# Patient Record
Sex: Female | Born: 1935
Health system: Southern US, Community
[De-identification: ages and names within clinical notes are randomized; demographics above are authoritative.]

## PROBLEM LIST (undated history)

## (undated) DIAGNOSIS — R4182 Altered mental status, unspecified: Principal | ICD-10-CM

## (undated) DIAGNOSIS — E119 Type 2 diabetes mellitus without complications: Secondary | ICD-10-CM

## (undated) DIAGNOSIS — R609 Edema, unspecified: Secondary | ICD-10-CM

## (undated) DIAGNOSIS — I442 Atrioventricular block, complete: Secondary | ICD-10-CM

## (undated) DIAGNOSIS — I639 Cerebral infarction, unspecified: Secondary | ICD-10-CM

## (undated) DIAGNOSIS — M1610 Unilateral primary osteoarthritis, unspecified hip: Secondary | ICD-10-CM

## (undated) DIAGNOSIS — F411 Generalized anxiety disorder: Secondary | ICD-10-CM

## (undated) DIAGNOSIS — R269 Unspecified abnormalities of gait and mobility: Secondary | ICD-10-CM

## (undated) DIAGNOSIS — Z950 Presence of cardiac pacemaker: Secondary | ICD-10-CM

## (undated) DIAGNOSIS — H9319 Tinnitus, unspecified ear: Secondary | ICD-10-CM

## (undated) DIAGNOSIS — N281 Cyst of kidney, acquired: Secondary | ICD-10-CM

## (undated) DIAGNOSIS — T18128A Food in esophagus causing other injury, initial encounter: Secondary | ICD-10-CM

## (undated) DIAGNOSIS — J301 Allergic rhinitis due to pollen: Secondary | ICD-10-CM

## (undated) DIAGNOSIS — E559 Vitamin D deficiency, unspecified: Secondary | ICD-10-CM

## (undated) DIAGNOSIS — M810 Age-related osteoporosis without current pathological fracture: Secondary | ICD-10-CM

## (undated) DIAGNOSIS — I10 Essential (primary) hypertension: Secondary | ICD-10-CM

## (undated) DIAGNOSIS — R300 Dysuria: Secondary | ICD-10-CM

## (undated) DIAGNOSIS — H3530 Unspecified macular degeneration: Secondary | ICD-10-CM

## (undated) DIAGNOSIS — E039 Hypothyroidism, unspecified: Secondary | ICD-10-CM

## (undated) DIAGNOSIS — M1990 Unspecified osteoarthritis, unspecified site: Secondary | ICD-10-CM

## (undated) DIAGNOSIS — E781 Pure hyperglyceridemia: Secondary | ICD-10-CM

## (undated) DIAGNOSIS — C50919 Malignant neoplasm of unspecified site of unspecified female breast: Secondary | ICD-10-CM

## (undated) DIAGNOSIS — Z860100 Personal history of colon polyps, unspecified: Secondary | ICD-10-CM

## (undated) DIAGNOSIS — I214 Non-ST elevation (NSTEMI) myocardial infarction: Secondary | ICD-10-CM

## (undated) DIAGNOSIS — B0052 Herpesviral keratitis: Secondary | ICD-10-CM

## (undated) DIAGNOSIS — H409 Unspecified glaucoma: Secondary | ICD-10-CM

## (undated) DIAGNOSIS — I2510 Atherosclerotic heart disease of native coronary artery without angina pectoris: Secondary | ICD-10-CM

## (undated) DIAGNOSIS — K224 Dyskinesia of esophagus: Secondary | ICD-10-CM

## (undated) DIAGNOSIS — R339 Retention of urine, unspecified: Secondary | ICD-10-CM

## (undated) DIAGNOSIS — J019 Acute sinusitis, unspecified: Secondary | ICD-10-CM

## (undated) HISTORY — DX: Acute sinusitis, unspecified: J01.90

## (undated) HISTORY — DX: Food in esophagus causing other injury, initial encounter: T18.128A

## (undated) HISTORY — DX: Unspecified abnormalities of gait and mobility: R26.9

## (undated) HISTORY — PX: INSERT / REPLACE / REMOVE PACEMAKER: SUR710

## (undated) HISTORY — DX: Personal history of colonic polyps: Z86.010

## (undated) HISTORY — DX: Unspecified macular degeneration: H35.30

## (undated) HISTORY — DX: Edema, unspecified: R60.9

## (undated) HISTORY — DX: Personal history of colon polyps, unspecified: Z86.0100

## (undated) HISTORY — DX: Type 2 diabetes mellitus without complications: E11.9

## (undated) HISTORY — DX: Cerebral infarction, unspecified: I63.9

## (undated) HISTORY — DX: Cyst of kidney, acquired: N28.1

## (undated) HISTORY — DX: Atherosclerotic heart disease of native coronary artery without angina pectoris: I25.10

## (undated) HISTORY — DX: Allergic rhinitis due to pollen: J30.1

## (undated) HISTORY — DX: Unspecified glaucoma: H40.9

## (undated) HISTORY — DX: Non-ST elevation (NSTEMI) myocardial infarction: I21.4

## (undated) HISTORY — DX: Age-related osteoporosis without current pathological fracture: M81.0

## (undated) HISTORY — DX: Presence of cardiac pacemaker: Z95.0

## (undated) HISTORY — DX: Hypothyroidism, unspecified: E03.9

## (undated) HISTORY — DX: Retention of urine, unspecified: R33.9

## (undated) HISTORY — DX: Dysuria: R30.0

## (undated) HISTORY — DX: Herpesviral keratitis: B00.52

## (undated) HISTORY — DX: Unspecified osteoarthritis, unspecified site: M19.90

## (undated) HISTORY — DX: Malignant neoplasm of unspecified site of unspecified female breast: C50.919

## (undated) HISTORY — DX: Dyskinesia of esophagus: K22.4

## (undated) HISTORY — DX: Pure hyperglyceridemia: E78.1

## (undated) HISTORY — DX: Essential (primary) hypertension: I10

## (undated) HISTORY — DX: Atrioventricular block, complete: I44.2

## (undated) HISTORY — DX: Unilateral primary osteoarthritis, unspecified hip: M16.10

## (undated) HISTORY — DX: Tinnitus, unspecified ear: H93.19

## (undated) HISTORY — DX: Generalized anxiety disorder: F41.1

## (undated) HISTORY — DX: Altered mental status, unspecified: R41.82

## (undated) HISTORY — DX: Vitamin D deficiency, unspecified: E55.9

---

## 1968-03-05 DIAGNOSIS — C50919 Malignant neoplasm of unspecified site of unspecified female breast: Secondary | ICD-10-CM

## 1968-03-05 HISTORY — PX: MASTECTOMY: SHX3

## 1968-03-05 HISTORY — DX: Malignant neoplasm of unspecified site of unspecified female breast: C50.919

## 2000-03-05 DIAGNOSIS — N281 Cyst of kidney, acquired: Secondary | ICD-10-CM

## 2000-03-05 HISTORY — DX: Cyst of kidney, acquired: N28.1

## 2001-03-05 DIAGNOSIS — I2510 Atherosclerotic heart disease of native coronary artery without angina pectoris: Secondary | ICD-10-CM

## 2001-03-05 DIAGNOSIS — R609 Edema, unspecified: Secondary | ICD-10-CM

## 2001-03-05 DIAGNOSIS — I442 Atrioventricular block, complete: Secondary | ICD-10-CM

## 2001-03-05 DIAGNOSIS — Z950 Presence of cardiac pacemaker: Secondary | ICD-10-CM

## 2001-03-05 DIAGNOSIS — M810 Age-related osteoporosis without current pathological fracture: Secondary | ICD-10-CM

## 2001-03-05 DIAGNOSIS — E119 Type 2 diabetes mellitus without complications: Secondary | ICD-10-CM

## 2001-03-05 HISTORY — DX: Presence of cardiac pacemaker: Z95.0

## 2001-03-05 HISTORY — DX: Type 2 diabetes mellitus without complications: E11.9

## 2001-03-05 HISTORY — DX: Age-related osteoporosis without current pathological fracture: M81.0

## 2001-03-05 HISTORY — DX: Edema, unspecified: R60.9

## 2001-03-05 HISTORY — DX: Atherosclerotic heart disease of native coronary artery without angina pectoris: I25.10

## 2001-03-05 HISTORY — DX: Atrioventricular block, complete: I44.2

## 2001-09-02 ENCOUNTER — Encounter: Payer: Self-pay | Admitting: *Deleted

## 2001-09-02 ENCOUNTER — Emergency Department (HOSPITAL_COMMUNITY): Admission: EM | Admit: 2001-09-02 | Discharge: 2001-09-02 | Payer: Self-pay | Admitting: *Deleted

## 2002-03-05 DIAGNOSIS — E781 Pure hyperglyceridemia: Secondary | ICD-10-CM

## 2002-03-05 HISTORY — DX: Pure hyperglyceridemia: E78.1

## 2005-03-05 DIAGNOSIS — H9319 Tinnitus, unspecified ear: Secondary | ICD-10-CM

## 2005-03-05 DIAGNOSIS — R269 Unspecified abnormalities of gait and mobility: Secondary | ICD-10-CM

## 2005-03-05 HISTORY — DX: Tinnitus, unspecified ear: H93.19

## 2005-03-05 HISTORY — DX: Unspecified abnormalities of gait and mobility: R26.9

## 2007-03-06 DIAGNOSIS — E559 Vitamin D deficiency, unspecified: Secondary | ICD-10-CM

## 2007-03-06 DIAGNOSIS — E039 Hypothyroidism, unspecified: Secondary | ICD-10-CM

## 2007-03-06 HISTORY — DX: Vitamin D deficiency, unspecified: E55.9

## 2007-03-06 HISTORY — DX: Hypothyroidism, unspecified: E03.9

## 2008-03-05 DIAGNOSIS — M1610 Unilateral primary osteoarthritis, unspecified hip: Secondary | ICD-10-CM

## 2008-03-05 HISTORY — DX: Unilateral primary osteoarthritis, unspecified hip: M16.10

## 2008-04-01 ENCOUNTER — Encounter: Admission: RE | Admit: 2008-04-01 | Discharge: 2008-04-01 | Payer: Self-pay | Admitting: Internal Medicine

## 2009-07-08 ENCOUNTER — Ambulatory Visit (HOSPITAL_COMMUNITY): Admission: RE | Admit: 2009-07-08 | Discharge: 2009-07-08 | Payer: Self-pay | Admitting: Cardiology

## 2010-03-05 DIAGNOSIS — H3530 Unspecified macular degeneration: Secondary | ICD-10-CM

## 2010-03-05 DIAGNOSIS — B0052 Herpesviral keratitis: Secondary | ICD-10-CM

## 2010-03-05 HISTORY — DX: Unspecified macular degeneration: H35.30

## 2010-03-05 HISTORY — DX: Herpesviral keratitis: B00.52

## 2010-05-23 LAB — SURGICAL PCR SCREEN
MRSA, PCR: NEGATIVE
Staphylococcus aureus: POSITIVE — AB

## 2010-05-23 LAB — GLUCOSE, CAPILLARY
Glucose-Capillary: 123 mg/dL — ABNORMAL HIGH (ref 70–99)
Glucose-Capillary: 128 mg/dL — ABNORMAL HIGH (ref 70–99)

## 2011-03-06 DIAGNOSIS — M1990 Unspecified osteoarthritis, unspecified site: Secondary | ICD-10-CM

## 2011-03-06 DIAGNOSIS — H409 Unspecified glaucoma: Secondary | ICD-10-CM

## 2011-03-06 HISTORY — DX: Unspecified glaucoma: H40.9

## 2011-03-06 HISTORY — DX: Unspecified osteoarthritis, unspecified site: M19.90

## 2011-03-21 DIAGNOSIS — B09 Unspecified viral infection characterized by skin and mucous membrane lesions: Secondary | ICD-10-CM | POA: Diagnosis not present

## 2011-03-21 DIAGNOSIS — H3530 Unspecified macular degeneration: Secondary | ICD-10-CM | POA: Diagnosis not present

## 2011-03-21 DIAGNOSIS — H04209 Unspecified epiphora, unspecified lacrimal gland: Secondary | ICD-10-CM | POA: Diagnosis not present

## 2011-04-26 DIAGNOSIS — I10 Essential (primary) hypertension: Secondary | ICD-10-CM | POA: Diagnosis not present

## 2011-04-26 DIAGNOSIS — I2510 Atherosclerotic heart disease of native coronary artery without angina pectoris: Secondary | ICD-10-CM | POA: Diagnosis not present

## 2011-04-26 DIAGNOSIS — Z79899 Other long term (current) drug therapy: Secondary | ICD-10-CM | POA: Diagnosis not present

## 2011-04-26 DIAGNOSIS — R609 Edema, unspecified: Secondary | ICD-10-CM | POA: Diagnosis not present

## 2011-04-26 DIAGNOSIS — E119 Type 2 diabetes mellitus without complications: Secondary | ICD-10-CM | POA: Diagnosis not present

## 2011-05-02 DIAGNOSIS — I10 Essential (primary) hypertension: Secondary | ICD-10-CM | POA: Diagnosis not present

## 2011-05-02 DIAGNOSIS — R5381 Other malaise: Secondary | ICD-10-CM | POA: Diagnosis not present

## 2011-05-02 DIAGNOSIS — E119 Type 2 diabetes mellitus without complications: Secondary | ICD-10-CM | POA: Diagnosis not present

## 2011-05-02 DIAGNOSIS — F411 Generalized anxiety disorder: Secondary | ICD-10-CM | POA: Diagnosis not present

## 2011-05-14 DIAGNOSIS — Z950 Presence of cardiac pacemaker: Secondary | ICD-10-CM | POA: Diagnosis not present

## 2011-05-14 DIAGNOSIS — I495 Sick sinus syndrome: Secondary | ICD-10-CM | POA: Diagnosis not present

## 2011-05-29 DIAGNOSIS — M2010 Hallux valgus (acquired), unspecified foot: Secondary | ICD-10-CM | POA: Diagnosis not present

## 2011-05-29 DIAGNOSIS — M2040 Other hammer toe(s) (acquired), unspecified foot: Secondary | ICD-10-CM | POA: Diagnosis not present

## 2011-06-05 DIAGNOSIS — Z1231 Encounter for screening mammogram for malignant neoplasm of breast: Secondary | ICD-10-CM | POA: Diagnosis not present

## 2011-06-19 DIAGNOSIS — H04569 Stenosis of unspecified lacrimal punctum: Secondary | ICD-10-CM | POA: Diagnosis not present

## 2011-06-19 DIAGNOSIS — H04562 Stenosis of left lacrimal punctum: Secondary | ICD-10-CM | POA: Insufficient documentation

## 2011-06-19 DIAGNOSIS — Z7982 Long term (current) use of aspirin: Secondary | ICD-10-CM | POA: Diagnosis not present

## 2011-06-19 DIAGNOSIS — H3530 Unspecified macular degeneration: Secondary | ICD-10-CM | POA: Diagnosis not present

## 2011-06-19 DIAGNOSIS — Z79899 Other long term (current) drug therapy: Secondary | ICD-10-CM | POA: Diagnosis not present

## 2011-06-19 DIAGNOSIS — H409 Unspecified glaucoma: Secondary | ICD-10-CM | POA: Diagnosis not present

## 2011-06-20 DIAGNOSIS — H353190 Nonexudative age-related macular degeneration, unspecified eye, stage unspecified: Secondary | ICD-10-CM | POA: Diagnosis not present

## 2011-06-20 DIAGNOSIS — H401490 Capsular glaucoma with pseudoexfoliation of lens, unspecified eye, stage unspecified: Secondary | ICD-10-CM | POA: Diagnosis not present

## 2011-06-20 DIAGNOSIS — Z961 Presence of intraocular lens: Secondary | ICD-10-CM | POA: Diagnosis not present

## 2011-07-26 DIAGNOSIS — E119 Type 2 diabetes mellitus without complications: Secondary | ICD-10-CM | POA: Diagnosis not present

## 2011-07-26 DIAGNOSIS — E785 Hyperlipidemia, unspecified: Secondary | ICD-10-CM | POA: Diagnosis not present

## 2011-08-01 DIAGNOSIS — R5381 Other malaise: Secondary | ICD-10-CM | POA: Diagnosis not present

## 2011-08-01 DIAGNOSIS — Z853 Personal history of malignant neoplasm of breast: Secondary | ICD-10-CM | POA: Diagnosis not present

## 2011-08-01 DIAGNOSIS — E119 Type 2 diabetes mellitus without complications: Secondary | ICD-10-CM | POA: Diagnosis not present

## 2011-08-01 DIAGNOSIS — F411 Generalized anxiety disorder: Secondary | ICD-10-CM | POA: Diagnosis not present

## 2011-08-01 DIAGNOSIS — E781 Pure hyperglyceridemia: Secondary | ICD-10-CM | POA: Diagnosis not present

## 2011-08-01 DIAGNOSIS — Z86010 Personal history of colonic polyps: Secondary | ICD-10-CM | POA: Diagnosis not present

## 2011-09-10 DIAGNOSIS — Z950 Presence of cardiac pacemaker: Secondary | ICD-10-CM | POA: Diagnosis not present

## 2011-09-11 DIAGNOSIS — B351 Tinea unguium: Secondary | ICD-10-CM | POA: Diagnosis not present

## 2011-09-11 DIAGNOSIS — E1159 Type 2 diabetes mellitus with other circulatory complications: Secondary | ICD-10-CM | POA: Diagnosis not present

## 2011-09-11 DIAGNOSIS — L851 Acquired keratosis [keratoderma] palmaris et plantaris: Secondary | ICD-10-CM | POA: Diagnosis not present

## 2011-10-23 DIAGNOSIS — E039 Hypothyroidism, unspecified: Secondary | ICD-10-CM | POA: Diagnosis not present

## 2011-10-23 DIAGNOSIS — E119 Type 2 diabetes mellitus without complications: Secondary | ICD-10-CM | POA: Diagnosis not present

## 2011-10-23 DIAGNOSIS — I10 Essential (primary) hypertension: Secondary | ICD-10-CM | POA: Diagnosis not present

## 2011-10-31 DIAGNOSIS — E119 Type 2 diabetes mellitus without complications: Secondary | ICD-10-CM | POA: Diagnosis not present

## 2011-10-31 DIAGNOSIS — M2550 Pain in unspecified joint: Secondary | ICD-10-CM | POA: Diagnosis not present

## 2011-10-31 DIAGNOSIS — F411 Generalized anxiety disorder: Secondary | ICD-10-CM | POA: Diagnosis not present

## 2011-10-31 DIAGNOSIS — I10 Essential (primary) hypertension: Secondary | ICD-10-CM | POA: Diagnosis not present

## 2011-11-02 ENCOUNTER — Ambulatory Visit
Admission: RE | Admit: 2011-11-02 | Discharge: 2011-11-02 | Disposition: A | Discharge status: Home / Self Care | Payer: Medicare Other | Source: Ambulatory Visit | Attending: Geriatric Medicine | Admitting: Geriatric Medicine

## 2011-11-02 ENCOUNTER — Other Ambulatory Visit: Payer: Self-pay | Admitting: Geriatric Medicine

## 2011-11-02 DIAGNOSIS — M1710 Unilateral primary osteoarthritis, unspecified knee: Secondary | ICD-10-CM | POA: Diagnosis not present

## 2011-11-02 DIAGNOSIS — M25562 Pain in left knee: Secondary | ICD-10-CM

## 2011-11-13 DIAGNOSIS — L608 Other nail disorders: Secondary | ICD-10-CM | POA: Diagnosis not present

## 2011-11-13 DIAGNOSIS — L851 Acquired keratosis [keratoderma] palmaris et plantaris: Secondary | ICD-10-CM | POA: Diagnosis not present

## 2011-11-13 DIAGNOSIS — E1159 Type 2 diabetes mellitus with other circulatory complications: Secondary | ICD-10-CM | POA: Diagnosis not present

## 2011-11-20 DIAGNOSIS — H401490 Capsular glaucoma with pseudoexfoliation of lens, unspecified eye, stage unspecified: Secondary | ICD-10-CM | POA: Diagnosis not present

## 2011-11-20 DIAGNOSIS — Z961 Presence of intraocular lens: Secondary | ICD-10-CM | POA: Diagnosis not present

## 2011-11-20 DIAGNOSIS — H353190 Nonexudative age-related macular degeneration, unspecified eye, stage unspecified: Secondary | ICD-10-CM | POA: Diagnosis not present

## 2011-11-27 DIAGNOSIS — R279 Unspecified lack of coordination: Secondary | ICD-10-CM | POA: Diagnosis not present

## 2011-11-27 DIAGNOSIS — M6281 Muscle weakness (generalized): Secondary | ICD-10-CM | POA: Diagnosis not present

## 2011-11-27 DIAGNOSIS — M159 Polyosteoarthritis, unspecified: Secondary | ICD-10-CM | POA: Diagnosis not present

## 2011-11-27 DIAGNOSIS — R269 Unspecified abnormalities of gait and mobility: Secondary | ICD-10-CM | POA: Diagnosis not present

## 2011-11-30 DIAGNOSIS — R279 Unspecified lack of coordination: Secondary | ICD-10-CM | POA: Diagnosis not present

## 2011-11-30 DIAGNOSIS — M159 Polyosteoarthritis, unspecified: Secondary | ICD-10-CM | POA: Diagnosis not present

## 2011-11-30 DIAGNOSIS — R269 Unspecified abnormalities of gait and mobility: Secondary | ICD-10-CM | POA: Diagnosis not present

## 2011-11-30 DIAGNOSIS — M6281 Muscle weakness (generalized): Secondary | ICD-10-CM | POA: Diagnosis not present

## 2011-12-03 DIAGNOSIS — R279 Unspecified lack of coordination: Secondary | ICD-10-CM | POA: Diagnosis not present

## 2011-12-03 DIAGNOSIS — M6281 Muscle weakness (generalized): Secondary | ICD-10-CM | POA: Diagnosis not present

## 2011-12-03 DIAGNOSIS — M159 Polyosteoarthritis, unspecified: Secondary | ICD-10-CM | POA: Diagnosis not present

## 2011-12-03 DIAGNOSIS — R269 Unspecified abnormalities of gait and mobility: Secondary | ICD-10-CM | POA: Diagnosis not present

## 2011-12-04 DIAGNOSIS — R279 Unspecified lack of coordination: Secondary | ICD-10-CM | POA: Diagnosis not present

## 2011-12-04 DIAGNOSIS — M159 Polyosteoarthritis, unspecified: Secondary | ICD-10-CM | POA: Diagnosis not present

## 2011-12-04 DIAGNOSIS — R269 Unspecified abnormalities of gait and mobility: Secondary | ICD-10-CM | POA: Diagnosis not present

## 2011-12-04 DIAGNOSIS — M6281 Muscle weakness (generalized): Secondary | ICD-10-CM | POA: Diagnosis not present

## 2011-12-06 DIAGNOSIS — R269 Unspecified abnormalities of gait and mobility: Secondary | ICD-10-CM | POA: Diagnosis not present

## 2011-12-06 DIAGNOSIS — M6281 Muscle weakness (generalized): Secondary | ICD-10-CM | POA: Diagnosis not present

## 2011-12-06 DIAGNOSIS — R279 Unspecified lack of coordination: Secondary | ICD-10-CM | POA: Diagnosis not present

## 2011-12-06 DIAGNOSIS — M159 Polyosteoarthritis, unspecified: Secondary | ICD-10-CM | POA: Diagnosis not present

## 2011-12-10 DIAGNOSIS — M6281 Muscle weakness (generalized): Secondary | ICD-10-CM | POA: Diagnosis not present

## 2011-12-10 DIAGNOSIS — R279 Unspecified lack of coordination: Secondary | ICD-10-CM | POA: Diagnosis not present

## 2011-12-10 DIAGNOSIS — M159 Polyosteoarthritis, unspecified: Secondary | ICD-10-CM | POA: Diagnosis not present

## 2011-12-10 DIAGNOSIS — R269 Unspecified abnormalities of gait and mobility: Secondary | ICD-10-CM | POA: Diagnosis not present

## 2011-12-11 DIAGNOSIS — I442 Atrioventricular block, complete: Secondary | ICD-10-CM | POA: Diagnosis not present

## 2011-12-11 DIAGNOSIS — I10 Essential (primary) hypertension: Secondary | ICD-10-CM | POA: Diagnosis not present

## 2011-12-11 DIAGNOSIS — R5381 Other malaise: Secondary | ICD-10-CM | POA: Diagnosis not present

## 2011-12-11 DIAGNOSIS — Z950 Presence of cardiac pacemaker: Secondary | ICD-10-CM | POA: Diagnosis not present

## 2011-12-11 DIAGNOSIS — R5383 Other fatigue: Secondary | ICD-10-CM | POA: Diagnosis not present

## 2011-12-11 DIAGNOSIS — E7800 Pure hypercholesterolemia, unspecified: Secondary | ICD-10-CM | POA: Diagnosis not present

## 2011-12-12 DIAGNOSIS — M6281 Muscle weakness (generalized): Secondary | ICD-10-CM | POA: Diagnosis not present

## 2011-12-12 DIAGNOSIS — M159 Polyosteoarthritis, unspecified: Secondary | ICD-10-CM | POA: Diagnosis not present

## 2011-12-12 DIAGNOSIS — R279 Unspecified lack of coordination: Secondary | ICD-10-CM | POA: Diagnosis not present

## 2011-12-12 DIAGNOSIS — R269 Unspecified abnormalities of gait and mobility: Secondary | ICD-10-CM | POA: Diagnosis not present

## 2011-12-14 DIAGNOSIS — M159 Polyosteoarthritis, unspecified: Secondary | ICD-10-CM | POA: Diagnosis not present

## 2011-12-14 DIAGNOSIS — R279 Unspecified lack of coordination: Secondary | ICD-10-CM | POA: Diagnosis not present

## 2011-12-14 DIAGNOSIS — M6281 Muscle weakness (generalized): Secondary | ICD-10-CM | POA: Diagnosis not present

## 2011-12-14 DIAGNOSIS — R269 Unspecified abnormalities of gait and mobility: Secondary | ICD-10-CM | POA: Diagnosis not present

## 2011-12-17 DIAGNOSIS — Z950 Presence of cardiac pacemaker: Secondary | ICD-10-CM | POA: Diagnosis not present

## 2011-12-17 DIAGNOSIS — I495 Sick sinus syndrome: Secondary | ICD-10-CM | POA: Diagnosis not present

## 2011-12-18 DIAGNOSIS — Z23 Encounter for immunization: Secondary | ICD-10-CM | POA: Diagnosis not present

## 2011-12-19 DIAGNOSIS — M159 Polyosteoarthritis, unspecified: Secondary | ICD-10-CM | POA: Diagnosis not present

## 2011-12-19 DIAGNOSIS — M6281 Muscle weakness (generalized): Secondary | ICD-10-CM | POA: Diagnosis not present

## 2011-12-19 DIAGNOSIS — R269 Unspecified abnormalities of gait and mobility: Secondary | ICD-10-CM | POA: Diagnosis not present

## 2011-12-19 DIAGNOSIS — R279 Unspecified lack of coordination: Secondary | ICD-10-CM | POA: Diagnosis not present

## 2011-12-21 DIAGNOSIS — H903 Sensorineural hearing loss, bilateral: Secondary | ICD-10-CM | POA: Diagnosis not present

## 2012-01-15 DIAGNOSIS — L608 Other nail disorders: Secondary | ICD-10-CM | POA: Diagnosis not present

## 2012-01-15 DIAGNOSIS — E1159 Type 2 diabetes mellitus with other circulatory complications: Secondary | ICD-10-CM | POA: Diagnosis not present

## 2012-01-15 DIAGNOSIS — L851 Acquired keratosis [keratoderma] palmaris et plantaris: Secondary | ICD-10-CM | POA: Diagnosis not present

## 2012-01-22 DIAGNOSIS — E039 Hypothyroidism, unspecified: Secondary | ICD-10-CM | POA: Diagnosis not present

## 2012-01-22 DIAGNOSIS — E119 Type 2 diabetes mellitus without complications: Secondary | ICD-10-CM | POA: Diagnosis not present

## 2012-01-30 DIAGNOSIS — E039 Hypothyroidism, unspecified: Secondary | ICD-10-CM | POA: Diagnosis not present

## 2012-01-30 DIAGNOSIS — E785 Hyperlipidemia, unspecified: Secondary | ICD-10-CM | POA: Diagnosis not present

## 2012-01-30 DIAGNOSIS — M1990 Unspecified osteoarthritis, unspecified site: Secondary | ICD-10-CM | POA: Diagnosis not present

## 2012-01-30 DIAGNOSIS — E119 Type 2 diabetes mellitus without complications: Secondary | ICD-10-CM | POA: Diagnosis not present

## 2012-03-18 DIAGNOSIS — E1159 Type 2 diabetes mellitus with other circulatory complications: Secondary | ICD-10-CM | POA: Diagnosis not present

## 2012-03-18 DIAGNOSIS — L608 Other nail disorders: Secondary | ICD-10-CM | POA: Diagnosis not present

## 2012-03-18 DIAGNOSIS — L851 Acquired keratosis [keratoderma] palmaris et plantaris: Secondary | ICD-10-CM | POA: Diagnosis not present

## 2012-04-08 DIAGNOSIS — Z950 Presence of cardiac pacemaker: Secondary | ICD-10-CM | POA: Diagnosis not present

## 2012-04-15 DIAGNOSIS — F411 Generalized anxiety disorder: Secondary | ICD-10-CM | POA: Diagnosis not present

## 2012-04-15 DIAGNOSIS — E119 Type 2 diabetes mellitus without complications: Secondary | ICD-10-CM | POA: Diagnosis not present

## 2012-04-15 DIAGNOSIS — E039 Hypothyroidism, unspecified: Secondary | ICD-10-CM | POA: Diagnosis not present

## 2012-04-15 DIAGNOSIS — Z79899 Other long term (current) drug therapy: Secondary | ICD-10-CM | POA: Diagnosis not present

## 2012-04-15 DIAGNOSIS — I10 Essential (primary) hypertension: Secondary | ICD-10-CM | POA: Diagnosis not present

## 2012-04-30 DIAGNOSIS — E119 Type 2 diabetes mellitus without complications: Secondary | ICD-10-CM | POA: Diagnosis not present

## 2012-04-30 DIAGNOSIS — E781 Pure hyperglyceridemia: Secondary | ICD-10-CM | POA: Diagnosis not present

## 2012-04-30 DIAGNOSIS — I10 Essential (primary) hypertension: Secondary | ICD-10-CM | POA: Diagnosis not present

## 2012-04-30 DIAGNOSIS — M1990 Unspecified osteoarthritis, unspecified site: Secondary | ICD-10-CM | POA: Diagnosis not present

## 2012-04-30 DIAGNOSIS — E039 Hypothyroidism, unspecified: Secondary | ICD-10-CM | POA: Diagnosis not present

## 2012-05-20 DIAGNOSIS — L608 Other nail disorders: Secondary | ICD-10-CM | POA: Diagnosis not present

## 2012-05-20 DIAGNOSIS — E1159 Type 2 diabetes mellitus with other circulatory complications: Secondary | ICD-10-CM | POA: Diagnosis not present

## 2012-05-20 DIAGNOSIS — L851 Acquired keratosis [keratoderma] palmaris et plantaris: Secondary | ICD-10-CM | POA: Diagnosis not present

## 2012-05-28 DIAGNOSIS — H353190 Nonexudative age-related macular degeneration, unspecified eye, stage unspecified: Secondary | ICD-10-CM | POA: Diagnosis not present

## 2012-05-28 DIAGNOSIS — Z961 Presence of intraocular lens: Secondary | ICD-10-CM | POA: Diagnosis not present

## 2012-06-26 DIAGNOSIS — H353190 Nonexudative age-related macular degeneration, unspecified eye, stage unspecified: Secondary | ICD-10-CM | POA: Diagnosis not present

## 2012-06-26 DIAGNOSIS — H33009 Unspecified retinal detachment with retinal break, unspecified eye: Secondary | ICD-10-CM | POA: Diagnosis not present

## 2012-06-26 DIAGNOSIS — H43819 Vitreous degeneration, unspecified eye: Secondary | ICD-10-CM | POA: Diagnosis not present

## 2012-06-28 ENCOUNTER — Encounter (HOSPITAL_COMMUNITY): Payer: Self-pay | Admitting: Family Medicine

## 2012-06-28 ENCOUNTER — Inpatient Hospital Stay (HOSPITAL_COMMUNITY)
Admission: EM | Admit: 2012-06-28 | Discharge: 2012-07-03 | DRG: 064 | Disposition: A | Discharge status: Skilled Nursing Facility | Payer: Medicare Other | Attending: Family Medicine | Admitting: Family Medicine

## 2012-06-28 ENCOUNTER — Emergency Department (HOSPITAL_COMMUNITY): Payer: Medicare Other

## 2012-06-28 DIAGNOSIS — G9341 Metabolic encephalopathy: Secondary | ICD-10-CM

## 2012-06-28 DIAGNOSIS — Z79899 Other long term (current) drug therapy: Secondary | ICD-10-CM | POA: Diagnosis not present

## 2012-06-28 DIAGNOSIS — G459 Transient cerebral ischemic attack, unspecified: Secondary | ICD-10-CM | POA: Diagnosis not present

## 2012-06-28 DIAGNOSIS — I214 Non-ST elevation (NSTEMI) myocardial infarction: Secondary | ICD-10-CM | POA: Diagnosis not present

## 2012-06-28 DIAGNOSIS — Z9010 Acquired absence of unspecified breast and nipple: Secondary | ICD-10-CM

## 2012-06-28 DIAGNOSIS — E039 Hypothyroidism, unspecified: Secondary | ICD-10-CM | POA: Diagnosis not present

## 2012-06-28 DIAGNOSIS — S0993XA Unspecified injury of face, initial encounter: Secondary | ICD-10-CM | POA: Diagnosis not present

## 2012-06-28 DIAGNOSIS — N390 Urinary tract infection, site not specified: Secondary | ICD-10-CM

## 2012-06-28 DIAGNOSIS — R1310 Dysphagia, unspecified: Secondary | ICD-10-CM | POA: Diagnosis present

## 2012-06-28 DIAGNOSIS — Z950 Presence of cardiac pacemaker: Secondary | ICD-10-CM

## 2012-06-28 DIAGNOSIS — I248 Other forms of acute ischemic heart disease: Secondary | ICD-10-CM | POA: Diagnosis not present

## 2012-06-28 DIAGNOSIS — R7989 Other specified abnormal findings of blood chemistry: Secondary | ICD-10-CM

## 2012-06-28 DIAGNOSIS — A498 Other bacterial infections of unspecified site: Secondary | ICD-10-CM | POA: Diagnosis present

## 2012-06-28 DIAGNOSIS — R4701 Aphasia: Secondary | ICD-10-CM | POA: Diagnosis not present

## 2012-06-28 DIAGNOSIS — Z66 Do not resuscitate: Secondary | ICD-10-CM | POA: Diagnosis present

## 2012-06-28 DIAGNOSIS — I10 Essential (primary) hypertension: Secondary | ICD-10-CM

## 2012-06-28 DIAGNOSIS — G929 Unspecified toxic encephalopathy: Secondary | ICD-10-CM | POA: Diagnosis present

## 2012-06-28 DIAGNOSIS — E785 Hyperlipidemia, unspecified: Secondary | ICD-10-CM

## 2012-06-28 DIAGNOSIS — F3289 Other specified depressive episodes: Secondary | ICD-10-CM | POA: Diagnosis present

## 2012-06-28 DIAGNOSIS — G9340 Encephalopathy, unspecified: Secondary | ICD-10-CM | POA: Diagnosis not present

## 2012-06-28 DIAGNOSIS — E119 Type 2 diabetes mellitus without complications: Secondary | ICD-10-CM | POA: Diagnosis present

## 2012-06-28 DIAGNOSIS — E7800 Pure hypercholesterolemia, unspecified: Secondary | ICD-10-CM | POA: Diagnosis present

## 2012-06-28 DIAGNOSIS — I639 Cerebral infarction, unspecified: Secondary | ICD-10-CM

## 2012-06-28 DIAGNOSIS — G92 Toxic encephalopathy: Secondary | ICD-10-CM | POA: Diagnosis not present

## 2012-06-28 DIAGNOSIS — I2489 Other forms of acute ischemic heart disease: Secondary | ICD-10-CM | POA: Diagnosis present

## 2012-06-28 DIAGNOSIS — I6350 Cerebral infarction due to unspecified occlusion or stenosis of unspecified cerebral artery: Secondary | ICD-10-CM | POA: Diagnosis not present

## 2012-06-28 DIAGNOSIS — R4182 Altered mental status, unspecified: Secondary | ICD-10-CM | POA: Diagnosis not present

## 2012-06-28 DIAGNOSIS — C50919 Malignant neoplasm of unspecified site of unspecified female breast: Secondary | ICD-10-CM | POA: Diagnosis present

## 2012-06-28 DIAGNOSIS — K5900 Constipation, unspecified: Secondary | ICD-10-CM | POA: Diagnosis present

## 2012-06-28 DIAGNOSIS — R5381 Other malaise: Secondary | ICD-10-CM | POA: Diagnosis not present

## 2012-06-28 DIAGNOSIS — W19XXXA Unspecified fall, initial encounter: Secondary | ICD-10-CM

## 2012-06-28 DIAGNOSIS — Z7902 Long term (current) use of antithrombotics/antiplatelets: Secondary | ICD-10-CM | POA: Diagnosis not present

## 2012-06-28 DIAGNOSIS — G8190 Hemiplegia, unspecified affecting unspecified side: Secondary | ICD-10-CM | POA: Diagnosis not present

## 2012-06-28 DIAGNOSIS — F329 Major depressive disorder, single episode, unspecified: Secondary | ICD-10-CM | POA: Diagnosis present

## 2012-06-28 DIAGNOSIS — I6789 Other cerebrovascular disease: Secondary | ICD-10-CM | POA: Diagnosis not present

## 2012-06-28 DIAGNOSIS — Z853 Personal history of malignant neoplasm of breast: Secondary | ICD-10-CM | POA: Diagnosis not present

## 2012-06-28 DIAGNOSIS — S0990XA Unspecified injury of head, initial encounter: Secondary | ICD-10-CM | POA: Diagnosis not present

## 2012-06-28 DIAGNOSIS — R404 Transient alteration of awareness: Secondary | ICD-10-CM | POA: Diagnosis not present

## 2012-06-28 DIAGNOSIS — S298XXA Other specified injuries of thorax, initial encounter: Secondary | ICD-10-CM | POA: Diagnosis not present

## 2012-06-28 DIAGNOSIS — R51 Headache: Secondary | ICD-10-CM | POA: Diagnosis not present

## 2012-06-28 DIAGNOSIS — R05 Cough: Secondary | ICD-10-CM | POA: Diagnosis not present

## 2012-06-28 DIAGNOSIS — R748 Abnormal levels of other serum enzymes: Secondary | ICD-10-CM | POA: Diagnosis not present

## 2012-06-28 HISTORY — DX: Cerebral infarction, unspecified: I63.9

## 2012-06-28 HISTORY — DX: Non-ST elevation (NSTEMI) myocardial infarction: I21.4

## 2012-06-28 HISTORY — DX: Essential (primary) hypertension: I10

## 2012-06-28 LAB — CBC WITH DIFFERENTIAL/PLATELET
Basophils Absolute: 0 10*3/uL (ref 0.0–0.1)
Basophils Relative: 0 % (ref 0–1)
Eosinophils Relative: 0 % (ref 0–5)
HCT: 39.4 % (ref 36.0–46.0)
MCH: 31.1 pg (ref 26.0–34.0)
MCHC: 34.8 g/dL (ref 30.0–36.0)
MCV: 89.5 fL (ref 78.0–100.0)
Monocytes Absolute: 0.4 10*3/uL (ref 0.1–1.0)
RDW: 12.8 % (ref 11.5–15.5)

## 2012-06-28 LAB — URINE MICROSCOPIC-ADD ON

## 2012-06-28 LAB — URINALYSIS, ROUTINE W REFLEX MICROSCOPIC
Bilirubin Urine: NEGATIVE
Glucose, UA: NEGATIVE mg/dL
Ketones, ur: NEGATIVE mg/dL
Protein, ur: NEGATIVE mg/dL

## 2012-06-28 LAB — BASIC METABOLIC PANEL
Calcium: 9.3 mg/dL (ref 8.4–10.5)
Creatinine, Ser: 0.59 mg/dL (ref 0.50–1.10)
GFR calc Af Amer: >90 mL/min (ref >90)

## 2012-06-28 LAB — TROPONIN I: Troponin I: 0.75 ng/mL (ref <0.30)

## 2012-06-28 MED ORDER — NIACIN ER 500 MG PO CPCR
1000.0000 mg | ORAL_CAPSULE | Freq: Every day | ORAL | Status: DC
  Administered 2012-06-28: 1000 mg via ORAL
  Filled 2012-06-28 (×2): qty 2

## 2012-06-28 MED ORDER — LEVOTHYROXINE SODIUM 75 MCG PO TABS
75.0000 ug | ORAL_TABLET | Freq: Every day | ORAL | Status: DC
  Administered 2012-06-29: 75 ug via ORAL
  Filled 2012-06-28 (×2): qty 1

## 2012-06-28 MED ORDER — ALBUTEROL SULFATE (5 MG/ML) 0.5% IN NEBU: 2.5000 mg | INHALATION_SOLUTION | RESPIRATORY_TRACT | Status: DC | PRN

## 2012-06-28 MED ORDER — LORATADINE 10 MG PO TABS
10.0000 mg | ORAL_TABLET | Freq: Every day | ORAL | Status: DC | PRN
  Filled 2012-06-28: qty 1

## 2012-06-28 MED ORDER — ENOXAPARIN SODIUM 40 MG/0.4ML ~~LOC~~ SOLN
40.0000 mg | SUBCUTANEOUS | Status: DC
  Administered 2012-06-28 – 2012-07-02 (×5): 40 mg via SUBCUTANEOUS
  Filled 2012-06-28 (×6): qty 0.4

## 2012-06-28 MED ORDER — SODIUM CHLORIDE 0.9 % IV SOLN
INTRAVENOUS | Status: DC
Start: 2012-06-28 — End: 2012-06-29
  Administered 2012-06-28: 19:00:00 via INTRAVENOUS

## 2012-06-28 MED ORDER — POTASSIUM CHLORIDE ER 10 MEQ PO TBCR
10.0000 meq | EXTENDED_RELEASE_TABLET | Freq: Every day | ORAL | Status: DC
  Administered 2012-06-28: 10 meq via ORAL
  Filled 2012-06-28 (×2): qty 1

## 2012-06-28 MED ORDER — ADULT MULTIVITAMIN W/MINERALS CH
1.0000 | ORAL_TABLET | Freq: Every day | ORAL | Status: DC
  Administered 2012-06-28: 1 via ORAL
  Filled 2012-06-28 (×2): qty 1

## 2012-06-28 MED ORDER — SODIUM CHLORIDE 0.9 % IJ SOLN: 3.0000 mL | Freq: Two times a day (BID) | INTRAMUSCULAR | Status: DC

## 2012-06-28 MED ORDER — ONDANSETRON HCL 4 MG/2ML IJ SOLN: 4.0000 mg | Freq: Four times a day (QID) | INTRAMUSCULAR | Status: DC | PRN

## 2012-06-28 MED ORDER — ACETAMINOPHEN 325 MG PO TABS: 650.0000 mg | ORAL_TABLET | Freq: Four times a day (QID) | ORAL | Status: DC | PRN

## 2012-06-28 MED ORDER — METOPROLOL SUCCINATE ER 100 MG PO TB24
100.0000 mg | ORAL_TABLET | Freq: Every day | ORAL | Status: DC
  Administered 2012-06-28: 100 mg via ORAL
  Filled 2012-06-28 (×2): qty 1

## 2012-06-28 MED ORDER — ESCITALOPRAM OXALATE 20 MG PO TABS
20.0000 mg | ORAL_TABLET | Freq: Every day | ORAL | Status: DC
  Administered 2012-06-28: 20 mg via ORAL
  Filled 2012-06-28 (×2): qty 1

## 2012-06-28 MED ORDER — ONDANSETRON HCL 4 MG PO TABS: 4.0000 mg | ORAL_TABLET | Freq: Four times a day (QID) | ORAL | Status: DC | PRN

## 2012-06-28 MED ORDER — FENOFIBRATE 160 MG PO TABS
160.0000 mg | ORAL_TABLET | Freq: Every day | ORAL | Status: DC
  Administered 2012-06-28: 160 mg via ORAL
  Filled 2012-06-28 (×2): qty 1

## 2012-06-28 MED ORDER — OCUVITE PO TABS
2.0000 | ORAL_TABLET | Freq: Every day | ORAL | Status: DC
  Administered 2012-06-28: 2 via ORAL
  Filled 2012-06-28 (×2): qty 2

## 2012-06-28 MED ORDER — LOSARTAN POTASSIUM 50 MG PO TABS
50.0000 mg | ORAL_TABLET | Freq: Every day | ORAL | Status: DC
  Administered 2012-06-28: 50 mg via ORAL
  Filled 2012-06-28 (×2): qty 1

## 2012-06-28 MED ORDER — CEFTRIAXONE SODIUM 1 G IJ SOLR
1.0000 g | INTRAMUSCULAR | Status: DC
  Filled 2012-06-28: qty 10

## 2012-06-28 MED ORDER — DEXTROSE 5 % IV SOLN
1.0000 g | Freq: Once | INTRAVENOUS | Status: AC
  Administered 2012-06-28: 1 g via INTRAVENOUS
  Filled 2012-06-28: qty 10

## 2012-06-28 MED ORDER — ACETAMINOPHEN 650 MG RE SUPP: 650.0000 mg | Freq: Four times a day (QID) | RECTAL | Status: DC | PRN

## 2012-06-28 MED ORDER — FUROSEMIDE 20 MG PO TABS
20.0000 mg | ORAL_TABLET | ORAL | Status: DC
  Filled 2012-06-28: qty 1

## 2012-06-28 MED ORDER — VITAMIN D (ERGOCALCIFEROL) 1.25 MG (50000 UNIT) PO CAPS: 50000.0000 [IU] | ORAL_CAPSULE | ORAL | Status: DC

## 2012-06-28 MED ORDER — ALUM & MAG HYDROXIDE-SIMETH 200-200-20 MG/5ML PO SUSP: 30.0000 mL | Freq: Four times a day (QID) | ORAL | Status: DC | PRN

## 2012-06-28 MED ORDER — ASPIRIN EC 325 MG PO TBEC
325.0000 mg | DELAYED_RELEASE_TABLET | Freq: Every day | ORAL | Status: DC
  Administered 2012-06-28: 325 mg via ORAL
  Filled 2012-06-28 (×2): qty 1

## 2012-06-28 NOTE — Progress Notes (Signed)
CRITICAL VALUE ALERT  Critical value received:  Troponin 1.91  Date of notification:  06/28/12  Time of notification:  1820  Critical value read back:yes  Nurse who received alert:  Dorann Lodge  MD notified (1st page): [place]  Time of first page:  1900  MD notified (2nd page):  Time of second page:  Responding MD:  Bretta Bang  Time MD responded:  1900

## 2012-06-28 NOTE — ED Provider Notes (Signed)
History     CSN: [id number]  Arrival date & time 06/28/12  1159   First MD Initiated Contact with Patient 06/28/12 1233      Chief Complaint  Patient presents with  . Fall  level 5 caveat due to altered mental status.   (Consider location/radiation/quality/duration/timing/severity/associated sxs/prior treatment) Patient is a 79 y.o. female presenting with fall. The history is provided by the patient and a relative.  Fall Associated symptoms include headaches.   patient presents to the ER after being found naked on the floor by her bed.  Son-in-law is here and states that she is not at her baseline. she is only complaining of a headache but is confused she is normally relatively high functioning..    Past Medical History  Diagnosis Date  . Cancer   . Thyroid disease   . Hypertension   . High cholesterol     Past Surgical History  Procedure Laterality Date  . Mastectomy      History reviewed. No pertinent family history.  History  Substance Use Topics  . Smoking status: Never Smoker   . Smokeless tobacco: Not on file  . Alcohol Use: No    OB History   Grav Para Term Preterm Abortions TAB SAB Ect Mult Living                  Review of Systems  Unable to perform ROS Neurological: Positive for headaches.    Allergies  Review of patient's allergies indicates no known allergies.  Home Medications   Current Outpatient Rx  Name  Route  Sig  Dispense  Refill  . acetaminophen (TYLENOL) 650 MG CR tablet   Oral   Take 650 mg by mouth 2 (two) times daily.         Marland Kitchen aspirin EC 81 MG tablet   Oral   Take 81 mg by mouth daily.         . beta carotene w/minerals (OCUVITE) tablet   Oral   Take 2 tablets by mouth daily.         . Calcium Carbonate-Vitamin D (CALCIUM 600 + D PO)   Oral   Take 1 tablet by mouth 2 (two) times daily.         . cetirizine (ZYRTEC) 10 MG tablet   Oral   Take 10 mg by mouth daily as needed for allergies.         Marland Kitchen  escitalopram (LEXAPRO) 20 MG tablet   Oral   Take 20 mg by mouth daily.         . fenofibrate (TRICOR) 145 MG tablet   Oral   Take 145 mg by mouth daily.         . furosemide (LASIX) 40 MG tablet   Oral   Take 20 mg by mouth every other day.         . levothyroxine (SYNTHROID, LEVOTHROID) 75 MCG tablet   Oral   Take 75 mcg by mouth daily before breakfast.         . losartan (COZAAR) 50 MG tablet   Oral   Take 50 mg by mouth daily.         . metoprolol succinate (TOPROL-XL) 100 MG 24 hr tablet   Oral   Take 100 mg by mouth daily. Take with or immediately following a meal.         . Multiple Vitamin (MULTIVITAMIN WITH MINERALS) TABS   Oral   Take 1 tablet by  mouth daily.         . niacin 500 MG CR capsule   Oral   Take 1,000 mg by mouth at bedtime.         . Omega-3 Fatty Acids (FISH OIL PO)   Oral   Take 1 capsule by mouth daily.         . potassium chloride (K-DUR) 10 MEQ tablet   Oral   Take 10 mEq by mouth daily.         . Vitamin D, Ergocalciferol, (DRISDOL) 50000 UNITS CAPS   Oral   Take 50,000 Units by mouth every 7 (seven) days.           BP 127/86  Pulse 97  Temp(Src) 98.3 F (36.8 C)  Resp 24  SpO2 94%  Physical Exam  Constitutional: She appears well-developed and well-nourished.  HENT:  Head: Normocephalic.  Eyes: EOM are normal. Pupils are equal, round, and reactive to light.  Neck: Normal range of motion. Neck supple.  Cardiovascular: Normal rate and regular rhythm.   Pulmonary/Chest: Effort normal and breath sounds normal.  Abdominal: Soft. Bowel sounds are normal.  Musculoskeletal: Normal range of motion. She exhibits edema.  Mild edema.  Neurological: She is alert.  Mild confusion. Has difficulty answering questions, but is able to identify her son in law.   Skin: Skin is warm.    ED Course  Procedures (including critical care time)  Labs Reviewed  CBC WITH DIFFERENTIAL - Abnormal; Notable for the following:     Neutrophils Relative 80 (*)    Neutro Abs 8.0 (*)    All other components within normal limits  BASIC METABOLIC PANEL - Abnormal; Notable for the following:    Glucose, Bld 149 (*)    GFR calc non Af Amer 78 (*)    All other components within normal limits  URINALYSIS, ROUTINE W REFLEX MICROSCOPIC - Abnormal; Notable for the following:    Hgb urine dipstick SMALL (*)    Nitrite POSITIVE (*)    Leukocytes, UA SMALL (*)    All other components within normal limits  URINE MICROSCOPIC-ADD ON - Abnormal; Notable for the following:    Bacteria, UA FEW (*)    All other components within normal limits  POCT I-STAT TROPONIN I - Abnormal; Notable for the following:    Troponin i, poc 0.68 (*)    All other components within normal limits  URINE CULTURE  TROPONIN I   Dg Chest 2 View  06/28/2012  *RADIOLOGY REPORT*  Clinical Data: Fall with altered mental status.  CHEST - 2 VIEW  Comparison: 07/05/2009 chest radiograph  Findings: Cardiomegaly and left-sided pacemaker again noted. Left basilar scarring is again identified. There is no evidence of focal airspace disease, pulmonary edema, suspicious pulmonary nodule/mass, pleural effusion, or pneumothorax. No acute bony abnormalities are identified. A thoracic kyphosis is again visualized.  IMPRESSION: Cardiomegaly without evidence of acute cardiopulmonary disease.   Original Report Authenticated By: Harmon Pier, M.D.    Ct Head Wo Contrast  06/28/2012  *RADIOLOGY REPORT*  Clinical Data:  Fall.  Altered mental status  CT HEAD WITHOUT CONTRAST CT CERVICAL SPINE WITHOUT CONTRAST  Technique:  Multidetector CT imaging of the head and cervical spine was performed following the standard protocol without intravenous contrast.  Multiplanar CT image reconstructions of the cervical spine were also generated.  Comparison:  CT 03/21/2005  CT HEAD  Findings:  Age appropriate mild atrophy.  Normal brain volume for age.  Minimal chronic microvascular  ischemia in the  white matter. No acute infarct.  Negative for hemorrhage mass or skull fracture.  IMPRESSION: No acute abnormality.  CT CERVICAL SPINE  Findings: Thoracic scoliosis.  Normal sagittal alignment.  Mild disc degeneration.  There is facet degeneration, left greater than right at C4-5 and C5-6.  Negative for fracture.  IMPRESSION: Mild degenerative change.  Negative for fracture.   Original Report Authenticated By: Janeece Riggers, M.D.    Ct Cervical Spine Wo Contrast  06/28/2012  *RADIOLOGY REPORT*  Clinical Data:  Fall.  Altered mental status  CT HEAD WITHOUT CONTRAST CT CERVICAL SPINE WITHOUT CONTRAST  Technique:  Multidetector CT imaging of the head and cervical spine was performed following the standard protocol without intravenous contrast.  Multiplanar CT image reconstructions of the cervical spine were also generated.  Comparison:  CT 03/21/2005  CT HEAD  Findings:  Age appropriate mild atrophy.  Normal brain volume for age.  Minimal chronic microvascular ischemia in the white matter. No acute infarct.  Negative for hemorrhage mass or skull fracture.  IMPRESSION: No acute abnormality.  CT CERVICAL SPINE  Findings: Thoracic scoliosis.  Normal sagittal alignment.  Mild disc degeneration.  There is facet degeneration, left greater than right at C4-5 and C5-6.  Negative for fracture.  IMPRESSION: Mild degenerative change.  Negative for fracture.   Original Report Authenticated By: Janeece Riggers, M.D.      1. UTI (lower urinary tract infection)   2. Fall, initial encounter   3. Elevated troponin   4. Altered mental status      Date: 06/28/2012  Rate: 95  Rhythm: paced  QRS Axis: normal  Intervals: normal  ST/T Wave abnormalities: paced  Conduction Disutrbances:paced  Narrative Interpretation:   Old EKG Reviewed: none available    MDM  Patient presents with possible fall and altered mental status. She was found on the floor naked. EKG appears to be paced, and she has a mildly elevated troponin. She  denies chest pain but is confused. She has a urinary tract infection. Imaging is reassuring. Will be admitted to internal medicine       Juliet Rude. Rubin Payor, MD 06/28/12 1544

## 2012-06-28 NOTE — Consult Note (Addendum)
Cardiology Consult Note   Patient ID: Rachel Cooke MRN: [id number], DOB/AGE: 77-Sep-1921   Admit date: 06/28/2012 Date of Consult: 06/28/2012  Primary Physician: No primary provider on file. Primary Cardiologist: Historical (2011)-Dr. [facility]  Reason for consult:  Positive Cardiac Biomarker (Troponin I rising to 1.91)  HPI: KHRISTIE Cooke is a 79 y.o. female with PMHx of complete AVHB s/p dual chamber PPM (s/p generator change out by Dr. Amil Amen in 2011), HTN, HLD, hypothyroidism, h/o breast CA s/p mastectomy and resides in an independent living facility who presents to [facility] ED this afternoon due to acute change in mentation; patient was in her baseline mental status of full orientation as of yesterday.  She was last seen/heard from the night before (4/25) on a >2 hr phone conversation with her daughter in [place] and was at her baseline fully oriented state at that time.  The clinical history was gathered from the patient's daughter and son-in-law (Al) who was present at the time of admission as patient was unable to provide reliable history at that time.  Per report, this AM [name] was found on the floor and noted to be confused at the living facility.  Patient was unable to provide answers to basic questions as she was quite somnolent on my interview; she reportedly denied CP/shortness of breath on admission to the primary intake physician.  She is however overall confused as to time and place although in no distress.  On arrival to the [facility] ED she underwent heat CT WO contrast which demonstrated no acute intracranial abnormality.  Initial labwork showed WBC=10,000 with a slight left shift (neutrophils 80% on differential), stable H/H, PLT wnl and GFR>60.  Initial ECG showed a paced-rhythm (LBBB pattern) and troponin I POC was 0.68 with repeat serum Tn I of 1.91 at 15:32 followed by a repeat serum Tn I of 0.75 at 16:40.  UA is suggestive of possible acute cystitis/UTI but  cultures are pending.  [facility] is consulted for inpatient assessment given the positive cardiac biomarkers.  The patient's daughter was clear to indicate that the family does not desire any invasive interventions but consent to full course of medical treatment otherwise; patient is DNR.    Problem List: Past Medical History  Diagnosis Date  . Cancer   . Thyroid disease   . Hypertension   . High cholesterol     Past Surgical History  Procedure Laterality Date  . Mastectomy       Allergies: No Known Allergies  Home Medications: Prior to Admission medications   Medication Sig Start Date End Date Taking? Authorizing Provider  acetaminophen (TYLENOL) 650 MG CR tablet Take 650 mg by mouth 2 (two) times daily.   Yes Historical Provider, MD  aspirin EC 81 MG tablet Take 81 mg by mouth daily.   Yes Historical Provider, MD  beta carotene w/minerals (OCUVITE) tablet Take 2 tablets by mouth daily.   Yes Historical Provider, MD  Calcium Carbonate-Vitamin D (CALCIUM 600 + D PO) Take 1 tablet by mouth 2 (two) times daily.   Yes Historical Provider, MD  cetirizine (ZYRTEC) 10 MG tablet Take 10 mg by mouth daily as needed for allergies.   Yes Historical Provider, MD  escitalopram (LEXAPRO) 20 MG tablet Take 20 mg by mouth daily.   Yes Historical Provider, MD  fenofibrate (TRICOR) 145 MG tablet Take 145 mg by mouth daily.   Yes Historical Provider, MD  furosemide (LASIX) 40 MG tablet Take 20 mg  by mouth every other day.   Yes Historical Provider, MD  levothyroxine (SYNTHROID, LEVOTHROID) 75 MCG tablet Take 75 mcg by mouth daily before breakfast.   Yes Historical Provider, MD  losartan (COZAAR) 50 MG tablet Take 50 mg by mouth daily.   Yes Historical Provider, MD  metoprolol succinate (TOPROL-XL) 100 MG 24 hr tablet Take 100 mg by mouth daily. Take with or immediately following a meal.   Yes Historical Provider, MD  Multiple Vitamin (MULTIVITAMIN WITH MINERALS) TABS Take 1 tablet by mouth  daily.   Yes Historical Provider, MD  niacin 500 MG CR capsule Take 1,000 mg by mouth at bedtime.   Yes Historical Provider, MD  Omega-3 Fatty Acids (FISH OIL PO) Take 1 capsule by mouth daily.   Yes Historical Provider, MD  potassium chloride (K-DUR) 10 MEQ tablet Take 10 mEq by mouth daily.   Yes Historical Provider, MD  Vitamin D, Ergocalciferol, (DRISDOL) 50000 UNITS CAPS Take 50,000 Units by mouth every 7 (seven) days.   Yes Historical Provider, MD    Inpatient Medications:  . aspirin EC  325 mg Oral Daily  . beta carotene w/minerals  2 tablet Oral Daily  . [START ON 06/29/2012] cefTRIAXone (ROCEPHIN)  IV  1 g Intravenous Q24H  . enoxaparin (LOVENOX) injection  40 mg Subcutaneous Q24H  . escitalopram  20 mg Oral Daily  . fenofibrate  160 mg Oral Daily  . [START ON 06/29/2012] furosemide  20 mg Oral QODAY  . [START ON 06/29/2012] levothyroxine  75 mcg Oral QAC breakfast  . losartan  50 mg Oral Daily  . metoprolol succinate  100 mg Oral Daily  . multivitamin with minerals  1 tablet Oral Daily  . niacin  1,000 mg Oral QHS  . potassium chloride  10 mEq Oral Daily  . sodium chloride  3 mL Intravenous Q12H  . [START ON 06/30/2012] Vitamin D (Ergocalciferol)  50,000 Units Oral Q7 days   Prescriptions prior to admission  Medication Sig Dispense Refill  . acetaminophen (TYLENOL) 650 MG CR tablet Take 650 mg by mouth 2 (two) times daily.      Marland Kitchen aspirin EC 81 MG tablet Take 81 mg by mouth daily.      . beta carotene w/minerals (OCUVITE) tablet Take 2 tablets by mouth daily.      . Calcium Carbonate-Vitamin D (CALCIUM 600 + D PO) Take 1 tablet by mouth 2 (two) times daily.      . cetirizine (ZYRTEC) 10 MG tablet Take 10 mg by mouth daily as needed for allergies.      Marland Kitchen escitalopram (LEXAPRO) 20 MG tablet Take 20 mg by mouth daily.      . fenofibrate (TRICOR) 145 MG tablet Take 145 mg by mouth daily.      . furosemide (LASIX) 40 MG tablet Take 20 mg by mouth every other day.      .  levothyroxine (SYNTHROID, LEVOTHROID) 75 MCG tablet Take 75 mcg by mouth daily before breakfast.      . losartan (COZAAR) 50 MG tablet Take 50 mg by mouth daily.      . metoprolol succinate (TOPROL-XL) 100 MG 24 hr tablet Take 100 mg by mouth daily. Take with or immediately following a meal.      . Multiple Vitamin (MULTIVITAMIN WITH MINERALS) TABS Take 1 tablet by mouth daily.      . niacin 500 MG CR capsule Take 1,000 mg by mouth at bedtime.      . Omega-3 Fatty  Acids (FISH OIL PO) Take 1 capsule by mouth daily.      . potassium chloride (K-DUR) 10 MEQ tablet Take 10 mEq by mouth daily.      . Vitamin D, Ergocalciferol, (DRISDOL) 50000 UNITS CAPS Take 50,000 Units by mouth every 7 (seven) days.        History reviewed. No pertinent family history.   History   Social History  . Marital Status: Widowed    Spouse Name: N/A    Number of Children: N/A  . Years of Education: N/A   Occupational History  . Not on file.   Social History Main Topics  . Smoking status: Never Smoker   . Smokeless tobacco: Not on file  . Alcohol Use: No  . Drug Use: Not on file  . Sexually Active: No   Other Topics Concern  . Not on file   Social History Narrative  . No narrative on file     Review of Systems: All other systems reviewed and are otherwise negative except as noted above.  Physical Exam: Blood pressure 154/84, pulse 98, temperature 98.2 F (36.8 C), temperature source Oral, resp. rate 22, height 5\' 6"  (1.676 m), weight 82.4 kg (181 lb 10.5 oz), SpO2 95.00%.   GEN:  Somnolent, disoriented to person, place and time.  In NAD. HEENT:  [place]/AT/PERRLA/EOMI, sclera anicteric, no conjunctival pallor/injection NECK:  Supple, no JVD LUNGS:  CTA B/L no w/r/r, pacemaker noted in subcutaneous pocket on left upper chest wall. COR:  +S1/+S2, II/VI crescendo-decrescendo murmur in systole radiating to bilateral carotids, otherwise no R/G ABD: Bowel sounds present, Non tender and not distended with no  gaurding, rigidity or rebound.  EXT/SKIN: No c/c/e, extremities are warm and well perfused, pulses 2+/2+ bilaterally.  No overt rashes.  No decubitus ulcers. NEURO:  Somnolent, disoriented to person, place and time, CN 2-12 grossly intact, strength 4/5 B/L (globally reduced), no focal neurologic deficits, gait could not be evaluated.  Labs: Recent Labs     06/28/12  1252  WBC  10.0  HGB  13.7  HCT  39.4  MCV  89.5  PLT  150   No results found for this basename: VITAMINB12, FOLATE, FERRITIN, TIBC, IRON, RETICCTPCT,  in the last 72 hours No results found for this basename: DDIMER,  in the last 72 hours  Recent Labs Lab 06/28/12 1252  NA 140  K 3.8  CL 103  CO2 26  BUN 11  CREATININE 0.59  CALCIUM 9.3  GLUCOSE 149*   No results found for this basename: HGBA1C,  in the last 72 hours Recent Labs     06/28/12  1532  06/28/12  1640  TROPONINI  1.91*  0.75*   No components found with this basename: POCBNP,  No results found for this basename: CHOL, HDL, LDLCALC, TRIG, CHOLHDL, LDLDIRECT,  in the last 72 hours No results found for this basename: TSH, T4TOTAL, FREET3, T3FREE, THYROIDAB,  in the last 72 hours  Radiology/Studies: Dg Chest 2 View  06/28/2012  RADIOLOGY REPORT  Clinical Data: Fall with altered mental status.  CHEST - 2 VIEW  Comparison: 07/05/2009 chest radiograph  Findings: Cardiomegaly and left-sided pacemaker again noted. Left basilar scarring is again identified. There is no evidence of focal airspace disease, pulmonary edema, suspicious pulmonary nodule/mass, pleural effusion, or pneumothorax. No acute bony abnormalities are identified. A thoracic kyphosis is again visualized.  IMPRESSION: Cardiomegaly without evidence of acute cardiopulmonary disease.   Original Report Authenticated By: Harmon Pier, M.D.  Ct Head Wo Contrast  06/28/2012  RADIOLOGY REPORT  Clinical Data:  Fall.  Altered mental status  CT HEAD WITHOUT CONTRAST CT CERVICAL SPINE WITHOUT CONTRAST   Technique:  Multidetector CT imaging of the head and cervical spine was performed following the standard protocol without intravenous contrast.  Multiplanar CT image reconstructions of the cervical spine were also generated.  Comparison:  CT 03/21/2005  CT HEAD  Findings:  Age appropriate mild atrophy.  Normal brain volume for age.  Minimal chronic microvascular ischemia in the white matter. No acute infarct.  Negative for hemorrhage mass or skull fracture.  IMPRESSION: No acute abnormality.  CT CERVICAL SPINE  Findings: Thoracic scoliosis.  Normal sagittal alignment.  Mild disc degeneration.  There is facet degeneration, left greater than right at C4-5 and C5-6.  Negative for fracture.  IMPRESSION: Mild degenerative change.  Negative for fracture.   Original Report Authenticated By: Janeece Riggers, M.D.    Ct Cervical Spine Wo Contrast  06/28/2012  RADIOLOGY REPORT  Clinical Data:  Fall.  Altered mental status  CT HEAD WITHOUT CONTRAST CT CERVICAL SPINE WITHOUT CONTRAST  Technique:  Multidetector CT imaging of the head and cervical spine was performed following the standard protocol without intravenous contrast.  Multiplanar CT image reconstructions of the cervical spine were also generated.  Comparison:  CT 03/21/2005  CT HEAD  Findings:  Age appropriate mild atrophy.  Normal brain volume for age.  Minimal chronic microvascular ischemia in the white matter. No acute infarct.  Negative for hemorrhage mass or skull fracture.  IMPRESSION: No acute abnormality.  CT CERVICAL SPINE  Findings: Thoracic scoliosis.  Normal sagittal alignment.  Mild disc degeneration.  There is facet degeneration, left greater than right at C4-5 and C5-6.  Negative for fracture.  IMPRESSION: Mild degenerative change.  Negative for fracture.   Original Report Authenticated By: Janeece Riggers, M.D.     06/28/12 12-lead ECG:  Paced rhythm at 95 bpm with dual chamber pacemaker controlling the rhythm hence LBBB-type pattern to QRS.  Cannot  interpret ECG for Q-waves or ST-T changes.  ASSESSMENT:  79 yo WF with PMHx of complete AVHB s/p dual chamber PPM (s/p generator change out by Dr. Amil Amen in 2011), HTN, HLD, hypothyroidism, h/o breast CA s/p mastectomy who presents with acute change in mental status after being found on the floor at her living facility and subsequently found to have possible UTI/acute cystitis and cardiac biomarker (Troponin I) elevation in the absence of angina/anginal equivalence.  ECG is not interpretable for ST-T changes given paced rhythm.  She is quite somnolent but as best as can be ascertained she is asymptomatic from the cardiac standpoint and hemodynamically stable.  Serum troponin I peaked at 1.91 and has since trended down to 0.75.   PLAN:  1-Troponin Elevation in the setting of questionable syncope/fall, possible UTI and altered mental status with no anginal symptoms:  Likely to be a secondary troponin (i.e, demand ischemia) elevation rather than true ACS (patient does not meet 'fourth universal definition for MI' and hence true ACS is unlikely).  Suggest continuing home regimen of Toprol-XL, cozaar and daily aspirin 325mg  po.  As cardiac enzymes have already trended down, there is no clear need to check further troponin's unless there is a change in patient's symptoms or clinical condition.  Agree with obtaining a 2D surface Echo in AM to assess LV function, wall motion and valves (rule-out AS) as the finding of a new wall motion abnormality may indicate ACS/NSTEMI.  If  there are no wall motion abnormalities and normal LV function then it increases the likelihood that this troponin elevation is secondary rather than true ACS.  2-HTN:  Continue home regimen, goal MAP's 65-80 mmHg.  3-HLD:  Patient is on niacin+fenofibrate at home.  Unclear as to whether there would be benefit of statins in this age population given that she is advanced in age and adding a statin in a non-ACS setting may lead to side  effects/tolerance issues (particularly with her being on chronic fibrate therapy at home).  Continue with home niacin for now given that no chest pain, low likelihood of ACS and enzymes have already trended down.  4-Patient is on QOD lasix 20mg  at home for unclear reasons.  Unknown if she has diastolic dysfunction/HFpEF.  May continue home regimen of lasix as there is no evidence of decompensated diastolic HF at the present time.  The pending surface Echo may help to clarify the possibility of HFpEF which is common in advanced age female patients.  We greatly appreciate the management of the patient's medical issues by Dr. Jerral Ralph and the Hospitalist service.  We defer to the hospitalist service if further testing is needed to work-up the ongoing mental status changes.  Thank you for allowing [place] to participate in the care of this pleasant patient.  Please do not hesitate to call with questions/concerns.  [facility] team will follow-up on AM rounds or sooner if indicated.  Christie Nottingham, M.D. Cardiology Fellow-[facility]  06/28/2012, 7:51 PM

## 2012-06-28 NOTE — ED Notes (Signed)
Per EMS, pt from [facility] and when they checked on her this am she was in the floor without clothes beside her bed. Pt doesn't remember how she got there. sts A&O upon their arrival. Pt complaining of HA and light sensitivity. Paced rhythm  On the monitor. IV left FA. Denies any neck pain but slight back pain with movement.

## 2012-06-28 NOTE — ED Notes (Signed)
Patient transported to CT 

## 2012-06-28 NOTE — ED Notes (Signed)
Patient transported to X-ray 

## 2012-06-28 NOTE — H&P (Signed)
PATIENT DETAILS Name: Rachel Cooke Age: 79 y.o. Sex: female Date of Birth: [birth year] Admit Date: 06/28/2012 PCP:No primary provider on file.   CHIEF COMPLAINT:  Altered mental status  HPI: Rachel Cooke is a 79 y.o. female with a Past Medical History of hypertension, dyslipidemia, status post permanent pacemaker placementwho presents today with the above noted complaint.Please note that most of this history is obtained from the patient's daughter who was at bedside, patient currently although much better than on presentation is still unable to provide a reliable history. Per the history obtained, this morning patient was found on the floor and confused at her independent living facility. She was then brought here to the emergency room, where a CT of the head and neck were done which were negative for any significant abnormalities. A urinalysis was suggestive of a UTI. She was given Rocephin, and family has noted some improvement in her mentation-while she has been here. She also had a elevation in a point-of-care troponin levels, however upon repeatedly asking patient denies any significant chest pain or shortness of breath. She is pleasantly confused and does not seem to be in any distress. Daughter at bedside does indicate that the family does not desire any heroic measures, they do however desire IV antibiotics and treatment of potentially reversible conditions.At this point, the family clearly states that did not wish to pursue any aggressive intervention like cardiac cath or stress testing. Per family, patient was in her usual state of health until yesterday, this change in her mental status is acute.There is no documentation of whether she had a fever prior to her ED visit.   ALLERGIES:  No Known Allergies  PAST MEDICAL HISTORY: Past Medical History  Diagnosis Date  . Cancer   . Thyroid disease   . Hypertension   . High cholesterol     PAST SURGICAL HISTORY: Past Surgical  History  Procedure Laterality Date  . Mastectomy      MEDICATIONS AT HOME: Prior to Admission medications   Medication Sig Start Date End Date Taking? Authorizing Provider  acetaminophen (TYLENOL) 650 MG CR tablet Take 650 mg by mouth 2 (two) times daily.   Yes Historical Provider, MD  aspirin EC 81 MG tablet Take 81 mg by mouth daily.   Yes Historical Provider, MD  beta carotene w/minerals (OCUVITE) tablet Take 2 tablets by mouth daily.   Yes Historical Provider, MD  Calcium Carbonate-Vitamin D (CALCIUM 600 + D PO) Take 1 tablet by mouth 2 (two) times daily.   Yes Historical Provider, MD  cetirizine (ZYRTEC) 10 MG tablet Take 10 mg by mouth daily as needed for allergies.   Yes Historical Provider, MD  escitalopram (LEXAPRO) 20 MG tablet Take 20 mg by mouth daily.   Yes Historical Provider, MD  fenofibrate (TRICOR) 145 MG tablet Take 145 mg by mouth daily.   Yes Historical Provider, MD  furosemide (LASIX) 40 MG tablet Take 20 mg by mouth every other day.   Yes Historical Provider, MD  levothyroxine (SYNTHROID, LEVOTHROID) 75 MCG tablet Take 75 mcg by mouth daily before breakfast.   Yes Historical Provider, MD  losartan (COZAAR) 50 MG tablet Take 50 mg by mouth daily.   Yes Historical Provider, MD  metoprolol succinate (TOPROL-XL) 100 MG 24 hr tablet Take 100 mg by mouth daily. Take with or immediately following a meal.   Yes Historical Provider, MD  Multiple Vitamin (MULTIVITAMIN WITH MINERALS) TABS Take 1 tablet by mouth daily.   Yes Historical Provider,  MD  niacin 500 MG CR capsule Take 1,000 mg by mouth at bedtime.   Yes Historical Provider, MD  Omega-3 Fatty Acids (FISH OIL PO) Take 1 capsule by mouth daily.   Yes Historical Provider, MD  potassium chloride (K-DUR) 10 MEQ tablet Take 10 mEq by mouth daily.   Yes Historical Provider, MD  Vitamin D, Ergocalciferol, (DRISDOL) 50000 UNITS CAPS Take 50,000 Units by mouth every 7 (seven) days.   Yes Historical Provider, MD    FAMILY  HISTORY: Father-colon cancer  SOCIAL HISTORY:  reports that she has never smoked. She does not have any smokeless tobacco history on file. She reports that she does not drink alcohol. Her drug history is not on file.  REVIEW OF SYSTEMS:  Constitutional:   No  weight loss, night sweats,  Fevers, chills, fatigue.  HEENT:    No headaches, Difficulty swallowing,Tooth/dental problems,Sore throat,  No sneezing, itching, ear ache, nasal congestion, post nasal drip,   Cardio-vascular: No chest pain,  Orthopnea, PND, swelling in lower extremities, anasarca,dizziness, palpitations  GI:  No heartburn, indigestion, abdominal pain, nausea, vomiting, diarrhea, change in       bowel habits, loss of appetite  Resp: No shortness of breath with exertion or at rest.  No excess mucus, no productive cough, No non-productive cough,  No coughing up of blood.No change in color of mucus.No wheezing.No chest wall deformity  Skin:  no rash or lesions.  GU:  no dysuria, change in color of urine, no urgency or frequency.  No flank pain.  Musculoskeletal: No joint pain or swelling.  No decreased range of motion.  No back pain.  Psych: No change in mood or affect. No depression or anxiety.  No memory loss.   PHYSICAL EXAM: Blood pressure 127/86, pulse 97, temperature 98.3 F (36.8 C), resp. rate 24, SpO2 94.00%.  General appearance :Awake, somewhat confused but not in any distress. Speech slow but Clear. Not toxic Looking HEENT: Atraumatic and Normocephalic, pupils equally reactive to light and accomodation Neck: supple, no JVD. No cervical lymphadenopathy.  Chest:Good air entry bilaterally, no added sounds  CVS: S1 S2 regular, no murmurs.  Abdomen: Bowel sounds present, Non tender and not distended with no gaurding, rigidity or rebound. Extremities: B/L Lower Ext shows no edema, both legs are warm to touch Neurology: Awake alert, and oriented X 3, CN II-XII intact, Non focal Skin:No  Rash Wounds:N/A  LABS ON ADMISSION:   Recent Labs  06/28/12 1252  NA 140  K 3.8  CL 103  CO2 26  GLUCOSE 149*  BUN 11  CREATININE 0.59  CALCIUM 9.3   No results found for this basename: AST, ALT, ALKPHOS, BILITOT, PROT, ALBUMIN,  in the last 72 hours No results found for this basename: LIPASE, AMYLASE,  in the last 72 hours  Recent Labs  06/28/12 1252  WBC 10.0  NEUTROABS 8.0*  HGB 13.7  HCT 39.4  MCV 89.5  PLT 150   No results found for this basename: CKTOTAL, CKMB, CKMBINDEX, TROPONINI,  in the last 72 hours No results found for this basename: DDIMER,  in the last 72 hours No components found with this basename: POCBNP,    RADIOLOGIC STUDIES ON ADMISSION: Dg Chest 2 View  06/28/2012  *RADIOLOGY REPORT*  Clinical Data: Fall with altered mental status.  CHEST - 2 VIEW  Comparison: 07/05/2009 chest radiograph  Findings: Cardiomegaly and left-sided pacemaker again noted. Left basilar scarring is again identified. There is no evidence of focal airspace disease, pulmonary  edema, suspicious pulmonary nodule/mass, pleural effusion, or pneumothorax. No acute bony abnormalities are identified. A thoracic kyphosis is again visualized.  IMPRESSION: Cardiomegaly without evidence of acute cardiopulmonary disease.   Original Report Authenticated By: Harmon Pier, M.D.    Ct Head Wo Contrast  06/28/2012  *RADIOLOGY REPORT*  Clinical Data:  Fall.  Altered mental status  CT HEAD WITHOUT CONTRAST CT CERVICAL SPINE WITHOUT CONTRAST  Technique:  Multidetector CT imaging of the head and cervical spine was performed following the standard protocol without intravenous contrast.  Multiplanar CT image reconstructions of the cervical spine were also generated.  Comparison:  CT 03/21/2005  CT HEAD  Findings:  Age appropriate mild atrophy.  Normal brain volume for age.  Minimal chronic microvascular ischemia in the white matter. No acute infarct.  Negative for hemorrhage mass or skull fracture.   IMPRESSION: No acute abnormality.  CT CERVICAL SPINE  Findings: Thoracic scoliosis.  Normal sagittal alignment.  Mild disc degeneration.  There is facet degeneration, left greater than right at C4-5 and C5-6.  Negative for fracture.  IMPRESSION: Mild degenerative change.  Negative for fracture.   Original Report Authenticated By: Janeece Riggers, M.D.    Ct Cervical Spine Wo Contrast  06/28/2012  *RADIOLOGY REPORT*  Clinical Data:  Fall.  Altered mental status  CT HEAD WITHOUT CONTRAST CT CERVICAL SPINE WITHOUT CONTRAST  Technique:  Multidetector CT imaging of the head and cervical spine was performed following the standard protocol without intravenous contrast.  Multiplanar CT image reconstructions of the cervical spine were also generated.  Comparison:  CT 03/21/2005  CT HEAD  Findings:  Age appropriate mild atrophy.  Normal brain volume for age.  Minimal chronic microvascular ischemia in the white matter. No acute infarct.  Negative for hemorrhage mass or skull fracture.  IMPRESSION: No acute abnormality.  CT CERVICAL SPINE  Findings: Thoracic scoliosis.  Normal sagittal alignment.  Mild disc degeneration.  There is facet degeneration, left greater than right at C4-5 and C5-6.  Negative for fracture.  IMPRESSION: Mild degenerative change.  Negative for fracture.   Original Report Authenticated By: Janeece Riggers, M.D.     ASSESSMENT AND PLAN: Present on Admission:  . Altered mental status -likely secondary to toxic tablet encephalopathy from underlying UTI - CT head negative for significant intracranial abnormalities - Will admit to telemetry, placed on IV Rocephin and obtain urine cultures. - Hopefully in the next day or so, she will be back to her usual self, if not resume then pursue further investigations in consultation with her family.  Marland Kitchen UTI (lower urinary tract infection) -Start IV Rocephin, await urine culture sensitivity. Has been afebrile and does not have any leukocytosis care in the emergency  room.  . NSTEMI (non-ST elevated myocardial infarction) -No history of chest pain or shortness of breath-however patient at this time on reliable historian. - EKG shows left bundle/paced rhythm -Family does not desire any aggressive treatment like cardiac catheterization, hence would treat medically. Continue with aspirin but change to 325 mg, continue with feeding of fibroids and beta blockers. - Will monitor in telemetry. - Follow cardiac enzymes, if significant jump in her cardiac enzymes or if she develops symptoms then we'll touch base with her primary cardiology group.  Marland Kitchen HTN (hypertension) -continue with her prior outpatient medication-losartan and metoprolol  . Dyslipidemia -Continue with Fenofibrate and niacin.  Marland Kitchen Hypothyroidism -Continue with levothyroxine  . remote history of left Breast cancer status post radical mastectomy -no workup required at present  Further plan will  depend as patient's clinical course evolves and further radiologic and laboratory data become available. Patient will be monitored closely.  DVT Prophylaxis: Prophylactic Lovenox   Code Status: DNR-please note-golden yellow universal DO NOT RESUSCITATE present with the patient's daughter at bedside.  Total time spent for admission equals 45 minutes.  [facility] Pager [phone]  If 7PM-7AM, please contact night-coverage [email or web address] Password [facility] 06/28/2012, 3:33 PM

## 2012-06-28 NOTE — Progress Notes (Signed)
Reviewed EKG with Dr Beola Cord complex rhythm with pacer spikes.

## 2012-06-28 NOTE — ED Notes (Signed)
Hospitalist at bedside 

## 2012-06-29 ENCOUNTER — Inpatient Hospital Stay (HOSPITAL_COMMUNITY): Payer: Medicare Other

## 2012-06-29 DIAGNOSIS — I214 Non-ST elevation (NSTEMI) myocardial infarction: Secondary | ICD-10-CM

## 2012-06-29 DIAGNOSIS — E039 Hypothyroidism, unspecified: Secondary | ICD-10-CM

## 2012-06-29 LAB — BASIC METABOLIC PANEL
CO2: 26 mEq/L (ref 19–32)
Calcium: 9 mg/dL (ref 8.4–10.5)
Creatinine, Ser: 0.55 mg/dL (ref 0.50–1.10)

## 2012-06-29 LAB — CBC
MCH: 30.9 pg (ref 26.0–34.0)
MCV: 90.1 fL (ref 78.0–100.0)
Platelets: 153 10*3/uL (ref 150–400)
RBC: 4.04 MIL/uL (ref 3.87–5.11)
RDW: 13.1 % (ref 11.5–15.5)

## 2012-06-29 LAB — PROTIME-INR: Prothrombin Time: 14.3 seconds (ref 11.6–15.2)

## 2012-06-29 MED ORDER — SODIUM CHLORIDE 0.9 % IV SOLN
INTRAVENOUS | Status: AC
  Administered 2012-06-29: 12:00:00 via INTRAVENOUS

## 2012-06-29 MED ORDER — LEVOTHYROXINE SODIUM 100 MCG IV SOLR
25.0000 ug | Freq: Every day | INTRAVENOUS | Status: DC
  Administered 2012-06-29 – 2012-06-30 (×2): 25 ug via INTRAVENOUS
  Filled 2012-06-29 (×3): qty 5

## 2012-06-29 MED ORDER — METOPROLOL TARTRATE 1 MG/ML IV SOLN
5.0000 mg | Freq: Four times a day (QID) | INTRAVENOUS | Status: DC
  Administered 2012-06-29 – 2012-06-30 (×5): 5 mg via INTRAVENOUS
  Filled 2012-06-29 (×8): qty 5

## 2012-06-29 MED ORDER — MIRTAZAPINE 15 MG PO TABS
15.0000 mg | ORAL_TABLET | Freq: Every day | ORAL | Status: DC
  Administered 2012-06-29: 15 mg via ORAL
  Filled 2012-06-29 (×2): qty 1

## 2012-06-29 MED ORDER — PIPERACILLIN-TAZOBACTAM 3.375 G IVPB
3.3750 g | Freq: Three times a day (TID) | INTRAVENOUS | Status: DC
  Administered 2012-06-29 – 2012-06-30 (×4): 3.375 g via INTRAVENOUS
  Filled 2012-06-29 (×6): qty 50

## 2012-06-29 MED ORDER — ASPIRIN 300 MG RE SUPP
300.0000 mg | Freq: Every day | RECTAL | Status: DC
  Administered 2012-06-29 – 2012-06-30 (×2): 300 mg via RECTAL
  Filled 2012-06-29 (×3): qty 1

## 2012-06-29 MED ORDER — BISACODYL 10 MG RE SUPP
10.0000 mg | Freq: Every day | RECTAL | Status: DC | PRN
  Administered 2012-07-01: 10 mg via RECTAL
  Filled 2012-06-29 (×2): qty 1

## 2012-06-29 NOTE — Progress Notes (Signed)
ANTIBIOTIC CONSULT NOTE - INITIAL  Pharmacy Consult for Zosyn Indication: UTI  No Known Allergies  Patient Measurements: Height: 5\' 6"  (167.6 cm) Weight: 183 lb 13.8 oz (83.4 kg) (bed scale) IBW/kg (Calculated) : 59.3   Vital Signs: Temp: 98.1 F (36.7 C) (04/27 0558) Temp src: Oral (04/27 0558) BP: 140/66 mmHg (04/27 0558) Pulse Rate: 84 (04/27 0558) Intake/Output from previous day: 04/26 0701 - 04/27 0700 In: 650 [P.O.:600; IV Piggyback:50] Out: -   Labs:  Recent Labs  06/28/12 1252 06/29/12 0624  WBC 10.0 7.9  HGB 13.7 12.5  PLT 150 153  CREATININE 0.59 0.55   Estimated Creatinine Clearance: 50.8 ml/min (by C-G formula based on Cr of 0.55).  Microbiology: Recent Results (from the past 720 hour(s))  URINE CULTURE     Status: None   Collection Time    06/28/12  1:20 PM      Result Value Range Status   Specimen Description URINE, CATHETERIZED   Final   Special Requests NONE   Final   Culture  Setup Time 06/28/2012 18:16   Final   Colony Count >=100,000 COLONIES/ML   Final   Culture GRAM NEGATIVE RODS   Final   Report Status PENDING   Incomplete    Medical History: Past Medical History  Diagnosis Date  . Cancer   . Thyroid disease   . Hypertension   . High cholesterol    Assessment: 89 YOF admitted with after being found confused on the floor at her independent living facility. Was started on Rocephin for UTI after UA was suggestive of infection. She is not responding to therapy and is to switch to Zosyn. Her CrCl ~89mL/min.   Goal of Therapy:  Eradication of infection  Plan:  1. Start Zosyn 3.375gm IV q8hr extended interval dosing 2. Follow up sensitivities, clinical progression and LOT  Karan Inclan D. Manuella Blackson, PharmD Clinical Pharmacist Pager: [phone] 06/29/2012 10:53 AM

## 2012-06-29 NOTE — Significant Event (Signed)
Rapid Response Event Note  Overview: called for patient not using right side Time Called: 1407 Arrival Time: 1411    Initial Focused Assessment:  Upon arrival to patients room RN and family at bedside.  As per RN, patient has been lethargic all day, however right now is the most alert she has been.   VSS however they got her up to bedside commode and noticed she was flaccid on the right side.  RN states she was moving both sides yesterday.  LSN unknown.  Dr. Lavera Guise notified orders for CTscan.  RN to call if assistance needed   Interventions:   Event Summary:   at      at          [facility], Maryagnes Amos

## 2012-06-29 NOTE — Progress Notes (Signed)
Pt. transferred back to bed with assist from Tai, pt. continues to have right sided weakness, but was able to assist staff with transfer back to bed by using her left side, pt continues to respond appropriately with spontaneous eye opening, repositioned on right side, will continue to monitor.

## 2012-06-29 NOTE — Progress Notes (Signed)
Pt had Trop 1.22, no S/S, 1st was 1.91, 2nd 0.75, then 1.22, notified MD, stated will check 2Decho in AM; will continue to monitor, Thanks, Lavonda Jumbo RN

## 2012-06-29 NOTE — Progress Notes (Signed)
[facility] PROGRESS NOTE  Rachel Cooke ZOX:[id number] DOB: April 16, 1919 DOA: 06/28/2012 PCP: Kimber Relic, MD  Assessment/Plan: 1. Toxic Metabolic Encephalopathy - plan to continue close monitoring for resolution of symptoms . Doubt stroke doubt seizures.  2. GNR UTI - does not seem to respond to rocephin - changed to zosyn  3. NSTEMI - secondary to 1+2 - BB, aspirin and statin . 4. S/p pacer  5. Depression - severe - started remeron at bedtime 6. Hypothyroidism - plan for Synthroid iv unitl taking po reliably   Code Status: full Family Communication: daughter  Disposition Plan: snf   Consultants:  [facility]   Procedures:    Antibiotics: Rocephin 4/26-4/27  HPI/Subjective: Unresponsive   Objective: Filed Vitals:   06/28/12 1609 06/28/12 2030 06/29/12 0059 06/29/12 0558  BP: 154/84 138/67 125/56 140/66  Pulse: 98 86 82 84  Temp: 98.2 F (36.8 C) 97.5 F (36.4 C) 98 F (36.7 C) 98.1 F (36.7 C)  TempSrc: Oral Oral Oral Oral  Resp: 22 18 20 20   Height: 5\' 6"  (1.676 m)     Weight: 82.4 kg (181 lb 10.5 oz)   83.4 kg (183 lb 13.8 oz)  SpO2: 95% 90% 92% 92%   Patient Vitals for the past 24 hrs:  BP Temp Temp src Pulse Resp SpO2 Height Weight  06/29/12 0558 140/66 mmHg 98.1 F (36.7 C) Oral 84 20 92 % - 83.4 kg (183 lb 13.8 oz)  06/29/12 0059 125/56 mmHg 98 F (36.7 C) Oral 82 20 92 % - -  06/28/12 2030 138/67 mmHg 97.5 F (36.4 C) Oral 86 18 90 % - -  06/28/12 1609 154/84 mmHg 98.2 F (36.8 C) Oral 98 22 95 % 5\' 6"  (1.676 m) 82.4 kg (181 lb 10.5 oz)  06/28/12 1445 127/86 mmHg - - 97 24 94 % - -  06/28/12 1315 153/67 mmHg - - 98 25 94 % - -  06/28/12 1300 150/67 mmHg - - 95 23 94 % - -  06/28/12 1215 126/96 mmHg - - 95 22 92 % - -  06/28/12 1207 150/73 mmHg 98.3 F (36.8 C) - 96 18 93 % - -     Intake/Output Summary (Last 24 hours) at 06/29/12 1004 Last data filed at 06/28/12 2030  Gross per 24 hour  Intake    650 ml  Output      0  ml  Net    650 ml   Filed Weights   06/28/12 1609 06/29/12 0558  Weight: 82.4 kg (181 lb 10.5 oz) 83.4 kg (183 lb 13.8 oz)    Exam:   General:  Asleep, eyes in the midline - pupils at 3 mm reactive   Cardiovascular: RRR, no M  Respiratory: clear ant   Abdomen: soft, nt  Musculoskeletal: intact    Data Reviewed: Basic Metabolic Panel:  Recent Labs Lab 06/28/12 1252 06/29/12 0624  NA 140 141  K 3.8 3.9  CL 103 105  CO2 26 26  GLUCOSE 149* 134*  BUN 11 11  CREATININE 0.59 0.55  CALCIUM 9.3 9.0   Liver Function Tests: No results found for this basename: AST, ALT, ALKPHOS, BILITOT, PROT, ALBUMIN,  in the last 168 hours No results found for this basename: LIPASE, AMYLASE,  in the last 168 hours No results found for this basename: AMMONIA,  in the last 168 hours CBC:  Recent Labs Lab 06/28/12 1252 06/29/12 0624  WBC 10.0 7.9  NEUTROABS 8.0*  --  HGB 13.7 12.5  HCT 39.4 36.4  MCV 89.5 90.1  PLT 150 153   Cardiac Enzymes:  Recent Labs Lab 06/28/12 1532 06/28/12 1640 06/28/12 2200 06/29/12 0624  TROPONINI 1.91* 0.75* 1.22* 0.77*   BNP (last 3 results) No results found for this basename: PROBNP,  in the last 8760 hours CBG:  Recent Labs Lab 06/29/12 0947  GLUCAP 125*    Recent Results (from the past 240 hour(s))  URINE CULTURE     Status: None   Collection Time    06/28/12  1:20 PM      Result Value Range Status   Specimen Description URINE, CATHETERIZED   Final   Special Requests NONE   Final   Culture  Setup Time 06/28/2012 18:16   Final   Colony Count >=100,000 COLONIES/ML   Final   Culture GRAM NEGATIVE RODS   Final   Report Status PENDING   Incomplete     Studies: Dg Chest 2 View  06/28/2012  *RADIOLOGY REPORT*  Clinical Data: Fall with altered mental status.  CHEST - 2 VIEW  Comparison: 07/05/2009 chest radiograph  Findings: Cardiomegaly and left-sided pacemaker again noted. Left basilar scarring is again identified. There is no  evidence of focal airspace disease, pulmonary edema, suspicious pulmonary nodule/mass, pleural effusion, or pneumothorax. No acute bony abnormalities are identified. A thoracic kyphosis is again visualized.  IMPRESSION: Cardiomegaly without evidence of acute cardiopulmonary disease.   Original Report Authenticated By: Harmon Pier, M.D.    Ct Head Wo Contrast  06/28/2012  *RADIOLOGY REPORT*  Clinical Data:  Fall.  Altered mental status  CT HEAD WITHOUT CONTRAST CT CERVICAL SPINE WITHOUT CONTRAST  Technique:  Multidetector CT imaging of the head and cervical spine was performed following the standard protocol without intravenous contrast.  Multiplanar CT image reconstructions of the cervical spine were also generated.  Comparison:  CT 03/21/2005  CT HEAD  Findings:  Age appropriate mild atrophy.  Normal brain volume for age.  Minimal chronic microvascular ischemia in the white matter. No acute infarct.  Negative for hemorrhage mass or skull fracture.  IMPRESSION: No acute abnormality.  CT CERVICAL SPINE  Findings: Thoracic scoliosis.  Normal sagittal alignment.  Mild disc degeneration.  There is facet degeneration, left greater than right at C4-5 and C5-6.  Negative for fracture.  IMPRESSION: Mild degenerative change.  Negative for fracture.   Original Report Authenticated By: Janeece Riggers, M.D.    Ct Cervical Spine Wo Contrast  06/28/2012  *RADIOLOGY REPORT*  Clinical Data:  Fall.  Altered mental status  CT HEAD WITHOUT CONTRAST CT CERVICAL SPINE WITHOUT CONTRAST  Technique:  Multidetector CT imaging of the head and cervical spine was performed following the standard protocol without intravenous contrast.  Multiplanar CT image reconstructions of the cervical spine were also generated.  Comparison:  CT 03/21/2005  CT HEAD  Findings:  Age appropriate mild atrophy.  Normal brain volume for age.  Minimal chronic microvascular ischemia in the white matter. No acute infarct.  Negative for hemorrhage mass or skull  fracture.  IMPRESSION: No acute abnormality.  CT CERVICAL SPINE  Findings: Thoracic scoliosis.  Normal sagittal alignment.  Mild disc degeneration.  There is facet degeneration, left greater than right at C4-5 and C5-6.  Negative for fracture.  IMPRESSION: Mild degenerative change.  Negative for fracture.   Original Report Authenticated By: Janeece Riggers, M.D.     Scheduled Meds: . aspirin  300 mg Rectal Daily  . enoxaparin (LOVENOX) injection  40 mg Subcutaneous Q24H  .  levothyroxine  25 mcg Intravenous Daily  . metoprolol  5 mg Intravenous Q6H  . mirtazapine  15 mg Oral QHS   Continuous Infusions: . sodium chloride      Principal Problem:   Altered mental status Active Problems:   UTI (lower urinary tract infection)   NSTEMI (non-ST elevated myocardial infarction)   HTN (hypertension)   Dyslipidemia   Hypothyroidism   Breast cancer     Samie Reasons  [facility] Pager [phone]. If 7PM-7AM, please contact night-coverage at [email or web address], password [facility] 06/29/2012, 10:04 AM  LOS: 1 day

## 2012-06-29 NOTE — Progress Notes (Signed)
Patient ID: Rachel Cooke, female   DOB: [birth year], 79 y.o.   MRN: [id number] Subjective:  Minimal speech. Opens eyes to commands  Objective:  Vital Signs in the last 24 hours: Temp:  [97.5 F (36.4 C)-98.3 F (36.8 C)] 98.1 F (36.7 C) (04/27 0558) Pulse Rate:  [82-98] 84 (04/27 0558) Resp:  [18-25] 20 (04/27 0558) BP: (125-154)/(56-96) 140/66 mmHg (04/27 0558) SpO2:  [90 %-95 %] 92 % (04/27 0558) Weight:  [181 lb 10.5 oz (82.4 kg)-183 lb 13.8 oz (83.4 kg)] 183 lb 13.8 oz (83.4 kg) (04/27 0558)  Intake/Output from previous day: 04/26 0701 - 04/27 0700 In: 650 [P.O.:600; IV Piggyback:50] Out: -  Intake/Output from this shift:    Physical Exam: Elderly, somnolent appearing NAD HEENT: Unremarkable Neck:  7 cm JVD, no thyromegally Lungs:  Clear with no wheezes, rales, or rhonchi HEART:  Regular rate rhythm, no murmurs, no rubs, no clicks Abd:  obese, positive bowel sounds, no organomegally, no rebound, no guarding Ext:  2 plus pulses, no edema, no cyanosis, no clubbing Skin:  No rashes no nodules Neuro:  CN II through XII intact, motor grossly intact  Lab Results:  Recent Labs  06/28/12 1252 06/29/12 0624  WBC 10.0 7.9  HGB 13.7 12.5  PLT 150 153    Recent Labs  06/28/12 1252 06/29/12 0624  NA 140 141  K 3.8 3.9  CL 103 105  CO2 26 26  GLUCOSE 149* 134*  BUN 11 11  CREATININE 0.59 0.55    Recent Labs  06/28/12 2200 06/29/12 0624  TROPONINI 1.22* 0.77*   Hepatic Function Panel No results found for this basename: PROT, ALBUMIN, AST, ALT, ALKPHOS, BILITOT, BILIDIR, IBILI,  in the last 72 hours No results found for this basename: CHOL,  in the last 72 hours No results found for this basename: PROTIME,  in the last 72 hours  Imaging: Dg Chest 2 View  06/28/2012  *RADIOLOGY REPORT*  Clinical Data: Fall with altered mental status.  CHEST - 2 VIEW  Comparison: 07/05/2009 chest radiograph  Findings: Cardiomegaly and left-sided pacemaker again noted. Left  basilar scarring is again identified. There is no evidence of focal airspace disease, pulmonary edema, suspicious pulmonary nodule/mass, pleural effusion, or pneumothorax. No acute bony abnormalities are identified. A thoracic kyphosis is again visualized.  IMPRESSION: Cardiomegaly without evidence of acute cardiopulmonary disease.   Original Report Authenticated By: Harmon Pier, M.D.    Ct Head Wo Contrast  06/28/2012  *RADIOLOGY REPORT*  Clinical Data:  Fall.  Altered mental status  CT HEAD WITHOUT CONTRAST CT CERVICAL SPINE WITHOUT CONTRAST  Technique:  Multidetector CT imaging of the head and cervical spine was performed following the standard protocol without intravenous contrast.  Multiplanar CT image reconstructions of the cervical spine were also generated.  Comparison:  CT 03/21/2005  CT HEAD  Findings:  Age appropriate mild atrophy.  Normal brain volume for age.  Minimal chronic microvascular ischemia in the white matter. No acute infarct.  Negative for hemorrhage mass or skull fracture.  IMPRESSION: No acute abnormality.  CT CERVICAL SPINE  Findings: Thoracic scoliosis.  Normal sagittal alignment.  Mild disc degeneration.  There is facet degeneration, left greater than right at C4-5 and C5-6.  Negative for fracture.  IMPRESSION: Mild degenerative change.  Negative for fracture.   Original Report Authenticated By: Janeece Riggers, M.D.    Ct Cervical Spine Wo Contrast  06/28/2012  *RADIOLOGY REPORT*  Clinical Data:  Fall.  Altered mental status  CT HEAD  WITHOUT CONTRAST CT CERVICAL SPINE WITHOUT CONTRAST  Technique:  Multidetector CT imaging of the head and cervical spine was performed following the standard protocol without intravenous contrast.  Multiplanar CT image reconstructions of the cervical spine were also generated.  Comparison:  CT 03/21/2005  CT HEAD  Findings:  Age appropriate mild atrophy.  Normal brain volume for age.  Minimal chronic microvascular ischemia in the white matter. No acute  infarct.  Negative for hemorrhage mass or skull fracture.  IMPRESSION: No acute abnormality.  CT CERVICAL SPINE  Findings: Thoracic scoliosis.  Normal sagittal alignment.  Mild disc degeneration.  There is facet degeneration, left greater than right at C4-5 and C5-6.  Negative for fracture.  IMPRESSION: Mild degenerative change.  Negative for fracture.   Original Report Authenticated By: Janeece Riggers, M.D.     Cardiac Studies: Tele - NSR with ventricular pacing Assessment/Plan:  1. Elevated troponin 2. Metabolic encephalopathy 3. UTI 4. CHB, s/p PPM Rec: Agree with family. This appears to be a Type 2 MI pattern. No evidence that elevated troponin represents anything more than response to metabolic stress. Would treat as you are doing. No additional cardiac workup warranted provided the clinical course remains stable. Will sign off. She will need followup with Dr. Eldridge Dace.  LOS: 1 day    Gregg Taylor,M.D. 06/29/2012, 10:06 AM

## 2012-06-29 NOTE — Progress Notes (Signed)
  Echocardiogram 2D Echocardiogram has been performed.  Rachel Cooke A 06/29/2012, 1:17 PM

## 2012-06-29 NOTE — Progress Notes (Signed)
PT Cancellation Note  Patient Details Name: Rachel Cooke MRN: [id number] DOB: [birth year]   Cancelled Treatment:       Orders received, chart reviewed  Noted Rapid Response called for R sided weakness, and pt is currently at [facility]  Will follow up for PT eval tomorrow  Thanks,  Van Clines, [place] [phone]    Van Clines [facility] 06/29/2012, 3:54 PM

## 2012-06-29 NOTE — Progress Notes (Signed)
Utilization Review Completed.   Kaidyn Hernandes, RN, BSN Nurse Case Manager  [phone]  

## 2012-06-29 NOTE — Progress Notes (Signed)
Pt unable to void on bedpan, with the assist of charge nurse, Tai and I Yosiel Thieme RN, assisted patient to [facility], noted right sided weakness of arm and leg, Dr. Lavera Guise called, neuro check performed, pt. able to move left side, right side continues to be flaccid, Dr. Lavera Guise ordered a CT scan of Head and Rapid Response called, patient transported to radiology by Tai and Peggy.

## 2012-06-29 NOTE — Progress Notes (Signed)
Pt. has not voided today, bladder scan performed times 2, and 173 mls noted during scans, Dr. Lavera Guise aware, no new orders at this time, will continue to monitor. Pt. transferred to recliner with assist of [facility], continues to have right sided weakness, pt appears to be responding more appropriately, talking with son-n-law and eyes open spontaneously. Will continue to monitor.

## 2012-06-29 NOTE — Progress Notes (Signed)
Pt. voided of yellow urine while using the bedpan, no odor present. Will continue to monitor.

## 2012-06-30 ENCOUNTER — Inpatient Hospital Stay (HOSPITAL_COMMUNITY): Payer: Medicare Other

## 2012-06-30 LAB — CBC
MCH: 31.8 pg (ref 26.0–34.0)
MCV: 91.5 fL (ref 78.0–100.0)
Platelets: 140 10*3/uL — ABNORMAL LOW (ref 150–400)
RBC: 4.24 MIL/uL (ref 3.87–5.11)

## 2012-06-30 LAB — BASIC METABOLIC PANEL
BUN: 10 mg/dL (ref 6–23)
CO2: 28 mEq/L (ref 19–32)
Calcium: 9.1 mg/dL (ref 8.4–10.5)
Creatinine, Ser: 0.6 mg/dL (ref 0.50–1.10)
Glucose, Bld: 148 mg/dL — ABNORMAL HIGH (ref 70–99)

## 2012-06-30 LAB — URINE CULTURE

## 2012-06-30 LAB — GLUCOSE, CAPILLARY: Glucose-Capillary: 94 mg/dL (ref 70–99)

## 2012-06-30 MED ORDER — CEFTRIAXONE SODIUM 1 G IJ SOLR
1.0000 g | INTRAMUSCULAR | Status: DC
  Administered 2012-06-30 – 2012-07-02 (×3): 1 g via INTRAVENOUS
  Filled 2012-06-30 (×4): qty 10

## 2012-06-30 MED ORDER — METOPROLOL TARTRATE 1 MG/ML IV SOLN
5.0000 mg | Freq: Three times a day (TID) | INTRAVENOUS | Status: DC
  Administered 2012-06-30 – 2012-07-01 (×2): 5 mg via INTRAVENOUS
  Filled 2012-06-30 (×3): qty 5

## 2012-06-30 MED ORDER — SODIUM CHLORIDE 0.9 % IV SOLN
INTRAVENOUS | Status: DC
  Administered 2012-06-30 – 2012-07-01 (×2): via INTRAVENOUS

## 2012-06-30 NOTE — Progress Notes (Signed)
SUBJECTIVE:  Not complaining of pain.  OBJECTIVE:   Vitals:   Filed Vitals:   06/30/12 0626 06/30/12 1152 06/30/12 1300 06/30/12 1412  BP: 151/77 146/73 138/68 123/73  Pulse: 77 67 66 62  Temp: 98.1 F (36.7 C) 98.2 F (36.8 C)  97.5 F (36.4 C)  TempSrc: Axillary Oral  Oral  Resp: 22 20  20   Height:      Weight: 84.9 kg (187 lb 2.7 oz)     SpO2: 98% 96%  96%   I&O's:   Intake/Output Summary (Last 24 hours) at 06/30/12 1646 Last data filed at 06/30/12 1209  Gross per 24 hour  Intake  332.5 ml  Output    950 ml  Net -617.5 ml   TELEMETRY: Reviewed telemetry pt in paced rhythm:     PHYSICAL EXAM General: Lethargic Head:   Normal cephalic and atramatic  Lungs:  No wheezing Heart:   HRRR S1 S2  Abdomen: abdomen soft and non-tender  Msk:  Back normal, normal gait. Normal strength and tone for age. Extremities:  Trace edema.  DP +1 Neuro: Disoriented Psych:  Disoriented   LABS: Basic Metabolic Panel:  Recent Labs  03/06/70 0624 06/30/12 0920  NA 141 141  K 3.9 3.9  CL 105 104  CO2 26 28  GLUCOSE 134* 148*  BUN 11 10  CREATININE 0.55 0.60  CALCIUM 9.0 9.1   Liver Function Tests: No results found for this basename: AST, ALT, ALKPHOS, BILITOT, PROT, ALBUMIN,  in the last 72 hours No results found for this basename: LIPASE, AMYLASE,  in the last 72 hours CBC:  Recent Labs  06/28/12 1252 06/29/12 0624 06/30/12 0920  WBC 10.0 7.9 7.6  NEUTROABS 8.0*  --   --   HGB 13.7 12.5 13.5  HCT 39.4 36.4 38.8  MCV 89.5 90.1 91.5  PLT 150 153 140*   Cardiac Enzymes:  Recent Labs  06/28/12 1640 06/28/12 2200 06/29/12 0624  TROPONINI 0.75* 1.22* 0.77*   BNP: No components found with this basename: POCBNP,  D-Dimer: No results found for this basename: DDIMER,  in the last 72 hours Hemoglobin A1C: No results found for this basename: HGBA1C,  in the last 72 hours Fasting Lipid Panel: No results found for this basename: CHOL, HDL, LDLCALC, TRIG, CHOLHDL,  LDLDIRECT,  in the last 72 hours Thyroid Function Tests: No results found for this basename: TSH, T4TOTAL, FREET3, T3FREE, THYROIDAB,  in the last 72 hours Anemia Panel: No results found for this basename: VITAMINB12, FOLATE, FERRITIN, TIBC, IRON, RETICCTPCT,  in the last 72 hours Coag Panel:   Lab Results  Component Value Date   INR 1.13 06/29/2012    RADIOLOGY: Dg Chest 2 View  06/28/2012  *RADIOLOGY REPORT*  Clinical Data: Fall with altered mental status.  CHEST - 2 VIEW  Comparison: 07/05/2009 chest radiograph  Findings: Cardiomegaly and left-sided pacemaker again noted. Left basilar scarring is again identified. There is no evidence of focal airspace disease, pulmonary edema, suspicious pulmonary nodule/mass, pleural effusion, or pneumothorax. No acute bony abnormalities are identified. A thoracic kyphosis is again visualized.  IMPRESSION: Cardiomegaly without evidence of acute cardiopulmonary disease.   Original Report Authenticated By: Harmon Pier, M.D.    Ct Head Wo Contrast  06/29/2012  *RADIOLOGY REPORT*  Clinical Data: Fall.  Right-sided weakness.  Unresponsive patient.  CT HEAD WITHOUT CONTRAST  Technique:  Contiguous axial images were obtained from the base of the skull through the vertex without contrast.  Comparison: 06/28/2012  Findings:  The brain stem, cerebellum, cerebral peduncles, thalami, basal ganglia, basilar cisterns, and ventricular system appear unremarkable.  Subtly increased hypodensity in the left parietal corona radiata noted.  No significant loss of gray-white differentiation or other discrete indicators of stroke.  No intracranial hemorrhage or obvious mass lesion.  No midline shift.  Atherosclerotic calcification of the carotid siphons noted.  IMPRESSION:  1.  Subtle hypodensity in the left parietal corona radiata/centrum semiovale appears more conspicuous than on yesterday's exam.  This could be an indicator of a small white matter infarct, inflammatory focus, or  injury.  No hemorrhage identified.  MRI may help for further characterization if clinically warranted.   Original Report Authenticated By: Gaylyn Rong, M.D.    Ct Head Wo Contrast  06/28/2012  *RADIOLOGY REPORT*  Clinical Data:  Fall.  Altered mental status  CT HEAD WITHOUT CONTRAST CT CERVICAL SPINE WITHOUT CONTRAST  Technique:  Multidetector CT imaging of the head and cervical spine was performed following the standard protocol without intravenous contrast.  Multiplanar CT image reconstructions of the cervical spine were also generated.  Comparison:  CT 03/21/2005  CT HEAD  Findings:  Age appropriate mild atrophy.  Normal brain volume for age.  Minimal chronic microvascular ischemia in the white matter. No acute infarct.  Negative for hemorrhage mass or skull fracture.  IMPRESSION: No acute abnormality.  CT CERVICAL SPINE  Findings: Thoracic scoliosis.  Normal sagittal alignment.  Mild disc degeneration.  There is facet degeneration, left greater than right at C4-5 and C5-6.  Negative for fracture.  IMPRESSION: Mild degenerative change.  Negative for fracture.   Original Report Authenticated By: Janeece Riggers, M.D.    Ct Cervical Spine Wo Contrast  06/28/2012  *RADIOLOGY REPORT*  Clinical Data:  Fall.  Altered mental status  CT HEAD WITHOUT CONTRAST CT CERVICAL SPINE WITHOUT CONTRAST  Technique:  Multidetector CT imaging of the head and cervical spine was performed following the standard protocol without intravenous contrast.  Multiplanar CT image reconstructions of the cervical spine were also generated.  Comparison:  CT 03/21/2005  CT HEAD  Findings:  Age appropriate mild atrophy.  Normal brain volume for age.  Minimal chronic microvascular ischemia in the white matter. No acute infarct.  Negative for hemorrhage mass or skull fracture.  IMPRESSION: No acute abnormality.  CT CERVICAL SPINE  Findings: Thoracic scoliosis.  Normal sagittal alignment.  Mild disc degeneration.  There is facet degeneration,  left greater than right at C4-5 and C5-6.  Negative for fracture.  IMPRESSION: Mild degenerative change.  Negative for fracture.   Original Report Authenticated By: Janeece Riggers, M.D.    Dg Chest Port 1 View  06/30/2012  *RADIOLOGY REPORT*  Clinical Data: 79 year old female right side weakness and cough. Possible aspiration.  PORTABLE CHEST - 1 VIEW  Comparison: 06/28/2012 and earlier.  Findings: Portable upright AP view at 1218 hours.  The patient is slightly more rotated to the left.  Stable lung volumes.  Stable cardiac size and mediastinal contours.  Left chest cardiac AICD again noted.  Stable scarring or atelectasis at the left lung base with no acute or confluent pulmonary opacity to suggest aspiration. No pneumothorax, edema, or definite effusion.  IMPRESSION: Stable. No acute cardiopulmonary abnormality.   Original Report Authenticated By: Erskine Speed, M.D.       ASSESSMENT: Abnormal troponin  PLAN:  Normal LV function by echo.  I do not think this was a true plaque rupture MI.  No need for heparin.  Treat UTI.  Hopefully,  mental status will improve as UTI gets better.     Corky Crafts., MD  06/30/2012  4:46 PM

## 2012-06-30 NOTE — Progress Notes (Signed)
[facility] PROGRESS NOTE  Rachel Cooke OZD:[id number] DOB: 1919/10/05 DOA: 06/28/2012 PCP: Kimber Relic, MD  Assessment/Plan: 1. Toxic Metabolic Encephalopathy Persistent also we have noticed that the patient may have right hemiparesis. Most likely the patient has also suffered a cerebrovascular accident prior to admission. She does have a pacemaker so we will not be able to perform an MRI. We'll continue to monitor her clinically on aspirin per rectum. There is no reported seizure activity 2. Ecoli UTI - initially we assumed that she was not responding to rocephin. She was changed to zosyn on 4/27, but The final urine culture was positive for Escherichia coli which is essentially pan sensitive. Patient to resume Rocephin on April 28.  3. NSTEMI - secondary to 1+2 - BB iv aspirin PR and  We will resume statin When patient is able to take oral medications. Transthoracic echocardiogram with preserved ejection fraction and no wall motion abnormalities.  4. S/p pacer  5. Hypothyroidism - plan for Synthroid iv unitl taking po reliably 6. Dysphagia-due to altered mental status and probably stroke  Family will pursue comfort care if patient does not improve, Based on patient's prior expressed wishes to do them   Code Status: DNR Family Communication: daughter  Disposition Plan: snf   Consultants:  [facility]   Procedures:  CT head  Echo  Antibiotics: Rocephin 4/26-  HPI/Subjective: Unresponsive   Objective: Filed Vitals:   06/30/12 0626 06/30/12 1152 06/30/12 1300 06/30/12 1412  BP: 151/77 146/73 138/68 123/73  Pulse: 77 67 66 62  Temp: 98.1 F (36.7 C) 98.2 F (36.8 C)  97.5 F (36.4 C)  TempSrc: Axillary Oral  Oral  Resp: 22 20  20   Height:      Weight: 84.9 kg (187 lb 2.7 oz)     SpO2: 98% 96%  96%   Patient Vitals for the past 24 hrs:  BP Temp Temp src Pulse Resp SpO2 Weight  06/30/12 1412 123/73 mmHg 97.5 F (36.4 C) Oral 62 20 96 % -   06/30/12 1300 138/68 mmHg - - 66 - - -  06/30/12 1152 146/73 mmHg 98.2 F (36.8 C) Oral 67 20 96 % -  06/30/12 0626 151/77 mmHg 98.1 F (36.7 C) Axillary 77 22 98 % 84.9 kg (187 lb 2.7 oz)  06/30/12 0120 132/61 mmHg - - 68 - - -  06/29/12 2056 153/76 mmHg 98.2 F (36.8 C) Oral 74 18 93 % -  06/29/12 1836 131/55 mmHg - - 75 - - -  06/29/12 1815 172/87 mmHg 98.4 F (36.9 C) Oral 83 20 93 % -     Intake/Output Summary (Last 24 hours) at 06/30/12 1724 Last data filed at 06/30/12 1209  Gross per 24 hour  Intake  332.5 ml  Output    950 ml  Net -617.5 ml   Filed Weights   06/28/12 1609 06/29/12 0558 06/30/12 0626  Weight: 82.4 kg (181 lb 10.5 oz) 83.4 kg (183 lb 13.8 oz) 84.9 kg (187 lb 2.7 oz)    Exam:   General:  Asleep, eyes in the midline - pupils at 3 mm reactive   Cardiovascular: RRR, no M  Respiratory: clear ant   Abdomen: soft, nt  Musculoskeletal: intact    Data Reviewed: Basic Metabolic Panel:  Recent Labs Lab 06/28/12 1252 06/29/12 0624 06/30/12 0920  NA 140 141 141  K 3.8 3.9 3.9  CL 103 105 104  CO2 26 26 28   GLUCOSE 149* 134* 148*  BUN  11 11 10   CREATININE 0.59 0.55 0.60  CALCIUM 9.3 9.0 9.1   Liver Function Tests: No results found for this basename: AST, ALT, ALKPHOS, BILITOT, PROT, ALBUMIN,  in the last 168 hours No results found for this basename: LIPASE, AMYLASE,  in the last 168 hours No results found for this basename: AMMONIA,  in the last 168 hours CBC:  Recent Labs Lab 06/28/12 1252 06/29/12 0624 06/30/12 0920  WBC 10.0 7.9 7.6  NEUTROABS 8.0*  --   --   HGB 13.7 12.5 13.5  HCT 39.4 36.4 38.8  MCV 89.5 90.1 91.5  PLT 150 153 140*   Cardiac Enzymes:  Recent Labs Lab 06/28/12 1532 06/28/12 1640 06/28/12 2200 06/29/12 0624  TROPONINI 1.91* 0.75* 1.22* 0.77*   BNP (last 3 results) No results found for this basename: PROBNP,  in the last 8760 hours CBG:  Recent Labs Lab 06/29/12 0947 06/30/12 1357  GLUCAP 125*  94    Recent Results (from the past 240 hour(s))  URINE CULTURE     Status: None   Collection Time    06/28/12  1:20 PM      Result Value Range Status   Specimen Description URINE, CATHETERIZED   Final   Special Requests NONE   Final   Culture  Setup Time 06/28/2012 18:16   Final   Colony Count >=100,000 COLONIES/ML   Final   Culture ESCHERICHIA COLI   Final   Report Status 06/30/2012 FINAL   Final   Organism ID, Bacteria ESCHERICHIA COLI   Final     Studies: Ct Head Wo Contrast  06/29/2012  *RADIOLOGY REPORT*  Clinical Data: Fall.  Right-sided weakness.  Unresponsive patient.  CT HEAD WITHOUT CONTRAST  Technique:  Contiguous axial images were obtained from the base of the skull through the vertex without contrast.  Comparison: 06/28/2012  Findings: The brain stem, cerebellum, cerebral peduncles, thalami, basal ganglia, basilar cisterns, and ventricular system appear unremarkable.  Subtly increased hypodensity in the left parietal corona radiata noted.  No significant loss of gray-white differentiation or other discrete indicators of stroke.  No intracranial hemorrhage or obvious mass lesion.  No midline shift.  Atherosclerotic calcification of the carotid siphons noted.  IMPRESSION:  1.  Subtle hypodensity in the left parietal corona radiata/centrum semiovale appears more conspicuous than on yesterday's exam.  This could be an indicator of a small white matter infarct, inflammatory focus, or injury.  No hemorrhage identified.  MRI may help for further characterization if clinically warranted.   Original Report Authenticated By: Gaylyn Rong, M.D.    Dg Chest Port 1 View  06/30/2012  *RADIOLOGY REPORT*  Clinical Data: 79 year old female right side weakness and cough. Possible aspiration.  PORTABLE CHEST - 1 VIEW  Comparison: 06/28/2012 and earlier.  Findings: Portable upright AP view at 1218 hours.  The patient is slightly more rotated to the left.  Stable lung volumes.  Stable cardiac  size and mediastinal contours.  Left chest cardiac AICD again noted.  Stable scarring or atelectasis at the left lung base with no acute or confluent pulmonary opacity to suggest aspiration. No pneumothorax, edema, or definite effusion.  IMPRESSION: Stable. No acute cardiopulmonary abnormality.   Original Report Authenticated By: Erskine Speed, M.D.     Scheduled Meds: . aspirin  300 mg Rectal Daily  . cefTRIAXone (ROCEPHIN)  IV  1 g Intravenous Q24H  . enoxaparin (LOVENOX) injection  40 mg Subcutaneous Q24H  . levothyroxine  25 mcg Intravenous Daily  .  metoprolol  5 mg Intravenous Q8H   Continuous Infusions:    Principal Problem:   Altered mental status Active Problems:   UTI (lower urinary tract infection)   NSTEMI (non-ST elevated myocardial infarction)   HTN (hypertension)   Dyslipidemia   Hypothyroidism   Breast cancer     Mariadejesus Cade  [facility] Pager [phone]. If 7PM-7AM, please contact night-coverage at [email or web address], password [facility] 06/30/2012, 5:24 PM  LOS: 2 days

## 2012-06-30 NOTE — Progress Notes (Signed)
PT Cancellation Note  Patient Details Name: Rachel Cooke MRN: [id number] DOB: November 02, 1919   Cancelled Treatment:    Reason Eval/Treat Not Completed: Patient not medically ready.  Will follow up tomorrow.   Yenni Carra 06/30/2012, 2:39 PM

## 2012-06-30 NOTE — Evaluation (Addendum)
Clinical/Bedside Swallow Evaluation Patient Details  Name: PHYLISHA DIX MRN: [id number] Date of Birth: 1919/05/09  Today's Date: 06/30/2012 Time: 4098-1191 SLP Time Calculation (min): 24 min  Past Medical History:  Past Medical History  Diagnosis Date  . Cancer   . Thyroid disease   . Hypertension   . High cholesterol    Past Surgical History:  Past Surgical History  Procedure Laterality Date  . Mastectomy     HPI:  79 y.o. female with a PMH of hypertension, dyslipidemia, status post permanent pacemaker placement who presents today with AMS.  Patient was found on the floor and confused at her independent living facility. Initial head CT was negative.  Yesterday pt. experienced right sided weakness and second CT revealed subtle hypodensity in the left parietal corona radiata/centrumad could be an indicator of a small white matter infarct, inflammatory focus or injury.  Found to have UTI, MI.  Daughter denies any prior oropharyngeal dysphagia.     Assessment / Plan / Recommendation Clinical Impression  Pt. lethargic and responded to questions most of the time, kept eyes closed and frequently feel asleep.  Oral care performed prior to giving small bite applesauce.  No attempts made to manipulate bolus with therapeutic intervention utilizing max verbal and tactile cues.  Bolus suctioned from oral cavity after 60 seconds.  Educated family to keep NPO and use of toothette to moisten oral cavity if needed.  SLP will return in the morning.    Aspiration Risk  Severe    Diet Recommendation NPO        Other  Recommendations Oral Care Recommendations: Oral care BID   Follow Up Recommendations   (to be determined)    Frequency and Duration min 2x/week  2 weeks   Pertinent Vitals/Pain none    SLP Swallow Goals Goal #3: Pt. will increase arousal and orally manipulate bolus to initiate pharyngeal swallow to determine safe po intake with mod verbal cues.   Swallow Study          Oral/Motor/Sensory Function Overall Oral Motor/Sensory Function:  (too lethargic)   Ice Chips Ice chips: Not tested   Thin Liquid Thin Liquid: Not tested    Nectar Thick Nectar Thick Liquid: Not tested   Honey Thick Honey Thick Liquid: Not tested   Puree Puree: Impaired Presentation: Spoon Oral Phase Impairments: Reduced lingual movement/coordination;Impaired anterior to posterior transit;Poor awareness of bolus Oral Phase Functional Implications: Oral holding;Oral residue   Solid   GO    Solid: Not tested       Royce Macadamia M.Ed [employer] [phone]  06/30/2012

## 2012-06-30 NOTE — Progress Notes (Signed)
INITIAL NUTRITION ASSESSMENT  DOCUMENTATION CODES Per approved criteria  -Obesity Unspecified   INTERVENTION: 1. If pt remains unable to advance diet or safely meet nutrition needs with oral intake, recommend initiation of enteral nutrition  NUTRITION DIAGNOSIS: Inadequate oral intake related to inability to eat as evidenced by NPO diet status.   Goal: Diet advance vs enteral nutrition to meet >/=90% estimated nutrition needs.   Monitor:  Diet advance, weight trends, labs  Reason for Assessment: Low Braden Score   79 y.o. female  Admitting Dx: Altered mental status  ASSESSMENT: Pt admitted with decreased mental status, UTI, and elevated troponin. Pt noted to have decreased right sided movement on 4/27.  Pt noted to choke with oral intake today, SLP consulted who recommends NPO diet at this time. RD pulled to pt for low Braden score, indicating pt is at increased risk for skin breakdown. No breakdown noted at this time.   Height: Ht Readings from Last 1 Encounters:  06/28/12 5\' 6"  (1.676 m)    Weight: Wt Readings from Last 1 Encounters:  06/30/12 187 lb 2.7 oz (84.9 kg)    Ideal Body Weight: 130 lbs   % Ideal Body Weight: 143%  Wt Readings from Last 10 Encounters:  06/30/12 187 lb 2.7 oz (84.9 kg)  Weight trending up at this admission.   Usual Body Weight: no weight hx on file, weight loss denied on malnutrition screening tool   % Usual Body Weight: --   BMI:  Body mass index is 30.22 kg/(m^2). Obesity class 1   Estimated Nutritional Needs: Kcal: 1600-1800 Protein: 70-80 gm  Fluid: 1.6-1.8 L   Skin: intact   Diet Order: NPO  EDUCATION NEEDS: -No education needs identified at this time   Intake/Output Summary (Last 24 hours) at 06/30/12 1356 Last data filed at 06/30/12 1209  Gross per 24 hour  Intake  537.5 ml  Output    950 ml  Net -412.5 ml    Last BM: intact    Labs:   Recent Labs Lab 06/28/12 1252 06/29/12 0624 06/30/12 0920  NA 140  141 141  K 3.8 3.9 3.9  CL 103 105 104  CO2 26 26 28   BUN 11 11 10   CREATININE 0.59 0.55 0.60  CALCIUM 9.3 9.0 9.1  GLUCOSE 149* 134* 148*    CBG (last 3)   Recent Labs  06/29/12 0947  GLUCAP 125*    Scheduled Meds: . aspirin  300 mg Rectal Daily  . enoxaparin (LOVENOX) injection  40 mg Subcutaneous Q24H  . levothyroxine  25 mcg Intravenous Daily  . metoprolol  5 mg Intravenous Q6H  . piperacillin-tazobactam (ZOSYN)  IV  3.375 g Intravenous Q8H    Continuous Infusions:   Past Medical History  Diagnosis Date  . Cancer   . Thyroid disease   . Hypertension   . High cholesterol     Past Surgical History  Procedure Laterality Date  . Mastectomy      Clarene Duke RD, LDN Pager [phone] After Hours pager [phone]

## 2012-06-30 NOTE — Progress Notes (Signed)
Pt not voided since I & 0 cath this am and obtain 700+ as per report from off going nurse.  Dr Lavera Guise made aware and instructed it was ok and ordered NS at 54ml/hr.  Will continue to monitor.  Amanda Pea, Charity fundraiser.

## 2012-06-30 NOTE — Progress Notes (Signed)
Patient choking when drinking.  Dr. Lavera Guise notified.  Orders made for Speech therapy eval.

## 2012-07-01 DIAGNOSIS — I6350 Cerebral infarction due to unspecified occlusion or stenosis of unspecified cerebral artery: Principal | ICD-10-CM

## 2012-07-01 DIAGNOSIS — I6789 Other cerebrovascular disease: Secondary | ICD-10-CM

## 2012-07-01 DIAGNOSIS — I639 Cerebral infarction, unspecified: Secondary | ICD-10-CM | POA: Diagnosis present

## 2012-07-01 LAB — LIPID PANEL
Cholesterol: 174 mg/dL (ref 0–200)
HDL: 42 mg/dL (ref >39)
Total CHOL/HDL Ratio: 4.1 RATIO
Triglycerides: 167 mg/dL — ABNORMAL HIGH (ref <150)
VLDL: 33 mg/dL (ref 0–40)

## 2012-07-01 LAB — BASIC METABOLIC PANEL
CO2: 26 mEq/L (ref 19–32)
Chloride: 105 mEq/L (ref 96–112)
Glucose, Bld: 94 mg/dL (ref 70–99)
Sodium: 143 mEq/L (ref 135–145)

## 2012-07-01 LAB — CBC
HCT: 39.3 % (ref 36.0–46.0)
Hemoglobin: 13.3 g/dL (ref 12.0–15.0)
MCH: 31.3 pg (ref 26.0–34.0)
MCV: 92.5 fL (ref 78.0–100.0)
RBC: 4.25 MIL/uL (ref 3.87–5.11)
WBC: 7 10*3/uL (ref 4.0–10.5)

## 2012-07-01 MED ORDER — ESCITALOPRAM OXALATE 20 MG PO TABS
20.0000 mg | ORAL_TABLET | Freq: Every day | ORAL | Status: DC
  Administered 2012-07-01 – 2012-07-03 (×3): 20 mg via ORAL
  Filled 2012-07-01 (×3): qty 1

## 2012-07-01 MED ORDER — ASPIRIN EC 81 MG PO TBEC
81.0000 mg | DELAYED_RELEASE_TABLET | Freq: Every day | ORAL | Status: DC
  Administered 2012-07-01: 81 mg via ORAL
  Filled 2012-07-01 (×3): qty 1

## 2012-07-01 MED ORDER — CLOPIDOGREL BISULFATE 75 MG PO TABS
75.0000 mg | ORAL_TABLET | Freq: Every day | ORAL | Status: DC
  Administered 2012-07-02 – 2012-07-03 (×2): 75 mg via ORAL
  Filled 2012-07-01 (×3): qty 1

## 2012-07-01 MED ORDER — SIMVASTATIN 5 MG PO TABS
5.0000 mg | ORAL_TABLET | Freq: Every day | ORAL | Status: DC
  Administered 2012-07-01 – 2012-07-02 (×2): 5 mg via ORAL
  Filled 2012-07-01 (×3): qty 1

## 2012-07-01 MED ORDER — LEVOTHYROXINE SODIUM 75 MCG PO TABS
75.0000 ug | ORAL_TABLET | Freq: Every day | ORAL | Status: DC
  Administered 2012-07-01 – 2012-07-03 (×3): 75 ug via ORAL
  Filled 2012-07-01 (×4): qty 1

## 2012-07-01 MED ORDER — STARCH (THICKENING) PO POWD
ORAL | Status: DC | PRN
  Filled 2012-07-01: qty 227

## 2012-07-01 NOTE — Progress Notes (Signed)
[facility] PROGRESS NOTE  PIPER HASSEBROCK UJW:[id number] DOB: 06-23-1919 DOA: 06/28/2012 PCP: Kimber Relic, MD  Assessment/Plan:   Acute deep brain Cerebrovascular Accident - present on admission - with right hemiparesis, no aphasia, moderate dysphagia. She does have a pacemaker so we will not be able to perform an MRI. Placed on aspirin PR - able to take pos on 4/29 so we did change her to aspirin 81 mg daily and Plavix 75 mg daily. Zocor 5 mg QHS started 4/29 Neurology consultation obtained 4/29 for prognosis.  Toxic Metabolic Encephalopathy persistent after admission resolved by 4/29. There is no reported seizure activity.  Ecoli UTI - initially we assumed that she was not responding to rocephin. She was changed to zosyn on 4/27, but The final urine culture was positive for Escherichia coli which is essentially pan sensitive. Patient resumed Rocephin on April 28.   NSTEMI -type 2 MI   - patient was placed initially on BB iv aspirin PR because she was unable to swallow..  Transthoracic echocardiogram with preserved ejection fraction and no wall motion abnormalities. Once a diagnosis of stroke was confirmed we did stop BB as to allow for some permissive hypertension.   S/p pacer   Hypothyroidism - patient received Synthroid iv unitl she was able to take po   Dysphagia-due to altered mental status and probably stroke. D1 diet for now    Urinary retention probably from UTi and constipation - foley placed 4/29.  Depression - resumed Lexapro   Family will pursue comfort care if patient does not improve, Based on patient's prior expressed wishes to do them   Code Status: DNR Family Communication: daughter  Disposition Plan: snf   Consultants:  [facility]   Neurology  Procedures:  CT head  Echo  Antibiotics: Rocephin 4/26-  HPI/Subjective: Awake able to talk , sitting up in chair   Objective: Filed Vitals:   06/30/12 1300 06/30/12 1412 06/30/12 2054  07/01/12 0532  BP: 138/68 123/73 154/71 140/60  Pulse: 66 62 78 74  Temp:  97.5 F (36.4 C) 98.6 F (37 C) 97.6 F (36.4 C)  TempSrc:  Oral Oral Oral  Resp:  20 20 20   Height:      Weight:    84.5 kg (186 lb 4.6 oz)  SpO2:  96% 96% 96%   Patient Vitals for the past 24 hrs:  BP Temp Temp src Pulse Resp SpO2 Weight  07/01/12 0532 140/60 mmHg 97.6 F (36.4 C) Oral 74 20 96 % 84.5 kg (186 lb 4.6 oz)  06/30/12 2054 154/71 mmHg 98.6 F (37 C) Oral 78 20 96 % -  06/30/12 1412 123/73 mmHg 97.5 F (36.4 C) Oral 62 20 96 % -  06/30/12 1300 138/68 mmHg - - 66 - - -  06/30/12 1152 146/73 mmHg 98.2 F (36.8 C) Oral 67 20 96 % -     Intake/Output Summary (Last 24 hours) at 07/01/12 0930 Last data filed at 07/01/12 0806  Gross per 24 hour  Intake    825 ml  Output    650 ml  Net    175 ml   Filed Weights   06/29/12 0558 06/30/12 0626 07/01/12 0532  Weight: 83.4 kg (183 lb 13.8 oz) 84.9 kg (187 lb 2.7 oz) 84.5 kg (186 lb 4.6 oz)    Exam:   General:  Awake with eyes open,  Cardiovascular: RRR, no M  Respiratory: clear ant   Abdomen: soft, nt  Musculoskeletal: intact  Neuro right hemiparesis, no aphasia   Data Reviewed: Basic Metabolic Panel:  Recent Labs Lab 06/28/12 1252 06/29/12 0624 06/30/12 0920 07/01/12 0725  NA 140 141 141 143  K 3.8 3.9 3.9 3.8  CL 103 105 104 105  CO2 26 26 28 26   GLUCOSE 149* 134* 148* 94  BUN 11 11 10 9   CREATININE 0.59 0.55 0.60 0.55  CALCIUM 9.3 9.0 9.1 9.2   Liver Function Tests: No results found for this basename: AST, ALT, ALKPHOS, BILITOT, PROT, ALBUMIN,  in the last 168 hours No results found for this basename: LIPASE, AMYLASE,  in the last 168 hours No results found for this basename: AMMONIA,  in the last 168 hours CBC:  Recent Labs Lab 06/28/12 1252 06/29/12 0624 06/30/12 0920 07/01/12 0725  WBC 10.0 7.9 7.6 7.0  NEUTROABS 8.0*  --   --   --   HGB 13.7 12.5 13.5 13.3  HCT 39.4 36.4 38.8 39.3  MCV 89.5 90.1  91.5 92.5  PLT 150 153 140* 144*   Cardiac Enzymes:  Recent Labs Lab 06/28/12 1532 06/28/12 1640 06/28/12 2200 06/29/12 0624  TROPONINI 1.91* 0.75* 1.22* 0.77*   BNP (last 3 results) No results found for this basename: PROBNP,  in the last 8760 hours CBG:  Recent Labs Lab 06/29/12 0947 06/30/12 1357  GLUCAP 125* 94    Recent Results (from the past 240 hour(s))  URINE CULTURE     Status: None   Collection Time    06/28/12  1:20 PM      Result Value Range Status   Specimen Description URINE, CATHETERIZED   Final   Special Requests NONE   Final   Culture  Setup Time 06/28/2012 18:16   Final   Colony Count >=100,000 COLONIES/ML   Final   Culture ESCHERICHIA COLI   Final   Report Status 06/30/2012 FINAL   Final   Organism ID, Bacteria ESCHERICHIA COLI   Final     Studies: Ct Head Wo Contrast  06/29/2012  *RADIOLOGY REPORT*  Clinical Data: Fall.  Right-sided weakness.  Unresponsive patient.  CT HEAD WITHOUT CONTRAST  Technique:  Contiguous axial images were obtained from the base of the skull through the vertex without contrast.  Comparison: 06/28/2012  Findings: The brain stem, cerebellum, cerebral peduncles, thalami, basal ganglia, basilar cisterns, and ventricular system appear unremarkable.  Subtly increased hypodensity in the left parietal corona radiata noted.  No significant loss of gray-white differentiation or other discrete indicators of stroke.  No intracranial hemorrhage or obvious mass lesion.  No midline shift.  Atherosclerotic calcification of the carotid siphons noted.  IMPRESSION:  1.  Subtle hypodensity in the left parietal corona radiata/centrum semiovale appears more conspicuous than on yesterday's exam.  This could be an indicator of a small white matter infarct, inflammatory focus, or injury.  No hemorrhage identified.  MRI may help for further characterization if clinically warranted.   Original Report Authenticated By: Gaylyn Rong, M.D.    Dg Chest  Port 1 View  06/30/2012  *RADIOLOGY REPORT*  Clinical Data: 79 year old female right side weakness and cough. Possible aspiration.  PORTABLE CHEST - 1 VIEW  Comparison: 06/28/2012 and earlier.  Findings: Portable upright AP view at 1218 hours.  The patient is slightly more rotated to the left.  Stable lung volumes.  Stable cardiac size and mediastinal contours.  Left chest cardiac AICD again noted.  Stable scarring or atelectasis at the left lung base with no acute or confluent pulmonary opacity to  suggest aspiration. No pneumothorax, edema, or definite effusion.  IMPRESSION: Stable. No acute cardiopulmonary abnormality.   Original Report Authenticated By: Erskine Speed, M.D.     Scheduled Meds: . aspirin EC  81 mg Oral Daily  . cefTRIAXone (ROCEPHIN)  IV  1 g Intravenous Q24H  . [START ON 07/02/2012] clopidogrel  75 mg Oral Q breakfast  . enoxaparin (LOVENOX) injection  40 mg Subcutaneous Q24H  . escitalopram  20 mg Oral Daily  . levothyroxine  75 mcg Oral QAC breakfast  . simvastatin  5 mg Oral QHS   Continuous Infusions: . sodium chloride 50 mL/hr at 06/30/12 1830    Principal Problem:   Altered mental status Active Problems:   UTI (lower urinary tract infection)   NSTEMI (non-ST elevated myocardial infarction)   HTN (hypertension)   Dyslipidemia   Hypothyroidism   Breast cancer     Rachel Cooke  [facility] Pager [phone]. If 7PM-7AM, please contact night-coverage at [email or web address], password [facility] 07/01/2012, 9:30 AM  LOS: 3 days

## 2012-07-01 NOTE — Progress Notes (Signed)
Speech Language Pathology Dysphagia Treatment Patient Details Name: Rachel Cooke MRN: [id number] DOB: 1919-05-05 Today's Date: 07/01/2012 Time: 0827-0900 SLP Time Calculation (min): 33 min  Assessment / Plan / Recommendation Clinical Impression  Pt. much more alert this am, sitting in recliner with son-in-law.  Delayed swallow suspected, multiple swallows and audible swallow x 2. S/S aspiration with thin include immediate and delayed throat clearing.  Throat clearing was decreased with nectar consistency.  She became sleepy towards end of session.  SLP recommends Dys 1 diet texture and nectar thick liquids only when alert, crush meds, no straws.  ST will continue to follow for tolerance of diet and need for MBS.  She would also benefit from a speech-language-cognitive assessment (will need order).    Diet Recommendation  Initiate / Change Diet: Dysphagia 1 (puree);Nectar-thick liquid    SLP Plan Continue with current plan of care   Pertinent Vitals/Pain none   Swallowing Goals  SLP Swallowing Goals Patient will utilize recommended strategies during swallow to increase swallowing safety with: Moderate cueing Goal #3: Pt. will increase arousal and orally manipulate bolus to initiate pharyngeal swallow to determine safe po intake with mod verbal cues. Swallow Study Goal #3 - Progress: Met  General Temperature Spikes Noted: No Respiratory Status: Supplemental O2 delivered via (comment) Behavior/Cognition: Alert;Cooperative;Requires cueing Oral Cavity - Dentition: Dentures, top;Dentures, bottom Patient Positioning: Upright in chair  Oral Cavity - Oral Hygiene Does patient have any of the following "at risk" factors?: Oxygen therapy - cannula, mask, simple oxygen devices Brush patient's teeth BID with toothbrush (using toothpaste with fluoride): Yes Patient is HIGH RISK - Oral Care Protocol followed (see row info): Yes Patient is AT RISK - Oral Care Protocol followed (see row info):  Yes   Dysphagia Treatment Treatment focused on: Skilled observation of diet tolerance;Patient/family/caregiver education Family/Caregiver Educated: son-in-law Treatment Methods/Modalities: Skilled observation Patient observed directly with PO's: Yes Type of PO's observed: Dysphagia 1 (puree);Thin liquids;Nectar-thick liquids Feeding: Able to feed self;Needs assist;Needs set up Liquids provided via: Cup;No straw Oral Phase Signs & Symptoms: Prolonged oral phase Pharyngeal Phase Signs & Symptoms: Suspected delayed swallow initiation;Multiple swallows;Audible swallow;Delayed throat clear;Immediate throat clear Type of cueing: Verbal;Tactile;Visual Amount of cueing: Moderate   GO     Breck Coons [place].Ed [employer] [phone]  07/01/2012

## 2012-07-01 NOTE — Progress Notes (Signed)
Referring Physician: laza    Chief Complaint: stroke  HPI:                                                                                                                                         Rachel Cooke is an 79 y.o. female with known  hypertension, dyslipidemia, status post permanent pacemaker placement who was brought to ED on 06-28-12 due to AMS. In the chart primary team ahd spoken with family and most of this history is obtained from the patient's daughter who was at bedside. "Per the history obtained, this morning patient was found on the floor and confused at her independent living facility. She was then brought here to the emergency room, where a CT of the head and neck were done which were negative for any significant abnormalities. A urinalysis was suggestive of a UTI. She was given Rocephin, and family has noted some improvement in her mentation-while she has been here.  Daughter at bedside does indicate that the family does not desire any heroic measures, they do however desire IV antibiotics and treatment of potentially reversible conditions. At this point, the family clearly states that did not wish to pursue any aggressive intervention like cardiac cath or stress testing."  While in the hospital it was noted she was not moving her right arm as well as usual but unclear if it was her AMS or possible CVA.  After her mentation started to clear it became obvious she was not moving her right side as well. repeat CT head showed a subtle hypodensity in the left parietal corona radiata/centrum semiovale appears more conspicuous than previous scan. Neurology was asked to see patient.     Date last known well: Unable to determine Time last known well: Unable to determine tPA Given: No: unable to determine time onset  Past Medical History  Diagnosis Date  . Cancer   . Thyroid disease   . Hypertension   . High cholesterol     Past Surgical History  Procedure Laterality Date  .  Mastectomy      History reviewed. No pertinent family history. Social History:  reports that she has never smoked. She does not have any smokeless tobacco history on file. She reports that she does not drink alcohol. Her drug history is not on file.  Allergies: No Known Allergies  Medications:  Prior to Admission:  Prescriptions prior to admission  Medication Sig Dispense Refill  . acetaminophen (TYLENOL) 650 MG CR tablet Take 650 mg by mouth 2 (two) times daily.      Marland Kitchen aspirin EC 81 MG tablet Take 81 mg by mouth daily.      . beta carotene w/minerals (OCUVITE) tablet Take 2 tablets by mouth daily.      . Calcium Carbonate-Vitamin D (CALCIUM 600 + D PO) Take 1 tablet by mouth 2 (two) times daily.      . cetirizine (ZYRTEC) 10 MG tablet Take 10 mg by mouth daily as needed for allergies.      Marland Kitchen escitalopram (LEXAPRO) 20 MG tablet Take 20 mg by mouth daily.      . fenofibrate (TRICOR) 145 MG tablet Take 145 mg by mouth daily.      . furosemide (LASIX) 40 MG tablet Take 20 mg by mouth every other day.      . levothyroxine (SYNTHROID, LEVOTHROID) 75 MCG tablet Take 75 mcg by mouth daily before breakfast.      . losartan (COZAAR) 50 MG tablet Take 50 mg by mouth daily.      . metoprolol succinate (TOPROL-XL) 100 MG 24 hr tablet Take 100 mg by mouth daily. Take with or immediately following a meal.      . Multiple Vitamin (MULTIVITAMIN WITH MINERALS) TABS Take 1 tablet by mouth daily.      . niacin 500 MG CR capsule Take 1,000 mg by mouth at bedtime.      . Omega-3 Fatty Acids (FISH OIL PO) Take 1 capsule by mouth daily.      . potassium chloride (K-DUR) 10 MEQ tablet Take 10 mEq by mouth daily.      . Vitamin D, Ergocalciferol, (DRISDOL) 50000 UNITS CAPS Take 50,000 Units by mouth every 7 (seven) days.       Scheduled: . aspirin EC  81 mg Oral Daily  . cefTRIAXone  (ROCEPHIN)  IV  1 g Intravenous Q24H  . [START ON 07/02/2012] clopidogrel  75 mg Oral Q breakfast  . enoxaparin (LOVENOX) injection  40 mg Subcutaneous Q24H  . escitalopram  20 mg Oral Daily  . levothyroxine  75 mcg Oral QAC breakfast  . simvastatin  5 mg Oral q1800    ROS:                                                                                                                                       Unable to obtain due to patient being severely drowsy  Neurologic Examination:  Blood pressure 140/60, pulse 74, temperature 97.6 F (36.4 C), temperature source Oral, resp. rate 20, height 5\' 6"  (1.676 m), weight 84.5 kg (186 lb 4.6 oz), SpO2 96.00%.  Mental Status: Drowsy, opens eyes to commands and will follow only one step commands then falls asleep.  Speech only one word sentences.  Cranial Nerves: II: Discs flat bilaterally; Visual fields grossly normal, pupils equal, round, reactive to light and accommodation III,IV, VI: ptosis  Present right eye, extra-ocular motions intact bilaterally V,VII: smile symmetric, facial light touch sensation normal bilaterally VIII: hearing normal bilaterally IX,X: gag reflex present XI: bilateral shoulder shrug XII: midline tongue extension Motor: Right : Upper extremity   note    Left:     Upper extremity   5/5  Lower extremity   note    Lower extremity    --right UE is unable to lift off bed, 3/5 bicep/ 0/5 tricep/ weak grip, right leg shows 3/5 strength with dorsiflexion to pain but no other movement. Patient was very drowsy during exam and muscle strength was limited.  Tone and bulk:normal tone throughout; no atrophy noted Sensory: Pinprick and light touch intact throughout, bilaterally Deep Tendon Reflexes: 1+ and symmetric throughout Plantars: Right: mute   Left: up Cerebellar: Normal F-N-F on left and normal H-S on left.  CV: pulses  palpable throughout    Results for orders placed during the hospital encounter of 06/28/12 (from the past 48 hour(s))  CBC     Status: Abnormal   Collection Time    06/30/12  9:20 AM      Result Value Range   WBC 7.6  4.0 - 10.5 K/uL   RBC 4.24  3.87 - 5.11 MIL/uL   Hemoglobin 13.5  12.0 - 15.0 g/dL   HCT 16.1  09.6 - 04.5 %   MCV 91.5  78.0 - 100.0 fL   MCH 31.8  26.0 - 34.0 pg   MCHC 34.8  30.0 - 36.0 g/dL   RDW 40.9  81.1 - 91.4 %   Platelets 140 (*) 150 - 400 K/uL  BASIC METABOLIC PANEL     Status: Abnormal   Collection Time    06/30/12  9:20 AM      Result Value Range   Sodium 141  135 - 145 mEq/L   Potassium 3.9  3.5 - 5.1 mEq/L   Chloride 104  96 - 112 mEq/L   CO2 28  19 - 32 mEq/L   Glucose, Bld 148 (*) 70 - 99 mg/dL   BUN 10  6 - 23 mg/dL   Creatinine, Ser 7.82  0.50 - 1.10 mg/dL   Calcium 9.1  8.4 - 95.6 mg/dL   GFR calc non Af Amer 78 (*) >90 mL/min   GFR calc Af Amer >90  >90 mL/min   Comment:            The eGFR has been calculated     using the CKD EPI equation.     This calculation has not been     validated in all clinical     situations.     eGFR's persistently     <90 mL/min signify     possible Chronic Kidney Disease.  GLUCOSE, CAPILLARY     Status: None   Collection Time    06/30/12  1:57 PM      Result Value Range   Glucose-Capillary 94  70 - 99 mg/dL  BASIC METABOLIC PANEL     Status: Abnormal   Collection  Time    07/01/12  7:25 AM      Result Value Range   Sodium 143  135 - 145 mEq/L   Potassium 3.8  3.5 - 5.1 mEq/L   Chloride 105  96 - 112 mEq/L   CO2 26  19 - 32 mEq/L   Glucose, Bld 94  70 - 99 mg/dL   BUN 9  6 - 23 mg/dL   Creatinine, Ser 2.44  0.50 - 1.10 mg/dL   Calcium 9.2  8.4 - 01.0 mg/dL   GFR calc non Af Amer 80 (*) >90 mL/min   GFR calc Af Amer >90  >90 mL/min   Comment:            The eGFR has been calculated     using the CKD EPI equation.     This calculation has not been     validated in all clinical     situations.      eGFR's persistently     <90 mL/min signify     possible Chronic Kidney Disease.  CBC     Status: Abnormal   Collection Time    07/01/12  7:25 AM      Result Value Range   WBC 7.0  4.0 - 10.5 K/uL   RBC 4.25  3.87 - 5.11 MIL/uL   Hemoglobin 13.3  12.0 - 15.0 g/dL   HCT 27.2  53.6 - 64.4 %   MCV 92.5  78.0 - 100.0 fL   MCH 31.3  26.0 - 34.0 pg   MCHC 33.8  30.0 - 36.0 g/dL   RDW 03.4  74.2 - 59.5 %   Platelets 144 (*) 150 - 400 K/uL  LIPID PANEL     Status: Abnormal   Collection Time    07/01/12  7:25 AM      Result Value Range   Cholesterol 174  0 - 200 mg/dL   Triglycerides 638 (*) <150 mg/dL   HDL 42  >75 mg/dL   Total CHOL/HDL Ratio 4.1     VLDL 33  0 - 40 mg/dL   LDL Cholesterol 99  0 - 99 mg/dL   Comment:            Total Cholesterol/HDL:CHD Risk     Coronary Heart Disease Risk Table                         Men   Women      1/2 Average Risk   3.4   3.3      Average Risk       5.0   4.4      2 X Average Risk   9.6   7.1      3 X Average Risk  23.4   11.0                Use the calculated Patient Ratio     above and the CHD Risk Table     to determine the patient's CHD Risk.                ATP III CLASSIFICATION (LDL):      <100     mg/dL   Optimal      643-329  mg/dL   Near or Above                        Optimal      130-159  mg/dL   Borderline      213-086  mg/dL   High      >578     mg/dL   Very High   Ct Head Wo Contrast  06/29/2012  *RADIOLOGY REPORT*  Clinical Data: Fall.  Right-sided weakness.  Unresponsive patient.  CT HEAD WITHOUT CONTRAST  Technique:  Contiguous axial images were obtained from the base of the skull through the vertex without contrast.  Comparison: 06/28/2012  Findings: The brain stem, cerebellum, cerebral peduncles, thalami, basal ganglia, basilar cisterns, and ventricular system appear unremarkable.  Subtly increased hypodensity in the left parietal corona radiata noted.  No significant loss of gray-white differentiation or other  discrete indicators of stroke.  No intracranial hemorrhage or obvious mass lesion.  No midline shift.  Atherosclerotic calcification of the carotid siphons noted.  IMPRESSION:  1.  Subtle hypodensity in the left parietal corona radiata/centrum semiovale appears more conspicuous than on yesterday's exam.  This could be an indicator of a small white matter infarct, inflammatory focus, or injury.  No hemorrhage identified.  MRI may help for further characterization if clinically warranted.   Original Report Authenticated By: Gaylyn Rong, M.D.    Dg Chest Port 1 View  06/30/2012  *RADIOLOGY REPORT*  Clinical Data: 79 year old female right side weakness and cough. Possible aspiration.  PORTABLE CHEST - 1 VIEW  Comparison: 06/28/2012 and earlier.  Findings: Portable upright AP view at 1218 hours.  The patient is slightly more rotated to the left.  Stable lung volumes.  Stable cardiac size and mediastinal contours.  Left chest cardiac AICD again noted.  Stable scarring or atelectasis at the left lung base with no acute or confluent pulmonary opacity to suggest aspiration. No pneumothorax, edema, or definite effusion.  IMPRESSION: Stable. No acute cardiopulmonary abnormality.   Original Report Authenticated By: Erskine Speed, M.D.    2 D echo Study Conclusions  - Left ventricle: The cavity size was normal. Systolic function was normal. The estimated ejection fraction was in the range of 60% to 65%. Wall motion was normal; there were no regional wall motion abnormalities. - Aortic valve: There was mild stenosis. Trivial regurgitation. - Mitral valve: Calcified annulus. - Pulmonary arteries: Systolic pressure was mildly increased.   Assessment and plan discussed with with attending physician and they are in agreement.    Felicie Morn PA-C [facility] [phone]  07/01/2012, 9:56 AM   Assessment: 79 y.o. female with UTI associated with AMS.  Presented to ED 06-28-12 due to AMS but also  with notable right arm and leg weakness. Initial CT head negative for acute CVA but follow up CT shows hypodensity in the left parietal corona radiata/centrum semiovale in small vessel distribution. Exam shows significant weakness on right arm and leg but patient is drowsy and does not give full effort. Currently being treated for her UTI with IV ABX. Patient was on ASA at home. LDL 99, A1c pending.   Stroke Risk Factors - hyperlipidemia and hypertension  Plan:  1. PT consult, OT consult, Speech consult 2. Prophylactic therapy-Antiplatelet med: Plavix - dose 75 mg daily 3. Risk factor modification  4. Telemetry monitoring 5. Frequent neuro checks 6. Was in independent living prior--if wakes up and is able to perform tasks may be inpatient rehab candidate.

## 2012-07-01 NOTE — Progress Notes (Signed)
Physical Therapy Treatment Patient Details Name: Rachel Cooke MRN: [id number] DOB: 1919-07-17 Today's Date: 07/01/2012 Time: 1478-2956 PT Time Calculation (min): 8 min  PT Assessment / Plan / Recommendation Comments on Treatment Session  RN tech with good understanding of transfer technique toward pt's strong side with two person assist using a pad and gait belt.                         Precautions / Restrictions Precautions Precautions: Fall Precaution Comments: R sided weakness and delayed processing Restrictions Weight Bearing Restrictions: No       Mobility  Transfers Stand Pivot Transfers: 1: +2 Total assist Stand Pivot Transfers: Patient Percentage: 10% Details for Transfer Assistance: RN tech assisted PT with transfer and demo'ed good understanding of two-person transfer with pad and gait belt.    Exercises        Visit Information  Last PT Received On: 07/01/12 Assistance Needed: +2    Subjective Data  Subjective: RN tech requested PT assistance to get pt back to bed. RN tech recieved education and demo'ed understanding by assisting PT with transfer. RN also present to observe.    Cognition       Balance     End of Session PT - End of Session Equipment Utilized During Treatment: Gait belt Patient left: in bed;Other (comment);with nursing in room;with family/visitor present (EOB) Nurse Communication: Other (comment) (Transfer technique)   GP     Marvis Moeller 07/01/2012, 1:53 PM  Marvis Moeller, Student Physical Therapist Office #: [phone]  Lewis Shock, PT, DPT Pager #: [phone] Office #: [phone]

## 2012-07-01 NOTE — Evaluation (Signed)
Physical Therapy Evaluation Patient Details Name: Rachel Cooke MRN: [id number] DOB: 05-28-1919 Today's Date: 07/01/2012 Time: 7846-9629 PT Time Calculation (min): 28 min  PT Assessment / Plan / Recommendation Clinical Impression  Pt is a 79 yo female found to have a L parietal corona infarct presenting with R sided weakness, cognitive deficits, and altered mental status. Pt was modified independent at indep. living facility PTA but now requires maxA for all mobility and ADLs. Spoke with family and family aware she will need SNF upon d/c to achieve maximal functional recovery prior to transition back to ALF or ILF.    PT Assessment  Patient needs continued PT services    Follow Up Recommendations  SNF;Supervision/Assistance - 24 hour    Does the patient have the potential to tolerate intense rehabilitation      Barriers to Discharge Decreased caregiver support      Equipment Recommendations  None recommended by PT    Recommendations for Other Services     Frequency Min 4X/week    Precautions / Restrictions Precautions Precautions: Fall Precaution Comments: R sided weakness and delayed processing Restrictions Weight Bearing Restrictions: No   Pertinent Vitals/Pain Pt did not communicate pain      Mobility  Bed Mobility Bed Mobility: Right Sidelying to Sit;Sitting - Scoot to Edge of Bed Right Sidelying to Sit: 1: +2 Total assist;With rails;HOB elevated Right Sidelying to Sit: Patient Percentage: 10% Sitting - Scoot to Edge of Bed: 1: +1 Total assist Details for Bed Mobility Assistance: max directional v/c's for sequence, pt initiated use of L hand on rail with v/c's but did not use for transfer. maxA for trunk elevation and LE management of edge of bed Transfers Transfers: Sit to Stand;Stand to Sit;Stand Pivot Transfers Sit to Stand: 1: +2 Total assist;From bed Sit to Stand: Patient Percentage: 10% Stand to Sit: 1: +2 Total assist;To chair/3-in-1 Stand to Sit:  Patient Percentage: 10% Stand Pivot Transfers: 1: +2 Total assist Stand Pivot Transfers: Patient Percentage: 10% Details for Transfer Assistance: pt unable to take steps during std pvt transfer requiring total assist for std pvt. 2 person assist using gait belt and bed pad Ambulation/Gait Ambulation/Gait Assistance: Not tested (comment) Modified Rankin (Stroke Patients Only) Pre-Morbid Rankin Score: Moderate disability Modified Rankin: Severe disability    Exercises     PT Diagnosis: Abnormality of gait (R sided weakness)  PT Problem List: Decreased strength;Decreased activity tolerance;Decreased balance;Decreased mobility;Decreased coordination;Decreased cognition;Decreased knowledge of use of DME PT Treatment Interventions: DME instruction;Gait training;Stair training;Functional mobility training;Therapeutic activities;Therapeutic exercise;Balance training;Neuromuscular re-education   PT Goals Acute Rehab PT Goals PT Goal Formulation: With patient/family Time For Goal Achievement: 07/15/12 Potential to Achieve Goals: Fair Pt will go Supine/Side to Sit: with mod assist;with rail PT Goal: Supine/Side to Sit - Progress: Goal set today Pt will Sit at Edge of Bed: with modified independence;3-5 min;with no upper extremity support PT Goal: Sit at Edge Of Bed - Progress: Goal set today Pt will go Sit to Supine/Side: with mod assist PT Goal: Sit to Supine/Side - Progress: Goal set today Pt will go Sit to Stand: with mod assist;with upper extremity assist (up to RW) PT Goal: Sit to Stand - Progress: Goal set today Pt will Transfer Bed to Chair/Chair to Bed: with mod assist (with or without RW) PT Transfer Goal: Bed to Chair/Chair to Bed - Progress: Goal set today Pt will Stand: with mod assist;1 - 2 min;with unilateral upper extremity support PT Goal: Stand - Progress: Goal set today Pt  will Ambulate: 1 - 15 feet;with mod assist;with rolling walker PT Goal: Ambulate - Progress: Goal set  today  Visit Information  Last PT Received On: 07/01/12 Assistance Needed: +2    Subjective Data  Subjective: Pt received supine in bed with lethargy. Easily aroused. Son-in-law, Al, present to provide PTA mobility.   Prior Functioning  Home Living Lives With: Other (Comment) (independent living) Available Help at Discharge:  (plans to go to SNF upon d/c) Type of Home: Independent living facility Home Access: Level entry Home Layout: One level Prior Function Level of Independence: Independent with assistive device(s) Driving: No Vocation: Retired Comments: pt used RW for ambulation, completed sponge bathing and dressing independently but took a long time Musician: Expressive difficulties;Other (comment) (delayed proccessing/response time with perseveration)    Cognition  Cognition Arousal/Alertness: Lethargic (increased alertness once up in chair) Behavior During Therapy: Flat affect Overall Cognitive Status: Impaired/Different from baseline Area of Impairment: Orientation;Attention;Following commands;Memory;Awareness Orientation Level: Disoriented to;Place;Time;Situation Current Attention Level: Focused Memory: Decreased short-term memory Following Commands: Follows one step commands inconsistently Awareness:  (R sided neglect, decreased insight to deficits)    Extremity/Trunk Assessment Right Upper Extremity Assessment RUE ROM/Strength/Tone: Deficits;Unable to fully assess;Due to impaired cognition RUE ROM/Strength/Tone Deficits: pt with 1/5 grasp, initiated elbow flex/ext, no active Shoulder mvmt RUE Sensation: Deficits RUE Sensation Deficits: unable to test due to cognitive impairments RUE Coordination: Deficits RUE Coordination Deficits: unable to assess due to dec command follow and dec R UE strength Left Upper Extremity Assessment LUE ROM/Strength/Tone: Within functional levels LUE Sensation: WFL - Light Touch LUE Coordination: WFL - gross/fine  motor Right Lower Extremity Assessment RLE ROM/Strength/Tone: Deficits;Unable to fully assess;Due to impaired cognition RLE ROM/Strength/Tone Deficits: pt began to initiate ankle mvmt and R knee ext but not consistently to command RLE Sensation: Deficits RLE Coordination: Deficits Left Lower Extremity Assessment LLE ROM/Strength/Tone: Within functional levels LLE Sensation: WFL - Light Touch LLE Coordination: WFL - gross/fine motor Trunk Assessment Trunk Assessment: Normal   Balance Balance Balance Assessed: Yes Static Sitting Balance Static Sitting - Balance Support: Left upper extremity supported;Feet supported Static Sitting - Level of Assistance: 5: Stand by assistance Static Sitting - Comment/# of Minutes: 5, close supervision but no apparent leaning, able to maintain midline  End of Session PT - End of Session Equipment Utilized During Treatment: Gait belt Activity Tolerance: Patient tolerated treatment well Patient left: in chair;with call bell/phone within reach;with family/visitor present (speech in room) Nurse Communication: Mobility status (educated Hydrologist on proper transfer tech using gait belt and bed pad, RN tech returned demo'd good technique)  GP     Marcene Brawn 07/01/2012, 9:24 AM  Lewis Shock, PT, DPT Pager #: [phone] Office #: [phone]

## 2012-07-01 NOTE — Progress Notes (Signed)
Physical Therapy Treatment Patient Details Name: Rachel Cooke [facility] MRN: [id number] DOB: 05-Mar-1920 Today's Date: 07/01/2012 Time: 9528-4132 PT Time Calculation (min): 8 min  PT Assessment / Plan / Recommendation Comments on Treatment Session  RN tech with good understanding of transfer technique toward pt's strong side with two person assist using a pad and gait belt.    Follow Up Recommendations        Does the patient have the potential to tolerate intense rehabilitation     Barriers to Discharge        Equipment Recommendations       Recommendations for Other Services    Frequency     Plan      Precautions / Restrictions Precautions Precautions: Fall Precaution Comments: R sided weakness and delayed processing Restrictions Weight Bearing Restrictions: No   Pertinent Vitals/Pain 0/10    Mobility  Transfers Stand Pivot Transfers: 1: +2 Total assist Stand Pivot Transfers: Patient Percentage: 10% Details for Transfer Assistance: RN tech assisted PT with transfer and demo'ed good understanding of two-person transfer with pad and gait belt.    Exercises     PT Diagnosis:    PT Problem List:   PT Treatment Interventions:     PT Goals    Visit Information  Last PT Received On: 07/01/12 Assistance Needed: +2    Subjective Data  Subjective: RN tech requested PT assistance to get pt back to bed. RN tech recieved education and demo'ed understanding by assisting PT with transfer. RN also present to observe.    Cognition       Balance     End of Session PT - End of Session Equipment Utilized During Treatment: Gait belt Patient left: in bed;Other (comment);with nursing in room;with family/visitor present (EOB) Nurse Communication: Other (comment) (Transfer technique)   GP     Marvis Moeller 07/01/2012, 1:53 PM  Marvis Moeller, Student Physical Therapist Office #: [phone]

## 2012-07-01 NOTE — Progress Notes (Signed)
Pt not voiding since this AM. Bladder scanned with 642 ml, K. Schorr was notified and ordered to insert foley catheter. Inserted FC Fr 16 aseptically and was able to get 650 ml urine output. Will continue to monitor pt.

## 2012-07-02 DIAGNOSIS — I10 Essential (primary) hypertension: Secondary | ICD-10-CM

## 2012-07-02 MED ORDER — ENSURE PUDDING PO PUDG
1.0000 | Freq: Three times a day (TID) | ORAL | Status: DC
  Administered 2012-07-02 – 2012-07-03 (×4): 1 via ORAL

## 2012-07-02 NOTE — Progress Notes (Signed)
NUTRITION FOLLOW UP  Intervention:   1. Ensure Pudding po TID, each supplement provides 170 kcal and 4 grams of protein. RN to give meds in pudding.    Nutrition Dx:   Inadequate oral intake now related recent CVA as evidenced by 25% meal completion.   Goal:   Diet advance vs EN nutrition. Met. Diet has been advanced.  New Goal: Pt to meet >/=90% estimated nutrition needs.   Monitor:   Diet tolerance, weight trends, labs   Assessment:   Acute deep brain Cerebrovascular Accident present on admission. Continues to be treated for UTI. Pt seen by SLP, diet was advanced to D1-nectar. Likely will need an MBS. Being evaluated for possible transfer to CIR.   Spoke with pt and daughter, pt is eating ok at this time. Ate well for breakfast, was only able to get in a few bites of lunch before going for procedure. RN reports giving meds in applesauce. Agreeable to meds in Ensure Pudding to increase kcal/protein intake.   Height: Ht Readings from Last 1 Encounters:  06/28/12 5\' 6"  (1.676 m)    Weight Status:   Wt Readings from Last 1 Encounters:  07/02/12 185 lb 13.6 oz (84.3 kg)    Re-estimated needs:  Kcal: 1600-1800  Protein: 70-80 gm  Fluid: 1.6-1.8 L   Skin: intact   Diet Order: Dysphagia 1, nectar thick liquids   Intake/Output Summary (Last 24 hours) at 07/02/12 1510 Last data filed at 07/02/12 1504  Gross per 24 hour  Intake   1230 ml  Output    825 ml  Net    405 ml    Last BM: 4/30   Labs:   Recent Labs Lab 06/29/12 0624 06/30/12 0920 07/01/12 0725  NA 141 141 143  K 3.9 3.9 3.8  CL 105 104 105  CO2 26 28 26   BUN 11 10 9   CREATININE 0.55 0.60 0.55  CALCIUM 9.0 9.1 9.2  GLUCOSE 134* 148* 94    CBG (last 3)   Recent Labs  06/30/12 1357  GLUCAP 94    Scheduled Meds: . cefTRIAXone (ROCEPHIN)  IV  1 g Intravenous Q24H  . clopidogrel  75 mg Oral Q breakfast  . enoxaparin (LOVENOX) injection  40 mg Subcutaneous Q24H  . escitalopram  20 mg Oral  Daily  . levothyroxine  75 mcg Oral QAC breakfast  . simvastatin  5 mg Oral q1800    Continuous Infusions: . sodium chloride 50 mL/hr at 07/01/12 1900    Clarene Duke RD, LDN Pager [phone] After Hours pager [phone]

## 2012-07-02 NOTE — Clinical Social Work Note (Signed)
CSW reviewed chart and staffed with RNCM.  Pt has CIR consult ordered and SNF will be b/u.  CSW will follow.  Vickii Penna, LCSWA [phone]  Clinical Social Work

## 2012-07-02 NOTE — Consult Note (Signed)
Physical Medicine and Rehabilitation Consult  Reason for Consult: Right sided weakness, cognitive deficits Referring Physician:  Dr. Pearlean Brownie.   HPI: Rachel Cooke is a 80 y.o. female with history of HTN, PPM, who was admitted on 06/28/12 past being  found a fall, somnolence and confusion at her ALF. Head CT negative. Patient  Started on antibiotic for ecoli UTI and cardiology consulted for input due to elevated cardiac markers and question of syncope. Cardiac echo with EF 60-65% and elevated troponin likely due to metabolic stress. As mentation improve she was noted to have right hemiparesis on 04/27. Repeat CCT revealed subtle hypodensity in left parietal corona radiata and neurology recommended changing patient to plavix for secondary stroke prevention. Therapies initiated and patient with limitations due to right sided weakness, cognitive deficits with delayed processing. On D1, nectar liquids due to s/s of aspiration.  MD recommending CIR.   Family was planning on moving patient to ALF prior to admission and plans on SNF past discharge.   Review of Systems  Unable to perform ROS: mental acuity   Past Medical History  Diagnosis Date  . Cancer   . Thyroid disease   . Hypertension   . High cholesterol    Past Surgical History  Procedure Laterality Date  . Mastectomy     History reviewed. No pertinent family history.  Social History:  Lives at [facility]. Sedentary but independent with walker PTA. Per reports that she has never smoked. She does not have any smokeless tobacco history on file. Per reports that she does not drink alcohol. Her drug history is not on file.  Allergies: No Known Allergies  Medications Prior to Admission  Medication Sig Dispense Refill  . acetaminophen (TYLENOL) 650 MG CR tablet Take 650 mg by mouth 2 (two) times daily.      Marland Kitchen aspirin EC 81 MG tablet Take 81 mg by mouth daily.      . beta carotene w/minerals (OCUVITE) tablet Take 2 tablets by mouth  daily.      . Calcium Carbonate-Vitamin D (CALCIUM 600 + D PO) Take 1 tablet by mouth 2 (two) times daily.      . cetirizine (ZYRTEC) 10 MG tablet Take 10 mg by mouth daily as needed for allergies.      Marland Kitchen escitalopram (LEXAPRO) 20 MG tablet Take 20 mg by mouth daily.      . fenofibrate (TRICOR) 145 MG tablet Take 145 mg by mouth daily.      . furosemide (LASIX) 40 MG tablet Take 20 mg by mouth every other day.      . levothyroxine (SYNTHROID, LEVOTHROID) 75 MCG tablet Take 75 mcg by mouth daily before breakfast.      . losartan (COZAAR) 50 MG tablet Take 50 mg by mouth daily.      . metoprolol succinate (TOPROL-XL) 100 MG 24 hr tablet Take 100 mg by mouth daily. Take with or immediately following a meal.      . Multiple Vitamin (MULTIVITAMIN WITH MINERALS) TABS Take 1 tablet by mouth daily.      . niacin 500 MG CR capsule Take 1,000 mg by mouth at bedtime.      . Omega-3 Fatty Acids (FISH OIL PO) Take 1 capsule by mouth daily.      . potassium chloride (K-DUR) 10 MEQ tablet Take 10 mEq by mouth daily.      . Vitamin D, Ergocalciferol, (DRISDOL) 50000 UNITS CAPS Take 50,000 Units by mouth every 7 (seven) days.  Home: Home Living Lives With: Other (Comment) (independent living) Available Help at Discharge:  (plans to go to SNF upon d/c) Type of Home: Independent living facility Home Access: Level entry Home Layout: One level  Functional History: Prior Function Driving: No Vocation: Retired Comments: pt used RW for ambulation, completed sponge bathing and dressing independently but took a long time Functional Status:  Mobility: Bed Mobility Bed Mobility: Right Sidelying to Sit;Sitting - Scoot to Edge of Bed Right Sidelying to Sit: 1: +2 Total assist;With rails;HOB elevated Right Sidelying to Sit: Patient Percentage: 10% Sitting - Scoot to Edge of Bed: 1: +1 Total assist Transfers Transfers: Sit to Stand;Stand to Sit;Stand Pivot Transfers Sit to Stand: 1: +2 Total assist;From  bed Sit to Stand: Patient Percentage: 10% Stand to Sit: 1: +2 Total assist;To chair/3-in-1 Stand to Sit: Patient Percentage: 10% Stand Pivot Transfers: 1: +2 Total assist Stand Pivot Transfers: Patient Percentage: 10% Ambulation/Gait Ambulation/Gait Assistance: Not tested (comment)    ADL:    Cognition: Cognition Overall Cognitive Status: Impaired/Different from baseline Arousal/Alertness: Lethargic (increased alertness once up in chair) Orientation Level: Oriented to person;Oriented to place Cognition Arousal/Alertness: Lethargic (increased alertness once up in chair) Behavior During Therapy: Flat affect Overall Cognitive Status: Impaired/Different from baseline Area of Impairment: Orientation;Attention;Following commands;Memory;Awareness Orientation Level: Disoriented to;Place;Time;Situation Current Attention Level: Focused Memory: Decreased short-term memory Following Commands: Follows one step commands inconsistently Awareness:  (R sided neglect, decreased insight to deficits)  Blood pressure 164/66, pulse 84, temperature 98.2 F (36.8 C), temperature source Oral, resp. rate 20, height 5\' 6"  (1.676 m), weight 84.3 kg (185 lb 13.6 oz), SpO2 96.00%. Physical Exam  Nursing note and vitals reviewed. Constitutional: She appears well-developed and well-nourished. She appears lethargic. She is easily aroused.  HENT:  Head: Normocephalic and atraumatic.  Eyes: Pupils are equal, round, and reactive to light.  Neck: Normal range of motion.  Cardiovascular: Normal rate and regular rhythm.   Pulmonary/Chest: Effort normal and breath sounds normal.  Abdominal: Soft. Bowel sounds are normal. She exhibits no distension. There is no tenderness.  Neurological: She is easily aroused. She appears lethargic.  Unable to stay awake without cues. Speech without dysarthria.  Oriented to self only. Able to follow simple one step commands. Right hemiparesis.    No results found for this or any  previous visit (from the past 24 hour(s)). Dg Chest Port 1 View  06/30/2012  *RADIOLOGY REPORT*  Clinical Data: 79 year old female right side weakness and cough. Possible aspiration.  PORTABLE CHEST - 1 VIEW  Comparison: 06/28/2012 and earlier.  Findings: Portable upright AP view at 1218 hours.  The patient is slightly more rotated to the left.  Stable lung volumes.  Stable cardiac size and mediastinal contours.  Left chest cardiac AICD again noted.  Stable scarring or atelectasis at the left lung base with no acute or confluent pulmonary opacity to suggest aspiration. No pneumothorax, edema, or definite effusion.  IMPRESSION: Stable. No acute cardiopulmonary abnormality.   Original Report Authenticated By: Erskine Speed, M.D.     Assessment/Plan: Diagnosis: left parietal/corona radiata infarct  79YO at ALF requiring max to total assist currently. Pt and family already looking at SNF, and this venue appears most appropriate to me for ongoing therapies.   Ranelle Oyster, MD, [place] [facility]    07/02/2012

## 2012-07-02 NOTE — Progress Notes (Signed)
Stroke Team Progress Note  HISTORY Rachel Cooke is an 79 y.o. female with known hypertension, dyslipidemia, status post permanent pacemaker placement who was brought to ED on 06-28-12 due to AMS. In the chart primary team ahd spoken with family and most of this history is obtained from the patient's daughter who was at bedside. "Per the history obtained, this morning patient was found on the floor and confused at her independent living facility. She was then brought here to the emergency room, where a CT of the head and neck were done which were negative for any significant abnormalities. A urinalysis was suggestive of a UTI. She was given Rocephin, and family has noted some improvement in her mentation-while she has been here. Daughter at bedside does indicate that the family does not desire any heroic measures, they do however desire IV antibiotics and treatment of potentially reversible conditions. At this point, the family clearly states that did not wish to pursue any aggressive intervention like cardiac cath or stress testing."   While in the hospital it was noted she was not moving her right arm as well as usual but unclear if it was her AMS or possible CVA. After her mentation started to clear it became obvious she was not moving her right side as well. repeat CT head showed a subtle hypodensity in the left parietal corona radiata/centrum semiovale appears more conspicuous than previous scan. Neurology was asked to see patient.   Patient was not a TPA candidate secondary to stroke not considered/recognized on admission. She was admitted for further evaluation and treatment.  SUBJECTIVE Her son and son-in-law are at the bedside.  Overall her son-in-law feels her condition is gradually improving. Lives in independent living, [place]. Walks with a walker. Does on ADLs. Walks to dining room.   OBJECTIVE Most recent Vital Signs: Filed Vitals:   07/01/12 1500 07/01/12 2047 07/02/12 0534  07/02/12 0800  BP: 155/58 141/68 164/66   Pulse: 80 86 84   Temp: 97.8 F (36.6 C) 98.7 F (37.1 C) 98.2 F (36.8 C)   TempSrc: Oral Oral Oral   Resp: 20 20 20    Height:      Weight:   84.3 kg (185 lb 13.6 oz)   SpO2: 97% 95% 94% 96%   CBG (last 3)   Recent Labs  06/30/12 1357  GLUCAP 94   IV Fluid Intake:   . sodium chloride 50 mL/hr at 07/01/12 1900   MEDICATIONS  . aspirin EC  81 mg Oral Daily  . cefTRIAXone (ROCEPHIN)  IV  1 g Intravenous Q24H  . clopidogrel  75 mg Oral Q breakfast  . enoxaparin (LOVENOX) injection  40 mg Subcutaneous Q24H  . escitalopram  20 mg Oral Daily  . levothyroxine  75 mcg Oral QAC breakfast  . simvastatin  5 mg Oral q1800   PRN:  acetaminophen, acetaminophen, albuterol, alum & mag hydroxide-simeth, bisacodyl, food thickener  Diet:  Dysphagia 1 nectar thick liquids Activity:   Up with assistance DVT Prophylaxis:  Lovenox 40 mg sq daily   CLINICALLY SIGNIFICANT STUDIES Basic Metabolic Panel:  Recent Labs Lab 06/30/12 0920 07/01/12 0725  NA 141 143  K 3.9 3.8  CL 104 105  CO2 28 26  GLUCOSE 148* 94  BUN 10 9  CREATININE 0.60 0.55  CALCIUM 9.1 9.2   Liver Function Tests: No results found for this basename: AST, ALT, ALKPHOS, BILITOT, PROT, ALBUMIN,  in the last 168 hours CBC:  Recent Labs Lab 06/28/12 1252  06/30/12 0920 07/01/12 0725  WBC 10.0  < > 7.6 7.0  NEUTROABS 8.0*  --   --   --   HGB 13.7  < > 13.5 13.3  HCT 39.4  < > 38.8 39.3  MCV 89.5  < > 91.5 92.5  PLT 150  < > 140* 144*  < > = values in this interval not displayed.  Coagulation:  Recent Labs Lab 06/29/12 0624  LABPROT 14.3  INR 1.13   Cardiac Enzymes:  Recent Labs Lab 06/28/12 1640 06/28/12 2200 06/29/12 0624  TROPONINI 0.75* 1.22* 0.77*   Urinalysis:  Recent Labs Lab 06/28/12 1320  COLORURINE YELLOW  LABSPEC 1.020  PHURINE 5.0  GLUCOSEU NEGATIVE  HGBUR SMALL*  BILIRUBINUR NEGATIVE  KETONESUR NEGATIVE  PROTEINUR NEGATIVE   UROBILINOGEN 0.2  NITRITE POSITIVE*  LEUKOCYTESUR SMALL*   Lipid Panel    Component Value Date/Time   CHOL 174 07/01/2012 0725   TRIG 167* 07/01/2012 0725   HDL 42 07/01/2012 0725   CHOLHDL 4.1 07/01/2012 0725   VLDL 33 07/01/2012 0725   LDLCALC 99 07/01/2012 0725   HgbA1C  Lab Results  Component Value Date   HGBA1C 6.2* 07/01/2012    Urine Drug Screen:   No results found for this basename: labopia, cocainscrnur, labbenz, amphetmu, thcu, labbarb    Alcohol Level: No results found for this basename: ETH,  in the last 168 hours  CT of the brain   06/29/2012   1.  Subtle hypodensity in the left parietal corona radiata/centrum semiovale appears more conspicuous than on yesterday's exam.  This could be an indicator of a small white matter infarct, inflammatory focus, or injury.  No hemorrhage identified.   06/28/2012  No acute abnormality.    CT Cervical Spine 06/28/2012  Mild degenerative change.  Negative for fracture.     MRI/A of the brain  pacer  2D Echocardiogram  EF 60-65% with no source of embolus.   Carotid Doppler    CXR   06/30/2012   Stable. No acute cardiopulmonary abnormality. 06/28/2012 Cardiomegaly without evidence of acute cardiopulmonary disease.    EKG  normal sinus rhythm.   Therapy Recommendations CIR  Physical Exam   Frail elderly [pleasant Caucasian lady not in distress.Awake alert. Afebrile. Head is nontraumatic. Neck is supple without bruit. Hearing is normal. Cardiac exam no murmur or gallop. Lungs are clear to auscultation. Distal pulses are well felt. Neurological Exam: Awake alert oriented x3. Diminished attention, registration and recall. Speech is slightly nonfluent but no aphasia, dysarthria or apraxia. Follows two-step commands well. Extraocular movements are full range without nystagmus. Fundi were not visualized. Vision acuity seems adequate. Visual fields appear full. Face is symmetric. Tongue is midline. Palatal movements are normal. Motor system  exam reveals dense right hemiplegia with 0/5 strength on the right with diminished tone. Normal antigravity strength on the left side. Right plantar is upgoing. Left is downgoing. Gait was not tested. Diminished sensation on the right. ASSESSMENT Ms. Rachel Cooke is a 79 y.o. female presenting with acute fall, confusion in a lady who was previously mentally intact and independent with ADLs.  Initial imaging unable to confirm an acute stroke. Unable to have MRI due to pacemaker. Repeat CT demonstrates a left parietal corona radiata/centrum semiovale infarct. Infarct etiology likley small vessel disease. On aspirin 81 mg orally every day prior to admission. Now on aspirin 81 mg orally every day and clopidogrel 75 mg orally every day for secondary stroke prevention. Patient with resultant lethargy, right  hemiparesis, expressive aphasia, dysphagia. Work up underway.   Hypertension  UTI, ecoli, on abx  Hyperlipidemia, LDL 99, not on statin PTA, at goal LDL < 100  LDL 99  Diabetes, HgbA1c 6.2  Hospital day # 4  TREATMENT/PLAN  Stop aspirin. Continue clopidogrel 75 mg orally every day for secondary stroke prevention.  Carotid ultrasound  Rehab consult. Can return to [facility] when needed.  Long discussion with son and son in law  [place], MSN, RN, ANVP-BC, ANP-BC, [facility] Pager: [phone] 07/02/2012 10:08 AM  I have personally obtained a history, examined the patient, evaluated imaging results, and formulated the assessment and plan of care. I agree with the above. Delia Heady, MD

## 2012-07-02 NOTE — Progress Notes (Signed)
Physical Therapy Treatment Patient Details Name: Rachel Cooke MRN: [id number] DOB: 10-18-1919 Today's Date: 07/02/2012 Time: 0920-1000 PT Time Calculation (min): 40 min  PT Assessment / Plan / Recommendation Comments on Treatment Session       Follow Up Recommendations  SNF;Supervision/Assistance - 24 hour     Does the patient have the potential to tolerate intense rehabilitation     Barriers to Discharge        Equipment Recommendations  None recommended by PT    Recommendations for Other Services    Frequency Min 4X/week   Plan      Precautions / Restrictions Precautions Precautions: Fall Precaution Comments: R sided weakness and delayed processing Restrictions Weight Bearing Restrictions: No   Pertinent Vitals/Pain No pain reported    Mobility  Bed Mobility Bed Mobility: Supine to Sit;Sitting - Scoot to Edge of Bed Right Sidelying to Sit: 1: +2 Total assist;With rails;HOB elevated Right Sidelying to Sit: Patient Percentage: 10% Supine to Sit: HOB elevated;1: +2 Total assist Supine to Sit: Patient Percentage: 10% Sitting - Scoot to Edge of Bed: 1: +1 Total assist Details for Bed Mobility Assistance: max directional v/c's for sequence, pt initiated use of L hand on rail with v/c's but did not use for transfer. maxA for trunk elevation and LE management of edge of bed Transfers Transfers: Sit to Stand;Stand to Sit;Stand Pivot Transfers Sit to Stand: 1: +2 Total assist;Without upper extremity assist;From bed Sit to Stand: Patient Percentage: 50%, once in standing pt required tactile cues to achieve hip extension, achieve equal weight-bearing due to + R lateral lean Stand to Sit: 1: +2 Total assist;Without upper extremity assist;To chair/3-in-1 Stand to Sit: Patient Percentage: 30% Stand Pivot Transfers: 1: +2 Total assist Stand Pivot Transfers: Patient Percentage: 10% Details for Transfer Assistance: pt unable to take steps during std pvt transfer requiring total  assist for std pvt. 2 person assist using gait belt and bed pad Ambulation/Gait Ambulation/Gait Assistance: Not tested (comment)    Exercises  R UE/LE WBing exercises: worked in partial standing with OT supporting R UE in optimal weight-bearing position, PT supporting R LE in optimal WBing position and 3 rd person on L side to assist with weight-shifting and maintaining pt in partial standing position   PT Diagnosis:    PT Problem List:   PT Treatment Interventions:     PT Goals Acute Rehab PT Goals PT Goal Formulation: With patient/family Time For Goal Achievement: 07/15/12 Potential to Achieve Goals: Fair PT Goal: Supine/Side to Sit - Progress: Progressing toward goal PT Goal: Sit at Edge Of Bed - Progress: Progressing toward goal PT Goal: Sit to Supine/Side - Progress: Progressing toward goal PT Goal: Sit to Stand - Progress: Progressing toward goal PT Transfer Goal: Bed to Chair/Chair to Bed - Progress: Progressing toward goal PT Goal: Stand - Progress: Progressing toward goal  Visit Information  Last PT Received On: 07/02/12 Assistance Needed: +2 PT/OT Co-Evaluation/Treatment: Yes    Subjective Data  Subjective: Pt recieved supine in bed with lethargy. Easily aroused. Son-in-law, Al and son, Lorin Picket both present.     Cognition  Cognition Arousal/Alertness: Awake/alert Behavior During Therapy: Flat affect Overall Cognitive Status: Impaired/Different from baseline Area of Impairment: Orientation;Attention;Following commands Orientation Level: Time (kept saying her birth year not year as of today) Current Attention Level: Sustained Memory: Decreased short-term memory Following Commands: Follows one step commands inconsistently;Follows one step commands with increased time Awareness:  (right sided neglect, decreased insight to deficits)    Balance  Balance Balance Assessed: Yes Static Sitting Balance Static Sitting - Balance Support: Left upper extremity supported;Feet  supported Static Sitting - Level of Assistance:  (min guard A) Static Sitting - Comment/# of Minutes: 15 minutes; head foreward and flexed, rounded trunk--worked on facilitating upright trunk and head/neck (needed max tactile and verbal cues) Static Standing Balance Static Standing - Balance Support: Bilateral upper extremity supported Static Standing - Level of Assistance: 1: +2 Total assist (40%; ) Static Standing - Comment/# of Minutes: 5 minutes (between 2 attempts); pt stood for dep hygiene post BM. Pt required tactile cues to bilat buttocks for hip extension and use of gait belt to maintain standing  End of Session PT - End of Session Equipment Utilized During Treatment: Gait belt Activity Tolerance: Patient tolerated treatment well Patient left: in chair;with call bell/phone within reach;with family/visitor present   GP     Marvis Moeller 07/02/2012, 11:56 AM  Marvis Moeller, Student Physical Therapist Office #: [phone]  Agree with above assessment.  Lewis Shock, PT, DPT Pager #: [phone] Office #: [phone]

## 2012-07-02 NOTE — Progress Notes (Signed)
[facility] PROGRESS NOTE  Rachel Cooke:[id number] DOB: [birth year] DOA: 06/28/2012 PCP: Kimber Relic, MD  Assessment/Plan:  Acute deep brain Cerebrovascular Accident - present on admission - with right hemiparesis, no aphasia, moderate dysphagia. She does have a pacemaker so we will not be able to perform an MRI. Placed on aspirin PR initially Neurology recommends Plavix 75 mg daily. Zocor 5 mg QHS started 4/29  Neurology consultation obtained 4/29 for prognosis.   Toxic Metabolic Encephalopathy persistent after admission resolved by 4/29. There is no reported seizure activity.   Ecoli UTI - initially we assumed that she was not responding to rocephin. She was changed to zosyn on 4/27, but The final urine culture was positive for Escherichia coli which is essentially pan sensitive. Patient resumed Rocephin on April 28.   NSTEMI -type 2 MI - patient was placed initially on BB iv aspirin PR because she was unable to swallow.. Transthoracic echocardiogram with preserved ejection fraction and no wall motion abnormalities. Once a diagnosis of stroke was confirmed we did stop BB as to allow for some permissive hypertension.   S/p pacer   Hypothyroidism - On synthroid.  Dysphagia-due to altered mental status and probably stroke. D1 diet for now   Urinary retention probably from UTI and constipation - foley placed 4/29.   Depression - resumed Lexapro    Code Status: DNR Family Communication: Spoke to patient and Acupuncturist  Disposition Plan: SNF vs inpatient rehab.   Consultants:  Neurology  Procedures:  Doppler carotid  Transthoracic echocardiogram  Antibiotics:  rocephin  HPI/Subjective: No acute issues reported overnight. No new complaints. Weakness persists  Objective: Filed Vitals:   07/02/12 0534 07/02/12 0800 07/02/12 1045 07/02/12 1409  BP: 164/66  152/68 140/63  Pulse: 84  80 87  Temp: 98.2 F (36.8 C)  98.4 F (36.9 C) 98 F (36.7 C)  TempSrc:  Oral  Oral Oral  Resp: 20  20 20   Height:      Weight: 84.3 kg (185 lb 13.6 oz)     SpO2: 94% 96% 96% 98%    Intake/Output Summary (Last 24 hours) at 07/02/12 1640 Last data filed at 07/02/12 1504  Gross per 24 hour  Intake   1230 ml  Output    825 ml  Net    405 ml   Filed Weights   06/30/12 0626 07/01/12 0532 07/02/12 0534  Weight: 84.9 kg (187 lb 2.7 oz) 84.5 kg (186 lb 4.6 oz) 84.3 kg (185 lb 13.6 oz)    Exam:   General:  Pt in NAD, Alert and Awake  Cardiovascular: no mrg  Respiratory: CTA BL, no wheezes, no increased work of breathing  Abdomen: soft, NT, ND  Musculoskeletal: no cyanosis or clubbing   Data Reviewed: Basic Metabolic Panel:  Recent Labs Lab 06/28/12 1252 06/29/12 0624 06/30/12 0920 07/01/12 0725  NA 140 141 141 143  K 3.8 3.9 3.9 3.8  CL 103 105 104 105  CO2 26 26 28 26   GLUCOSE 149* 134* 148* 94  BUN 11 11 10 9   CREATININE 0.59 0.55 0.60 0.55  CALCIUM 9.3 9.0 9.1 9.2   Liver Function Tests: No results found for this basename: AST, ALT, ALKPHOS, BILITOT, PROT, ALBUMIN,  in the last 168 hours No results found for this basename: LIPASE, AMYLASE,  in the last 168 hours No results found for this basename: AMMONIA,  in the last 168 hours CBC:  Recent Labs Lab 06/28/12 1252 06/29/12 8119 06/30/12 0920 07/01/12 0725  WBC 10.0 7.9 7.6 7.0  NEUTROABS 8.0*  --   --   --   HGB 13.7 12.5 13.5 13.3  HCT 39.4 36.4 38.8 39.3  MCV 89.5 90.1 91.5 92.5  PLT 150 153 140* 144*   Cardiac Enzymes:  Recent Labs Lab 06/28/12 1532 06/28/12 1640 06/28/12 2200 06/29/12 0624  TROPONINI 1.91* 0.75* 1.22* 0.77*   BNP (last 3 results) No results found for this basename: PROBNP,  in the last 8760 hours CBG:  Recent Labs Lab 06/29/12 0947 06/30/12 1357  GLUCAP 125* 94    Recent Results (from the past 240 hour(s))  URINE CULTURE     Status: None   Collection Time    06/28/12  1:20 PM      Result Value Range Status   Specimen Description  URINE, CATHETERIZED   Final   Special Requests NONE   Final   Culture  Setup Time 06/28/2012 18:16   Final   Colony Count >=100,000 COLONIES/ML   Final   Culture ESCHERICHIA COLI   Final   Report Status 06/30/2012 FINAL   Final   Organism ID, Bacteria ESCHERICHIA COLI   Final     Studies: No results found.  Scheduled Meds: . cefTRIAXone (ROCEPHIN)  IV  1 g Intravenous Q24H  . clopidogrel  75 mg Oral Q breakfast  . enoxaparin (LOVENOX) injection  40 mg Subcutaneous Q24H  . escitalopram  20 mg Oral Daily  . feeding supplement  1 Container Oral TID BM  . levothyroxine  75 mcg Oral QAC breakfast  . simvastatin  5 mg Oral q1800   Continuous Infusions: . sodium chloride 50 mL/hr at 07/01/12 1900    Principal Problem:   CVA (cerebral infarction) Active Problems:   Altered mental status   UTI (lower urinary tract infection)   NSTEMI (non-ST elevated myocardial infarction)   HTN (hypertension)   Dyslipidemia   Hypothyroidism   Breast cancer    Time spent: > 35 minutes    Rachel Cooke  [facility] Pager [phone]. If 7PM-7AM, please contact night-coverage at [email or web address], password [facility] 07/02/2012, 4:40 PM  LOS: 4 days

## 2012-07-02 NOTE — Progress Notes (Signed)
Physical Therapy Treatment Patient Details Name: Rachel Cooke MRN: [id number] DOB: 1919-07-30 Today's Date: 07/02/2012 Time: 0920-1000 PT Time Calculation (min): 40 min  PT Assessment / Plan / Recommendation Comments on Treatment Session       Follow Up Recommendations  SNF;Supervision/Assistance - 24 hour     Does the patient have the potential to tolerate intense rehabilitation     Barriers to Discharge        Equipment Recommendations  None recommended by PT    Recommendations for Other Services    Frequency Min 4X/week   Plan      Precautions / Restrictions Precautions Precautions: Fall Precaution Comments: R sided weakness and delayed processing Restrictions Weight Bearing Restrictions: No   Pertinent Vitals/Pain No pain reported    Mobility  Bed Mobility Bed Mobility: Supine to Sit;Sitting - Scoot to Edge of Bed Right Sidelying to Sit: 1: +2 Total assist;With rails;HOB elevated Right Sidelying to Sit: Patient Percentage: 10% Supine to Sit: HOB elevated;1: +2 Total assist Supine to Sit: Patient Percentage: 10% Sitting - Scoot to Edge of Bed: 1: +1 Total assist Details for Bed Mobility Assistance: max directional v/c's for sequence, pt initiated use of L hand on rail with v/c's but did not use for transfer. maxA for trunk elevation and LE management of edge of bed Transfers Transfers: Sit to Stand;Stand to Sit;Stand Pivot Transfers Sit to Stand: 1: +2 Total assist;Without upper extremity assist;From bed Sit to Stand: Patient Percentage: 50% Stand to Sit: 1: +2 Total assist;Without upper extremity assist;To chair/3-in-1 Stand to Sit: Patient Percentage: 30% Stand Pivot Transfers: 1: +2 Total assist Stand Pivot Transfers: Patient Percentage: 10% Details for Transfer Assistance: pt unable to take steps during std pvt transfer requiring total assist for std pvt. 2 person assist using gait belt and bed pad Ambulation/Gait Ambulation/Gait Assistance: Not tested  (comment)    Exercises     PT Diagnosis:    PT Problem List:   PT Treatment Interventions:     PT Goals Acute Rehab PT Goals PT Goal Formulation: With patient/family Time For Goal Achievement: 07/15/12 Potential to Achieve Goals: Fair PT Goal: Supine/Side to Sit - Progress: Progressing toward goal PT Goal: Sit at Edge Of Bed - Progress: Progressing toward goal PT Goal: Sit to Supine/Side - Progress: Progressing toward goal PT Goal: Sit to Stand - Progress: Progressing toward goal PT Transfer Goal: Bed to Chair/Chair to Bed - Progress: Progressing toward goal PT Goal: Stand - Progress: Progressing toward goal  Visit Information  Last PT Received On: 07/02/12 Assistance Needed: +2 PT/OT Co-Evaluation/Treatment: Yes    Subjective Data  Subjective: Pt recieved supine in bed with lethargy. Easily aroused. Son-in-law, Rachel Cooke and son, Rachel Cooke both present.     Cognition  Cognition Arousal/Alertness: Awake/alert Behavior During Therapy: Flat affect Overall Cognitive Status: Impaired/Different from baseline Area of Impairment: Orientation;Attention;Following commands Orientation Level: Time (kept saying her birth year not year as of today) Current Attention Level: Sustained Memory: Decreased short-term memory Following Commands: Follows one step commands inconsistently;Follows one step commands with increased time Awareness:  (right sided neglect, decreased insight to deficits)    Balance  Balance Balance Assessed: Yes Static Sitting Balance Static Sitting - Balance Support: Left upper extremity supported;Feet supported Static Sitting - Level of Assistance:  (min guard A) Static Sitting - Comment/# of Minutes: 15 minutes; head foreward and flexed, rounded trunk--worked on facilitating upright trunk and head/neck (needed max tactile and verbal cues) Static Standing Balance Static Standing - Balance Support:  Bilateral upper extremity supported Static Standing - Level of Assistance: 1:  +2 Total assist (40%; ) Static Standing - Comment/# of Minutes: 5 minutes (between 2 attempts); with arms propped on arm rests of low chair in front of her-RUE fully supported  End of Session PT - End of Session Equipment Utilized During Treatment: Gait belt Activity Tolerance: Patient tolerated treatment well Patient left: in chair;with call bell/phone within reach;with family/visitor present   GP     Rachel Cooke 07/02/2012, 11:56 AM  Rachel Cooke, Student Physical Therapist Office #: [phone]

## 2012-07-02 NOTE — Evaluation (Signed)
Occupational Therapy Evaluation Patient Details Name: Rachel Cooke MRN: [id number] DOB: April 22, 1919 Today's Date: 07/02/2012 Time: 0920-1000 OT Time Calculation (min): 40 min  OT Assessment / Plan / Recommendation Clinical Impression  This 79 yo female admitted with right sided weakness, AMS, cognitive deficits and found to have a L parietal corona infarct presents to acute OT with problems below. Will benefit from acute OT with follow up on CIR.    OT Assessment  Patient needs continued OT Services    Follow Up Recommendations  CIR    Barriers to Discharge Decreased caregiver support    Equipment Recommendations   (deferred to next venue)       Frequency  Min 2X/week    Precautions / Restrictions Precautions Precautions: Fall Precaution Comments: R sided weakness and delayed processing Restrictions Weight Bearing Restrictions: No       ADL  Eating/Feeding: Simulated;Maximal assistance Where Assessed - Eating/Feeding: Bed level Grooming: Simulated;Maximal assistance Where Assessed - Grooming: Supported sitting Upper Body Bathing: Simulated;Maximal assistance Where Assessed - Upper Body Bathing: Supported sitting Lower Body Bathing: Simulated;+1 Total assistance (with +2 partial sit to stand) Where Assessed - Lower Body Bathing: Supported sit to stand Upper Body Dressing: Simulated;+1 Total assistance Where Assessed - Upper Body Dressing: Supported sitting Lower Body Dressing: Simulated;+1 Total assistance (with +2 partial sit to stand) Where Assessed - Lower Body Dressing: Supported sit to Pharmacist, hospital: Chief of Staff: Patient Percentage: 30% (going to pt's left) Toilet Transfer Method: Ambulance person:  Hydrologist) Toileting - Architect and Hygiene: Performed;+1 Total assistance (with +2 partial sit to stand  (so really +3)) Transfers/Ambulation Related to ADLs: total A +2 (pt=50%) sit to  stand; (30%) stand to sit--decrease control for descent; not ambulatory at present    OT Diagnosis: Generalized weakness;Cognitive deficits;Disturbance of vision;Hemiplegia dominant side  OT Problem List: Decreased strength;Impaired balance (sitting and/or standing);Decreased cognition;Decreased safety awareness;Impaired vision/perception;Impaired tone;Impaired UE functional use OT Treatment Interventions: Self-care/ADL training;Neuromuscular education;Balance training;Therapeutic activities;Patient/family education;DME and/or AE instruction;Cognitive remediation/compensation;Visual/perceptual remediation/compensation   OT Goals Acute Rehab OT Goals OT Goal Formulation: With patient Time For Goal Achievement: 07/16/12 Potential to Achieve Goals: Good ADL Goals Pt Will Perform Eating: with min assist;Supported (10 bites) ADL Goal: Eating - Progress: Goal set today Pt Will Perform Grooming: with mod assist;Supported;Sitting, chair ADL Goal: Grooming - Progress: Goal set today Miscellaneous OT Goals Miscellaneous OT Goal #1: Pt will roll left with Mod A to A with BADLs OT Goal: Miscellaneous Goal #1 - Progress: Goal set today Miscellaneous OT Goal #2: Pt will initiate rolling to the right when asked. OT Goal: Miscellaneous Goal #2 - Progress: Goal set today Miscellaneous OT Goal #3: Pt will be Mod A to come up to sit from right side lying in prep for transfers out of bed. Miscellaneous OT Goal #4: Pt will be able to locate items asked of her with min VCs. OT Goal: Miscellaneous Goal #4 - Progress: Goal set today  Visit Information  Last OT Received On: 07/02/12 Assistance Needed: +2 PT/OT Co-Evaluation/Treatment: Yes       Prior Functioning     Dominant Hand: Right         Vision/Perception Vision - Assessment Eye Alignment: Within Functional Limits Vision Assessment: Vision tested Ocular Range of Motion: Within Functional Limits Additional Comments: Able to track in all  quads moving head and eyes at the same time; tendency to keep head and eyes turned to the left   Cognition  Cognition Arousal/Alertness: Awake/alert Behavior During Therapy: Flat affect Overall Cognitive Status: Impaired/Different from baseline Area of Impairment: Orientation;Attention;Following commands Orientation Level: Time (kept saying her birth year not year as of today) Current Attention Level: Sustained Following Commands: Follows one step commands inconsistently;Follows one step commands with increased time    Extremity/Trunk Assessment Right Upper Extremity Assessment RUE ROM/Strength/Tone: Deficits RUE ROM/Strength/Tone Deficits: brunstrom I; PROM WNL; decreased musculature on right shoulder area due to old masectomy gives the appearance of a sublux, however none felt RUE Sensation Deficits: unable to test due to cognitive impairments RUE Coordination: Deficits RUE Coordination Deficits: Brunstromm I Left Upper Extremity Assessment LUE ROM/Strength/Tone: Within functional levels LUE Sensation: WFL - Light Touch LUE Coordination: WFL - gross/fine motor     Mobility Bed Mobility Bed Mobility: Supine to Sit;Sitting - Scoot to Edge of Bed Supine to Sit: HOB elevated;1: +2 Total assist Supine to Sit: Patient Percentage: 10% Sitting - Scoot to Edge of Bed: 1: +1 Total assist Transfers Transfers: Sit to Stand;Stand to Sit Sit to Stand: 1: +2 Total assist;Without upper extremity assist;From bed Sit to Stand: Patient Percentage: 50% Stand to Sit: 1: +2 Total assist;Without upper extremity assist;To chair/3-in-1 Stand to Sit: Patient Percentage: 30%        Balance Balance Balance Assessed: Yes Static Sitting Balance Static Sitting - Balance Support: Left upper extremity supported;Feet supported Static Sitting - Level of Assistance:  (min guard A) Static Sitting - Comment/# of Minutes: 15 minutes; head foreward and flexed, rounded trunk--worked on facilitating upright trunk  and head/neck (needed max tactile and verbal cues) Static Standing Balance Static Standing - Balance Support: Bilateral upper extremity supported Static Standing - Level of Assistance: 1: +2 Total assist (40%; ) Static Standing - Comment/# of Minutes: 5 minutes (between 2 attempts); with arms propped on arm rests of low chair in front of her-RUE fully supported   End of Session OT - End of Session Equipment Utilized During Treatment: Gait belt Activity Tolerance: Patient tolerated treatment well Patient left: in chair;with call bell/phone within reach;with family/visitor present Nurse Communication: Mobility status;Need for lift equipment       Evette Georges [phone] 07/02/2012, 11:13 AM

## 2012-07-02 NOTE — Progress Notes (Signed)
VASCULAR LAB PRELIMINARY  PRELIMINARY  PRELIMINARY  PRELIMINARY  Carotid duplex completed.    Preliminary report:  Bilateral:  No evidence of hemodynamically significant internal carotid artery stenosis.   Vertebral artery flow is antegrade.     Parrie Rasco, RVS 07/02/2012, 1:08 PM

## 2012-07-03 DIAGNOSIS — G9341 Metabolic encephalopathy: Secondary | ICD-10-CM

## 2012-07-03 MED ORDER — CLOPIDOGREL BISULFATE 75 MG PO TABS: 75.0000 mg | ORAL_TABLET | Freq: Every day | ORAL | Status: DC

## 2012-07-03 MED ORDER — SIMVASTATIN 5 MG PO TABS: 5.0000 mg | ORAL_TABLET | Freq: Every day | ORAL | Status: DC

## 2012-07-03 MED ORDER — ENSURE PUDDING PO PUDG: 1.0000 | Freq: Three times a day (TID) | ORAL | Status: DC

## 2012-07-03 NOTE — Evaluation (Signed)
Speech Language Pathology Evaluation Patient Details Name: Rachel Cooke MRN: [id number] DOB: February 26, 1920 Today's Date: 07/03/2012 Time: 7829-5621 SLP Time Calculation (min): 21 min  Problem List:  Patient Active Problem List   Diagnosis Date Noted  . CVA (cerebral infarction) 07/01/2012  . Altered mental status 06/28/2012  . UTI (lower urinary tract infection) 06/28/2012  . NSTEMI (non-ST elevated myocardial infarction) 06/28/2012  . HTN (hypertension) 06/28/2012  . Dyslipidemia 06/28/2012  . Hypothyroidism 06/28/2012  . Breast cancer 06/28/2012   Past Medical History:  Past Medical History  Diagnosis Date  . Cancer   . Thyroid disease   . Hypertension   . High cholesterol    Past Surgical History:  Past Surgical History  Procedure Laterality Date  . Mastectomy     HPI:  79 y.o. female with a PMH of hypertension, dyslipidemia, status post permanent pacemaker placement who presents today with AMS.  Patient was found on the floor and confused at her independent living facility. Initial head CT was negative.  Yesterday pt. experienced right sided weakness and second CT revealed subtle hypodensity in the left parietal corona radiata/centrumad could be an indicator of a small white matter infarct, inflammatory focus or injury.  Found to have UTI, MI.  Daughter denies any prior oropharyngeal dysphagia.     Assessment / Plan / Recommendation Clinical Impression  Speech-language-cognititve assessment completed.  Pt. living in independent living prior to admission and daughter reports her memory was starting to decline (thinks she may have not been taking her cholesterol meds).  Pt. imroving in alertness and cognitive ability since initial swallow assessment 4/28.  She demonstrates cognitive impairments in the areas of problem solving, awareness, memory and exhibits right inattention.  In general conversation she does not exhibit overt imapirments with comrehension or expression.  ST  will defer therapy to the skilled unit at [facility] for cognitive deficits (and dysphagia) as she may be leaving today.Marland Kitchen    SLP Assessment  All further Speech Lanaguage Pathology  needs can be addressed in the next venue of care    Follow Up Recommendations  Skilled Nursing facility    Frequency and Duration        Pertinent Vitals/Pain none       SLP Evaluation Prior Functioning  Cognitive/Linguistic Baseline: Baseline deficits Baseline deficit details:  (memory starting to decline per daughter) Type of Home: Independent living facility Vocation: Retired   IT consultant  Overall Cognitive Status: Impaired/Different from baseline Arousal/Alertness: Awake/alert Orientation Level: Oriented to person;Oriented to place Attention: Sustained Sustained Attention: Impaired Memory: Impaired Memory Impairment: Storage deficit;Decreased short term memory;Prospective memory;Decreased recall of new information;Retrieval deficit Decreased Short Term Memory: Verbal basic;Functional basic Awareness: Impaired Awareness Impairment: Intellectual impairment;Emergent impairment;Anticipatory impairment Problem Solving: Impaired Problem Solving Impairment: Verbal basic Safety/Judgment: Impaired    Comprehension  Auditory Comprehension Overall Auditory Comprehension: Appears within functional limits for tasks assessed Yes/No Questions: Within Functional Limits Conversation: Simple Interfering Components: Attention;Processing speed EffectiveTechniques: Extra processing time Visual Recognition/Discrimination Discrimination: Not tested Reading Comprehension Reading Status: Not tested    Expression Expression Primary Mode of Expression: Verbal Verbal Expression Overall Verbal Expression: Appears within functional limits for tasks assessed Initiation: No impairment Level of Generative/Spontaneous Verbalization: Sentence Naming:  (no impairment in general conversation) Pragmatics:  Impairment Impairments: Abnormal affect;Eye contact Written Expression Dominant Hand: Right Written Expression: Not tested   Oral / Motor Oral Motor/Sensory Function Overall Oral Motor/Sensory Function: Appears within functional limits for tasks assessed Motor Speech Overall Motor Speech: Appears within functional limits for  tasks assessed Respiration: Impaired Level of Impairment: Conversation Phonation: Normal Resonance: Within functional limits Articulation: Within functional limitis Intelligibility: Intelligibility reduced Word: 75-100% accurate Phrase: 75-100% accurate Sentence: 75-100% accurate Conversation: 50-74% accurate Motor Planning: Witnin functional limits   GO     Breck Coons [employer] [phone]  07/03/2012

## 2012-07-03 NOTE — Progress Notes (Signed)
Pt foley d/c'd, and tolerated well, per order of Dr. Cena Benton.  Amanda Pea, Charity fundraiser.

## 2012-07-03 NOTE — Progress Notes (Signed)
I met with patient and her two daughters at bedside. Patient not at a level to be able to tolerate intense inpt rehab and family wish to pursue SNF at [facility], her community. [phone]

## 2012-07-03 NOTE — Progress Notes (Signed)
Report called to [place], nurse at [facility].  Amanda Pea, Charity fundraiser.

## 2012-07-03 NOTE — Plan of Care (Signed)
Problem: Phase II Progression Outcomes Goal: Progress activity as tolerated unless otherwise ordered Outcome: Progressing Pt energetic about getting out of bed

## 2012-07-03 NOTE — Progress Notes (Signed)
Physical Therapy Treatment Patient Details Name: Rachel Cooke MRN: [id number] DOB: 1920-02-16 Today's Date: 07/03/2012 Time: 0726-0750 PT Time Calculation (min): 24 min  PT Assessment / Plan / Recommendation Comments on Treatment Session       Follow Up Recommendations  SNF;Supervision/Assistance - 24 hour (following IR consult, SNF remains appropriate)     Does the patient have the potential to tolerate intense rehabilitation     Barriers to Discharge        Equipment Recommendations  None recommended by PT    Recommendations for Other Services    Frequency Min 4X/week   Plan      Precautions / Restrictions Precautions Precautions: Fall Precaution Comments: R sided weakness and delayed processing Restrictions Weight Bearing Restrictions: No   Pertinent Vitals/Pain Pt denies pain    Mobility  Bed Mobility Bed Mobility: Supine to Sit;Sitting - Scoot to Edge of Bed Supine to Sit: HOB elevated;With rails;1: +2 Total assist Supine to Sit: Patient Percentage: 30% (pt reached L UE for rail and initiated movement of L UE/LE) Sitting - Scoot to Edge of Bed: 1: +1 Total assist Details for Bed Mobility Assistance: max directional v/c's for sequence, pt initiated use of L hand on rail with v/c's and used for transfer. maxA for trunk elevation and LE management of edge of bed Transfers Transfers: Sit to Stand;Stand Pivot Transfers Sit to Stand: 1: +2 Total assist;Without upper extremity assist;From bed Sit to Stand: Patient Percentage: 20% (pt was able to support 20% BW when transfering) Stand to Sit: 1: +2 Total assist;Without upper extremity assist;To chair/3-in-1 Stand to Sit: Patient Percentage: 30% Stand Pivot Transfers: 1: +2 Total assist Stand Pivot Transfers: Patient Percentage: 20% Details for Transfer Assistance: pt unable to take steps during stand pivot transfer but was able to support 20% of BW without knee buckling on either side; requires two person assist with  gait belt.     Exercises     PT Diagnosis:    PT Problem List:   PT Treatment Interventions:     PT Goals Acute Rehab PT Goals PT Goal Formulation: With patient/family Time For Goal Achievement: 07/15/12 PT Goal: Supine/Side to Sit - Progress: Progressing toward goal PT Goal: Sit at Edge Of Bed - Progress: Progressing toward goal PT Goal: Sit to Stand - Progress: Progressing toward goal PT Transfer Goal: Bed to Chair/Chair to Bed - Progress: Progressing toward goal PT Goal: Stand - Progress: Progressing toward goal  Visit Information  Last PT Received On: 07/03/12 Assistance Needed: +2    Subjective Data  Subjective: Pt recieved supine in bed. Pt more alert today and agreeable to PT   Cognition  Cognition Arousal/Alertness: Awake/alert Behavior During Therapy: Flat affect Overall Cognitive Status: Impaired/Different from baseline Area of Impairment: Orientation;Attention;Following commands Orientation Level: Disoriented to;Time Current Attention Level: Focused Memory: Decreased short-term memory Following Commands: Follows one step commands with increased time Awareness:  (R sided neglect, decreased insight to deficits) General Comments: Pt improved today; recalls todays month and day with cues at the end of session after being told the date at the beginning. Pt also participating in therapy well by counting with therapists during transfers.     Balance  Balance Balance Assessed: Yes Static Sitting Balance Static Sitting - Balance Support: Left upper extremity supported;Feet supported Static Sitting - Level of Assistance: 5: Stand by assistance Static Sitting - Comment/# of Minutes: 5 minutes head foreward and flexed, rounded trunk--worked on facilitating upright trunk and head/neck (needed max tactile and  verbal cues to achieve) Static Standing Balance Static Standing - Balance Support: Bilateral upper extremity supported Static Standing - Level of Assistance: 1: +2  Total assist (50%) Static Standing - Comment/# of Minutes: 5 minutes between 2 attempts  End of Session PT - End of Session Equipment Utilized During Treatment: Gait belt Activity Tolerance: Patient tolerated treatment well Patient left: in chair;with call bell/phone within reach;with family/visitor present Nurse Communication: Mobility status (discussed back to bed transfer with RN)   GP     Valerie Salts, Kyriakos Babler 07/03/2012, 8:25 AM Marvis Moeller, Student Physical Therapist Office #: [phone]

## 2012-07-03 NOTE — Clinical Social Work Psychosocial (Signed)
     Clinical Social Work Department BRIEF PSYCHOSOCIAL ASSESSMENT 07/03/2012  Patient:  Rachel Cooke, Rachel Cooke     Account Number:  [id number]     Admit date:  06/28/2012  Clinical Social Worker:  Tiburcio Pea  Date/Time:  07/03/2012 01:11 PM  Referred by:  Physician  Date Referred:  07/02/2012 Referred for  SNF Placement   Other Referral:   Resident of [facility]   Interview type:  Family Other interview type:   daughter    PSYCHOSOCIAL DATA Living Status:  FACILITY Admitted from facility:  [facility] Level of care:  Independent Living Primary support name:  Darlina Guys  ([phone] Primary support relationship to patient:  CHILD, ADULT Degree of support available:   Strong support    CURRENT CONCERNS Current Concerns  Post-Acute Placement   Other Concerns:   Increased level of care to SNF    SOCIAL WORK ASSESSMENT / PLAN 79 year old female- resident of [facility]- Indpendent living. She has had a CVA and now requires and increase in level of care to SNF.  Patient's family wished to pursue possibility of CIR placement but patient did not meet criteria. Ok per MD for d/c today to SNF level of care. Spoke with [employer] at [facility]. They have a SNF bed available for patient.  Fl2 completed and signed by MD.  EMS arranged.  Ok per patient and daughter for d/c back to [facility] today. Nursing notified.   Assessment/plan status:  No Further Intervention Required Other assessment/ plan:   Information/referral to community resources:   None at this time    PATIENTS/FAMILYS RESPONSE TO PLAN OF CARE: Patient has had a CVA. Patient and daughter want return to [facility] if CIR cannot be arranged.  They are pleased with d/c plan back to [facility].  Nursing is aware of d/c plan. EMS arranged.  Per Durward Mallard at [facility]- a [facility] is NOT required.  DC plan in place. CSW signing off. Lorri Frederick. Brad Lieurance, LCSWA [phone]

## 2012-07-03 NOTE — Progress Notes (Signed)
Speech Language Pathology Dysphagia Treatment Patient Details Name: Rachel Cooke MRN: [id number] DOB: [birth year] Today's Date: 07/03/2012 Time: 1005-1020 SLP Time Calculation (min): 15 min  Assessment / Plan / Recommendation Clinical Impression  Pt. seen for skilled dysphagia treatment for possible texture and liquid upgrade.  She demonstrated consistent s/s penetration/aspiration such as delayed throat clearing and delayed coughing indicating she is not safe for thin liquids.  Observed with cracker with delayed bolus formation and mastication, however feel she could tolerate a Dys 2 (minced/ground) diet and continue nectar.  Minimal verbal cues needed for small sips and check oral cavity for pocketed food.     Diet Recommendation  Initiate / Change Diet: Dysphagia 2 (fine chop);Nectar-thick liquid    SLP Plan Continue with current plan of care   Pertinent Vitals/Pain none   Swallowing Goals  SLP Swallowing Goals Patient will utilize recommended strategies during swallow to increase swallowing safety with: Moderate cueing Swallow Study Goal #2 - Progress: Progressing toward goal Goal #3: Pt. will increase arousal and orally manipulate bolus to initiate pharyngeal swallow to determine safe po intake with mod verbal cues. Swallow Study Goal #3 - Progress: Met  General Temperature Spikes Noted: No Respiratory Status: Supplemental O2 delivered via (comment) Behavior/Cognition: Alert;Cooperative;Requires cueing Oral Cavity - Dentition: Dentures, top;Dentures, bottom Patient Positioning: Upright in bed  Oral Cavity - Oral Hygiene Does patient have any of the following "at risk" factors?: Oxygen therapy - cannula, mask, simple oxygen devices Brush patient's teeth BID with toothbrush (using toothpaste with fluoride): Yes Patient is AT RISK - Oral Care Protocol followed (see row info): Yes   Dysphagia Treatment Treatment focused on: Skilled observation of diet  tolerance;Patient/family/caregiver Dealer Educated: daughters Treatment Methods/Modalities: Skilled observation Patient observed directly with PO's: Yes Type of PO's observed: Thin liquids;Dysphagia 3 (soft) Feeding: Able to feed self;Needs assist;Needs set up Liquids provided via: Cup;No straw Oral Phase Signs & Symptoms: Prolonged bolus formation;Prolonged mastication Pharyngeal Phase Signs & Symptoms: Immediate throat clear;Delayed throat clear Type of cueing: Verbal;Visual Amount of cueing: Minimal   GO     Royce Macadamia M.Ed [employer] [phone]  07/03/2012

## 2012-07-03 NOTE — Progress Notes (Signed)
Stroke Team Progress Note  HISTORY Rachel Cooke is an 79 y.o. female with known hypertension, dyslipidemia, status post permanent pacemaker placement who was brought to ED on 06-28-12 due to AMS. In the chart primary team ahd spoken with family and most of this history is obtained from the patient's daughter who was at bedside. "Per the history obtained, this morning patient was found on the floor and confused at her independent living facility. She was then brought here to the emergency room, where a CT of the head and neck were done which were negative for any significant abnormalities. A urinalysis was suggestive of a UTI. She was given Rocephin, and family has noted some improvement in her mentation-while she has been here. Daughter at bedside does indicate that the family does not desire any heroic measures, they do however desire IV antibiotics and treatment of potentially reversible conditions. At this point, the family clearly states that did not wish to pursue any aggressive intervention like cardiac cath or stress testing."   While in the hospital it was noted she was not moving her right arm as well as usual but unclear if it was her AMS or possible CVA. After her mentation started to clear it became obvious she was not moving her right side as well. repeat CT head showed a subtle hypodensity in the left parietal corona radiata/centrum semiovale appears more conspicuous than previous scan. Neurology was asked to see patient.   Patient was not a TPA candidate secondary to stroke not considered/recognized on admission. She was admitted for further evaluation and treatment.  SUBJECTIVE Her son and son-in-law are at the bedside.  Overall her son-in-law feels her condition is gradually improving. Lives in independent living, [facility]. Walks with a walker. Does on ADLs. Walks to dining room.   OBJECTIVE Most recent Vital Signs: Filed Vitals:   07/02/12 1409 07/02/12 2037 07/03/12 0500  07/03/12 1424  BP: 140/63 153/68 148/62 160/65  Pulse: 87 95 94 87  Temp: 98 F (36.7 C) 98.5 F (36.9 C) 98.2 F (36.8 C) 98.3 F (36.8 C)  TempSrc: Oral Oral Oral Oral  Resp: 20 20 20 20   Height:      Weight:   84.5 kg (186 lb 4.6 oz)   SpO2: 98% 95% 95% 94%   CBG (last 3)  No results found for this basename: GLUCAP,  in the last 72 hours IV Fluid Intake:     MEDICATIONS  . cefTRIAXone (ROCEPHIN)  IV  1 g Intravenous Q24H  . clopidogrel  75 mg Oral Q breakfast  . enoxaparin (LOVENOX) injection  40 mg Subcutaneous Q24H  . escitalopram  20 mg Oral Daily  . feeding supplement  1 Container Oral TID BM  . levothyroxine  75 mcg Oral QAC breakfast  . simvastatin  5 mg Oral q1800   PRN:  acetaminophen, acetaminophen, albuterol, alum & mag hydroxide-simeth, bisacodyl, food thickener  Diet:  Dysphagia 1 nectar thick liquids Activity:   Up with assistance DVT Prophylaxis:  Lovenox 40 mg sq daily   CLINICALLY SIGNIFICANT STUDIES Basic Metabolic Panel:   Recent Labs Lab 06/30/12 0920 07/01/12 0725  NA 141 143  K 3.9 3.8  CL 104 105  CO2 28 26  GLUCOSE 148* 94  BUN 10 9  CREATININE 0.60 0.55  CALCIUM 9.1 9.2   Liver Function Tests: No results found for this basename: AST, ALT, ALKPHOS, BILITOT, PROT, ALBUMIN,  in the last 168 hours CBC:  Recent Labs Lab 06/28/12 1252  06/30/12 0920 07/01/12 0725  WBC 10.0  < > 7.6 7.0  NEUTROABS 8.0*  --   --   --   HGB 13.7  < > 13.5 13.3  HCT 39.4  < > 38.8 39.3  MCV 89.5  < > 91.5 92.5  PLT 150  < > 140* 144*  < > = values in this interval not displayed.  Coagulation:   Recent Labs Lab 06/29/12 0624  LABPROT 14.3  INR 1.13   Cardiac Enzymes:   Recent Labs Lab 06/28/12 1640 06/28/12 2200 06/29/12 0624  TROPONINI 0.75* 1.22* 0.77*   Urinalysis:   Recent Labs Lab 06/28/12 1320  COLORURINE YELLOW  LABSPEC 1.020  PHURINE 5.0  GLUCOSEU NEGATIVE  HGBUR SMALL*  BILIRUBINUR NEGATIVE  KETONESUR NEGATIVE   PROTEINUR NEGATIVE  UROBILINOGEN 0.2  NITRITE POSITIVE*  LEUKOCYTESUR SMALL*   Lipid Panel    Component Value Date/Time   CHOL 174 07/01/2012 0725   TRIG 167* 07/01/2012 0725   HDL 42 07/01/2012 0725   CHOLHDL 4.1 07/01/2012 0725   VLDL 33 07/01/2012 0725   LDLCALC 99 07/01/2012 0725   HgbA1C  Lab Results  Component Value Date   HGBA1C 6.2* 07/01/2012    Urine Drug Screen:   No results found for this basename: labopia,  cocainscrnur,  labbenz,  amphetmu,  thcu,  labbarb    Alcohol Level: No results found for this basename: ETH,  in the last 168 hours  CT of the brain   06/29/2012   1.  Subtle hypodensity in the left parietal corona radiata/centrum semiovale appears more conspicuous than on yesterday's exam.  This could be an indicator of a small white matter infarct, inflammatory focus, or injury.  No hemorrhage identified.   06/28/2012  No acute abnormality.    CT Cervical Spine 06/28/2012  Mild degenerative change.  Negative for fracture.     MRI/A of the brain  pacer  2D Echocardiogram  EF 60-65% with no source of embolus.   Carotid Doppler  No evidence of ICA stenosis bilaterally  CXR   06/30/2012   Stable. No acute cardiopulmonary abnormality. 06/28/2012 Cardiomegaly without evidence of acute cardiopulmonary disease.    EKG  normal sinus rhythm.   Therapy Recommendations CIR  Physical Exam   Frail elderly [pleasant Caucasian lady not in distress.Awake alert. Afebrile. Head is nontraumatic. Neck is supple without bruit. Hearing is normal. Cardiac exam no murmur or gallop. Lungs are clear to auscultation. Distal pulses are well felt. Neurological Exam: Awake alert oriented x3. Diminished attention, registration and recall. Speech is slightly nonfluent but no aphasia, dysarthria or apraxia. Follows two-step commands well. Extraocular movements are full range without nystagmus. Fundi were not visualized. Vision acuity seems adequate. Visual fields appear full. Face is  symmetric. Tongue is midline. Palatal movements are normal. Motor system exam reveals dense right hemiplegia with 0/5 strength on the right with diminished tone. Normal antigravity strength on the left side. Right plantar is upgoing. Left is downgoing. Gait was not tested. Diminished sensation on the right.   ASSESSMENT Ms. Rachel Cooke is a 79 y.o. female presenting with acute fall, confusion in a lady who was previously mentally intact and independent with ADLs.  Initial imaging unable to confirm an acute stroke. Unable to have MRI due to pacemaker. Repeat CT demonstrates a left parietal corona radiata/centrum semiovale infarct. Infarct etiology likley small vessel disease. On aspirin 81 mg orally every day prior to admission. Now on aspirin 81 mg orally every day and  clopidogrel 75 mg orally every day for secondary stroke prevention. Patient with resultant lethargy, right hemiparesis, expressive aphasia, dysphagia. Work up underway.   Hypertension  UTI, ecoli, on abx  Hyperlipidemia, LDL 99, not on statin PTA, at goal LDL < 100  LDL 99  Diabetes, HgbA1c 6.2  Hospital day # 5  TREATMENT/PLAN  Continue with aspirin orally every day for secondary stroke prevention  Returning to [facility].  Risk factor modification  Follow-up with Dr. Pearlean Brownie in 2 months.  Gwendolyn Lima. Manson Passey, [facility], MBA, MHA [facility] Pager: [phone] 07/03/2012 2:44 PM  I have personally obtained a history, examined the patient, evaluated imaging results, and formulated the assessment and plan of care. I agree with the above.  Delia Heady, MD

## 2012-07-03 NOTE — Discharge Summary (Signed)
Physician Discharge Summary  Rachel Cooke ZOX:[id number] DOB: 1919-08-08 DOA: 06/28/2012  PCP: Kimber Relic, MD  Admit date: 06/28/2012 Discharge date: 07/03/2012  Time spent: > 35 minutes  Recommendations for Outpatient Follow-up:  1. Patient will need physical therapy and occupational therapy while at SNF. 2. Also will need routine follow up with Dr. Pearlean Brownie with neurology. Patient and or patient representative to call his office for follow up. 3. Blood pressure medication held due to permissive htn given history of recent stroke. Will need to have these medications resumed as outpatient.  Discharge Diagnoses:  Principal Problem:   CVA (cerebral infarction) Active Problems:   Metabolic encephalopathy   UTI (lower urinary tract infection)   NSTEMI (non-ST elevated myocardial infarction)   HTN (hypertension)   Dyslipidemia   Hypothyroidism   Breast cancer   Discharge Condition: Stable   Diet recommendation: Dysphagia 2 with low sodium heart healthy  Filed Weights   07/01/12 0532 07/02/12 0534 07/03/12 0500  Weight: 84.5 kg (186 lb 4.6 oz) 84.3 kg (185 lb 13.6 oz) 84.5 kg (186 lb 4.6 oz)    History of present illness:  79 y/o with PMH of HTN, HPL, s/p permanent pacemaker who presented initially to hospital with metabolic encephalopathy.  Hospital Course:  Acute deep brain Cerebrovascular Accident - present on admission - with right hemiparesis, no aphasia, moderate dysphagia. She does have a pacemaker so we were not be able to perform an MRI. Placed on aspirin PR initially  Neurology recommends Plavix 75 mg daily. Zocor 5 mg QHS started 4/29  Neurology consultation obtained 4/29 patient to follow up with neurologist  Toxic Metabolic Encephalopathy persistent after admission resolved by 4/29. There is no reported seizure activity. Most likely related to UTI of which patient received appropriate course of antibiotic treatment with rocephin.  Ecoli UTI - initially we assumed  that she was not responding to rocephin. She was changed to zosyn on 4/27, but The final urine culture was positive for Escherichia coli which is essentially pan sensitive. Patient resumed Rocephin on April 28.  Patient had 5 days total treatment with antibiotics. Afebrile and WBC within normal limits on day of discharge.  NSTEMI -type 2 MI - patient was placed initially on BB iv aspirin PR because she was unable to swallow.. Transthoracic echocardiogram with preserved ejection fraction and no wall motion abnormalities. Once a diagnosis of stroke was confirmed we did stop BB as to allow for some permissive hypertension.  Will hold antihypertensive medications until pcp follow up.  S/p pacer  Hypothyroidism - On synthroid.   Dysphagia-Due to stroke most likely. D/c on dysphagia 2 diet.  Urinary retention probably from UTI and constipation - foley placed 4/29.   Depression - resumed Lexapro    Procedures:  dopper  2 D echocardiogram  Consultations:  Neurology: Dr. Pearlean Brownie  Discharge Exam: Filed Vitals:   07/02/12 1045 07/02/12 1409 07/02/12 2037 07/03/12 0500  BP: 152/68 140/63 153/68 148/62  Pulse: 80 87 95 94  Temp: 98.4 F (36.9 C) 98 F (36.7 C) 98.5 F (36.9 C) 98.2 F (36.8 C)  TempSrc: Oral Oral Oral Oral  Resp: 20 20 20 20   Height:      Weight:    84.5 kg (186 lb 4.6 oz)  SpO2: 96% 98% 95% 95%    General: Pt in NAD, Alert and Awake Cardiovascular: S1 and S2 WNL, no murmurs Respiratory: CTA BL, no wheezes Neuro: Right hemiplegia  Discharge Instructions  Discharge Orders   Future  Appointments Provider Department Dept Phone   07/30/2012 11:30 AM Claudette Royston Sinner, NP [facility] [phone]   Future Orders Complete By Expires     Call MD for:  extreme fatigue  As directed     Call MD for:  temperature >100.4  As directed     Diet - low sodium heart healthy  As directed     Increase activity slowly  As directed         Medication List    STOP  taking these medications       aspirin EC 81 MG tablet     fenofibrate 145 MG tablet  Commonly known as:  TRICOR     furosemide 40 MG tablet  Commonly known as:  LASIX     losartan 50 MG tablet  Commonly known as:  COZAAR     metoprolol succinate 100 MG 24 hr tablet  Commonly known as:  TOPROL-XL     potassium chloride 10 MEQ tablet  Commonly known as:  K-DUR      TAKE these medications       acetaminophen 650 MG CR tablet  Commonly known as:  TYLENOL  Take 650 mg by mouth 2 (two) times daily.     beta carotene w/minerals tablet  Take 2 tablets by mouth daily.     CALCIUM 600 + D PO  Take 1 tablet by mouth 2 (two) times daily.     cetirizine 10 MG tablet  Commonly known as:  ZYRTEC  Take 10 mg by mouth daily as needed for allergies.     clopidogrel 75 MG tablet  Commonly known as:  PLAVIX  Take 1 tablet (75 mg total) by mouth daily with breakfast.     escitalopram 20 MG tablet  Commonly known as:  LEXAPRO  Take 20 mg by mouth daily.     feeding supplement Pudg  Take 1 Container by mouth 3 (three) times daily between meals.     FISH OIL PO  Take 1 capsule by mouth daily.     levothyroxine 75 MCG tablet  Commonly known as:  SYNTHROID, LEVOTHROID  Take 75 mcg by mouth daily before breakfast.     multivitamin with minerals Tabs  Take 1 tablet by mouth daily.     niacin 500 MG CR capsule  Take 1,000 mg by mouth at bedtime.     simvastatin 5 MG tablet  Commonly known as:  ZOCOR  Take 1 tablet (5 mg total) by mouth daily at 6 PM.     Vitamin D (Ergocalciferol) 50000 UNITS Caps  Commonly known as:  DRISDOL  Take 50,000 Units by mouth every 7 (seven) days.          The results of significant diagnostics from this hospitalization (including imaging, microbiology, ancillary and laboratory) are listed below for reference.    Significant Diagnostic Studies: Dg Chest 2 View  06/28/2012  *RADIOLOGY REPORT*  Clinical Data: Fall with altered mental status.   CHEST - 2 VIEW  Comparison: 07/05/2009 chest radiograph  Findings: Cardiomegaly and left-sided pacemaker again noted. Left basilar scarring is again identified. There is no evidence of focal airspace disease, pulmonary edema, suspicious pulmonary nodule/mass, pleural effusion, or pneumothorax. No acute bony abnormalities are identified. A thoracic kyphosis is again visualized.  IMPRESSION: Cardiomegaly without evidence of acute cardiopulmonary disease.   Original Report Authenticated By: Harmon Pier, M.D.    Ct Head Wo Contrast  06/29/2012  *RADIOLOGY REPORT*  Clinical Data: Fall.  Right-sided weakness.  Unresponsive patient.  CT HEAD WITHOUT CONTRAST  Technique:  Contiguous axial images were obtained from the base of the skull through the vertex without contrast.  Comparison: 06/28/2012  Findings: The brain stem, cerebellum, cerebral peduncles, thalami, basal ganglia, basilar cisterns, and ventricular system appear unremarkable.  Subtly increased hypodensity in the left parietal corona radiata noted.  No significant loss of gray-white differentiation or other discrete indicators of stroke.  No intracranial hemorrhage or obvious mass lesion.  No midline shift.  Atherosclerotic calcification of the carotid siphons noted.  IMPRESSION:  1.  Subtle hypodensity in the left parietal corona radiata/centrum semiovale appears more conspicuous than on yesterday's exam.  This could be an indicator of a small white matter infarct, inflammatory focus, or injury.  No hemorrhage identified.  MRI may help for further characterization if clinically warranted.   Original Report Authenticated By: Gaylyn Rong, M.D.    Ct Head Wo Contrast  06/28/2012  *RADIOLOGY REPORT*  Clinical Data:  Fall.  Altered mental status  CT HEAD WITHOUT CONTRAST CT CERVICAL SPINE WITHOUT CONTRAST  Technique:  Multidetector CT imaging of the head and cervical spine was performed following the standard protocol without intravenous contrast.   Multiplanar CT image reconstructions of the cervical spine were also generated.  Comparison:  CT 03/21/2005  CT HEAD  Findings:  Age appropriate mild atrophy.  Normal brain volume for age.  Minimal chronic microvascular ischemia in the white matter. No acute infarct.  Negative for hemorrhage mass or skull fracture.  IMPRESSION: No acute abnormality.  CT CERVICAL SPINE  Findings: Thoracic scoliosis.  Normal sagittal alignment.  Mild disc degeneration.  There is facet degeneration, left greater than right at C4-5 and C5-6.  Negative for fracture.  IMPRESSION: Mild degenerative change.  Negative for fracture.   Original Report Authenticated By: Janeece Riggers, M.D.    Ct Cervical Spine Wo Contrast  06/28/2012  *RADIOLOGY REPORT*  Clinical Data:  Fall.  Altered mental status  CT HEAD WITHOUT CONTRAST CT CERVICAL SPINE WITHOUT CONTRAST  Technique:  Multidetector CT imaging of the head and cervical spine was performed following the standard protocol without intravenous contrast.  Multiplanar CT image reconstructions of the cervical spine were also generated.  Comparison:  CT 03/21/2005  CT HEAD  Findings:  Age appropriate mild atrophy.  Normal brain volume for age.  Minimal chronic microvascular ischemia in the white matter. No acute infarct.  Negative for hemorrhage mass or skull fracture.  IMPRESSION: No acute abnormality.  CT CERVICAL SPINE  Findings: Thoracic scoliosis.  Normal sagittal alignment.  Mild disc degeneration.  There is facet degeneration, left greater than right at C4-5 and C5-6.  Negative for fracture.  IMPRESSION: Mild degenerative change.  Negative for fracture.   Original Report Authenticated By: Janeece Riggers, M.D.    Dg Chest Port 1 View  06/30/2012  *RADIOLOGY REPORT*  Clinical Data: 79 year old female right side weakness and cough. Possible aspiration.  PORTABLE CHEST - 1 VIEW  Comparison: 06/28/2012 and earlier.  Findings: Portable upright AP view at 1218 hours.  The patient is slightly more  rotated to the left.  Stable lung volumes.  Stable cardiac size and mediastinal contours.  Left chest cardiac AICD again noted.  Stable scarring or atelectasis at the left lung base with no acute or confluent pulmonary opacity to suggest aspiration. No pneumothorax, edema, or definite effusion.  IMPRESSION: Stable. No acute cardiopulmonary abnormality.   Original Report Authenticated By: Erskine Speed, M.D.     Microbiology:  Recent Results (from the past 240 hour(s))  URINE CULTURE     Status: None   Collection Time    06/28/12  1:20 PM      Result Value Range Status   Specimen Description URINE, CATHETERIZED   Final   Special Requests NONE   Final   Culture  Setup Time 06/28/2012 18:16   Final   Colony Count >=100,000 COLONIES/ML   Final   Culture ESCHERICHIA COLI   Final   Report Status 06/30/2012 FINAL   Final   Organism ID, Bacteria ESCHERICHIA COLI   Final     Labs: Basic Metabolic Panel:  Recent Labs Lab 06/28/12 1252 06/29/12 0624 06/30/12 0920 07/01/12 0725  NA 140 141 141 143  K 3.8 3.9 3.9 3.8  CL 103 105 104 105  CO2 26 26 28 26   GLUCOSE 149* 134* 148* 94  BUN 11 11 10 9   CREATININE 0.59 0.55 0.60 0.55  CALCIUM 9.3 9.0 9.1 9.2   Liver Function Tests: No results found for this basename: AST, ALT, ALKPHOS, BILITOT, PROT, ALBUMIN,  in the last 168 hours No results found for this basename: LIPASE, AMYLASE,  in the last 168 hours No results found for this basename: AMMONIA,  in the last 168 hours CBC:  Recent Labs Lab 06/28/12 1252 06/29/12 0624 06/30/12 0920 07/01/12 0725  WBC 10.0 7.9 7.6 7.0  NEUTROABS 8.0*  --   --   --   HGB 13.7 12.5 13.5 13.3  HCT 39.4 36.4 38.8 39.3  MCV 89.5 90.1 91.5 92.5  PLT 150 153 140* 144*   Cardiac Enzymes:  Recent Labs Lab 06/28/12 1532 06/28/12 1640 06/28/12 2200 06/29/12 0624  TROPONINI 1.91* 0.75* 1.22* 0.77*   BNP: BNP (last 3 results) No results found for this basename: PROBNP,  in the last 8760  hours CBG:  Recent Labs Lab 06/29/12 0947 06/30/12 1357  GLUCAP 125* 94       Signed:  Penny Pia  [facility] 07/03/2012, 10:40 AM

## 2012-07-03 NOTE — Progress Notes (Signed)
Pt d/c' to [facility]  Via ambulance.  Bettie Capistran,RN.

## 2012-07-03 NOTE — Progress Notes (Signed)
Attempted to call report to Alden, nurse at [facility].  Instructed to call her back in 10 minutes.  Amanda Pea, Charity fundraiser.

## 2012-07-03 NOTE — Progress Notes (Signed)
Brief Nutrition Note:  RD notified by RN that pt having questions about lactose free diet. Ensure Complete, Boost, Boost Breeze, and Ensure pudding are lactose free supplements.  Magic Cup is not a lactose free product. RD will make sure this item is removed from pt meal orders.   Clarene Duke RD, LDN Pager [phone] After Hours pager [phone]

## 2012-07-04 ENCOUNTER — Non-Acute Institutional Stay (SKILLED_NURSING_FACILITY): Payer: Medicare Other | Admitting: Geriatric Medicine

## 2012-07-04 ENCOUNTER — Encounter: Payer: Self-pay | Admitting: Geriatric Medicine

## 2012-07-04 DIAGNOSIS — M1990 Unspecified osteoarthritis, unspecified site: Secondary | ICD-10-CM

## 2012-07-04 DIAGNOSIS — I10 Essential (primary) hypertension: Secondary | ICD-10-CM | POA: Diagnosis not present

## 2012-07-04 DIAGNOSIS — H547 Unspecified visual loss: Secondary | ICD-10-CM | POA: Diagnosis not present

## 2012-07-04 DIAGNOSIS — E119 Type 2 diabetes mellitus without complications: Secondary | ICD-10-CM

## 2012-07-04 DIAGNOSIS — E039 Hypothyroidism, unspecified: Secondary | ICD-10-CM | POA: Diagnosis not present

## 2012-07-04 DIAGNOSIS — I442 Atrioventricular block, complete: Secondary | ICD-10-CM

## 2012-07-04 DIAGNOSIS — I69991 Dysphagia following unspecified cerebrovascular disease: Secondary | ICD-10-CM

## 2012-07-04 DIAGNOSIS — I214 Non-ST elevation (NSTEMI) myocardial infarction: Secondary | ICD-10-CM | POA: Diagnosis not present

## 2012-07-04 DIAGNOSIS — I6350 Cerebral infarction due to unspecified occlusion or stenosis of unspecified cerebral artery: Secondary | ICD-10-CM

## 2012-07-04 DIAGNOSIS — R1312 Dysphagia, oropharyngeal phase: Secondary | ICD-10-CM | POA: Diagnosis not present

## 2012-07-04 DIAGNOSIS — I639 Cerebral infarction, unspecified: Secondary | ICD-10-CM

## 2012-07-04 DIAGNOSIS — G8100 Flaccid hemiplegia affecting unspecified side: Secondary | ICD-10-CM | POA: Diagnosis not present

## 2012-07-04 DIAGNOSIS — R197 Diarrhea, unspecified: Secondary | ICD-10-CM

## 2012-07-04 DIAGNOSIS — E781 Pure hyperglyceridemia: Secondary | ICD-10-CM | POA: Diagnosis not present

## 2012-07-04 DIAGNOSIS — I69959 Hemiplegia and hemiparesis following unspecified cerebrovascular disease affecting unspecified side: Secondary | ICD-10-CM | POA: Diagnosis not present

## 2012-07-04 DIAGNOSIS — I69919 Unspecified symptoms and signs involving cognitive functions following unspecified cerebrovascular disease: Secondary | ICD-10-CM | POA: Diagnosis not present

## 2012-07-04 DIAGNOSIS — D649 Anemia, unspecified: Secondary | ICD-10-CM | POA: Diagnosis not present

## 2012-07-04 DIAGNOSIS — N390 Urinary tract infection, site not specified: Secondary | ICD-10-CM

## 2012-07-04 DIAGNOSIS — F411 Generalized anxiety disorder: Secondary | ICD-10-CM

## 2012-07-04 DIAGNOSIS — I69391 Dysphagia following cerebral infarction: Secondary | ICD-10-CM

## 2012-07-04 DIAGNOSIS — R269 Unspecified abnormalities of gait and mobility: Secondary | ICD-10-CM | POA: Diagnosis not present

## 2012-07-04 DIAGNOSIS — M6281 Muscle weakness (generalized): Secondary | ICD-10-CM | POA: Diagnosis not present

## 2012-07-04 DIAGNOSIS — R279 Unspecified lack of coordination: Secondary | ICD-10-CM | POA: Diagnosis not present

## 2012-07-04 DIAGNOSIS — R339 Retention of urine, unspecified: Secondary | ICD-10-CM

## 2012-07-04 NOTE — Progress Notes (Signed)
Patient ID: Rachel Cooke, female   DOB: Jul 26, 1919, 79 y.o.   MRN: [id number] Chief Complaint  Patient presents with  . Hospitalization Follow-up    CVA, MI, UTI    HPI: This 79 year old female resident of [facility] section was sent to hospital on 06/28/2012 due to altered mental status. Evaluation revealed deep brain CVA;  patient has resultant right hemiparesis, no aphasia but does have moderate dysphagia. This episode is further complicated by myocardial infarction, UTI. Patient was evaluated by Dr. Pearlean Brownie from neurology, he recommends Plavix daily for further stroke risk reduction. Cardiology was also consulted due to the patient's MI she was initially started on beta blocker however this was stopped light of her stroke to allow for soome permissive hypertension.  Patient's UTI was treated with Rocephin, total of 5 days. Patient did experience urinary retention during hospitalization, Foley catheter was placed April 29, this was discontinued on hospital discharge. Patient was medically ready for discharge to skilled nursing rehabilitation at [facility] on 07/03/2012. Required bladder catheterization last night yeilded 250 cc urine.  Allergies No Known Allergies   Past Medical History  Diagnosis Date  . Cancer   . Thyroid disease   . Hypertension   . High cholesterol   . Dendritic keratitis 2012  . Malignant neoplasm of breast (female), unspecified site 1970    S/P Rt radical mastectomy  . Unspecified hypothyroidism 2009  . Type II or unspecified type diabetes mellitus without mention of complication, not stated as uncontrolled 2003  . Unspecified vitamin D deficiency 2009  . Pure hyperglyceridemia 2004  . Anxiety state, unspecified   . Macular degeneration (senile) of retina, unspecified 2012  . Unspecified glaucoma(365.9) 2013  . Tinnitus 2007  . Unspecified essential hypertension 2000  . Coronary atherosclerosis of native coronary artery  2003    non obstructive by Cath 2003  . Atrioventricular block, complete 2003    s/p PPM 2003, generator chnage 2011  . Allergic rhinitis due to pollen   . Acquired cyst of kidney 2002  . Osteoarthrosis, unspecified whether generalized or localized, unspecified site 2013  . Unspecified arthropathy, pelvic region and thigh 2010  . Senile osteoporosis 2003  . Abnormality of gait 2007  . Edema 2003  . Personal history of colonic polyps      s/p colonoscopy/polypectomy 2003  . Cardiac pacemaker in situ 2003  . CVA (cerebral infarction) 06/28/2012    rt hemiparesis, dysphagia  . NSTEMI (non-ST elevated myocardial infarction) 06/28/2012   Past Surgical History  Procedure Laterality Date  . Mastectomy Right 1970    Cancer   Social History:   reports that she has never smoked. She does not have any smokeless tobacco history on file. She reports that she does not drink alcohol. Her drug history is not on file.  No family history on file.  Medications: Reviewed   Physical Exam:  Filed Vitals:   07/04/12 1655  BP: 145/80  Pulse: 81  Temp: 96.5 F (35.8 C)  TempSrc: Oral  Resp: 15  Weight: 183 lb 6.4 oz (83.19 kg)  SpO2: 94%  Body mass index is 29.62 kg/(m^2).  PHYSICAL EXAM:   GENERAL APPEARANCE: No acute distress, appropriately groomed, obese body habitus. Awake, responsive, minimal conversation HEAD: Normocephalic, atraumatic mild right facial droop EYES: Conjunctiva/lids clear. Pupils round, reactive.   EARS: External exam WNL, . Hearing grossly normal. NOSE: No deformity or discharge. MOUTH/THROAT: Lips w/o lesions. Oral mucosa, tongue moist, w/o lesion. Oropharynx w/o redness or  lesions.  NECK: Supple, full ROM. No thyroid tenderness, enlargement or nodule LYMPHATICS: No head, neck or supraclavicular adenopathy RESPIRATORY: Breathing is even, unlabored. Lungs a few crackles at bases CARDIOVASCULAR: Heart RRR. No murmur or extra heart sounds  ARTERIAL: No carotid bruit.  2+ pulse  DP   EDEMA: No peripheral or periorbital edema.  GASTROINTESTINAL: Abdomen is soft, non-tender, Obese, increased BS, incontinent loose stool, incontinent rash present on buttocks and upper inner thighs GENITOURINARY: Bladder non tender, not distended. No U/O today  NEUROLOGIC: Not oriented to time, place, recognizes familiar people, called me by name. Flaccid right arm and hand, with 1+ edema. Moves toes otherwise flaccid right leg. Speech clear, very soft no tremor.  PSYCHIATRIC: Mood and affect appropriate to situation   Recent Labs  06/29/12 0624 06/30/12 0920 07/01/12 0725  NA 141 141 143  K 3.9 3.9 3.8  CL 105 104 105  CO2 26 28 26   GLUCOSE 134* 148* 94  BUN 11 10 9   CREATININE 0.55 0.60 0.55  CALCIUM 9.0 9.1 9.2   CBC:  Recent Labs  06/28/12 1252 06/29/12 0624 06/30/12 0920 07/01/12 0725  WBC 10.0 7.9 7.6 7.0  NEUTROABS 8.0*  --   --   --   HGB 13.7 12.5 13.5 13.3  HCT 39.4 36.4 38.8 39.3  MCV 89.5 90.1 91.5 92.5  PLT 150 153 140* 144*   Cardiac Enzymes:  Recent Labs  06/28/12 1640 06/28/12 2200 06/29/12 0624  TROPONINI 0.75* 1.22* 0.77*   CBG:  Recent Labs  06/29/12 0947 06/30/12 1357  GLUCAP 125* 94    Radiological Exams: 06/28/12: CT cervical spine, CT head, chest x-ray 06/29/12: CT head without contrast   ASSESSMENT/PLAN  NSTEMI (non-ST elevated myocardial infarction) Elevated troponin values on hospital admission. Transthoracic transthoracic echocardiogram showed preserved ejection fraction no wall motion abnormalities. Antihypertensive medicines are on hold to allow for some permissive hypertension related to recent CVA.  Type II or unspecified type diabetes mellitus without mention of complication, not stated as uncontrolled No diabetes medicines during hospitalization, patient's CBGs were controlled. Will check fasting CBG each a.m., resume medication if required.  CVA (cerebral infarction) 3 CVA 06/28/2012 with resultant right  patient will need physical, occupational and speech therapy to regain maximal functional status. Patient is currently very debilitated and does require skilled nursing care to his discharge summary indicated outpatient follow up with Dr. Pearlean Brownie in the future.  Urinary retention Urinary retention requiring catheterization during hospitalization. Patient has been treated for UTI. Very little urine output since admission and rehabilitation section, we'll replace Foley catheter for better duration of urinary output and management.  UTI (lower urinary tract infection) UTI with Escherichia coli was noted on hospital admission. Patient was treated with 5 days of Rocephin. Continue to monitor for any signs of recurrent infection.  Diarrhea Multiple incontinent loose bowel movements since admission to rehabilitation. No abdominal pain, fever recent WBC within normal limits. This is possibly food or medication related. We'll stop Ensure for now, simplify medication list  Dysphagia due to recent stroke Poor swallowing ability since the stroke paternal ST evaluation recommends dysphagic to diet. Patient will be evaluated by facility speech therapist for appropriate diet consistency as well as repeat it improves at all. We'll simplify medication list until her swallowing improves.  LAB: 07/10/12 CBC, CMP. TSH  Follow up: As needed  Cecil Vandyke T.Kline Bulthuis, NP-C 07/04/2012

## 2012-07-04 NOTE — Progress Notes (Signed)
Rachel Cooke, PTA [phone] 07/04/2012  

## 2012-07-07 ENCOUNTER — Encounter: Payer: Self-pay | Admitting: Geriatric Medicine

## 2012-07-07 ENCOUNTER — Non-Acute Institutional Stay (SKILLED_NURSING_FACILITY): Payer: Medicare Other | Admitting: Internal Medicine

## 2012-07-07 DIAGNOSIS — E039 Hypothyroidism, unspecified: Secondary | ICD-10-CM | POA: Diagnosis not present

## 2012-07-07 DIAGNOSIS — E119 Type 2 diabetes mellitus without complications: Secondary | ICD-10-CM

## 2012-07-07 DIAGNOSIS — R34 Anuria and oliguria: Secondary | ICD-10-CM | POA: Diagnosis not present

## 2012-07-07 DIAGNOSIS — I10 Essential (primary) hypertension: Secondary | ICD-10-CM

## 2012-07-07 DIAGNOSIS — R339 Retention of urine, unspecified: Secondary | ICD-10-CM

## 2012-07-07 DIAGNOSIS — I69991 Dysphagia following unspecified cerebrovascular disease: Secondary | ICD-10-CM | POA: Diagnosis not present

## 2012-07-07 DIAGNOSIS — I69919 Unspecified symptoms and signs involving cognitive functions following unspecified cerebrovascular disease: Secondary | ICD-10-CM | POA: Diagnosis not present

## 2012-07-07 DIAGNOSIS — F411 Generalized anxiety disorder: Secondary | ICD-10-CM

## 2012-07-07 DIAGNOSIS — H547 Unspecified visual loss: Secondary | ICD-10-CM | POA: Diagnosis not present

## 2012-07-07 DIAGNOSIS — N390 Urinary tract infection, site not specified: Secondary | ICD-10-CM

## 2012-07-07 DIAGNOSIS — I214 Non-ST elevation (NSTEMI) myocardial infarction: Secondary | ICD-10-CM | POA: Diagnosis not present

## 2012-07-07 DIAGNOSIS — R197 Diarrhea, unspecified: Secondary | ICD-10-CM | POA: Insufficient documentation

## 2012-07-07 DIAGNOSIS — I69391 Dysphagia following cerebral infarction: Secondary | ICD-10-CM

## 2012-07-07 DIAGNOSIS — G8100 Flaccid hemiplegia affecting unspecified side: Secondary | ICD-10-CM | POA: Diagnosis not present

## 2012-07-07 DIAGNOSIS — M6281 Muscle weakness (generalized): Secondary | ICD-10-CM | POA: Diagnosis not present

## 2012-07-07 DIAGNOSIS — E876 Hypokalemia: Secondary | ICD-10-CM | POA: Insufficient documentation

## 2012-07-07 DIAGNOSIS — C50919 Malignant neoplasm of unspecified site of unspecified female breast: Secondary | ICD-10-CM

## 2012-07-07 DIAGNOSIS — E781 Pure hyperglyceridemia: Secondary | ICD-10-CM | POA: Insufficient documentation

## 2012-07-07 DIAGNOSIS — I6350 Cerebral infarction due to unspecified occlusion or stenosis of unspecified cerebral artery: Secondary | ICD-10-CM

## 2012-07-07 DIAGNOSIS — R609 Edema, unspecified: Secondary | ICD-10-CM | POA: Diagnosis not present

## 2012-07-07 DIAGNOSIS — I442 Atrioventricular block, complete: Secondary | ICD-10-CM | POA: Insufficient documentation

## 2012-07-07 DIAGNOSIS — E118 Type 2 diabetes mellitus with unspecified complications: Secondary | ICD-10-CM | POA: Insufficient documentation

## 2012-07-07 DIAGNOSIS — M1990 Unspecified osteoarthritis, unspecified site: Secondary | ICD-10-CM | POA: Insufficient documentation

## 2012-07-07 DIAGNOSIS — I639 Cerebral infarction, unspecified: Secondary | ICD-10-CM

## 2012-07-07 DIAGNOSIS — I69959 Hemiplegia and hemiparesis following unspecified cerebrovascular disease affecting unspecified side: Secondary | ICD-10-CM | POA: Diagnosis not present

## 2012-07-07 DIAGNOSIS — R269 Unspecified abnormalities of gait and mobility: Secondary | ICD-10-CM | POA: Diagnosis not present

## 2012-07-07 HISTORY — DX: Retention of urine, unspecified: R33.9

## 2012-07-07 MED ORDER — POTASSIUM CHLORIDE CRYS ER 20 MEQ PO TBCR: EXTENDED_RELEASE_TABLET | ORAL | Status: DC

## 2012-07-07 NOTE — Assessment & Plan Note (Signed)
No diabetes medicines during hospitalization, patient's CBGs were controlled. Will check fasting CBG each a.m., resume medication if required.

## 2012-07-07 NOTE — Assessment & Plan Note (Signed)
Poor swallowing ability since the stroke paternal ST evaluation recommends dysphagic to diet. Patient will be evaluated by facility speech therapist for appropriate diet consistency as well as repeat it improves at all. We'll simplify medication list until her swallowing improves.

## 2012-07-07 NOTE — Assessment & Plan Note (Signed)
Urinary retention requiring catheterization during hospitalization. Patient has been treated for UTI. Very little urine output since admission and rehabilitation section, we'll replace Foley catheter for better duration of urinary output and management.

## 2012-07-07 NOTE — Progress Notes (Signed)
Patient ID: Rachel Cooke, female   DOB: [birth year], 79 y.o.   MRN: [id number] Chief complaint: New admission to skilled nursing facility following hospitalization between 06/28/12 through 07/03/12 for a new CVA   HPI:   This 79 year old female resident of [facility], Independent Living section was sent to hospital on 06/28/2012 due to altered mental status. Evaluation revealed subtle hypodensity in the left parietal corona radiata/centrum semiovale.   Patient has resultant right hemiparesis. No aphasia but does have moderate dysphagia.   This episode was further complicated by myocardial infarction and UTI with E. Coli ( treated with Rocephin, total of 5 days).   Patient was evaluated by Dr. Pearlean Brownie from neurology, who recommends Plavix daily for further stroke risk reduction.   Cardiology was also consulted due to the patient's MI she was initially started on beta blocker however this was stopped light of her stroke to allow for soome permissive hypertension.   Patient did experience urinary retention during hospitalization, Foley catheter was placed April 29, this was discontinued on hospital discharge.   Patient was medically ready for discharge to skilled nursing rehabilitation at [facility] on 07/03/2012. Required bladder catheterization following admission to SNF, which yielded 250 cc urine. Catheter remains in place at the time of this exam.  Allergies No Known Allergies   Past Medical History  Diagnosis Date  . Cancer   . Thyroid disease   . Hypertension   . High cholesterol   . Dendritic keratitis 2012  . Malignant neoplasm of breast (female), unspecified site 1970    S/P Rt radical mastectomy  . Unspecified hypothyroidism 2009  . Type II or unspecified type diabetes mellitus without mention of complication, not stated as uncontrolled 2003  . Unspecified vitamin D deficiency 2009  . Pure hyperglyceridemia 2004  . Anxiety state, unspecified   . Macular  degeneration (senile) of retina, unspecified 2012  . Unspecified glaucoma(365.9) 2013  . Tinnitus 2007  . Unspecified essential hypertension 2000  . Coronary atherosclerosis of native coronary artery 2003    non obstructive by Cath 2003  . Atrioventricular block, complete 2003    s/p PPM 2003, generator chnage 2011  . Allergic rhinitis due to pollen   . Acquired cyst of kidney 2002  . Osteoarthrosis, unspecified whether generalized or localized, unspecified site 2013  . Unspecified arthropathy, pelvic region and thigh 2010  . Senile osteoporosis 2003  . Abnormality of gait 2007  . Edema 2003  . Personal history of colonic polyps      s/p colonoscopy/polypectomy 2003  . Cardiac pacemaker in situ 2003  . CVA (cerebral infarction) 06/28/2012    rt hemiparesis, dysphagia  . NSTEMI (non-ST elevated myocardial infarction) 06/28/2012  . Urinary retention 07/07/2012   Past Surgical History  Procedure Laterality Date  . Mastectomy Right 1970    Cancer   Social History:   reports that she has never smoked. She does not have any smokeless tobacco history on file. She reports that she does not drink alcohol. Her drug history is not on file.  No family history on file.     Medication List       These changes are accurate as of: 07/07/2012  9:24 PM. If you have any questions, ask your nurse or doctor.          TAKE these medications       acetaminophen 650 MG CR tablet  Commonly known as:  TYLENOL  Take 650 mg by mouth 2 (two) times daily.  beta carotene w/minerals tablet  Take 2 tablets by mouth daily. Take 2 tablets daily for beta-carotene with minerals     cetirizine 10 MG tablet  Commonly known as:  ZYRTEC  Take 10 mg by mouth daily as needed for allergies.     clopidogrel 75 MG tablet  Commonly known as:  PLAVIX  Take 1 tablet (75 mg total) by mouth daily with breakfast.     escitalopram 20 MG tablet  Commonly known as:  LEXAPRO  Take 20 mg by mouth daily.      levothyroxine 75 MCG tablet  Commonly known as:  SYNTHROID, LEVOTHROID  Take 75 mcg by mouth daily before breakfast.     simvastatin 5 MG tablet  Commonly known as:  ZOCOR  Take 5 mg by mouth at bedtime. 1 daily to lower cholesterol     Vitamin D (Ergocalciferol) 50000 UNITS Caps  Commonly known as:  DRISDOL  Take 50,000 Units by mouth. 1 daily for vitamin D deficiency.       Review of Systems - History obtained from chart review, the patient and Family members at bedside. General ROS: positive for  - fatigue negative for - fever, hot flashes or night sweats Psychological ROS: positive for - anxiety and concentration difficulties negative for - behavioral disorder, depression, hallucinations or obsessive thoughts Ophthalmic ROS: negative ENT ROS: negative Allergy and Immunology ROS: negative Hematological and Lymphatic ROS: negative Endocrine ROS: positive for - diabetes Breast ROS: History breast cancer Respiratory ROS: no cough, shortness of breath, or wheezing Cardiovascular ROS: Recent NSTEMI. No chest pain or shortness of breath. Denies palpitations. Gastrointestinal ROS: positive for - change in bowel habits, diarrhea and stool incontinence negative for - blood in stools, melena or nausea/vomiting Genito-Urinary ROS: Recent urinary tract infection, fully treated with Rocephin. Patient denies any problems of dysuria or bladder pain. She continues to have the Foley catheter in place it was put there about 3 days ago. Musculoskeletal ROS: negative negative for - Patient denies any specific joint pains, swelling, or inflammation. Neurological ROS: positive for - bowel and bladder control changes, gait disturbance, impaired coordination/balance and memory loss negative for - dizziness, headaches or seizures Dermatological ROS: negative   Physical Exam:  Filed Vitals:   07/07/12 2105  BP: 140/70  Pulse: 71  Height: 5\' 6"  (1.676 m)  Weight: 186 lb (84.369 kg)  Body mass  index is 30.04 kg/(m^2).    GENERAL APPEARANCE: No acute distress, appropriately groomed, obese body habitus. Awake, responsive, minimal conversation HEAD: Normocephalic, atraumatic mild right facial droop EYES: Conjunctiva/lids clear. Pupils round, reactive.   EARS: External exam WNL, . Hearing grossly normal. NOSE: No deformity or discharge. MOUTH/THROAT: Lips w/o lesions. Oral mucosa, tongue moist, w/o lesion. Oropharynx w/o redness or lesions.  NECK: Supple, full ROM. No thyroid tenderness, enlargement or nodule LYMPHATICS: No head, neck or supraclavicular adenopathy RESPIRATORY: Breathing is even, unlabored. Lungs a few crackles at bases CARDIOVASCULAR: Heart RRR. No murmur or extra heart sounds  ARTERIAL: No carotid bruit. 2+ pulse  DP   EDEMA: No peripheral or periorbital edema.  GASTROINTESTINAL: Abdomen is soft, non-tender, Obese, increased BS, incontinent loose stool, incontinent rash present on buttocks and upper inner thighs. She is watched while eating her dinner. She is on small bites and chopped foods. She is swallowing without coughing. GENITOURINARY: Bladder non tender, not distended. Foley catheter in place. NEUROLOGIC: Not oriented to time, place, recognizes familiar people, called me by name. Flaccid right arm and hand, with 1+  edema. Moves toes otherwise flaccid right leg. Speech clear, very soft no tremor. Patient has a hyperactive grasp on the right side. She can make weak pinching movements with the right hand. PSYCHIATRIC: Mood and affect appropriate to situation   Recent Labs  06/29/12 0624 06/30/12 0920 07/01/12 0725  NA 141 141 143  K 3.9 3.9 3.8  CL 105 104 105  CO2 26 28 26   GLUCOSE 134* 148* 94  BUN 11 10 9   CREATININE 0.55 0.60 0.55  CALCIUM 9.0 9.1 9.2   CBC:  Recent Labs  06/28/12 1252 06/29/12 0624 06/30/12 0920 07/01/12 0725  WBC 10.0 7.9 7.6 7.0  NEUTROABS 8.0*  --   --   --   HGB 13.7 12.5 13.5 13.3  HCT 39.4 36.4 38.8 39.3  MCV  89.5 90.1 91.5 92.5  PLT 150 153 140* 144*   Cardiac Enzymes:  Recent Labs  06/28/12 1640 06/28/12 2200 06/29/12 0624  TROPONINI 0.75* 1.22* 0.77*   CBG:  Recent Labs  06/29/12 0947 06/30/12 1357  GLUCAP 125* 94   07/07/12 BMP: Potassium 3.1  Radiological Exams: 06/28/12: CT cervical spine, CT head, chest x-ray: Significant degenerative cervical spine disease. 06/29/12: CT head without contrast: Cerebral atrophy. Left parietal CVA of the corona radiata.   ASSESSMENT/PLAN    ICD-9-CM  1. HTN (hypertension) Under control and current medication  401.9  2. Hypokalemia Added KCl 20 mEq daily    3. NSTEMI (non-ST elevated myocardial infarction) Patient appears to be recovering uneventfully from this. No signs of congestive heart failure. No angina.  410.70  4. Dysphagia due to recent stroke Patient tolerating current diet of small bites . She finds this an improvement over the pured diet she previously was offered. 438.82  5. Hypothyroidism Stable on current medication  244.9  7. Type II or unspecified type diabetes mellitus without mention of complication, not stated as uncontrolled Currently under good control  250.00  8. CVA (cerebral infarction) Slowly recovering. Right hemiparesis will be addressed with strengthening and returning in physical therapy and occupational therapy. She remains on Plavix.  434.91  9. UTI (lower urinary tract infection) Fully treated.  599.0  10. Urinary retention Currently with Foley catheter in place. Urine culture pending.  788.20  11. Breast cancer, unspecified laterality No evidence of relapse  174.9  12. Anxiety state, unspecified Coping with current situation  300.00  13. Diarrhea Etiology has not been determined. She was treated with antibiotics in the hospital for her urinary tract infection. There has been no blood in the stool. Possibility of C. difficile overgrowth is present. Stool is to be tested for C. difficile toxin  assay.Family says that she has a history of lactose intolerance. Lactose-free diet will be ordered.  787.91     LAB: 07/10/12 CBC, CMP. TSH  07/08/12 c. difficile toxin assay Follow up: As needed

## 2012-07-07 NOTE — Assessment & Plan Note (Signed)
3 CVA 06/28/2012 with resultant right patient will need physical, occupational and speech therapy to regain maximal functional status. Patient is currently very debilitated and does require skilled nursing care to his discharge summary indicated outpatient follow up with Dr. Pearlean Brownie in the future.

## 2012-07-07 NOTE — Assessment & Plan Note (Signed)
Elevated troponin values on hospital admission. Transthoracic transthoracic echocardiogram showed preserved ejection fraction no wall motion abnormalities. Antihypertensive medicines are on hold to allow for some permissive hypertension related to recent CVA.

## 2012-07-07 NOTE — Assessment & Plan Note (Signed)
Multiple incontinent loose bowel movements since admission to rehabilitation. No abdominal pain, fever recent WBC within normal limits. This is possibly food or medication related. We'll stop Ensure for now, simplify medication list

## 2012-07-07 NOTE — Assessment & Plan Note (Signed)
UTI with Escherichia coli was noted on hospital admission. Patient was treated with 5 days of Rocephin. Continue to monitor for any signs of recurrent infection.

## 2012-07-08 DIAGNOSIS — I69959 Hemiplegia and hemiparesis following unspecified cerebrovascular disease affecting unspecified side: Secondary | ICD-10-CM | POA: Diagnosis not present

## 2012-07-08 DIAGNOSIS — M6281 Muscle weakness (generalized): Secondary | ICD-10-CM | POA: Diagnosis not present

## 2012-07-08 DIAGNOSIS — R269 Unspecified abnormalities of gait and mobility: Secondary | ICD-10-CM | POA: Diagnosis not present

## 2012-07-08 DIAGNOSIS — H547 Unspecified visual loss: Secondary | ICD-10-CM | POA: Diagnosis not present

## 2012-07-08 DIAGNOSIS — G8100 Flaccid hemiplegia affecting unspecified side: Secondary | ICD-10-CM | POA: Diagnosis not present

## 2012-07-08 DIAGNOSIS — I69919 Unspecified symptoms and signs involving cognitive functions following unspecified cerebrovascular disease: Secondary | ICD-10-CM | POA: Diagnosis not present

## 2012-07-08 DIAGNOSIS — R197 Diarrhea, unspecified: Secondary | ICD-10-CM | POA: Diagnosis not present

## 2012-07-09 DIAGNOSIS — G8100 Flaccid hemiplegia affecting unspecified side: Secondary | ICD-10-CM | POA: Diagnosis not present

## 2012-07-09 DIAGNOSIS — R269 Unspecified abnormalities of gait and mobility: Secondary | ICD-10-CM | POA: Diagnosis not present

## 2012-07-09 DIAGNOSIS — I69919 Unspecified symptoms and signs involving cognitive functions following unspecified cerebrovascular disease: Secondary | ICD-10-CM | POA: Diagnosis not present

## 2012-07-09 DIAGNOSIS — M6281 Muscle weakness (generalized): Secondary | ICD-10-CM | POA: Diagnosis not present

## 2012-07-09 DIAGNOSIS — H547 Unspecified visual loss: Secondary | ICD-10-CM | POA: Diagnosis not present

## 2012-07-09 DIAGNOSIS — I69959 Hemiplegia and hemiparesis following unspecified cerebrovascular disease affecting unspecified side: Secondary | ICD-10-CM | POA: Diagnosis not present

## 2012-07-10 DIAGNOSIS — G8100 Flaccid hemiplegia affecting unspecified side: Secondary | ICD-10-CM | POA: Diagnosis not present

## 2012-07-10 DIAGNOSIS — N390 Urinary tract infection, site not specified: Secondary | ICD-10-CM | POA: Diagnosis not present

## 2012-07-10 DIAGNOSIS — M6281 Muscle weakness (generalized): Secondary | ICD-10-CM | POA: Diagnosis not present

## 2012-07-10 DIAGNOSIS — R269 Unspecified abnormalities of gait and mobility: Secondary | ICD-10-CM | POA: Diagnosis not present

## 2012-07-10 DIAGNOSIS — H547 Unspecified visual loss: Secondary | ICD-10-CM | POA: Diagnosis not present

## 2012-07-10 DIAGNOSIS — I69959 Hemiplegia and hemiparesis following unspecified cerebrovascular disease affecting unspecified side: Secondary | ICD-10-CM | POA: Diagnosis not present

## 2012-07-10 DIAGNOSIS — I10 Essential (primary) hypertension: Secondary | ICD-10-CM | POA: Diagnosis not present

## 2012-07-10 DIAGNOSIS — I69919 Unspecified symptoms and signs involving cognitive functions following unspecified cerebrovascular disease: Secondary | ICD-10-CM | POA: Diagnosis not present

## 2012-07-10 DIAGNOSIS — E039 Hypothyroidism, unspecified: Secondary | ICD-10-CM | POA: Diagnosis not present

## 2012-07-11 DIAGNOSIS — I69919 Unspecified symptoms and signs involving cognitive functions following unspecified cerebrovascular disease: Secondary | ICD-10-CM | POA: Diagnosis not present

## 2012-07-11 DIAGNOSIS — H547 Unspecified visual loss: Secondary | ICD-10-CM | POA: Diagnosis not present

## 2012-07-11 DIAGNOSIS — R269 Unspecified abnormalities of gait and mobility: Secondary | ICD-10-CM | POA: Diagnosis not present

## 2012-07-11 DIAGNOSIS — M6281 Muscle weakness (generalized): Secondary | ICD-10-CM | POA: Diagnosis not present

## 2012-07-11 DIAGNOSIS — I69959 Hemiplegia and hemiparesis following unspecified cerebrovascular disease affecting unspecified side: Secondary | ICD-10-CM | POA: Diagnosis not present

## 2012-07-11 DIAGNOSIS — G8100 Flaccid hemiplegia affecting unspecified side: Secondary | ICD-10-CM | POA: Diagnosis not present

## 2012-07-14 DIAGNOSIS — I69919 Unspecified symptoms and signs involving cognitive functions following unspecified cerebrovascular disease: Secondary | ICD-10-CM | POA: Diagnosis not present

## 2012-07-14 DIAGNOSIS — H547 Unspecified visual loss: Secondary | ICD-10-CM | POA: Diagnosis not present

## 2012-07-14 DIAGNOSIS — G8100 Flaccid hemiplegia affecting unspecified side: Secondary | ICD-10-CM | POA: Diagnosis not present

## 2012-07-14 DIAGNOSIS — I69959 Hemiplegia and hemiparesis following unspecified cerebrovascular disease affecting unspecified side: Secondary | ICD-10-CM | POA: Diagnosis not present

## 2012-07-14 DIAGNOSIS — M6281 Muscle weakness (generalized): Secondary | ICD-10-CM | POA: Diagnosis not present

## 2012-07-14 DIAGNOSIS — R269 Unspecified abnormalities of gait and mobility: Secondary | ICD-10-CM | POA: Diagnosis not present

## 2012-07-15 ENCOUNTER — Non-Acute Institutional Stay (SKILLED_NURSING_FACILITY): Payer: Medicare Other | Admitting: Geriatric Medicine

## 2012-07-15 DIAGNOSIS — E876 Hypokalemia: Secondary | ICD-10-CM | POA: Diagnosis not present

## 2012-07-15 DIAGNOSIS — I6350 Cerebral infarction due to unspecified occlusion or stenosis of unspecified cerebral artery: Secondary | ICD-10-CM

## 2012-07-15 DIAGNOSIS — I10 Essential (primary) hypertension: Secondary | ICD-10-CM

## 2012-07-15 DIAGNOSIS — H547 Unspecified visual loss: Secondary | ICD-10-CM | POA: Diagnosis not present

## 2012-07-15 DIAGNOSIS — I639 Cerebral infarction, unspecified: Secondary | ICD-10-CM

## 2012-07-15 DIAGNOSIS — R269 Unspecified abnormalities of gait and mobility: Secondary | ICD-10-CM | POA: Diagnosis not present

## 2012-07-15 DIAGNOSIS — E119 Type 2 diabetes mellitus without complications: Secondary | ICD-10-CM | POA: Diagnosis not present

## 2012-07-15 DIAGNOSIS — I69959 Hemiplegia and hemiparesis following unspecified cerebrovascular disease affecting unspecified side: Secondary | ICD-10-CM | POA: Diagnosis not present

## 2012-07-15 DIAGNOSIS — G8100 Flaccid hemiplegia affecting unspecified side: Secondary | ICD-10-CM | POA: Diagnosis not present

## 2012-07-15 DIAGNOSIS — I69919 Unspecified symptoms and signs involving cognitive functions following unspecified cerebrovascular disease: Secondary | ICD-10-CM | POA: Diagnosis not present

## 2012-07-15 DIAGNOSIS — M6281 Muscle weakness (generalized): Secondary | ICD-10-CM | POA: Diagnosis not present

## 2012-07-15 MED ORDER — LOSARTAN POTASSIUM 50 MG PO TABS: 50.0000 mg | ORAL_TABLET | Freq: Every day | ORAL | Status: DC

## 2012-07-15 NOTE — Progress Notes (Signed)
Patient ID: Rachel Cooke, female   DOB: 1919/11/27, 79 y.o.   MRN: [id number] Chief Complaint  Patient presents with  . F/U CVA, HTN, DM, hypokalemia, Diarrhea    HPI: This 79 year old female resident of [facility], Independent Living section was sent to hospital on 06/28/2012 due to altered mental status. Evaluation revealed deep brain CVA; patient has resultant right hemiparesis, no aphasia but does have moderate dysphagia. This episode was further complicated by myocardial infarction, UTI. Patient was medically ready for discharge to skilled nursing rehabilitation at [facility] on 07/03/2012. Since arrival at [facility], patient has remained hemodynamically stable. She did require reinsertion of Foley catheter due to urinary retention. She is working with PT/OT/ST. Making some gains with right hand and right leg movement. Speech therapy is attempting to advance her diet consistency. The diarrhea has resolved though she does remain incontinent. Diabetes remains well controlled. Blood pressure is mildly elevated. Antihypertensives were put on hold during hospitalization. Lab today satisfactory except for hypokalemia.  Bathing: Dependent,  Bed Mobility: Dependent,  Bladder Management: Foley, Bowel Management: Incontinent , Diet / Swallowing: Dysphagia mechanical soft, Feeding: Maximal Assist,  Toileting / Clothing: Dependent,  Transfers: Dependent, Hoyer lift  Allergies No Known Allergies Medications reviewed   Data Reviewed    Radiology:   [id number], external 07/10/2012: WBC 7.0, hemoglobin 11.7, hematocrit 35.1, platelet 179.    Glucose 108, BUN 5, creatinine 0.57, sodium 145, potassium 3.3. Albumin 3.2   TSH 1.95    Review of Systems  DATA OBTAINED: from patient, nurse, medical record, GENERAL:  No fevers. Fatigues easily, decreased appetite  SKIN: Skin breakdown upper inner thighs and perineal area EYES: No eye pain, dryness or itching  No change in  vision EARS: No earache,  change in hearing NOSE: No congestion, drainage or bleeding MOUTH/THROAT: No mouth or tooth pain  tolerating her diet RESPIRATORY: No cough, wheezing, SOB CARDIAC: No chest pain,  No edema. GI: No abdominal pain  No N/V/D or constipation    GU: Foley MUSCULOSKELETAL:   No back pain  No muscle ache, pain,   NEUROLOGIC: No dizziness, fainting, headache  No change in mental status.  Rt. Sided hemiplegia PSYCHIATRIC: No feelings of anxiety, depression Sleeps well.  No behavior issue.   Physical Exam Filed Vitals:   07/15/12 1727  BP: 165/88  Pulse: 82  Temp: 97.6 F (36.4 C)  Resp: 16  Weight: 187 lb 12.8 oz (85.186 kg)  SpO2: 93%   Body mass index is 30.33 kg/(m^2).  GENERAL APPEARANCE: No acute distress, appropriately groomed, obese body habitus. Awake, responsive, minimal conversation  HEAD: Normocephalic, atraumatic mild right facial droop  EYES: Conjunctiva/lids clear. Pupils round, reactive.  EARS: External exam WNL, . Hearing grossly normal.  NOSE: No deformity or discharge.  MOUTH/THROAT: Lips w/o lesions. Oral mucosa, tongue moist, w/o lesion. Oropharynx w/o redness or lesions.  NECK: Supple, full ROM. No thyroid tenderness, enlargement or nodule  LYMPHATICS: No head, neck or supraclavicular adenopathy  RESPIRATORY: Breathing is even, unlabored. Lungs a few crackles at bases  CARDIOVASCULAR: Heart RRR. No murmur or extra heart sounds  ARTERIAL: No carotid bruit. 2+ pulse DP  EDEMA: No peripheral or periorbital edema.  GASTROINTESTINAL: Abdomen is soft, non-tender, Obese, increased BS, incontinent rash present on buttocks and upper inner thighs  GENITOURINARY: Bladder non tender, not distended. Foley draining clear urine  NEUROLOGIC: Not oriented to time, place, recognizes familiar people, called me by name. Flaccid right arm and hand, with 1+ edema. Moves toes  otherwise flaccid right leg. Speech clear, very soft no tremor.  PSYCHIATRIC: Mood and  affect appropriate to situation  ASSESSMENT/PLAN  Type II or unspecified type diabetes mellitus without mention of complication, not stated as uncontrolled CBG stable 106-127. No medication required at this time.  Hypokalemia K+ still a bit low, continue supplement, recheck 5/15.  HTN (hypertension) BP readings elevated 151-185/81-93. Restart losartan  CVA (cerebral infarction) Slow progress, continue PT/OT/ST. Anticipate Skilled level of care for extended period.    Follow up: As needed  Ledarius Leeson T.Viviane Semidey, NP-C 07/15/2012

## 2012-07-16 ENCOUNTER — Telehealth: Payer: Self-pay | Admitting: Neurology

## 2012-07-16 DIAGNOSIS — G8100 Flaccid hemiplegia affecting unspecified side: Secondary | ICD-10-CM | POA: Diagnosis not present

## 2012-07-16 DIAGNOSIS — R269 Unspecified abnormalities of gait and mobility: Secondary | ICD-10-CM | POA: Diagnosis not present

## 2012-07-16 DIAGNOSIS — I69959 Hemiplegia and hemiparesis following unspecified cerebrovascular disease affecting unspecified side: Secondary | ICD-10-CM | POA: Diagnosis not present

## 2012-07-16 DIAGNOSIS — H547 Unspecified visual loss: Secondary | ICD-10-CM | POA: Diagnosis not present

## 2012-07-16 DIAGNOSIS — M6281 Muscle weakness (generalized): Secondary | ICD-10-CM | POA: Diagnosis not present

## 2012-07-16 DIAGNOSIS — I69919 Unspecified symptoms and signs involving cognitive functions following unspecified cerebrovascular disease: Secondary | ICD-10-CM | POA: Diagnosis not present

## 2012-07-17 DIAGNOSIS — M6281 Muscle weakness (generalized): Secondary | ICD-10-CM | POA: Diagnosis not present

## 2012-07-17 DIAGNOSIS — E876 Hypokalemia: Secondary | ICD-10-CM | POA: Diagnosis not present

## 2012-07-17 DIAGNOSIS — G8100 Flaccid hemiplegia affecting unspecified side: Secondary | ICD-10-CM | POA: Diagnosis not present

## 2012-07-17 DIAGNOSIS — H547 Unspecified visual loss: Secondary | ICD-10-CM | POA: Diagnosis not present

## 2012-07-17 DIAGNOSIS — I69959 Hemiplegia and hemiparesis following unspecified cerebrovascular disease affecting unspecified side: Secondary | ICD-10-CM | POA: Diagnosis not present

## 2012-07-17 DIAGNOSIS — I69919 Unspecified symptoms and signs involving cognitive functions following unspecified cerebrovascular disease: Secondary | ICD-10-CM | POA: Diagnosis not present

## 2012-07-17 DIAGNOSIS — R269 Unspecified abnormalities of gait and mobility: Secondary | ICD-10-CM | POA: Diagnosis not present

## 2012-07-18 DIAGNOSIS — M6281 Muscle weakness (generalized): Secondary | ICD-10-CM | POA: Diagnosis not present

## 2012-07-18 DIAGNOSIS — R269 Unspecified abnormalities of gait and mobility: Secondary | ICD-10-CM | POA: Diagnosis not present

## 2012-07-18 DIAGNOSIS — G8100 Flaccid hemiplegia affecting unspecified side: Secondary | ICD-10-CM | POA: Diagnosis not present

## 2012-07-18 DIAGNOSIS — H547 Unspecified visual loss: Secondary | ICD-10-CM | POA: Diagnosis not present

## 2012-07-18 DIAGNOSIS — I69959 Hemiplegia and hemiparesis following unspecified cerebrovascular disease affecting unspecified side: Secondary | ICD-10-CM | POA: Diagnosis not present

## 2012-07-18 DIAGNOSIS — I69919 Unspecified symptoms and signs involving cognitive functions following unspecified cerebrovascular disease: Secondary | ICD-10-CM | POA: Diagnosis not present

## 2012-07-21 DIAGNOSIS — G8100 Flaccid hemiplegia affecting unspecified side: Secondary | ICD-10-CM | POA: Diagnosis not present

## 2012-07-21 DIAGNOSIS — M6281 Muscle weakness (generalized): Secondary | ICD-10-CM | POA: Diagnosis not present

## 2012-07-21 DIAGNOSIS — R269 Unspecified abnormalities of gait and mobility: Secondary | ICD-10-CM | POA: Diagnosis not present

## 2012-07-21 DIAGNOSIS — I69959 Hemiplegia and hemiparesis following unspecified cerebrovascular disease affecting unspecified side: Secondary | ICD-10-CM | POA: Diagnosis not present

## 2012-07-21 DIAGNOSIS — H547 Unspecified visual loss: Secondary | ICD-10-CM | POA: Diagnosis not present

## 2012-07-21 DIAGNOSIS — I69919 Unspecified symptoms and signs involving cognitive functions following unspecified cerebrovascular disease: Secondary | ICD-10-CM | POA: Diagnosis not present

## 2012-07-22 ENCOUNTER — Encounter: Payer: Self-pay | Admitting: Geriatric Medicine

## 2012-07-22 ENCOUNTER — Non-Acute Institutional Stay (SKILLED_NURSING_FACILITY): Payer: Medicare Other | Admitting: Geriatric Medicine

## 2012-07-22 DIAGNOSIS — I69919 Unspecified symptoms and signs involving cognitive functions following unspecified cerebrovascular disease: Secondary | ICD-10-CM | POA: Diagnosis not present

## 2012-07-22 DIAGNOSIS — I69391 Dysphagia following cerebral infarction: Secondary | ICD-10-CM

## 2012-07-22 DIAGNOSIS — L608 Other nail disorders: Secondary | ICD-10-CM | POA: Diagnosis not present

## 2012-07-22 DIAGNOSIS — E119 Type 2 diabetes mellitus without complications: Secondary | ICD-10-CM | POA: Diagnosis not present

## 2012-07-22 DIAGNOSIS — I6350 Cerebral infarction due to unspecified occlusion or stenosis of unspecified cerebral artery: Secondary | ICD-10-CM

## 2012-07-22 DIAGNOSIS — I69991 Dysphagia following unspecified cerebrovascular disease: Secondary | ICD-10-CM

## 2012-07-22 DIAGNOSIS — E1159 Type 2 diabetes mellitus with other circulatory complications: Secondary | ICD-10-CM | POA: Diagnosis not present

## 2012-07-22 DIAGNOSIS — G8100 Flaccid hemiplegia affecting unspecified side: Secondary | ICD-10-CM | POA: Diagnosis not present

## 2012-07-22 DIAGNOSIS — R269 Unspecified abnormalities of gait and mobility: Secondary | ICD-10-CM | POA: Diagnosis not present

## 2012-07-22 DIAGNOSIS — I69959 Hemiplegia and hemiparesis following unspecified cerebrovascular disease affecting unspecified side: Secondary | ICD-10-CM | POA: Diagnosis not present

## 2012-07-22 DIAGNOSIS — I639 Cerebral infarction, unspecified: Secondary | ICD-10-CM

## 2012-07-22 DIAGNOSIS — H547 Unspecified visual loss: Secondary | ICD-10-CM | POA: Diagnosis not present

## 2012-07-22 DIAGNOSIS — R339 Retention of urine, unspecified: Secondary | ICD-10-CM | POA: Diagnosis not present

## 2012-07-22 DIAGNOSIS — I10 Essential (primary) hypertension: Secondary | ICD-10-CM | POA: Diagnosis not present

## 2012-07-22 DIAGNOSIS — M6281 Muscle weakness (generalized): Secondary | ICD-10-CM | POA: Diagnosis not present

## 2012-07-22 DIAGNOSIS — L851 Acquired keratosis [keratoderma] palmaris et plantaris: Secondary | ICD-10-CM | POA: Diagnosis not present

## 2012-07-22 NOTE — Assessment & Plan Note (Signed)
CBG stable 106-127. No medication required at this time.

## 2012-07-22 NOTE — Assessment & Plan Note (Signed)
Slow progress, continue PT/OT/ST. Anticipate Skilled level of care for extended period.

## 2012-07-22 NOTE — Assessment & Plan Note (Signed)
K+ still a bit low, continue supplement, recheck 5/15.

## 2012-07-22 NOTE — Assessment & Plan Note (Signed)
BP readings elevated 151-185/81-93. Restart losartan

## 2012-07-22 NOTE — Progress Notes (Signed)
Patient ID: Rachel Cooke, female   DOB: 1919-09-27, 79 y.o.   MRN: [id number] [id number] [phone])  Chief Complaint  Patient presents with  . F/U CVA  . Dysphagia    [id number] 79 year old female resident of [facility], Independent Living section was sent to hospital on 06/28/2012 due to altered mental status. Evaluation revealed deep brain CVA; patient has resultant right hemiparesis, no aphasia but does have moderate dysphagia. This episode was further complicated by myocardial infarction, UTI. Patient has been residing in  Skilled nursing rehabilitation section at [facility] since 07/03/2012. Patient is working diligently with both PT/ OT and making small gains. She does remain at skilled level of care. Swallowing issue was noted over the weekend, patient reported food "feels cought". Diet was downgraded to pured, speech therapy is continuing to work with patient to improve swallowing.  Bathing: Dependent,  Bed Mobility: Dependent,  Bladder Management: Foley, Bowel Management: Incontinent  Diet / Swallowing: Pureed, Feeding: Maximal Assist,  Toileting / Clothing: Dependent,  Transfers: Dependent, Hoyer lift   Allergies No Known Allergies  Medications reviewed  Data Reviewed    Radiology:   [id number], external  07/10/2012: WBC 7.0, hemoglobin 11.7, hematocrit 35.1, platelet 179.  Glucose 108, BUN 5, creatinine 0.57, sodium 145, potassium 3.3. Albumin 3.2  TSH 1.95 07/17/2012: Glucose 11, BUN 7, creatinine 0.52, sodium 142, potassium 4.2    Review of Systems  DATA OBTAINED: from patient, nurse, medical record,  GENERAL: No fevers. Fatigues easily, decreased appetite  SKIN: Skin breakdown upper inner thighs and perineal area healing EYES: No eye pain, dryness or itching No change in vision  EARS: No earache, change in hearing  NOSE: No congestion, drainage or bleeding  MOUTH/THROAT: No mouth or tooth pain No sore throat See HPI   RESPIRATORY: No cough, wheezing, SOB  CARDIAC: No chest pain, No edema.  GI: No abdominal pain No N/V/D or constipation  GU: Foley  MUSCULOSKELETAL: No back pain No muscle ache, pain,  NEUROLOGIC: No dizziness, fainting, headache No change in mental status. Rt. Sided hemiplegia  PSYCHIATRIC: No feelings of anxiety, depression Sleeps well. No behavior issue  Physical Exam Filed Vitals:   07/22/12 1026  BP: 135/72  Pulse: 86  Temp: 97.6 F (36.4 C)  Resp: 16  SpO2: 95%    GENERAL APPEARANCE: No acute distress, appropriately groomed, obese body habitus. Awake, responsive, minimal conversation  HEAD: Normocephalic, atraumatic mild right facial droop  EYES: Conjunctiva/lids clear. Pupils round, reactive.  EARS: External exam WNL, Hearing grossly normal.  NOSE: No deformity or discharge.  MOUTH/THROAT: Lips w/o lesions. Oral mucosa, tongue moist, w/o lesion. Oropharynx w/o redness or lesions.  NECK: Supple, full ROM. No thyroid tenderness, enlargement or nodule  LYMPHATICS: No head, neck or supraclavicular adenopathy   RESPIRATORY: Breathing is even, unlabored. Weak expiration, soft voice CARDIOVASCULAR: Heart RRR. No murmur or extra heart sounds  ARTERIAL: No carotid bruit. 2+ pulse DP   EDEMA: No peripheral or periorbital edema.  GASTROINTESTINAL: Abdomen is soft, non-tender, Obese, Normal BS,  GENITOURINARY: Bladder non tender, not distended. Foley draining clear urine  NEUROLOGIC: Not oriented to time, place, recognizes familiar people, called me by name. Minimal movement rt hand, less edema of hand/ arm Moves toes otherwise flaccid right leg. Speech clear, very soft no tremor.  PSYCHIATRIC: Mood and affect appropriate to situation  ASSESSMENT/PLAN  No problem-specific assessment & plan notes found for this encounter.    Follow up:  Fain Francis T.Carinne Brandenburger, NP-C 07/22/2012

## 2012-07-23 DIAGNOSIS — G8100 Flaccid hemiplegia affecting unspecified side: Secondary | ICD-10-CM | POA: Diagnosis not present

## 2012-07-23 DIAGNOSIS — H547 Unspecified visual loss: Secondary | ICD-10-CM | POA: Diagnosis not present

## 2012-07-23 DIAGNOSIS — M6281 Muscle weakness (generalized): Secondary | ICD-10-CM | POA: Diagnosis not present

## 2012-07-23 DIAGNOSIS — I69959 Hemiplegia and hemiparesis following unspecified cerebrovascular disease affecting unspecified side: Secondary | ICD-10-CM | POA: Diagnosis not present

## 2012-07-23 DIAGNOSIS — I69919 Unspecified symptoms and signs involving cognitive functions following unspecified cerebrovascular disease: Secondary | ICD-10-CM | POA: Diagnosis not present

## 2012-07-23 DIAGNOSIS — R269 Unspecified abnormalities of gait and mobility: Secondary | ICD-10-CM | POA: Diagnosis not present

## 2012-07-24 DIAGNOSIS — G8100 Flaccid hemiplegia affecting unspecified side: Secondary | ICD-10-CM | POA: Diagnosis not present

## 2012-07-24 DIAGNOSIS — I69919 Unspecified symptoms and signs involving cognitive functions following unspecified cerebrovascular disease: Secondary | ICD-10-CM | POA: Diagnosis not present

## 2012-07-24 DIAGNOSIS — M6281 Muscle weakness (generalized): Secondary | ICD-10-CM | POA: Diagnosis not present

## 2012-07-24 DIAGNOSIS — H547 Unspecified visual loss: Secondary | ICD-10-CM | POA: Diagnosis not present

## 2012-07-24 DIAGNOSIS — I69959 Hemiplegia and hemiparesis following unspecified cerebrovascular disease affecting unspecified side: Secondary | ICD-10-CM | POA: Diagnosis not present

## 2012-07-24 DIAGNOSIS — R269 Unspecified abnormalities of gait and mobility: Secondary | ICD-10-CM | POA: Diagnosis not present

## 2012-07-25 ENCOUNTER — Telehealth: Payer: Self-pay | Admitting: Neurology

## 2012-07-25 DIAGNOSIS — I69919 Unspecified symptoms and signs involving cognitive functions following unspecified cerebrovascular disease: Secondary | ICD-10-CM | POA: Diagnosis not present

## 2012-07-25 DIAGNOSIS — M6281 Muscle weakness (generalized): Secondary | ICD-10-CM | POA: Diagnosis not present

## 2012-07-25 DIAGNOSIS — R269 Unspecified abnormalities of gait and mobility: Secondary | ICD-10-CM | POA: Diagnosis not present

## 2012-07-25 DIAGNOSIS — G8100 Flaccid hemiplegia affecting unspecified side: Secondary | ICD-10-CM | POA: Diagnosis not present

## 2012-07-25 DIAGNOSIS — I69959 Hemiplegia and hemiparesis following unspecified cerebrovascular disease affecting unspecified side: Secondary | ICD-10-CM | POA: Diagnosis not present

## 2012-07-25 DIAGNOSIS — H547 Unspecified visual loss: Secondary | ICD-10-CM | POA: Diagnosis not present

## 2012-07-26 DIAGNOSIS — R1310 Dysphagia, unspecified: Secondary | ICD-10-CM | POA: Diagnosis not present

## 2012-07-26 DIAGNOSIS — D649 Anemia, unspecified: Secondary | ICD-10-CM | POA: Diagnosis not present

## 2012-07-28 DIAGNOSIS — I69919 Unspecified symptoms and signs involving cognitive functions following unspecified cerebrovascular disease: Secondary | ICD-10-CM | POA: Diagnosis not present

## 2012-07-28 DIAGNOSIS — G8100 Flaccid hemiplegia affecting unspecified side: Secondary | ICD-10-CM | POA: Diagnosis not present

## 2012-07-28 DIAGNOSIS — I69959 Hemiplegia and hemiparesis following unspecified cerebrovascular disease affecting unspecified side: Secondary | ICD-10-CM | POA: Diagnosis not present

## 2012-07-28 DIAGNOSIS — H547 Unspecified visual loss: Secondary | ICD-10-CM | POA: Diagnosis not present

## 2012-07-28 DIAGNOSIS — M6281 Muscle weakness (generalized): Secondary | ICD-10-CM | POA: Diagnosis not present

## 2012-07-28 DIAGNOSIS — R269 Unspecified abnormalities of gait and mobility: Secondary | ICD-10-CM | POA: Diagnosis not present

## 2012-07-29 DIAGNOSIS — I69919 Unspecified symptoms and signs involving cognitive functions following unspecified cerebrovascular disease: Secondary | ICD-10-CM | POA: Diagnosis not present

## 2012-07-29 DIAGNOSIS — H547 Unspecified visual loss: Secondary | ICD-10-CM | POA: Diagnosis not present

## 2012-07-29 DIAGNOSIS — M6281 Muscle weakness (generalized): Secondary | ICD-10-CM | POA: Diagnosis not present

## 2012-07-29 DIAGNOSIS — G8100 Flaccid hemiplegia affecting unspecified side: Secondary | ICD-10-CM | POA: Diagnosis not present

## 2012-07-29 DIAGNOSIS — I69959 Hemiplegia and hemiparesis following unspecified cerebrovascular disease affecting unspecified side: Secondary | ICD-10-CM | POA: Diagnosis not present

## 2012-07-29 DIAGNOSIS — R269 Unspecified abnormalities of gait and mobility: Secondary | ICD-10-CM | POA: Diagnosis not present

## 2012-07-30 ENCOUNTER — Encounter: Payer: Self-pay | Admitting: Geriatric Medicine

## 2012-07-30 DIAGNOSIS — M6281 Muscle weakness (generalized): Secondary | ICD-10-CM | POA: Diagnosis not present

## 2012-07-30 DIAGNOSIS — G8100 Flaccid hemiplegia affecting unspecified side: Secondary | ICD-10-CM | POA: Diagnosis not present

## 2012-07-30 DIAGNOSIS — R269 Unspecified abnormalities of gait and mobility: Secondary | ICD-10-CM | POA: Diagnosis not present

## 2012-07-30 DIAGNOSIS — H547 Unspecified visual loss: Secondary | ICD-10-CM | POA: Diagnosis not present

## 2012-07-30 DIAGNOSIS — I69919 Unspecified symptoms and signs involving cognitive functions following unspecified cerebrovascular disease: Secondary | ICD-10-CM | POA: Diagnosis not present

## 2012-07-30 DIAGNOSIS — I69959 Hemiplegia and hemiparesis following unspecified cerebrovascular disease affecting unspecified side: Secondary | ICD-10-CM | POA: Diagnosis not present

## 2012-07-31 DIAGNOSIS — I69959 Hemiplegia and hemiparesis following unspecified cerebrovascular disease affecting unspecified side: Secondary | ICD-10-CM | POA: Diagnosis not present

## 2012-07-31 DIAGNOSIS — H547 Unspecified visual loss: Secondary | ICD-10-CM | POA: Diagnosis not present

## 2012-07-31 DIAGNOSIS — M6281 Muscle weakness (generalized): Secondary | ICD-10-CM | POA: Diagnosis not present

## 2012-07-31 DIAGNOSIS — I69919 Unspecified symptoms and signs involving cognitive functions following unspecified cerebrovascular disease: Secondary | ICD-10-CM | POA: Diagnosis not present

## 2012-07-31 DIAGNOSIS — I495 Sick sinus syndrome: Secondary | ICD-10-CM | POA: Diagnosis not present

## 2012-07-31 DIAGNOSIS — R269 Unspecified abnormalities of gait and mobility: Secondary | ICD-10-CM | POA: Diagnosis not present

## 2012-07-31 DIAGNOSIS — G8100 Flaccid hemiplegia affecting unspecified side: Secondary | ICD-10-CM | POA: Diagnosis not present

## 2012-08-01 DIAGNOSIS — G8100 Flaccid hemiplegia affecting unspecified side: Secondary | ICD-10-CM | POA: Diagnosis not present

## 2012-08-01 DIAGNOSIS — M6281 Muscle weakness (generalized): Secondary | ICD-10-CM | POA: Diagnosis not present

## 2012-08-01 DIAGNOSIS — H547 Unspecified visual loss: Secondary | ICD-10-CM | POA: Diagnosis not present

## 2012-08-01 DIAGNOSIS — I69919 Unspecified symptoms and signs involving cognitive functions following unspecified cerebrovascular disease: Secondary | ICD-10-CM | POA: Diagnosis not present

## 2012-08-01 DIAGNOSIS — I69959 Hemiplegia and hemiparesis following unspecified cerebrovascular disease affecting unspecified side: Secondary | ICD-10-CM | POA: Diagnosis not present

## 2012-08-01 DIAGNOSIS — R269 Unspecified abnormalities of gait and mobility: Secondary | ICD-10-CM | POA: Diagnosis not present

## 2012-08-04 DIAGNOSIS — R269 Unspecified abnormalities of gait and mobility: Secondary | ICD-10-CM | POA: Diagnosis not present

## 2012-08-04 DIAGNOSIS — G8100 Flaccid hemiplegia affecting unspecified side: Secondary | ICD-10-CM | POA: Diagnosis not present

## 2012-08-04 DIAGNOSIS — I69959 Hemiplegia and hemiparesis following unspecified cerebrovascular disease affecting unspecified side: Secondary | ICD-10-CM | POA: Diagnosis not present

## 2012-08-04 DIAGNOSIS — M6281 Muscle weakness (generalized): Secondary | ICD-10-CM | POA: Diagnosis not present

## 2012-08-04 DIAGNOSIS — H547 Unspecified visual loss: Secondary | ICD-10-CM | POA: Diagnosis not present

## 2012-08-04 DIAGNOSIS — R1312 Dysphagia, oropharyngeal phase: Secondary | ICD-10-CM | POA: Diagnosis not present

## 2012-08-04 DIAGNOSIS — R279 Unspecified lack of coordination: Secondary | ICD-10-CM | POA: Diagnosis not present

## 2012-08-04 DIAGNOSIS — I69919 Unspecified symptoms and signs involving cognitive functions following unspecified cerebrovascular disease: Secondary | ICD-10-CM | POA: Diagnosis not present

## 2012-08-05 DIAGNOSIS — I69919 Unspecified symptoms and signs involving cognitive functions following unspecified cerebrovascular disease: Secondary | ICD-10-CM | POA: Diagnosis not present

## 2012-08-05 DIAGNOSIS — M6281 Muscle weakness (generalized): Secondary | ICD-10-CM | POA: Diagnosis not present

## 2012-08-05 DIAGNOSIS — H547 Unspecified visual loss: Secondary | ICD-10-CM | POA: Diagnosis not present

## 2012-08-05 DIAGNOSIS — R269 Unspecified abnormalities of gait and mobility: Secondary | ICD-10-CM | POA: Diagnosis not present

## 2012-08-05 DIAGNOSIS — G8100 Flaccid hemiplegia affecting unspecified side: Secondary | ICD-10-CM | POA: Diagnosis not present

## 2012-08-05 DIAGNOSIS — I69959 Hemiplegia and hemiparesis following unspecified cerebrovascular disease affecting unspecified side: Secondary | ICD-10-CM | POA: Diagnosis not present

## 2012-08-06 DIAGNOSIS — I69919 Unspecified symptoms and signs involving cognitive functions following unspecified cerebrovascular disease: Secondary | ICD-10-CM | POA: Diagnosis not present

## 2012-08-06 DIAGNOSIS — R269 Unspecified abnormalities of gait and mobility: Secondary | ICD-10-CM | POA: Diagnosis not present

## 2012-08-06 DIAGNOSIS — M6281 Muscle weakness (generalized): Secondary | ICD-10-CM | POA: Diagnosis not present

## 2012-08-06 DIAGNOSIS — H547 Unspecified visual loss: Secondary | ICD-10-CM | POA: Diagnosis not present

## 2012-08-06 DIAGNOSIS — I69959 Hemiplegia and hemiparesis following unspecified cerebrovascular disease affecting unspecified side: Secondary | ICD-10-CM | POA: Diagnosis not present

## 2012-08-06 DIAGNOSIS — G8100 Flaccid hemiplegia affecting unspecified side: Secondary | ICD-10-CM | POA: Diagnosis not present

## 2012-08-07 DIAGNOSIS — H547 Unspecified visual loss: Secondary | ICD-10-CM | POA: Diagnosis not present

## 2012-08-07 DIAGNOSIS — I69959 Hemiplegia and hemiparesis following unspecified cerebrovascular disease affecting unspecified side: Secondary | ICD-10-CM | POA: Diagnosis not present

## 2012-08-07 DIAGNOSIS — G8100 Flaccid hemiplegia affecting unspecified side: Secondary | ICD-10-CM | POA: Diagnosis not present

## 2012-08-07 DIAGNOSIS — R269 Unspecified abnormalities of gait and mobility: Secondary | ICD-10-CM | POA: Diagnosis not present

## 2012-08-07 DIAGNOSIS — I69919 Unspecified symptoms and signs involving cognitive functions following unspecified cerebrovascular disease: Secondary | ICD-10-CM | POA: Diagnosis not present

## 2012-08-07 DIAGNOSIS — M6281 Muscle weakness (generalized): Secondary | ICD-10-CM | POA: Diagnosis not present

## 2012-08-08 DIAGNOSIS — G8100 Flaccid hemiplegia affecting unspecified side: Secondary | ICD-10-CM | POA: Diagnosis not present

## 2012-08-08 DIAGNOSIS — I69959 Hemiplegia and hemiparesis following unspecified cerebrovascular disease affecting unspecified side: Secondary | ICD-10-CM | POA: Diagnosis not present

## 2012-08-08 DIAGNOSIS — H547 Unspecified visual loss: Secondary | ICD-10-CM | POA: Diagnosis not present

## 2012-08-08 DIAGNOSIS — R269 Unspecified abnormalities of gait and mobility: Secondary | ICD-10-CM | POA: Diagnosis not present

## 2012-08-08 DIAGNOSIS — I69919 Unspecified symptoms and signs involving cognitive functions following unspecified cerebrovascular disease: Secondary | ICD-10-CM | POA: Diagnosis not present

## 2012-08-08 DIAGNOSIS — M6281 Muscle weakness (generalized): Secondary | ICD-10-CM | POA: Diagnosis not present

## 2012-08-08 NOTE — Assessment & Plan Note (Signed)
Continues his slow progress, small gains. Remains at skilled level of care.

## 2012-08-08 NOTE — Assessment & Plan Note (Signed)
Dysphagia a little bit worse this week; pt. ith with feeling that food is stuck after swalowong. Speech therapy is working closely with this patient to improve her swallowing abilities. Will add incentive spirometer to help increase chest muscle strength.

## 2012-08-09 DIAGNOSIS — I69959 Hemiplegia and hemiparesis following unspecified cerebrovascular disease affecting unspecified side: Secondary | ICD-10-CM | POA: Diagnosis not present

## 2012-08-09 DIAGNOSIS — I69919 Unspecified symptoms and signs involving cognitive functions following unspecified cerebrovascular disease: Secondary | ICD-10-CM | POA: Diagnosis not present

## 2012-08-09 DIAGNOSIS — M6281 Muscle weakness (generalized): Secondary | ICD-10-CM | POA: Diagnosis not present

## 2012-08-09 DIAGNOSIS — G8100 Flaccid hemiplegia affecting unspecified side: Secondary | ICD-10-CM | POA: Diagnosis not present

## 2012-08-09 DIAGNOSIS — R269 Unspecified abnormalities of gait and mobility: Secondary | ICD-10-CM | POA: Diagnosis not present

## 2012-08-09 DIAGNOSIS — H547 Unspecified visual loss: Secondary | ICD-10-CM | POA: Diagnosis not present

## 2012-08-11 ENCOUNTER — Encounter: Payer: Self-pay | Admitting: Geriatric Medicine

## 2012-08-11 ENCOUNTER — Non-Acute Institutional Stay (SKILLED_NURSING_FACILITY): Payer: Medicare Other | Admitting: Geriatric Medicine

## 2012-08-11 DIAGNOSIS — I69991 Dysphagia following unspecified cerebrovascular disease: Secondary | ICD-10-CM

## 2012-08-11 DIAGNOSIS — R269 Unspecified abnormalities of gait and mobility: Secondary | ICD-10-CM | POA: Diagnosis not present

## 2012-08-11 DIAGNOSIS — R339 Retention of urine, unspecified: Secondary | ICD-10-CM

## 2012-08-11 DIAGNOSIS — I69959 Hemiplegia and hemiparesis following unspecified cerebrovascular disease affecting unspecified side: Secondary | ICD-10-CM | POA: Diagnosis not present

## 2012-08-11 DIAGNOSIS — I639 Cerebral infarction, unspecified: Secondary | ICD-10-CM

## 2012-08-11 DIAGNOSIS — I10 Essential (primary) hypertension: Secondary | ICD-10-CM

## 2012-08-11 DIAGNOSIS — I6350 Cerebral infarction due to unspecified occlusion or stenosis of unspecified cerebral artery: Secondary | ICD-10-CM | POA: Diagnosis not present

## 2012-08-11 DIAGNOSIS — Z66 Do not resuscitate: Secondary | ICD-10-CM

## 2012-08-11 DIAGNOSIS — E119 Type 2 diabetes mellitus without complications: Secondary | ICD-10-CM | POA: Diagnosis not present

## 2012-08-11 DIAGNOSIS — M6281 Muscle weakness (generalized): Secondary | ICD-10-CM | POA: Diagnosis not present

## 2012-08-11 DIAGNOSIS — I69919 Unspecified symptoms and signs involving cognitive functions following unspecified cerebrovascular disease: Secondary | ICD-10-CM | POA: Diagnosis not present

## 2012-08-11 DIAGNOSIS — I69391 Dysphagia following cerebral infarction: Secondary | ICD-10-CM

## 2012-08-11 DIAGNOSIS — G8100 Flaccid hemiplegia affecting unspecified side: Secondary | ICD-10-CM | POA: Diagnosis not present

## 2012-08-11 DIAGNOSIS — H547 Unspecified visual loss: Secondary | ICD-10-CM | POA: Diagnosis not present

## 2012-08-11 NOTE — Progress Notes (Signed)
Patient ID: Rachel Cooke, female   DOB: [birth year], 79 y.o.   MRN: [id number] [facility] [phone])  Chief Complaint  Patient presents with  . Medical Managment of Chronic Issues    F/U move to Skilled unit  . Cerebrovascular Accident    Dysphagia, Rt. hemiparesis  . Diabetes    [id number] 79 year old female resident of [facility], Independent Living section was sent to hospital on 06/28/2012 due to altered mental status. Evaluation revealed deep brain CVA; patient has resultant right hemiparesis, no aphasia but does have moderate dysphagia. This episode was further complicated by myocardial infarction, UTI. Patient has been residing in  Skilled nursing rehabilitation section at [facility] since 07/03/2012, working working diligently with both PT/ OT and making progress with functional status. She does remain at skilled level of care. Swallowing issue has improved, diet upgraded to regular recently.   Bathing: Max Assist  Bed Mobility: Max Assist,  Bladder Management: Intermittent incontinence, Bowel Management: Intermittent Incontinence  Diet / Swallowing: Regular, Feeding: Mod Assist,  Toileting / Clothing: Max assist,  Transfers: Dependent, Stand up lift   Allergies No Known Allergies  Medications reviewed  Data Reviewed    Radiology:   [id number], external  07/10/2012: WBC 7.0, hemoglobin 11.7, hematocrit 35.1, platelet 179.   Glucose 108, BUN 5, creatinine 0.57, sodium 145, potassium 3.3. Albumin 3.2   TSH 1.95 07/17/2012: Glucose 11, BUN 7, creatinine 0.52, sodium 142, potassium 4.2    Review of Systems  DATA OBTAINED: from patient, nurse, medical record,  GENERAL: "I feel good" No fevers. Appetite and activity tolerance have improved,  SKIN: Skin breakdown upper inner thighs and perineal area healed EYES: No eye pain, dryness or itching No change in vision  EARS: No earache, change in hearing  NOSE: No congestion, drainage or  bleeding  MOUTH/THROAT: No mouth or tooth pain No sore throat  No dysphagia RESPIRATORY: No cough, wheezing, SOB  CARDIAC: No chest pain, No edema.  GI: No abdominal pain No N/V/D or constipation   No heartburn or reflux ntermittent incontinence GU: No dysuria, frequency or urgency  No change in urine volume or character   Intermittent incontinence MUSCULOSKELETAL: No back pain No muscle ache, pain,  NEUROLOGIC: No dizziness, fainting, headache No change in mental status. Rt. Sided hemiparesis PSYCHIATRIC: No feelings of anxiety, depression Sleeps well. No behavior issue  Physical Exam Filed Vitals:   08/11/12 1624  BP: 142/89  Pulse: 92  Temp: 98.3 F (36.8 C)  Resp: 14  Weight: 178 lb (80.74 kg)  Body mass index is 28.74 kg/(m^2).  GENERAL APPEARANCE: No acute distress, appropriately groomed, obese body habitus. Alert, pleasant, conversant  HEAD: Normocephalic, atraumatic  EYES: Conjunctiva/lids clear. Pupils round, reactive.  EARS: External exam WNL, Hearing grossly normal.  NOSE: No deformity or discharge.  MOUTH/THROAT: Lips w/o lesions. Oral mucosa, tongue moist, w/o lesion. Oropharynx w/o redness or lesions.  NECK: Supple, full ROM. No thyroid tenderness, enlargement or nodule  LYMPHATICS: No head, neck or supraclavicular adenopathy   RESPIRATORY: Breathing is even, unlabored. Strong expirations voice is strong. Lung sounds clear and full all fields CARDIOVASCULAR: Heart RRR. No murmur or extra heart sounds  ARTERIAL: No carotid bruit. 2+ pulse DP   EDEMA: No peripheral or periorbital edema.  GASTROINTESTINAL: Abdomen is soft, non-tender, not distended w/ normal bowel sounds. NEUROLOGIC: Oriented to time, place, person. Speech clear, no tremor.  PSYCHIATRIC: Mood and affect appropriate to situation   ASSESSMENT/PLAN  Urinary retention Foley catheter removed  about 10 days ago. Patient has intermittent incontinence, mostly related to the time it takes to get to the  toilet. No issues with urinary retention.  Type II or unspecified type diabetes mellitus without mention of complication, not stated as uncontrolled Blood sugars well-controlled off medication, fasting CBGs 109-124 last 10 days. Continue to monitor blood sugar q AM, may require medication as she continues to recover and return to her usual eating pattern  HTN (hypertension) Blood pressure readings improved since losartan restarted, 124-158/71 and 91. Continue weekly monitoring. Most recent laboratory studies satisfactory  CVA (cerebral infarction) Patient continues to make progress in regaining functional status. Right arm has increased strength and coordination, right leg remains pretty weak however she is beginning to take a few steps. Continues to be skilled level of care; remains with a very positive attitude, is adjusting to new permanent skilled environment.  Dysphagia due to recent stroke Patient has been working diligently with Speech Therapy, has made very good progress. Is now eating a regular consistency diet. No recent cough or choking with meals.    Lab: 1 month CBC, CMP, TSH, A1C Follow up: Routine or as needed  . spent with patient, chart  Dreamer Carillo T.Vi Biddinger, NP-C 08/11/2012

## 2012-08-11 NOTE — Assessment & Plan Note (Signed)
Patient has been working diligently with Speech Therapy, has made very good progress. Is now eating a regular consistency diet. No recent cough or choking with meals.

## 2012-08-11 NOTE — Assessment & Plan Note (Signed)
Patient continues to make progress in regaining functional status. Right arm has increased strength and coordination, right leg remains pretty weak however she is beginning to take a few steps. Continues to be skilled level of care; remains with a very positive attitude, is adjusting to new permanent skilled environment.

## 2012-08-11 NOTE — Assessment & Plan Note (Signed)
Blood pressure readings improved since losartan restarted, 124-158/71 and 91. Continue weekly monitoring. Most recent laboratory studies satisfactory

## 2012-08-11 NOTE — Assessment & Plan Note (Signed)
Foley catheter removed about 10 days ago. Patient has intermittent incontinence, mostly related to the time it takes to get to the toilet. No issues with urinary retention.

## 2012-08-11 NOTE — Assessment & Plan Note (Signed)
Blood sugars well-controlled off medication, fasting CBGs 109-124 last 10 days. Continue to monitor blood sugar q AM, may require medication as she continues to recover and return to her usual eating pattern

## 2012-08-12 DIAGNOSIS — I69959 Hemiplegia and hemiparesis following unspecified cerebrovascular disease affecting unspecified side: Secondary | ICD-10-CM | POA: Diagnosis not present

## 2012-08-12 DIAGNOSIS — R269 Unspecified abnormalities of gait and mobility: Secondary | ICD-10-CM | POA: Diagnosis not present

## 2012-08-12 DIAGNOSIS — G8100 Flaccid hemiplegia affecting unspecified side: Secondary | ICD-10-CM | POA: Diagnosis not present

## 2012-08-12 DIAGNOSIS — H547 Unspecified visual loss: Secondary | ICD-10-CM | POA: Diagnosis not present

## 2012-08-12 DIAGNOSIS — M6281 Muscle weakness (generalized): Secondary | ICD-10-CM | POA: Diagnosis not present

## 2012-08-12 DIAGNOSIS — I69919 Unspecified symptoms and signs involving cognitive functions following unspecified cerebrovascular disease: Secondary | ICD-10-CM | POA: Diagnosis not present

## 2012-08-13 DIAGNOSIS — M6281 Muscle weakness (generalized): Secondary | ICD-10-CM | POA: Diagnosis not present

## 2012-08-13 DIAGNOSIS — I69959 Hemiplegia and hemiparesis following unspecified cerebrovascular disease affecting unspecified side: Secondary | ICD-10-CM | POA: Diagnosis not present

## 2012-08-13 DIAGNOSIS — G8100 Flaccid hemiplegia affecting unspecified side: Secondary | ICD-10-CM | POA: Diagnosis not present

## 2012-08-13 DIAGNOSIS — R269 Unspecified abnormalities of gait and mobility: Secondary | ICD-10-CM | POA: Diagnosis not present

## 2012-08-13 DIAGNOSIS — I69919 Unspecified symptoms and signs involving cognitive functions following unspecified cerebrovascular disease: Secondary | ICD-10-CM | POA: Diagnosis not present

## 2012-08-13 DIAGNOSIS — H547 Unspecified visual loss: Secondary | ICD-10-CM | POA: Diagnosis not present

## 2012-08-14 ENCOUNTER — Non-Acute Institutional Stay (SKILLED_NURSING_FACILITY): Payer: Medicare Other | Admitting: Geriatric Medicine

## 2012-08-14 ENCOUNTER — Other Ambulatory Visit (HOSPITAL_COMMUNITY): Payer: Self-pay | Admitting: Internal Medicine

## 2012-08-14 DIAGNOSIS — R269 Unspecified abnormalities of gait and mobility: Secondary | ICD-10-CM | POA: Diagnosis not present

## 2012-08-14 DIAGNOSIS — R1310 Dysphagia, unspecified: Secondary | ICD-10-CM

## 2012-08-14 DIAGNOSIS — I69959 Hemiplegia and hemiparesis following unspecified cerebrovascular disease affecting unspecified side: Secondary | ICD-10-CM | POA: Diagnosis not present

## 2012-08-14 DIAGNOSIS — H547 Unspecified visual loss: Secondary | ICD-10-CM | POA: Diagnosis not present

## 2012-08-14 DIAGNOSIS — I69919 Unspecified symptoms and signs involving cognitive functions following unspecified cerebrovascular disease: Secondary | ICD-10-CM | POA: Diagnosis not present

## 2012-08-14 DIAGNOSIS — M6281 Muscle weakness (generalized): Secondary | ICD-10-CM | POA: Diagnosis not present

## 2012-08-14 DIAGNOSIS — G8100 Flaccid hemiplegia affecting unspecified side: Secondary | ICD-10-CM | POA: Diagnosis not present

## 2012-08-14 NOTE — Progress Notes (Signed)
Patient ID: Rachel Cooke, female   DOB: 05/12/1919, 79 y.o.   MRN: [id number] [facility] [phone])  Chief Complaint  Patient presents with  . Dysphagia    HPI: This is a 79 y.o. female resident of [facility],  Skilled Nursing  section.  Evaluation is requested today due to sudden onset inabilty to swallow food/ fluid day.  Patient is status post CVA May 2014 with resultant right hemiparesis and dysphagia. The swallowing issue has improved significantly over the last few weeks;  diet was advanced from pured to mechanical soft and recently to regular consistency without difficulty until yesterday. I was told she had a choking episode. Has been NPO for the last 24 hours.Today, when swallowing small sip of water, she feels the need to belch, is not able to swallow the water.   Allergies No Known Allergies  Medications Reviewed  Data Reviewed       WUX:[id number], external  07/10/2012: WBC 7.0, hemoglobin 11.7, hematocrit 35.1, platelet 179.  Glucose 108, BUN 5, creatinine 0.57, sodium 145, potassium 3.3. Albumin 3.2  TSH 1.95  07/17/2012: Glucose 11, BUN 7, creatinine 0.52, sodium 142, potassium 4.2    Review of Systems  DATA OBTAINED: from patient, nurse, medical record,  GENERAL: Feels well, Hungry NOSE: No congestion, drainage or bleeding MOUTH/THROAT: No mouth or tooth pain  No sore throat See HPI RESPIRATORY: No cough, wheezing, SOB CARDIAC: No chest pain, palpitations  No edema. GI: No abdominal pain  No N/V/D or constipation  No heartburn or reflux  NEUROLOGIC: No dizziness, fainting, headache,  No change in mental status.   PSYCHIATRIC: No feelings of anxiety, depression Sleeps well.  No behavior issue.    Physical Exam Filed Vitals:   08/14/12 1120  BP: 137/73  Pulse: 98  Temp: 97.4 F (36.3 C)  Resp: 19  SpO2: 94%   GENERAL APPEARANCE: No acute distress, appropriately groomed, obese body habitus. Alert, pleasant, conversant   HEAD: Normocephalic, atraumatic  EYES: Conjunctiva/lids clear. Pupils round, reactive.  EARS: External exam WNL, Hearing grossly normal.  NOSE: No deformity or discharge.  MOUTH/THROAT: Lips w/o lesions. Oral mucosa, tongue moist, w/o lesion. Oropharynx w/o redness or lesions. Swallow is impaired, sounds as if partial obstruction, with air entrapment NECK: Supple, full ROM. No thyroid tenderness, enlargement or nodule  LYMPHATICS: No head, neck or supraclavicular adenopathy  RESPIRATORY: Breathing is even, unlabored. Strong expirations voice is strong. Lung sounds clear and full all fields  CARDIOVASCULAR: Heart RRR. No murmur or extra heart sounds  GASTROINTESTINAL: Abdomen is soft, non-tender, not distended w/ normal bowel sounds.  NEUROLOGIC: Oriented to time, place, person. Speech clear, no tremor. Left arm/leg weakness- requires mechanical lift for transers PSYCHIATRIC: Mood and affect appropriate to situation  ASSESSMENT/PLAN  Swallowing dysfunction New onset swallowing difficulty - different from dysphagia re: recent CVA.  Impairment appears to be just past oropharynx. ST is here- recommends MBSS for further evaluation. Scheduled 08/15/12. Keep pt. NPO, including meds,  start IVF to maintain hydration.   Follow up: AS needed  Dinna Severs T.Jemari Hallum, NP-C 08/14/2012

## 2012-08-14 NOTE — Assessment & Plan Note (Addendum)
New onset swallowing difficulty - different from dysphagia re: recent CVA.  Impairment appears to be just past oropharynx. ST is here- recommends MBSS for further evaluation. Scheduled 08/15/12. Keep pt. NPO, including meds,  start IVF to maintain hydration.

## 2012-08-15 ENCOUNTER — Encounter (HOSPITAL_COMMUNITY)
Admission: AD | Disposition: A | Discharge status: Skilled Nursing Facility | Payer: Self-pay | Source: Ambulatory Visit | Attending: Internal Medicine

## 2012-08-15 ENCOUNTER — Ambulatory Visit (HOSPITAL_COMMUNITY)
Admission: RE | Admit: 2012-08-15 | Discharge: 2012-08-15 | Disposition: A | Discharge status: Home / Self Care | Payer: Medicare Other | Source: Ambulatory Visit | Attending: Internal Medicine | Admitting: Internal Medicine

## 2012-08-15 ENCOUNTER — Other Ambulatory Visit: Payer: Self-pay | Admitting: Gastroenterology

## 2012-08-15 ENCOUNTER — Encounter (HOSPITAL_COMMUNITY): Payer: Self-pay | Admitting: Certified Registered"

## 2012-08-15 ENCOUNTER — Inpatient Hospital Stay (HOSPITAL_COMMUNITY)
Admission: AD | Admit: 2012-08-15 | Discharge: 2012-08-19 | DRG: 394 | Disposition: A | Discharge status: Skilled Nursing Facility | Payer: Medicare Other | Source: Ambulatory Visit | Attending: Internal Medicine | Admitting: Internal Medicine

## 2012-08-15 ENCOUNTER — Ambulatory Visit (HOSPITAL_COMMUNITY): Payer: Medicare Other | Admitting: Certified Registered"

## 2012-08-15 ENCOUNTER — Encounter (HOSPITAL_COMMUNITY): Payer: Self-pay | Admitting: Gastroenterology

## 2012-08-15 DIAGNOSIS — I69959 Hemiplegia and hemiparesis following unspecified cerebrovascular disease affecting unspecified side: Secondary | ICD-10-CM

## 2012-08-15 DIAGNOSIS — E039 Hypothyroidism, unspecified: Secondary | ICD-10-CM | POA: Diagnosis present

## 2012-08-15 DIAGNOSIS — I69391 Dysphagia following cerebral infarction: Secondary | ICD-10-CM

## 2012-08-15 DIAGNOSIS — M810 Age-related osteoporosis without current pathological fracture: Secondary | ICD-10-CM | POA: Diagnosis present

## 2012-08-15 DIAGNOSIS — N281 Cyst of kidney, acquired: Secondary | ICD-10-CM | POA: Diagnosis present

## 2012-08-15 DIAGNOSIS — E876 Hypokalemia: Secondary | ICD-10-CM

## 2012-08-15 DIAGNOSIS — E781 Pure hyperglyceridemia: Secondary | ICD-10-CM | POA: Diagnosis present

## 2012-08-15 DIAGNOSIS — E7800 Pure hypercholesterolemia, unspecified: Secondary | ICD-10-CM | POA: Diagnosis present

## 2012-08-15 DIAGNOSIS — R1310 Dysphagia, unspecified: Secondary | ICD-10-CM

## 2012-08-15 DIAGNOSIS — I69991 Dysphagia following unspecified cerebrovascular disease: Secondary | ICD-10-CM | POA: Diagnosis not present

## 2012-08-15 DIAGNOSIS — F411 Generalized anxiety disorder: Secondary | ICD-10-CM | POA: Diagnosis present

## 2012-08-15 DIAGNOSIS — H409 Unspecified glaucoma: Secondary | ICD-10-CM | POA: Diagnosis present

## 2012-08-15 DIAGNOSIS — Z79899 Other long term (current) drug therapy: Secondary | ICD-10-CM

## 2012-08-15 DIAGNOSIS — IMO0001 Reserved for inherently not codable concepts without codable children: Secondary | ICD-10-CM | POA: Diagnosis not present

## 2012-08-15 DIAGNOSIS — Z1839 Other retained organic fragments: Secondary | ICD-10-CM

## 2012-08-15 DIAGNOSIS — Z860100 Personal history of colon polyps, unspecified: Secondary | ICD-10-CM

## 2012-08-15 DIAGNOSIS — Z853 Personal history of malignant neoplasm of breast: Secondary | ICD-10-CM | POA: Diagnosis not present

## 2012-08-15 DIAGNOSIS — T18108A Unspecified foreign body in esophagus causing other injury, initial encounter: Principal | ICD-10-CM | POA: Diagnosis present

## 2012-08-15 DIAGNOSIS — I2510 Atherosclerotic heart disease of native coronary artery without angina pectoris: Secondary | ICD-10-CM | POA: Diagnosis present

## 2012-08-15 DIAGNOSIS — R933 Abnormal findings on diagnostic imaging of other parts of digestive tract: Secondary | ICD-10-CM | POA: Diagnosis not present

## 2012-08-15 DIAGNOSIS — G9341 Metabolic encephalopathy: Secondary | ICD-10-CM

## 2012-08-15 DIAGNOSIS — Z950 Presence of cardiac pacemaker: Secondary | ICD-10-CM | POA: Diagnosis not present

## 2012-08-15 DIAGNOSIS — IMO0002 Reserved for concepts with insufficient information to code with codable children: Secondary | ICD-10-CM | POA: Diagnosis present

## 2012-08-15 DIAGNOSIS — N390 Urinary tract infection, site not specified: Secondary | ICD-10-CM

## 2012-08-15 DIAGNOSIS — I639 Cerebral infarction, unspecified: Secondary | ICD-10-CM | POA: Diagnosis present

## 2012-08-15 DIAGNOSIS — R197 Diarrhea, unspecified: Secondary | ICD-10-CM

## 2012-08-15 DIAGNOSIS — I10 Essential (primary) hypertension: Secondary | ICD-10-CM | POA: Diagnosis present

## 2012-08-15 DIAGNOSIS — J301 Allergic rhinitis due to pollen: Secondary | ICD-10-CM | POA: Diagnosis present

## 2012-08-15 DIAGNOSIS — I442 Atrioventricular block, complete: Secondary | ICD-10-CM

## 2012-08-15 DIAGNOSIS — I252 Old myocardial infarction: Secondary | ICD-10-CM | POA: Diagnosis not present

## 2012-08-15 DIAGNOSIS — K209 Esophagitis, unspecified without bleeding: Secondary | ICD-10-CM | POA: Diagnosis not present

## 2012-08-15 DIAGNOSIS — I69919 Unspecified symptoms and signs involving cognitive functions following unspecified cerebrovascular disease: Secondary | ICD-10-CM | POA: Diagnosis not present

## 2012-08-15 DIAGNOSIS — I6350 Cerebral infarction due to unspecified occlusion or stenosis of unspecified cerebral artery: Secondary | ICD-10-CM | POA: Diagnosis not present

## 2012-08-15 DIAGNOSIS — I214 Non-ST elevation (NSTEMI) myocardial infarction: Secondary | ICD-10-CM

## 2012-08-15 DIAGNOSIS — W44F3XA Food entering into or through a natural orifice, initial encounter: Secondary | ICD-10-CM

## 2012-08-15 DIAGNOSIS — R633 Feeding difficulties, unspecified: Secondary | ICD-10-CM | POA: Diagnosis not present

## 2012-08-15 DIAGNOSIS — E119 Type 2 diabetes mellitus without complications: Secondary | ICD-10-CM | POA: Diagnosis not present

## 2012-08-15 DIAGNOSIS — T18128A Food in esophagus causing other injury, initial encounter: Secondary | ICD-10-CM

## 2012-08-15 DIAGNOSIS — Z66 Do not resuscitate: Secondary | ICD-10-CM | POA: Diagnosis present

## 2012-08-15 DIAGNOSIS — M1990 Unspecified osteoarthritis, unspecified site: Secondary | ICD-10-CM | POA: Diagnosis present

## 2012-08-15 DIAGNOSIS — H547 Unspecified visual loss: Secondary | ICD-10-CM | POA: Diagnosis not present

## 2012-08-15 DIAGNOSIS — R269 Unspecified abnormalities of gait and mobility: Secondary | ICD-10-CM | POA: Diagnosis not present

## 2012-08-15 DIAGNOSIS — H3530 Unspecified macular degeneration: Secondary | ICD-10-CM | POA: Diagnosis present

## 2012-08-15 DIAGNOSIS — K224 Dyskinesia of esophagus: Secondary | ICD-10-CM | POA: Diagnosis present

## 2012-08-15 DIAGNOSIS — T17408A Unspecified foreign body in trachea causing other injury, initial encounter: Secondary | ICD-10-CM | POA: Diagnosis not present

## 2012-08-15 DIAGNOSIS — G8100 Flaccid hemiplegia affecting unspecified side: Secondary | ICD-10-CM | POA: Diagnosis not present

## 2012-08-15 DIAGNOSIS — R339 Retention of urine, unspecified: Secondary | ICD-10-CM

## 2012-08-15 DIAGNOSIS — S27819A Unspecified injury of esophagus (thoracic part), initial encounter: Secondary | ICD-10-CM | POA: Diagnosis not present

## 2012-08-15 DIAGNOSIS — E785 Hyperlipidemia, unspecified: Secondary | ICD-10-CM

## 2012-08-15 DIAGNOSIS — E118 Type 2 diabetes mellitus with unspecified complications: Secondary | ICD-10-CM | POA: Diagnosis present

## 2012-08-15 DIAGNOSIS — M6281 Muscle weakness (generalized): Secondary | ICD-10-CM | POA: Diagnosis not present

## 2012-08-15 HISTORY — PX: FOREIGN BODY REMOVAL ESOPHAGEAL: SHX5322

## 2012-08-15 HISTORY — DX: Food in esophagus causing other injury, initial encounter: T18.128A

## 2012-08-15 HISTORY — PX: ESOPHAGOSCOPY: SHX5534

## 2012-08-15 HISTORY — DX: Presence of cardiac pacemaker: Z95.0

## 2012-08-15 HISTORY — PX: ESOPHAGOGASTRODUODENOSCOPY: SHX5428

## 2012-08-15 HISTORY — DX: Cerebral infarction, unspecified: I63.9

## 2012-08-15 LAB — GLUCOSE, CAPILLARY
Glucose-Capillary: 124 mg/dL — ABNORMAL HIGH (ref 70–99)
Glucose-Capillary: 132 mg/dL — ABNORMAL HIGH (ref 70–99)

## 2012-08-15 LAB — COMPREHENSIVE METABOLIC PANEL
AST: 60 U/L — ABNORMAL HIGH (ref 0–37)
BUN: 6 mg/dL (ref 6–23)
CO2: 21 mEq/L (ref 19–32)
Calcium: 9.5 mg/dL (ref 8.4–10.5)
Creatinine, Ser: 0.4 mg/dL — ABNORMAL LOW (ref 0.50–1.10)
GFR calc non Af Amer: 89 mL/min — ABNORMAL LOW (ref >90)
Total Bilirubin: 0.5 mg/dL (ref 0.3–1.2)

## 2012-08-15 SURGERY — EGD (ESOPHAGOGASTRODUODENOSCOPY)
Anesthesia: Moderate Sedation

## 2012-08-15 SURGERY — ESOPHAGOSCOPY
Anesthesia: General | Site: Throat | Wound class: Clean Contaminated

## 2012-08-15 MED ORDER — OXYMETAZOLINE HCL 0.05 % NA SOLN
NASAL | Status: AC
  Filled 2012-08-15: qty 15

## 2012-08-15 MED ORDER — SUCCINYLCHOLINE CHLORIDE 20 MG/ML IJ SOLN
INTRAMUSCULAR | Status: DC | PRN
  Administered 2012-08-15: 100 mg via INTRAVENOUS

## 2012-08-15 MED ORDER — SODIUM CHLORIDE 0.9 % IJ SOLN
3.0000 mL | Freq: Two times a day (BID) | INTRAMUSCULAR | Status: DC
  Administered 2012-08-15 – 2012-08-19 (×5): 3 mL via INTRAVENOUS

## 2012-08-15 MED ORDER — ONDANSETRON HCL 4 MG PO TABS: 4.0000 mg | ORAL_TABLET | Freq: Four times a day (QID) | ORAL | Status: DC | PRN

## 2012-08-15 MED ORDER — PROPOFOL 10 MG/ML IV BOLUS
INTRAVENOUS | Status: DC | PRN
  Administered 2012-08-15: 100 mg via INTRAVENOUS

## 2012-08-15 MED ORDER — DEXTROSE 5 % IV SOLN
750.0000 mg | Freq: Three times a day (TID) | INTRAVENOUS | Status: DC
  Administered 2012-08-15 – 2012-08-19 (×11): 750 mg via INTRAVENOUS
  Filled 2012-08-15 (×13): qty 750

## 2012-08-15 MED ORDER — FENTANYL CITRATE 0.05 MG/ML IJ SOLN
INTRAMUSCULAR | Status: DC | PRN
  Administered 2012-08-15: 25 ug via INTRAVENOUS
  Administered 2012-08-15: 12.5 ug via INTRAVENOUS

## 2012-08-15 MED ORDER — INSULIN ASPART 100 UNIT/ML ~~LOC~~ SOLN
0.0000 [IU] | SUBCUTANEOUS | Status: DC
  Administered 2012-08-16 – 2012-08-18 (×5): 1 [IU] via SUBCUTANEOUS

## 2012-08-15 MED ORDER — SODIUM CHLORIDE 0.9 % IV SOLN: INTRAVENOUS | Status: DC

## 2012-08-15 MED ORDER — ONDANSETRON HCL 4 MG/2ML IJ SOLN: 4.0000 mg | Freq: Four times a day (QID) | INTRAMUSCULAR | Status: DC | PRN

## 2012-08-15 MED ORDER — FENTANYL CITRATE 0.05 MG/ML IJ SOLN
INTRAMUSCULAR | Status: DC | PRN
  Administered 2012-08-15: 25 ug via INTRAVENOUS

## 2012-08-15 MED ORDER — HEPARIN SODIUM (PORCINE) 5000 UNIT/ML IJ SOLN
5000.0000 [IU] | Freq: Three times a day (TID) | INTRAMUSCULAR | Status: DC
  Administered 2012-08-15 – 2012-08-16 (×3): 5000 [IU] via SUBCUTANEOUS
  Filled 2012-08-15 (×5): qty 1

## 2012-08-15 MED ORDER — CEFUROXIME SODIUM 750 MG IJ SOLR
750.0000 mg | Freq: Three times a day (TID) | INTRAMUSCULAR | Status: DC
  Filled 2012-08-15 (×2): qty 750

## 2012-08-15 MED ORDER — PANTOPRAZOLE SODIUM 40 MG IV SOLR
40.0000 mg | Freq: Two times a day (BID) | INTRAVENOUS | Status: DC
  Administered 2012-08-15 – 2012-08-19 (×8): 40 mg via INTRAVENOUS
  Filled 2012-08-15 (×9): qty 40

## 2012-08-15 MED ORDER — PHENYLEPHRINE HCL 10 MG/ML IJ SOLN
INTRAMUSCULAR | Status: DC | PRN
  Administered 2012-08-15 (×2): 80 ug via INTRAVENOUS

## 2012-08-15 MED ORDER — LACTATED RINGERS IV SOLN
INTRAVENOUS | Status: DC | PRN
  Administered 2012-08-15: 16:00:00 via INTRAVENOUS

## 2012-08-15 MED ORDER — SODIUM CHLORIDE 0.9 % IJ SOLN
INTRAMUSCULAR | Status: AC
  Administered 2012-08-15: 3 mL
  Filled 2012-08-15: qty 10

## 2012-08-15 MED ORDER — ACETAMINOPHEN 10 MG/ML IV SOLN: 1000.0000 mg | Freq: Once | INTRAVENOUS | Status: DC | PRN

## 2012-08-15 MED ORDER — DROPERIDOL 2.5 MG/ML IJ SOLN: 0.6250 mg | INTRAMUSCULAR | Status: DC | PRN

## 2012-08-15 MED ORDER — DIPHENHYDRAMINE HCL 50 MG/ML IJ SOLN
INTRAMUSCULAR | Status: AC
  Filled 2012-08-15: qty 1

## 2012-08-15 MED ORDER — FENTANYL CITRATE 0.05 MG/ML IJ SOLN
INTRAMUSCULAR | Status: AC
  Filled 2012-08-15: qty 2

## 2012-08-15 MED ORDER — MIDAZOLAM HCL 5 MG/ML IJ SOLN
INTRAMUSCULAR | Status: AC
  Filled 2012-08-15: qty 2

## 2012-08-15 MED ORDER — ONDANSETRON HCL 4 MG/2ML IJ SOLN
INTRAMUSCULAR | Status: DC | PRN
  Administered 2012-08-15 (×2): 4 mg via INTRAVENOUS

## 2012-08-15 MED ORDER — MORPHINE SULFATE 2 MG/ML IJ SOLN
1.0000 mg | INTRAMUSCULAR | Status: DC | PRN
  Administered 2012-08-15 – 2012-08-16 (×2): 1 mg via INTRAVENOUS
  Filled 2012-08-15 (×2): qty 1

## 2012-08-15 MED ORDER — BUTAMBEN-TETRACAINE-BENZOCAINE 2-2-14 % EX AERO
INHALATION_SPRAY | CUTANEOUS | Status: DC | PRN
  Administered 2012-08-15: 1 via TOPICAL

## 2012-08-15 MED ORDER — 0.9 % SODIUM CHLORIDE (POUR BTL) OPTIME
TOPICAL | Status: DC | PRN
  Administered 2012-08-15: 1000 mL

## 2012-08-15 MED ORDER — DEXTROSE-NACL 5-0.9 % IV SOLN
INTRAVENOUS | Status: DC
  Administered 2012-08-15 – 2012-08-16 (×2): 1000 mL via INTRAVENOUS
  Administered 2012-08-17 – 2012-08-18 (×3): via INTRAVENOUS

## 2012-08-15 MED ORDER — MIDAZOLAM HCL 10 MG/2ML IJ SOLN
INTRAMUSCULAR | Status: DC | PRN
  Administered 2012-08-15: 1 mg via INTRAVENOUS
  Administered 2012-08-15 (×2): 2 mg via INTRAVENOUS

## 2012-08-15 MED ORDER — FENTANYL CITRATE 0.05 MG/ML IJ SOLN: 25.0000 ug | INTRAMUSCULAR | Status: DC | PRN

## 2012-08-15 SURGICAL SUPPLY — 23 items
CANISTER SUCTION 2500CC (MISCELLANEOUS) ×3 IMPLANT
CLOTH BEACON ORANGE TIMEOUT ST (SAFETY) ×3 IMPLANT
CONT SPEC 4OZ CLIKSEAL STRL BL (MISCELLANEOUS) IMPLANT
COVER TABLE BACK 60X90 (DRAPES) ×3 IMPLANT
DRAPE PROXIMA HALF (DRAPES) ×3 IMPLANT
GLOVE BIO SURGEON STRL SZ7.5 (GLOVE) ×3 IMPLANT
GLOVE BIOGEL PI IND STRL 7.0 (GLOVE) IMPLANT
GLOVE BIOGEL PI INDICATOR 7.0 (GLOVE) ×1
GLOVE SKINSENSE NS SZ7.0 (GLOVE) ×1
GLOVE SKINSENSE STRL SZ7.0 (GLOVE) IMPLANT
GOWN STRL NON-REIN LRG LVL3 (GOWN DISPOSABLE) ×6 IMPLANT
KIT ROOM TURNOVER OR (KITS) ×3 IMPLANT
MARKER SKIN DUAL TIP RULER LAB (MISCELLANEOUS) IMPLANT
NS IRRIG 1000ML POUR BTL (IV SOLUTION) ×3 IMPLANT
PAD ARMBOARD 7.5X6 YLW CONV (MISCELLANEOUS) ×6 IMPLANT
PATTIES SURGICAL .5 X1 (DISPOSABLE) IMPLANT
SOLUTION ANTI FOG 6CC (MISCELLANEOUS) ×1 IMPLANT
SPONGE GAUZE 4X4 12PLY (GAUZE/BANDAGES/DRESSINGS) ×3 IMPLANT
SURGILUBE 2OZ TUBE FLIPTOP (MISCELLANEOUS) ×3 IMPLANT
SYR BULB IRRIGATION 50ML (SYRINGE) ×1 IMPLANT
TOWEL OR 17X24 6PK STRL BLUE (TOWEL DISPOSABLE) ×6 IMPLANT
TUBE CONNECTING 12X1/4 (SUCTIONS) ×4 IMPLANT
WATER STERILE IRR 1000ML POUR (IV SOLUTION) ×3 IMPLANT

## 2012-08-15 NOTE — OR Nursing (Signed)
Called into OR during Dr. Avel Sensor case to assist Dr. Ewing Schlein with food impaction.  When completed, OR RNs and CRNA are taking pt to PACU for recovery.  Endo team left bedside while in room. Ennis Forts, RN

## 2012-08-15 NOTE — Anesthesia Postprocedure Evaluation (Signed)
  Anesthesia Post-op Note  Patient: Rachel Cooke  Procedure(s) Performed: Procedure(s): ESOPHAGOSCOPY (N/A) REMOVAL FOREIGN BODY ESOPHAGEAL (N/A) ESOPHAGOGASTRODUODENOSCOPY (EGD) (N/A)  Patient Location: PACU  Anesthesia Type:General  Level of Consciousness: awake  Airway and Oxygen Therapy: Patient Spontanous Breathing and Patient connected to nasal cannula oxygen  Post-op Pain: none  Post-op Assessment: Post-op Vital signs reviewed, Patient's Cardiovascular Status Stable, Respiratory Function Stable, Patent Airway and No signs of Nausea or vomiting  Post-op Vital Signs: Reviewed and stable  Complications: No apparent anesthesia complications

## 2012-08-15 NOTE — Consult Note (Signed)
Reason for Consult: Probable food impact for 2 days Referring Physician: Hospital team and Dr. Frederik Pear  Rachel Cooke is an 79 y.o. female.  HPI: Patient seen at the request of both the hospital team and her [facility] doctors for probable food impaction that happened 2 days ago. She had a stroke 2 months ago and has been improving nicely but while eating a meal 2 days ago had acute onset of inability to swallow and yesterday she was getting IVs and today she presented to radiology and was found to have a impaction on barium swallow and she's had colonoscopies before by my partner and no previous endoscopy or swallowing problems prior to this or the stroke and she has no other complaint Past Medical History  Diagnosis Date  . Cancer   . Thyroid disease   . Hypertension   . High cholesterol   . Dendritic keratitis 2012  . Malignant neoplasm of breast (female), unspecified site 1970    S/P Rt radical mastectomy  . Unspecified hypothyroidism 2009  . Type II or unspecified type diabetes mellitus without mention of complication, not stated as uncontrolled 2003  . Unspecified vitamin D deficiency 2009  . Pure hyperglyceridemia 2004  . Anxiety state, unspecified   . Macular degeneration (senile) of retina, unspecified 2012  . Unspecified glaucoma(365.9) 2013  . Tinnitus 2007  . Unspecified essential hypertension 2000  . Coronary atherosclerosis of native coronary artery 2003    non obstructive by Cath 2003  . Atrioventricular block, complete 2003    s/p PPM 2003, generator chnage 2011  . Allergic rhinitis due to pollen   . Acquired cyst of kidney 2002  . Osteoarthrosis, unspecified whether generalized or localized, unspecified site 2013  . Unspecified arthropathy, pelvic region and thigh 2010  . Senile osteoporosis 2003  . Abnormality of gait 2007  . Edema 2003  . Personal history of colonic polyps      s/p colonoscopy/polypectomy 2003  . Cardiac pacemaker in situ 2003  . CVA  (cerebral infarction) 06/28/2012    rt hemiparesis, dysphagia  . NSTEMI (non-ST elevated myocardial infarction) 06/28/2012  . Urinary retention 07/07/2012    Past Surgical History  Procedure Laterality Date  . Mastectomy Right 1970    Cancer    No family history on file.  Social History:  reports that she has never smoked. She does not have any smokeless tobacco history on file. She reports that she does not drink alcohol. Her drug history is not on file.  Allergies: No Known Allergies  Medications: I have reviewed the patient's current medications.  No results found for this or any previous visit (from the past 48 hour(s)).  Dg Swallowing Func-speech Pathology  08/15/2012   Breck Coons Sel[place], CCC-SLP     08/15/2012 12:52 PM Objective Swallowing Evaluation: Modified Barium Swallowing Study   Patient Details  Name: Rachel Cooke MRN: [id number] Date of Birth: [birth year]  Today's Date: 08/15/2012 Time: 1100-     Past Medical History:  Past Medical History  Diagnosis Date  . Cancer   . Thyroid disease   . Hypertension   . High cholesterol   . Dendritic keratitis 2012  . Malignant neoplasm of breast (female), unspecified site 1970    S/P Rt radical mastectomy  . Unspecified hypothyroidism 2009  . Type II or unspecified type diabetes mellitus without mention  of complication, not stated as uncontrolled 2003  . Unspecified vitamin D deficiency 2009  . Pure hyperglyceridemia 2004  . Anxiety  state, unspecified   . Macular degeneration (senile) of retina, unspecified 2012  . Unspecified glaucoma(365.9) 2013  . Tinnitus 2007  . Unspecified essential hypertension 2000  . Coronary atherosclerosis of native coronary artery 2003    non obstructive by Cath 2003  . Atrioventricular block, complete 2003    s/p PPM 2003, generator chnage 2011  . Allergic rhinitis due to pollen   . Acquired cyst of kidney 2002  . Osteoarthrosis, unspecified whether generalized or localized,  unspecified site 2013  . Unspecified  arthropathy, pelvic region and thigh 2010  . Senile osteoporosis 2003  . Abnormality of gait 2007  . Edema 2003  . Personal history of colonic polyps      s/p colonoscopy/polypectomy 2003  . Cardiac pacemaker in situ 2003  . CVA (cerebral infarction) 06/28/2012    rt hemiparesis, dysphagia  . NSTEMI (non-ST elevated myocardial infarction) 06/28/2012  . Urinary retention 07/07/2012   Past Surgical History:  Past Surgical History  Procedure Laterality Date  . Mastectomy Right 1970    Cancer   HPI:  79 yr old from [facility] accompanied by son, caregiver and SLP.   PMH of CVA 06/2012, hypertension, dyslipidemia, status post  permanent pacemaker placement.  In 06/2012 pt. was discharged from  hospital on modified diet texture and nectar thick liquids.  Pt.  received ST and was able to upgrade to regular texture diet and  thin liquids.  Two days ago pt. began to have acute swallowing  difficulties with regurgition and significant pharyngeal globus  sensation and has been unable to eat/ddrink x 2 days.         Assessment / Plan / Recommendation Clinical Impression  Dysphagia Diagnosis: Suspected primary esophageal  dysphagia;Severe cervical esophageal phase dysphagia Clinical impression: MBS completed with pt. initially consuming  sip of thin barium resulting in severe stasis in  cervical/proximal esophagus with significant and forceful  backflow into pharynx , laryngeal vestibule and belching.  Pt.  performed reflexive multiple swallows in attempts to clear  residual that was ineffective.  Radiologist was called into xray  suite, viewed esophagus during swallows of thin barium and   suspects pt. has an acutely impacted esophagus and recommended  either CT or EGD.  This SLP spoke with Dr. Chilton Si who wants pt.  admitted to hospital today.   SLP also spoke with Jani Gravel, NP  who stated she would call hospital and have pt. directly  admitted.  This SLP recommends, GI consult to intervene with this  acute severe cervical and  suspected esophageal dysphagia.        Treatment Recommendation   (Pt. directly admitted from hospital, will need new order)    Diet Recommendation NPO        Other  Recommendations Recommended Consults: Consider GI  evaluation   Follow Up Recommendations       Frequency and Duration        Pertinent Vitals/Pain none    SLP Swallow Goals     General HPI: 79 yr old from [facility] accompanied by son,  caregiver and SLP.  PMH of CVA 06/2012, hypertension,  dyslipidemia, status post permanent pacemaker placement.  In  06/2012 pt. was discharged from hospital on modified diet texture  and nectar thick liquids.  Pt. received ST and was able to  upgrade to regular texture diet and thin liquids.  Two days ago  pt. began to have acute swallowing difficulties with regurgition  and significant pharyngeal globus sensation and has been unable  to eat/ddrink x 2 days.     Type of Study: Modified Barium Swallowing Study Reason for Referral: Objectively evaluate swallowing function Previous Swallow Assessment:  (no MBS) Diet Prior to this Study: Regular;Thin liquids Temperature Spikes Noted: No Respiratory Status: Room air History of Recent Intubation: No Behavior/Cognition: Alert;Cooperative;Pleasant mood Oral Cavity - Dentition: Dentures, top;Dentures, bottom Self-Feeding Abilities: Able to feed self Patient Positioning: Upright in chair Baseline Vocal Quality: Clear Volitional Cough: Strong Volitional Swallow: Able to elicit Anatomy: Within functional limits Pharyngeal Secretions: Not observed secondary MBS    Reason for Referral Objectively evaluate swallowing function   Oral Phase Oral Preparation/Oral Phase Oral Phase: WFL   Pharyngeal Phase Pharyngeal Phase Pharyngeal Phase: Within functional limits  Cervical Esophageal Phase    GO    Cervical Esophageal Phase Cervical Esophageal Phase: Impaired Cervical Esophageal Phase - Comment Cervical Esophageal Comment:  (significant stasis in proximal  esophagus)         Royce Macadamia 08/15/2012, 12:52 PM     ROS negative except above Blood pressure 160/99, temperature 97.8 F (36.6 C), temperature source Oral, resp. rate 16, SpO2 94.00%. Physical Exam vital signs stable afebrile no acute distress exam please see pre-assessment evaluation barium swallow reviewed  Assessment/Plan: Food impaction Plan: The risks benefits methods of endoscopy and removing food was discussed with both the patient and her family and will proceed ASAP with further workup and plans pending those findings  Joely Losier E 08/15/2012, 1:31 PM

## 2012-08-15 NOTE — Transfer of Care (Signed)
Immediate Anesthesia Transfer of Care Note  Patient: Rachel Cooke  Procedure(s) Performed: Procedure(s): ESOPHAGOSCOPY (N/A) REMOVAL FOREIGN BODY ESOPHAGEAL (N/A) ESOPHAGOGASTRODUODENOSCOPY (EGD) (N/A)  Patient Location: PACU  Anesthesia Type:General  Level of Consciousness: responds to stimulation  Airway & Oxygen Therapy: Patient Spontanous Breathing and Patient connected to face mask oxygen  Post-op Assessment: Report given to PACU RN, Post -op Vital signs reviewed and stable and Patient moving all extremities  Post vital signs: Reviewed and stable  Complications: No apparent anesthesia complications

## 2012-08-15 NOTE — Addendum Note (Signed)
Addended byVida Rigger on: 08/15/2012 01:30 PM   Modules accepted: Orders

## 2012-08-15 NOTE — Preoperative (Signed)
Beta Blockers   Reason not to administer Beta Blockers:Not Applicable 

## 2012-08-15 NOTE — Procedures (Signed)
Objective Swallowing Evaluation: Modified Barium Swallowing Study  Patient Details  Name: ADDELYNN BATTE MRN: [id number] Date of Birth: 12/21/1919  Today's Date: 08/15/2012 Time: 1100-     Past Medical History:  Past Medical History  Diagnosis Date  . Cancer   . Thyroid disease   . Hypertension   . High cholesterol   . Dendritic keratitis 2012  . Malignant neoplasm of breast (female), unspecified site 1970    S/P Rt radical mastectomy  . Unspecified hypothyroidism 2009  . Type II or unspecified type diabetes mellitus without mention of complication, not stated as uncontrolled 2003  . Unspecified vitamin D deficiency 2009  . Pure hyperglyceridemia 2004  . Anxiety state, unspecified   . Macular degeneration (senile) of retina, unspecified 2012  . Unspecified glaucoma(365.9) 2013  . Tinnitus 2007  . Unspecified essential hypertension 2000  . Coronary atherosclerosis of native coronary artery 2003    non obstructive by Cath 2003  . Atrioventricular block, complete 2003    s/p PPM 2003, generator chnage 2011  . Allergic rhinitis due to pollen   . Acquired cyst of kidney 2002  . Osteoarthrosis, unspecified whether generalized or localized, unspecified site 2013  . Unspecified arthropathy, pelvic region and thigh 2010  . Senile osteoporosis 2003  . Abnormality of gait 2007  . Edema 2003  . Personal history of colonic polyps      s/p colonoscopy/polypectomy 2003  . Cardiac pacemaker in situ 2003  . CVA (cerebral infarction) 06/28/2012    rt hemiparesis, dysphagia  . NSTEMI (non-ST elevated myocardial infarction) 06/28/2012  . Urinary retention 07/07/2012   Past Surgical History:  Past Surgical History  Procedure Laterality Date  . Mastectomy Right 1970    Cancer   HPI:  79 yr old from [facility] accompanied by son, caregiver and SLP.  PMH of CVA 06/2012, hypertension, dyslipidemia, status post permanent pacemaker placement.  In 06/2012 pt. was discharged from hospital on  modified diet texture and nectar thick liquids.  Pt. received ST and was able to upgrade to regular texture diet and thin liquids.  Two days ago pt. began to have acute swallowing difficulties with regurgition and significant pharyngeal globus sensation and has been unable to eat/ddrink x 2 days.         Assessment / Plan / Recommendation Clinical Impression  Dysphagia Diagnosis: Suspected primary esophageal dysphagia;Severe cervical esophageal phase dysphagia Clinical impression: MBS completed with pt. initially consuming sip of thin barium resulting in severe stasis in cervical/proximal esophagus with significant and forceful backflow into pharynx , laryngeal vestibule and belching.  Pt. performed reflexive multiple swallows in attempts to clear residual that was ineffective.  Radiologist was called into xray suite, viewed esophagus during swallows of thin barium and  suspects pt. has an acutely impacted esophagus and recommended either CT or EGD.  This SLP spoke with Dr. Chilton Si who wants pt. admitted to hospital today.   SLP also spoke with Jani Gravel, NP who stated she would call hospital and have pt. directly admitted.  This SLP recommends, GI consult to intervene with this acute severe cervical and suspected esophageal dysphagia.        Treatment Recommendation   (Pt. directly admitted from hospital, will need new order)    Diet Recommendation NPO        Other  Recommendations Recommended Consults: Consider GI evaluation   Follow Up Recommendations       Frequency and Duration        Pertinent Vitals/Pain none  SLP Swallow Goals     General HPI: 79 yr old from [facility] accompanied by son, caregiver and SLP.  PMH of CVA 06/2012, hypertension, dyslipidemia, status post permanent pacemaker placement.  In 06/2012 pt. was discharged from hospital on modified diet texture and nectar thick liquids.  Pt. received ST and was able to upgrade to regular texture diet and thin liquids.  Two  days ago pt. began to have acute swallowing difficulties with regurgition and significant pharyngeal globus sensation and has been unable to eat/ddrink x 2 days.     Type of Study: Modified Barium Swallowing Study Reason for Referral: Objectively evaluate swallowing function Previous Swallow Assessment:  (no MBS) Diet Prior to this Study: Regular;Thin liquids Temperature Spikes Noted: No Respiratory Status: Room air History of Recent Intubation: No Behavior/Cognition: Alert;Cooperative;Pleasant mood Oral Cavity - Dentition: Dentures, top;Dentures, bottom Self-Feeding Abilities: Able to feed self Patient Positioning: Upright in chair Baseline Vocal Quality: Clear Volitional Cough: Strong Volitional Swallow: Able to elicit Anatomy: Within functional limits Pharyngeal Secretions: Not observed secondary MBS    Reason for Referral Objectively evaluate swallowing function   Oral Phase Oral Preparation/Oral Phase Oral Phase: WFL   Pharyngeal Phase Pharyngeal Phase Pharyngeal Phase: Within functional limits  Cervical Esophageal Phase    GO    Cervical Esophageal Phase Cervical Esophageal Phase: Impaired Cervical Esophageal Phase - Comment Cervical Esophageal Comment:  (significant stasis in proximal esophagus)         Royce Macadamia 08/15/2012, 12:52 PM

## 2012-08-15 NOTE — H&P (Signed)
Reason for Consult: Esophageal foreign body/ food impaction Referring Physician: Vida Rigger, MD  HPI:  Rachel Cooke is an 79 y.o. female with 2 day history of dysphagia and inability to swallow.  She had a stroke 2 months ago and has been improving nicely but while eating a meal 2 days ago had acute onset of inability to swallow. Today she presented to radiology and was found to have a impaction on barium swallow.  On her upper GI exam, Dr. Ewing Cooke noted severe Barium and food impaction of her esophagus.  Attempts to remove the impaction was unsuccesful.  ENT is therefore consulted for rigid esophagoscopy and disimpaction.  Past Medical History  Diagnosis Date  . Cancer   . Thyroid disease   . Hypertension   . High cholesterol   . Dendritic keratitis 2012  . Malignant neoplasm of breast (female), unspecified site 1970    S/P Rt radical mastectomy  . Unspecified hypothyroidism 2009  . Type II or unspecified type diabetes mellitus without mention of complication, not stated as uncontrolled 2003  . Unspecified vitamin D deficiency 2009  . Pure hyperglyceridemia 2004  . Anxiety state, unspecified   . Macular degeneration (senile) of retina, unspecified 2012  . Unspecified glaucoma(365.9) 2013  . Tinnitus 2007  . Unspecified essential hypertension 2000  . Coronary atherosclerosis of native coronary artery 2003    non obstructive by Cath 2003  . Atrioventricular block, complete 2003    s/p PPM 2003, generator chnage 2011  . Allergic rhinitis due to pollen   . Acquired cyst of kidney 2002  . Osteoarthrosis, unspecified whether generalized or localized, unspecified site 2013  . Unspecified arthropathy, pelvic region and thigh 2010  . Senile osteoporosis 2003  . Abnormality of gait 2007  . Edema 2003  . Personal history of colonic polyps      s/p colonoscopy/polypectomy 2003  . Cardiac pacemaker in situ 2003  . CVA (cerebral infarction) 06/28/2012    rt hemiparesis, dysphagia  .  NSTEMI (non-ST elevated myocardial infarction) 06/28/2012  . Urinary retention 07/07/2012  . Pacemaker   . Stroke 06/28/2012    Past Surgical History  Procedure Laterality Date  . Mastectomy Right 1970    Cancer  . Insert / replace / remove pacemaker      History reviewed. No pertinent family history.  Social History:  reports that she has never smoked. She does not have any smokeless tobacco history on file. She reports that she does not drink alcohol. Her drug history is not on file.  Allergies: No Known Allergies  Medications:  I have reviewed the patient's current medications. Prior to Admission:  Prescriptions prior to admission  Medication Sig Dispense Refill  . acetaminophen (TYLENOL) 650 MG CR tablet Take 650 mg by mouth 2 (two) times daily.      Marland Kitchen antiseptic oral rinse (BIOTENE) LIQD 15 mLs by Mouth Rinse route as directed. Before meals and at bedtime      . beta carotene w/minerals (OCUVITE) tablet Take 2 tablets by mouth daily. Take 2 tablets daily for beta-carotene with minerals      . cetirizine (ZYRTEC) 10 MG tablet Take 10 mg by mouth daily as needed for allergies.      Marland Kitchen clopidogrel (PLAVIX) 75 MG tablet Take 1 tablet (75 mg total) by mouth daily with breakfast.  30 tablet  0  . escitalopram (LEXAPRO) 20 MG tablet Take 20 mg by mouth daily.      Marland Kitchen levothyroxine (SYNTHROID, LEVOTHROID) 75  MCG tablet Take 75 mcg by mouth daily before breakfast.      . losartan (COZAAR) 50 MG tablet Take 1 tablet (50 mg total) by mouth daily.      . potassium chloride SA (K-DUR,KLOR-CON) 20 MEQ tablet 1 daily for potassium supplement. May crush and sprinkle on food.  30 tablet  3    No results found for this or any previous visit (from the past 48 hour(s)).  Dg Swallowing Func-speech Pathology  08/15/2012   Rachel Cooke Rachel Cooke, CCC-SLP     08/15/2012 12:52 PM Objective Swallowing Evaluation: Modified Barium Swallowing Study   Patient Details  Name: Rachel Cooke MRN: [id number] Date of  Birth: 1919/03/27  Today's Date: 08/15/2012 Time: 1100-     Past Medical History:  Past Medical History  Diagnosis Date  . Cancer   . Thyroid disease   . Hypertension   . High cholesterol   . Dendritic keratitis 2012  . Malignant neoplasm of breast (female), unspecified site 1970    S/P Rt radical mastectomy  . Unspecified hypothyroidism 2009  . Type II or unspecified type diabetes mellitus without mention  of complication, not stated as uncontrolled 2003  . Unspecified vitamin D deficiency 2009  . Pure hyperglyceridemia 2004  . Anxiety state, unspecified   . Macular degeneration (senile) of retina, unspecified 2012  . Unspecified glaucoma(365.9) 2013  . Tinnitus 2007  . Unspecified essential hypertension 2000  . Coronary atherosclerosis of native coronary artery 2003    non obstructive by Cath 2003  . Atrioventricular block, complete 2003    s/p PPM 2003, generator chnage 2011  . Allergic rhinitis due to pollen   . Acquired cyst of kidney 2002  . Osteoarthrosis, unspecified whether generalized or localized,  unspecified site 2013  . Unspecified arthropathy, pelvic region and thigh 2010  . Senile osteoporosis 2003  . Abnormality of gait 2007  . Edema 2003  . Personal history of colonic polyps      s/p colonoscopy/polypectomy 2003  . Cardiac pacemaker in situ 2003  . CVA (cerebral infarction) 06/28/2012    rt hemiparesis, dysphagia  . NSTEMI (non-ST elevated myocardial infarction) 06/28/2012  . Urinary retention 07/07/2012   Past Surgical History:  Past Surgical History  Procedure Laterality Date  . Mastectomy Right 1970    Cancer   HPI:  79 yr old from [facility] accompanied by son, caregiver and SLP.   PMH of CVA 06/2012, hypertension, dyslipidemia, status post  permanent pacemaker placement.  In 06/2012 pt. was discharged from  hospital on modified diet texture and nectar thick liquids.  Pt.  received ST and was able to upgrade to regular texture diet and  thin liquids.  Two days ago pt. began to have acute swallowing   difficulties with regurgition and significant pharyngeal globus  sensation and has been unable to eat/ddrink x 2 days.         Assessment / Plan / Recommendation Clinical Impression  Dysphagia Diagnosis: Suspected primary esophageal  dysphagia;Severe cervical esophageal phase dysphagia Clinical impression: MBS completed with pt. initially consuming  sip of thin barium resulting in severe stasis in  cervical/proximal esophagus with significant and forceful  backflow into pharynx , laryngeal vestibule and belching.  Pt.  performed reflexive multiple swallows in attempts to clear  residual that was ineffective.  Radiologist was called into xray  suite, viewed esophagus during swallows of thin barium and   suspects pt. has an acutely impacted esophagus and recommended  either CT or EGD.  This SLP spoke with Dr. Chilton Si who wants pt.  admitted to hospital today.   SLP also spoke with Jani Gravel, NP  who stated she would call hospital and have pt. directly  admitted.  This SLP recommends, GI consult to intervene with this  acute severe cervical and suspected esophageal dysphagia.        Treatment Recommendation   (Pt. directly admitted from hospital, will need new order)    Diet Recommendation NPO        Other  Recommendations Recommended Consults: Consider GI  evaluation   Follow Up Recommendations       Frequency and Duration        Pertinent Vitals/Pain none    SLP Swallow Goals     General HPI: 79 yr old from [facility] accompanied by son,  caregiver and SLP.  PMH of CVA 06/2012, hypertension,  dyslipidemia, status post permanent pacemaker placement.  In  06/2012 pt. was discharged from hospital on modified diet texture  and nectar thick liquids.  Pt. received ST and was able to  upgrade to regular texture diet and thin liquids.  Two days ago  pt. began to have acute swallowing difficulties with regurgition  and significant pharyngeal globus sensation and has been unable  to eat/ddrink x 2 days.     Type of Study: Modified  Barium Swallowing Study Reason for Referral: Objectively evaluate swallowing function Previous Swallow Assessment:  (no MBS) Diet Prior to this Study: Regular;Thin liquids Temperature Spikes Noted: No Respiratory Status: Room air History of Recent Intubation: No Behavior/Cognition: Alert;Cooperative;Pleasant mood Oral Cavity - Dentition: Dentures, top;Dentures, bottom Self-Feeding Abilities: Able to feed self Patient Positioning: Upright in chair Baseline Vocal Quality: Clear Volitional Cough: Strong Volitional Swallow: Able to elicit Anatomy: Within functional limits Pharyngeal Secretions: Not observed secondary MBS    Reason for Referral Objectively evaluate swallowing function   Oral Phase Oral Preparation/Oral Phase Oral Phase: WFL   Pharyngeal Phase Pharyngeal Phase Pharyngeal Phase: Within functional limits  Cervical Esophageal Phase    GO    Cervical Esophageal Phase Cervical Esophageal Phase: Impaired Cervical Esophageal Phase - Comment Cervical Esophageal Comment:  (significant stasis in proximal  esophagus)         Royce Macadamia 08/15/2012, 12:52 PM    ROS negative except above   Blood pressure 164/93, temperature 98.1 F (36.7 C), temperature source Oral, resp. rate 19, SpO2 92.00%. General appearance: alert, cooperative and no distress ENT: Her pupils are equal, round, reactive to light. Extraocular motion is intact. Examination of the ears shows normal auricles and external auditory canals bilaterally. Both tympanic membranes are intact.  Nasal examination shows normal mucosa, septum, turbinates. Facial examination shows no asymmetry. Palpation of the face elicit no significant tenderness. Oral cavity examination shows no mucosal lacerations. No significant trismus is noted. Palpation of the neck reveals no lymphadenopathy or mass. The trachea is midline. The thyroid is not significantly enlarged. Cranial nerves 2-12 are all grossly in tact.  Assessment/Plan: Esophageal food and foreign  body impaction.  Plan rigid esophagoscopy with foreign body removal in the OR under general anesthesia.  R/B/A discussed with patient and daughter. Informed consent obtained.   Aloha Bartok,SUI W 08/15/2012, 3:33 PM

## 2012-08-15 NOTE — Brief Op Note (Signed)
08/15/2012  7:14 PM  PATIENT:  Rachel Cooke  79 y.o. female  PRE-OPERATIVE DIAGNOSIS:  Esophageal food impaction  POST-OPERATIVE DIAGNOSIS:  Esophageal food impaction  PROCEDURE:  Procedure(s): Rigid esophagoscopy with removal of impacted food (Dr. Janeece Riggers Darrold Span Analeise Mccleery) ESOPHAGOGASTRODUODENOSCOPY (EGD) (Dr. Vida Rigger)  SURGEON:  Surgeon(s) and Role: Panel 1:    * Darletta Moll, MD - Primary  Panel 2:    * Petra Kuba, MD - Primary  PHYSICIAN ASSISTANT:   ASSISTANTS: none   ANESTHESIA:   general  EBL:     BLOOD ADMINISTERED:none  DRAINS: none   LOCAL MEDICATIONS USED:  NONE  SPECIMEN:  No Specimen  DISPOSITION OF SPECIMEN:  N/A  COUNTS:  YES  TOURNIQUET:  * No tourniquets in log *  DICTATION: .Other Dictation: Dictation Number [id number]  PLAN OF CARE: Admit for overnight observation  PATIENT DISPOSITION:  PACU - hemodynamically stable.   Delay start of Pharmacological VTE agent (>24hrs) due to surgical blood loss or risk of bleeding: not applicable

## 2012-08-15 NOTE — Anesthesia Preprocedure Evaluation (Addendum)
Anesthesia Evaluation  Patient identified by MRN, date of birth, ID band Patient awake    Reviewed: Allergy & Precautions, H&P , NPO status , Patient's Chart, lab work & pertinent test results  History of Anesthesia Complications Negative for: history of anesthetic complications  Airway Mallampati: II TM Distance: >3 FB Neck ROM: Full    Dental  (+) Edentulous Upper, Partial Lower and Dental Advisory Given   Pulmonary former smoker,  breath sounds clear to auscultation  Pulmonary exam normal       Cardiovascular hypertension, Pt. on medications + CAD (non-obstructive by cath '03) and + Past MI (? non-STEMI ) + pacemaker (third degree AVB ) Rhythm:Regular Rate:Normal  4/14 ECHO: normal LVF, EF 60-65%, valves OK   Neuro/Psych CVA (R hemiparesis, swallowing dysfunction), Residual Symptoms negative psych ROS   GI/Hepatic Neg liver ROS, Esophageal impaction   Endo/Other  diabetes (glu 124), Well Controlled, Type 2, Oral Hypoglycemic AgentsHypothyroidism   Renal/GU negative Renal ROS     Musculoskeletal   Abdominal (+) + obese,   Peds  Hematology   Anesthesia Other Findings   Reproductive/Obstetrics                          Anesthesia Physical Anesthesia Plan  ASA: III  Anesthesia Plan: General   Post-op Pain Management:    Induction: Intravenous  Airway Management Planned: Oral ETT  Additional Equipment:   Intra-op Plan:   Post-operative Plan: Extubation in OR  Informed Consent: I have reviewed the patients History and Physical, chart, labs and discussed the procedure including the risks, benefits and alternatives for the proposed anesthesia with the patient or authorized representative who has indicated his/her understanding and acceptance.   Dental advisory given  Plan Discussed with: Surgeon and CRNA  Anesthesia Plan Comments: (Plan routine monitors, GETA )         Anesthesia Quick Evaluation

## 2012-08-15 NOTE — Op Note (Addendum)
[facility] [address] [place], [address]   ENDOSCOPY PROCEDURE REPORT  PATIENT: Rachel Cooke, Rachel Cooke  MR#: [id number] BIRTHDATE: 1919/05/15 , 89  yrs. old GENDER: Female  ENDOSCOPIST: Vida Rigger, MD REFERRED XB:JYNWGN Chilton Si, M.D.  PROCEDURE DATE:  08/15/2012 PROCEDURE:   Esophagoscopy and EGD w/ fb removal ASA CLASS:   Class III INDICATIONS:Foreign body removal.  MEDICATIONS: Fentanyl 37.5 mcg IV and Versed 5 mg IV  TOPICAL ANESTHETIC:  DESCRIPTION OF PROCEDURE:   After the risks benefits and alternatives of the procedure were thoroughly explained, informed consent was obtained.  The Pentax Gastroscope [id number]  endoscope was introduced through the mouth and advanced to the esophagus upper , limited by Without limitations.food and barium was seenthroughout the esophagus and we were unable to advance the scope past the food and barium and we advanced the Lucina Mellow net twice and removed 2 small pieces but it was clear that we would not be successful removing all the food in this manner based on the esophagus seemingly being full  The instrument was slowly withdrawn as the mucosa was fully examined.the patient tolerated the procedure well there was no obvious immediate complication         FINDINGS:esophagus almost completely full with debris food and barium status post Lucina Mellow net removal of small bits x2  COMPLICATIONS:none  ENDOSCOPIC IMPRESSION:above   RECOMMENDATIONS:I believe the patient will need intubation and rigid esophagoscopy to clear the esophagus and I discussed the case with Dr. Cornelius Moras of [facility] who will be available to help but has recommended [id number] ENT and I discussed the case with Dr. Azucena Freed who is willing to proceed and will let me know if I can be of any further assistance  REPEAT EXAM: as needed   _______________________________ Vida Rigger, MD eSigned:  Vida Rigger, MD 08/15/2012 3:03 PM    HQ:IONGEX Chilton Si,  MD

## 2012-08-15 NOTE — Op Note (Signed)
[facility] [address] [place], [address]   ENDOSCOPY PROCEDURE REPORT  PATIENT: Rachel, Cooke  MR#: [id number] BIRTHDATE: February 12, 1920 , 89  yrs. old GENDER: Female  ENDOSCOPIST: Vida Rigger, MD REFERRED BY:  PROCEDURE DATE:  08/15/2012 PROCEDURE:   EGD w/ fb removal ASA CLASS:   Class III INDICATIONS:Foreign body removal.  called by ENT to clear distal esophagus after he had cleared the proximal and mid esophagus  MEDICATIONS: General endotracheal anesthesia (GETA)  TOPICAL ANESTHETIC:no  DESCRIPTION OF PROCEDURE:   After the risks benefits and alternatives of the procedure were thoroughly explained, informed consent was obtained.  The Pentax Gastroscope [id number]  endoscope was introduced through the mouth and advanced to the stomach body , limited by Without limitations. this was accomplishedafter multiple insertions and removal of small pieces of food in the distal esophagus after  multiple Roth nets and quadpod graspersThe instrument was slowly withdrawn as the mucosa was fully examined.once we got to the stomach we were having trouble grabbing or netting pieces of food we inserted the 18 mm dilation balloon into the stomach and gently and slowly withdrew it through the esophagus most of the time at a lower pressure to pull some of the food back into the upper esophagus and mid esophagus so that the ENT physician could retrieve it more readily and he went ahead and proceeded next and we reinserted our endoscope when he was through into more poor small pieces were removed and all the rest of the esophagus was washed into the stomach unfortunately a small piece of the balloon had fragmented in the scope and with advancing the 4 prong grabber it fell out in the stomach which we snared later and it was retrieved.throughout the entire procedure there was lots of esophageal spasm and At the end of the procedurethere seemed to be a small tear  in the distal esophagus with some fresh heme but there was no other complication or problem and the scope was removed and the procedure was terminated          FINDINGS:severe food impaction status post 4-5 hoursof ENT and endoscopic removal with increased esophageal spasm and questionable significant distal tear  COMPLICATIONS:tear distally as above questionable significance  ENDOSCOPIC IMPRESSION: above   RECOMMENDATIONS:admit to hospital team observe for delayed complications consider empiric antibiotics and in a.m. if doing well sips of clear liquids onlyand I would not advance her diet for at least a few days and would cover with pump inhibitors   REPEAT EXAM: as needed   _______________________________ Vida Rigger, MD eSigned:  Vida Rigger, MD 08/15/2012 7:37 PM    CC:  PATIENT NAME:  Rachel Cooke, Rachel Cooke MR#: [id number]

## 2012-08-15 NOTE — Anesthesia Procedure Notes (Signed)
Procedure Name: Intubation Date/Time: 08/15/2012 3:49 PM Performed by: Jerilee Hoh Pre-anesthesia Checklist: Patient identified, Emergency Drugs available, Suction available and Patient being monitored Patient Re-evaluated:Patient Re-evaluated prior to inductionOxygen Delivery Method: Circle system utilized Preoxygenation: Pre-oxygenation with 100% oxygen Intubation Type: IV induction, Rapid sequence and Cricoid Pressure applied Laryngoscope Size: Mac and 3 Grade View: Grade I Tube type: Oral Tube size: 7.0 mm Number of attempts: 1 Airway Equipment and Method: Stylet Placement Confirmation: ETT inserted through vocal cords under direct vision,  positive ETCO2 and breath sounds checked- equal and bilateral Secured at: 21 cm Tube secured with: Tape Dental Injury: Teeth and Oropharynx as per pre-operative assessment

## 2012-08-16 ENCOUNTER — Encounter (HOSPITAL_COMMUNITY): Payer: Self-pay | Admitting: *Deleted

## 2012-08-16 DIAGNOSIS — R1310 Dysphagia, unspecified: Secondary | ICD-10-CM | POA: Diagnosis not present

## 2012-08-16 DIAGNOSIS — T18108A Unspecified foreign body in esophagus causing other injury, initial encounter: Secondary | ICD-10-CM | POA: Diagnosis not present

## 2012-08-16 DIAGNOSIS — R633 Feeding difficulties: Secondary | ICD-10-CM | POA: Diagnosis not present

## 2012-08-16 DIAGNOSIS — R933 Abnormal findings on diagnostic imaging of other parts of digestive tract: Secondary | ICD-10-CM | POA: Diagnosis not present

## 2012-08-16 LAB — CBC WITH DIFFERENTIAL/PLATELET
Basophils Absolute: 0.1 10*3/uL (ref 0.0–0.1)
Basophils Relative: 1 % (ref 0–1)
Eosinophils Absolute: 0 10*3/uL (ref 0.0–0.7)
Hemoglobin: 13.3 g/dL (ref 12.0–15.0)
MCH: 31.2 pg (ref 26.0–34.0)
MCHC: 34.1 g/dL (ref 30.0–36.0)
Monocytes Relative: 8 % (ref 3–12)
Neutro Abs: 11.5 10*3/uL — ABNORMAL HIGH (ref 1.7–7.7)
Neutrophils Relative %: 78 % — ABNORMAL HIGH (ref 43–77)
Platelets: 184 10*3/uL (ref 150–400)
RDW: 13.6 % (ref 11.5–15.5)

## 2012-08-16 LAB — GLUCOSE, CAPILLARY
Glucose-Capillary: 125 mg/dL — ABNORMAL HIGH (ref 70–99)
Glucose-Capillary: 128 mg/dL — ABNORMAL HIGH (ref 70–99)

## 2012-08-16 LAB — TSH: TSH: 0.706 u[IU]/mL (ref 0.350–4.500)

## 2012-08-16 MED ORDER — MORPHINE SULFATE 2 MG/ML IJ SOLN
2.0000 mg | INTRAMUSCULAR | Status: DC | PRN
  Administered 2012-08-16 – 2012-08-17 (×6): 2 mg via INTRAVENOUS
  Filled 2012-08-16: qty 1
  Filled 2012-08-16: qty 2
  Filled 2012-08-16 (×4): qty 1

## 2012-08-16 MED ORDER — PHENOL 1.4 % MT LIQD
1.0000 | OROMUCOSAL | Status: DC | PRN
  Filled 2012-08-16: qty 177

## 2012-08-16 NOTE — Progress Notes (Signed)
[facility] Progress Note [place] TEAM 1 - Stepdown/ICU TEAM   Rachel Cooke WUJ:[id number] DOB: [birth year] DOA: 08/15/2012 PCP: Kimber Relic, MD  Brief narrative: 79 y.o. female with 2 day history of dysphagia and inability to swallow. She had a stroke 2 months previously and had been improving nicely but while eating a meal 2 days prior to admission had acute onset of inability to swallow. The day of admission she presented to radiology and was found to have an impaction on barium swallow. On her upper GI exam, Dr. Ewing Schlein noted severe barium and food impaction of her esophagus. Attempts to remove the impaction were unsuccesful. ENT was therefore consulted for rigid esophagoscopy and disimpaction.  This was carried out in the OR under general anesthesia, with concern for possible esophageal tear during the procedure.  As a result, the pt was admitted by [facility] post-op for close monitoring.  Assessment/Plan:  Food impaction of esophagus As per GI  Hypothyroidism TSH at goal   Left parietal corona radiata/centrum semiovale infarct 06/28/2012 R hemiparesis + dysphagia - etiology likley small vessel disease - plavix daily prescribed (had been on ASA 81mg  at time of event)  Type II or unspecified type diabetes mellitus  CBG well controlled at present   Unspecified essential hypertension  Atrioventricular block, complete - s/p PPM 2003 (changed 2011)  Macular degeneration   Code Status: NO CODE Family Communication: spoke w/ pt and daughter at bedside  Disposition Plan: SDU  Consultants: ENT GI  Procedures: 6/13 - EGD - esophagus almost completely full with debris food and barium status post Lucina Mellow net removal of small bits x2 6/13 - rigid esophagoscopy with foreign body removal in the OR under general anesthesia x4-5 hrs w/ questionable significant distal tear  Antibiotics: Cefuroxime 6/13 >>  DVT prophylaxis: SCDs  HPI/Subjective: Pt seen in  f/u.  Objective: Blood pressure 110/57, pulse 94, temperature 98.6 F (37 C), temperature source Oral, resp. rate 16, height 5\' 6"  (1.676 m), weight 81.1 kg (178 lb 12.7 oz), SpO2 96.00%.  Intake/Output Summary (Last 24 hours) at 08/16/12 1527 Last data filed at 08/16/12 1400  Gross per 24 hour  Intake   1020 ml  Output    350 ml  Net    670 ml   Exam: F/U exam completed  Data Reviewed: Basic Metabolic Panel:  Recent Labs Lab 08/15/12 2054  NA 137  K 4.7  CL 102  CO2 21  GLUCOSE 124*  BUN 6  CREATININE 0.40*  CALCIUM 9.5   Liver Function Tests:  Recent Labs Lab 08/15/12 2054  AST 60*  ALT 28  ALKPHOS 49  BILITOT 0.5  PROT 7.2  ALBUMIN 3.6   CBC:  Recent Labs Lab 08/16/12 0050  WBC 14.7*  NEUTROABS 11.5*  HGB 13.3  HCT 39.0  MCV 91.5  PLT 184   CBG:  Recent Labs Lab 08/15/12 1539 08/15/12 1922  GLUCAP 124* 132*    Recent Results (from the past 240 hour(s))  MRSA PCR SCREENING     Status: None   Collection Time    08/16/12  3:14 AM      Result Value Range Status   MRSA by PCR NEGATIVE  NEGATIVE Final   Comment:            The GeneXpert MRSA Assay (FDA     approved for NASAL specimens     only), is one component of a     comprehensive MRSA colonization  surveillance program. It is not     intended to diagnose MRSA     infection nor to guide or     monitor treatment for     MRSA infections.     Studies:  Recent x-ray studies have been reviewed in detail by the Attending Physician  Scheduled Meds:  Scheduled Meds: . cefUROXime (ZINACEF)  IV  750 mg Intravenous Q8H  . heparin  5,000 Units Subcutaneous Q8H  . insulin aspart  0-9 Units Subcutaneous Q4H  . pantoprazole (PROTONIX) IV  40 mg Intravenous Q12H  . sodium chloride  3 mL Intravenous Q12H    Time spent on care of this patient: 25+mins   [facility] T  [facility] Office  [phone] Pager - Text Page per Loretha Stapler as per below:  On-Call/Text Page:       [email or web address]      password [email or web address]  If 7PM-7AM, please contact night-coverage [email or web address] Password [email or web address] 08/16/2012, 3:27 PM   LOS: 1 day

## 2012-08-16 NOTE — Progress Notes (Signed)
Ivar Drape 10:19 AM  Subjective: Other than the sore throat patient is doing well without chest pain and without abdominal complaints and is thirsty and would like something to drink  Objective: Vital signs stable afebrile no acute distress lungs are clear decreased heart sounds no chest wall tenderness or crepitus abdomen is soft good bowel sounds nontender slight increased white count otherwise labs are okay  Assessment: Status post extensive food impaction complicated with distal esophageal tear questionable significance at this time Plan: Will allow ice chips and sips of water and rediscussed her case with the son and will check back later today to see how she is doing and consider Gastrografin swallow although we discussed the risks of aspiration with that and that's why we are holding off currently  [facility] E

## 2012-08-16 NOTE — H&P (Signed)
[facility] History and Physical  Rachel Cooke ZOX:[id number] DOB: Apr 04, 1919    PCP:   Kimber Relic, MD   Chief Complaint: food impaction.  HPI: Rachel Cooke is an 79 y.o. female nursing home resident, with DNR code status, hx of CVA with known dysphagia, HTN, hyperlipidemia, DM2, found to have esophageal food impaction on Barium study.  Apparently her diet has been advancing and she was given uncrushed pills as well.  She was endoscope by Dr Cyndia Diver, but her impacted food wasn't able to be extracted.  ENT consulted with Dr Greer Pickerel, and rigid endoscopy was placed with eventual extraction of her impacted food.  It should be noted that there was a small bleeding at the distal esosphagus, querry small perforation.  Hospitalist was asked to admit her for further treatment.  Rewiew of Systems:   I am unable to obtain a meaningful ROS at this time.   Past Medical History  Diagnosis Date  . Cancer   . Thyroid disease   . Hypertension   . High cholesterol   . Dendritic keratitis 2012  . Malignant neoplasm of breast (female), unspecified site 1970    S/P Rt radical mastectomy  . Unspecified hypothyroidism 2009  . Type II or unspecified type diabetes mellitus without mention of complication, not stated as uncontrolled 2003  . Unspecified vitamin D deficiency 2009  . Pure hyperglyceridemia 2004  . Anxiety state, unspecified   . Macular degeneration (senile) of retina, unspecified 2012  . Unspecified glaucoma(365.9) 2013  . Tinnitus 2007  . Unspecified essential hypertension 2000  . Coronary atherosclerosis of native coronary artery 2003    non obstructive by Cath 2003  . Atrioventricular block, complete 2003    s/p PPM 2003, generator chnage 2011  . Allergic rhinitis due to pollen   . Acquired cyst of kidney 2002  . Osteoarthrosis, unspecified whether generalized or localized, unspecified site 2013  . Unspecified arthropathy, pelvic region and thigh 2010  . Senile  osteoporosis 2003  . Abnormality of gait 2007  . Edema 2003  . Personal history of colonic polyps      s/p colonoscopy/polypectomy 2003  . Cardiac pacemaker in situ 2003  . CVA (cerebral infarction) 06/28/2012    rt hemiparesis, dysphagia  . NSTEMI (non-ST elevated myocardial infarction) 06/28/2012  . Urinary retention 07/07/2012  . Pacemaker   . Stroke 06/28/2012    Past Surgical History  Procedure Laterality Date  . Mastectomy Right 1970    Cancer  . Insert / replace / remove pacemaker    . Esophagogastroduodenoscopy N/A 08/15/2012    Procedure: ESOPHAGOGASTRODUODENOSCOPY (EGD);  Surgeon: Petra Kuba, MD;  Location: [facility] ENDOSCOPY;  Service: Endoscopy;  Laterality: N/A;    Medications:  HOME MEDS: Prior to Admission medications   Medication Sig Start Date End Date Taking? Authorizing Provider  acetaminophen (TYLENOL) 650 MG CR tablet Take 650 mg by mouth 2 (two) times daily.   Yes Historical Provider, MD  antiseptic oral rinse (BIOTENE) LIQD 15 mLs by Mouth Rinse route as directed. Before meals and at bedtime   Yes Historical Provider, MD  beta carotene w/minerals (OCUVITE) tablet Take 2 tablets by mouth daily. Take 2 tablets daily for beta-carotene with minerals   Yes Historical Provider, MD  cetirizine (ZYRTEC) 10 MG tablet Take 10 mg by mouth daily as needed for allergies.   Yes Historical Provider, MD  clopidogrel (PLAVIX) 75 MG tablet Take 1 tablet (75 mg total) by mouth daily with breakfast. 07/03/12  Yes Penny Pia, MD  escitalopram (LEXAPRO) 20 MG tablet Take 20 mg by mouth daily.   Yes Historical Provider, MD  levothyroxine (SYNTHROID, LEVOTHROID) 75 MCG tablet Take 75 mcg by mouth daily before breakfast.   Yes Historical Provider, MD  losartan (COZAAR) 50 MG tablet Take 1 tablet (50 mg total) by mouth daily. 07/15/12  Yes Claudette Royston Sinner, NP  potassium chloride SA (K-DUR,KLOR-CON) 20 MEQ tablet 1 daily for potassium supplement. May crush and sprinkle on food. 07/07/12  Yes  Kimber Relic, MD     Allergies:  No Known Allergies  Social History:   reports that she has never smoked. She does not have any smokeless tobacco history on file. She reports that she does not drink alcohol. Her drug history is not on file.  Family History: History reviewed. No pertinent family history.   Physical Exam: Filed Vitals:   08/15/12 2105 08/15/12 2120 08/15/12 2130 08/16/12 0027  BP: 130/66  122/75 124/68  Pulse: 110  110 111  Temp:  98.1 F (36.7 C) 98 F (36.7 C) 97.8 F (36.6 C)  TempSrc:   Oral Oral  Resp: 16  20 16   Height:   5\' 6"  (1.676 m)   Weight:   81.1 kg (178 lb 12.7 oz)   SpO2: 94%  95% 97%   Blood pressure 124/68, pulse 111, temperature 97.8 F (36.6 C), temperature source Oral, resp. rate 16, height 5\' 6"  (1.676 m), weight 81.1 kg (178 lb 12.7 oz), SpO2 97.00%.  GEN:  Pleasant patient lying in the stretcher in no acute distress;  PSYCH:  does not appear anxious or depressed; affect is appropriate. HEENT: Mucous membranes pink and anicteric; PERRLA; EOM intact; no cervical lymphadenopathy nor thyromegaly or carotid bruit; no JVD; There were no stridor. Neck is very supple. Breasts:: Not examined CHEST WALL: No tenderness CHEST: Normal respiration, clear to auscultation bilaterally.  HEART: Regular rate and rhythm.  There are no murmur, rub, or gallops.   BACK: No kyphosis or scoliosis; no CVA tenderness ABDOMEN: soft and non-tender; no masses, no organomegaly, normal abdominal bowel sounds; no pannus; no intertriginous candida. There is no rebound and no distention. Rectal Exam: Not done EXTREMITIES: No bone or joint deformity; age-appropriate arthropathy of the hands and knees; no edema; no ulcerations.  There is no calf tenderness. Genitalia: not examined PULSES: 2+ and symmetric SKIN: Normal hydration no rash or ulceration CNS: Hemiparesis.  Alert.  Follow simple verbal command.   Labs on Admission:  Basic Metabolic Panel:  Recent  Labs Lab 08/15/12 2054  NA 137  K 4.7  CL 102  CO2 21  GLUCOSE 124*  BUN 6  CREATININE 0.40*  CALCIUM 9.5   Liver Function Tests:  Recent Labs Lab 08/15/12 2054  AST 60*  ALT 28  ALKPHOS 49  BILITOT 0.5  PROT 7.2  ALBUMIN 3.6   No results found for this basename: LIPASE, AMYLASE,  in the last 168 hours No results found for this basename: AMMONIA,  in the last 168 hours CBC:  Recent Labs Lab 08/16/12 0050  WBC 14.7*  NEUTROABS 11.5*  HGB 13.3  HCT 39.0  MCV 91.5  PLT 184   Cardiac Enzymes: No results found for this basename: CKTOTAL, CKMB, CKMBINDEX, TROPONINI,  in the last 168 hours  CBG:  Recent Labs Lab 08/15/12 1539 08/15/12 1922  GLUCAP 124* 132*     Radiological Exams on Admission: Dg Swallowing Func-speech Pathology  08/15/2012   Breck Coons Litaker, CCC-SLP  08/15/2012 12:52 PM Objective Swallowing Evaluation: Modified Barium Swallowing Study   Patient Details  Name: TRANNIE BARDALES MRN: [id number] Date of Birth: [birth year]  Today's Date: 08/15/2012 Time: 1100-     Past Medical History:  Past Medical History  Diagnosis Date  . Cancer   . Thyroid disease   . Hypertension   . High cholesterol   . Dendritic keratitis 2012  . Malignant neoplasm of breast (female), unspecified site 1970    S/P Rt radical mastectomy  . Unspecified hypothyroidism 2009  . Type II or unspecified type diabetes mellitus without mention  of complication, not stated as uncontrolled 2003  . Unspecified vitamin D deficiency 2009  . Pure hyperglyceridemia 2004  . Anxiety state, unspecified   . Macular degeneration (senile) of retina, unspecified 2012  . Unspecified glaucoma(365.9) 2013  . Tinnitus 2007  . Unspecified essential hypertension 2000  . Coronary atherosclerosis of native coronary artery 2003    non obstructive by Cath 2003  . Atrioventricular block, complete 2003    s/p PPM 2003, generator chnage 2011  . Allergic rhinitis due to pollen   . Acquired cyst of kidney 2002  .  Osteoarthrosis, unspecified whether generalized or localized,  unspecified site 2013  . Unspecified arthropathy, pelvic region and thigh 2010  . Senile osteoporosis 2003  . Abnormality of gait 2007  . Edema 2003  . Personal history of colonic polyps      s/p colonoscopy/polypectomy 2003  . Cardiac pacemaker in situ 2003  . CVA (cerebral infarction) 06/28/2012    rt hemiparesis, dysphagia  . NSTEMI (non-ST elevated myocardial infarction) 06/28/2012  . Urinary retention 07/07/2012   Past Surgical History:  Past Surgical History  Procedure Laterality Date  . Mastectomy Right 1970    Cancer   HPI:  79 yr old from [facility] accompanied by son, caregiver and SLP.   PMH of CVA 06/2012, hypertension, dyslipidemia, status post  permanent pacemaker placement.  In 06/2012 pt. was discharged from  hospital on modified diet texture and nectar thick liquids.  Pt.  received ST and was able to upgrade to regular texture diet and  thin liquids.  Two days ago pt. began to have acute swallowing  difficulties with regurgition and significant pharyngeal globus  sensation and has been unable to eat/ddrink x 2 days.         Assessment / Plan / Recommendation Clinical Impression  Dysphagia Diagnosis: Suspected primary esophageal  dysphagia;Severe cervical esophageal phase dysphagia Clinical impression: MBS completed with pt. initially consuming  sip of thin barium resulting in severe stasis in  cervical/proximal esophagus with significant and forceful  backflow into pharynx , laryngeal vestibule and belching.  Pt.  performed reflexive multiple swallows in attempts to clear  residual that was ineffective.  Radiologist was called into xray  suite, viewed esophagus during swallows of thin barium and   suspects pt. has an acutely impacted esophagus and recommended  either CT or EGD.  This SLP spoke with Dr. Chilton Si who wants pt.  admitted to hospital today.   SLP also spoke with Jani Gravel, NP  who stated she would call hospital and have pt. directly   admitted.  This SLP recommends, GI consult to intervene with this  acute severe cervical and suspected esophageal dysphagia.        Treatment Recommendation   (Pt. directly admitted from hospital, will need new order)    Diet Recommendation NPO        Other  Recommendations Recommended Consults: Consider  GI  evaluation   Follow Up Recommendations       Frequency and Duration        Pertinent Vitals/Pain none    SLP Swallow Goals     General HPI: 79 yr old from [facility] accompanied by son,  caregiver and SLP.  PMH of CVA 06/2012, hypertension,  dyslipidemia, status post permanent pacemaker placement.  In  06/2012 pt. was discharged from hospital on modified diet texture  and nectar thick liquids.  Pt. received ST and was able to  upgrade to regular texture diet and thin liquids.  Two days ago  pt. began to have acute swallowing difficulties with regurgition  and significant pharyngeal globus sensation and has been unable  to eat/ddrink x 2 days.     Type of Study: Modified Barium Swallowing Study Reason for Referral: Objectively evaluate swallowing function Previous Swallow Assessment:  (no MBS) Diet Prior to this Study: Regular;Thin liquids Temperature Spikes Noted: No Respiratory Status: Room air History of Recent Intubation: No Behavior/Cognition: Alert;Cooperative;Pleasant mood Oral Cavity - Dentition: Dentures, top;Dentures, bottom Self-Feeding Abilities: Able to feed self Patient Positioning: Upright in chair Baseline Vocal Quality: Clear Volitional Cough: Strong Volitional Swallow: Able to elicit Anatomy: Within functional limits Pharyngeal Secretions: Not observed secondary MBS    Reason for Referral Objectively evaluate swallowing function   Oral Phase Oral Preparation/Oral Phase Oral Phase: WFL   Pharyngeal Phase Pharyngeal Phase Pharyngeal Phase: Within functional limits  Cervical Esophageal Phase    GO    Cervical Esophageal Phase Cervical Esophageal Phase: Impaired Cervical Esophageal Phase - Comment  Cervical Esophageal Comment:  (significant stasis in proximal  esophagus)         Roque Cash Breck Coons 08/15/2012, 12:52 PM    Assessment/Plan Present on Admission:  . Swallowing dysfunction . Unspecified essential hypertension . Type II or unspecified type diabetes mellitus without mention of complication, not stated as uncontrolled . Unspecified hypothyroidism . CVA (cerebral infarction) . Hypothyroidism . Atrioventricular block, complete . DNR (do not resuscitate)  PLAN:  Will admit her to the SDU, s/p rigid EGD with food extraction, possible small perforation with procedure distal esophagus.  The cause is likely from advancing her diet with dysphagia from recent CVA.  Will give absolute NPO (not even pills), IVF, and Cefuroxime.  She is at risk for aspiration PNA.  For her DM, will continue D5 IVF.  GI and ENT will follow.  I have held ALL her medication at this time.  She is a DNR.  Thank you for allowing [place] to partake in the care of your  patient.  Other plans as per orders.  Code Status: DNR.   Houston Siren, MD. [facility] Pager [phone] 7pm to 7am.  08/16/2012, 3:17 AM

## 2012-08-16 NOTE — Op Note (Signed)
[name], Rachel Cooke              ACCOUNT NO.:  [id number]  MEDICAL RECORD NO.:  [id number]  LOCATION:  3310                         FACILITY:  [facility]  PHYSICIAN:  Newman Pies, MD            DATE OF BIRTH:  [birth year]  DATE OF PROCEDURE:  08/15/2012 DATE OF DISCHARGE:                              OPERATIVE REPORT   PREOPERATIVE DIAGNOSIS:  Esophageal food impaction.  POSTOPERATIVE DIAGNOSIS:  Esophageal food impaction.  PROCEDURE PERFORMED:  Rigid esophagoscopy with removal of impacted food.  ANESTHESIA:  General endotracheal tube anesthesia.  COMPLICATIONS:  None.  ESTIMATED BLOOD LOSS:  Minimal.  INDICATION FOR PROCEDURE:  The patient is a 79 year old female with a 2- day history of dysphagia and inability to swallow.  She had a stroke 2 months ago and has been improving nicely, but while eating a meal 2 days ago, she had an episode of dysphagia and inability to swallow.  It progressively worsened.  The patient presented to the [facility] today for barium swallow study.  She was noted to have complete impaction and obstruction of the esophagus.  The patient was seen by Dr. Ewing Schlein, who attempted an EGD.  However, the impaction was noted to be severe and ENT was consulted for rigid esophagoscopy and disimpaction.  The risks, benefits, and details of the procedure were discussed with the patient and her daughter.  Questions were invited and answered.  Informed consent was obtained.  DESCRIPTION:  The patient was taken to the operating room and placed supine on the operating table.  General endotracheal tube anesthesia was administered by the anesthesiologist.  The patient was positioned and prepped and draped in a standard fashion for esophagoscopy.  The rigid esophagoscope was inserted via the oral cavity into the esophageal inlet.  A large amount of impacted food particles and barium material were noted.  The food particle was mostly pieces of chicken.  The impacted  food and barium material were carefully suctioned and removed using a combination of cup forceps and alligator forceps.  The esophagoscope was slowly advanced through the esophagus.  It was noted that the entire esophagus was completely impacted.  The food particles were carefully disimpacted slowly.  The esophagoscope was carefully advanced until the distal portion of the esophagus.  At this time, the rigid esophagoscope could no longer be advanced secondary to the curvature of the distal esophagus.  Dr. Ewing Schlein was therefore reconsulted for flexible EGD and removal of the remaining impacted food.  Please see Dr. Marlane Hatcher operative note for details.  Upon completion of the disimpaction procedure, the care of the patient was turned over to the anesthesiologist.  The patient was awakened from anesthesia without difficulty.  She was extubated and transferred to the recovery room in good condition.  OPERATIVE FINDINGS:  Severe impaction of the entire esophagus with food particles.  SPECIMEN:  None.  FOLLOWUP CARE:  The patient will be admitted to the hospitalist service for observation.     Newman Pies, MD     ST/MEDQ  D:  08/15/2012  T:  08/16/2012  Job:  [id number]

## 2012-08-17 ENCOUNTER — Inpatient Hospital Stay (HOSPITAL_COMMUNITY): Payer: Medicare Other

## 2012-08-17 DIAGNOSIS — T18108A Unspecified foreign body in esophagus causing other injury, initial encounter: Secondary | ICD-10-CM | POA: Diagnosis not present

## 2012-08-17 DIAGNOSIS — R1310 Dysphagia, unspecified: Secondary | ICD-10-CM | POA: Diagnosis not present

## 2012-08-17 DIAGNOSIS — R933 Abnormal findings on diagnostic imaging of other parts of digestive tract: Secondary | ICD-10-CM | POA: Diagnosis not present

## 2012-08-17 DIAGNOSIS — R633 Feeding difficulties: Secondary | ICD-10-CM | POA: Diagnosis not present

## 2012-08-17 LAB — GLUCOSE, CAPILLARY
Glucose-Capillary: 135 mg/dL — ABNORMAL HIGH (ref 70–99)
Glucose-Capillary: 116 mg/dL — ABNORMAL HIGH (ref 70–99)
Glucose-Capillary: 125 mg/dL — ABNORMAL HIGH (ref 70–99)

## 2012-08-17 LAB — CBC WITH DIFFERENTIAL/PLATELET
Basophils Absolute: 0 10*3/uL (ref 0.0–0.1)
Eosinophils Absolute: 0.2 10*3/uL (ref 0.0–0.7)
Eosinophils Relative: 2 % (ref 0–5)
Lymphocytes Relative: 25 % (ref 12–46)
MCH: 31.2 pg (ref 26.0–34.0)
MCV: 93.9 fL (ref 78.0–100.0)
Neutrophils Relative %: 62 % (ref 43–77)
Platelets: 172 10*3/uL (ref 150–400)
RBC: 3.78 MIL/uL — ABNORMAL LOW (ref 3.87–5.11)
RDW: 14.1 % (ref 11.5–15.5)
WBC: 8.5 10*3/uL (ref 4.0–10.5)

## 2012-08-17 LAB — BASIC METABOLIC PANEL
Calcium: 8.9 mg/dL (ref 8.4–10.5)
GFR calc non Af Amer: 81 mL/min — ABNORMAL LOW (ref >90)
Potassium: 3.5 mEq/L (ref 3.5–5.1)
Sodium: 141 mEq/L (ref 135–145)

## 2012-08-17 MED ORDER — IOHEXOL 300 MG/ML  SOLN: 100.0000 mL | Freq: Once | INTRAMUSCULAR | Status: AC | PRN

## 2012-08-17 NOTE — Progress Notes (Signed)
Rachel Cooke 9:39 AM  Subjective: Patient's throat still hurts and she can swallow but doesn't want to and her abdomen is okay and she breathes better with the facemask  Objective: Vital signs stable afebrile neck a little tender to the touch lungs are clear heart regular rate and rhythm abdomen is soft nontender white count decreased other labs stable  Assessment: Status post significant food impaction still with sore throat  Plan: I had a long talk with the daughter and radiology and we will proceed with a Gastrografin swallow to rule out a proximal or distal problem and based on those findings consider recalling  Dr Rachel Cooke or CVTS or possibly even TPN for a while and continuing antibiotics  Rachel Cooke E

## 2012-08-17 NOTE — Progress Notes (Signed)
PT Cancellation Note  Patient Details Name: Rachel Cooke MRN: [id number] DOB: 12/10/1919   Cancelled Treatment:    Reason Eval/Treat Not Completed: Patient at procedure or test/unavailable   INGOLD,Shaquille Janes 08/17/2012, 10:13 AM  [facility] [phone] (pager)

## 2012-08-17 NOTE — Progress Notes (Signed)
[facility] Progress Note [place] TEAM 1 - Stepdown/ICU TEAM   Rachel Cooke WUJ:[id number] DOB: 1919/04/26 DOA: 08/15/2012 PCP: Kimber Relic, MD  Brief narrative: 79 y.o. female with 2 day history of dysphagia and inability to swallow. She had a stroke 2 months previously and had been improving nicely but while eating a meal 2 days prior to admission had acute onset of inability to swallow. The day of admission she presented to radiology and was found to have an impaction on barium swallow. On her upper GI exam, Dr. Ewing Schlein noted severe barium and food impaction of her esophagus. Attempts to remove the impaction were unsuccesful. ENT was therefore consulted for rigid esophagoscopy and disimpaction.  This was carried out in the OR under general anesthesia, with concern for possible esophageal tear during the procedure.  As a result, the pt was admitted by [facility] post-op for close monitoring.  Assessment/Plan:  Food impaction of esophagus As per GI - favorable Gastrografin esophagram today - still experiencing significant pain  Concern for esophageal perforation during disimpaction The patient had a favorable Gastrografin esophagram today - ongoing care as per GI  Hypothyroidism TSH at goal   Left parietal corona radiata/centrum semiovale infarct 06/28/2012 R hemiparesis + dysphagia - etiology felt to be small vessel disease - plavix daily will need to be resumed as soon as patient can tolerate oral medications (had been on ASA 81mg  at time of event) - resume PT/OT  Type II or unspecified type diabetes mellitus  CBG well controlled at present   Unspecified essential hypertension Blood pressure is well-controlled at this time  Atrioventricular block, complete - s/p PPM 2003 (changed 2011)  Macular degeneration   Code Status: NO CODE Family Communication: spoke w/ pt and son at bedside  Disposition Plan: SDU - probable transfer to medical bed in  a.m.  Consultants: ENT GI  Procedures: 6/13 - EGD - esophagus almost completely full with debris food and barium status post Lucina Mellow net removal of small bits x2 6/13 - rigid esophagoscopy with foreign body removal in the OR under general anesthesia x4-5 hrs w/ questionable significant distal tear 6/15 - esophagram - no evidence of esophageal perforation or obstruction - esophageal dysmotility with tertiary contractions in the distal esophagus  Antibiotics: Cefuroxime 6/13 >>  DVT prophylaxis: SCDs  HPI/Subjective: The patient is alert and conversant.  She is quite anxious to begin therapy and to be discharged from the hospital.  She continues to complain of severe pain in her throat.  She denies shortness of breath fevers chills nausea or vomiting.  Objective: Blood pressure 108/54, pulse 89, temperature 98.9 F (37.2 C), temperature source Oral, resp. rate 14, height 5\' 6"  (1.676 m), weight 81.1 kg (178 lb 12.7 oz), SpO2 95.00%.  Intake/Output Summary (Last 24 hours) at 08/17/12 1551 Last data filed at 08/17/12 0800  Gross per 24 hour  Intake    900 ml  Output    250 ml  Net    650 ml   Exam: General: No acute respiratory distress Lungs: Clear to auscultation bilaterally without wheezes or crackles Cardiovascular: Regular rate and rhythm without murmur gallop or rub normal S1 and S2 Abdomen: Nontender, nondistended, soft, bowel sounds positive, no rebound, no ascites, no appreciable mass Extremities: No significant cyanosis, clubbing, or edema bilateral lower extremities  Data Reviewed: Basic Metabolic Panel:  Recent Labs Lab 08/15/12 2054 08/17/12 0416  NA 137 141  K 4.7 3.5  CL 102 104  CO2 21 30  GLUCOSE 124* 131*  BUN 6 6  CREATININE 0.40* 0.54  CALCIUM 9.5 8.9   Liver Function Tests:  Recent Labs Lab 08/15/12 2054  AST 60*  ALT 28  ALKPHOS 49  BILITOT 0.5  PROT 7.2  ALBUMIN 3.6   CBC:  Recent Labs Lab 08/16/12 0050 08/17/12 0416  WBC 14.7*  8.5  NEUTROABS 11.5* 5.3  HGB 13.3 11.8*  HCT 39.0 35.5*  MCV 91.5 93.9  PLT 184 172   CBG:  Recent Labs Lab 08/16/12 1946 08/17/12 0025 08/17/12 0325 08/17/12 0749 08/17/12 1119  GLUCAP 125* 125* 135* 119* 106*    Recent Results (from the past 240 hour(s))  MRSA PCR SCREENING     Status: None   Collection Time    08/16/12  3:14 AM      Result Value Range Status   MRSA by PCR NEGATIVE  NEGATIVE Final   Comment:            The GeneXpert MRSA Assay (FDA     approved for NASAL specimens     only), is one component of a     comprehensive MRSA colonization     surveillance program. It is not     intended to diagnose MRSA     infection nor to guide or     monitor treatment for     MRSA infections.     Studies:  Recent x-ray studies have been reviewed in detail by the Attending Physician  Scheduled Meds:  Scheduled Meds: . cefUROXime (ZINACEF)  IV  750 mg Intravenous Q8H  . insulin aspart  0-9 Units Subcutaneous Q4H  . pantoprazole (PROTONIX) IV  40 mg Intravenous Q12H  . sodium chloride  3 mL Intravenous Q12H    Time spent on care of this patient: 35 minutes   [facility] T  [facility] Office  [phone] Pager - Text Page per Loretha Stapler as per below:  On-Call/Text Page:      [email or web address]      password [email or web address]  If 7PM-7AM, please contact night-coverage [email or web address] Password [email or web address] 08/17/2012, 3:51 PM   LOS: 2 days

## 2012-08-18 DIAGNOSIS — R1310 Dysphagia, unspecified: Secondary | ICD-10-CM | POA: Diagnosis not present

## 2012-08-18 DIAGNOSIS — R933 Abnormal findings on diagnostic imaging of other parts of digestive tract: Secondary | ICD-10-CM | POA: Diagnosis not present

## 2012-08-18 DIAGNOSIS — R633 Feeding difficulties: Secondary | ICD-10-CM | POA: Diagnosis not present

## 2012-08-18 DIAGNOSIS — T18108A Unspecified foreign body in esophagus causing other injury, initial encounter: Secondary | ICD-10-CM | POA: Diagnosis not present

## 2012-08-18 LAB — COMPREHENSIVE METABOLIC PANEL
Albumin: 2.7 g/dL — ABNORMAL LOW (ref 3.5–5.2)
Alkaline Phosphatase: 43 U/L (ref 39–117)
BUN: 5 mg/dL — ABNORMAL LOW (ref 6–23)
Calcium: 8.8 mg/dL (ref 8.4–10.5)
GFR calc Af Amer: >90 mL/min (ref >90)
Glucose, Bld: 124 mg/dL — ABNORMAL HIGH (ref 70–99)
Potassium: 3.5 mEq/L (ref 3.5–5.1)
Total Protein: 6.2 g/dL (ref 6.0–8.3)

## 2012-08-18 LAB — CBC WITH DIFFERENTIAL/PLATELET
Basophils Relative: 0 % (ref 0–1)
Eosinophils Absolute: 0.2 10*3/uL (ref 0.0–0.7)
Eosinophils Relative: 3 % (ref 0–5)
Hemoglobin: 12 g/dL (ref 12.0–15.0)
MCH: 31.2 pg (ref 26.0–34.0)
MCHC: 32.8 g/dL (ref 30.0–36.0)
MCV: 95.1 fL (ref 78.0–100.0)
Monocytes Relative: 10 % (ref 3–12)
Neutrophils Relative %: 58 % (ref 43–77)
Platelets: 165 10*3/uL (ref 150–400)

## 2012-08-18 LAB — GLUCOSE, CAPILLARY
Glucose-Capillary: 130 mg/dL — ABNORMAL HIGH (ref 70–99)
Glucose-Capillary: 114 mg/dL — ABNORMAL HIGH (ref 70–99)
Glucose-Capillary: 110 mg/dL — ABNORMAL HIGH (ref 70–99)
Glucose-Capillary: 116 mg/dL — ABNORMAL HIGH (ref 70–99)

## 2012-08-18 MED ORDER — SODIUM CHLORIDE 0.9 % IJ SOLN
INTRAMUSCULAR | Status: AC
  Administered 2012-08-18: 10 mL
  Filled 2012-08-18: qty 20

## 2012-08-18 MED ORDER — MORPHINE SULFATE 2 MG/ML IJ SOLN: 1.0000 mg | INTRAMUSCULAR | Status: DC | PRN

## 2012-08-18 MED ORDER — WHITE PETROLATUM GEL
Status: AC
  Administered 2012-08-19: 01:00:00
  Filled 2012-08-18: qty 5

## 2012-08-18 MED ORDER — LEVOTHYROXINE SODIUM 100 MCG IV SOLR
37.5000 ug | Freq: Every day | INTRAVENOUS | Status: DC
  Administered 2012-08-19: 37.5 ug via INTRAVENOUS
  Filled 2012-08-18 (×2): qty 5

## 2012-08-18 MED ORDER — INSULIN ASPART 100 UNIT/ML ~~LOC~~ SOLN
0.0000 [IU] | Freq: Three times a day (TID) | SUBCUTANEOUS | Status: DC
  Administered 2012-08-19 (×2): 1 [IU] via SUBCUTANEOUS

## 2012-08-18 NOTE — Evaluation (Signed)
Physical Therapy Evaluation Patient Details Name: Rachel Cooke MRN: [id number] DOB: [birth year] Today's Date: 08/18/2012 Time: 7829-5621 PT Time Calculation (min): 32 min  PT Assessment / Plan / Recommendation Clinical Impression  79 y.o. female admitted to [facility] with esophageal obstruction that required surgical extraction under anesthesia.  The pt presents today with continued deficits from recent stroke affecting her right side strength.  We were able to get her into her WC with two person assist.  She would benefit from continued rehab at [facility] when she leaves the hospital.      PT Assessment  Patient needs continued PT services    Follow Up Recommendations  SNF    Does the patient have the potential to tolerate intense rehabilitation     NA  Barriers to Discharge None None    Equipment Recommendations  None recommended by PT    Recommendations for Other Services   none  Frequency Min 2X/week    Precautions / Restrictions Precautions Precautions: Fall Precaution Comments: right hemiperesis.    Pertinent Vitals/Pain See vitals flow sheet.      Mobility  Bed Mobility Bed Mobility: Supine to Sit;Sitting - Scoot to Edge of Bed Supine to Sit: 2: Max assist Sitting - Scoot to [place] of Bed: 2: Max assist Details for Bed Mobility Assistance: max assist to support trunk and progress right leg over EOB.  Max asssit to help weight shift hips and trunk to scoot.  Verbal cues for hand placement (manual assist with this as well) and verbal and manual cues for weight shift while scooting.   Transfers Transfers: Systems analyst;Lateral/Scoot Transfers Lateral/Scoot Transfers: 1: +2 Total assist;With armrests removed Lateral Transfers: Patient Percentage: 20% Details for Transfer Assistance: scoot pivot transfer to her right from level surface bed to [facility] with right armrest removed.  Two person assist to boost trunk up over weak legs verbal and manual cues for hand placement  and weight shifting of body to get to chair.  Due to pt's body shape and weaknees on her right side she had difficutly getting leverage with her arms for the tranasfer.   Corporate treasurer: Yes Wheelchair Assistance: 2: Max Chiropodist Details (indicate cue type and reason): max assist for hand over hand propulsion to demonstrate correct technique and max assist to help steer due to chair is not hemi height and she had difficulty steering with her left foot.   Wheelchair Propulsion: Left upper extremity;Left lower extremity Wheelchair Parts Management: Needs assistance Distance: 35        PT Diagnosis: Difficulty walking;Hemiplegia dominant side;Abnormality of gait;Generalized weakness;Acute pain  PT Problem List: Decreased strength;Decreased activity tolerance;Decreased balance;Decreased mobility;Decreased knowledge of use of DME;Obesity;Pain PT Treatment Interventions: DME instruction;Functional mobility training;Therapeutic activities;Therapeutic exercise;Balance training;Neuromuscular re-education;Patient/family education;Wheelchair mobility training   PT Goals Acute Rehab PT Goals PT Goal Formulation: With patient/family Time For Goal Achievement: 09/01/12 Potential to Achieve Goals: Good Pt will go Supine/Side to Sit: with mod assist PT Goal: Supine/Side to Sit - Progress: Goal set today Pt will go Sit to Supine/Side: with mod assist PT Goal: Sit to Supine/Side - Progress: Goal set today Pt will Transfer Bed to Chair/Chair to Bed: with mod assist PT Transfer Goal: Bed to Chair/Chair to Bed - Progress: Goal set today Pt will Propel Wheelchair: 10 - 50 feet;with mod assist PT Goal: Propel Wheelchair - Progress: Goal set today  Visit Information  Last PT Received On: 08/18/12 Assistance Needed: +2    Subjective Data  Subjective:  Pt reports that she had not gotten too far with rehab at [facility] before this happened.   Patient Stated Goal:  to continue rehab for her stroke   Prior Functioning  Home Living Available Help at Discharge: Skilled Nursing Facility ([facility]) Home Adaptive Equipment: Wheelchair - manual Communication Communication: HOH    Cognition  Cognition Arousal/Alertness: Awake/alert    Extremity/Trunk Assessment Right Lower Extremity Assessment RLE ROM/Strength/Tone: Deficits RLE ROM/Strength/Tone Deficits: ankle 2/5, knee 2+/5, hip 2-/5.   Left Lower Extremity Assessment LLE ROM/Strength/Tone: Deficits LLE ROM/Strength/Tone Deficits: grossly 4/5   Balance Balance Balance Assessed: Yes Static Sitting Balance Static Sitting - Balance Support: Bilateral upper extremity supported;Feet supported Static Sitting - Level of Assistance: 5: Stand by assistance  End of Session PT - End of Session Equipment Utilized During Treatment: Gait belt Activity Tolerance: Patient limited by fatigue Patient left: in chair;with family/visitor present (in WC at window in hallway with daughter.  OT to return )    Lurena Joiner B. Jamilett Ferrante, PT, DPT [phone]   08/18/2012, 12:12 PM

## 2012-08-18 NOTE — Progress Notes (Signed)
Rachel Cooke 1:14 PM  Subjective: Patient is doing much better and swallowing better and having less neck and throat discomfort  Objective: Vital signs stable afebrile no acute distress abdomen is soft nontender  Assessment: Improved  Plan: Discuss case with primary team and daughter will advance diet slowly with a clear liquid tray today and possibly full liquids tomorrow but would not advance past the pured stage for a long time  [facility] E

## 2012-08-18 NOTE — Progress Notes (Signed)
Occupational Therapy Evaluation Patient Details Name: Rachel Cooke MRN: [id number] DOB: 1919-04-07 Today's Date: 08/18/2012 Time: 4098-1191 OT Time Calculation (min): 33 min  OT Assessment / Plan / Recommendation Clinical Impression  79 y.o. female admitted to [facility] with esophageal obstruction that required surgical extraction under anesthesia. PTA, pt lived at [facility]  and was going through rehab due to CVA she suffered @ 2 months ago. Pt currently max A with most ADL amd total A +2 for mobility.  Prior to the CVA, pt was independent with taking care of herself in independent living.. Pt will benefit from skilled OT services to facilitate D/C to next venue due to below deficits.    OT Assessment  Patient needs continued OT Services    Follow Up Recommendations  SNF    Barriers to Discharge None    Equipment Recommendations  None recommended by OT    Recommendations for Other Services    Frequency  Min 2X/week    Precautions / Restrictions Precautions Precautions: Fall Precaution Comments: right hemiperesis.    Pertinent Vitals/Pain desat to 85 on RA    ADL  Eating/Feeding: Other (comment) (beginning ice chips.) Grooming: Moderate assistance Where Assessed - Grooming: Unsupported sitting Upper Body Bathing: Maximal assistance Where Assessed - Upper Body Bathing: Unsupported sitting Lower Body Bathing: +1 Total assistance Where Assessed - Lower Body Bathing: Lean right and/or left Upper Body Dressing: +1 Total assistance Where Assessed - Upper Body Dressing: Unsupported sitting Lower Body Dressing: +1 Total assistance Where Assessed - Lower Body Dressing: Supine, head of bed up;Rolling right and/or left Toilet Transfer: +2 Total assistance;Simulated Toilet Transfer: Patient Percentage: 20% Equipment Used: Wheelchair;Gait belt Transfers/Ambulation Related to ADLs: +2 total assist using over the back technique and use of chuck ADL Comments: Family states pt has had  a functional decline    OT Diagnosis: Generalized weakness;Cognitive deficits;Disturbance of vision;Hemiplegia dominant side  OT Problem List: Decreased strength;Decreased range of motion;Decreased activity tolerance;Impaired balance (sitting and/or standing);Impaired vision/perception;Decreased coordination;Decreased cognition;Decreased safety awareness;Decreased knowledge of use of DME or AE;Cardiopulmonary status limiting activity;Impaired sensation;Obesity;Impaired UE functional use;Increased edema OT Treatment Interventions: Self-care/ADL training;Therapeutic exercise;Neuromuscular education;Energy conservation;DME and/or AE instruction;Therapeutic activities;Cognitive remediation/compensation;Visual/perceptual remediation/compensation;Patient/family education;Balance training   OT Goals Acute Rehab OT Goals OT Goal Formulation: With patient Time For Goal Achievement: 09/01/12 Potential to Achieve Goals: Good ADL Goals Pt Will Perform Grooming: with min assist;Sitting, chair;Supported;with cueing (comment type and amount) (min vc) ADL Goal: Grooming - Progress: Goal set today Pt Will Perform Upper Body Bathing: with min assist;Sitting at sink;Sitting, chair ADL Goal: Upper Body Bathing - Progress: Goal set today Pt Will Perform Upper Body Dressing: with min assist;Sitting, chair;Unsupported ADL Goal: Upper Body Dressing - Progress: Goal set today Arm Goals Pt Will Complete Theraputty Exer: with supervision, verbal cues required/provided;to increase strength;Bilateral upper extremities Miscellaneous OT Goals Miscellaneous OT Goal #1: Complete sliding board transfer to chair with max A OT Goal: Miscellaneous Goal #1 - Progress: Goal set today  Visit Information  Last OT Received On: 08/18/12 Assistance Needed: +2 PT/OT Co-Evaluation/Treatment: Yes    Subjective Data      Prior Functioning     Home Living Available Help at Discharge: Skilled Nursing Facility ([facility]) Home  Adaptive Equipment: Wheelchair - manual Prior Function Level of Independence: Needs assistance Needs Assistance: Bathing;Dressing;Grooming;Toileting;Meal Prep;Light Housekeeping;Gait;Transfers Bath: Maximal Dressing: Maximal Grooming: Moderate Toileting: Total Meal Prep: Total Light Housekeeping: Total Gait Assistance: nonambulatory.pregait Transfer Assistance: using sliding board at SNF. lift by nursing Comments: Prior to CVA, lived  in independent living apt Communication Communication: HOH         Vision/Perception Vision - History Baseline Vision: Wears glasses all the time Patient Visual Report: No change from baseline (Pt appears to demonstrate R field cut) Vision - Assessment Eye Alignment: Within Functional Limits Additional Comments: not formally tested but appers to have R field cut Praxis Praxis: Intact   Cognition  Cognition Arousal/Alertness: Awake/alert Behavior During Therapy: WFL for tasks assessed/performed Overall Cognitive Status: Impaired/Different from baseline Area of Impairment: Awareness;Attention;Problem solving Current Attention Level: Sustained Awareness: Emergent Problem Solving: Slow processing;Difficulty sequencing;Requires tactile cues;Requires verbal cues General Comments: decreased mental flexibility    Extremity/Trunk Assessment Right Upper Extremity Assessment RUE ROM/Strength/Tone: Deficits RUE ROM/Strength/Tone Deficits: s/p CVA. Brunstrom stage IV hand and arm RUE Sensation: Deficits (decrased light touch) RUE Coordination: Deficits RUE Coordination Deficits: cues to use R hand as functional assist. (poor hand strength. poor fine/gross motor coordination) Left Upper Extremity Assessment LUE ROM/Strength/Tone: WFL for tasks assessed LUE Sensation: WFL - Light Touch;WFL - Proprioception LUE Coordination: WFL - gross/fine motor Right Lower Extremity Assessment RLE ROM/Strength/Tone: Deficits RLE ROM/Strength/Tone Deficits: ankle  2/5, knee 2+/5, hip 2-/5.   Left Lower Extremity Assessment LLE ROM/Strength/Tone: Deficits LLE ROM/Strength/Tone Deficits: grossly 4/5     Mobility Bed Mobility Bed Mobility: Supine to Sit;Sitting - Scoot to Edge of Bed Supine to Sit: 2: Max assist Sitting - Scoot to [place] of Bed: 2: Max assist Details for Bed Mobility Assistance: max assist to support trunk and progress right leg over EOB.  Max asssit to help weight shift hips and trunk to scoot.  Verbal cues for hand placement (manual assist with this as well) and verbal and manual cues for weight shift while scooting.   Transfers Details for Transfer Assistance: scoot pivot transfer to her right from level surface bed to [facility] with right armrest removed.  Two person assist to boost trunk up over weak legs verbal and manual cues for hand placement and weight shifting of body to get to chair.  Due to pt's body shape and weaknees on her right side she had difficutly getting leverage with her arms for the tranasfer.       Exercise Other Exercises Other Exercises: encouraged R UE A/AAROM with daughter   Balance Balance Balance Assessed: Yes Static Sitting Balance Static Sitting - Balance Support: Bilateral upper extremity supported;Feet supported Static Sitting - Level of Assistance: 5: Stand by assistance   End of Session OT - End of Session Equipment Utilized During Treatment: Gait belt Activity Tolerance: Patient tolerated treatment well Patient left: in chair;with call bell/phone within reach;with family/visitor present Nurse Communication: Mobility status  GO     Undrea Shipes,HILLARY 08/18/2012, 12:28 PM [facility], OTR/L  [phone] 08/18/2012

## 2012-08-18 NOTE — Progress Notes (Signed)
[facility] Progress Note [place] TEAM 1 - Stepdown/ICU TEAM   Rachel Cooke MVH:[id number] DOB: 09-18-1919 DOA: 08/15/2012 PCP: Kimber Relic, MD  Brief narrative: 79 y.o. female with 2 day history of dysphagia and inability to swallow. She had a stroke 2 months previously and had been improving nicely but while eating a meal 2 days prior to admission had acute onset of inability to swallow. The day of admission she presented to radiology for a barium swallow study and was found to have an esophageal impaction. GI was consulted through the ER and on EGD Dr. Ewing Schlein noted severe barium and food impaction of her esophagus. Attempts to remove the impaction were unsuccessful via EGD. ENT was therefore consulted for rigid esophagoscopy and disimpaction.  This was carried out in the OR under general anesthesia, with concern for possible esophageal tear during the procedure.  As a result, the pt was admitted by [facility] post-op for close monitoring.  Since admission, the pt has been kept npo.  A gastrograffin esophagram has been completed and revealed no evidence of an esophageal tear.  As a result, the pt has been advance to clear liquids.  She will very slowly advance her diet toward a pureed diet over the next 49-72hrs.  The ultimate goal is for d/c back to [facility] on a pureed only diet with outpt f/u with Dr. Ewing Schlein, who will determine when diet can be advanced beyond puree as an outpt.  Assessment/Plan:  Food impaction of esophagus As per GI - favorable Gastrografin esophagram 6/15 - pain is improving - clear liquids per GI, with plan to change to full liquid tomorrow as able - see discussion above concerning ultimate tx plan   Concern for esophageal perforation during disimpaction The patient had a favorable Gastrografin esophagram today - ongoing care as per GI  Hypothyroidism TSH at goal   Left parietal corona radiata/centrum semiovale infarct 06/28/2012 R hemiparesis + dysphagia  - etiology felt to be small vessel disease - plavix daily will need to be resumed as soon as patient can tolerate oral medications/when tolerating at least a full liquid diet (had been on ASA 81mg  at time of event) - resume PT/OT/SLP  Type II or unspecified type diabetes mellitus  CBG well controlled at present - appears to be diet controlled at home   Unspecified essential hypertension Blood pressure is well-controlled at this time  Atrioventricular block, complete - s/p PPM 2003 (changed 2011)  Macular degeneration   Code Status: NO CODE Family Communication: spoke w/ pt and daughter at bedside Disposition Plan: stable for transfer to med bed  Consultants: ENT GI  Procedures: 6/13 - EGD - esophagus almost completely full with debris food and barium status post Lucina Mellow net removal of small bits x2 6/13 - rigid esophagoscopy with foreign body removal in the OR under general anesthesia x4-5 hrs w/ questionable significant distal tear 6/15 - esophagram - no evidence of esophageal perforation or obstruction - esophageal dysmotility with tertiary contractions in the distal esophagus  Antibiotics: Cefuroxime 6/13 >>  DVT prophylaxis: SCDs  HPI/Subjective: The patient reports decreased pain in the throat and mouth.  She denies cp, sob, f/c, or n/v.  Objective: Blood pressure 137/66, pulse 92, temperature 97.9 F (36.6 C), temperature source Oral, resp. rate 10, height 5\' 6"  (1.676 m), weight 81.1 kg (178 lb 12.7 oz), SpO2 88.00%.  Intake/Output Summary (Last 24 hours) at 08/18/12 1447 Last data filed at 08/18/12 0933  Gross per 24 hour  Intake  1210 ml  Output      0 ml  Net   1210 ml   Exam: General: No acute respiratory distress Lungs: Clear to auscultation bilaterally without wheezes or crackles Cardiovascular: Regular rate and rhythm without murmur gallop or rub normal S1 and S2 Abdomen: Nontender, nondistended, soft, bowel sounds positive, no rebound, no ascites, no  appreciable mass Extremities: No significant cyanosis, clubbing, or edema bilateral lower extremities  Data Reviewed: Basic Metabolic Panel:  Recent Labs Lab 08/15/12 2054 08/17/12 0416 08/18/12 0505  NA 137 141 142  K 4.7 3.5 3.5  CL 102 104 105  CO2 21 30 31   GLUCOSE 124* 131* 124*  BUN 6 6 5*  CREATININE 0.40* 0.54 0.50  CALCIUM 9.5 8.9 8.8   Liver Function Tests:  Recent Labs Lab 08/15/12 2054 08/18/12 0505  AST 60* 25  ALT 28 15  ALKPHOS 49 43  BILITOT 0.5 0.5  PROT 7.2 6.2  ALBUMIN 3.6 2.7*   CBC:  Recent Labs Lab 08/16/12 0050 08/17/12 0416 08/18/12 0505  WBC 14.7* 8.5 7.6  NEUTROABS 11.5* 5.3 4.4  HGB 13.3 11.8* 12.0  HCT 39.0 35.5* 36.6  MCV 91.5 93.9 95.1  PLT 184 172 165   CBG:  Recent Labs Lab 08/17/12 2026 08/17/12 2313 08/18/12 0451 08/18/12 0733 08/18/12 1332  GLUCAP 125* 130* 121* 131* 114*    Recent Results (from the past 240 hour(s))  MRSA PCR SCREENING     Status: None   Collection Time    08/16/12  3:14 AM      Result Value Range Status   MRSA by PCR NEGATIVE  NEGATIVE Final   Comment:            The GeneXpert MRSA Assay (FDA     approved for NASAL specimens     only), is one component of a     comprehensive MRSA colonization     surveillance program. It is not     intended to diagnose MRSA     infection nor to guide or     monitor treatment for     MRSA infections.     Studies:  Recent x-ray studies have been reviewed in detail by the Attending Physician  Scheduled Meds:  Scheduled Meds: . cefUROXime (ZINACEF)  IV  750 mg Intravenous Q8H  . insulin aspart  0-9 Units Subcutaneous TID WC  . pantoprazole (PROTONIX) IV  40 mg Intravenous Q12H  . sodium chloride  3 mL Intravenous Q12H    Time spent on care of this patient: 35 minutes   [facility]  [facility] Office  [phone] Pager - Text Page per Loretha Stapler as per below:  On-Call/Text Page:      [email or web address]      password [email or web address]  If  7PM-7AM, please contact night-coverage [email or web address] Password [email or web address] 08/18/2012, 2:47 PM   LOS: 3 days

## 2012-08-18 NOTE — Progress Notes (Signed)
Utilization review completed.  

## 2012-08-19 ENCOUNTER — Non-Acute Institutional Stay (SKILLED_NURSING_FACILITY): Payer: Medicare Other | Admitting: Geriatric Medicine

## 2012-08-19 ENCOUNTER — Encounter (HOSPITAL_COMMUNITY): Payer: Self-pay | Admitting: Otolaryngology

## 2012-08-19 DIAGNOSIS — Z66 Do not resuscitate: Secondary | ICD-10-CM

## 2012-08-19 DIAGNOSIS — I6350 Cerebral infarction due to unspecified occlusion or stenosis of unspecified cerebral artery: Secondary | ICD-10-CM

## 2012-08-19 DIAGNOSIS — T17408A Unspecified foreign body in trachea causing other injury, initial encounter: Secondary | ICD-10-CM | POA: Diagnosis not present

## 2012-08-19 DIAGNOSIS — I69991 Dysphagia following unspecified cerebrovascular disease: Secondary | ICD-10-CM | POA: Diagnosis not present

## 2012-08-19 DIAGNOSIS — R1310 Dysphagia, unspecified: Secondary | ICD-10-CM | POA: Diagnosis not present

## 2012-08-19 DIAGNOSIS — E119 Type 2 diabetes mellitus without complications: Secondary | ICD-10-CM | POA: Diagnosis not present

## 2012-08-19 DIAGNOSIS — T18128A Food in esophagus causing other injury, initial encounter: Secondary | ICD-10-CM

## 2012-08-19 DIAGNOSIS — R633 Feeding difficulties: Secondary | ICD-10-CM | POA: Diagnosis not present

## 2012-08-19 DIAGNOSIS — T18108A Unspecified foreign body in esophagus causing other injury, initial encounter: Secondary | ICD-10-CM | POA: Diagnosis not present

## 2012-08-19 DIAGNOSIS — R933 Abnormal findings on diagnostic imaging of other parts of digestive tract: Secondary | ICD-10-CM | POA: Diagnosis not present

## 2012-08-19 DIAGNOSIS — K224 Dyskinesia of esophagus: Secondary | ICD-10-CM

## 2012-08-19 DIAGNOSIS — I639 Cerebral infarction, unspecified: Secondary | ICD-10-CM

## 2012-08-19 HISTORY — DX: Dyskinesia of esophagus: K22.4

## 2012-08-19 LAB — GLUCOSE, CAPILLARY: Glucose-Capillary: 137 mg/dL — ABNORMAL HIGH (ref 70–99)

## 2012-08-19 MED ORDER — ESCITALOPRAM OXALATE 20 MG PO TABS
20.0000 mg | ORAL_TABLET | Freq: Every day | ORAL | Status: DC
  Filled 2012-08-19: qty 1

## 2012-08-19 MED ORDER — SODIUM CHLORIDE 0.9 % IJ SOLN
INTRAMUSCULAR | Status: AC
  Administered 2012-08-19: 10 mL
  Filled 2012-08-19: qty 10

## 2012-08-19 MED ORDER — PANTOPRAZOLE SODIUM 40 MG PO TBEC: 40.0000 mg | DELAYED_RELEASE_TABLET | Freq: Two times a day (BID) | ORAL | Status: DC

## 2012-08-19 MED ORDER — LEVOTHYROXINE SODIUM 75 MCG PO TABS: 75.0000 ug | ORAL_TABLET | Freq: Every day | ORAL | Status: DC

## 2012-08-19 NOTE — Discharge Summary (Signed)
Physician Discharge Summary  WHITLEY PATCHEN WUJ:[id number] DOB: 1920-02-27 DOA: 08/15/2012  PCP: Kimber Relic, MD  Admit date: 08/15/2012 Discharge date: 08/19/2012  Time spent: 45 minutes  Recommendations for Outpatient Follow-up:  Resume Plavix on 6/19  Discharge Diagnoses:  Principal Problem:   Food impaction of esophagus Active Problems:   Hypothyroidism   CVA (cerebral infarction)   Unspecified hypothyroidism   Type II or unspecified type diabetes mellitus without mention of complication, not stated as uncontrolled   Unspecified essential hypertension   Atrioventricular block, complete   Swallowing dysfunction   DNR (do not resuscitate)   Discharge Condition: stable  Diet recommendation: full liquids x 48 hr follow by pureed, diabetic, heart healthy diet.   Filed Weights   08/15/12 2130  Weight: 81.1 kg (178 lb 12.7 oz)    History of present illness:  79 y.o. female with 2 day history of dysphagia and inability to swallow. She had a stroke 2 months previously and had been improving nicely but while eating a meal 2 days prior to admission had acute onset of inability to swallow. The day of admission she presented to radiology for a barium swallow study and was found to have an esophageal impaction. GI was consulted through the ER and on EGD Dr. Ewing Schlein noted severe barium and food impaction of her esophagus. Attempts to remove the impaction were unsuccessful via EGD. ENT was therefore consulted for rigid esophagoscopy and disimpaction. This was carried out in the OR under general anesthesia, with concern for possible esophageal tear during the procedure. As a result, the pt was admitted by [facility] post-op for close monitoring.   A gastrograffin esophagram has been completed and revealed no evidence of an esophageal tear. As a result, the pt has been advance to clear liquids. She will very slowly advance her diet toward a pureed diet over the next 49-72hrs.   Hospital Course:   Food impaction of esophagus  As per GI - favorable Gastrografin esophagram 6/15 - pain is improving - clear liquids per GI for another 48 hrs followed by pureed food.    Concern for esophageal perforation during disimpaction  The patient had a favorable Gastrografin esophagram - she was receiving Cefuroxime for a possible perforation which has been discontinued today   Hypothyroidism  TSH at goal   Left parietal corona radiata/centrum semiovale infarct 06/28/2012  R hemiparesis + dysphagia - etiology felt to be small vessel disease - plavix daily will need to be resumed as soon as patient can tolerate oral medications/when tolerating at least a full liquid diet (had been on ASA 81mg  at time of event) - resume PT/OT/SLP   Type II or unspecified type diabetes mellitus  CBG well controlled at present - appears to be diet controlled at home   Unspecified essential hypertension  Blood pressure is well-controlled at this time   Atrioventricular block, complete - s/p PPM 2003 (changed 2011)   Macular degeneration    Procedures: 6/14 Rigid esophagoscopy with removal of impacted food- ENT   Consultations:  GI  ENT  Discharge Exam: Filed Vitals:   08/18/12 2356 08/19/12 0345 08/19/12 0403 08/19/12 0934  BP:  153/74  146/68  Pulse:  92  89  Temp: 97.6 F (36.4 C)  98.8 F (37.1 C) 98.1 F (36.7 C)  TempSrc: Oral  Oral Oral  Resp:  17  20  Height:      Weight:      SpO2:  96%  98%    General:  AAO x 3, no distress Cardiovascular: RRR, no murmurs Respiratory: CTA b/l   Discharge Instructions      Discharge Orders   Future Appointments Provider Department Dept Phone   09/16/2012 2:00 PM Ronal Fear, NP [facility] [phone]   Future Orders Complete By Expires     Increase activity slowly  As directed         Medication List    STOP taking these medications       clopidogrel 75 MG tablet  Commonly known as:  PLAVIX      TAKE these  medications       acetaminophen 650 MG CR tablet  Commonly known as:  TYLENOL  Take 650 mg by mouth 2 (two) times daily.     antiseptic oral rinse Liqd  15 mLs by Mouth Rinse route as directed. Before meals and at bedtime     CALMOSEPTINE 0.44-20.625 % Oint  Generic drug:  Menthol-Zinc Oxide  Apply 1 application topically as needed. Apply to peri-anal skin and inner buttox after each incontinent episode and PRN     cetirizine 10 MG tablet  Commonly known as:  ZYRTEC  Take 10 mg by mouth daily as needed for allergies.     escitalopram 20 MG tablet  Commonly known as:  LEXAPRO  Take 20 mg by mouth daily.     levothyroxine 75 MCG tablet  Commonly known as:  SYNTHROID, LEVOTHROID  Take 75 mcg by mouth daily before breakfast.     liver oil-zinc oxide 40 % ointment  Commonly known as:  DESITIN  Apply 1 application topically as needed. Apply to rash on buttox/inner thighs after each BM     losartan 50 MG tablet  Commonly known as:  COZAAR  Take 1 tablet (50 mg total) by mouth daily.     multivitamin with minerals tablet  Take 1 tablet by mouth daily.     potassium chloride SA 20 MEQ tablet  Commonly known as:  K-DUR,KLOR-CON  1 daily for potassium supplement. May crush and sprinkle on food.       No Known Allergies    The results of significant diagnostics from this hospitalization (including imaging, microbiology, ancillary and laboratory) are listed below for reference.    Significant Diagnostic Studies: Dg Swallowing Func-speech Pathology  08/15/2012   Breck Coons Dunn, CCC-SLP     08/15/2012 12:52 PM Objective Swallowing Evaluation: Modified Barium Swallowing Study   Patient Details  Name: NYOMIE EHRLICH MRN: [id number] Date of Birth: April 25, 1919  Today's Date: 08/15/2012 Time: 1100-     Past Medical History:  Past Medical History  Diagnosis Date  . Cancer   . Thyroid disease   . Hypertension   . High cholesterol   . Dendritic keratitis 2012  . Malignant neoplasm of  breast (female), unspecified site 1970    S/P Rt radical mastectomy  . Unspecified hypothyroidism 2009  . Type II or unspecified type diabetes mellitus without mention  of complication, not stated as uncontrolled 2003  . Unspecified vitamin D deficiency 2009  . Pure hyperglyceridemia 2004  . Anxiety state, unspecified   . Macular degeneration (senile) of retina, unspecified 2012  . Unspecified glaucoma(365.9) 2013  . Tinnitus 2007  . Unspecified essential hypertension 2000  . Coronary atherosclerosis of native coronary artery 2003    non obstructive by Cath 2003  . Atrioventricular block, complete 2003    s/p PPM 2003, generator chnage 2011  . Allergic rhinitis due to pollen   .  Acquired cyst of kidney 2002  . Osteoarthrosis, unspecified whether generalized or localized,  unspecified site 2013  . Unspecified arthropathy, pelvic region and thigh 2010  . Senile osteoporosis 2003  . Abnormality of gait 2007  . Edema 2003  . Personal history of colonic polyps      s/p colonoscopy/polypectomy 2003  . Cardiac pacemaker in situ 2003  . CVA (cerebral infarction) 06/28/2012    rt hemiparesis, dysphagia  . NSTEMI (non-ST elevated myocardial infarction) 06/28/2012  . Urinary retention 07/07/2012   Past Surgical History:  Past Surgical History  Procedure Laterality Date  . Mastectomy Right 1970    Cancer   HPI:  79 yr old from [facility] accompanied by son, caregiver and SLP.   PMH of CVA 06/2012, hypertension, dyslipidemia, status post  permanent pacemaker placement.  In 06/2012 pt. was discharged from  hospital on modified diet texture and nectar thick liquids.  Pt.  received ST and was able to upgrade to regular texture diet and  thin liquids.  Two days ago pt. began to have acute swallowing  difficulties with regurgition and significant pharyngeal globus  sensation and has been unable to eat/ddrink x 2 days.         Assessment / Plan / Recommendation Clinical Impression  Dysphagia Diagnosis: Suspected primary esophageal   dysphagia;Severe cervical esophageal phase dysphagia Clinical impression: MBS completed with pt. initially consuming  sip of thin barium resulting in severe stasis in  cervical/proximal esophagus with significant and forceful  backflow into pharynx , laryngeal vestibule and belching.  Pt.  performed reflexive multiple swallows in attempts to clear  residual that was ineffective.  Radiologist was called into xray  suite, viewed esophagus during swallows of thin barium and   suspects pt. has an acutely impacted esophagus and recommended  either CT or EGD.  This SLP spoke with Dr. Chilton Si who wants pt.  admitted to hospital today.   SLP also spoke with Jani Gravel, NP  who stated she would call hospital and have pt. directly  admitted.  This SLP recommends, GI consult to intervene with this  acute severe cervical and suspected esophageal dysphagia.        Treatment Recommendation   (Pt. directly admitted from hospital, will need new order)    Diet Recommendation NPO        Other  Recommendations Recommended Consults: Consider GI  evaluation   Follow Up Recommendations       Frequency and Duration        Pertinent Vitals/Pain none    SLP Swallow Goals     General HPI: 79 yr old from [facility] accompanied by son,  caregiver and SLP.  PMH of CVA 06/2012, hypertension,  dyslipidemia, status post permanent pacemaker placement.  In  06/2012 pt. was discharged from hospital on modified diet texture  and nectar thick liquids.  Pt. received ST and was able to  upgrade to regular texture diet and thin liquids.  Two days ago  pt. began to have acute swallowing difficulties with regurgition  and significant pharyngeal globus sensation and has been unable  to eat/ddrink x 2 days.     Type of Study: Modified Barium Swallowing Study Reason for Referral: Objectively evaluate swallowing function Previous Swallow Assessment:  (no MBS) Diet Prior to this Study: Regular;Thin liquids Temperature Spikes Noted: No Respiratory Status: Room air  History of Recent Intubation: No Behavior/Cognition: Alert;Cooperative;Pleasant mood Oral Cavity - Dentition: Dentures, top;Dentures, bottom Self-Feeding Abilities: Able to feed self Patient Positioning: Upright in  chair Baseline Vocal Quality: Clear Volitional Cough: Strong Volitional Swallow: Able to elicit Anatomy: Within functional limits Pharyngeal Secretions: Not observed secondary MBS    Reason for Referral Objectively evaluate swallowing function   Oral Phase Oral Preparation/Oral Phase Oral Phase: WFL   Pharyngeal Phase Pharyngeal Phase Pharyngeal Phase: Within functional limits  Cervical Esophageal Phase    GO    Cervical Esophageal Phase Cervical Esophageal Phase: Impaired Cervical Esophageal Phase - Comment Cervical Esophageal Comment:  (significant stasis in proximal  esophagus)         Royce Macadamia 08/15/2012, 12:52 PM    Dg Esophagus W/water Marchelle Folks Cm  08/17/2012   *RADIOLOGY REPORT*  Clinical Data: Recent region esophagoscopy for impaction. Esophageal tear.  Evaluate for esophageal perforation.  ESOPHOGRAM/BARIUM SWALLOW  Technique:  Single contrast examination was performed using water- soluble Omnipaque-300.  Fluoroscopy time:  2 minutes 28 seconds  Comparison:  08/15/2012  Findings:  The patient was able to sip small swallows of Omnipaque. Slow but adequate filling of the esophagus is seen, and there is no evidence of esophageal leak or perforation.  No residual filling defects seen within the esophageal lumen.  Esophageal dysmotility is demonstrated, with tertiary contractions seen in the distal esophagus, however there is no evidence of esophageal stricture, mass, or dilatation.  No evidence of hiatal hernia.  IMPRESSION:  1.  No evidence of esophageal perforation or obstruction. 2.  Esophageal dysmotility with tertiary contractions in the distal esophagus.   Original Report Authenticated By: Myles Rosenthal, M.D.    Microbiology: Recent Results (from the past 240 hour(s))  MRSA PCR  SCREENING     Status: None   Collection Time    08/16/12  3:14 AM      Result Value Range Status   MRSA by PCR NEGATIVE  NEGATIVE Final   Comment:            The GeneXpert MRSA Assay (FDA     approved for NASAL specimens     only), is one component of a     comprehensive MRSA colonization     surveillance program. It is not     intended to diagnose MRSA     infection nor to guide or     monitor treatment for     MRSA infections.     Labs: Basic Metabolic Panel:  Recent Labs Lab 08/15/12 2054 08/17/12 0416 08/18/12 0505  NA 137 141 142  K 4.7 3.5 3.5  CL 102 104 105  CO2 21 30 31   GLUCOSE 124* 131* 124*  BUN 6 6 5*  CREATININE 0.40* 0.54 0.50  CALCIUM 9.5 8.9 8.8   Liver Function Tests:  Recent Labs Lab 08/15/12 2054 08/18/12 0505  AST 60* 25  ALT 28 15  ALKPHOS 49 43  BILITOT 0.5 0.5  PROT 7.2 6.2  ALBUMIN 3.6 2.7*   No results found for this basename: LIPASE, AMYLASE,  in the last 168 hours No results found for this basename: AMMONIA,  in the last 168 hours CBC:  Recent Labs Lab 08/16/12 0050 08/17/12 0416 08/18/12 0505  WBC 14.7* 8.5 7.6  NEUTROABS 11.5* 5.3 4.4  HGB 13.3 11.8* 12.0  HCT 39.0 35.5* 36.6  MCV 91.5 93.9 95.1  PLT 184 172 165   Cardiac Enzymes: No results found for this basename: CKTOTAL, CKMB, CKMBINDEX, TROPONINI,  in the last 168 hours BNP: BNP (last 3 results) No results found for this basename: PROBNP,  in the last 8760 hours CBG:  Recent Labs Lab 08/18/12 1948 08/18/12 2346 08/19/12 0344 08/19/12 0732 08/19/12 1216  GLUCAP 116* 124* 137* 126* 131*       Signed:  Sankalp Ferrell  [facility] 08/19/2012, 2:07 PM

## 2012-08-19 NOTE — Progress Notes (Signed)
Transferred patient to 6 N 22 per hospital bed.  She is alert and oriented to self.  Son-in-law at bedside during this transfer.

## 2012-08-19 NOTE — Clinical Social Work Psychosocial (Addendum)
    Clinical Social Work Department BRIEF PSYCHOSOCIAL ASSESSMENT 08/19/2012  Patient:  Rachel Cooke, Rachel Cooke     Account Number:  [id number]     Admit date:  08/15/2012  Clinical Social Worker:  Hulan Fray  Date/Time:  08/19/2012 10:22 AM  Referred by:  CSW  Date Referred:  08/19/2012 Referred for  SNF Placement   Other Referral:   Interview type:  Family Other interview type:   Carlene Coria- son in law    PSYCHOSOCIAL DATA Living Status:  FACILITY Admitted from facility:  [facility] Level of care:  Skilled Nursing Facility Primary support name:  Dois Davenport and Carlene Coria Primary support relationship to patient:  CHILD, ADULT Degree of support available:   Daughter and son-in-law are very supportive    CURRENT CONCERNS Current Concerns  None Noted   Other Concerns:    SOCIAL WORK ASSESSMENT / PLAN Clinical Social Worker received referral for patient being from [facility]. CSW introduced self and explained reason for visit. Patient's son-in-law was at bedside. Per son, patient was progressing well at [facility] receiving rehab after suffering a stroke. Son reported that plan will be for patient to return to [facility] when medically stable.    CSW spoke with Durward Mallard, admissions coordinator at [facility] [phone]) and she reported patient can return when medically stable. Durward Mallard reported that no pasarr number was needed. CSW will complete FL2 for MD's signature and facilitate discharge back to facility when medically stable.   Assessment/plan status:  Psychosocial Support/Ongoing Assessment of Needs Other assessment/ plan:   14:36pm CSW facilitated discharge by contacting facility and daughter, was at bedside. Daughter had concerns about patient's wheelchair. CSW called [facility] and they are not able to transport patient with wheelchair. CSW called facility and they will arrange for their security to pick up wheelchair at front of unit. CSW will sign off, as social work  intervention is no longer needed.  Information/referral to community resources:   Patient is from [facility]    PATIENT'S/FAMILY'S RESPONSE TO PLAN OF CARE: Son-in-law was appreciative of CSW's visit and assistance. Son-in-law reported that plan is for patient to return back to [facility] at discharge.

## 2012-08-19 NOTE — Progress Notes (Signed)
Patient ID: Rachel Cooke, female   DOB: [birth year], 79 y.o.   MRN: [id number] [facility] (31) Code Status: DNR  Allergies  Allergen Reactions  . Lactose Intolerance (Gi)     Chief Complaint:  Chief Complaint  Patient presents with  . Hospitalization Follow-up    Esophageal impaction, dysmotility     HPI: This 79 year old female resident of [facility], Skilled Nursing section was hospitalized 08/15/2012 due to esophageal impaction. 2 days prior to admission patient had sudden inability to swallow her food. A barium swallow study June 13 revealed an esophageal impaction. Dr. Ewing Schlein was consulted, he attempted to remove the impaction via EGD, this was unsuccessful. He consulted Dr.Teoh of ENT who agreed rigid esophagoscopy under GETA was indicated. Pt. Was brought to the OR, Dr.Teoh proceeded with rigid esophagoscopy and removal of extensive food impaction of upper and  mid esophagus, Dr. Ewing Schlein completed the procedure with EGD of distal esophagus. There was some concern of an mucosal tear. Pt. Was kept NPO except ice chips, on 08/17/12 esophagram was negative for mucosal tear, noted esophageal dysmotility with tertiary contractions throughout. Diet was advanced to clear liquids, then to full liquids today. Pt. tolerated small amounts per family report. Pt. Did not receive any PO meds during hospitalization; DM was controlled with SSI coverage, she was given IV Protonix, no antihypertensive medication was required. Family notes pt had episodes of confusion and agitation re: morphine. Patient was d/c back to Skilled nursing section at [facility] this afternoon.  Functional status Bathing: Dependent Diet / Swallowing: PO Diet: Full Liquid, Feeding: Supervision Hygiene and Grooming: Maximal Assist, Sit to Stand: Dependent Toileting / Clothing: Maximal Assist Transfers: Dependent   Past Medical History  Diagnosis Date  . Cancer   . Thyroid  disease   . Hypertension   . High cholesterol   . Dendritic keratitis 2012  . Malignant neoplasm of breast (female), unspecified site 1970    S/P Rt radical mastectomy  . Unspecified hypothyroidism 2009  . Type II or unspecified type diabetes mellitus without mention of complication, not stated as uncontrolled 2003  . Unspecified vitamin D deficiency 2009  . Pure hyperglyceridemia 2004  . Anxiety state, unspecified   . Macular degeneration (senile) of retina, unspecified 2012  . Unspecified glaucoma(365.9) 2013  . Tinnitus 2007  . Unspecified essential hypertension 2000  . Coronary atherosclerosis of native coronary artery 2003    non obstructive by Cath 2003  . Atrioventricular block, complete 2003    s/p PPM 2003, generator chnage 2011  . Allergic rhinitis due to pollen   . Acquired cyst of kidney 2002  . Osteoarthrosis, unspecified whether generalized or localized, unspecified site 2013  . Unspecified arthropathy, pelvic region and thigh 2010  . Senile osteoporosis 2003  . Abnormality of gait 2007  . Edema 2003  . Personal history of colonic polyps      s/p colonoscopy/polypectomy 2003  . Cardiac pacemaker in situ 2003  . CVA (cerebral infarction) 06/28/2012    rt hemiparesis, dysphagia  . NSTEMI (non-ST elevated myocardial infarction) 06/28/2012  . Urinary retention 07/07/2012  . Pacemaker   . Stroke 06/28/2012  . Food impaction of esophagus 08/15/2012  . Esophageal dysmotility 08/19/2012   Past Surgical History  Procedure Laterality Date  . Mastectomy Right 1970    Cancer  . Insert / replace / remove pacemaker    . Esophagogastroduodenoscopy N/A 08/15/2012    Procedure: ESOPHAGOGASTRODUODENOSCOPY (EGD);  Surgeon: Petra Kuba, MD;  Location:  [facility];  Service: Endoscopy;  Laterality: N/A;  . Esophagoscopy N/A 08/15/2012    Procedure: ESOPHAGOSCOPY;  Surgeon: Darletta Moll, MD;  Location: [facility] OR;  Service: ENT;  Laterality: N/A;  . Foreign body removal esophageal N/A  08/15/2012    Procedure: REMOVAL FOREIGN BODY ESOPHAGEAL;  Surgeon: Darletta Moll, MD;  Location: [facility] OR;  Service: ENT;  Laterality: N/A;  . Esophagogastroduodenoscopy N/A 08/15/2012    Procedure: ESOPHAGOGASTRODUODENOSCOPY (EGD);  Surgeon: Petra Kuba, MD;  Location: [facility] OR;  Service: Endoscopy;  Laterality: N/A;   Social History:    reports that she has never smoked. She does not have any smokeless tobacco history on file. She reports that she does not drink alcohol. Her drug history is not on file.  No family history on file.  Medications: Review of record shows patient did not receive any oral medications during hospitalization   ACETAMINOPHEN (TYLENOL) 650 MG CR TABLET    Take 650 mg by mouth 2 (two) times daily.   ANTISEPTIC ORAL RINSE (BIOTENE) LIQD    15 mLs by Mouth Rinse route as directed. Before meals and at bedtime   CETIRIZINE (ZYRTEC) 10 MG TABLET    Take 10 mg by mouth daily as needed for allergies.   ESCITALOPRAM (LEXAPRO) 20 MG TABLET    Take 20 mg by mouth daily.   LEVOTHYROXINE (SYNTHROID, LEVOTHROID) 75 MCG TABLET    Take 75 mcg by mouth daily before breakfast.   LIVER OIL-ZINC OXIDE (DESITIN) 40 % OINTMENT    Apply 1 application topically as needed. Apply to rash on buttox/inner thighs after each BM   LOSARTAN (COZAAR) 50 MG TABLET    Take 1 tablet (50 mg total) by mouth daily.   MENTHOL-ZINC OXIDE (CALMOSEPTINE) 0.44-20.625 % OINT    Apply 1 application topically as needed. Apply to peri-anal skin and inner buttox after each incontinent episode and PRN   MULTIPLE VITAMINS-MINERALS (MULTIVITAMIN WITH MINERALS) TABLET    Take 1 tablet by mouth daily.   POTASSIUM CHLORIDE SA (K-DUR,KLOR-CON) 20 MEQ TABLET    1 daily for potassium supplement. May crush and sprinkle on food.    Review of Systems:  DATA OBTAINED: from patient, medical record,  family member (son in law, [place]) GENERAL: Feels tired, worn out, glad to be back home    SKIN: No itch, rash or open wounds EYES: No eye  pain, dryness or itching  No change in vision EARS: No earache, change in hearing NOSE: No congestion, drainage or bleeding MOUTH/THROAT: Sore upper lip No mouth or tooth pain  Mild sore throat   RESPIRATORY: No cough, wheezing, SOB CARDIAC: No chest pain, palpitations  No edema. GI: No abdominal pain  No N/V/D or constipation   GU: No dysuria, frequency or urgency  No change in urine volume or character  MUSCULOSKELETAL: No joint pain, swelling or stiffness  No back pain  No muscle ache, pain   NEUROLOGIC: No dizziness, fainting, headache, No change in mental status.  Rt hand, arm and leg are weaker PSYCHIATRIC: No feelings of anxiety, depression Sleeps well.    Physical Exam: Filed Vitals:   08/19/12 2126  BP: 179/88  SpO2: 88%    GENERAL APPEARANCE: No acute distress, appropriately groomed, Overweight body habitus. Alert, pleasant, less conversant than usual. HEAD: Normocephalic, atraumatic EYES: Conjunctiva/lids clear. Pupils round, reactive.  EARS: Decreased Hearing  NOSE: No deformity or discharge. MOUTH/THROAT: Upper lip swollen, cracked Oral mucosa, tongue moist, w/o lesion.   LYMPHATICS: No  head, neck or supraclavicular adenopathy RESPIRATORY: Breathing is even, unlabored. Lung sounds are clear and full.  CARDIOVASCULAR: Heart RRR. No murmur or extra heart sounds  EDEMA: No peripheral  edema.  GASTROINTESTINAL: Abdomen is soft, non-tender, not distended w/ normal bowel sounds.  MUSCULOSKELETAL: Moves Left extremities with full ROM, strength and tone. Right arm weakness, left leg weakness NEUROLOGIC: Oriented to place, person. Speech clear, no tremor.  PSYCHIATRIC: Mood and affect appropriate to situation  Labs reviewed:   08/15/12 2054 08/17/12 0416 08/18/12 0505  NA 137 141 142  K 4.7 3.5 3.5  CL 102 104 105  CO2 21 30 31   GLUCOSE 124* 131* 124*  BUN 6 6 5*  CREATININE 0.40* 0.54 0.50  CALCIUM 9.5 8.9 8.8     08/15/12 2054 08/18/12 0505  AST 60* 25  ALT  28 15  ALKPHOS 49 43  BILITOT 0.5 0.5  PROT 7.2 6.2  ALBUMIN 3.6 2.7*     08/16/12 0050 08/17/12 0416 08/18/12 0505  WBC 14.7* 8.5 7.6  NEUTROABS 11.5* 5.3 4.4  HGB 13.3 11.8* 12.0  HCT 39.0 35.5* 36.6  MCV 91.5 93.9 95.1  PLT 184 172 165     06/28/12 1640 06/28/12 2200 06/29/12 0624  TROPONINI 0.75* 1.22* 0.77*   CBG:   08/19/12 0344 08/19/12 0732 08/19/12 1216  GLUCAP 137* 126* 131*      Assessment/Plan   Type II or unspecified type diabetes mellitus without mention of complication, not stated as uncontrolled Review of hospital records shows blood sugar was mildly elevated during hospitalization. Patient was treated with sliding scale insulin. Will monitor daily fasting blood sugars, treat as needed.  Food impaction of esophagus Extensive esophageal food impaction, status post a soft rigid esophagoscopy and EGD. Patient's current diet is full liquid, speech therapy will evaluate and coordinate any diet advancement. Dr. Ewing Schlein expects this patient will only advance only to pured consistency. Will not resume PO medication yet.   CVA (cerebral infarction) Acute episode/ hospitalization has resulted in loss of some progress made since CVA. Resume PT/OT to regain function Rt. Arm / leg   Bathing: Dependent Diet / Swallowing: PO Diet: Full Liquid, Feeding: Supervision Hygiene and Grooming: Maximal Assist, Sit to Stand: Dependent Toileting / Clothing: Maximal Assist Transfers: Dependent  Family/ staff Communication: Reviewed hospital events with pt's daughter/ son in law. They understand PO intake will be slow, limited, monitored by ST. May need to resume IVF if pt. is not able to consume enough fluids. They agree with quick resumption of PT/OT.  Goals of care: To progress to highest functional status capable. Anticipate permanent Skilled level of  care   >45 minutes spent with pt, family and nursing staff  Clarabel Marion T.Taft Worthing, NP-C 08/19/2012

## 2012-08-19 NOTE — Progress Notes (Signed)
Rachel Cooke 9:19 AM  Subjective: Patient's throat is still sore but not as bad as it was and she is eating okay with assistance and no new complaint  Objective: Vital signs stable afebrile no acute distress  Assessment: Feeding problem  Plan: A day or 2 of full liquids and then purreed food and rediscussed case with son-in-law [facility]

## 2012-08-19 NOTE — Progress Notes (Signed)
Pt was d/c'd via ambulance transport to [facility]. Pt d/c'd with belongings, daughter present at bedside. Report called to staff nurse, Onalee Hua at [facility]. Packet of information sent with transporters.

## 2012-08-20 DIAGNOSIS — R1314 Dysphagia, pharyngoesophageal phase: Secondary | ICD-10-CM | POA: Diagnosis not present

## 2012-08-20 DIAGNOSIS — I69991 Dysphagia following unspecified cerebrovascular disease: Secondary | ICD-10-CM | POA: Diagnosis not present

## 2012-08-20 DIAGNOSIS — I69919 Unspecified symptoms and signs involving cognitive functions following unspecified cerebrovascular disease: Secondary | ICD-10-CM | POA: Diagnosis not present

## 2012-08-21 ENCOUNTER — Encounter: Payer: Self-pay | Admitting: Geriatric Medicine

## 2012-08-21 DIAGNOSIS — M6281 Muscle weakness (generalized): Secondary | ICD-10-CM | POA: Diagnosis not present

## 2012-08-21 DIAGNOSIS — R279 Unspecified lack of coordination: Secondary | ICD-10-CM | POA: Diagnosis not present

## 2012-08-21 DIAGNOSIS — I69959 Hemiplegia and hemiparesis following unspecified cerebrovascular disease affecting unspecified side: Secondary | ICD-10-CM | POA: Diagnosis not present

## 2012-08-21 DIAGNOSIS — G8190 Hemiplegia, unspecified affecting unspecified side: Secondary | ICD-10-CM | POA: Diagnosis not present

## 2012-08-21 DIAGNOSIS — I69919 Unspecified symptoms and signs involving cognitive functions following unspecified cerebrovascular disease: Secondary | ICD-10-CM | POA: Diagnosis not present

## 2012-08-21 DIAGNOSIS — R269 Unspecified abnormalities of gait and mobility: Secondary | ICD-10-CM | POA: Diagnosis not present

## 2012-08-21 DIAGNOSIS — R1314 Dysphagia, pharyngoesophageal phase: Secondary | ICD-10-CM | POA: Diagnosis not present

## 2012-08-21 DIAGNOSIS — I69991 Dysphagia following unspecified cerebrovascular disease: Secondary | ICD-10-CM | POA: Diagnosis not present

## 2012-08-21 DIAGNOSIS — E018 Other iodine-deficiency related thyroid disorders and allied conditions: Secondary | ICD-10-CM | POA: Diagnosis not present

## 2012-08-21 DIAGNOSIS — I69993 Ataxia following unspecified cerebrovascular disease: Secondary | ICD-10-CM | POA: Diagnosis not present

## 2012-08-21 DIAGNOSIS — R609 Edema, unspecified: Secondary | ICD-10-CM | POA: Diagnosis not present

## 2012-08-21 DIAGNOSIS — I10 Essential (primary) hypertension: Secondary | ICD-10-CM | POA: Diagnosis not present

## 2012-08-21 NOTE — Assessment & Plan Note (Signed)
Acute episode/ hospitalization has resulted in loss of some progress made since CVA. Resume PT/OT to regain function Rt. Arm / leg

## 2012-08-21 NOTE — Assessment & Plan Note (Addendum)
Extensive esophageal food impaction, status post a soft rigid esophagoscopy and EGD. Patient's current diet is full liquid, speech therapy will evaluate and coordinate any diet advancement. Dr. Ewing Schlein expects this patient will only advance only to pured consistency. Will not resume PO medication yet.

## 2012-08-21 NOTE — Assessment & Plan Note (Addendum)
Review of hospital records shows blood sugar was mildly elevated during hospitalization. Patient was treated with sliding scale insulin. Will monitor daily fasting blood sugars, treat as needed.

## 2012-08-22 DIAGNOSIS — I69919 Unspecified symptoms and signs involving cognitive functions following unspecified cerebrovascular disease: Secondary | ICD-10-CM | POA: Diagnosis not present

## 2012-08-22 DIAGNOSIS — M6281 Muscle weakness (generalized): Secondary | ICD-10-CM | POA: Diagnosis not present

## 2012-08-22 DIAGNOSIS — G8190 Hemiplegia, unspecified affecting unspecified side: Secondary | ICD-10-CM | POA: Diagnosis not present

## 2012-08-22 DIAGNOSIS — I69991 Dysphagia following unspecified cerebrovascular disease: Secondary | ICD-10-CM | POA: Diagnosis not present

## 2012-08-22 DIAGNOSIS — I69993 Ataxia following unspecified cerebrovascular disease: Secondary | ICD-10-CM | POA: Diagnosis not present

## 2012-08-22 DIAGNOSIS — I69959 Hemiplegia and hemiparesis following unspecified cerebrovascular disease affecting unspecified side: Secondary | ICD-10-CM | POA: Diagnosis not present

## 2012-08-23 DIAGNOSIS — I69991 Dysphagia following unspecified cerebrovascular disease: Secondary | ICD-10-CM | POA: Diagnosis not present

## 2012-08-23 DIAGNOSIS — I69993 Ataxia following unspecified cerebrovascular disease: Secondary | ICD-10-CM | POA: Diagnosis not present

## 2012-08-23 DIAGNOSIS — M6281 Muscle weakness (generalized): Secondary | ICD-10-CM | POA: Diagnosis not present

## 2012-08-23 DIAGNOSIS — G8190 Hemiplegia, unspecified affecting unspecified side: Secondary | ICD-10-CM | POA: Diagnosis not present

## 2012-08-23 DIAGNOSIS — I69919 Unspecified symptoms and signs involving cognitive functions following unspecified cerebrovascular disease: Secondary | ICD-10-CM | POA: Diagnosis not present

## 2012-08-23 DIAGNOSIS — I69959 Hemiplegia and hemiparesis following unspecified cerebrovascular disease affecting unspecified side: Secondary | ICD-10-CM | POA: Diagnosis not present

## 2012-08-25 DIAGNOSIS — M6281 Muscle weakness (generalized): Secondary | ICD-10-CM | POA: Diagnosis not present

## 2012-08-25 DIAGNOSIS — I69919 Unspecified symptoms and signs involving cognitive functions following unspecified cerebrovascular disease: Secondary | ICD-10-CM | POA: Diagnosis not present

## 2012-08-25 DIAGNOSIS — G8190 Hemiplegia, unspecified affecting unspecified side: Secondary | ICD-10-CM | POA: Diagnosis not present

## 2012-08-25 DIAGNOSIS — I69959 Hemiplegia and hemiparesis following unspecified cerebrovascular disease affecting unspecified side: Secondary | ICD-10-CM | POA: Diagnosis not present

## 2012-08-25 DIAGNOSIS — I69991 Dysphagia following unspecified cerebrovascular disease: Secondary | ICD-10-CM | POA: Diagnosis not present

## 2012-08-25 DIAGNOSIS — I69993 Ataxia following unspecified cerebrovascular disease: Secondary | ICD-10-CM | POA: Diagnosis not present

## 2012-08-26 DIAGNOSIS — I69959 Hemiplegia and hemiparesis following unspecified cerebrovascular disease affecting unspecified side: Secondary | ICD-10-CM | POA: Diagnosis not present

## 2012-08-26 DIAGNOSIS — I69919 Unspecified symptoms and signs involving cognitive functions following unspecified cerebrovascular disease: Secondary | ICD-10-CM | POA: Diagnosis not present

## 2012-08-26 DIAGNOSIS — G8190 Hemiplegia, unspecified affecting unspecified side: Secondary | ICD-10-CM | POA: Diagnosis not present

## 2012-08-26 DIAGNOSIS — I69993 Ataxia following unspecified cerebrovascular disease: Secondary | ICD-10-CM | POA: Diagnosis not present

## 2012-08-26 DIAGNOSIS — I69991 Dysphagia following unspecified cerebrovascular disease: Secondary | ICD-10-CM | POA: Diagnosis not present

## 2012-08-26 DIAGNOSIS — M6281 Muscle weakness (generalized): Secondary | ICD-10-CM | POA: Diagnosis not present

## 2012-08-27 DIAGNOSIS — M6281 Muscle weakness (generalized): Secondary | ICD-10-CM | POA: Diagnosis not present

## 2012-08-27 DIAGNOSIS — G8190 Hemiplegia, unspecified affecting unspecified side: Secondary | ICD-10-CM | POA: Diagnosis not present

## 2012-08-27 DIAGNOSIS — I69991 Dysphagia following unspecified cerebrovascular disease: Secondary | ICD-10-CM | POA: Diagnosis not present

## 2012-08-27 DIAGNOSIS — I69959 Hemiplegia and hemiparesis following unspecified cerebrovascular disease affecting unspecified side: Secondary | ICD-10-CM | POA: Diagnosis not present

## 2012-08-27 DIAGNOSIS — I69993 Ataxia following unspecified cerebrovascular disease: Secondary | ICD-10-CM | POA: Diagnosis not present

## 2012-08-27 DIAGNOSIS — I69919 Unspecified symptoms and signs involving cognitive functions following unspecified cerebrovascular disease: Secondary | ICD-10-CM | POA: Diagnosis not present

## 2012-08-28 DIAGNOSIS — I69959 Hemiplegia and hemiparesis following unspecified cerebrovascular disease affecting unspecified side: Secondary | ICD-10-CM | POA: Diagnosis not present

## 2012-08-28 DIAGNOSIS — M6281 Muscle weakness (generalized): Secondary | ICD-10-CM | POA: Diagnosis not present

## 2012-08-28 DIAGNOSIS — I69919 Unspecified symptoms and signs involving cognitive functions following unspecified cerebrovascular disease: Secondary | ICD-10-CM | POA: Diagnosis not present

## 2012-08-28 DIAGNOSIS — I69991 Dysphagia following unspecified cerebrovascular disease: Secondary | ICD-10-CM | POA: Diagnosis not present

## 2012-08-28 DIAGNOSIS — I69993 Ataxia following unspecified cerebrovascular disease: Secondary | ICD-10-CM | POA: Diagnosis not present

## 2012-08-28 DIAGNOSIS — G8190 Hemiplegia, unspecified affecting unspecified side: Secondary | ICD-10-CM | POA: Diagnosis not present

## 2012-08-29 DIAGNOSIS — I69993 Ataxia following unspecified cerebrovascular disease: Secondary | ICD-10-CM | POA: Diagnosis not present

## 2012-08-29 DIAGNOSIS — I69959 Hemiplegia and hemiparesis following unspecified cerebrovascular disease affecting unspecified side: Secondary | ICD-10-CM | POA: Diagnosis not present

## 2012-08-29 DIAGNOSIS — G8190 Hemiplegia, unspecified affecting unspecified side: Secondary | ICD-10-CM | POA: Diagnosis not present

## 2012-08-29 DIAGNOSIS — M6281 Muscle weakness (generalized): Secondary | ICD-10-CM | POA: Diagnosis not present

## 2012-08-29 DIAGNOSIS — I69991 Dysphagia following unspecified cerebrovascular disease: Secondary | ICD-10-CM | POA: Diagnosis not present

## 2012-08-29 DIAGNOSIS — I69919 Unspecified symptoms and signs involving cognitive functions following unspecified cerebrovascular disease: Secondary | ICD-10-CM | POA: Diagnosis not present

## 2012-09-01 DIAGNOSIS — G8190 Hemiplegia, unspecified affecting unspecified side: Secondary | ICD-10-CM | POA: Diagnosis not present

## 2012-09-01 DIAGNOSIS — I69991 Dysphagia following unspecified cerebrovascular disease: Secondary | ICD-10-CM | POA: Diagnosis not present

## 2012-09-01 DIAGNOSIS — M6281 Muscle weakness (generalized): Secondary | ICD-10-CM | POA: Diagnosis not present

## 2012-09-01 DIAGNOSIS — I69959 Hemiplegia and hemiparesis following unspecified cerebrovascular disease affecting unspecified side: Secondary | ICD-10-CM | POA: Diagnosis not present

## 2012-09-01 DIAGNOSIS — I69919 Unspecified symptoms and signs involving cognitive functions following unspecified cerebrovascular disease: Secondary | ICD-10-CM | POA: Diagnosis not present

## 2012-09-01 DIAGNOSIS — I69993 Ataxia following unspecified cerebrovascular disease: Secondary | ICD-10-CM | POA: Diagnosis not present

## 2012-09-02 DIAGNOSIS — I69991 Dysphagia following unspecified cerebrovascular disease: Secondary | ICD-10-CM | POA: Diagnosis not present

## 2012-09-02 DIAGNOSIS — G8190 Hemiplegia, unspecified affecting unspecified side: Secondary | ICD-10-CM | POA: Diagnosis not present

## 2012-09-02 DIAGNOSIS — R279 Unspecified lack of coordination: Secondary | ICD-10-CM | POA: Diagnosis not present

## 2012-09-02 DIAGNOSIS — R609 Edema, unspecified: Secondary | ICD-10-CM | POA: Diagnosis not present

## 2012-09-02 DIAGNOSIS — R1314 Dysphagia, pharyngoesophageal phase: Secondary | ICD-10-CM | POA: Diagnosis not present

## 2012-09-02 DIAGNOSIS — R269 Unspecified abnormalities of gait and mobility: Secondary | ICD-10-CM | POA: Diagnosis not present

## 2012-09-02 DIAGNOSIS — I69959 Hemiplegia and hemiparesis following unspecified cerebrovascular disease affecting unspecified side: Secondary | ICD-10-CM | POA: Diagnosis not present

## 2012-09-02 DIAGNOSIS — I69993 Ataxia following unspecified cerebrovascular disease: Secondary | ICD-10-CM | POA: Diagnosis not present

## 2012-09-02 DIAGNOSIS — M6281 Muscle weakness (generalized): Secondary | ICD-10-CM | POA: Diagnosis not present

## 2012-09-02 DIAGNOSIS — I69919 Unspecified symptoms and signs involving cognitive functions following unspecified cerebrovascular disease: Secondary | ICD-10-CM | POA: Diagnosis not present

## 2012-09-03 DIAGNOSIS — I69993 Ataxia following unspecified cerebrovascular disease: Secondary | ICD-10-CM | POA: Diagnosis not present

## 2012-09-03 DIAGNOSIS — I69959 Hemiplegia and hemiparesis following unspecified cerebrovascular disease affecting unspecified side: Secondary | ICD-10-CM | POA: Diagnosis not present

## 2012-09-03 DIAGNOSIS — G8190 Hemiplegia, unspecified affecting unspecified side: Secondary | ICD-10-CM | POA: Diagnosis not present

## 2012-09-03 DIAGNOSIS — I69991 Dysphagia following unspecified cerebrovascular disease: Secondary | ICD-10-CM | POA: Diagnosis not present

## 2012-09-03 DIAGNOSIS — M6281 Muscle weakness (generalized): Secondary | ICD-10-CM | POA: Diagnosis not present

## 2012-09-03 DIAGNOSIS — I69919 Unspecified symptoms and signs involving cognitive functions following unspecified cerebrovascular disease: Secondary | ICD-10-CM | POA: Diagnosis not present

## 2012-09-04 DIAGNOSIS — R1310 Dysphagia, unspecified: Secondary | ICD-10-CM | POA: Diagnosis not present

## 2012-09-04 DIAGNOSIS — G8190 Hemiplegia, unspecified affecting unspecified side: Secondary | ICD-10-CM | POA: Diagnosis not present

## 2012-09-04 DIAGNOSIS — I69919 Unspecified symptoms and signs involving cognitive functions following unspecified cerebrovascular disease: Secondary | ICD-10-CM | POA: Diagnosis not present

## 2012-09-04 DIAGNOSIS — R633 Feeding difficulties: Secondary | ICD-10-CM | POA: Diagnosis not present

## 2012-09-04 DIAGNOSIS — I69959 Hemiplegia and hemiparesis following unspecified cerebrovascular disease affecting unspecified side: Secondary | ICD-10-CM | POA: Diagnosis not present

## 2012-09-04 DIAGNOSIS — M6281 Muscle weakness (generalized): Secondary | ICD-10-CM | POA: Diagnosis not present

## 2012-09-04 DIAGNOSIS — I69991 Dysphagia following unspecified cerebrovascular disease: Secondary | ICD-10-CM | POA: Diagnosis not present

## 2012-09-04 DIAGNOSIS — R634 Abnormal weight loss: Secondary | ICD-10-CM | POA: Diagnosis not present

## 2012-09-04 DIAGNOSIS — I69993 Ataxia following unspecified cerebrovascular disease: Secondary | ICD-10-CM | POA: Diagnosis not present

## 2012-09-05 DIAGNOSIS — I69919 Unspecified symptoms and signs involving cognitive functions following unspecified cerebrovascular disease: Secondary | ICD-10-CM | POA: Diagnosis not present

## 2012-09-05 DIAGNOSIS — I69959 Hemiplegia and hemiparesis following unspecified cerebrovascular disease affecting unspecified side: Secondary | ICD-10-CM | POA: Diagnosis not present

## 2012-09-05 DIAGNOSIS — G8190 Hemiplegia, unspecified affecting unspecified side: Secondary | ICD-10-CM | POA: Diagnosis not present

## 2012-09-05 DIAGNOSIS — N390 Urinary tract infection, site not specified: Secondary | ICD-10-CM | POA: Diagnosis not present

## 2012-09-05 DIAGNOSIS — I69993 Ataxia following unspecified cerebrovascular disease: Secondary | ICD-10-CM | POA: Diagnosis not present

## 2012-09-05 DIAGNOSIS — R1310 Dysphagia, unspecified: Secondary | ICD-10-CM | POA: Diagnosis not present

## 2012-09-05 DIAGNOSIS — M6281 Muscle weakness (generalized): Secondary | ICD-10-CM | POA: Diagnosis not present

## 2012-09-05 DIAGNOSIS — I69991 Dysphagia following unspecified cerebrovascular disease: Secondary | ICD-10-CM | POA: Diagnosis not present

## 2012-09-05 DIAGNOSIS — R339 Retention of urine, unspecified: Secondary | ICD-10-CM | POA: Diagnosis not present

## 2012-09-08 ENCOUNTER — Non-Acute Institutional Stay (SKILLED_NURSING_FACILITY): Payer: Medicare Other | Admitting: Geriatric Medicine

## 2012-09-08 ENCOUNTER — Encounter: Payer: Self-pay | Admitting: Geriatric Medicine

## 2012-09-08 DIAGNOSIS — M6281 Muscle weakness (generalized): Secondary | ICD-10-CM | POA: Diagnosis not present

## 2012-09-08 DIAGNOSIS — Z5189 Encounter for other specified aftercare: Secondary | ICD-10-CM

## 2012-09-08 DIAGNOSIS — I69919 Unspecified symptoms and signs involving cognitive functions following unspecified cerebrovascular disease: Secondary | ICD-10-CM | POA: Diagnosis not present

## 2012-09-08 DIAGNOSIS — G8190 Hemiplegia, unspecified affecting unspecified side: Secondary | ICD-10-CM | POA: Diagnosis not present

## 2012-09-08 DIAGNOSIS — E119 Type 2 diabetes mellitus without complications: Secondary | ICD-10-CM

## 2012-09-08 DIAGNOSIS — N390 Urinary tract infection, site not specified: Secondary | ICD-10-CM | POA: Insufficient documentation

## 2012-09-08 DIAGNOSIS — I10 Essential (primary) hypertension: Secondary | ICD-10-CM | POA: Diagnosis not present

## 2012-09-08 DIAGNOSIS — T18128D Food in esophagus causing other injury, subsequent encounter: Secondary | ICD-10-CM

## 2012-09-08 DIAGNOSIS — I69959 Hemiplegia and hemiparesis following unspecified cerebrovascular disease affecting unspecified side: Secondary | ICD-10-CM | POA: Diagnosis not present

## 2012-09-08 DIAGNOSIS — I69991 Dysphagia following unspecified cerebrovascular disease: Secondary | ICD-10-CM | POA: Diagnosis not present

## 2012-09-08 DIAGNOSIS — I69993 Ataxia following unspecified cerebrovascular disease: Secondary | ICD-10-CM | POA: Diagnosis not present

## 2012-09-08 NOTE — Assessment & Plan Note (Signed)
Treatment started with p.o. Cipro on 09/06/2012 for urinary tract infection. Cultures pending. No adverse effects from antibiotic.

## 2012-09-08 NOTE — Assessment & Plan Note (Signed)
CBGs continue to be well-controlled off medication. Continue to monitor daily.

## 2012-09-08 NOTE — Assessment & Plan Note (Signed)
Tolerating liquid and pured diet without signs of esophageal obstruction. Patient understands importance of drinking plenty of liquids with in between meals. Scheduled for EGD and Botox treatment to esophagus 10/03/2012 w/ Dr.Magod

## 2012-09-08 NOTE — Progress Notes (Signed)
Patient ID: Rachel Cooke, female   DOB: 1919-04-19, 79 y.o.   MRN: [id number] [facility] [phone])  Chief Complaint  Patient presents with  . Urinary Tract Infection  . Esophageal dysmotility    HPI: This  79 y.o. female resident of [facility],Skilled Nursing section is evaluated today due to new diagnosis UTI 09/06/2012.  On 09/05/12 Nursing assessment revealed new mental confusion (emphatic that she was never put to bed last night and "I have the same clothes on from yesterday"... She also stated it was "1945" however, is aware of her location and the current time of day). Family member was concerned for development of UTI, urine was sent for analysis and culture. UA results were consistent with UTI, PO Cipro was started 09/06/2012. Culture results pending. No further confusion exhibited. Patient is tolerating antibiotics without adverse effects, p.o. intake is adequate. Review of record notes that she's not had a BM since 09/06/12.   Patient returned to Dr. Ewing Schlein on July 4 July 3 in followup of her esophageal dysmotility and impaction. He made it clear to the patient and her family this impaction was very serious, and possibly life threatening. She is very fortunate she did not aspirate food or barium. He made it clear that [facility] is to take appropriate steps to ensure impaction does not occur again. Diet consistency will be monitored closely by speech therapy, she must be drinking plenty of liquids with each meal. Also must remain sitting up for one hour after eating.   diet has been advanced from liquids to periods. P.o. medications have been restarted, they're crushed. Patient scheduled for repeat EGD with consideration of Botox injections 10/03/2012.  Diabetes has remained well-controlled off medication. Blood pressure noted with some mild elevation last week better last few days.   Patient continues enthusiastic participation with both PT and OT  following her CVA. She is making some progress with strength and coordination of her right arm though it does remain with significant edema. Right leg continues to be very weak, PT reports she is able to stand with Max. Assistance.    Allergies  Allergen Reactions  . Lactose Intolerance (Gi)    Medications Reviewed  DATA REVIEWED:  Laboratory Studies: [facility], external  08/21/2012: Glucose 101, BUN 5, creatinine 0.46, sodium 139, potassium 3.6. LFTs WNL. Albumin 3.0 GFR 88  TSH 2.48  09/06/2012 urinalysis. Large blood, 30 protein, positive nitrate, large leukocyte esterase, many bacteria      Review of Systems  DATA OBTAINED: from patient, nurse, medical record,  GENERAL: Feels well   No fevers, fatigue, change in appetite or weight SKIN: No itch, rash or open wounds EYES: No eye pain, dryness or itching  No change in vision EARS: No earache, change in hearing NOSE: No congestion, drainage or bleeding MOUTH/THROAT: No mouth or tooth pain  No sore throat No difficulty chewing or swallowing RESPIRATORY: No cough, wheezing, SOB CARDIAC: No chest pain, palpitations  No edema. GI: No abdominal pain  No N/V/D or constipation  No heartburn or reflux  GU: No dysuria, frequency or urgency  No change in urine volume or character  MUSCULOSKELETAL: No joint pain, swelling or stiffness  No back pain  No muscle ache, pain, weakness  Weakness rt arm/ leg NEUROLOGIC: No dizziness, fainting, headache,  No change in mental status.  Rt. hemiparesis PSYCHIATRIC: No feelings of anxiety, depression Sleeps well.  No behavior issue.   Physical Exam Filed Vitals:   09/08/12 1054  BP:  126/69  Pulse: 88  Temp: 97.3 F (36.3 C)  Resp: 16  SpO2: 96%   Body mass index is 27.45 kg/(m^2).  GENERAL APPEARANCE: No acute distress, appropriately groomed, Overweight body habitus. Alert, pleasant, conversant. HEAD: Normocephalic, atraumatic EYES: Conjunctiva/lids clear. Pupils round, reactive. EARS:  Decreased Hearing   NOSE: No deformity or discharge. MOUTH/THROAT: Lips w/o lesions. Oral mucosa, tongue moist, w/o lesion. Oropharynx w/o redness or lesions.  NECK: Supple, full ROM. No thyroid tenderness, enlargement or nodule LYMPHATICS: No head, neck or supraclavicular adenopathy RESPIRATORY: Breathing is even, unlabored. Lung sounds are clear and full.  CARDIOVASCULAR: Heart RRR. No murmur or extra heart sounds GASTROINTESTINAL: Abdomen is soft, non-tender, not distended w/ normal bowel sounds.  GENITOURINARY: Bladder non tender, not distended. MUSCULOSKELETAL:  Back with moderate kyphosis, No scoliosis or spinal process tenderness.   NEUROLOGIC: Oriented to time, place, person. Cranial nerves 2-12 grossly intact, speech clear, no tremor.  Rt. Arm with edema, weak hand grip, not flaccid. Rt. Leg weakness evident PSYCHIATRIC: Mood and affect appropriate to situation  ASSESSMENT/PLAN  UTI (urinary tract infection) Treatment started with p.o. Cipro on 09/06/2012 for urinary tract infection. Cultures pending. No adverse effects from antibiotic.  Food impaction of esophagus Tolerating liquid and pured diet without signs of esophageal obstruction. Patient understands importance of drinking plenty of liquids with in between meals. Scheduled for EGD and Botox treatment to esophagus 10/03/2012 w/ Dr.Magod  HTN (hypertension) Mild hypertension noted at times last week, better readings last few days. May be related to UTI. Continue to monitor closely may need to adjust medications further. Want to avoid hypotension in light of recent CVA  Type II or unspecified type diabetes mellitus without mention of complication, not stated as uncontrolled CBGs continue to be well-controlled off medication. Continue to monitor daily.    Follow up: Routine or as needed  Draya Felker T.Fernado Brigante, NP-C 09/08/2012

## 2012-09-08 NOTE — Assessment & Plan Note (Signed)
Mild hypertension noted at times last week, better readings last few days. May be related to UTI. Continue to monitor closely may need to adjust medications further. Want to avoid hypotension in light of recent CVA

## 2012-09-09 DIAGNOSIS — G8190 Hemiplegia, unspecified affecting unspecified side: Secondary | ICD-10-CM | POA: Diagnosis not present

## 2012-09-09 DIAGNOSIS — I6350 Cerebral infarction due to unspecified occlusion or stenosis of unspecified cerebral artery: Secondary | ICD-10-CM | POA: Diagnosis not present

## 2012-09-09 DIAGNOSIS — I69993 Ataxia following unspecified cerebrovascular disease: Secondary | ICD-10-CM | POA: Diagnosis not present

## 2012-09-09 DIAGNOSIS — I69919 Unspecified symptoms and signs involving cognitive functions following unspecified cerebrovascular disease: Secondary | ICD-10-CM | POA: Diagnosis not present

## 2012-09-09 DIAGNOSIS — E785 Hyperlipidemia, unspecified: Secondary | ICD-10-CM | POA: Diagnosis not present

## 2012-09-09 DIAGNOSIS — M6281 Muscle weakness (generalized): Secondary | ICD-10-CM | POA: Diagnosis not present

## 2012-09-09 DIAGNOSIS — I69991 Dysphagia following unspecified cerebrovascular disease: Secondary | ICD-10-CM | POA: Diagnosis not present

## 2012-09-09 DIAGNOSIS — I10 Essential (primary) hypertension: Secondary | ICD-10-CM | POA: Diagnosis not present

## 2012-09-09 DIAGNOSIS — I69959 Hemiplegia and hemiparesis following unspecified cerebrovascular disease affecting unspecified side: Secondary | ICD-10-CM | POA: Diagnosis not present

## 2012-09-09 DIAGNOSIS — E039 Hypothyroidism, unspecified: Secondary | ICD-10-CM | POA: Diagnosis not present

## 2012-09-09 DIAGNOSIS — Z79899 Other long term (current) drug therapy: Secondary | ICD-10-CM | POA: Diagnosis not present

## 2012-09-10 DIAGNOSIS — I69919 Unspecified symptoms and signs involving cognitive functions following unspecified cerebrovascular disease: Secondary | ICD-10-CM | POA: Diagnosis not present

## 2012-09-10 DIAGNOSIS — I69959 Hemiplegia and hemiparesis following unspecified cerebrovascular disease affecting unspecified side: Secondary | ICD-10-CM | POA: Diagnosis not present

## 2012-09-10 DIAGNOSIS — M6281 Muscle weakness (generalized): Secondary | ICD-10-CM | POA: Diagnosis not present

## 2012-09-10 DIAGNOSIS — G8190 Hemiplegia, unspecified affecting unspecified side: Secondary | ICD-10-CM | POA: Diagnosis not present

## 2012-09-10 DIAGNOSIS — I69993 Ataxia following unspecified cerebrovascular disease: Secondary | ICD-10-CM | POA: Diagnosis not present

## 2012-09-10 DIAGNOSIS — I69991 Dysphagia following unspecified cerebrovascular disease: Secondary | ICD-10-CM | POA: Diagnosis not present

## 2012-09-11 DIAGNOSIS — G8190 Hemiplegia, unspecified affecting unspecified side: Secondary | ICD-10-CM | POA: Diagnosis not present

## 2012-09-11 DIAGNOSIS — I69993 Ataxia following unspecified cerebrovascular disease: Secondary | ICD-10-CM | POA: Diagnosis not present

## 2012-09-11 DIAGNOSIS — M6281 Muscle weakness (generalized): Secondary | ICD-10-CM | POA: Diagnosis not present

## 2012-09-11 DIAGNOSIS — I69959 Hemiplegia and hemiparesis following unspecified cerebrovascular disease affecting unspecified side: Secondary | ICD-10-CM | POA: Diagnosis not present

## 2012-09-11 DIAGNOSIS — I69991 Dysphagia following unspecified cerebrovascular disease: Secondary | ICD-10-CM | POA: Diagnosis not present

## 2012-09-11 DIAGNOSIS — I69919 Unspecified symptoms and signs involving cognitive functions following unspecified cerebrovascular disease: Secondary | ICD-10-CM | POA: Diagnosis not present

## 2012-09-12 DIAGNOSIS — I69991 Dysphagia following unspecified cerebrovascular disease: Secondary | ICD-10-CM | POA: Diagnosis not present

## 2012-09-12 DIAGNOSIS — I69919 Unspecified symptoms and signs involving cognitive functions following unspecified cerebrovascular disease: Secondary | ICD-10-CM | POA: Diagnosis not present

## 2012-09-12 DIAGNOSIS — G8190 Hemiplegia, unspecified affecting unspecified side: Secondary | ICD-10-CM | POA: Diagnosis not present

## 2012-09-12 DIAGNOSIS — M6281 Muscle weakness (generalized): Secondary | ICD-10-CM | POA: Diagnosis not present

## 2012-09-12 DIAGNOSIS — I69959 Hemiplegia and hemiparesis following unspecified cerebrovascular disease affecting unspecified side: Secondary | ICD-10-CM | POA: Diagnosis not present

## 2012-09-12 DIAGNOSIS — I69993 Ataxia following unspecified cerebrovascular disease: Secondary | ICD-10-CM | POA: Diagnosis not present

## 2012-09-13 DIAGNOSIS — I69993 Ataxia following unspecified cerebrovascular disease: Secondary | ICD-10-CM | POA: Diagnosis not present

## 2012-09-13 DIAGNOSIS — G8190 Hemiplegia, unspecified affecting unspecified side: Secondary | ICD-10-CM | POA: Diagnosis not present

## 2012-09-13 DIAGNOSIS — I69959 Hemiplegia and hemiparesis following unspecified cerebrovascular disease affecting unspecified side: Secondary | ICD-10-CM | POA: Diagnosis not present

## 2012-09-13 DIAGNOSIS — I69919 Unspecified symptoms and signs involving cognitive functions following unspecified cerebrovascular disease: Secondary | ICD-10-CM | POA: Diagnosis not present

## 2012-09-13 DIAGNOSIS — I69991 Dysphagia following unspecified cerebrovascular disease: Secondary | ICD-10-CM | POA: Diagnosis not present

## 2012-09-13 DIAGNOSIS — M6281 Muscle weakness (generalized): Secondary | ICD-10-CM | POA: Diagnosis not present

## 2012-09-15 DIAGNOSIS — G8190 Hemiplegia, unspecified affecting unspecified side: Secondary | ICD-10-CM | POA: Diagnosis not present

## 2012-09-15 DIAGNOSIS — I69959 Hemiplegia and hemiparesis following unspecified cerebrovascular disease affecting unspecified side: Secondary | ICD-10-CM | POA: Diagnosis not present

## 2012-09-15 DIAGNOSIS — I69991 Dysphagia following unspecified cerebrovascular disease: Secondary | ICD-10-CM | POA: Diagnosis not present

## 2012-09-15 DIAGNOSIS — M6281 Muscle weakness (generalized): Secondary | ICD-10-CM | POA: Diagnosis not present

## 2012-09-15 DIAGNOSIS — I69993 Ataxia following unspecified cerebrovascular disease: Secondary | ICD-10-CM | POA: Diagnosis not present

## 2012-09-15 DIAGNOSIS — I69919 Unspecified symptoms and signs involving cognitive functions following unspecified cerebrovascular disease: Secondary | ICD-10-CM | POA: Diagnosis not present

## 2012-09-16 ENCOUNTER — Ambulatory Visit: Payer: Medicare Other | Admitting: Nurse Practitioner

## 2012-09-16 DIAGNOSIS — M6281 Muscle weakness (generalized): Secondary | ICD-10-CM | POA: Diagnosis not present

## 2012-09-16 DIAGNOSIS — I69959 Hemiplegia and hemiparesis following unspecified cerebrovascular disease affecting unspecified side: Secondary | ICD-10-CM | POA: Diagnosis not present

## 2012-09-16 DIAGNOSIS — I69919 Unspecified symptoms and signs involving cognitive functions following unspecified cerebrovascular disease: Secondary | ICD-10-CM | POA: Diagnosis not present

## 2012-09-16 DIAGNOSIS — G8190 Hemiplegia, unspecified affecting unspecified side: Secondary | ICD-10-CM | POA: Diagnosis not present

## 2012-09-16 DIAGNOSIS — I69993 Ataxia following unspecified cerebrovascular disease: Secondary | ICD-10-CM | POA: Diagnosis not present

## 2012-09-16 DIAGNOSIS — I69991 Dysphagia following unspecified cerebrovascular disease: Secondary | ICD-10-CM | POA: Diagnosis not present

## 2012-09-17 DIAGNOSIS — M6281 Muscle weakness (generalized): Secondary | ICD-10-CM | POA: Diagnosis not present

## 2012-09-17 DIAGNOSIS — I69919 Unspecified symptoms and signs involving cognitive functions following unspecified cerebrovascular disease: Secondary | ICD-10-CM | POA: Diagnosis not present

## 2012-09-17 DIAGNOSIS — G8190 Hemiplegia, unspecified affecting unspecified side: Secondary | ICD-10-CM | POA: Diagnosis not present

## 2012-09-17 DIAGNOSIS — I69991 Dysphagia following unspecified cerebrovascular disease: Secondary | ICD-10-CM | POA: Diagnosis not present

## 2012-09-17 DIAGNOSIS — I69993 Ataxia following unspecified cerebrovascular disease: Secondary | ICD-10-CM | POA: Diagnosis not present

## 2012-09-17 DIAGNOSIS — I69959 Hemiplegia and hemiparesis following unspecified cerebrovascular disease affecting unspecified side: Secondary | ICD-10-CM | POA: Diagnosis not present

## 2012-09-18 ENCOUNTER — Encounter: Payer: Self-pay | Admitting: Geriatric Medicine

## 2012-09-18 ENCOUNTER — Non-Acute Institutional Stay (SKILLED_NURSING_FACILITY): Payer: Medicare Other | Admitting: Geriatric Medicine

## 2012-09-18 DIAGNOSIS — G8190 Hemiplegia, unspecified affecting unspecified side: Secondary | ICD-10-CM | POA: Diagnosis not present

## 2012-09-18 DIAGNOSIS — I69919 Unspecified symptoms and signs involving cognitive functions following unspecified cerebrovascular disease: Secondary | ICD-10-CM | POA: Diagnosis not present

## 2012-09-18 DIAGNOSIS — I69959 Hemiplegia and hemiparesis following unspecified cerebrovascular disease affecting unspecified side: Secondary | ICD-10-CM | POA: Diagnosis not present

## 2012-09-18 DIAGNOSIS — E119 Type 2 diabetes mellitus without complications: Secondary | ICD-10-CM | POA: Diagnosis not present

## 2012-09-18 DIAGNOSIS — T18128D Food in esophagus causing other injury, subsequent encounter: Secondary | ICD-10-CM

## 2012-09-18 DIAGNOSIS — N390 Urinary tract infection, site not specified: Secondary | ICD-10-CM | POA: Diagnosis not present

## 2012-09-18 DIAGNOSIS — Z5189 Encounter for other specified aftercare: Secondary | ICD-10-CM | POA: Diagnosis not present

## 2012-09-18 DIAGNOSIS — I10 Essential (primary) hypertension: Secondary | ICD-10-CM

## 2012-09-18 DIAGNOSIS — I69991 Dysphagia following unspecified cerebrovascular disease: Secondary | ICD-10-CM | POA: Diagnosis not present

## 2012-09-18 DIAGNOSIS — M6281 Muscle weakness (generalized): Secondary | ICD-10-CM | POA: Diagnosis not present

## 2012-09-18 DIAGNOSIS — I69993 Ataxia following unspecified cerebrovascular disease: Secondary | ICD-10-CM | POA: Diagnosis not present

## 2012-09-19 DIAGNOSIS — G8190 Hemiplegia, unspecified affecting unspecified side: Secondary | ICD-10-CM | POA: Diagnosis not present

## 2012-09-19 DIAGNOSIS — I69991 Dysphagia following unspecified cerebrovascular disease: Secondary | ICD-10-CM | POA: Diagnosis not present

## 2012-09-19 DIAGNOSIS — I69919 Unspecified symptoms and signs involving cognitive functions following unspecified cerebrovascular disease: Secondary | ICD-10-CM | POA: Diagnosis not present

## 2012-09-19 DIAGNOSIS — I69959 Hemiplegia and hemiparesis following unspecified cerebrovascular disease affecting unspecified side: Secondary | ICD-10-CM | POA: Diagnosis not present

## 2012-09-19 DIAGNOSIS — I69993 Ataxia following unspecified cerebrovascular disease: Secondary | ICD-10-CM | POA: Diagnosis not present

## 2012-09-19 DIAGNOSIS — M6281 Muscle weakness (generalized): Secondary | ICD-10-CM | POA: Diagnosis not present

## 2012-09-20 DIAGNOSIS — I69919 Unspecified symptoms and signs involving cognitive functions following unspecified cerebrovascular disease: Secondary | ICD-10-CM | POA: Diagnosis not present

## 2012-09-20 DIAGNOSIS — I69959 Hemiplegia and hemiparesis following unspecified cerebrovascular disease affecting unspecified side: Secondary | ICD-10-CM | POA: Diagnosis not present

## 2012-09-20 DIAGNOSIS — I69993 Ataxia following unspecified cerebrovascular disease: Secondary | ICD-10-CM | POA: Diagnosis not present

## 2012-09-20 DIAGNOSIS — M6281 Muscle weakness (generalized): Secondary | ICD-10-CM | POA: Diagnosis not present

## 2012-09-20 DIAGNOSIS — G8190 Hemiplegia, unspecified affecting unspecified side: Secondary | ICD-10-CM | POA: Diagnosis not present

## 2012-09-20 DIAGNOSIS — I69991 Dysphagia following unspecified cerebrovascular disease: Secondary | ICD-10-CM | POA: Diagnosis not present

## 2012-09-22 DIAGNOSIS — M6281 Muscle weakness (generalized): Secondary | ICD-10-CM | POA: Diagnosis not present

## 2012-09-22 DIAGNOSIS — G8190 Hemiplegia, unspecified affecting unspecified side: Secondary | ICD-10-CM | POA: Diagnosis not present

## 2012-09-22 DIAGNOSIS — I69991 Dysphagia following unspecified cerebrovascular disease: Secondary | ICD-10-CM | POA: Diagnosis not present

## 2012-09-22 DIAGNOSIS — I69993 Ataxia following unspecified cerebrovascular disease: Secondary | ICD-10-CM | POA: Diagnosis not present

## 2012-09-22 DIAGNOSIS — I69919 Unspecified symptoms and signs involving cognitive functions following unspecified cerebrovascular disease: Secondary | ICD-10-CM | POA: Diagnosis not present

## 2012-09-22 DIAGNOSIS — I69959 Hemiplegia and hemiparesis following unspecified cerebrovascular disease affecting unspecified side: Secondary | ICD-10-CM | POA: Diagnosis not present

## 2012-09-23 DIAGNOSIS — I69993 Ataxia following unspecified cerebrovascular disease: Secondary | ICD-10-CM | POA: Diagnosis not present

## 2012-09-23 DIAGNOSIS — I69919 Unspecified symptoms and signs involving cognitive functions following unspecified cerebrovascular disease: Secondary | ICD-10-CM | POA: Diagnosis not present

## 2012-09-23 DIAGNOSIS — M6281 Muscle weakness (generalized): Secondary | ICD-10-CM | POA: Diagnosis not present

## 2012-09-23 DIAGNOSIS — E1159 Type 2 diabetes mellitus with other circulatory complications: Secondary | ICD-10-CM | POA: Diagnosis not present

## 2012-09-23 DIAGNOSIS — L851 Acquired keratosis [keratoderma] palmaris et plantaris: Secondary | ICD-10-CM | POA: Diagnosis not present

## 2012-09-23 DIAGNOSIS — I69959 Hemiplegia and hemiparesis following unspecified cerebrovascular disease affecting unspecified side: Secondary | ICD-10-CM | POA: Diagnosis not present

## 2012-09-23 DIAGNOSIS — G8190 Hemiplegia, unspecified affecting unspecified side: Secondary | ICD-10-CM | POA: Diagnosis not present

## 2012-09-23 DIAGNOSIS — I69991 Dysphagia following unspecified cerebrovascular disease: Secondary | ICD-10-CM | POA: Diagnosis not present

## 2012-09-23 DIAGNOSIS — L608 Other nail disorders: Secondary | ICD-10-CM | POA: Diagnosis not present

## 2012-09-24 DIAGNOSIS — I69919 Unspecified symptoms and signs involving cognitive functions following unspecified cerebrovascular disease: Secondary | ICD-10-CM | POA: Diagnosis not present

## 2012-09-24 DIAGNOSIS — I69991 Dysphagia following unspecified cerebrovascular disease: Secondary | ICD-10-CM | POA: Diagnosis not present

## 2012-09-24 DIAGNOSIS — I69993 Ataxia following unspecified cerebrovascular disease: Secondary | ICD-10-CM | POA: Diagnosis not present

## 2012-09-24 DIAGNOSIS — G8190 Hemiplegia, unspecified affecting unspecified side: Secondary | ICD-10-CM | POA: Diagnosis not present

## 2012-09-24 DIAGNOSIS — M6281 Muscle weakness (generalized): Secondary | ICD-10-CM | POA: Diagnosis not present

## 2012-09-24 DIAGNOSIS — I69959 Hemiplegia and hemiparesis following unspecified cerebrovascular disease affecting unspecified side: Secondary | ICD-10-CM | POA: Diagnosis not present

## 2012-09-25 DIAGNOSIS — I69993 Ataxia following unspecified cerebrovascular disease: Secondary | ICD-10-CM | POA: Diagnosis not present

## 2012-09-25 DIAGNOSIS — M6281 Muscle weakness (generalized): Secondary | ICD-10-CM | POA: Diagnosis not present

## 2012-09-25 DIAGNOSIS — G8190 Hemiplegia, unspecified affecting unspecified side: Secondary | ICD-10-CM | POA: Diagnosis not present

## 2012-09-25 DIAGNOSIS — I69919 Unspecified symptoms and signs involving cognitive functions following unspecified cerebrovascular disease: Secondary | ICD-10-CM | POA: Diagnosis not present

## 2012-09-25 DIAGNOSIS — I69991 Dysphagia following unspecified cerebrovascular disease: Secondary | ICD-10-CM | POA: Diagnosis not present

## 2012-09-25 DIAGNOSIS — I69959 Hemiplegia and hemiparesis following unspecified cerebrovascular disease affecting unspecified side: Secondary | ICD-10-CM | POA: Diagnosis not present

## 2012-09-26 DIAGNOSIS — I69959 Hemiplegia and hemiparesis following unspecified cerebrovascular disease affecting unspecified side: Secondary | ICD-10-CM | POA: Diagnosis not present

## 2012-09-26 DIAGNOSIS — G8190 Hemiplegia, unspecified affecting unspecified side: Secondary | ICD-10-CM | POA: Diagnosis not present

## 2012-09-26 DIAGNOSIS — I69991 Dysphagia following unspecified cerebrovascular disease: Secondary | ICD-10-CM | POA: Diagnosis not present

## 2012-09-26 DIAGNOSIS — I69919 Unspecified symptoms and signs involving cognitive functions following unspecified cerebrovascular disease: Secondary | ICD-10-CM | POA: Diagnosis not present

## 2012-09-26 DIAGNOSIS — I69993 Ataxia following unspecified cerebrovascular disease: Secondary | ICD-10-CM | POA: Diagnosis not present

## 2012-09-26 DIAGNOSIS — M6281 Muscle weakness (generalized): Secondary | ICD-10-CM | POA: Diagnosis not present

## 2012-09-27 ENCOUNTER — Encounter: Payer: Self-pay | Admitting: Geriatric Medicine

## 2012-09-27 DIAGNOSIS — I69991 Dysphagia following unspecified cerebrovascular disease: Secondary | ICD-10-CM | POA: Diagnosis not present

## 2012-09-27 DIAGNOSIS — M6281 Muscle weakness (generalized): Secondary | ICD-10-CM | POA: Diagnosis not present

## 2012-09-27 DIAGNOSIS — I69993 Ataxia following unspecified cerebrovascular disease: Secondary | ICD-10-CM | POA: Diagnosis not present

## 2012-09-27 DIAGNOSIS — I69919 Unspecified symptoms and signs involving cognitive functions following unspecified cerebrovascular disease: Secondary | ICD-10-CM | POA: Diagnosis not present

## 2012-09-27 DIAGNOSIS — I69959 Hemiplegia and hemiparesis following unspecified cerebrovascular disease affecting unspecified side: Secondary | ICD-10-CM | POA: Diagnosis not present

## 2012-09-27 DIAGNOSIS — G8190 Hemiplegia, unspecified affecting unspecified side: Secondary | ICD-10-CM | POA: Diagnosis not present

## 2012-09-27 NOTE — Assessment & Plan Note (Signed)
CBGs well controlled off medication, fasting 120-130. A1C satisfactory (6.0). Diet is limited, continue to monitor.

## 2012-09-27 NOTE — Assessment & Plan Note (Signed)
Resolved

## 2012-09-27 NOTE — Progress Notes (Signed)
Patient ID: Rachel Cooke, female   DOB: 02-02-1920, 79 y.o.   MRN: [id number] [facility] [phone])  Code Status: Living Will, DNR  Contact Information   Name Relation Home Work [place] Daughter [phone]  [phone]      Chief Complaint  Patient presents with  . Medical Managment of Chronic Issues    DM, HTN, Weight,     HPI: This is a 79 y.o. female resident of [facility], Skilled Nursing section evaluated today for management of ongoing medical issues.  Review of record shows pt. Has completed antibiotic course for UTI; no recurrent urinary tract symptoms. Patient's BP/P measures remain stable, CBGs satisfactory. Patient is tolerating restricted diet, meal intake fluctuates, though mostly >75%. Weight is stable last 2 weeks. No sign of esophageal impaction. Patient continues to enthusiastically participate with post CVA  PT/OT/ST and is making progress in all areas.    Allergies  Allergen Reactions  . Lactose Intolerance (Gi)    Medications Reviewed  DATA REVIEWED:  Laboratory Studies: [facility], external  08/21/2012: Glucose 101, BUN 5, creatinine 0.46, sodium 139, potassium 3.6. LFTs WNL. Albumin 3.0 GFR 88  TSH 2.48  09/06/2012 urinalysis. Large blood, 30 protein, positive nitrate, large leukocyte esterase, many bacteria  09/10/2102: WBC 5.2, Hgb 11.9, Hct 36.3, plt 199   Glu 121, BUN 9, Cr. .46, Na 141, K+ 3.7 Albumin 3.4  TC 162, TG 242, HDL 31, LDL 94  A1C 6.0  Review of Systems  DATA OBTAINED: from patient, nurse, medical record,  GENERAL: Feels well   No fevers, fatigue, change in appetite or weight SKIN: No itch, rash or open wounds EYES: No eye pain, dryness or itching  No change in vision EARS: No earache, change in hearing NOSE: No congestion, drainage or bleeding MOUTH/THROAT: No mouth or tooth pain  No sore throat No difficulty chewing or swallowing RESPIRATORY: No cough, wheezing,  SOB CARDIAC: No chest pain, palpitations  No edema. GI: No abdominal pain  No N/V/D or constipation  No heartburn or reflux  GU: No dysuria, frequency or urgency  No change in urine volume or character  MUSCULOSKELETAL: No joint pain, swelling or stiffness  No back pain  No muscle ache, pain, weakness  Weakness rt arm/ leg NEUROLOGIC: No dizziness, fainting, headache,  No change in mental status.  Rt. hemiparesis PSYCHIATRIC: No feelings of anxiety, depression Sleeps well.  No behavior issue.   Physical Exam Filed Vitals:   09/18/12 1346  BP: 135/74  Pulse: 95  Temp: 97.6 F (36.4 C)  Resp: 18  Weight: 171 lb 9.6 oz (77.837 kg)  SpO2: 95%   Body mass index is 27.71 kg/(m^2).  GENERAL APPEARANCE: No acute distress, appropriately groomed, Overweight body habitus. Alert, pleasant, conversant. HEAD: Normocephalic, atraumatic EYES: Conjunctiva/lids clear. Pupils round, reactive. EARS: Decreased Hearing   NOSE: No deformity or discharge. MOUTH/THROAT: Lips w/o lesions. Oral mucosa, tongue moist, w/o lesion. Oropharynx w/o redness or lesions.  NECK: Supple, full ROM. No thyroid tenderness, enlargement or nodule LYMPHATICS: No head, neck or supraclavicular adenopathy RESPIRATORY: Breathing is even, unlabored. Lung sounds are clear and full.  CARDIOVASCULAR: Heart RRR. No murmur or extra heart sounds GASTROINTESTINAL: Abdomen is soft, non-tender, not distended w/ normal bowel sounds.  GENITOURINARY: Bladder non tender, not distended. MUSCULOSKELETAL:  Back with moderate kyphosis, No scoliosis or spinal process tenderness.   NEUROLOGIC: Oriented to time, place, person. Cranial nerves 2-12 grossly intact, speech clear, no tremor.  Rt. Arm with  mild edema, stronger (though not baseline) hand grip, more coordinarted movement of hand/ arm. Rt. Leg weakness evident, improved ROM PSYCHIATRIC: Mood and affect appropriate to situation  ASSESSMENT/PLAN  UTI (urinary tract  infection) Resolved  Type II or unspecified type diabetes mellitus without mention of complication, not stated as uncontrolled CBGs well controlled off medication, fasting 120-130. A1C satisfactory (6.0). Diet is limited, continue to monitor.  HTN (hypertension) BP readings improved since usual medication restarted, 123-143/58-84. Lab satisfactory, continue medication  Food impaction of esophagus Continues to tolerate restricted diet, though admits to some boredom with it. I/O is adequate, having regular BM/ urine output. Initial weight loss, now stabilizing. For EGD and Botox treatment to esophagus 10/03/2012 w/ Dr.Magod   Family communication: Daughter asks about result of Carotid doppler completed after CVA; results were not reported to her. She is looking for a cause of the CVA. Reviewed reports in [facility]: no significant carotid stenosis, no significant findings on 2D echocardiogram eihter. Exact source of CVA remains unclear.   Follow up: Routine or as needed  Natasa Stigall T.Michaelene Dutan, NP-C 09/27/2012

## 2012-09-27 NOTE — Assessment & Plan Note (Addendum)
Continues to tolerate restricted diet, though admits to some boredom with it. I/O is adequate, having regular BM/ urine output. Initial weight loss, now stabilizing. For EGD and Botox treatment to esophagus 10/03/2012 w/ Dr.Magod

## 2012-09-27 NOTE — Assessment & Plan Note (Signed)
BP readings improved since usual medication restarted, 123-143/58-84. Lab satisfactory, continue medication

## 2012-09-29 ENCOUNTER — Other Ambulatory Visit: Payer: Self-pay | Admitting: Gastroenterology

## 2012-09-29 DIAGNOSIS — G8190 Hemiplegia, unspecified affecting unspecified side: Secondary | ICD-10-CM | POA: Diagnosis not present

## 2012-09-29 DIAGNOSIS — I69991 Dysphagia following unspecified cerebrovascular disease: Secondary | ICD-10-CM | POA: Diagnosis not present

## 2012-09-29 DIAGNOSIS — I69993 Ataxia following unspecified cerebrovascular disease: Secondary | ICD-10-CM | POA: Diagnosis not present

## 2012-09-29 DIAGNOSIS — M6281 Muscle weakness (generalized): Secondary | ICD-10-CM | POA: Diagnosis not present

## 2012-09-29 DIAGNOSIS — I69919 Unspecified symptoms and signs involving cognitive functions following unspecified cerebrovascular disease: Secondary | ICD-10-CM | POA: Diagnosis not present

## 2012-09-29 DIAGNOSIS — I69959 Hemiplegia and hemiparesis following unspecified cerebrovascular disease affecting unspecified side: Secondary | ICD-10-CM | POA: Diagnosis not present

## 2012-09-30 DIAGNOSIS — I69919 Unspecified symptoms and signs involving cognitive functions following unspecified cerebrovascular disease: Secondary | ICD-10-CM | POA: Diagnosis not present

## 2012-09-30 DIAGNOSIS — G8190 Hemiplegia, unspecified affecting unspecified side: Secondary | ICD-10-CM | POA: Diagnosis not present

## 2012-09-30 DIAGNOSIS — I69993 Ataxia following unspecified cerebrovascular disease: Secondary | ICD-10-CM | POA: Diagnosis not present

## 2012-09-30 DIAGNOSIS — M6281 Muscle weakness (generalized): Secondary | ICD-10-CM | POA: Diagnosis not present

## 2012-09-30 DIAGNOSIS — I69991 Dysphagia following unspecified cerebrovascular disease: Secondary | ICD-10-CM | POA: Diagnosis not present

## 2012-09-30 DIAGNOSIS — I69959 Hemiplegia and hemiparesis following unspecified cerebrovascular disease affecting unspecified side: Secondary | ICD-10-CM | POA: Diagnosis not present

## 2012-09-30 NOTE — Addendum Note (Signed)
Addended by: Tere Mcconaughey on: 09/30/2012 01:01 PM   Modules accepted: Orders  

## 2012-10-01 DIAGNOSIS — G8190 Hemiplegia, unspecified affecting unspecified side: Secondary | ICD-10-CM | POA: Diagnosis not present

## 2012-10-01 DIAGNOSIS — I69991 Dysphagia following unspecified cerebrovascular disease: Secondary | ICD-10-CM | POA: Diagnosis not present

## 2012-10-01 DIAGNOSIS — I69959 Hemiplegia and hemiparesis following unspecified cerebrovascular disease affecting unspecified side: Secondary | ICD-10-CM | POA: Diagnosis not present

## 2012-10-01 DIAGNOSIS — M6281 Muscle weakness (generalized): Secondary | ICD-10-CM | POA: Diagnosis not present

## 2012-10-01 DIAGNOSIS — I69919 Unspecified symptoms and signs involving cognitive functions following unspecified cerebrovascular disease: Secondary | ICD-10-CM | POA: Diagnosis not present

## 2012-10-01 DIAGNOSIS — I69993 Ataxia following unspecified cerebrovascular disease: Secondary | ICD-10-CM | POA: Diagnosis not present

## 2012-10-02 DIAGNOSIS — I69991 Dysphagia following unspecified cerebrovascular disease: Secondary | ICD-10-CM | POA: Diagnosis not present

## 2012-10-02 DIAGNOSIS — I69959 Hemiplegia and hemiparesis following unspecified cerebrovascular disease affecting unspecified side: Secondary | ICD-10-CM | POA: Diagnosis not present

## 2012-10-02 DIAGNOSIS — I69993 Ataxia following unspecified cerebrovascular disease: Secondary | ICD-10-CM | POA: Diagnosis not present

## 2012-10-02 DIAGNOSIS — G8190 Hemiplegia, unspecified affecting unspecified side: Secondary | ICD-10-CM | POA: Diagnosis not present

## 2012-10-02 DIAGNOSIS — I69919 Unspecified symptoms and signs involving cognitive functions following unspecified cerebrovascular disease: Secondary | ICD-10-CM | POA: Diagnosis not present

## 2012-10-02 DIAGNOSIS — M6281 Muscle weakness (generalized): Secondary | ICD-10-CM | POA: Diagnosis not present

## 2012-10-03 ENCOUNTER — Encounter (HOSPITAL_COMMUNITY): Payer: Self-pay | Admitting: Anesthesiology

## 2012-10-03 ENCOUNTER — Ambulatory Visit: Payer: Medicare Other | Admitting: Nurse Practitioner

## 2012-10-03 ENCOUNTER — Encounter (HOSPITAL_COMMUNITY)
Admission: RE | Disposition: A | Discharge status: Skilled Nursing Facility | Payer: Self-pay | Source: Ambulatory Visit | Attending: Gastroenterology

## 2012-10-03 ENCOUNTER — Ambulatory Visit (HOSPITAL_COMMUNITY)
Admission: RE | Admit: 2012-10-03 | Discharge: 2012-10-03 | Disposition: A | Discharge status: Skilled Nursing Facility | Payer: Medicare Other | Source: Ambulatory Visit | Attending: Gastroenterology | Admitting: Gastroenterology

## 2012-10-03 ENCOUNTER — Ambulatory Visit (HOSPITAL_COMMUNITY): Payer: Medicare Other | Admitting: Anesthesiology

## 2012-10-03 DIAGNOSIS — R269 Unspecified abnormalities of gait and mobility: Secondary | ICD-10-CM | POA: Diagnosis not present

## 2012-10-03 DIAGNOSIS — F411 Generalized anxiety disorder: Secondary | ICD-10-CM | POA: Diagnosis not present

## 2012-10-03 DIAGNOSIS — I69991 Dysphagia following unspecified cerebrovascular disease: Secondary | ICD-10-CM | POA: Insufficient documentation

## 2012-10-03 DIAGNOSIS — R1314 Dysphagia, pharyngoesophageal phase: Secondary | ICD-10-CM | POA: Diagnosis not present

## 2012-10-03 DIAGNOSIS — I69959 Hemiplegia and hemiparesis following unspecified cerebrovascular disease affecting unspecified side: Secondary | ICD-10-CM | POA: Diagnosis not present

## 2012-10-03 DIAGNOSIS — Z91040 Latex allergy status: Secondary | ICD-10-CM | POA: Insufficient documentation

## 2012-10-03 DIAGNOSIS — Z7902 Long term (current) use of antithrombotics/antiplatelets: Secondary | ICD-10-CM | POA: Insufficient documentation

## 2012-10-03 DIAGNOSIS — Z800 Family history of malignant neoplasm of digestive organs: Secondary | ICD-10-CM | POA: Insufficient documentation

## 2012-10-03 DIAGNOSIS — Z79899 Other long term (current) drug therapy: Secondary | ICD-10-CM | POA: Insufficient documentation

## 2012-10-03 DIAGNOSIS — R279 Unspecified lack of coordination: Secondary | ICD-10-CM | POA: Diagnosis not present

## 2012-10-03 DIAGNOSIS — K449 Diaphragmatic hernia without obstruction or gangrene: Secondary | ICD-10-CM | POA: Diagnosis not present

## 2012-10-03 DIAGNOSIS — K224 Dyskinesia of esophagus: Secondary | ICD-10-CM | POA: Insufficient documentation

## 2012-10-03 DIAGNOSIS — I442 Atrioventricular block, complete: Secondary | ICD-10-CM | POA: Diagnosis not present

## 2012-10-03 DIAGNOSIS — R1310 Dysphagia, unspecified: Secondary | ICD-10-CM | POA: Diagnosis not present

## 2012-10-03 DIAGNOSIS — E669 Obesity, unspecified: Secondary | ICD-10-CM | POA: Insufficient documentation

## 2012-10-03 DIAGNOSIS — M6281 Muscle weakness (generalized): Secondary | ICD-10-CM | POA: Diagnosis not present

## 2012-10-03 DIAGNOSIS — Z950 Presence of cardiac pacemaker: Secondary | ICD-10-CM | POA: Insufficient documentation

## 2012-10-03 DIAGNOSIS — I499 Cardiac arrhythmia, unspecified: Secondary | ICD-10-CM | POA: Diagnosis not present

## 2012-10-03 DIAGNOSIS — E7800 Pure hypercholesterolemia, unspecified: Secondary | ICD-10-CM | POA: Insufficient documentation

## 2012-10-03 DIAGNOSIS — R634 Abnormal weight loss: Secondary | ICD-10-CM | POA: Diagnosis not present

## 2012-10-03 DIAGNOSIS — R633 Feeding difficulties: Secondary | ICD-10-CM | POA: Diagnosis not present

## 2012-10-03 DIAGNOSIS — Z9109 Other allergy status, other than to drugs and biological substances: Secondary | ICD-10-CM | POA: Insufficient documentation

## 2012-10-03 DIAGNOSIS — I69993 Ataxia following unspecified cerebrovascular disease: Secondary | ICD-10-CM | POA: Diagnosis not present

## 2012-10-03 DIAGNOSIS — E739 Lactose intolerance, unspecified: Secondary | ICD-10-CM | POA: Insufficient documentation

## 2012-10-03 DIAGNOSIS — I252 Old myocardial infarction: Secondary | ICD-10-CM | POA: Diagnosis not present

## 2012-10-03 DIAGNOSIS — G8190 Hemiplegia, unspecified affecting unspecified side: Secondary | ICD-10-CM | POA: Diagnosis not present

## 2012-10-03 DIAGNOSIS — I2510 Atherosclerotic heart disease of native coronary artery without angina pectoris: Secondary | ICD-10-CM | POA: Diagnosis not present

## 2012-10-03 DIAGNOSIS — Z8249 Family history of ischemic heart disease and other diseases of the circulatory system: Secondary | ICD-10-CM | POA: Insufficient documentation

## 2012-10-03 DIAGNOSIS — E039 Hypothyroidism, unspecified: Secondary | ICD-10-CM | POA: Diagnosis not present

## 2012-10-03 DIAGNOSIS — R609 Edema, unspecified: Secondary | ICD-10-CM | POA: Diagnosis not present

## 2012-10-03 DIAGNOSIS — E119 Type 2 diabetes mellitus without complications: Secondary | ICD-10-CM | POA: Diagnosis not present

## 2012-10-03 DIAGNOSIS — K222 Esophageal obstruction: Secondary | ICD-10-CM | POA: Diagnosis not present

## 2012-10-03 DIAGNOSIS — I10 Essential (primary) hypertension: Secondary | ICD-10-CM | POA: Diagnosis not present

## 2012-10-03 DIAGNOSIS — I69919 Unspecified symptoms and signs involving cognitive functions following unspecified cerebrovascular disease: Secondary | ICD-10-CM | POA: Diagnosis not present

## 2012-10-03 HISTORY — PX: ESOPHAGOGASTRODUODENOSCOPY: SHX5428

## 2012-10-03 HISTORY — PX: BOTOX INJECTION: SHX5754

## 2012-10-03 HISTORY — PX: SAVORY DILATION: SHX5439

## 2012-10-03 LAB — GLUCOSE, CAPILLARY: Glucose-Capillary: 94 mg/dL (ref 70–99)

## 2012-10-03 SURGERY — EGD (ESOPHAGOGASTRODUODENOSCOPY)
Anesthesia: Monitor Anesthesia Care

## 2012-10-03 MED ORDER — SODIUM CHLORIDE 0.9 % IV SOLN: INTRAVENOUS | Status: DC

## 2012-10-03 MED ORDER — MIDAZOLAM HCL 10 MG/2ML IJ SOLN
INTRAMUSCULAR | Status: AC
  Filled 2012-10-03: qty 2

## 2012-10-03 MED ORDER — FENTANYL CITRATE 0.05 MG/ML IJ SOLN
INTRAMUSCULAR | Status: AC
  Filled 2012-10-03: qty 2

## 2012-10-03 MED ORDER — ONABOTULINUMTOXINA 100 UNITS IJ SOLR
100.0000 [IU] | Freq: Once | INTRAMUSCULAR | Status: DC
  Filled 2012-10-03: qty 100

## 2012-10-03 MED ORDER — PROPOFOL INFUSION 10 MG/ML OPTIME
INTRAVENOUS | Status: DC | PRN
  Administered 2012-10-03: 100 ug/kg/min via INTRAVENOUS

## 2012-10-03 MED ORDER — PROPOFOL 10 MG/ML IV EMUL
INTRAVENOUS | Status: DC | PRN
  Administered 2012-10-03: 20 mg via INTRAVENOUS

## 2012-10-03 MED ORDER — LACTATED RINGERS IV SOLN
INTRAVENOUS | Status: DC | PRN
  Administered 2012-10-03: 12:00:00 via INTRAVENOUS

## 2012-10-03 NOTE — Preoperative (Signed)
Beta Blockers   Reason not to administer Beta Blockers:Not Applicable 

## 2012-10-03 NOTE — Op Note (Signed)
[facility] [address] [place], [address]   ENDOSCOPY PROCEDURE REPORT  PATIENT: Rachel Cooke, Rachel Cooke  MR#: [id number] BIRTHDATE: 1919-08-01 , 89  yrs. old GENDER: Female  ENDOSCOPIST: Vida Rigger, MD REFERRED XB:JYNWGN Chilton Si, M.D.  PROCEDURE DATE:  10/03/2012 PROCEDURE:   EGD, diagnostic ASA CLASS:   Class III INDICATIONS:Dysphagia.  MEDICATIONS: propofol (Diprivan) 60mg  IV  TOPICAL ANESTHETIC:  DESCRIPTION OF PROCEDURE:   After the risks benefits and alternatives of the procedure were thoroughly explained, informed consent was obtained.  The Pentax Gastroscope [id number]  endoscope was introduced through the mouth and advanced to the second portion of the duodenum , limited by Without limitations.   The instrument was slowly withdrawn as the mucosa was fully examined.there was no significant  endoscopic finding and her esophagus was thoroughly evaluated at both the beginning and the end of the procedureand other than a small hiatal hernia with a widely patent fibrous ring which we elected not to dilate and there was some esophageal dysmotility but not requiring Botox and no therapy was done and the scope was removed and the patient tolerated the procedure well there was no obvious immediate complication         FINDINGS:1. Small hiatal hernia with widely patent fibrous ring2. Some esophageal dysmotility without significant spasm and did not look like achalasia 3. Otherwise within normal limits EGD  COMPLICATIONS:none  ENDOSCOPIC IMPRESSION:above   RECOMMENDATIONS:continue present management call me when necessary followup in a few monthsand I think she could eat more solid food if she had better supervision for each meal and she chewed her food well and eat slowly and drink plenty of fluids and made sure she sat up right for 2 hours after eating   REPEAT EXAM: as needed   _______________________________ Vida Rigger, MD eSigned:  Vida Rigger, MD 10/03/2012 12:26 PM    FA:OZHYQM Chilton Si, MD  PATIENT NAME:  [name], [name] MR#: [id number]

## 2012-10-03 NOTE — Anesthesia Postprocedure Evaluation (Signed)
Anesthesia Post Note  Patient: Rachel Cooke  Procedure(s) Performed: Procedure(s) (LRB): ESOPHAGOGASTRODUODENOSCOPY (EGD) (N/A) SAVORY DILATION (N/A) BOTOX INJECTION (N/A)  Anesthesia type: MAC  Patient location: PACU  Post pain: Pain level controlled  Post assessment: Post-op Vital signs reviewed  Last Vitals: BP 160/80  Temp(Src) 36.3 C (Oral)  Resp 30  SpO2 96%  Post vital signs: Reviewed  Level of consciousness: awake  Complications: No apparent anesthesia complications

## 2012-10-03 NOTE — Anesthesia Preprocedure Evaluation (Addendum)
Anesthesia Evaluation  Patient identified by MRN, date of birth, ID band Patient awake    Reviewed: Allergy & Precautions, H&P , NPO status , Patient's Chart, lab work & pertinent test results  History of Anesthesia Complications Negative for: history of anesthetic complications  Airway Mallampati: II TM Distance: >3 FB Neck ROM: Full    Dental  (+) Edentulous Upper, Partial Lower and Dental Advisory Given   Pulmonary former smoker,  breath sounds clear to auscultation  Pulmonary exam normal       Cardiovascular hypertension, Pt. on medications + CAD (non-obstructive by cath '03) and + Past MI (? non-STEMI ) + dysrhythmias + pacemaker (third degree AVB ) Rhythm:Regular Rate:Normal  4/14 ECHO: normal LVF, EF 60-65%, valves OK   Neuro/Psych PSYCHIATRIC DISORDERS Anxiety CVA (R hemiparesis, swallowing dysfunction), Residual Symptoms    GI/Hepatic Neg liver ROS, Esophageal impaction   Endo/Other  diabetes (glu 124), Well Controlled, Type 2, Oral Hypoglycemic AgentsHypothyroidism   Renal/GU negative Renal ROS     Musculoskeletal   Abdominal (+) + obese,   Peds  Hematology   Anesthesia Other Findings   Reproductive/Obstetrics                           Anesthesia Physical  Anesthesia Plan  ASA: III  Anesthesia Plan: MAC   Post-op Pain Management:    Induction: Intravenous  Airway Management Planned: Simple Face Mask  Additional Equipment:   Intra-op Plan:   Post-operative Plan:   Informed Consent: I have reviewed the patients History and Physical, chart, labs and discussed the procedure including the risks, benefits and alternatives for the proposed anesthesia with the patient or authorized representative who has indicated his/her understanding and acceptance.   Dental advisory given  Plan Discussed with: CRNA  Anesthesia Plan Comments:        Anesthesia Quick Evaluation

## 2012-10-03 NOTE — Transfer of Care (Signed)
Immediate Anesthesia Transfer of Care Note  Patient: NOELY KUHNLE  Procedure(s) Performed: Procedure(s): ESOPHAGOGASTRODUODENOSCOPY (EGD) (N/A) SAVORY DILATION (N/A) BOTOX INJECTION (N/A)  Patient Location: PACU  Anesthesia Type:MAC  Level of Consciousness: awake, alert , oriented and patient cooperative  Airway & Oxygen Therapy: Patient Spontanous Breathing and Patient connected to nasal cannula oxygen  Post-op Assessment: Report given to PACU RN and Post -op Vital signs reviewed and stable  Post vital signs: Reviewed and stable  Complications: No apparent anesthesia complications

## 2012-10-03 NOTE — Progress Notes (Signed)
Ivar Drape 11:33 AM  Subjective: Patient has actually been swallowing her pured food better and has no new complaints since she was seen recently in the office  Objective: Vital signs stable afebrile no acute distress exam please see pre-assessment evaluation  Assessment: Swallowing problems with recent CVA and significant food impaction  Plan: Okay to proceed with anesthesia an endoscopy and possible dilation versus Botox and her case was rediscussed with her daughter  [facility]

## 2012-10-06 ENCOUNTER — Encounter (HOSPITAL_COMMUNITY): Payer: Self-pay | Admitting: Gastroenterology

## 2012-10-06 DIAGNOSIS — I69919 Unspecified symptoms and signs involving cognitive functions following unspecified cerebrovascular disease: Secondary | ICD-10-CM | POA: Diagnosis not present

## 2012-10-06 DIAGNOSIS — G8190 Hemiplegia, unspecified affecting unspecified side: Secondary | ICD-10-CM | POA: Diagnosis not present

## 2012-10-06 DIAGNOSIS — R279 Unspecified lack of coordination: Secondary | ICD-10-CM | POA: Diagnosis not present

## 2012-10-06 DIAGNOSIS — I69959 Hemiplegia and hemiparesis following unspecified cerebrovascular disease affecting unspecified side: Secondary | ICD-10-CM | POA: Diagnosis not present

## 2012-10-06 DIAGNOSIS — R609 Edema, unspecified: Secondary | ICD-10-CM | POA: Diagnosis not present

## 2012-10-06 DIAGNOSIS — M6281 Muscle weakness (generalized): Secondary | ICD-10-CM | POA: Diagnosis not present

## 2012-10-07 DIAGNOSIS — G8190 Hemiplegia, unspecified affecting unspecified side: Secondary | ICD-10-CM | POA: Diagnosis not present

## 2012-10-07 DIAGNOSIS — I69959 Hemiplegia and hemiparesis following unspecified cerebrovascular disease affecting unspecified side: Secondary | ICD-10-CM | POA: Diagnosis not present

## 2012-10-07 DIAGNOSIS — M6281 Muscle weakness (generalized): Secondary | ICD-10-CM | POA: Diagnosis not present

## 2012-10-07 DIAGNOSIS — I69919 Unspecified symptoms and signs involving cognitive functions following unspecified cerebrovascular disease: Secondary | ICD-10-CM | POA: Diagnosis not present

## 2012-10-07 DIAGNOSIS — R279 Unspecified lack of coordination: Secondary | ICD-10-CM | POA: Diagnosis not present

## 2012-10-07 DIAGNOSIS — R609 Edema, unspecified: Secondary | ICD-10-CM | POA: Diagnosis not present

## 2012-10-08 DIAGNOSIS — M6281 Muscle weakness (generalized): Secondary | ICD-10-CM | POA: Diagnosis not present

## 2012-10-08 DIAGNOSIS — I69919 Unspecified symptoms and signs involving cognitive functions following unspecified cerebrovascular disease: Secondary | ICD-10-CM | POA: Diagnosis not present

## 2012-10-08 DIAGNOSIS — I69959 Hemiplegia and hemiparesis following unspecified cerebrovascular disease affecting unspecified side: Secondary | ICD-10-CM | POA: Diagnosis not present

## 2012-10-08 DIAGNOSIS — R279 Unspecified lack of coordination: Secondary | ICD-10-CM | POA: Diagnosis not present

## 2012-10-08 DIAGNOSIS — R609 Edema, unspecified: Secondary | ICD-10-CM | POA: Diagnosis not present

## 2012-10-08 DIAGNOSIS — G8190 Hemiplegia, unspecified affecting unspecified side: Secondary | ICD-10-CM | POA: Diagnosis not present

## 2012-10-09 DIAGNOSIS — M6281 Muscle weakness (generalized): Secondary | ICD-10-CM | POA: Diagnosis not present

## 2012-10-09 DIAGNOSIS — I69919 Unspecified symptoms and signs involving cognitive functions following unspecified cerebrovascular disease: Secondary | ICD-10-CM | POA: Diagnosis not present

## 2012-10-09 DIAGNOSIS — I69959 Hemiplegia and hemiparesis following unspecified cerebrovascular disease affecting unspecified side: Secondary | ICD-10-CM | POA: Diagnosis not present

## 2012-10-09 DIAGNOSIS — R279 Unspecified lack of coordination: Secondary | ICD-10-CM | POA: Diagnosis not present

## 2012-10-09 DIAGNOSIS — G8190 Hemiplegia, unspecified affecting unspecified side: Secondary | ICD-10-CM | POA: Diagnosis not present

## 2012-10-09 DIAGNOSIS — R609 Edema, unspecified: Secondary | ICD-10-CM | POA: Diagnosis not present

## 2012-10-10 ENCOUNTER — Encounter: Payer: Self-pay | Admitting: Nurse Practitioner

## 2012-10-10 DIAGNOSIS — I69919 Unspecified symptoms and signs involving cognitive functions following unspecified cerebrovascular disease: Secondary | ICD-10-CM | POA: Diagnosis not present

## 2012-10-10 DIAGNOSIS — G8190 Hemiplegia, unspecified affecting unspecified side: Secondary | ICD-10-CM | POA: Diagnosis not present

## 2012-10-10 DIAGNOSIS — M6281 Muscle weakness (generalized): Secondary | ICD-10-CM | POA: Diagnosis not present

## 2012-10-10 DIAGNOSIS — I69959 Hemiplegia and hemiparesis following unspecified cerebrovascular disease affecting unspecified side: Secondary | ICD-10-CM | POA: Diagnosis not present

## 2012-10-10 DIAGNOSIS — R609 Edema, unspecified: Secondary | ICD-10-CM | POA: Diagnosis not present

## 2012-10-10 DIAGNOSIS — R279 Unspecified lack of coordination: Secondary | ICD-10-CM | POA: Diagnosis not present

## 2012-10-12 DIAGNOSIS — M6281 Muscle weakness (generalized): Secondary | ICD-10-CM | POA: Diagnosis not present

## 2012-10-12 DIAGNOSIS — I69919 Unspecified symptoms and signs involving cognitive functions following unspecified cerebrovascular disease: Secondary | ICD-10-CM | POA: Diagnosis not present

## 2012-10-12 DIAGNOSIS — R609 Edema, unspecified: Secondary | ICD-10-CM | POA: Diagnosis not present

## 2012-10-12 DIAGNOSIS — R279 Unspecified lack of coordination: Secondary | ICD-10-CM | POA: Diagnosis not present

## 2012-10-12 DIAGNOSIS — I69959 Hemiplegia and hemiparesis following unspecified cerebrovascular disease affecting unspecified side: Secondary | ICD-10-CM | POA: Diagnosis not present

## 2012-10-12 DIAGNOSIS — G8190 Hemiplegia, unspecified affecting unspecified side: Secondary | ICD-10-CM | POA: Diagnosis not present

## 2012-10-13 DIAGNOSIS — I69919 Unspecified symptoms and signs involving cognitive functions following unspecified cerebrovascular disease: Secondary | ICD-10-CM | POA: Diagnosis not present

## 2012-10-13 DIAGNOSIS — R609 Edema, unspecified: Secondary | ICD-10-CM | POA: Diagnosis not present

## 2012-10-13 DIAGNOSIS — R279 Unspecified lack of coordination: Secondary | ICD-10-CM | POA: Diagnosis not present

## 2012-10-13 DIAGNOSIS — M6281 Muscle weakness (generalized): Secondary | ICD-10-CM | POA: Diagnosis not present

## 2012-10-13 DIAGNOSIS — G8190 Hemiplegia, unspecified affecting unspecified side: Secondary | ICD-10-CM | POA: Diagnosis not present

## 2012-10-13 DIAGNOSIS — I69959 Hemiplegia and hemiparesis following unspecified cerebrovascular disease affecting unspecified side: Secondary | ICD-10-CM | POA: Diagnosis not present

## 2012-10-14 ENCOUNTER — Ambulatory Visit (INDEPENDENT_AMBULATORY_CARE_PROVIDER_SITE_OTHER): Payer: Medicare Other | Admitting: Nurse Practitioner

## 2012-10-14 ENCOUNTER — Encounter: Payer: Self-pay | Admitting: Nurse Practitioner

## 2012-10-14 VITALS — BP 175/96 | HR 106 | Temp 97.5°F

## 2012-10-14 DIAGNOSIS — R600 Localized edema: Secondary | ICD-10-CM

## 2012-10-14 DIAGNOSIS — I6350 Cerebral infarction due to unspecified occlusion or stenosis of unspecified cerebral artery: Secondary | ICD-10-CM

## 2012-10-14 DIAGNOSIS — R609 Edema, unspecified: Secondary | ICD-10-CM | POA: Diagnosis not present

## 2012-10-14 DIAGNOSIS — M6281 Muscle weakness (generalized): Secondary | ICD-10-CM | POA: Diagnosis not present

## 2012-10-14 DIAGNOSIS — I69959 Hemiplegia and hemiparesis following unspecified cerebrovascular disease affecting unspecified side: Secondary | ICD-10-CM

## 2012-10-14 DIAGNOSIS — I69919 Unspecified symptoms and signs involving cognitive functions following unspecified cerebrovascular disease: Secondary | ICD-10-CM | POA: Diagnosis not present

## 2012-10-14 DIAGNOSIS — I639 Cerebral infarction, unspecified: Secondary | ICD-10-CM

## 2012-10-14 DIAGNOSIS — I69359 Hemiplegia and hemiparesis following cerebral infarction affecting unspecified side: Secondary | ICD-10-CM

## 2012-10-14 DIAGNOSIS — R279 Unspecified lack of coordination: Secondary | ICD-10-CM | POA: Diagnosis not present

## 2012-10-14 DIAGNOSIS — G8190 Hemiplegia, unspecified affecting unspecified side: Secondary | ICD-10-CM | POA: Diagnosis not present

## 2012-10-14 NOTE — Progress Notes (Signed)
[facility]  PATIENT: Rachel Cooke DOB: 09-05-1919   HISTORY FROM: patient, chart REASON FOR VISIT: routine follow up  HISTORY OF PRESENT ILLNESS:  Rachel Cooke is an 79 y.o. female with known hypertension, dyslipidemia, status post permanent pacemaker placement who was brought to ED on 06-28-12 due to AMS. In the chart primary team ahd spoken with family and most of this history is obtained from the patient's daughter who was at bedside. "Per the history obtained, this morning patient was found on the floor and confused at her independent living facility. She was then brought here to the emergency room, where a CT of the head and neck were done which were negative for any significant abnormalities. A urinalysis was suggestive of a UTI. She was given Rocephin, and family has noted some improvement in her mentation-while she has been here. Daughter at bedside does indicate that the family does not desire any heroic measures, they do however desire IV antibiotics and treatment of potentially reversible conditions. At this point, the family clearly states that did not wish to pursue any aggressive intervention like cardiac cath or stress testing."  While in the hospital it was noted she was not moving her right arm as well as usual but unclear if it was her AMS or possible CVA. After her mentation started to clear it became obvious she was not moving her right side as well. repeat CT head showed a subtle hypodensity in the left parietal corona radiata/centrum semiovale appears more conspicuous than previous scan.   UPDATE 10/14/12 (LL):  Patient comes to office for first office visit post-stroke.  She has gone from living independently at [facility] to skilled nursing at [facility] which has been a hard adjustment for her.  She is working with PT and reports that she can stand and walk about 30 feet with a walker.  She is not able to ambulate without assistance.  She is a high  fall risk.  Her right side has been affected, which was her dominant side.  She had to be hospitalized again in June for esophageal impaction.  Entire esophagus was impacted, per Dr. Ewing Schlein, was not related to stroke.  She is taking Plavix for secondary stroke prevention. Patient denies medication side effects, with no signs of bleeding, but mild bruising. She has moderate depression at the loss of her independence, but is hopeful she will gain back some of her functional status.  REVIEW OF SYSTEMS: Full 14 system review of systems performed and notable only for: constitutional: N/A  cardiovascular: swelling in legs respiratory: N/A endocrine: increased thirst  ear/nose/throat: N/A  Hematology/Lymph: easy bleeding, easy bruising musculoskeletal: N/A skin: N/A genitourinary: N/A Gastrointestinal: incontinence Allergy/immunology: N/A Neurological: confusion, weakness sleep: N/A psychiatric: depression, anxiety   ALLERGIES: Allergies  Allergen Reactions  . Lactose Intolerance (Gi)     HOME MEDICATIONS: Outpatient Prescriptions Prior to Visit  Medication Sig Dispense Refill  . acetaminophen (TYLENOL) 650 MG CR tablet Take 650 mg by mouth 2 (two) times daily.      Marland Kitchen antiseptic oral rinse (BIOTENE) LIQD 15 mLs by Mouth Rinse route as directed. Before meals and at bedtime      . cetirizine (ZYRTEC) 10 MG tablet Take 10 mg by mouth daily as needed for allergies.      . cloNIDine (CATAPRES - DOSED IN MG/24 HR) 0.1 mg/24hr patch Place 1 patch onto the skin once a week.      . clopidogrel (PLAVIX) 75 MG tablet Take  75 mg by mouth daily.      Marland Kitchen escitalopram (LEXAPRO) 20 MG tablet Take 20 mg by mouth daily.      . lansoprazole (PREVACID SOLUTAB) 15 MG disintegrating tablet Take 15 mg by mouth daily.      Marland Kitchen levothyroxine (SYNTHROID, LEVOTHROID) 75 MCG tablet Take 75 mcg by mouth daily before breakfast.      . liver oil-zinc oxide (DESITIN) 40 % ointment Apply 1 application topically as needed.  Apply to rash on buttox/inner thighs after each BM      . losartan (COZAAR) 50 MG tablet Take 1 tablet (50 mg total) by mouth daily.      . Menthol-Zinc Oxide (CALMOSEPTINE) 0.44-20.625 % OINT Apply 1 application topically as needed. Apply to peri-anal skin and inner buttox after each incontinent episode and PRN      . Multiple Vitamins-Minerals (MULTIVITAMIN WITH MINERALS) tablet Take 1 tablet by mouth daily.      . sucralfate (CARAFATE) 1 GM/10ML suspension Take 1 g by mouth 2 (two) times daily.       No facility-administered medications prior to visit.    PAST MEDICAL HISTORY: Past Medical History  Diagnosis Date  . Cancer   . Thyroid disease   . Hypertension   . High cholesterol   . Dendritic keratitis 2012  . Malignant neoplasm of breast (female), unspecified site 1970    S/P Rt radical mastectomy  . Unspecified hypothyroidism 2009  . Type II or unspecified type diabetes mellitus without mention of complication, not stated as uncontrolled 2003  . Unspecified vitamin D deficiency 2009  . Pure hyperglyceridemia 2004  . Anxiety state, unspecified   . Macular degeneration (senile) of retina, unspecified 2012  . Unspecified glaucoma(365.9) 2013  . Tinnitus 2007  . Unspecified essential hypertension 2000  . Coronary atherosclerosis of native coronary artery 2003    non obstructive by Cath 2003  . Atrioventricular block, complete 2003    s/p PPM 2003, generator chnage 2011  . Allergic rhinitis due to pollen   . Acquired cyst of kidney 2002  . Osteoarthrosis, unspecified whether generalized or localized, unspecified site 2013  . Unspecified arthropathy, pelvic region and thigh 2010  . Senile osteoporosis 2003  . Abnormality of gait 2007  . Edema 2003  . Personal history of colonic polyps      s/p colonoscopy/polypectomy 2003  . Cardiac pacemaker in situ 2003  . CVA (cerebral infarction) 06/28/2012    rt hemiparesis, dysphagia  . NSTEMI (non-ST elevated myocardial infarction)  06/28/2012  . Urinary retention 07/07/2012  . Pacemaker   . Stroke 06/28/2012  . Food impaction of esophagus 08/15/2012  . Esophageal dysmotility 08/19/2012    PAST SURGICAL HISTORY: Past Surgical History  Procedure Laterality Date  . Mastectomy Right 1970    Cancer  . Insert / replace / remove pacemaker    . Esophagogastroduodenoscopy N/A 08/15/2012    Procedure: ESOPHAGOGASTRODUODENOSCOPY (EGD);  Surgeon: Petra Kuba, MD;  Location: [facility] ENDOSCOPY;  Service: Endoscopy;  Laterality: N/A;  . Esophagoscopy N/A 08/15/2012    Procedure: ESOPHAGOSCOPY;  Surgeon: Darletta Moll, MD;  Location: [facility] OR;  Service: ENT;  Laterality: N/A;  . Foreign body removal esophageal N/A 08/15/2012    Procedure: REMOVAL FOREIGN BODY ESOPHAGEAL;  Surgeon: Darletta Moll, MD;  Location: [facility] OR;  Service: ENT;  Laterality: N/A;  . Esophagogastroduodenoscopy N/A 08/15/2012    Procedure: ESOPHAGOGASTRODUODENOSCOPY (EGD);  Surgeon: Petra Kuba, MD;  Location: [facility] OR;  Service: Endoscopy;  Laterality: N/A;  . Esophagogastroduodenoscopy N/A 10/03/2012    Procedure: ESOPHAGOGASTRODUODENOSCOPY (EGD);  Surgeon: Petra Kuba, MD;  Location: [facility] ENDOSCOPY;  Service: Endoscopy;  Laterality: N/A;  . Savory dilation N/A 10/03/2012    Procedure: SAVORY DILATION;  Surgeon: Petra Kuba, MD;  Location: [facility] ENDOSCOPY;  Service: Endoscopy;  Laterality: N/A;  . Botox injection N/A 10/03/2012    Procedure: BOTOX INJECTION;  Surgeon: Petra Kuba, MD;  Location: [facility] ENDOSCOPY;  Service: Endoscopy;  Laterality: N/A;    FAMILY HISTORY: No family history on file.  SOCIAL HISTORY: History   Social History  . Marital Status: Widowed    Spouse Name: N/A    Number of Children: N/A  . Years of Education: N/A   Occupational History  . Not on file.   Social History Main Topics  . Smoking status: Never Smoker   . Smokeless tobacco: Not on file  . Alcohol Use: No  . Drug Use: Not on file  . Sexually Active: No   Other Topics Concern  . Not on file     Social History Narrative  . No narrative on file     PHYSICAL EXAM  Filed Vitals:   10/14/12 1412  BP: 175/96  Pulse: 106  Temp: 97.5 F (36.4 C)  TempSrc: Oral   There is no weight on file to calculate BMI.   Mentation: Alert oriented to time, place, history taking, language fluent, and casual conversation Frail elderly [pleasant Caucasian lady not in distress.Awake alert. Afebrile. Head is nontraumatic. Neck is supple without bruit. Hearing is normal. Cardiac exam no murmur or gallop. Lungs are clear to auscultation. Distal pulses are well felt.   Neurological Exam: Awake alert oriented x3. Diminished attention, registration and recall. Speech is slightly nonfluent but no aphasia, dysarthria or apraxia. Follows two-step commands well. Extraocular movements are full range without nystagmus. Fundi were not visualized. Vision acuity seems adequate. Visual fields appear full. Face with mild right lower weakness. Tongue is midline. Palatal movements are normal. Motor system exam reveals dense right hemiplegia with 2/5 strength on the right with diminished tone. Normal antigravity strength on the left side. Gait was not tested for safety concerns, patient in wheelchair. Diminished sensation on the right.   Musculoskeletal: Edema of right arm; elbow to hand.  Ecchymosis and rubor of right wrist to distal joint of fingers.  DIAGNOSTIC DATA (LABS, IMAGING, TESTING) - I reviewed patient records, labs, notes, testing and imaging myself where available.  Lab Results  Component Value Date   WBC 7.6 08/18/2012   HGB 12.0 08/18/2012   HCT 36.6 08/18/2012   MCV 95.1 08/18/2012   PLT 165 08/18/2012      Component Value Date/Time   NA 142 08/18/2012 0505   K 3.5 08/18/2012 0505   CL 105 08/18/2012 0505   CO2 31 08/18/2012 0505   GLUCOSE 124* 08/18/2012 0505   BUN 5* 08/18/2012 0505   CREATININE 0.50 08/18/2012 0505   CALCIUM 8.8 08/18/2012 0505   PROT 6.2 08/18/2012 0505   ALBUMIN 2.7* 08/18/2012  0505   AST 25 08/18/2012 0505   ALT 15 08/18/2012 0505   ALKPHOS 43 08/18/2012 0505   BILITOT 0.5 08/18/2012 0505   GFRNONAA 83* 08/18/2012 0505   GFRAA >90 08/18/2012 0505   Lab Results  Component Value Date   CHOL 174 07/01/2012   HDL 42 07/01/2012   LDLCALC 99 07/01/2012   TRIG 167* 07/01/2012   CHOLHDL 4.1 07/01/2012   Lab Results  Component Value Date   HGBA1C 6.2* 07/01/2012   No results found for this basename: [id number]   Lab Results  Component Value Date   TSH 0.706 08/16/2012    CT of the brain  06/29/2012 1. Subtle hypodensity in the left parietal corona radiata/centrum semiovale appears more conspicuous than on yesterday's exam. This could be an indicator of a small white matter infarct, inflammatory focus, or injury. No hemorrhage identified.  06/28/2012 No acute abnormality.  CT Cervical Spine 06/28/2012 Mild degenerative change. Negative for fracture.  MRI/A of the brain pacer  2D Echocardiogram EF 60-65% with no source of embolus.  Carotid Doppler No evidence of ICA stenosis bilaterally  CXR  06/30/2012 Stable. No acute cardiopulmonary abnormality.  06/28/2012 Cardiomegaly without evidence of acute cardiopulmonary disease.  EKG normal sinus rhythm.   ASSESSMENT AND PLAN Rachel Cooke is a 79 y.o. female with left parietal corona radiata/centrum semiovale infarct. Infarct etiology likley small vessel disease.  Patient with resultant right hemiparesis.    Right arm edema and pain with movement since last hospitalization, we will order venous doppler to evaluate for DVT.  Continue clopidogrel 75 mg orally every day  for secondary stroke prevention and maintain strict control of hypertension with blood pressure goal below 130/90, diabetes with hemoglobin A1c goal below 6.5% and lipids with LDL cholesterol goal below 100 mg/dL.  Followup in 3 months.  Cassy Sprowl NP-C 10/14/2012, 2:30 PM  [facility] [address], [address] [place], [place]  [address] [phone]  I have personally examined this patient, reviewed pertinent data, developed plan of care and discussed with patient and agree with above.  Delia Heady, MD

## 2012-10-14 NOTE — Patient Instructions (Addendum)
Continue clopidogrel 75 mg orally every day  for secondary stroke prevention and maintain strict control of hypertension with blood pressure goal below 130/90, diabetes with hemoglobin A1c goal below 6.5% and lipids with LDL cholesterol goal below 100 mg/dL. Followup in the future with me in 3 months.  We will order venous Doppler Study of Right Subclavian vein, to evaluate for DVT.  Follow up in 3 months.

## 2012-10-15 DIAGNOSIS — I69959 Hemiplegia and hemiparesis following unspecified cerebrovascular disease affecting unspecified side: Secondary | ICD-10-CM | POA: Diagnosis not present

## 2012-10-15 DIAGNOSIS — R609 Edema, unspecified: Secondary | ICD-10-CM | POA: Diagnosis not present

## 2012-10-15 DIAGNOSIS — M6281 Muscle weakness (generalized): Secondary | ICD-10-CM | POA: Diagnosis not present

## 2012-10-15 DIAGNOSIS — R279 Unspecified lack of coordination: Secondary | ICD-10-CM | POA: Diagnosis not present

## 2012-10-15 DIAGNOSIS — G8190 Hemiplegia, unspecified affecting unspecified side: Secondary | ICD-10-CM | POA: Diagnosis not present

## 2012-10-15 DIAGNOSIS — I69919 Unspecified symptoms and signs involving cognitive functions following unspecified cerebrovascular disease: Secondary | ICD-10-CM | POA: Diagnosis not present

## 2012-10-16 ENCOUNTER — Encounter: Payer: Self-pay | Admitting: Geriatric Medicine

## 2012-10-16 ENCOUNTER — Non-Acute Institutional Stay (SKILLED_NURSING_FACILITY): Payer: Medicare Other | Admitting: Geriatric Medicine

## 2012-10-16 ENCOUNTER — Telehealth: Payer: Self-pay | Admitting: Nurse Practitioner

## 2012-10-16 DIAGNOSIS — I10 Essential (primary) hypertension: Secondary | ICD-10-CM

## 2012-10-16 DIAGNOSIS — R609 Edema, unspecified: Secondary | ICD-10-CM | POA: Insufficient documentation

## 2012-10-16 DIAGNOSIS — G8190 Hemiplegia, unspecified affecting unspecified side: Secondary | ICD-10-CM | POA: Diagnosis not present

## 2012-10-16 DIAGNOSIS — I639 Cerebral infarction, unspecified: Secondary | ICD-10-CM

## 2012-10-16 DIAGNOSIS — I6350 Cerebral infarction due to unspecified occlusion or stenosis of unspecified cerebral artery: Secondary | ICD-10-CM | POA: Diagnosis not present

## 2012-10-16 DIAGNOSIS — I69919 Unspecified symptoms and signs involving cognitive functions following unspecified cerebrovascular disease: Secondary | ICD-10-CM | POA: Diagnosis not present

## 2012-10-16 DIAGNOSIS — I69959 Hemiplegia and hemiparesis following unspecified cerebrovascular disease affecting unspecified side: Secondary | ICD-10-CM | POA: Diagnosis not present

## 2012-10-16 DIAGNOSIS — R279 Unspecified lack of coordination: Secondary | ICD-10-CM | POA: Diagnosis not present

## 2012-10-16 DIAGNOSIS — M6281 Muscle weakness (generalized): Secondary | ICD-10-CM | POA: Diagnosis not present

## 2012-10-16 DIAGNOSIS — E119 Type 2 diabetes mellitus without complications: Secondary | ICD-10-CM | POA: Diagnosis not present

## 2012-10-16 DIAGNOSIS — K224 Dyskinesia of esophagus: Secondary | ICD-10-CM

## 2012-10-16 MED ORDER — PANTOPRAZOLE SODIUM 40 MG PO TBEC: 40.0000 mg | DELAYED_RELEASE_TABLET | Freq: Every day | ORAL | Status: DC

## 2012-10-16 MED ORDER — METOPROLOL SUCCINATE ER 25 MG PO TB24: 25.0000 mg | ORAL_TABLET | Freq: Every day | ORAL | Status: DC

## 2012-10-16 MED ORDER — OMEPRAZOLE 20 MG PO CPDR: 20.0000 mg | DELAYED_RELEASE_CAPSULE | Freq: Every day | ORAL | Status: DC

## 2012-10-16 NOTE — Progress Notes (Signed)
Patient ID: Rachel Cooke, female   DOB: March 22, 1919, 79 y.o.   MRN: [id number] [facility] [id number])  Code Status: Living Will, DNR  Contact Information   Name Relation Home Work [place] Daughter [phone]  [phone]      Chief Complaint  Patient presents with  . Medical Managment of Chronic Issues    HPI: This is a 79 y.o. female resident of [facility], Skilled Nursing section evaluated today for management of ongoing medical issues.  Patient reports she is feeling well, PT and OT report patient continues to make progress with post CVA therapies.  The patient underwent repeat endoscopy with Dr.Magod on August 1. He is very pleased with the healing of her throat and esophagus, did not see any reason to do a dilatation or Botox tx. Pt. Is to continue pureed diet and followup with him in a few months. More solid food could be offered if she had appropriate supervision. Patient's BP/P measures remain stable, CBGs satisfactory. Patient is tolerating restricted diet, meal intake >50 at most meals. 75%. Weight with mild fluctuation in the last month.    Allergies  Allergen Reactions  . Lactose Intolerance (Gi)    Medications Reviewed  DATA REVIEWED:  Laboratory Studies:   [facility], external  08/21/2012: Glucose 101, BUN 5, creatinine 0.46, sodium 139, potassium 3.6. LFTs WNL. Albumin 3.0 GFR 88  TSH 2.48  09/06/2012 urinalysis. Large blood, 30 protein, positive nitrate, large leukocyte esterase, many bacteria  09/10/2102: WBC 5.2, Hgb 11.9, Hct 36.3, plt 199   Glu 121, BUN 9, Cr. .46, Na 141, K+ 3.7 Albumin 3.4  TC 162, TG 242, HDL 31, LDL 94  A1C 6.0  Review of Systems  DATA OBTAINED: from patient, nurse, medical record,  GENERAL: Feels well   No fevers, fatigue, change in appetite or weight SKIN: No itch, rash or open wounds EYES: No eye pain, dryness or itching  No change in vision EARS: No earache, change in  hearing NOSE: No congestion, drainage or bleeding MOUTH/THROAT: No mouth or tooth pain  No sore throat No difficulty chewing or swallowing RESPIRATORY: No cough, wheezing, SOB CARDIAC: No chest pain, palpitations  Mild swelling rt.arm, ankles. GI: No abdominal pain  No N/V/D or constipation  No heartburn or reflux  GU: No dysuria, frequency or urgency  No change in urine volume or character  MUSCULOSKELETAL: No joint pain, swelling or stiffness  No back pain  No muscle ache, pain,  Weakness rt arm/ leg NEUROLOGIC: No dizziness, fainting, headache,  No change in mental status.  Rt. hemiparesis PSYCHIATRIC: No feelings of anxiety, depression Sleeps well.  No behavior issue.   Physical Exam Filed Vitals:   10/16/12 1507  BP: 155/90  Pulse: 96  Temp: 96 F (35.6 C)  Resp: 20  Weight: 168 lb 12.8 oz (76.567 kg)  SpO2: 93%   Body mass index is 27.26 kg/(m^2).  GENERAL APPEARANCE: No acute distress, appropriately groomed, Overweight body habitus. Alert, pleasant, conversant. HEAD: Normocephalic, atraumatic EYES: Conjunctiva/lids clear. Pupils round, reactive. EARS: Decreased Hearing   NOSE: No deformity or discharge. MOUTH/THROAT: Lips w/o lesions. Oral mucosa, tongue moist, w/o lesion. Oropharynx w/o redness or lesions.  NECK: Supple, full ROM. No thyroid tenderness, enlargement or nodule LYMPHATICS: No head, neck or supraclavicular adenopathy RESPIRATORY: Breathing is even, unlabored. Lung sounds are clear and full.  CARDIOVASCULAR: Heart RRR. No murmur or extra heart sounds  EDEMA: Trace bilateral ankle edema GASTROINTESTINAL: Abdomen is soft, non-tender,  not distended w/ normal bowel sounds.  GENITOURINARY: Bladder non tender, not distended. MUSCULOSKELETAL:  Back with moderate kyphosis, No scoliosis or spinal process tenderness.   NEUROLOGIC: Oriented to time, place, person. Speech clear, no tremor.  Rt. Arm with mild edema, stronger (though not baseline) hand grip, more  coordinarted movement of hand/ arm. Rt. Leg weakness evident, improved ROM PSYCHIATRIC: Mood and affect appropriate to situation  ASSESSMENT/PLAN  HTN (hypertension) BP satisfactory with current medication, P a bit high (90s). Stop clonidine patch (not covered by insurance), restart metoprolol (was taking this prior to CVA)  Esophageal dysmotility Patient continues to tolerate pured diet. Status post EGD with no significant findings. Carafate has been discontinued, continue PPI.  Type II or unspecified type diabetes mellitus without mention of complication, not stated as uncontrolled Review of record shows patient's fasting blood sugars have remained well controlled off medications, range 108-123.  CVA (cerebral infarction) Right-sided hemiparesis continues to limit this patient's mobility and ADL function. She continues to require skilled level of care. Patient continues to make progress with PT and OT.  Edema Edema of right arm has been persistent since CVA. No significant pain or redness in this limb, edema is mild. Patient has history of right radical mastectomy likely has decreased lymph exchange. Neurology has recommended a venous Doppler study to rule out DVT.   Follow up: Routine or as needed  Wonder Donaway T.Nakeda Lebron, NP-C 10/16/2012

## 2012-10-16 NOTE — Assessment & Plan Note (Signed)
Right-sided hemiparesis continues to limit this patient's mobility and ADL function. She continues to require skilled level of care. Patient continues to make progress with PT and OT.

## 2012-10-16 NOTE — Assessment & Plan Note (Signed)
Edema of right arm has been persistent since CVA. No significant pain or redness in this limb, edema is mild. Patient has history of right radical mastectomy likely has decreased lymph exchange. Neurology has recommended a venous Doppler study to rule out DVT.

## 2012-10-16 NOTE — Assessment & Plan Note (Signed)
Review of record shows patient's fasting blood sugars have remained well controlled off medications, range 108-123.

## 2012-10-16 NOTE — Assessment & Plan Note (Signed)
BP satisfactory with current medication, P a bit high (90s). Stop clonidine patch (not covered by insurance), restart metoprolol (was taking this prior to CVA)

## 2012-10-16 NOTE — Assessment & Plan Note (Signed)
Patient continues to tolerate pured diet. Status post EGD with no significant findings. Carafate has been discontinued, continue PPI.

## 2012-10-17 DIAGNOSIS — G8190 Hemiplegia, unspecified affecting unspecified side: Secondary | ICD-10-CM | POA: Diagnosis not present

## 2012-10-17 DIAGNOSIS — R279 Unspecified lack of coordination: Secondary | ICD-10-CM | POA: Diagnosis not present

## 2012-10-17 DIAGNOSIS — R609 Edema, unspecified: Secondary | ICD-10-CM | POA: Diagnosis not present

## 2012-10-17 DIAGNOSIS — I69959 Hemiplegia and hemiparesis following unspecified cerebrovascular disease affecting unspecified side: Secondary | ICD-10-CM | POA: Diagnosis not present

## 2012-10-17 DIAGNOSIS — M6281 Muscle weakness (generalized): Secondary | ICD-10-CM | POA: Diagnosis not present

## 2012-10-17 DIAGNOSIS — I69919 Unspecified symptoms and signs involving cognitive functions following unspecified cerebrovascular disease: Secondary | ICD-10-CM | POA: Diagnosis not present

## 2012-10-17 NOTE — Telephone Encounter (Signed)
[facility] called again to see if we had a date and time set for the patient's doppler.  I told them we don't do subclavian dopplers and they can have it done at their facility or patient will have to go to the hospital.  She will get back with [place] if I need to fax the order.

## 2012-10-20 ENCOUNTER — Other Ambulatory Visit (HOSPITAL_COMMUNITY): Payer: Self-pay | Admitting: Neurology

## 2012-10-20 DIAGNOSIS — M6281 Muscle weakness (generalized): Secondary | ICD-10-CM | POA: Diagnosis not present

## 2012-10-20 DIAGNOSIS — G8190 Hemiplegia, unspecified affecting unspecified side: Secondary | ICD-10-CM | POA: Diagnosis not present

## 2012-10-20 DIAGNOSIS — M7989 Other specified soft tissue disorders: Secondary | ICD-10-CM

## 2012-10-20 DIAGNOSIS — M25521 Pain in right elbow: Secondary | ICD-10-CM

## 2012-10-20 DIAGNOSIS — R279 Unspecified lack of coordination: Secondary | ICD-10-CM | POA: Diagnosis not present

## 2012-10-20 DIAGNOSIS — R609 Edema, unspecified: Secondary | ICD-10-CM | POA: Diagnosis not present

## 2012-10-20 DIAGNOSIS — I69919 Unspecified symptoms and signs involving cognitive functions following unspecified cerebrovascular disease: Secondary | ICD-10-CM | POA: Diagnosis not present

## 2012-10-20 DIAGNOSIS — I69959 Hemiplegia and hemiparesis following unspecified cerebrovascular disease affecting unspecified side: Secondary | ICD-10-CM | POA: Diagnosis not present

## 2012-10-21 ENCOUNTER — Ambulatory Visit (HOSPITAL_COMMUNITY)
Admission: RE | Admit: 2012-10-21 | Discharge: 2012-10-21 | Disposition: A | Discharge status: Home / Self Care | Payer: Medicare Other | Source: Ambulatory Visit | Attending: Neurology | Admitting: Neurology

## 2012-10-21 DIAGNOSIS — M7989 Other specified soft tissue disorders: Secondary | ICD-10-CM | POA: Insufficient documentation

## 2012-10-21 DIAGNOSIS — G8190 Hemiplegia, unspecified affecting unspecified side: Secondary | ICD-10-CM | POA: Diagnosis not present

## 2012-10-21 DIAGNOSIS — M25529 Pain in unspecified elbow: Secondary | ICD-10-CM | POA: Diagnosis not present

## 2012-10-21 DIAGNOSIS — I69919 Unspecified symptoms and signs involving cognitive functions following unspecified cerebrovascular disease: Secondary | ICD-10-CM | POA: Diagnosis not present

## 2012-10-21 DIAGNOSIS — I69959 Hemiplegia and hemiparesis following unspecified cerebrovascular disease affecting unspecified side: Secondary | ICD-10-CM | POA: Diagnosis not present

## 2012-10-21 DIAGNOSIS — M6281 Muscle weakness (generalized): Secondary | ICD-10-CM | POA: Diagnosis not present

## 2012-10-21 DIAGNOSIS — R279 Unspecified lack of coordination: Secondary | ICD-10-CM | POA: Diagnosis not present

## 2012-10-21 DIAGNOSIS — R609 Edema, unspecified: Secondary | ICD-10-CM | POA: Diagnosis not present

## 2012-10-21 DIAGNOSIS — M79609 Pain in unspecified limb: Secondary | ICD-10-CM | POA: Diagnosis not present

## 2012-10-21 DIAGNOSIS — M25521 Pain in right elbow: Secondary | ICD-10-CM

## 2012-10-21 NOTE — Progress Notes (Signed)
Right upper extremity venous duplex:  No evidence of DVT or superficial thrombosis.    

## 2012-10-22 DIAGNOSIS — R279 Unspecified lack of coordination: Secondary | ICD-10-CM | POA: Diagnosis not present

## 2012-10-22 DIAGNOSIS — G8190 Hemiplegia, unspecified affecting unspecified side: Secondary | ICD-10-CM | POA: Diagnosis not present

## 2012-10-22 DIAGNOSIS — R609 Edema, unspecified: Secondary | ICD-10-CM | POA: Diagnosis not present

## 2012-10-22 DIAGNOSIS — I69919 Unspecified symptoms and signs involving cognitive functions following unspecified cerebrovascular disease: Secondary | ICD-10-CM | POA: Diagnosis not present

## 2012-10-22 DIAGNOSIS — I69959 Hemiplegia and hemiparesis following unspecified cerebrovascular disease affecting unspecified side: Secondary | ICD-10-CM | POA: Diagnosis not present

## 2012-10-22 DIAGNOSIS — M6281 Muscle weakness (generalized): Secondary | ICD-10-CM | POA: Diagnosis not present

## 2012-10-23 DIAGNOSIS — I69959 Hemiplegia and hemiparesis following unspecified cerebrovascular disease affecting unspecified side: Secondary | ICD-10-CM | POA: Diagnosis not present

## 2012-10-23 DIAGNOSIS — M6281 Muscle weakness (generalized): Secondary | ICD-10-CM | POA: Diagnosis not present

## 2012-10-23 DIAGNOSIS — R609 Edema, unspecified: Secondary | ICD-10-CM | POA: Diagnosis not present

## 2012-10-23 DIAGNOSIS — I69919 Unspecified symptoms and signs involving cognitive functions following unspecified cerebrovascular disease: Secondary | ICD-10-CM | POA: Diagnosis not present

## 2012-10-23 DIAGNOSIS — G8190 Hemiplegia, unspecified affecting unspecified side: Secondary | ICD-10-CM | POA: Diagnosis not present

## 2012-10-23 DIAGNOSIS — R279 Unspecified lack of coordination: Secondary | ICD-10-CM | POA: Diagnosis not present

## 2012-10-24 DIAGNOSIS — R279 Unspecified lack of coordination: Secondary | ICD-10-CM | POA: Diagnosis not present

## 2012-10-24 DIAGNOSIS — R609 Edema, unspecified: Secondary | ICD-10-CM | POA: Diagnosis not present

## 2012-10-24 DIAGNOSIS — I69959 Hemiplegia and hemiparesis following unspecified cerebrovascular disease affecting unspecified side: Secondary | ICD-10-CM | POA: Diagnosis not present

## 2012-10-24 DIAGNOSIS — M6281 Muscle weakness (generalized): Secondary | ICD-10-CM | POA: Diagnosis not present

## 2012-10-24 DIAGNOSIS — I69919 Unspecified symptoms and signs involving cognitive functions following unspecified cerebrovascular disease: Secondary | ICD-10-CM | POA: Diagnosis not present

## 2012-10-24 DIAGNOSIS — G8190 Hemiplegia, unspecified affecting unspecified side: Secondary | ICD-10-CM | POA: Diagnosis not present

## 2012-10-25 DIAGNOSIS — R279 Unspecified lack of coordination: Secondary | ICD-10-CM | POA: Diagnosis not present

## 2012-10-25 DIAGNOSIS — G8190 Hemiplegia, unspecified affecting unspecified side: Secondary | ICD-10-CM | POA: Diagnosis not present

## 2012-10-25 DIAGNOSIS — I69919 Unspecified symptoms and signs involving cognitive functions following unspecified cerebrovascular disease: Secondary | ICD-10-CM | POA: Diagnosis not present

## 2012-10-25 DIAGNOSIS — I69959 Hemiplegia and hemiparesis following unspecified cerebrovascular disease affecting unspecified side: Secondary | ICD-10-CM | POA: Diagnosis not present

## 2012-10-25 DIAGNOSIS — M6281 Muscle weakness (generalized): Secondary | ICD-10-CM | POA: Diagnosis not present

## 2012-10-25 DIAGNOSIS — R609 Edema, unspecified: Secondary | ICD-10-CM | POA: Diagnosis not present

## 2012-10-27 DIAGNOSIS — I69919 Unspecified symptoms and signs involving cognitive functions following unspecified cerebrovascular disease: Secondary | ICD-10-CM | POA: Diagnosis not present

## 2012-10-27 DIAGNOSIS — M6281 Muscle weakness (generalized): Secondary | ICD-10-CM | POA: Diagnosis not present

## 2012-10-27 DIAGNOSIS — R609 Edema, unspecified: Secondary | ICD-10-CM | POA: Diagnosis not present

## 2012-10-27 DIAGNOSIS — R279 Unspecified lack of coordination: Secondary | ICD-10-CM | POA: Diagnosis not present

## 2012-10-27 DIAGNOSIS — G8190 Hemiplegia, unspecified affecting unspecified side: Secondary | ICD-10-CM | POA: Diagnosis not present

## 2012-10-27 DIAGNOSIS — I69959 Hemiplegia and hemiparesis following unspecified cerebrovascular disease affecting unspecified side: Secondary | ICD-10-CM | POA: Diagnosis not present

## 2012-10-28 DIAGNOSIS — G8190 Hemiplegia, unspecified affecting unspecified side: Secondary | ICD-10-CM | POA: Diagnosis not present

## 2012-10-28 DIAGNOSIS — R609 Edema, unspecified: Secondary | ICD-10-CM | POA: Diagnosis not present

## 2012-10-28 DIAGNOSIS — I69919 Unspecified symptoms and signs involving cognitive functions following unspecified cerebrovascular disease: Secondary | ICD-10-CM | POA: Diagnosis not present

## 2012-10-28 DIAGNOSIS — M6281 Muscle weakness (generalized): Secondary | ICD-10-CM | POA: Diagnosis not present

## 2012-10-28 DIAGNOSIS — I69959 Hemiplegia and hemiparesis following unspecified cerebrovascular disease affecting unspecified side: Secondary | ICD-10-CM | POA: Diagnosis not present

## 2012-10-28 DIAGNOSIS — R279 Unspecified lack of coordination: Secondary | ICD-10-CM | POA: Diagnosis not present

## 2012-10-29 ENCOUNTER — Ambulatory Visit (HOSPITAL_COMMUNITY): Payer: Medicare Other

## 2012-10-29 DIAGNOSIS — G8190 Hemiplegia, unspecified affecting unspecified side: Secondary | ICD-10-CM | POA: Diagnosis not present

## 2012-10-29 DIAGNOSIS — R279 Unspecified lack of coordination: Secondary | ICD-10-CM | POA: Diagnosis not present

## 2012-10-29 DIAGNOSIS — M6281 Muscle weakness (generalized): Secondary | ICD-10-CM | POA: Diagnosis not present

## 2012-10-29 DIAGNOSIS — R609 Edema, unspecified: Secondary | ICD-10-CM | POA: Diagnosis not present

## 2012-10-29 DIAGNOSIS — I69919 Unspecified symptoms and signs involving cognitive functions following unspecified cerebrovascular disease: Secondary | ICD-10-CM | POA: Diagnosis not present

## 2012-10-29 DIAGNOSIS — I69959 Hemiplegia and hemiparesis following unspecified cerebrovascular disease affecting unspecified side: Secondary | ICD-10-CM | POA: Diagnosis not present

## 2012-10-30 DIAGNOSIS — I69959 Hemiplegia and hemiparesis following unspecified cerebrovascular disease affecting unspecified side: Secondary | ICD-10-CM | POA: Diagnosis not present

## 2012-10-30 DIAGNOSIS — G8190 Hemiplegia, unspecified affecting unspecified side: Secondary | ICD-10-CM | POA: Diagnosis not present

## 2012-10-30 DIAGNOSIS — I69919 Unspecified symptoms and signs involving cognitive functions following unspecified cerebrovascular disease: Secondary | ICD-10-CM | POA: Diagnosis not present

## 2012-10-30 DIAGNOSIS — R279 Unspecified lack of coordination: Secondary | ICD-10-CM | POA: Diagnosis not present

## 2012-10-30 DIAGNOSIS — R609 Edema, unspecified: Secondary | ICD-10-CM | POA: Diagnosis not present

## 2012-10-30 DIAGNOSIS — M6281 Muscle weakness (generalized): Secondary | ICD-10-CM | POA: Diagnosis not present

## 2012-10-31 ENCOUNTER — Non-Acute Institutional Stay (SKILLED_NURSING_FACILITY): Payer: Medicare Other | Admitting: Geriatric Medicine

## 2012-10-31 ENCOUNTER — Encounter: Payer: Self-pay | Admitting: Geriatric Medicine

## 2012-10-31 DIAGNOSIS — I10 Essential (primary) hypertension: Secondary | ICD-10-CM

## 2012-10-31 DIAGNOSIS — G8190 Hemiplegia, unspecified affecting unspecified side: Secondary | ICD-10-CM | POA: Diagnosis not present

## 2012-10-31 DIAGNOSIS — I69959 Hemiplegia and hemiparesis following unspecified cerebrovascular disease affecting unspecified side: Secondary | ICD-10-CM | POA: Diagnosis not present

## 2012-10-31 DIAGNOSIS — J029 Acute pharyngitis, unspecified: Secondary | ICD-10-CM | POA: Diagnosis not present

## 2012-10-31 DIAGNOSIS — R609 Edema, unspecified: Secondary | ICD-10-CM | POA: Diagnosis not present

## 2012-10-31 DIAGNOSIS — M6281 Muscle weakness (generalized): Secondary | ICD-10-CM | POA: Diagnosis not present

## 2012-10-31 DIAGNOSIS — I69919 Unspecified symptoms and signs involving cognitive functions following unspecified cerebrovascular disease: Secondary | ICD-10-CM | POA: Diagnosis not present

## 2012-10-31 DIAGNOSIS — R279 Unspecified lack of coordination: Secondary | ICD-10-CM | POA: Diagnosis not present

## 2012-10-31 NOTE — Progress Notes (Signed)
Patient ID: Rachel Cooke, female   DOB: 05/22/1919, 79 y.o.   MRN: [id number] [facility] SN[facility] [phone])  Code Status: Living Will, DNR  Contact Information   Name Relation Home Work [place] Daughter [phone]  [phone]      Chief Complaint  Patient presents with  . Sinusitis  . Hypertension    HPI: This is a 79 y.o. female resident of [facility], Skilled Nursing section evaluated today due to headache and elevated blood pressure. Patient called for the nurse early this morning and told her she'd been awake all night because of a headache. On assessment, nurse noted BP elevated, 182/97 patient was given her morning blood pressure medication with improvement in BP reading, 155/72. Patient also complained of itchy watery eyes. She was given Tylenol for the headache, as well as cetirizine for allergy symptoms. The headache was improved for a while but has returned now, patient also now complains of a sore throat. Now says that it was the sore throat that kept her up most of the night. Until today patient is feeling very well, celebrating her 22st birthday with family and friends 2 days ago. Review of record shows patient's blood pressure has been mildly elevated over the last month.    Allergies  Allergen Reactions  . Lactose Intolerance (Gi)    Medications Reviewed  DATA REVIEWED:  Laboratory Studies:   [facility], external  08/21/2012: Glucose 101, BUN 5, creatinine 0.46, sodium 139, potassium 3.6. LFTs WNL. Albumin 3.0 GFR 88  TSH 2.48  09/06/2012 urinalysis. Large blood, 30 protein, positive nitrate, large leukocyte esterase, many bacteria  09/10/2102: WBC 5.2, Hgb 11.9, Hct 36.3, plt 199   Glu 121, BUN 9, Cr. .46, Na 141, K+ 3.7 Albumin 3.4  TC 162, TG 242, HDL 31, LDL 94  A1C 6.0  Review of Systems  DATA OBTAINED: from patient, nurse, daughter, medical record,  GENERAL: Doesn't feel well todayl   No fevers, Is  fatigued, no appetite  SKIN: No itch, rash or open wounds EYES: No eye pain,  No change in vision Mild photophobia present EARS: No earache, change in hearing NOSE: Mild nasal congestion, No drainage or bleeding MOUTH/THROAT: No mouth or tooth pain  Sore throat present  RESPIRATORY: No cough, wheezing, SOB CARDIAC: No chest pain, palpitations  Moderate swelling rt.arm. GI: No abdominal pain  No N/V/D or constipation  No heartburn or reflux  GU: No dysuria, frequency or urgency  No change in urine volume or character  MUSCULOSKELETAL: No joint pain, swelling or stiffness  No back pain  No muscle ache, pain,  Weakness rt arm/ leg NEUROLOGIC: No dizziness, fainting, headache,  No change in mental status.  Rt. hemiparesis PSYCHIATRIC: No feelings of anxiety, depression Sleeps well.  No behavior issue.   Physical Exam Filed Vitals:   10/31/12 1603  BP: 160/80  Pulse: 82  SpO2: 97%   There is no weight on file to calculate BMI.  GENERAL APPEARANCE: No acute distress, appropriately groomed, Overweight body habitus. Alert, pleasant, conversant. HEAD: Normocephalic, atraumatic EYES: Conjunctiva/lids clear. Pupils round, reactive.  mild photophobia EARS: Decreased Hearing  (not new) NOSE: No deformity or discharge. MOUTH/THROAT: Lips w/o lesions. Oral mucosa, tongue moist, w/o lesion. Oropharynx w/o redness or lesions.   tender submandibular lymph nodes bilateral  NECK: Supple, full ROM. No thyroid tenderness, enlargement or nodule LYMPHATICS: No  supraclavicular adenopathy RESPIRATORY: Breathing is even, unlabored. Lung sounds are clear and full.  CARDIOVASCULAR: Heart RRR.  No murmur or extra heart sounds  EDEMA: Trace bilateral ankle edema GASTROINTESTINAL: Abdomen is soft, non-tender, not distended w/ normal bowel sounds.  GENITOURINARY: Bladder non tender, not distended. MUSCULOSKELETAL:  Back with moderate kyphosis, No scoliosis or spinal process tenderness.   NEUROLOGIC: Oriented to  time, place, person. Speech clear, no tremor.  Rt. Arm with mod. edema, stronger (though not baseline) hand grip, more coordinarted movement of hand/ arm. Rt. Leg weakness evident, improved ROM PSYCHIATRIC: Mood and affect appropriate to situation  ASSESSMENT/PLAN  No problem-specific assessment & plan notes found for this encounter.  Follow up: Routine or as needed  Jovi Zavadil T.Mohsen Odenthal, NP-C 10/31/2012

## 2012-10-31 NOTE — Assessment & Plan Note (Signed)
Patient has history of sinus problems with headache nasal congestion mild sore throat and eye symptoms. She is taking cetirizine in the past with decent relief of symptoms. Headache and sore throat are most prominent today, will schedule Tylenol as well as low dose for 3 days of decongestant to help reduce sinus pain and pressure. Recommend symptomatic support of pharyngitis with Tylenol and hot tea with lemon. Continue daily antihistamine, if no significant improvement will add Singulair.

## 2012-10-31 NOTE — Assessment & Plan Note (Signed)
Blood pressure and pulse mildly elevated today, review of record shows this patient was taking Toprol-XL 100 mg daily prior to her CVA. Will increase current dose from 25-50 mg daily last nursing staff document daily blood pressures for the next 2 weeks to be sure this doesn't  push her blood pressure too low

## 2012-11-02 DIAGNOSIS — R4182 Altered mental status, unspecified: Secondary | ICD-10-CM | POA: Diagnosis not present

## 2012-11-02 DIAGNOSIS — N390 Urinary tract infection, site not specified: Secondary | ICD-10-CM | POA: Diagnosis not present

## 2012-11-03 DIAGNOSIS — I69919 Unspecified symptoms and signs involving cognitive functions following unspecified cerebrovascular disease: Secondary | ICD-10-CM | POA: Diagnosis not present

## 2012-11-03 DIAGNOSIS — R279 Unspecified lack of coordination: Secondary | ICD-10-CM | POA: Diagnosis not present

## 2012-11-03 DIAGNOSIS — I69993 Ataxia following unspecified cerebrovascular disease: Secondary | ICD-10-CM | POA: Diagnosis not present

## 2012-11-03 DIAGNOSIS — R609 Edema, unspecified: Secondary | ICD-10-CM | POA: Diagnosis not present

## 2012-11-03 DIAGNOSIS — R269 Unspecified abnormalities of gait and mobility: Secondary | ICD-10-CM | POA: Diagnosis not present

## 2012-11-03 DIAGNOSIS — G8190 Hemiplegia, unspecified affecting unspecified side: Secondary | ICD-10-CM | POA: Diagnosis not present

## 2012-11-03 DIAGNOSIS — M6281 Muscle weakness (generalized): Secondary | ICD-10-CM | POA: Diagnosis not present

## 2012-11-03 DIAGNOSIS — I69959 Hemiplegia and hemiparesis following unspecified cerebrovascular disease affecting unspecified side: Secondary | ICD-10-CM | POA: Diagnosis not present

## 2012-11-03 DIAGNOSIS — I69991 Dysphagia following unspecified cerebrovascular disease: Secondary | ICD-10-CM | POA: Diagnosis not present

## 2012-11-03 DIAGNOSIS — R1314 Dysphagia, pharyngoesophageal phase: Secondary | ICD-10-CM | POA: Diagnosis not present

## 2012-11-04 DIAGNOSIS — I69919 Unspecified symptoms and signs involving cognitive functions following unspecified cerebrovascular disease: Secondary | ICD-10-CM | POA: Diagnosis not present

## 2012-11-04 DIAGNOSIS — G8190 Hemiplegia, unspecified affecting unspecified side: Secondary | ICD-10-CM | POA: Diagnosis not present

## 2012-11-04 DIAGNOSIS — R279 Unspecified lack of coordination: Secondary | ICD-10-CM | POA: Diagnosis not present

## 2012-11-04 DIAGNOSIS — R609 Edema, unspecified: Secondary | ICD-10-CM | POA: Diagnosis not present

## 2012-11-04 DIAGNOSIS — I69959 Hemiplegia and hemiparesis following unspecified cerebrovascular disease affecting unspecified side: Secondary | ICD-10-CM | POA: Diagnosis not present

## 2012-11-04 DIAGNOSIS — M6281 Muscle weakness (generalized): Secondary | ICD-10-CM | POA: Diagnosis not present

## 2012-11-05 DIAGNOSIS — M6281 Muscle weakness (generalized): Secondary | ICD-10-CM | POA: Diagnosis not present

## 2012-11-05 DIAGNOSIS — I69919 Unspecified symptoms and signs involving cognitive functions following unspecified cerebrovascular disease: Secondary | ICD-10-CM | POA: Diagnosis not present

## 2012-11-05 DIAGNOSIS — R279 Unspecified lack of coordination: Secondary | ICD-10-CM | POA: Diagnosis not present

## 2012-11-05 DIAGNOSIS — G8190 Hemiplegia, unspecified affecting unspecified side: Secondary | ICD-10-CM | POA: Diagnosis not present

## 2012-11-05 DIAGNOSIS — R609 Edema, unspecified: Secondary | ICD-10-CM | POA: Diagnosis not present

## 2012-11-05 DIAGNOSIS — I69959 Hemiplegia and hemiparesis following unspecified cerebrovascular disease affecting unspecified side: Secondary | ICD-10-CM | POA: Diagnosis not present

## 2012-11-06 DIAGNOSIS — M6281 Muscle weakness (generalized): Secondary | ICD-10-CM | POA: Diagnosis not present

## 2012-11-06 DIAGNOSIS — I69919 Unspecified symptoms and signs involving cognitive functions following unspecified cerebrovascular disease: Secondary | ICD-10-CM | POA: Diagnosis not present

## 2012-11-06 DIAGNOSIS — G8190 Hemiplegia, unspecified affecting unspecified side: Secondary | ICD-10-CM | POA: Diagnosis not present

## 2012-11-06 DIAGNOSIS — R609 Edema, unspecified: Secondary | ICD-10-CM | POA: Diagnosis not present

## 2012-11-06 DIAGNOSIS — R279 Unspecified lack of coordination: Secondary | ICD-10-CM | POA: Diagnosis not present

## 2012-11-06 DIAGNOSIS — I69959 Hemiplegia and hemiparesis following unspecified cerebrovascular disease affecting unspecified side: Secondary | ICD-10-CM | POA: Diagnosis not present

## 2012-11-07 DIAGNOSIS — R609 Edema, unspecified: Secondary | ICD-10-CM | POA: Diagnosis not present

## 2012-11-07 DIAGNOSIS — I69959 Hemiplegia and hemiparesis following unspecified cerebrovascular disease affecting unspecified side: Secondary | ICD-10-CM | POA: Diagnosis not present

## 2012-11-07 DIAGNOSIS — G8190 Hemiplegia, unspecified affecting unspecified side: Secondary | ICD-10-CM | POA: Diagnosis not present

## 2012-11-07 DIAGNOSIS — M6281 Muscle weakness (generalized): Secondary | ICD-10-CM | POA: Diagnosis not present

## 2012-11-07 DIAGNOSIS — R279 Unspecified lack of coordination: Secondary | ICD-10-CM | POA: Diagnosis not present

## 2012-11-07 DIAGNOSIS — I69919 Unspecified symptoms and signs involving cognitive functions following unspecified cerebrovascular disease: Secondary | ICD-10-CM | POA: Diagnosis not present

## 2012-11-10 DIAGNOSIS — M6281 Muscle weakness (generalized): Secondary | ICD-10-CM | POA: Diagnosis not present

## 2012-11-10 DIAGNOSIS — R279 Unspecified lack of coordination: Secondary | ICD-10-CM | POA: Diagnosis not present

## 2012-11-10 DIAGNOSIS — I69959 Hemiplegia and hemiparesis following unspecified cerebrovascular disease affecting unspecified side: Secondary | ICD-10-CM | POA: Diagnosis not present

## 2012-11-10 DIAGNOSIS — I69919 Unspecified symptoms and signs involving cognitive functions following unspecified cerebrovascular disease: Secondary | ICD-10-CM | POA: Diagnosis not present

## 2012-11-10 DIAGNOSIS — R609 Edema, unspecified: Secondary | ICD-10-CM | POA: Diagnosis not present

## 2012-11-10 DIAGNOSIS — G8190 Hemiplegia, unspecified affecting unspecified side: Secondary | ICD-10-CM | POA: Diagnosis not present

## 2012-11-11 DIAGNOSIS — I69959 Hemiplegia and hemiparesis following unspecified cerebrovascular disease affecting unspecified side: Secondary | ICD-10-CM | POA: Diagnosis not present

## 2012-11-11 DIAGNOSIS — R279 Unspecified lack of coordination: Secondary | ICD-10-CM | POA: Diagnosis not present

## 2012-11-11 DIAGNOSIS — I69919 Unspecified symptoms and signs involving cognitive functions following unspecified cerebrovascular disease: Secondary | ICD-10-CM | POA: Diagnosis not present

## 2012-11-11 DIAGNOSIS — M6281 Muscle weakness (generalized): Secondary | ICD-10-CM | POA: Diagnosis not present

## 2012-11-11 DIAGNOSIS — R609 Edema, unspecified: Secondary | ICD-10-CM | POA: Diagnosis not present

## 2012-11-11 DIAGNOSIS — G8190 Hemiplegia, unspecified affecting unspecified side: Secondary | ICD-10-CM | POA: Diagnosis not present

## 2012-11-12 DIAGNOSIS — R279 Unspecified lack of coordination: Secondary | ICD-10-CM | POA: Diagnosis not present

## 2012-11-12 DIAGNOSIS — I69919 Unspecified symptoms and signs involving cognitive functions following unspecified cerebrovascular disease: Secondary | ICD-10-CM | POA: Diagnosis not present

## 2012-11-12 DIAGNOSIS — M6281 Muscle weakness (generalized): Secondary | ICD-10-CM | POA: Diagnosis not present

## 2012-11-12 DIAGNOSIS — I69959 Hemiplegia and hemiparesis following unspecified cerebrovascular disease affecting unspecified side: Secondary | ICD-10-CM | POA: Diagnosis not present

## 2012-11-12 DIAGNOSIS — G8190 Hemiplegia, unspecified affecting unspecified side: Secondary | ICD-10-CM | POA: Diagnosis not present

## 2012-11-12 DIAGNOSIS — R609 Edema, unspecified: Secondary | ICD-10-CM | POA: Diagnosis not present

## 2012-11-13 DIAGNOSIS — M6281 Muscle weakness (generalized): Secondary | ICD-10-CM | POA: Diagnosis not present

## 2012-11-13 DIAGNOSIS — G8190 Hemiplegia, unspecified affecting unspecified side: Secondary | ICD-10-CM | POA: Diagnosis not present

## 2012-11-13 DIAGNOSIS — R609 Edema, unspecified: Secondary | ICD-10-CM | POA: Diagnosis not present

## 2012-11-13 DIAGNOSIS — R279 Unspecified lack of coordination: Secondary | ICD-10-CM | POA: Diagnosis not present

## 2012-11-13 DIAGNOSIS — I69919 Unspecified symptoms and signs involving cognitive functions following unspecified cerebrovascular disease: Secondary | ICD-10-CM | POA: Diagnosis not present

## 2012-11-13 DIAGNOSIS — I69959 Hemiplegia and hemiparesis following unspecified cerebrovascular disease affecting unspecified side: Secondary | ICD-10-CM | POA: Diagnosis not present

## 2012-11-14 DIAGNOSIS — R609 Edema, unspecified: Secondary | ICD-10-CM | POA: Diagnosis not present

## 2012-11-14 DIAGNOSIS — M6281 Muscle weakness (generalized): Secondary | ICD-10-CM | POA: Diagnosis not present

## 2012-11-14 DIAGNOSIS — I69919 Unspecified symptoms and signs involving cognitive functions following unspecified cerebrovascular disease: Secondary | ICD-10-CM | POA: Diagnosis not present

## 2012-11-14 DIAGNOSIS — G8190 Hemiplegia, unspecified affecting unspecified side: Secondary | ICD-10-CM | POA: Diagnosis not present

## 2012-11-14 DIAGNOSIS — I69959 Hemiplegia and hemiparesis following unspecified cerebrovascular disease affecting unspecified side: Secondary | ICD-10-CM | POA: Diagnosis not present

## 2012-11-14 DIAGNOSIS — R279 Unspecified lack of coordination: Secondary | ICD-10-CM | POA: Diagnosis not present

## 2012-11-16 DIAGNOSIS — I69959 Hemiplegia and hemiparesis following unspecified cerebrovascular disease affecting unspecified side: Secondary | ICD-10-CM | POA: Diagnosis not present

## 2012-11-16 DIAGNOSIS — I69919 Unspecified symptoms and signs involving cognitive functions following unspecified cerebrovascular disease: Secondary | ICD-10-CM | POA: Diagnosis not present

## 2012-11-16 DIAGNOSIS — R279 Unspecified lack of coordination: Secondary | ICD-10-CM | POA: Diagnosis not present

## 2012-11-16 DIAGNOSIS — R609 Edema, unspecified: Secondary | ICD-10-CM | POA: Diagnosis not present

## 2012-11-16 DIAGNOSIS — G8190 Hemiplegia, unspecified affecting unspecified side: Secondary | ICD-10-CM | POA: Diagnosis not present

## 2012-11-16 DIAGNOSIS — M6281 Muscle weakness (generalized): Secondary | ICD-10-CM | POA: Diagnosis not present

## 2012-11-17 DIAGNOSIS — R609 Edema, unspecified: Secondary | ICD-10-CM | POA: Diagnosis not present

## 2012-11-17 DIAGNOSIS — M6281 Muscle weakness (generalized): Secondary | ICD-10-CM | POA: Diagnosis not present

## 2012-11-17 DIAGNOSIS — I69919 Unspecified symptoms and signs involving cognitive functions following unspecified cerebrovascular disease: Secondary | ICD-10-CM | POA: Diagnosis not present

## 2012-11-17 DIAGNOSIS — G8190 Hemiplegia, unspecified affecting unspecified side: Secondary | ICD-10-CM | POA: Diagnosis not present

## 2012-11-17 DIAGNOSIS — R279 Unspecified lack of coordination: Secondary | ICD-10-CM | POA: Diagnosis not present

## 2012-11-17 DIAGNOSIS — I69959 Hemiplegia and hemiparesis following unspecified cerebrovascular disease affecting unspecified side: Secondary | ICD-10-CM | POA: Diagnosis not present

## 2012-11-18 DIAGNOSIS — G8190 Hemiplegia, unspecified affecting unspecified side: Secondary | ICD-10-CM | POA: Diagnosis not present

## 2012-11-18 DIAGNOSIS — I69959 Hemiplegia and hemiparesis following unspecified cerebrovascular disease affecting unspecified side: Secondary | ICD-10-CM | POA: Diagnosis not present

## 2012-11-18 DIAGNOSIS — R279 Unspecified lack of coordination: Secondary | ICD-10-CM | POA: Diagnosis not present

## 2012-11-18 DIAGNOSIS — I69919 Unspecified symptoms and signs involving cognitive functions following unspecified cerebrovascular disease: Secondary | ICD-10-CM | POA: Diagnosis not present

## 2012-11-18 DIAGNOSIS — R609 Edema, unspecified: Secondary | ICD-10-CM | POA: Diagnosis not present

## 2012-11-18 DIAGNOSIS — M6281 Muscle weakness (generalized): Secondary | ICD-10-CM | POA: Diagnosis not present

## 2012-11-19 DIAGNOSIS — G8190 Hemiplegia, unspecified affecting unspecified side: Secondary | ICD-10-CM | POA: Diagnosis not present

## 2012-11-19 DIAGNOSIS — R279 Unspecified lack of coordination: Secondary | ICD-10-CM | POA: Diagnosis not present

## 2012-11-19 DIAGNOSIS — I69959 Hemiplegia and hemiparesis following unspecified cerebrovascular disease affecting unspecified side: Secondary | ICD-10-CM | POA: Diagnosis not present

## 2012-11-19 DIAGNOSIS — I69919 Unspecified symptoms and signs involving cognitive functions following unspecified cerebrovascular disease: Secondary | ICD-10-CM | POA: Diagnosis not present

## 2012-11-19 DIAGNOSIS — R609 Edema, unspecified: Secondary | ICD-10-CM | POA: Diagnosis not present

## 2012-11-19 DIAGNOSIS — M6281 Muscle weakness (generalized): Secondary | ICD-10-CM | POA: Diagnosis not present

## 2012-11-20 DIAGNOSIS — I69959 Hemiplegia and hemiparesis following unspecified cerebrovascular disease affecting unspecified side: Secondary | ICD-10-CM | POA: Diagnosis not present

## 2012-11-20 DIAGNOSIS — R279 Unspecified lack of coordination: Secondary | ICD-10-CM | POA: Diagnosis not present

## 2012-11-20 DIAGNOSIS — G8190 Hemiplegia, unspecified affecting unspecified side: Secondary | ICD-10-CM | POA: Diagnosis not present

## 2012-11-20 DIAGNOSIS — R609 Edema, unspecified: Secondary | ICD-10-CM | POA: Diagnosis not present

## 2012-11-20 DIAGNOSIS — I69919 Unspecified symptoms and signs involving cognitive functions following unspecified cerebrovascular disease: Secondary | ICD-10-CM | POA: Diagnosis not present

## 2012-11-20 DIAGNOSIS — M6281 Muscle weakness (generalized): Secondary | ICD-10-CM | POA: Diagnosis not present

## 2012-11-21 DIAGNOSIS — M6281 Muscle weakness (generalized): Secondary | ICD-10-CM | POA: Diagnosis not present

## 2012-11-21 DIAGNOSIS — I69959 Hemiplegia and hemiparesis following unspecified cerebrovascular disease affecting unspecified side: Secondary | ICD-10-CM | POA: Diagnosis not present

## 2012-11-21 DIAGNOSIS — R279 Unspecified lack of coordination: Secondary | ICD-10-CM | POA: Diagnosis not present

## 2012-11-21 DIAGNOSIS — I69919 Unspecified symptoms and signs involving cognitive functions following unspecified cerebrovascular disease: Secondary | ICD-10-CM | POA: Diagnosis not present

## 2012-11-21 DIAGNOSIS — G8190 Hemiplegia, unspecified affecting unspecified side: Secondary | ICD-10-CM | POA: Diagnosis not present

## 2012-11-21 DIAGNOSIS — R609 Edema, unspecified: Secondary | ICD-10-CM | POA: Diagnosis not present

## 2012-11-23 DIAGNOSIS — I69919 Unspecified symptoms and signs involving cognitive functions following unspecified cerebrovascular disease: Secondary | ICD-10-CM | POA: Diagnosis not present

## 2012-11-23 DIAGNOSIS — I69959 Hemiplegia and hemiparesis following unspecified cerebrovascular disease affecting unspecified side: Secondary | ICD-10-CM | POA: Diagnosis not present

## 2012-11-23 DIAGNOSIS — M6281 Muscle weakness (generalized): Secondary | ICD-10-CM | POA: Diagnosis not present

## 2012-11-23 DIAGNOSIS — R279 Unspecified lack of coordination: Secondary | ICD-10-CM | POA: Diagnosis not present

## 2012-11-23 DIAGNOSIS — R609 Edema, unspecified: Secondary | ICD-10-CM | POA: Diagnosis not present

## 2012-11-23 DIAGNOSIS — G8190 Hemiplegia, unspecified affecting unspecified side: Secondary | ICD-10-CM | POA: Diagnosis not present

## 2012-11-24 ENCOUNTER — Non-Acute Institutional Stay (SKILLED_NURSING_FACILITY): Payer: Medicare Other | Admitting: Geriatric Medicine

## 2012-11-24 ENCOUNTER — Encounter: Payer: Self-pay | Admitting: Geriatric Medicine

## 2012-11-24 DIAGNOSIS — J019 Acute sinusitis, unspecified: Secondary | ICD-10-CM

## 2012-11-24 DIAGNOSIS — R609 Edema, unspecified: Secondary | ICD-10-CM | POA: Diagnosis not present

## 2012-11-24 DIAGNOSIS — G8190 Hemiplegia, unspecified affecting unspecified side: Secondary | ICD-10-CM | POA: Diagnosis not present

## 2012-11-24 DIAGNOSIS — I69959 Hemiplegia and hemiparesis following unspecified cerebrovascular disease affecting unspecified side: Secondary | ICD-10-CM | POA: Diagnosis not present

## 2012-11-24 DIAGNOSIS — I69919 Unspecified symptoms and signs involving cognitive functions following unspecified cerebrovascular disease: Secondary | ICD-10-CM | POA: Diagnosis not present

## 2012-11-24 DIAGNOSIS — M6281 Muscle weakness (generalized): Secondary | ICD-10-CM | POA: Diagnosis not present

## 2012-11-24 DIAGNOSIS — M1990 Unspecified osteoarthritis, unspecified site: Secondary | ICD-10-CM | POA: Diagnosis not present

## 2012-11-24 DIAGNOSIS — R279 Unspecified lack of coordination: Secondary | ICD-10-CM | POA: Diagnosis not present

## 2012-11-24 NOTE — Progress Notes (Signed)
Patient ID: ZONA PEDRO, female   DOB: 1919-10-07, 79 y.o.   MRN: [id number] [facility] [phone])  Code Status: Living Will, DNR Contact Information   Name Relation Home Work [place] Daughter [phone]  [phone]      Chief Complaint  Patient presents with  . Arm Swelling    HPI: This is a 79 y.o. female resident of [facility], Skilled Nursing section.  Evaluation is requested today due to right arm edema. Patient has had pain and swelling in her right arm since CVA April 2014. She has been treated with compression however this has increased the pain. Have tapered down on the duration of compression, she continues to have pain in her arm. Patient also developed some pain in her right shoulder, this has been treated with a topical NSAID with some relief.   Allergies  Allergen Reactions  . Lactose Intolerance (Gi)    Medications reviewed  DATA REVIEWED  Laboratory Studies  [facility], external  08/21/2012: Glucose 101, BUN 5, creatinine 0.46, sodium 139, potassium 3.6. LFTs WNL. Albumin 3.0 GFR 88  TSH 2.48  09/06/2012 urinalysis. Large blood, 30 protein, positive nitrate, large leukocyte esterase, many bacteria  09/10/2102: WBC 5.2, Hgb 11.9, Hct 36.3, plt 199  Glu 121, BUN 9, Cr. .46, Na 141, K+ 3.7 Albumin 3.4  TC 162, TG 242, HDL 31, LDL 94  A1C 6.0     Review of Systems  DATA OBTAINED: from patient, nurse, medical record,  GENERAL: Doesn't feel well today, has had a headache for several days. No fevers, appetite satisfactory SKIN: No itch, rash or open wounds  EYES: No eye pain, No change in vision Mild photophobia present  EARS: No earache, change in hearing  NOSE: Mild nasal congestion, No drainage or bleeding MOUTH/THROAT: No mouth or tooth pain, No Sore throat  RESPIRATORY: No cough, wheezing, SOB  CARDIAC: No chest pain, palpitations  MUSCULOSKELETAL: No joint pain, swelling or stiffness No back pain No  muscle ache, pain, Weakness rt arm/ leg  NEUROLOGIC: No dizziness, fainting, headache, No change in mental status. Rt. hemiparesis  PSYCHIATRIC: No feelings of anxiety, depression Sleeps well.    Physical Exam Filed Vitals:   11/24/12 1143  BP: 148/71  Pulse: 71  Temp: 97.6 F (36.4 C)  Resp: 18  SpO2: 94%   GENERAL APPEARANCE: No acute distress, appropriately groomed, Overweight body habitus. Alert, pleasant, conversant.  HEAD: Normocephalic, atraumatic Tender frontal and maxillary sinuses EYES: Conjunctiva/lids clear. Pupils round, reactive. mild photophobia  EARS: Decreased Hearing (not new)  NOSE: No deformity or discharge.  MOUTH/THROAT: Lips w/o lesions. Oral mucosa, tongue moist, w/o lesion. Oropharynx w/o redness or lesions.   NECK: Supple, full ROM. No thyroid tenderness, enlargement or nodule  LYMPHATICS: No cervical or supraclavicular adenopathy  RESPIRATORY: Breathing is even, unlabored. Lung sounds are clear and full.  CARDIOVASCULAR: Heart RRR. No murmur or extra heart sounds   EDEMA:  No edema rt.arm, hand with trace edema. Trace bilateral ankle edema  MUSCULOSKELTAL:  Rt. Hand with trace edema, painful  hand grip, tender knuckles. Right shoulder joint is tender, decreased range of motion evident NEUROLOGIC: Oriented to time, place, person. Speech clear, no tremor. Rt. Leg weakness evident, improved ROM  PSYCHIATRIC: Mood and affect appropriate to situation  ASSESSMENT/PLAN  Edema Arm above wrist without any edema, will stop compression. Right hand with trace edema and tenderness across knuckles, this appears more consistent with inflammation than strictly edema, add topical NSAID.  Osteoarthrosis, unspecified whether generalized or localized, unspecified site Left shoulder tenderness decreased range of motion, topical NSAID has been helpful, increase to TID. Right hand knuckles also tender with appearance of inflammation. Will try topical NSAID her as  well.  Acute sinus infection Headache for several days, tender sinuses. Treat with po antibiotic.    Follow up: Routine or as needed  Geraldene Eisel T.Myangel Summons, NP-C 11/24/2012

## 2012-11-25 ENCOUNTER — Encounter: Payer: Self-pay | Admitting: Geriatric Medicine

## 2012-11-25 DIAGNOSIS — J019 Acute sinusitis, unspecified: Secondary | ICD-10-CM

## 2012-11-25 DIAGNOSIS — G8190 Hemiplegia, unspecified affecting unspecified side: Secondary | ICD-10-CM | POA: Diagnosis not present

## 2012-11-25 DIAGNOSIS — M1990 Unspecified osteoarthritis, unspecified site: Secondary | ICD-10-CM | POA: Insufficient documentation

## 2012-11-25 DIAGNOSIS — E1159 Type 2 diabetes mellitus with other circulatory complications: Secondary | ICD-10-CM | POA: Diagnosis not present

## 2012-11-25 DIAGNOSIS — L608 Other nail disorders: Secondary | ICD-10-CM | POA: Diagnosis not present

## 2012-11-25 DIAGNOSIS — I69959 Hemiplegia and hemiparesis following unspecified cerebrovascular disease affecting unspecified side: Secondary | ICD-10-CM | POA: Diagnosis not present

## 2012-11-25 DIAGNOSIS — M6281 Muscle weakness (generalized): Secondary | ICD-10-CM | POA: Diagnosis not present

## 2012-11-25 DIAGNOSIS — L851 Acquired keratosis [keratoderma] palmaris et plantaris: Secondary | ICD-10-CM | POA: Diagnosis not present

## 2012-11-25 DIAGNOSIS — R279 Unspecified lack of coordination: Secondary | ICD-10-CM | POA: Diagnosis not present

## 2012-11-25 DIAGNOSIS — I69919 Unspecified symptoms and signs involving cognitive functions following unspecified cerebrovascular disease: Secondary | ICD-10-CM | POA: Diagnosis not present

## 2012-11-25 DIAGNOSIS — R609 Edema, unspecified: Secondary | ICD-10-CM | POA: Diagnosis not present

## 2012-11-25 HISTORY — DX: Acute sinusitis, unspecified: J01.90

## 2012-11-25 NOTE — Assessment & Plan Note (Signed)
Arm above wrist without any edema, will stop compression. Right hand with trace edema and tenderness across knuckles, this appears more consistent with inflammation than strictly edema, add topical NSAID.

## 2012-11-25 NOTE — Assessment & Plan Note (Signed)
Headache for several days, tender sinuses. Treat with po antibiotic.

## 2012-11-25 NOTE — Assessment & Plan Note (Signed)
Left shoulder tenderness decreased range of motion, topical NSAID has been helpful, increase to TID. Right hand knuckles also tender with appearance of inflammation. Will try topical NSAID her as well.

## 2012-11-26 DIAGNOSIS — I69919 Unspecified symptoms and signs involving cognitive functions following unspecified cerebrovascular disease: Secondary | ICD-10-CM | POA: Diagnosis not present

## 2012-11-26 DIAGNOSIS — G8190 Hemiplegia, unspecified affecting unspecified side: Secondary | ICD-10-CM | POA: Diagnosis not present

## 2012-11-26 DIAGNOSIS — I69959 Hemiplegia and hemiparesis following unspecified cerebrovascular disease affecting unspecified side: Secondary | ICD-10-CM | POA: Diagnosis not present

## 2012-11-26 DIAGNOSIS — R609 Edema, unspecified: Secondary | ICD-10-CM | POA: Diagnosis not present

## 2012-11-26 DIAGNOSIS — R279 Unspecified lack of coordination: Secondary | ICD-10-CM | POA: Diagnosis not present

## 2012-11-26 DIAGNOSIS — M6281 Muscle weakness (generalized): Secondary | ICD-10-CM | POA: Diagnosis not present

## 2012-11-27 ENCOUNTER — Non-Acute Institutional Stay (SKILLED_NURSING_FACILITY): Payer: Medicare Other | Admitting: Geriatric Medicine

## 2012-11-27 DIAGNOSIS — J019 Acute sinusitis, unspecified: Secondary | ICD-10-CM | POA: Diagnosis not present

## 2012-11-27 DIAGNOSIS — R279 Unspecified lack of coordination: Secondary | ICD-10-CM | POA: Diagnosis not present

## 2012-11-27 DIAGNOSIS — R4182 Altered mental status, unspecified: Secondary | ICD-10-CM | POA: Diagnosis not present

## 2012-11-27 DIAGNOSIS — R609 Edema, unspecified: Secondary | ICD-10-CM | POA: Diagnosis not present

## 2012-11-27 DIAGNOSIS — I69919 Unspecified symptoms and signs involving cognitive functions following unspecified cerebrovascular disease: Secondary | ICD-10-CM | POA: Diagnosis not present

## 2012-11-27 DIAGNOSIS — I69959 Hemiplegia and hemiparesis following unspecified cerebrovascular disease affecting unspecified side: Secondary | ICD-10-CM | POA: Diagnosis not present

## 2012-11-27 DIAGNOSIS — M6281 Muscle weakness (generalized): Secondary | ICD-10-CM | POA: Diagnosis not present

## 2012-11-27 DIAGNOSIS — G8190 Hemiplegia, unspecified affecting unspecified side: Secondary | ICD-10-CM | POA: Diagnosis not present

## 2012-11-27 NOTE — Progress Notes (Signed)
Patient ID: Rachel Cooke, female   DOB: Jul 28, 1919, 79 y.o.   MRN: [id number] [facility] [phone])  Code Status: DNR Contact Information   Name Relation Home Work [place] Daughter [phone]  [phone]      Chief Complaint  Patient presents with  . Altered Mental Status  . Sinusitis    HPI: This is a 79 y.o. female resident of [facility], Skilled Nursing  section.  Evaluation is requested today due to change in mental status since last visit. On the evening of September 23 patient was noted to be altered from her baseline mental status with significant confusion and combative behavior. Patient had been started on amoxicillin the day prior, no other medication or treatment changes. Patient's vital signs were stable,  no change in her p.o. intake. Urine and stool output was as usual. Patient's confusion continued into the next day, amoxicillin was stopped after 4 doses. Today, patient is noted to be oriented, and back to her usual self per nursing report. Information from Occupational Therapy reveals the patient has had increased lethargy and decreased interactions noted over the last week to 10 days. Earlier this month patient was treated for a Klebsiella urinary tract infection with a seven-day course of Cipro.    Allergies  Allergen Reactions  . Lactose Intolerance (Gi)    Medications Reviewed  DATA REVIEWED  Radiologic Exams:   Cardiovascular Exams:   Laboratory Studies  [facility], external  08/21/2012: Glucose 101, BUN 5, creatinine 0.46, sodium 139, potassium 3.6. LFTs WNL. Albumin  3.0 GFR 88   TSH 2.48  09/06/2012 urinalysis. Large blood, 30 protein, positive nitrate, large leukocyte esterase, many bacteria  09/10/2102: WBC 5.2, Hgb 11.9, Hct 36.3, plt 199   Glu 121, BUN 9, Cr. .46, Na 141, K+ 3.7 Albumin 3.4   TC 162, TG 242, HDL 31, LDL 94   A1C 6.0 11/05/2012 urine culture:greater than 100,000 Klebsiella  pneumoniae   Review of Systems  DATA OBTAINED: from patient, nurse, medical record,   GENERAL: Feels OK Headache is less prominent. No fevers, appetite satisfactory SKIN: No itch, rash or open wounds   EYES: No eye pain, No change in vision Denies photophobia todaypresent  EARS: No earache, change in hearing   NOSE: Mild nasal congestion, No drainage or bleeding  MOUTH/THROAT: No mouth or tooth pain, No Sore throat  RESPIRATORY: No cough, wheezing, SOB   CARDIAC: No chest pain, palpitations   MUSCULOSKELETAL: No joint pain, swelling or stiffness No back pain No muscle ache, pain, Weakness rt arm/ leg   NEUROLOGIC: No dizziness, fainting, headache, Recent change in mental status- see HPI. Rt. hemiparesis   PSYCHIATRIC: No feelings of anxiety, depression Sleeps well.   Physical Exam Filed Vitals:   11/28/12 1038  BP: 111/58  Pulse: 72  Temp: 97.6 F (36.4 C)  Resp: 20  Weight: 161 lb 3.2 oz (73.12 kg)  SpO2: 94%   Body mass index is 26.03 kg/(m^2). GENERAL APPEARANCE: No acute distress, appropriately groomed, Overweight body habitus. Alert, pleasant, conversant.  HEAD: Normocephalic, atraumatic  frontal and maxillary sinuses with minimal tenderness today EYES: Conjunctiva/lids clear. Pupils round, reactive. mild photophobia   EARS: Decreased Hearing (not new)   NOSE: No deformity or discharge.   MOUTH/THROAT: Lips w/o lesions. Oral mucosa, tongue moist, w/o lesion. Oropharynx w/o redness or lesions.   NECK: Supple, full ROM. No thyroid tenderness, enlargement or nodule   LYMPHATICS: No cervical or supraclavicular adenopathy   RESPIRATORY:  Breathing is even, unlabored. Lung sounds are clear and full.   CARDIOVASCULAR: Heart RRR. No murmur or extra heart sounds               EDEMA:  No edema rt.arm, hand with trace edema. Trace bilateral ankle edema   MUSCULOSKELTAL:  Rt. Hand with trace edema, less painful  hand grip, less tender knuckles. Right shoulder joint is tender,  decreased range of motion evident NEUROLOGIC: Oriented to time, person- some difficulty with place (city). Difficulty witn word finding, processing is slower than usual. Speech clear, no tremor.  Rt. Leg weakness evident, improved ROM  PSYCHIATRIC: Mood and affect appropriate to situation  ASSESSMENT/PLAN  Altered mental status Increased confusion and very unusual combative behavior for this patient 2 days ago. Most of the abnormal behavior has cleared though she does appear to be just slightly below her usual sharp mental state. Unclear exact cause of this change though doubtful that amoxicillin was actual cause. She does not have any significant symptoms of sinusitis, will not restart antibiotic at this time. Asked for  Dr. Thomasene Lot input, he agrees amoxicillin likely not the main culprit. Possible etiologies include urinary tract infection, electrolyte imbalance, recurrent CVA. Recommend lab and repeat urine culture. Medication list reviewed, will DC cetirizine due to recent concern for lethargy. Discussed today's findings and plan with the patient's daughter, [name] via telephone today - she is very concerned about these mental status changes and agrees with plan.  Acute sinus infection Amoxicillin started for symptoms of acute sinusitis, patient received 4 doses. Medication was stopped due to acute mental status changes. Doubtful this medication was the cause of mental status changes however will not restart antibiotic at this time as patient's symptoms have receded.   Time: 40 minutes, >50% spent counseling/ care coordination Follow up: As needed  Lab: CBC, CMP, Cath U/A C&S  Lionel Woodberry T.Callia Swim, NP-C 11/27/2012

## 2012-11-28 ENCOUNTER — Encounter: Payer: Self-pay | Admitting: Geriatric Medicine

## 2012-11-28 DIAGNOSIS — R609 Edema, unspecified: Secondary | ICD-10-CM | POA: Diagnosis not present

## 2012-11-28 DIAGNOSIS — M6281 Muscle weakness (generalized): Secondary | ICD-10-CM | POA: Diagnosis not present

## 2012-11-28 DIAGNOSIS — R4182 Altered mental status, unspecified: Secondary | ICD-10-CM | POA: Diagnosis not present

## 2012-11-28 DIAGNOSIS — I69919 Unspecified symptoms and signs involving cognitive functions following unspecified cerebrovascular disease: Secondary | ICD-10-CM | POA: Diagnosis not present

## 2012-11-28 DIAGNOSIS — I69959 Hemiplegia and hemiparesis following unspecified cerebrovascular disease affecting unspecified side: Secondary | ICD-10-CM | POA: Diagnosis not present

## 2012-11-28 DIAGNOSIS — G8190 Hemiplegia, unspecified affecting unspecified side: Secondary | ICD-10-CM | POA: Diagnosis not present

## 2012-11-28 DIAGNOSIS — R279 Unspecified lack of coordination: Secondary | ICD-10-CM | POA: Diagnosis not present

## 2012-11-28 HISTORY — DX: Altered mental status, unspecified: R41.82

## 2012-11-28 NOTE — Assessment & Plan Note (Addendum)
Increased confusion and very unusual combative behavior for this patient 2 days ago. Most of the abnormal behavior has cleared though she does appear to be just slightly below her usual sharp mental state. Unclear exact cause of this change though doubtful that amoxicillin was actual cause. She does not have any significant symptoms of sinusitis, will not restart antibiotic at this time. Asked for  Dr. Thomasene Lot input, he agrees amoxicillin likely not the main culprit. Possible etiologies include urinary tract infection, electrolyte imbalance, recurrent CVA. Recommend lab and repeat urine culture. Medication list reviewed, will DC cetirizine due to recent concern for lethargy. Discussed today's findings and plan with the patient's daughter, [name] via telephone today - she is very concerned about these mental status changes and agrees with plan.

## 2012-11-28 NOTE — Assessment & Plan Note (Signed)
Amoxicillin started for symptoms of acute sinusitis, patient received 4 doses. Medication was stopped due to acute mental status changes. Doubtful this medication was the cause of mental status changes however will not restart antibiotic at this time as patient's symptoms have receded.

## 2012-11-29 DIAGNOSIS — R279 Unspecified lack of coordination: Secondary | ICD-10-CM | POA: Diagnosis not present

## 2012-11-29 DIAGNOSIS — M6281 Muscle weakness (generalized): Secondary | ICD-10-CM | POA: Diagnosis not present

## 2012-11-29 DIAGNOSIS — G8190 Hemiplegia, unspecified affecting unspecified side: Secondary | ICD-10-CM | POA: Diagnosis not present

## 2012-11-29 DIAGNOSIS — I69919 Unspecified symptoms and signs involving cognitive functions following unspecified cerebrovascular disease: Secondary | ICD-10-CM | POA: Diagnosis not present

## 2012-11-29 DIAGNOSIS — I69959 Hemiplegia and hemiparesis following unspecified cerebrovascular disease affecting unspecified side: Secondary | ICD-10-CM | POA: Diagnosis not present

## 2012-11-29 DIAGNOSIS — R609 Edema, unspecified: Secondary | ICD-10-CM | POA: Diagnosis not present

## 2012-11-30 ENCOUNTER — Encounter: Payer: Self-pay | Admitting: Interventional Cardiology

## 2012-12-01 DIAGNOSIS — R279 Unspecified lack of coordination: Secondary | ICD-10-CM | POA: Diagnosis not present

## 2012-12-01 DIAGNOSIS — M6281 Muscle weakness (generalized): Secondary | ICD-10-CM | POA: Diagnosis not present

## 2012-12-01 DIAGNOSIS — G8190 Hemiplegia, unspecified affecting unspecified side: Secondary | ICD-10-CM | POA: Diagnosis not present

## 2012-12-01 DIAGNOSIS — R609 Edema, unspecified: Secondary | ICD-10-CM | POA: Diagnosis not present

## 2012-12-01 DIAGNOSIS — I69959 Hemiplegia and hemiparesis following unspecified cerebrovascular disease affecting unspecified side: Secondary | ICD-10-CM | POA: Diagnosis not present

## 2012-12-01 DIAGNOSIS — I69919 Unspecified symptoms and signs involving cognitive functions following unspecified cerebrovascular disease: Secondary | ICD-10-CM | POA: Diagnosis not present

## 2012-12-02 DIAGNOSIS — I69919 Unspecified symptoms and signs involving cognitive functions following unspecified cerebrovascular disease: Secondary | ICD-10-CM | POA: Diagnosis not present

## 2012-12-02 DIAGNOSIS — R609 Edema, unspecified: Secondary | ICD-10-CM | POA: Diagnosis not present

## 2012-12-02 DIAGNOSIS — I69959 Hemiplegia and hemiparesis following unspecified cerebrovascular disease affecting unspecified side: Secondary | ICD-10-CM | POA: Diagnosis not present

## 2012-12-02 DIAGNOSIS — G8190 Hemiplegia, unspecified affecting unspecified side: Secondary | ICD-10-CM | POA: Diagnosis not present

## 2012-12-02 DIAGNOSIS — M6281 Muscle weakness (generalized): Secondary | ICD-10-CM | POA: Diagnosis not present

## 2012-12-02 DIAGNOSIS — R279 Unspecified lack of coordination: Secondary | ICD-10-CM | POA: Diagnosis not present

## 2012-12-03 DIAGNOSIS — R609 Edema, unspecified: Secondary | ICD-10-CM | POA: Diagnosis not present

## 2012-12-03 DIAGNOSIS — R279 Unspecified lack of coordination: Secondary | ICD-10-CM | POA: Diagnosis not present

## 2012-12-03 DIAGNOSIS — I69993 Ataxia following unspecified cerebrovascular disease: Secondary | ICD-10-CM | POA: Diagnosis not present

## 2012-12-03 DIAGNOSIS — I69991 Dysphagia following unspecified cerebrovascular disease: Secondary | ICD-10-CM | POA: Diagnosis not present

## 2012-12-03 DIAGNOSIS — R269 Unspecified abnormalities of gait and mobility: Secondary | ICD-10-CM | POA: Diagnosis not present

## 2012-12-03 DIAGNOSIS — M6281 Muscle weakness (generalized): Secondary | ICD-10-CM | POA: Diagnosis not present

## 2012-12-03 DIAGNOSIS — R1314 Dysphagia, pharyngoesophageal phase: Secondary | ICD-10-CM | POA: Diagnosis not present

## 2012-12-03 DIAGNOSIS — G8190 Hemiplegia, unspecified affecting unspecified side: Secondary | ICD-10-CM | POA: Diagnosis not present

## 2012-12-03 DIAGNOSIS — I69959 Hemiplegia and hemiparesis following unspecified cerebrovascular disease affecting unspecified side: Secondary | ICD-10-CM | POA: Diagnosis not present

## 2012-12-03 DIAGNOSIS — I69919 Unspecified symptoms and signs involving cognitive functions following unspecified cerebrovascular disease: Secondary | ICD-10-CM | POA: Diagnosis not present

## 2012-12-04 DIAGNOSIS — G8190 Hemiplegia, unspecified affecting unspecified side: Secondary | ICD-10-CM | POA: Diagnosis not present

## 2012-12-04 DIAGNOSIS — I69959 Hemiplegia and hemiparesis following unspecified cerebrovascular disease affecting unspecified side: Secondary | ICD-10-CM | POA: Diagnosis not present

## 2012-12-04 DIAGNOSIS — R609 Edema, unspecified: Secondary | ICD-10-CM | POA: Diagnosis not present

## 2012-12-04 DIAGNOSIS — R279 Unspecified lack of coordination: Secondary | ICD-10-CM | POA: Diagnosis not present

## 2012-12-04 DIAGNOSIS — M6281 Muscle weakness (generalized): Secondary | ICD-10-CM | POA: Diagnosis not present

## 2012-12-04 DIAGNOSIS — I69919 Unspecified symptoms and signs involving cognitive functions following unspecified cerebrovascular disease: Secondary | ICD-10-CM | POA: Diagnosis not present

## 2012-12-05 DIAGNOSIS — I69919 Unspecified symptoms and signs involving cognitive functions following unspecified cerebrovascular disease: Secondary | ICD-10-CM | POA: Diagnosis not present

## 2012-12-05 DIAGNOSIS — I69959 Hemiplegia and hemiparesis following unspecified cerebrovascular disease affecting unspecified side: Secondary | ICD-10-CM | POA: Diagnosis not present

## 2012-12-05 DIAGNOSIS — R609 Edema, unspecified: Secondary | ICD-10-CM | POA: Diagnosis not present

## 2012-12-05 DIAGNOSIS — R279 Unspecified lack of coordination: Secondary | ICD-10-CM | POA: Diagnosis not present

## 2012-12-05 DIAGNOSIS — G8190 Hemiplegia, unspecified affecting unspecified side: Secondary | ICD-10-CM | POA: Diagnosis not present

## 2012-12-05 DIAGNOSIS — M6281 Muscle weakness (generalized): Secondary | ICD-10-CM | POA: Diagnosis not present

## 2012-12-08 DIAGNOSIS — G8190 Hemiplegia, unspecified affecting unspecified side: Secondary | ICD-10-CM | POA: Diagnosis not present

## 2012-12-08 DIAGNOSIS — M6281 Muscle weakness (generalized): Secondary | ICD-10-CM | POA: Diagnosis not present

## 2012-12-08 DIAGNOSIS — R279 Unspecified lack of coordination: Secondary | ICD-10-CM | POA: Diagnosis not present

## 2012-12-08 DIAGNOSIS — I69919 Unspecified symptoms and signs involving cognitive functions following unspecified cerebrovascular disease: Secondary | ICD-10-CM | POA: Diagnosis not present

## 2012-12-08 DIAGNOSIS — R609 Edema, unspecified: Secondary | ICD-10-CM | POA: Diagnosis not present

## 2012-12-08 DIAGNOSIS — I69959 Hemiplegia and hemiparesis following unspecified cerebrovascular disease affecting unspecified side: Secondary | ICD-10-CM | POA: Diagnosis not present

## 2012-12-09 ENCOUNTER — Ambulatory Visit (INDEPENDENT_AMBULATORY_CARE_PROVIDER_SITE_OTHER): Payer: Medicare Other | Admitting: Interventional Cardiology

## 2012-12-09 ENCOUNTER — Ambulatory Visit (INDEPENDENT_AMBULATORY_CARE_PROVIDER_SITE_OTHER): Payer: Medicare Other | Admitting: *Deleted

## 2012-12-09 ENCOUNTER — Encounter: Payer: Self-pay | Admitting: Interventional Cardiology

## 2012-12-09 VITALS — BP 124/64 | HR 88 | Ht 66.0 in | Wt 159.3 lb

## 2012-12-09 DIAGNOSIS — R1310 Dysphagia, unspecified: Secondary | ICD-10-CM

## 2012-12-09 DIAGNOSIS — G8190 Hemiplegia, unspecified affecting unspecified side: Secondary | ICD-10-CM | POA: Diagnosis not present

## 2012-12-09 DIAGNOSIS — R609 Edema, unspecified: Secondary | ICD-10-CM | POA: Diagnosis not present

## 2012-12-09 DIAGNOSIS — M6281 Muscle weakness (generalized): Secondary | ICD-10-CM | POA: Diagnosis not present

## 2012-12-09 DIAGNOSIS — I6350 Cerebral infarction due to unspecified occlusion or stenosis of unspecified cerebral artery: Secondary | ICD-10-CM | POA: Diagnosis not present

## 2012-12-09 DIAGNOSIS — I10 Essential (primary) hypertension: Secondary | ICD-10-CM | POA: Diagnosis not present

## 2012-12-09 DIAGNOSIS — Z950 Presence of cardiac pacemaker: Secondary | ICD-10-CM

## 2012-12-09 DIAGNOSIS — I442 Atrioventricular block, complete: Secondary | ICD-10-CM

## 2012-12-09 DIAGNOSIS — I69919 Unspecified symptoms and signs involving cognitive functions following unspecified cerebrovascular disease: Secondary | ICD-10-CM | POA: Diagnosis not present

## 2012-12-09 DIAGNOSIS — I69959 Hemiplegia and hemiparesis following unspecified cerebrovascular disease affecting unspecified side: Secondary | ICD-10-CM | POA: Diagnosis not present

## 2012-12-09 DIAGNOSIS — R279 Unspecified lack of coordination: Secondary | ICD-10-CM | POA: Diagnosis not present

## 2012-12-09 DIAGNOSIS — I639 Cerebral infarction, unspecified: Secondary | ICD-10-CM

## 2012-12-09 LAB — PACEMAKER DEVICE OBSERVATION
AL AMPLITUDE: 2 mv
AL THRESHOLD: 0.75 V
BAMS-0001: 175 {beats}/min

## 2012-12-09 NOTE — Progress Notes (Signed)
Device check in clinic, all functions normal, changed Atrial output from 1.5V to 2.0V, full details in [facility].  ROV w/ Dr. Graciela Husbands in 3 mo.

## 2012-12-09 NOTE — Progress Notes (Signed)
Patient ID: Rachel Cooke, female   DOB: 1919/08/09, 79 y.o.   MRN: [id number]    [address] [place], [place]  [address] Phone: [phone] Fax:  [phone]  Date:  12/09/2012   ID:  Rachel Cooke, Rachel Cooke 03-11-1919, MRN [id number]  PCP:  Kimber Relic, MD      History of Present Illness: Rachel Cooke is a 79 y.o. female who has had a pacer placed. She has a pureed diet after a stroke.  SHe had an impacted esophagus.  She has trouble swallowing.  She developed some ankle swelling. This has resolved with compression stockings. Her BP is well controlled at home. Labs were well controlled. A1C was 6.6. Her feet are doing better with different shoes. She has some mild fatigue. She walks slowly with a walker with assistance. She has some SHOB with anxiety. She will walk for 15 minutes at a time with therapy.  Small NSTEMI at time of CVA in 4/14. CAD/ASCVD:  c/o Dyspnea on exertion no change. Walks only with assistance. Family feels that she is getting more deconditioned.  c/o Fatigue getting worse.  Denies : Chest pain.  Nitroglycerin.  She has lost weight with her esophageal problems.   Wt Readings from Last 3 Encounters:  12/09/12 159 lb 4.8 oz (72.258 kg)  11/28/12 161 lb 3.2 oz (73.12 kg)  10/17/12 172 lb (78.019 kg)     Past Medical History  Diagnosis Date  . Cancer   . Thyroid disease   . Hypertension   . High cholesterol   . Dendritic keratitis 2012  . Malignant neoplasm of breast (female), unspecified site 1970    S/P Rt radical mastectomy  . Unspecified hypothyroidism 2009  . Type II or unspecified type diabetes mellitus without mention of complication, not stated as uncontrolled 2003  . Unspecified vitamin D deficiency 2009  . Pure hyperglyceridemia 2004  . Anxiety state, unspecified   . Macular degeneration (senile) of retina, unspecified 2012  . Unspecified glaucoma(365.9) 2013  . Tinnitus 2007  . Unspecified essential hypertension 2000  .  Coronary atherosclerosis of native coronary artery 2003    non obstructive by Cath 2003  . Atrioventricular block, complete 2003    s/p PPM 2003, generator chnage 2011  . Allergic rhinitis due to pollen   . Acquired cyst of kidney 2002  . Osteoarthrosis, unspecified whether generalized or localized, unspecified site 2013  . Unspecified arthropathy, pelvic region and thigh 2010  . Senile osteoporosis 2003  . Abnormality of gait 2007  . Edema 2003  . Personal history of colonic polyps      s/p colonoscopy/polypectomy 2003  . Cardiac pacemaker in situ 2003  . CVA (cerebral infarction) 06/28/2012    rt hemiparesis, dysphagia  . NSTEMI (non-ST elevated myocardial infarction) 06/28/2012  . Urinary retention 07/07/2012  . Pacemaker   . Stroke 06/28/2012  . Food impaction of esophagus 08/15/2012  . Esophageal dysmotility 08/19/2012  . Acute sinus infection 11/25/2012  . Altered mental status 11/28/2012    Current Outpatient Prescriptions  Medication Sig Dispense Refill  . acetaminophen (TYLENOL) 650 MG CR tablet Take 650 mg by mouth 2 (two) times daily.      . AMOXICILLIN PO Take by mouth as directed.      . cetirizine (ZYRTEC) 10 MG tablet Take 10 mg by mouth daily.      . ciprofloxacin (CIPRO) 250 MG tablet Take 250 mg by mouth as directed.      Marland Kitchen  clopidogrel (PLAVIX) 75 MG tablet Take 75 mg by mouth daily.      . diclofenac sodium (VOLTAREN) 1 % GEL Apply 2 g topically 3 (three) times daily.       Marland Kitchen escitalopram (LEXAPRO) 20 MG tablet Take 20 mg by mouth daily.      . Fluconazole (DIFLUCAN PO) Take by mouth as directed.      Marland Kitchen HYDROCORTISONE EX Apply topically as directed.      . hydroxypropyl methylcellulose (ISOPTO TEARS) 2.5 % ophthalmic solution 1 drop.      Marland Kitchen levothyroxine (SYNTHROID, LEVOTHROID) 75 MCG tablet Take 75 mcg by mouth daily before breakfast.      . liver oil-zinc oxide (DESITIN) 40 % ointment Apply 1 application topically as needed. Apply to rash on buttox/inner thighs  after each BM      . losartan (COZAAR) 50 MG tablet Take 1 tablet (50 mg total) by mouth daily.      . Menthol-Zinc Oxide (CALMOSEPTINE) 0.44-20.625 % OINT Apply 1 application topically as needed. Apply to peri-anal skin and inner buttox after each incontinent episode and PRN      . metoprolol succinate (TOPROL-XL) 50 MG 24 hr tablet Take 50 mg by mouth daily. Take with or immediately following a meal.      . Multiple Vitamins-Minerals (MULTIVITAMIN WITH MINERALS) tablet Take 1 tablet by mouth daily.      . NON FORMULARY Biotene Spray      . omeprazole (PRILOSEC) 20 MG capsule Take 20 mg by mouth daily.       No current facility-administered medications for this visit.    Allergies:    Allergies  Allergen Reactions  . Lactose Intolerance (Gi)     Social History:  The patient  reports that she has never smoked. She does not have any smokeless tobacco history on file. She reports that she does not drink alcohol.   Family History:  The patient's family history is not on file.   ROS:  Please see the history of present illness.  No nausea, vomiting.  No fevers, chills.  No focal weakness.  No dysuria. Confusion with some medications.  Trouble swallowing.    All other systems reviewed and negative.   PHYSICAL EXAM: VS:  BP 124/64  Pulse 88  Ht 5\' 6"  (1.676 m)  Wt 159 lb 4.8 oz (72.258 kg)  BMI 25.72 kg/m2  SpO2 95% Well nourished, well developed, in no acute distress HEENT: normal Neck: no JVD, no carotid bruits Cardiac:  normal S1, S2, S4; RRR; 2/6 murmur Lungs:  clear to auscultation bilaterally, no wheezing, rhonchi or rales Abd: soft, nontender, no hepatomegaly Ext: compression stockings Skin: warm and dry Neuro:   no focal abnormalities noted     ASSESSMENT AND PLAN:  Atrioventricular block, complete  Diagnostic Imaging:EKG Harward,Amy 12/11/2011 11:40:16 AM > Abby Tucholski,JAY 12/11/2011 12:23:20 PM > paced rhythm  status post pacemaker.  Spoke at length with the family about  the patient's exercise capacity. I don't think there are any limitations from cardiac standpoint as her LV function was normal at last check by echocardiogram. Her major limitations will be balance and orthopedic issues.  2. Essential hypertension, benign  Continue Losartan Potassium Tablet, 50 MG, 1 tablet, Orally, Once a day Continue Metoprolol Succinate Tablet Extended Release 24 Hour, 50 MG, 1 tablet, Orally, Once a day COntrolled at home.  3. Pure hypercholesterolemia  Continue Niaspan Tablet Extended Release, 500 MG, 2 tablets at bedtime, Orally, Once a day Continue Tricor  Tablet, 145 MG, 1 tablet, Orally, Once a day   4. Cardiac pacemaker in situ  Continue regular pacer checks.   Headache today.  Not related to BP since she has a 4/10 h/a now while BP well controlled.    Signed, Fredric Mare, MD, [facility] 12/09/2012 12:01 PM

## 2012-12-09 NOTE — Patient Instructions (Addendum)
Your physician wants you to follow-up in: 1 year follow up with Dr. Eldridge Dace. You will receive a reminder letter in the mail two months in advance. If you don't receive a letter, please call our office to schedule the follow-up appointment.  Your physician recommends that you continue on your current medications as directed. Please refer to the Current Medication list given to you today.  You will have your pacemaker checked today.

## 2012-12-10 ENCOUNTER — Encounter: Payer: Self-pay | Admitting: Interventional Cardiology

## 2012-12-10 DIAGNOSIS — R279 Unspecified lack of coordination: Secondary | ICD-10-CM | POA: Diagnosis not present

## 2012-12-10 DIAGNOSIS — I69919 Unspecified symptoms and signs involving cognitive functions following unspecified cerebrovascular disease: Secondary | ICD-10-CM | POA: Diagnosis not present

## 2012-12-10 DIAGNOSIS — M6281 Muscle weakness (generalized): Secondary | ICD-10-CM | POA: Diagnosis not present

## 2012-12-10 DIAGNOSIS — R609 Edema, unspecified: Secondary | ICD-10-CM | POA: Diagnosis not present

## 2012-12-10 DIAGNOSIS — I69959 Hemiplegia and hemiparesis following unspecified cerebrovascular disease affecting unspecified side: Secondary | ICD-10-CM | POA: Diagnosis not present

## 2012-12-10 DIAGNOSIS — G8190 Hemiplegia, unspecified affecting unspecified side: Secondary | ICD-10-CM | POA: Diagnosis not present

## 2012-12-11 ENCOUNTER — Encounter: Payer: Self-pay | Admitting: Geriatric Medicine

## 2012-12-11 ENCOUNTER — Non-Acute Institutional Stay (SKILLED_NURSING_FACILITY): Payer: Medicare Other | Admitting: Geriatric Medicine

## 2012-12-11 DIAGNOSIS — G8190 Hemiplegia, unspecified affecting unspecified side: Secondary | ICD-10-CM | POA: Diagnosis not present

## 2012-12-11 DIAGNOSIS — F411 Generalized anxiety disorder: Secondary | ICD-10-CM

## 2012-12-11 DIAGNOSIS — R4182 Altered mental status, unspecified: Secondary | ICD-10-CM | POA: Diagnosis not present

## 2012-12-11 DIAGNOSIS — K224 Dyskinesia of esophagus: Secondary | ICD-10-CM

## 2012-12-11 DIAGNOSIS — R609 Edema, unspecified: Secondary | ICD-10-CM | POA: Diagnosis not present

## 2012-12-11 DIAGNOSIS — M6281 Muscle weakness (generalized): Secondary | ICD-10-CM | POA: Diagnosis not present

## 2012-12-11 DIAGNOSIS — R279 Unspecified lack of coordination: Secondary | ICD-10-CM | POA: Diagnosis not present

## 2012-12-11 DIAGNOSIS — E119 Type 2 diabetes mellitus without complications: Secondary | ICD-10-CM

## 2012-12-11 DIAGNOSIS — I69351 Hemiplegia and hemiparesis following cerebral infarction affecting right dominant side: Secondary | ICD-10-CM | POA: Insufficient documentation

## 2012-12-11 DIAGNOSIS — I69959 Hemiplegia and hemiparesis following unspecified cerebrovascular disease affecting unspecified side: Secondary | ICD-10-CM | POA: Diagnosis not present

## 2012-12-11 DIAGNOSIS — I69919 Unspecified symptoms and signs involving cognitive functions following unspecified cerebrovascular disease: Secondary | ICD-10-CM | POA: Diagnosis not present

## 2012-12-11 NOTE — Progress Notes (Signed)
Patient ID: Rachel Cooke, female   DOB: [birth year], 79 y.o.   MRN: [id number] [facility] [phone])  Code Status: DNR Contact Information   Name Relation Home Work [place] Daughter [phone]  [phone]      Chief Complaint  Patient presents with  . Medical Managment of Chronic Issues    CVA, DM, HTN, esophageal dysmotility ,AMS    HPI: This is a 79 y.o. female resident of [facility], Skilled Nursing section evaluated today for management of ongoing medical issues.  Review of record shows  patient's blood pressure has been well controlled, fasting daily CBGs satisfactory. The patient's intake and output is adequate though record does show she has lost some weight. Her BMI remains in the overweight range, recent lab satisfactory. She is tolerating pureed diet with no sign of impaired passage of food through her esophagus.    Confusion and combative behavior that was evident at last visit has resolved. Etiology of this altered mental status is still not clear; labs studies were satisfactory, there is no sign of infection or metabolic disturbance. Medication change was stopping her antihistamine.  Patient continues to work with PT and OT to maximize function within the limitations of deficits related to her CVA April 2014.   Care planning meeting (60 minutes) was held today with patient's daughter and son-in-law, therapy, social work, dietary, nursing and primary care represented.  Patient continues to make some progress with physical/occupational therapy but remains remains at a skilled level of care. It is expected she'll not progress from this level of care though her activity level may improve somewhat. She is able to walk with PT, not able to walk with staff. Will likely continue to require a Stand up Lift for extended period of time. Now that Rt.arm edema has resolved, Occupational therapy has restarted interventions directed  toward patient self-care with ADLs. The patient currently is Max assist with all ADLs. A good portion of this meeting was discussing patient's mental/psychological situation. New information for me is this patient's long history of grief and loss issues that have been unresolved. She is having significant difficulty with acceptance of her current situation. Counseling has been recommended, family strongly supports this intervention. Patient does exhibit cognitive issues related to CVA including significant short-term memory loss, misinterpretation of environmental signals. Family here is her interpretation of the environment described any paranoid fashion. The patient does not share these concerns with the nursing staff.    Allergies  Allergen Reactions  . Lactose Intolerance (Gi)    Medications Reviewed  DATA REVIEWED  Radiologic Exams:   Cardiovascular Exams:   Laboratory Studies   [facility], external  08/21/2012: Glucose 101, BUN 5, creatinine 0.46, sodium 139, potassium 3.6. LFTs WNL. Albumin  3.0 GFR 88   TSH 2.48  09/06/2012 urinalysis. Large blood, 30 protein, positive nitrate, large leukocyte esterase, many bacteria  09/10/2102: WBC 5.2, Hgb 11.9, Hct 36.3, plt 199   Glu 121, BUN 9, Cr. .46, Na 141, K+ 3.7 Albumin 3.4   TC 162, TG 242, HDL 31, LDL 94   A1C 6.0 11/05/2012 urine culture:greater than 100,000 Klebsiella pneumoniae  11/27/2012 WBC 8.4, hemoglobin 13.8, hematocrit 41.6, platelets 176  Glucose 105, BUN 9, creatinine 0.59, sodium 142, potassium 3.4. Protein/LFTs WNL 11/29/2012 urine culture no growth   Review of Systems  DATA OBTAINED: from patient, nurse, medical record,   GENERAL: Feels well, no H/A. No fevers, appetite satisfactory SKIN: No itch, rash or open wounds  EYES: No eye pain, No change in vision    EARS: No earache, change in hearing   NOSE: Mild nasal congestion, No drainage or bleeding  MOUTH/THROAT: No mouth or tooth pain, No Sore throat   RESPIRATORY: No cough, wheezing, SOB   CARDIAC: No chest pain, palpitations   MUSCULOSKELETAL: No joint pain, swelling or stiffness No back pain No muscle ache, pain, Weakness rt arm/ leg   NEUROLOGIC: No dizziness, fainting, headache, Baseline mental status. Rt. hemiparesis   PSYCHIATRIC: No feelings of anxiety, depression Sleeps well.   Physical Exam Filed Vitals:   12/11/12 0935  BP: 145/73  Pulse: 80  Temp: 96.6 F (35.9 C)  Resp: 18  Weight: 159 lb 4.8 oz (72.258 kg)   Body mass index is 25.72 kg/(m^2). GENERAL APPEARANCE: No acute distress, appropriately groomed, Overweight body habitus. Alert, pleasant, conversant.  HEAD: Normocephalic, atraumatic   EYES: Conjunctiva/lids clear. Pupils round, reactive. No photophobia   EARS: Decreased Hearing (not new)   NOSE: No deformity or discharge.   MOUTH/THROAT: Lips w/o lesions. Oral mucosa, tongue moist, w/o lesion. Oropharynx w/o redness or lesions.   NECK: Supple, full ROM. No thyroid tenderness, enlargement or nodule   LYMPHATICS: No cervical or supraclavicular adenopathy   RESPIRATORY: Breathing is even, unlabored. Lung sounds are clear and full.   CARDIOVASCULAR: Heart RRR. No murmur or extra heart sounds               EDEMA:  No edema rt.arm or hand . Trace bilateral ankle edema   MUSCULOSKELTAL:  Rt. Hand without edema, painless  hand grip, non tender knuckles. Right shoulder joint is less tender, decreased range of motion evident NEUROLOGIC: Oriented to time,place, person- Mild difficulty witn word finding Speech clear, no tremor.  Rt. Leg weakness evident, improved ROM  PSYCHIATRIC: Mood and affect appropriate to situation  ASSESSMENT/PLAN  Esophageal dysmotility Stable, continues to tolerate pureed diet  Type II or unspecified type diabetes mellitus without mention of complication, not stated as uncontrolled CBGs satisfactory, continue present medication  Hemiparesis affecting right side as late effect of  cerebrovascular accident PT/OT interventions were interrupted due to AMS,now back on track. Will continue efforts to maximize functional status, anticipate patient will require Skilled level of care long term.  Altered mental status AMS has resolved, exact etiology unclear. Possible small CVA? Possible cumulative effect of cetirizine? Continue to monitor  Anxiety state, unspecified Long standing issues with anxiety, depression, unresolved grief. Have recommended counseling in additionto current medication. Family is in agreement, hopefully patient will accept as well.   Time: , greater than 50% spent counseling/ care coordination  Follow up: Routine or as needed  Angla Delahunt T.Sou Nohr, NP-C 12/11/2012

## 2012-12-12 DIAGNOSIS — G8190 Hemiplegia, unspecified affecting unspecified side: Secondary | ICD-10-CM | POA: Diagnosis not present

## 2012-12-12 DIAGNOSIS — M6281 Muscle weakness (generalized): Secondary | ICD-10-CM | POA: Diagnosis not present

## 2012-12-12 DIAGNOSIS — R609 Edema, unspecified: Secondary | ICD-10-CM | POA: Diagnosis not present

## 2012-12-12 DIAGNOSIS — I69919 Unspecified symptoms and signs involving cognitive functions following unspecified cerebrovascular disease: Secondary | ICD-10-CM | POA: Diagnosis not present

## 2012-12-12 DIAGNOSIS — R279 Unspecified lack of coordination: Secondary | ICD-10-CM | POA: Diagnosis not present

## 2012-12-12 DIAGNOSIS — I69959 Hemiplegia and hemiparesis following unspecified cerebrovascular disease affecting unspecified side: Secondary | ICD-10-CM | POA: Diagnosis not present

## 2012-12-13 DIAGNOSIS — I69959 Hemiplegia and hemiparesis following unspecified cerebrovascular disease affecting unspecified side: Secondary | ICD-10-CM | POA: Diagnosis not present

## 2012-12-13 DIAGNOSIS — M6281 Muscle weakness (generalized): Secondary | ICD-10-CM | POA: Diagnosis not present

## 2012-12-13 DIAGNOSIS — G8190 Hemiplegia, unspecified affecting unspecified side: Secondary | ICD-10-CM | POA: Diagnosis not present

## 2012-12-13 DIAGNOSIS — I69919 Unspecified symptoms and signs involving cognitive functions following unspecified cerebrovascular disease: Secondary | ICD-10-CM | POA: Diagnosis not present

## 2012-12-13 DIAGNOSIS — R279 Unspecified lack of coordination: Secondary | ICD-10-CM | POA: Diagnosis not present

## 2012-12-13 DIAGNOSIS — R609 Edema, unspecified: Secondary | ICD-10-CM | POA: Diagnosis not present

## 2012-12-14 DIAGNOSIS — I69959 Hemiplegia and hemiparesis following unspecified cerebrovascular disease affecting unspecified side: Secondary | ICD-10-CM | POA: Diagnosis not present

## 2012-12-14 DIAGNOSIS — R279 Unspecified lack of coordination: Secondary | ICD-10-CM | POA: Diagnosis not present

## 2012-12-14 DIAGNOSIS — M6281 Muscle weakness (generalized): Secondary | ICD-10-CM | POA: Diagnosis not present

## 2012-12-14 DIAGNOSIS — R609 Edema, unspecified: Secondary | ICD-10-CM | POA: Diagnosis not present

## 2012-12-14 DIAGNOSIS — I69919 Unspecified symptoms and signs involving cognitive functions following unspecified cerebrovascular disease: Secondary | ICD-10-CM | POA: Diagnosis not present

## 2012-12-14 DIAGNOSIS — G8190 Hemiplegia, unspecified affecting unspecified side: Secondary | ICD-10-CM | POA: Diagnosis not present

## 2012-12-15 DIAGNOSIS — G8190 Hemiplegia, unspecified affecting unspecified side: Secondary | ICD-10-CM | POA: Diagnosis not present

## 2012-12-15 DIAGNOSIS — R279 Unspecified lack of coordination: Secondary | ICD-10-CM | POA: Diagnosis not present

## 2012-12-15 DIAGNOSIS — M6281 Muscle weakness (generalized): Secondary | ICD-10-CM | POA: Diagnosis not present

## 2012-12-15 DIAGNOSIS — R609 Edema, unspecified: Secondary | ICD-10-CM | POA: Diagnosis not present

## 2012-12-15 DIAGNOSIS — I69919 Unspecified symptoms and signs involving cognitive functions following unspecified cerebrovascular disease: Secondary | ICD-10-CM | POA: Diagnosis not present

## 2012-12-15 DIAGNOSIS — I69959 Hemiplegia and hemiparesis following unspecified cerebrovascular disease affecting unspecified side: Secondary | ICD-10-CM | POA: Diagnosis not present

## 2012-12-16 DIAGNOSIS — I69919 Unspecified symptoms and signs involving cognitive functions following unspecified cerebrovascular disease: Secondary | ICD-10-CM | POA: Diagnosis not present

## 2012-12-16 DIAGNOSIS — R609 Edema, unspecified: Secondary | ICD-10-CM | POA: Diagnosis not present

## 2012-12-16 DIAGNOSIS — I69959 Hemiplegia and hemiparesis following unspecified cerebrovascular disease affecting unspecified side: Secondary | ICD-10-CM | POA: Diagnosis not present

## 2012-12-16 DIAGNOSIS — M6281 Muscle weakness (generalized): Secondary | ICD-10-CM | POA: Diagnosis not present

## 2012-12-16 DIAGNOSIS — G8190 Hemiplegia, unspecified affecting unspecified side: Secondary | ICD-10-CM | POA: Diagnosis not present

## 2012-12-16 DIAGNOSIS — R279 Unspecified lack of coordination: Secondary | ICD-10-CM | POA: Diagnosis not present

## 2012-12-17 DIAGNOSIS — R279 Unspecified lack of coordination: Secondary | ICD-10-CM | POA: Diagnosis not present

## 2012-12-17 DIAGNOSIS — I69959 Hemiplegia and hemiparesis following unspecified cerebrovascular disease affecting unspecified side: Secondary | ICD-10-CM | POA: Diagnosis not present

## 2012-12-17 DIAGNOSIS — R609 Edema, unspecified: Secondary | ICD-10-CM | POA: Diagnosis not present

## 2012-12-17 DIAGNOSIS — Z23 Encounter for immunization: Secondary | ICD-10-CM | POA: Diagnosis not present

## 2012-12-17 DIAGNOSIS — G8190 Hemiplegia, unspecified affecting unspecified side: Secondary | ICD-10-CM | POA: Diagnosis not present

## 2012-12-17 DIAGNOSIS — I69919 Unspecified symptoms and signs involving cognitive functions following unspecified cerebrovascular disease: Secondary | ICD-10-CM | POA: Diagnosis not present

## 2012-12-17 DIAGNOSIS — M6281 Muscle weakness (generalized): Secondary | ICD-10-CM | POA: Diagnosis not present

## 2012-12-19 DIAGNOSIS — I69919 Unspecified symptoms and signs involving cognitive functions following unspecified cerebrovascular disease: Secondary | ICD-10-CM | POA: Diagnosis not present

## 2012-12-19 DIAGNOSIS — R279 Unspecified lack of coordination: Secondary | ICD-10-CM | POA: Diagnosis not present

## 2012-12-19 DIAGNOSIS — G8190 Hemiplegia, unspecified affecting unspecified side: Secondary | ICD-10-CM | POA: Diagnosis not present

## 2012-12-19 DIAGNOSIS — R609 Edema, unspecified: Secondary | ICD-10-CM | POA: Diagnosis not present

## 2012-12-19 DIAGNOSIS — I69959 Hemiplegia and hemiparesis following unspecified cerebrovascular disease affecting unspecified side: Secondary | ICD-10-CM | POA: Diagnosis not present

## 2012-12-19 DIAGNOSIS — M6281 Muscle weakness (generalized): Secondary | ICD-10-CM | POA: Diagnosis not present

## 2012-12-20 DIAGNOSIS — G8190 Hemiplegia, unspecified affecting unspecified side: Secondary | ICD-10-CM | POA: Diagnosis not present

## 2012-12-20 DIAGNOSIS — I69919 Unspecified symptoms and signs involving cognitive functions following unspecified cerebrovascular disease: Secondary | ICD-10-CM | POA: Diagnosis not present

## 2012-12-20 DIAGNOSIS — I69959 Hemiplegia and hemiparesis following unspecified cerebrovascular disease affecting unspecified side: Secondary | ICD-10-CM | POA: Diagnosis not present

## 2012-12-20 DIAGNOSIS — R279 Unspecified lack of coordination: Secondary | ICD-10-CM | POA: Diagnosis not present

## 2012-12-20 DIAGNOSIS — R609 Edema, unspecified: Secondary | ICD-10-CM | POA: Diagnosis not present

## 2012-12-20 DIAGNOSIS — M6281 Muscle weakness (generalized): Secondary | ICD-10-CM | POA: Diagnosis not present

## 2012-12-22 ENCOUNTER — Ambulatory Visit (HOSPITAL_COMMUNITY)
Admission: AD | Admit: 2012-12-22 | Discharge: 2012-12-22 | Disposition: A | Discharge status: Home / Self Care | Payer: Medicare Other | Source: Ambulatory Visit | Attending: Gastroenterology | Admitting: Gastroenterology

## 2012-12-22 ENCOUNTER — Non-Acute Institutional Stay (SKILLED_NURSING_FACILITY): Payer: Medicare Other | Admitting: Geriatric Medicine

## 2012-12-22 ENCOUNTER — Encounter (HOSPITAL_COMMUNITY)
Admission: AD | Disposition: A | Discharge status: Home / Self Care | Payer: Self-pay | Source: Ambulatory Visit | Attending: Gastroenterology

## 2012-12-22 DIAGNOSIS — K224 Dyskinesia of esophagus: Secondary | ICD-10-CM | POA: Insufficient documentation

## 2012-12-22 DIAGNOSIS — D131 Benign neoplasm of stomach: Secondary | ICD-10-CM | POA: Diagnosis not present

## 2012-12-22 DIAGNOSIS — Q391 Atresia of esophagus with tracheo-esophageal fistula: Secondary | ICD-10-CM | POA: Diagnosis not present

## 2012-12-22 DIAGNOSIS — I69959 Hemiplegia and hemiparesis following unspecified cerebrovascular disease affecting unspecified side: Secondary | ICD-10-CM | POA: Diagnosis not present

## 2012-12-22 DIAGNOSIS — G8190 Hemiplegia, unspecified affecting unspecified side: Secondary | ICD-10-CM | POA: Diagnosis not present

## 2012-12-22 DIAGNOSIS — T18108A Unspecified foreign body in esophagus causing other injury, initial encounter: Secondary | ICD-10-CM | POA: Insufficient documentation

## 2012-12-22 DIAGNOSIS — R279 Unspecified lack of coordination: Secondary | ICD-10-CM | POA: Diagnosis not present

## 2012-12-22 DIAGNOSIS — K449 Diaphragmatic hernia without obstruction or gangrene: Secondary | ICD-10-CM | POA: Insufficient documentation

## 2012-12-22 DIAGNOSIS — IMO0002 Reserved for concepts with insufficient information to code with codable children: Secondary | ICD-10-CM | POA: Insufficient documentation

## 2012-12-22 DIAGNOSIS — K222 Esophageal obstruction: Secondary | ICD-10-CM | POA: Diagnosis not present

## 2012-12-22 DIAGNOSIS — Z8673 Personal history of transient ischemic attack (TIA), and cerebral infarction without residual deficits: Secondary | ICD-10-CM | POA: Insufficient documentation

## 2012-12-22 DIAGNOSIS — R1310 Dysphagia, unspecified: Secondary | ICD-10-CM | POA: Insufficient documentation

## 2012-12-22 DIAGNOSIS — I69919 Unspecified symptoms and signs involving cognitive functions following unspecified cerebrovascular disease: Secondary | ICD-10-CM | POA: Diagnosis not present

## 2012-12-22 DIAGNOSIS — M6281 Muscle weakness (generalized): Secondary | ICD-10-CM | POA: Diagnosis not present

## 2012-12-22 DIAGNOSIS — R609 Edema, unspecified: Secondary | ICD-10-CM | POA: Diagnosis not present

## 2012-12-22 HISTORY — PX: ESOPHAGOGASTRODUODENOSCOPY: SHX5428

## 2012-12-22 SURGERY — EGD (ESOPHAGOGASTRODUODENOSCOPY)
Anesthesia: Moderate Sedation

## 2012-12-22 MED ORDER — SODIUM CHLORIDE 0.9 % IV SOLN
INTRAVENOUS | Status: DC
  Administered 2012-12-22: 17:00:00 via INTRAVENOUS

## 2012-12-22 MED ORDER — MIDAZOLAM HCL 5 MG/ML IJ SOLN
INTRAMUSCULAR | Status: AC
  Filled 2012-12-22: qty 2

## 2012-12-22 MED ORDER — FENTANYL CITRATE 0.05 MG/ML IJ SOLN
INTRAMUSCULAR | Status: AC
  Filled 2012-12-22: qty 2

## 2012-12-22 MED ORDER — MIDAZOLAM HCL 10 MG/2ML IJ SOLN
INTRAMUSCULAR | Status: DC | PRN
  Administered 2012-12-22 (×4): 1 mg via INTRAVENOUS

## 2012-12-22 MED ORDER — FENTANYL CITRATE 0.05 MG/ML IJ SOLN
INTRAMUSCULAR | Status: DC | PRN
  Administered 2012-12-22: 25 ug via INTRAVENOUS

## 2012-12-22 NOTE — Interval H&P Note (Signed)
History and Physical Interval Note:  12/22/2012 3:57 PM  Rachel Cooke  has presented today for surgery, with the diagnosis of food impaction   The various methods of treatment have been discussed with the patient and family. After consideration of risks, benefits and other options for treatment, the patient has consented to  Procedure(s): ESOPHAGOGASTRODUODENOSCOPY (EGD) (N/A) as a surgical intervention .  The patient's history has been reviewed, patient examined, no change in status, stable for surgery.  I have reviewed the patient's chart and labs.  Questions were answered to the patient's satisfaction.     Athea Haley M  Assessment:  1.  Suspected esophageal food impaction. 2.  Prior stroke, on chronic clopidigrel. 3.  Multiple other medical problems.  Plan:  1.  Endoscopy with possible esophageal foreign body removal. 2.  Risks (bleeding, infection, bowel perforation that could require surgery, sedation-related changes in cardiopulmonary systems), benefits (identification and possible treatment of source of symptoms, exclusion of certain causes of symptoms), and alternatives (watchful waiting, radiographic imaging studies, empiric medical treatment) of upper endoscopy (EGD) were explained to patient/family in detail and patient wishes to proceed.

## 2012-12-22 NOTE — Progress Notes (Signed)
Patient ID: Rachel Cooke, female   DOB: 02/15/1920, 79 y.o.   MRN: [id number] [facility] (31)  Code Status: DNR Contact Information   Name Relation Home Work [place]   [place]ett,Rachel Cooke Daughter [phone]  [phone]       Chief Complaint  Patient presents with  . Possible esophageal obstruction    HPI: This is a 79 yo female resident of [facility], Skilled nursing section. I was called to her room urgently this afternoon due to s/s similar to previous episode of esophageal impaction. Patient ate breakfast (pureed diet) as usual this AM, though she reports "coffee didn't taste quite right". She did not feel well at lunch time, did not eat lunch. Was working with ST this afternoon, drank some water, had "belching' and regurgitation of the water. Has continued to have intermittent spitting up of clear mucus.    Allergies  Allergen Reactions  . Lactose Intolerance (Gi)    Medications Reviewed  DATA REVIEWED  Radiologic Exams:   Cardiovascular Exams:   Laboratory Studies  [facility] Lab 08/21/2012: Glucose 101, BUN 5, creatinine 0.46, sodium 139, potassium 3.6. LFTs WNL. Albumin 3.0 GFR 88   TSH 2.48  09/06/2012 urinalysis. Large blood, 30 protein, positive nitrate, large leukocyte esterase, many bacteria  09/10/2102: WBC 5.2, Hgb 11.9, Hct 36.3, plt 199   Glu 121, BUN 9, Cr. .46, Na 141, K+ 3.7 Albumin 3.4   TC 162, TG 242, HDL 31, LDL 94   A1C 6.0  11/05/2012 urine culture:greater than 100,000 Klebsiella pneumoniae  11/27/2012 WBC 8.4, hemoglobin 13.8, hematocrit 41.6, platelets 176   Glucose 105, BUN 9, creatinine 0.59, sodium 142, potassium 3.4. Protein/LFTs WNL  11/29/2012 urine culture no growth     Review of Systems  DATA OBTAINED: from patient, nurse, SLP GENERAL: Feels "OK"    MOUTH/THROAT: No mouth or tooth pain     No sore throat     No difficulty  Swallowing - has regurgitation- see HPI RESPIRATORY: No cough,  wheezing, SOB CARDIAC: No chest pain, palpitations  No edema. GI: No abdominal pain  Mild nausea   Last BM yesterday GU: No dysuria, frequency or urgency  No change in urine volume or character   NEUROLOGIC: No dizziness, fainting, headache, No change in mental status.  PSYCHIATRIC: No increased anxiety, depression Sleeps well.  No behavior issue.    Physical Exam There were no vitals filed for this visit. There is no weight on file to calculate BMI.  GENERAL APPEARANCE: No acute distress, appropriately groomed, Overweight body habitus. Alert, pleasant, less conversant than usual . SKIN: No diaphoresis, rash,   HEAD: Normocephalic, atraumatic EYES: Conjunctiva/lids clear.  MOUTH/THROAT: Lips w/o lesions. Oral mucosa, tongue moist, w/o lesion. Oropharynx w/o redness or lesions. Spitting up clear mucus NECK: Supple, full ROM. No thyroid tenderness, enlargement or nodule LYMPHATICS: No head, neck or supraclavicular adenopathy RESPIRATORY: Breathing is even, unlabored. Lung sounds are clear and full.  CARDIOVASCULAR: Heart RRR. No murmur or extra heart sounds GASTROINTESTINAL: Abdomen is soft, non-tender, not distended w/ normal bowel sounds. No hepatic or splenic enlargement. No mass, ventral or inguinal hernia. NEUROLOGIC: Oriented to time, place, person.  PSYCHIATRIC: Mood and affect appropriate to situation  ASSESSMENT/PLAN  Esophageal dysmotility Patient with appearance similar to esophageal impaction 08/2012. Discussed with Dr. Magod, he recommends urgent evaluation by gastroenterologist in hospital. Dr.Outlaw is in Endoscopy suite at [facility], he will see her this afternoon. Will arrange transport to Endoscopy at [facility]. Discussed situation with patient's   daughter, [name]. She will meet patient at endoscopy unit.    Follow up: As needed  Mirel Hundal T.Khushboo Chuck, NP-C 12/22/2012  

## 2012-12-22 NOTE — Assessment & Plan Note (Signed)
PT/OT interventions were interrupted due to AMS,now back on track. Will continue efforts to maximize functional status, anticipate patient will require Skilled level of care long term.

## 2012-12-22 NOTE — Assessment & Plan Note (Signed)
AMS has resolved, exact etiology unclear. Possible small CVA? Possible cumulative effect of cetirizine? Continue to monitor

## 2012-12-22 NOTE — Op Note (Signed)
[facility] [address] [place], [address]   ENDOSCOPY PROCEDURE REPORT  PATIENT: Rachel Cooke, Rachel Cooke  MR#: [id number] BIRTHDATE: 14-Feb-1920 , 89  yrs. old GENDER: Female ENDOSCOPIST: Willis Modena, MD REFERRED BY:  Maxwell Marion, FNP. PROCEDURE DATE:  12/22/2012 PROCEDURE:  EGD w/ fb removal ASA CLASS:     Class III INDICATIONS:  dysphagia, suspected esophageal food impaction. MEDICATIONS: Fentanyl 25 mcg IV and Versed 4 mg IV TOPICAL ANESTHETIC:  DESCRIPTION OF PROCEDURE: After the risks benefits and alternatives of the procedure were thoroughly explained, informed consent was obtained.  The Pentax Gastroscope Peds [id number] endoscope was introduced through the mouth and advanced to the second portion of the duodenum. Without limitations.  The instrument was slowly withdrawn as the mucosa was fully examined.     Findings:  Large bolus of solid fibrous flaky food obstructing the distal half of the esophagus.  With time, the bolus was disrupted with a variety of tools.  The bolus was then gently nudged into the stomach without difficulty.  There was hiatal hernia and distal esophageal Schatzki's ring, estimated luminal diameter about 12mm. Few small food particles in esopphagus, but these will readily pass into the stomach over time.  Stomach had some fundic gland polyps. Stomach, pylorus and duodenum otherwise normal to the second portion.            The scope was then withdrawn from the patient and the procedure completed.  ENDOSCOPIC IMPRESSION:     Esophageal food impaction, resolved as above.  Underlying hiatal hernia and Schatzki's ring.  RECOMMENDATIONS:     1.  Watch for potential complications of procedure. 2.  Liquid and finely pureed food ONLY.  NO solid food.  NO vegetables, meats, breads. 3.  Ok to discharge from endoscopy unit back to nursing facility.  eSigned:  Willis Modena, MD 12/22/2012 4:51 PM   CC:

## 2012-12-22 NOTE — Assessment & Plan Note (Signed)
CBGs satisfactory, continue present medication

## 2012-12-22 NOTE — Assessment & Plan Note (Signed)
Long standing issues with anxiety, depression, unresolved grief. Have recommended counseling in additionto current medication. Family is in agreement, hopefully patient will accept as well.

## 2012-12-22 NOTE — Assessment & Plan Note (Signed)
Stable, continues to tolerate pureed diet

## 2012-12-22 NOTE — Assessment & Plan Note (Addendum)
Patient with appearance similar to esophageal impaction 08/2012. Discussed with Dr. Ewing Schlein, he recommends urgent evaluation by gastroenterologist in hospital. Dr.Outlaw is in Endoscopy suite at [facility], he will see her this afternoon. Will arrange transport to Endoscopy at [facility]. Discussed situation with patient's daughter, [name]. She will meet patient at endoscopy unit.

## 2012-12-22 NOTE — H&P (View-Only) (Signed)
Patient ID: Rachel Cooke, female   DOB: [birth year], 79 y.o.   MRN: [id number] [facility] [phone])  Code Status: DNR Contact Information   Name Relation Home Work [place] Daughter [phone]  [phone]       Chief Complaint  Patient presents with  . Possible esophageal obstruction    HPI: This is a 79 yo female resident of [facility], Skilled nursing section. I was called to her room urgently this afternoon due to s/s similar to previous episode of esophageal impaction. Patient ate breakfast (pureed diet) as usual this AM, though she reports "coffee didn't taste quite right". She did not feel well at lunch time, did not eat lunch. Was working with ST this afternoon, drank some water, had "belching' and regurgitation of the water. Has continued to have intermittent spitting up of clear mucus.    Allergies  Allergen Reactions  . Lactose Intolerance (Gi)    Medications Reviewed  DATA REVIEWED  Radiologic Exams:   Cardiovascular Exams:   Laboratory Studies  [facility] 08/21/2012: Glucose 101, BUN 5, creatinine 0.46, sodium 139, potassium 3.6. LFTs WNL. Albumin 3.0 GFR 88   TSH 2.48  09/06/2012 urinalysis. Large blood, 30 protein, positive nitrate, large leukocyte esterase, many bacteria  09/10/2102: WBC 5.2, Hgb 11.9, Hct 36.3, plt 199   Glu 121, BUN 9, Cr. .46, Na 141, K+ 3.7 Albumin 3.4   TC 162, TG 242, HDL 31, LDL 94   A1C 6.0  78/46/9629 urine culture:greater than 100,000 Klebsiella pneumoniae  11/27/2012 WBC 8.4, hemoglobin 13.8, hematocrit 41.6, platelets 176   Glucose 105, BUN 9, creatinine 0.59, sodium 142, potassium 3.4. Protein/LFTs WNL  11/29/2012 urine culture no growth     Review of Systems  DATA OBTAINED: from patient, nurse, SLP GENERAL: Feels "OK"    MOUTH/THROAT: No mouth or tooth pain     No sore throat     No difficulty  Swallowing - has regurgitation- see HPI RESPIRATORY: No cough,  wheezing, SOB CARDIAC: No chest pain, palpitations  No edema. GI: No abdominal pain  Mild nausea   Last BM yesterday GU: No dysuria, frequency or urgency  No change in urine volume or character   NEUROLOGIC: No dizziness, fainting, headache, No change in mental status.  PSYCHIATRIC: No increased anxiety, depression Sleeps well.  No behavior issue.    Physical Exam There were no vitals filed for this visit. There is no weight on file to calculate BMI.  GENERAL APPEARANCE: No acute distress, appropriately groomed, Overweight body habitus. Alert, pleasant, less conversant than usual . SKIN: No diaphoresis, rash,   HEAD: Normocephalic, atraumatic EYES: Conjunctiva/lids clear.  MOUTH/THROAT: Lips w/o lesions. Oral mucosa, tongue moist, w/o lesion. Oropharynx w/o redness or lesions. Spitting up clear mucus NECK: Supple, full ROM. No thyroid tenderness, enlargement or nodule LYMPHATICS: No head, neck or supraclavicular adenopathy RESPIRATORY: Breathing is even, unlabored. Lung sounds are clear and full.  CARDIOVASCULAR: Heart RRR. No murmur or extra heart sounds GASTROINTESTINAL: Abdomen is soft, non-tender, not distended w/ normal bowel sounds. No hepatic or splenic enlargement. No mass, ventral or inguinal hernia. NEUROLOGIC: Oriented to time, place, person.  PSYCHIATRIC: Mood and affect appropriate to situation  ASSESSMENT/PLAN  Esophageal dysmotility Patient with appearance similar to esophageal impaction 08/2012. Discussed with Dr. Ewing Schlein, he recommends urgent evaluation by gastroenterologist in hospital. Dr.Outlaw is in Endoscopy suite at [facility], he will see her this afternoon. Will arrange transport to Endoscopy at [facility]. Discussed situation with patient's  daughter, [name]. She will meet patient at endoscopy unit.    Follow up: As needed  Kay Ricciuti T.Eleora Sutherland, NP-C 12/22/2012

## 2012-12-23 ENCOUNTER — Encounter (HOSPITAL_COMMUNITY): Payer: Self-pay | Admitting: Gastroenterology

## 2012-12-23 DIAGNOSIS — M6281 Muscle weakness (generalized): Secondary | ICD-10-CM | POA: Diagnosis not present

## 2012-12-23 DIAGNOSIS — G8190 Hemiplegia, unspecified affecting unspecified side: Secondary | ICD-10-CM | POA: Diagnosis not present

## 2012-12-23 DIAGNOSIS — I69919 Unspecified symptoms and signs involving cognitive functions following unspecified cerebrovascular disease: Secondary | ICD-10-CM | POA: Diagnosis not present

## 2012-12-23 DIAGNOSIS — R609 Edema, unspecified: Secondary | ICD-10-CM | POA: Diagnosis not present

## 2012-12-23 DIAGNOSIS — R279 Unspecified lack of coordination: Secondary | ICD-10-CM | POA: Diagnosis not present

## 2012-12-23 DIAGNOSIS — I69959 Hemiplegia and hemiparesis following unspecified cerebrovascular disease affecting unspecified side: Secondary | ICD-10-CM | POA: Diagnosis not present

## 2012-12-24 DIAGNOSIS — R609 Edema, unspecified: Secondary | ICD-10-CM | POA: Diagnosis not present

## 2012-12-24 DIAGNOSIS — I69959 Hemiplegia and hemiparesis following unspecified cerebrovascular disease affecting unspecified side: Secondary | ICD-10-CM | POA: Diagnosis not present

## 2012-12-24 DIAGNOSIS — G8190 Hemiplegia, unspecified affecting unspecified side: Secondary | ICD-10-CM | POA: Diagnosis not present

## 2012-12-24 DIAGNOSIS — M6281 Muscle weakness (generalized): Secondary | ICD-10-CM | POA: Diagnosis not present

## 2012-12-24 DIAGNOSIS — R279 Unspecified lack of coordination: Secondary | ICD-10-CM | POA: Diagnosis not present

## 2012-12-24 DIAGNOSIS — I69919 Unspecified symptoms and signs involving cognitive functions following unspecified cerebrovascular disease: Secondary | ICD-10-CM | POA: Diagnosis not present

## 2012-12-25 ENCOUNTER — Non-Acute Institutional Stay (SKILLED_NURSING_FACILITY): Payer: Medicare Other | Admitting: Geriatric Medicine

## 2012-12-25 ENCOUNTER — Encounter: Payer: Self-pay | Admitting: Geriatric Medicine

## 2012-12-25 DIAGNOSIS — M6281 Muscle weakness (generalized): Secondary | ICD-10-CM | POA: Diagnosis not present

## 2012-12-25 DIAGNOSIS — I69959 Hemiplegia and hemiparesis following unspecified cerebrovascular disease affecting unspecified side: Secondary | ICD-10-CM | POA: Diagnosis not present

## 2012-12-25 DIAGNOSIS — H353190 Nonexudative age-related macular degeneration, unspecified eye, stage unspecified: Secondary | ICD-10-CM | POA: Diagnosis not present

## 2012-12-25 DIAGNOSIS — G8190 Hemiplegia, unspecified affecting unspecified side: Secondary | ICD-10-CM | POA: Diagnosis not present

## 2012-12-25 DIAGNOSIS — Z5189 Encounter for other specified aftercare: Secondary | ICD-10-CM

## 2012-12-25 DIAGNOSIS — R609 Edema, unspecified: Secondary | ICD-10-CM | POA: Diagnosis not present

## 2012-12-25 DIAGNOSIS — I69919 Unspecified symptoms and signs involving cognitive functions following unspecified cerebrovascular disease: Secondary | ICD-10-CM | POA: Diagnosis not present

## 2012-12-25 DIAGNOSIS — R279 Unspecified lack of coordination: Secondary | ICD-10-CM | POA: Diagnosis not present

## 2012-12-25 DIAGNOSIS — T18128D Food in esophagus causing other injury, subsequent encounter: Secondary | ICD-10-CM

## 2012-12-25 NOTE — Assessment & Plan Note (Signed)
Recurrent food impaction in esophagus. Unclear how this patient had solid food, possibly this bolus represents dried pureed meat? Nursing and dietary staff have been educated again the importance of adhering to diet order. Although this patient is disappointed about this diet, she does understand why it is necessary. Next followup scheduled with Dr. Ewing Schlein 01/16/2013

## 2012-12-25 NOTE — Progress Notes (Signed)
Patient ID: Rachel Cooke, female   DOB: 03/09/1919, 79 y.o.   MRN: [id number] [facility] [phone])  Code Status: DNR Contact Information   Name Relation Home Work [place] Daughter [phone]  [phone]       Chief Complaint  Patient presents with  . F/U esophageal impaction    HPI: This is a 79 yo female resident of [facility], Skilled nursing section evaluated today in followup of recurrent esophageal impaction.   Last visit:  Esophageal dysmotility Patient with appearance similar to esophageal impaction 08/2012. Discussed with Dr. Ewing Schlein, he recommends urgent evaluation by gastroenterologist in hospital. Dr.Outlaw is in Endoscopy suite at [facility], he will see her this afternoon. Will arrange transport to Endoscopy at [facility]. Discussed situation with patient's daughter, [name]. She will meet patient at endoscopy unit.  EGD performed by Dr.Outlaw on October 20 revealed a solid food bolus in the patient's mid esophagus. Portions of this were removed and the remainder was dislodged into the patient's stomach. She tolerated this procedure without complication. Diet instructions following this procedure are liquids only.  Patient reports she is feeling okay, is hungry. Admits feeling discouraged and disappointed that her diet is liquid only.   Is going to ophthalmology appointment today.   Allergies  Allergen Reactions  . Lactose Intolerance (Gi)    Medications Reviewed  DATA REVIEWED  Radiologic Exams:   Cardiovascular Exams:   Laboratory Studies  [facility] 08/21/2012: Glucose 101, BUN 5, creatinine 0.46, sodium 139, potassium 3.6. LFTs WNL. Albumin 3.0 GFR 88   TSH 2.48  09/06/2012 urinalysis. Large blood, 30 protein, positive nitrate, large leukocyte esterase, many bacteria  09/10/2102: WBC 5.2, Hgb 11.9, Hct 36.3, plt 199   Glu 121, BUN 9, Cr. .46, Na 141, K+ 3.7 Albumin 3.4   TC 162, TG 242, HDL 31, LDL 94   A1C  6.0  11/05/2012 urine culture:greater than 100,000 Klebsiella pneumoniae  11/27/2012 WBC 8.4, hemoglobin 13.8, hematocrit 41.6, platelets 176   Glucose 105, BUN 9, creatinine 0.59, sodium 142, potassium 3.4. Protein/LFTs WNL  11/29/2012 urine culture no growth     Review of Systems  DATA OBTAINED: from patient,  GENERAL: Feels "OK"    MOUTH/THROAT: No mouth or tooth pain     No sore throat     No difficulty  Swallowing RESPIRATORY: No cough, wheezing, SOB CARDIAC: No chest pain, palpitations  No edema. GI: No abdominal pain  No N/V/D   Last BM today GU: No dysuria, frequency or urgency  No change in urine volume or character   NEUROLOGIC: No dizziness, fainting, headache, No change in mental status.  PSYCHIATRIC: Discouraged today   Physical Exam Filed Vitals:   12/25/12 1758  BP: 133/61  Pulse: 78  Temp: 97.1 F (36.2 C)  Resp: 18  Weight: 154 lb (69.854 kg)  SpO2: 95%   Body mass index is 24.87 kg/(m^2).  GENERAL APPEARANCE: No acute distress, appropriately groomed, Overweight body habitus. Alert, pleasant, less conversant than usual . SKIN: No diaphoresis, rash,   HEAD: Normocephalic, atraumatic EYES: Conjunctiva/lids clear.  MOUTH/THROAT: Lips w/o lesions. Oral mucosa, tongue moist, w/o lesion. Oropharynx w/o redness or lesions.   NECK: Supple, full ROM. No thyroid tenderness, enlargement or nodule LYMPHATICS: No head, neck or supraclavicular adenopathy RESPIRATORY: Breathing is even, unlabored. Lung sounds are clear and full.  CARDIOVASCULAR: Heart RRR. No murmur or extra heart sounds GASTROINTESTINAL: Abdomen is soft, non-tender, not distended w/ normal bowel sounds. No hepatic or  splenic enlargement. No mass, ventral or inguinal hernia. NEUROLOGIC: Oriented to time, place, person.  PSYCHIATRIC: Mood and affect appropriate to situation  ASSESSMENT/PLAN  Food impaction of esophagus Recurrent food impaction in esophagus. Unclear how this patient had solid food,  possibly this bolus represents dried pureed meat? Nursing and dietary staff have been educated again the importance of adhering to diet order. Although this patient is disappointed about this diet, she does understand why it is necessary. Next followup scheduled with Dr. Ewing Schlein 01/16/2013   Follow up: As needed  Zaela Graley T.Keishawn Rajewski, NP-C 12/25/2012

## 2012-12-26 DIAGNOSIS — R609 Edema, unspecified: Secondary | ICD-10-CM | POA: Diagnosis not present

## 2012-12-26 DIAGNOSIS — I69959 Hemiplegia and hemiparesis following unspecified cerebrovascular disease affecting unspecified side: Secondary | ICD-10-CM | POA: Diagnosis not present

## 2012-12-26 DIAGNOSIS — R279 Unspecified lack of coordination: Secondary | ICD-10-CM | POA: Diagnosis not present

## 2012-12-26 DIAGNOSIS — M6281 Muscle weakness (generalized): Secondary | ICD-10-CM | POA: Diagnosis not present

## 2012-12-26 DIAGNOSIS — I69919 Unspecified symptoms and signs involving cognitive functions following unspecified cerebrovascular disease: Secondary | ICD-10-CM | POA: Diagnosis not present

## 2012-12-26 DIAGNOSIS — G8190 Hemiplegia, unspecified affecting unspecified side: Secondary | ICD-10-CM | POA: Diagnosis not present

## 2012-12-27 DIAGNOSIS — R279 Unspecified lack of coordination: Secondary | ICD-10-CM | POA: Diagnosis not present

## 2012-12-27 DIAGNOSIS — M6281 Muscle weakness (generalized): Secondary | ICD-10-CM | POA: Diagnosis not present

## 2012-12-27 DIAGNOSIS — G8190 Hemiplegia, unspecified affecting unspecified side: Secondary | ICD-10-CM | POA: Diagnosis not present

## 2012-12-27 DIAGNOSIS — R609 Edema, unspecified: Secondary | ICD-10-CM | POA: Diagnosis not present

## 2012-12-27 DIAGNOSIS — I69919 Unspecified symptoms and signs involving cognitive functions following unspecified cerebrovascular disease: Secondary | ICD-10-CM | POA: Diagnosis not present

## 2012-12-27 DIAGNOSIS — I69959 Hemiplegia and hemiparesis following unspecified cerebrovascular disease affecting unspecified side: Secondary | ICD-10-CM | POA: Diagnosis not present

## 2012-12-29 ENCOUNTER — Encounter: Payer: Self-pay | Admitting: Internal Medicine

## 2012-12-29 DIAGNOSIS — I69959 Hemiplegia and hemiparesis following unspecified cerebrovascular disease affecting unspecified side: Secondary | ICD-10-CM | POA: Diagnosis not present

## 2012-12-29 DIAGNOSIS — I69919 Unspecified symptoms and signs involving cognitive functions following unspecified cerebrovascular disease: Secondary | ICD-10-CM | POA: Diagnosis not present

## 2012-12-29 DIAGNOSIS — M6281 Muscle weakness (generalized): Secondary | ICD-10-CM | POA: Diagnosis not present

## 2012-12-29 DIAGNOSIS — R279 Unspecified lack of coordination: Secondary | ICD-10-CM | POA: Diagnosis not present

## 2012-12-29 DIAGNOSIS — G8190 Hemiplegia, unspecified affecting unspecified side: Secondary | ICD-10-CM | POA: Diagnosis not present

## 2012-12-29 DIAGNOSIS — R609 Edema, unspecified: Secondary | ICD-10-CM | POA: Diagnosis not present

## 2012-12-30 DIAGNOSIS — I69919 Unspecified symptoms and signs involving cognitive functions following unspecified cerebrovascular disease: Secondary | ICD-10-CM | POA: Diagnosis not present

## 2012-12-30 DIAGNOSIS — R609 Edema, unspecified: Secondary | ICD-10-CM | POA: Diagnosis not present

## 2012-12-30 DIAGNOSIS — M6281 Muscle weakness (generalized): Secondary | ICD-10-CM | POA: Diagnosis not present

## 2012-12-30 DIAGNOSIS — R279 Unspecified lack of coordination: Secondary | ICD-10-CM | POA: Diagnosis not present

## 2012-12-30 DIAGNOSIS — I69959 Hemiplegia and hemiparesis following unspecified cerebrovascular disease affecting unspecified side: Secondary | ICD-10-CM | POA: Diagnosis not present

## 2012-12-30 DIAGNOSIS — G8190 Hemiplegia, unspecified affecting unspecified side: Secondary | ICD-10-CM | POA: Diagnosis not present

## 2012-12-31 DIAGNOSIS — R279 Unspecified lack of coordination: Secondary | ICD-10-CM | POA: Diagnosis not present

## 2012-12-31 DIAGNOSIS — I69959 Hemiplegia and hemiparesis following unspecified cerebrovascular disease affecting unspecified side: Secondary | ICD-10-CM | POA: Diagnosis not present

## 2012-12-31 DIAGNOSIS — G8190 Hemiplegia, unspecified affecting unspecified side: Secondary | ICD-10-CM | POA: Diagnosis not present

## 2012-12-31 DIAGNOSIS — M6281 Muscle weakness (generalized): Secondary | ICD-10-CM | POA: Diagnosis not present

## 2012-12-31 DIAGNOSIS — I69919 Unspecified symptoms and signs involving cognitive functions following unspecified cerebrovascular disease: Secondary | ICD-10-CM | POA: Diagnosis not present

## 2012-12-31 DIAGNOSIS — R609 Edema, unspecified: Secondary | ICD-10-CM | POA: Diagnosis not present

## 2013-01-01 DIAGNOSIS — I69959 Hemiplegia and hemiparesis following unspecified cerebrovascular disease affecting unspecified side: Secondary | ICD-10-CM | POA: Diagnosis not present

## 2013-01-01 DIAGNOSIS — I69919 Unspecified symptoms and signs involving cognitive functions following unspecified cerebrovascular disease: Secondary | ICD-10-CM | POA: Diagnosis not present

## 2013-01-01 DIAGNOSIS — G8190 Hemiplegia, unspecified affecting unspecified side: Secondary | ICD-10-CM | POA: Diagnosis not present

## 2013-01-01 DIAGNOSIS — R609 Edema, unspecified: Secondary | ICD-10-CM | POA: Diagnosis not present

## 2013-01-01 DIAGNOSIS — R279 Unspecified lack of coordination: Secondary | ICD-10-CM | POA: Diagnosis not present

## 2013-01-01 DIAGNOSIS — M6281 Muscle weakness (generalized): Secondary | ICD-10-CM | POA: Diagnosis not present

## 2013-01-05 DIAGNOSIS — I69991 Dysphagia following unspecified cerebrovascular disease: Secondary | ICD-10-CM | POA: Diagnosis not present

## 2013-01-05 DIAGNOSIS — I69993 Ataxia following unspecified cerebrovascular disease: Secondary | ICD-10-CM | POA: Diagnosis not present

## 2013-01-05 DIAGNOSIS — R609 Edema, unspecified: Secondary | ICD-10-CM | POA: Diagnosis not present

## 2013-01-05 DIAGNOSIS — R269 Unspecified abnormalities of gait and mobility: Secondary | ICD-10-CM | POA: Diagnosis not present

## 2013-01-05 DIAGNOSIS — I69919 Unspecified symptoms and signs involving cognitive functions following unspecified cerebrovascular disease: Secondary | ICD-10-CM | POA: Diagnosis not present

## 2013-01-05 DIAGNOSIS — R279 Unspecified lack of coordination: Secondary | ICD-10-CM | POA: Diagnosis not present

## 2013-01-05 DIAGNOSIS — G8190 Hemiplegia, unspecified affecting unspecified side: Secondary | ICD-10-CM | POA: Diagnosis not present

## 2013-01-05 DIAGNOSIS — I69959 Hemiplegia and hemiparesis following unspecified cerebrovascular disease affecting unspecified side: Secondary | ICD-10-CM | POA: Diagnosis not present

## 2013-01-05 DIAGNOSIS — R1314 Dysphagia, pharyngoesophageal phase: Secondary | ICD-10-CM | POA: Diagnosis not present

## 2013-01-05 DIAGNOSIS — M6281 Muscle weakness (generalized): Secondary | ICD-10-CM | POA: Diagnosis not present

## 2013-01-06 DIAGNOSIS — R279 Unspecified lack of coordination: Secondary | ICD-10-CM | POA: Diagnosis not present

## 2013-01-06 DIAGNOSIS — G8190 Hemiplegia, unspecified affecting unspecified side: Secondary | ICD-10-CM | POA: Diagnosis not present

## 2013-01-06 DIAGNOSIS — R609 Edema, unspecified: Secondary | ICD-10-CM | POA: Diagnosis not present

## 2013-01-06 DIAGNOSIS — I69919 Unspecified symptoms and signs involving cognitive functions following unspecified cerebrovascular disease: Secondary | ICD-10-CM | POA: Diagnosis not present

## 2013-01-06 DIAGNOSIS — I69959 Hemiplegia and hemiparesis following unspecified cerebrovascular disease affecting unspecified side: Secondary | ICD-10-CM | POA: Diagnosis not present

## 2013-01-06 DIAGNOSIS — M6281 Muscle weakness (generalized): Secondary | ICD-10-CM | POA: Diagnosis not present

## 2013-01-07 DIAGNOSIS — M6281 Muscle weakness (generalized): Secondary | ICD-10-CM | POA: Diagnosis not present

## 2013-01-07 DIAGNOSIS — I69919 Unspecified symptoms and signs involving cognitive functions following unspecified cerebrovascular disease: Secondary | ICD-10-CM | POA: Diagnosis not present

## 2013-01-07 DIAGNOSIS — I69959 Hemiplegia and hemiparesis following unspecified cerebrovascular disease affecting unspecified side: Secondary | ICD-10-CM | POA: Diagnosis not present

## 2013-01-07 DIAGNOSIS — G8190 Hemiplegia, unspecified affecting unspecified side: Secondary | ICD-10-CM | POA: Diagnosis not present

## 2013-01-07 DIAGNOSIS — R609 Edema, unspecified: Secondary | ICD-10-CM | POA: Diagnosis not present

## 2013-01-07 DIAGNOSIS — R279 Unspecified lack of coordination: Secondary | ICD-10-CM | POA: Diagnosis not present

## 2013-01-08 DIAGNOSIS — I69959 Hemiplegia and hemiparesis following unspecified cerebrovascular disease affecting unspecified side: Secondary | ICD-10-CM | POA: Diagnosis not present

## 2013-01-08 DIAGNOSIS — G8190 Hemiplegia, unspecified affecting unspecified side: Secondary | ICD-10-CM | POA: Diagnosis not present

## 2013-01-08 DIAGNOSIS — M6281 Muscle weakness (generalized): Secondary | ICD-10-CM | POA: Diagnosis not present

## 2013-01-08 DIAGNOSIS — R609 Edema, unspecified: Secondary | ICD-10-CM | POA: Diagnosis not present

## 2013-01-08 DIAGNOSIS — R279 Unspecified lack of coordination: Secondary | ICD-10-CM | POA: Diagnosis not present

## 2013-01-08 DIAGNOSIS — I69919 Unspecified symptoms and signs involving cognitive functions following unspecified cerebrovascular disease: Secondary | ICD-10-CM | POA: Diagnosis not present

## 2013-01-09 DIAGNOSIS — G8190 Hemiplegia, unspecified affecting unspecified side: Secondary | ICD-10-CM | POA: Diagnosis not present

## 2013-01-09 DIAGNOSIS — I69919 Unspecified symptoms and signs involving cognitive functions following unspecified cerebrovascular disease: Secondary | ICD-10-CM | POA: Diagnosis not present

## 2013-01-09 DIAGNOSIS — R279 Unspecified lack of coordination: Secondary | ICD-10-CM | POA: Diagnosis not present

## 2013-01-09 DIAGNOSIS — R609 Edema, unspecified: Secondary | ICD-10-CM | POA: Diagnosis not present

## 2013-01-09 DIAGNOSIS — I69959 Hemiplegia and hemiparesis following unspecified cerebrovascular disease affecting unspecified side: Secondary | ICD-10-CM | POA: Diagnosis not present

## 2013-01-09 DIAGNOSIS — M6281 Muscle weakness (generalized): Secondary | ICD-10-CM | POA: Diagnosis not present

## 2013-01-12 DIAGNOSIS — R609 Edema, unspecified: Secondary | ICD-10-CM | POA: Diagnosis not present

## 2013-01-12 DIAGNOSIS — I69959 Hemiplegia and hemiparesis following unspecified cerebrovascular disease affecting unspecified side: Secondary | ICD-10-CM | POA: Diagnosis not present

## 2013-01-12 DIAGNOSIS — M6281 Muscle weakness (generalized): Secondary | ICD-10-CM | POA: Diagnosis not present

## 2013-01-12 DIAGNOSIS — R279 Unspecified lack of coordination: Secondary | ICD-10-CM | POA: Diagnosis not present

## 2013-01-12 DIAGNOSIS — I69919 Unspecified symptoms and signs involving cognitive functions following unspecified cerebrovascular disease: Secondary | ICD-10-CM | POA: Diagnosis not present

## 2013-01-12 DIAGNOSIS — G8190 Hemiplegia, unspecified affecting unspecified side: Secondary | ICD-10-CM | POA: Diagnosis not present

## 2013-01-14 DIAGNOSIS — R609 Edema, unspecified: Secondary | ICD-10-CM | POA: Diagnosis not present

## 2013-01-14 DIAGNOSIS — I69919 Unspecified symptoms and signs involving cognitive functions following unspecified cerebrovascular disease: Secondary | ICD-10-CM | POA: Diagnosis not present

## 2013-01-14 DIAGNOSIS — G8190 Hemiplegia, unspecified affecting unspecified side: Secondary | ICD-10-CM | POA: Diagnosis not present

## 2013-01-14 DIAGNOSIS — I69959 Hemiplegia and hemiparesis following unspecified cerebrovascular disease affecting unspecified side: Secondary | ICD-10-CM | POA: Diagnosis not present

## 2013-01-14 DIAGNOSIS — R279 Unspecified lack of coordination: Secondary | ICD-10-CM | POA: Diagnosis not present

## 2013-01-14 DIAGNOSIS — M6281 Muscle weakness (generalized): Secondary | ICD-10-CM | POA: Diagnosis not present

## 2013-01-15 ENCOUNTER — Encounter (INDEPENDENT_AMBULATORY_CARE_PROVIDER_SITE_OTHER): Payer: Self-pay

## 2013-01-15 ENCOUNTER — Encounter: Payer: Self-pay | Admitting: Nurse Practitioner

## 2013-01-15 ENCOUNTER — Ambulatory Visit (INDEPENDENT_AMBULATORY_CARE_PROVIDER_SITE_OTHER): Payer: Medicare Other | Admitting: Nurse Practitioner

## 2013-01-15 VITALS — BP 109/64 | HR 86

## 2013-01-15 DIAGNOSIS — R279 Unspecified lack of coordination: Secondary | ICD-10-CM | POA: Diagnosis not present

## 2013-01-15 DIAGNOSIS — M6281 Muscle weakness (generalized): Secondary | ICD-10-CM | POA: Diagnosis not present

## 2013-01-15 DIAGNOSIS — G8190 Hemiplegia, unspecified affecting unspecified side: Secondary | ICD-10-CM | POA: Diagnosis not present

## 2013-01-15 DIAGNOSIS — R413 Other amnesia: Secondary | ICD-10-CM

## 2013-01-15 DIAGNOSIS — I69919 Unspecified symptoms and signs involving cognitive functions following unspecified cerebrovascular disease: Secondary | ICD-10-CM | POA: Diagnosis not present

## 2013-01-15 DIAGNOSIS — I69359 Hemiplegia and hemiparesis following cerebral infarction affecting unspecified side: Secondary | ICD-10-CM

## 2013-01-15 DIAGNOSIS — I69959 Hemiplegia and hemiparesis following unspecified cerebrovascular disease affecting unspecified side: Secondary | ICD-10-CM

## 2013-01-15 DIAGNOSIS — R609 Edema, unspecified: Secondary | ICD-10-CM | POA: Diagnosis not present

## 2013-01-15 NOTE — Patient Instructions (Signed)
Ms. Rachel Cooke is a 79 y.o. female with left parietal corona radiata/centrum semiovale infarct. Infarct etiology likley small vessel disease. Patient with resultant right hemiparesis.   Continue clopidogrel 75 mg orally every day for secondary stroke prevention and maintain strict control of hypertension with blood pressure goal below 140/90, diabetes with hemoglobin A1c goal below 6.5% and lipids with LDL cholesterol goal below 100 mg/dL.  Followup in 6 months, with Dr. Pearlean Brownie

## 2013-01-15 NOTE — Progress Notes (Signed)
PATIENT: Rachel Cooke DOB: 07/15/1919   REASON FOR VISIT: follow up HISTORY FROM: patient  HISTORY OF PRESENT ILLNESS: Rachel Cooke is an 79 y.o. female with known hypertension, dyslipidemia, status post permanent pacemaker placement who was brought to ED on 06-28-12 due to AMS. In the chart primary team ahd spoken with family and most of this history is obtained from the patient's daughter who was at bedside. "Per the history obtained, this morning patient was found on the floor and confused at her independent living facility. She was then brought here to the emergency room, where a CT of the head and neck were done which were negative for any significant abnormalities. A urinalysis was suggestive of a UTI. She was given Rocephin, and family has noted some improvement in her mentation-while she has been here. Daughter at bedside does indicate that the family does not desire any heroic measures, they do however desire IV antibiotics and treatment of potentially reversible conditions. At this point, the family clearly states that did not wish to pursue any aggressive intervention like cardiac cath or stress testing."   While in the hospital it was noted she was not moving her right arm as well as usual but unclear if it was her AMS or possible CVA. After her mentation started to clear it became obvious she was not moving her right side as well. repeat CT head showed a subtle hypodensity in the left parietal corona radiata/centrum semiovale appears more conspicuous than previous scan.   UPDATE 10/14/12 (LL): Patient comes to office for first office visit post-stroke. She has gone from living independently at [place] to skilled nursing at [facility] which has been a hard adjustment for her. She is working with PT and reports that she can stand and walk about 30 feet with a walker. She is not able to ambulate without assistance. She is a high fall risk. Her right side has been affected, which  was her dominant side. She had to be hospitalized again in June for esophageal impaction. Entire esophagus was impacted, per Dr. Ewing Schlein, was not related to stroke. She is taking Plavix for secondary stroke prevention. Patient denies medication side effects, with no signs of bleeding, but mild bruising. She has moderate depression at the loss of her independence, but is hopeful she will gain back some of her functional status.  UPDATE 01/15/13 (LL):  Patient comes to office for 2nd office visit post-stroke. She is living at [facility] skilled nursing.  She had a recurrent event of food impaction less than 3 weeks ago where she had to be transported to the endoscopy suite at [facility] for removal.  She has been on pureed diet since.  She has follow up with Dr. Ewing Schlein tomorrow.  Son-in-law states that PT has ended, patient was not making any more progress.  She is not able to stand or ambulate, must be lifted from bed to chair.  She has had a couple instances of UTI since last here.  Her mood is better; is still having a hard time with dealing with loss of independence.  Swelling in right arm and hand is improved, she can open and close hand and raise right arm 90 degrees. She is taking Plavix for secondary stroke prevention. Patient denies medication side effects, with no signs of bleeding or bruising.  REVIEW OF SYSTEMS: Full 14 system review of systems performed and notable only for:  ear/nose/throat: trouble swallowing  Gastrointestinal: incontinence  Allergy/immunology: frequent infections Neurological: confusion,  memory loss, headache, difficulty swallowing  psychiatric: depression, anxiety  ALLERGIES: Allergies  Allergen Reactions  . Lactose Intolerance (Gi)     HOME MEDICATIONS: Outpatient Prescriptions Prior to Visit  Medication Sig Dispense Refill  . acetaminophen (TYLENOL) 650 MG CR tablet Take 650 mg by mouth 2 (two) times daily.      . clopidogrel (PLAVIX) 75 MG tablet Take 75 mg by  mouth daily.      . diclofenac sodium (VOLTAREN) 1 % GEL Apply 2 g topically 3 (three) times daily.       Marland Kitchen escitalopram (LEXAPRO) 20 MG tablet Take 20 mg by mouth daily.      Marland Kitchen HYDROCORTISONE EX Apply topically as directed.      . hydroxypropyl methylcellulose (ISOPTO TEARS) 2.5 % ophthalmic solution 1 drop.      Marland Kitchen levothyroxine (SYNTHROID, LEVOTHROID) 75 MCG tablet Take 75 mcg by mouth daily before breakfast.      . liver oil-zinc oxide (DESITIN) 40 % ointment Apply 1 application topically as needed. Apply to rash on buttox/inner thighs after each BM      . losartan (COZAAR) 50 MG tablet Take 1 tablet (50 mg total) by mouth daily.      . Menthol-Zinc Oxide (CALMOSEPTINE) 0.44-20.625 % OINT Apply 1 application topically as needed. Apply to peri-anal skin and inner buttox after each incontinent episode and PRN      . metoprolol succinate (TOPROL-XL) 50 MG 24 hr tablet Take 50 mg by mouth daily. Take with or immediately following a meal.      . NON FORMULARY Biotene Spray      . omeprazole (PRILOSEC) 20 MG capsule Take 20 mg by mouth daily.       No facility-administered medications prior to visit.    PAST MEDICAL HISTORY: Past Medical History  Diagnosis Date  . Cancer   . Thyroid disease   . Hypertension   . High cholesterol   . Dendritic keratitis 2012  . Malignant neoplasm of breast (female), unspecified site 1970    S/P Rt radical mastectomy  . Unspecified hypothyroidism 2009  . Type II or unspecified type diabetes mellitus without mention of complication, not stated as uncontrolled 2003  . Unspecified vitamin D deficiency 2009  . Pure hyperglyceridemia 2004  . Anxiety state, unspecified   . Macular degeneration (senile) of retina, unspecified 2012  . Unspecified glaucoma(365.9) 2013  . Tinnitus 2007  . Unspecified essential hypertension 2000  . Coronary atherosclerosis of native coronary artery 2003    non obstructive by Cath 2003  . Atrioventricular block, complete 2003     s/p PPM 2003, generator chnage 2011  . Allergic rhinitis due to pollen   . Acquired cyst of kidney 2002  . Osteoarthrosis, unspecified whether generalized or localized, unspecified site 2013  . Unspecified arthropathy, pelvic region and thigh 2010  . Senile osteoporosis 2003  . Abnormality of gait 2007  . Edema 2003  . Personal history of colonic polyps      s/p colonoscopy/polypectomy 2003  . Cardiac pacemaker in situ 2003  . CVA (cerebral infarction) 06/28/2012    rt hemiparesis, dysphagia  . NSTEMI (non-ST elevated myocardial infarction) 06/28/2012  . Urinary retention 07/07/2012  . Pacemaker   . Stroke 06/28/2012  . Food impaction of esophagus 08/15/2012  . Esophageal dysmotility 08/19/2012  . Acute sinus infection 11/25/2012  . Altered mental status 11/28/2012    PAST SURGICAL HISTORY: Past Surgical History  Procedure Laterality Date  . Mastectomy Right 1970  Cancer  . Insert / replace / remove pacemaker    . Esophagogastroduodenoscopy N/A 08/15/2012    Procedure: ESOPHAGOGASTRODUODENOSCOPY (EGD);  Surgeon: Petra Kuba, MD;  Location: [facility] ENDOSCOPY;  Service: Endoscopy;  Laterality: N/A;  . Esophagoscopy N/A 08/15/2012    Procedure: ESOPHAGOSCOPY;  Surgeon: Darletta Moll, MD;  Location: [facility] OR;  Service: ENT;  Laterality: N/A;  . Foreign body removal esophageal N/A 08/15/2012    Procedure: REMOVAL FOREIGN BODY ESOPHAGEAL;  Surgeon: Darletta Moll, MD;  Location[facility] OR;  Service: ENT;  Laterality: N/A;  . Esophagogastroduodenoscopy N/A 08/15/2012    Procedure: ESOPHAGOGASTRODUODENOSCOPY (EGD);  Surgeon: Petra Kuba, MD;  Location: [facility] OR;  Service: Endoscopy;  Laterality: N/A;  . Esophagogastroduodenoscopy N/A 10/03/2012    Procedure: ESOPHAGOGASTRODUODENOSCOPY (EGD);  Surgeon: Petra Kuba, MD;  Location: [facility] ENDOSCOPY;  Service: Endoscopy;  Laterality: N/A;  . Savory dilation N/A 10/03/2012    Procedure: SAVORY DILATION;  Surgeon: Petra Kuba, MD;  Location: [facility] ENDOSCOPY;  Service: Endoscopy;   Laterality: N/A;  . Botox injection N/A 10/03/2012    Procedure: BOTOX INJECTION;  Surgeon: Petra Kuba, MD;  Location: [facility] ENDOSCOPY;  Service: Endoscopy;  Laterality: N/A;  . Esophagogastroduodenoscopy N/A 12/22/2012    Procedure: ESOPHAGOGASTRODUODENOSCOPY (EGD);  Surgeon: Willis Modena, MD;  Location: [facility] ENDOSCOPY;  Service: Endoscopy;  Laterality: N/A;    FAMILY HISTORY: Family History  Problem Relation Age of Onset  . Hypertension Father     SOCIAL HISTORY: History   Social History  . Marital Status: Widowed    Spouse Name: N/A    Number of Children: 3  . Years of Education: 12th   Occupational History  . retired    Social History Main Topics  . Smoking status: Never Smoker   . Smokeless tobacco: Not on file  . Alcohol Use: No  . Drug Use: No  . Sexual Activity: No   Other Topics Concern  . Not on file   Social History Narrative  . No narrative on file     PHYSICAL EXAM  Filed Vitals:   01/15/13 1443  BP: 109/64  Pulse: 86   Cannot calculate BMI with a height equal to zero.  Mentation: Alert oriented to time, place, history taking, language fluent, and casual conversation  Elderly pleasant Caucasian lady not in distress. Awake and alert. Afebrile. Head is nontraumatic. Neck is supple without bruit. Hearing is normal. Cardiac exam no murmur or gallop. Lungs are clear to auscultation. Distal pulses are well felt.   Neurological Exam: Diminished attention, registration and recall. Speech is slightly nonfluent but no aphasia, dysarthria or apraxia. Follows two-step commands well. Extraocular movements are full range without nystagmus. Fundi were not visualized. Vision acuity seems adequate. Visual fields appear full. Face with mild right lower weakness. Tongue is midline. Palatal movements are normal. Motor system exam reveals dense right hemiplegia with 2/5 strength on the right with diminished tone. Normal antigravity strength on the left side. Gait was not  tested; patient in wheelchair. Diminished sensation on the right.  Musculoskeletal: Mild edema of right arm; elbow to hand. Ecchymosis and rubor of right wrist to distal joint of fingers (improved since last visit).  DIAGNOSTIC DATA (LABS, IMAGING, TESTING) - I reviewed patient records, labs, notes, testing and imaging myself where available.  Lab Results  Component Value Date   WBC 7.6 08/18/2012   HGB 12.0 08/18/2012   HCT 36.6 08/18/2012   MCV 95.1 08/18/2012   PLT 165 08/18/2012  Component Value Date/Time   NA 142 08/18/2012 0505   K 3.5 08/18/2012 0505   CL 105 08/18/2012 0505   CO2 31 08/18/2012 0505   GLUCOSE 124* 08/18/2012 0505   BUN 5* 08/18/2012 0505   CREATININE 0.50 08/18/2012 0505   CALCIUM 8.8 08/18/2012 0505   PROT 6.2 08/18/2012 0505   ALBUMIN 2.7* 08/18/2012 0505   AST 25 08/18/2012 0505   ALT 15 08/18/2012 0505   ALKPHOS 43 08/18/2012 0505   BILITOT 0.5 08/18/2012 0505   GFRNONAA 83* 08/18/2012 0505   GFRAA >90 08/18/2012 0505   Lab Results  Component Value Date   CHOL 174 07/01/2012   HDL 42 07/01/2012   LDLCALC 99 07/01/2012   TRIG 167* 07/01/2012   CHOLHDL 4.1 07/01/2012   Lab Results  Component Value Date   HGBA1C 6.2* 07/01/2012   No results found for this basename: [id number]   Lab Results  Component Value Date   TSH 0.706 08/16/2012     ASSESSMENT AND PLAN Rachel Cooke is a 79 y.o. female with left parietal corona radiata/centrum semiovale infarct. Infarct etiology likley small vessel disease. Patient with resultant right hemiparesis and short-term memory loss.   Continue clopidogrel 75 mg orally every day for secondary stroke prevention and maintain strict control of hypertension with blood pressure goal below 140/90, diabetes with hemoglobin A1c goal below 6.5% and lipids with LDL cholesterol goal below 100 mg/dL.  Followup in 6 months, to see Dr. Pearlean Brownie next time.  Ronal Fear, MSN, NP-C 01/15/2013, 5:05 PM [facility] [address], [address] [place], [place] [address] [phone]  Note: This document was prepared with digital dictation and possible smart phrase technology. Any transcriptional errors that result from this process are unintentional.

## 2013-01-16 DIAGNOSIS — G8190 Hemiplegia, unspecified affecting unspecified side: Secondary | ICD-10-CM | POA: Diagnosis not present

## 2013-01-16 DIAGNOSIS — I69919 Unspecified symptoms and signs involving cognitive functions following unspecified cerebrovascular disease: Secondary | ICD-10-CM | POA: Diagnosis not present

## 2013-01-16 DIAGNOSIS — R279 Unspecified lack of coordination: Secondary | ICD-10-CM | POA: Diagnosis not present

## 2013-01-16 DIAGNOSIS — M6281 Muscle weakness (generalized): Secondary | ICD-10-CM | POA: Diagnosis not present

## 2013-01-16 DIAGNOSIS — R609 Edema, unspecified: Secondary | ICD-10-CM | POA: Diagnosis not present

## 2013-01-16 DIAGNOSIS — I69959 Hemiplegia and hemiparesis following unspecified cerebrovascular disease affecting unspecified side: Secondary | ICD-10-CM | POA: Diagnosis not present

## 2013-01-19 DIAGNOSIS — R609 Edema, unspecified: Secondary | ICD-10-CM | POA: Diagnosis not present

## 2013-01-19 DIAGNOSIS — M6281 Muscle weakness (generalized): Secondary | ICD-10-CM | POA: Diagnosis not present

## 2013-01-19 DIAGNOSIS — G8190 Hemiplegia, unspecified affecting unspecified side: Secondary | ICD-10-CM | POA: Diagnosis not present

## 2013-01-19 DIAGNOSIS — I69959 Hemiplegia and hemiparesis following unspecified cerebrovascular disease affecting unspecified side: Secondary | ICD-10-CM | POA: Diagnosis not present

## 2013-01-19 DIAGNOSIS — R279 Unspecified lack of coordination: Secondary | ICD-10-CM | POA: Diagnosis not present

## 2013-01-19 DIAGNOSIS — I69919 Unspecified symptoms and signs involving cognitive functions following unspecified cerebrovascular disease: Secondary | ICD-10-CM | POA: Diagnosis not present

## 2013-01-20 DIAGNOSIS — R279 Unspecified lack of coordination: Secondary | ICD-10-CM | POA: Diagnosis not present

## 2013-01-20 DIAGNOSIS — G8190 Hemiplegia, unspecified affecting unspecified side: Secondary | ICD-10-CM | POA: Diagnosis not present

## 2013-01-20 DIAGNOSIS — I69919 Unspecified symptoms and signs involving cognitive functions following unspecified cerebrovascular disease: Secondary | ICD-10-CM | POA: Diagnosis not present

## 2013-01-20 DIAGNOSIS — I69959 Hemiplegia and hemiparesis following unspecified cerebrovascular disease affecting unspecified side: Secondary | ICD-10-CM | POA: Diagnosis not present

## 2013-01-20 DIAGNOSIS — M6281 Muscle weakness (generalized): Secondary | ICD-10-CM | POA: Diagnosis not present

## 2013-01-20 DIAGNOSIS — R609 Edema, unspecified: Secondary | ICD-10-CM | POA: Diagnosis not present

## 2013-01-21 DIAGNOSIS — I69919 Unspecified symptoms and signs involving cognitive functions following unspecified cerebrovascular disease: Secondary | ICD-10-CM | POA: Diagnosis not present

## 2013-01-21 DIAGNOSIS — M6281 Muscle weakness (generalized): Secondary | ICD-10-CM | POA: Diagnosis not present

## 2013-01-21 DIAGNOSIS — G8190 Hemiplegia, unspecified affecting unspecified side: Secondary | ICD-10-CM | POA: Diagnosis not present

## 2013-01-21 DIAGNOSIS — R609 Edema, unspecified: Secondary | ICD-10-CM | POA: Diagnosis not present

## 2013-01-21 DIAGNOSIS — R279 Unspecified lack of coordination: Secondary | ICD-10-CM | POA: Diagnosis not present

## 2013-01-21 DIAGNOSIS — I69959 Hemiplegia and hemiparesis following unspecified cerebrovascular disease affecting unspecified side: Secondary | ICD-10-CM | POA: Diagnosis not present

## 2013-01-23 DIAGNOSIS — R279 Unspecified lack of coordination: Secondary | ICD-10-CM | POA: Diagnosis not present

## 2013-01-23 DIAGNOSIS — I69959 Hemiplegia and hemiparesis following unspecified cerebrovascular disease affecting unspecified side: Secondary | ICD-10-CM | POA: Diagnosis not present

## 2013-01-23 DIAGNOSIS — I69919 Unspecified symptoms and signs involving cognitive functions following unspecified cerebrovascular disease: Secondary | ICD-10-CM | POA: Diagnosis not present

## 2013-01-23 DIAGNOSIS — G8190 Hemiplegia, unspecified affecting unspecified side: Secondary | ICD-10-CM | POA: Diagnosis not present

## 2013-01-23 DIAGNOSIS — R609 Edema, unspecified: Secondary | ICD-10-CM | POA: Diagnosis not present

## 2013-01-23 DIAGNOSIS — M6281 Muscle weakness (generalized): Secondary | ICD-10-CM | POA: Diagnosis not present

## 2013-01-28 DIAGNOSIS — I69919 Unspecified symptoms and signs involving cognitive functions following unspecified cerebrovascular disease: Secondary | ICD-10-CM | POA: Diagnosis not present

## 2013-01-28 DIAGNOSIS — M6281 Muscle weakness (generalized): Secondary | ICD-10-CM | POA: Diagnosis not present

## 2013-01-28 DIAGNOSIS — R609 Edema, unspecified: Secondary | ICD-10-CM | POA: Diagnosis not present

## 2013-01-28 DIAGNOSIS — G8190 Hemiplegia, unspecified affecting unspecified side: Secondary | ICD-10-CM | POA: Diagnosis not present

## 2013-01-28 DIAGNOSIS — R279 Unspecified lack of coordination: Secondary | ICD-10-CM | POA: Diagnosis not present

## 2013-01-28 DIAGNOSIS — I69959 Hemiplegia and hemiparesis following unspecified cerebrovascular disease affecting unspecified side: Secondary | ICD-10-CM | POA: Diagnosis not present

## 2013-03-03 ENCOUNTER — Non-Acute Institutional Stay (SKILLED_NURSING_FACILITY): Payer: Medicare Other | Admitting: Geriatric Medicine

## 2013-03-03 ENCOUNTER — Encounter: Payer: Self-pay | Admitting: Geriatric Medicine

## 2013-03-03 DIAGNOSIS — I69959 Hemiplegia and hemiparesis following unspecified cerebrovascular disease affecting unspecified side: Secondary | ICD-10-CM

## 2013-03-03 DIAGNOSIS — I69351 Hemiplegia and hemiparesis following cerebral infarction affecting right dominant side: Secondary | ICD-10-CM

## 2013-03-03 DIAGNOSIS — E119 Type 2 diabetes mellitus without complications: Secondary | ICD-10-CM | POA: Diagnosis not present

## 2013-03-03 DIAGNOSIS — I10 Essential (primary) hypertension: Secondary | ICD-10-CM

## 2013-03-03 DIAGNOSIS — K224 Dyskinesia of esophagus: Secondary | ICD-10-CM

## 2013-03-03 NOTE — Progress Notes (Signed)
Patient ID: BORGHILD THAKER, female   DOB: [birth year], 79 y.o.   MRN: [id number]  [facility] [phone])  Code Status: DNR Contact Information   Name Relation Home Work [place] Daughter [phone]  [phone]       Chief Complaint  Patient presents with  . Medical Managment of Chronic Issues    HPI: This is a 79 yo female resident of [facility], Skilled nursing section evaluated today in followup of chronic medical issues.   Last visit:  Food impaction of esophagus Recurrent food impaction in esophagus. Unclear how this patient had solid food, possibly this bolus represents dried pureed meat? Nursing and dietary staff have been educated again the importance of adhering to diet order. Although this patient is disappointed about this diet, she does understand why it is necessary. Next followup scheduled with Dr. Ewing Schlein 01/16/2013  Since last visit patient has continued. Diet, is tolerating all right though she complains it is boring. Weight has been stable. Patient has not had any neurologic changes, followed up as [facility] with [facility]. There is no domestic dictation recommendations she is to follow again in 6 months. Patient is continuing with daily exercise program including walking in the hall. She did have a near fall yesterday while walking there was no injury. Review of facility record shows patient's blood pressure has been satisfactory, blood sugars well controlled. Patient is a bit blue today tells me that her brother had a CVA November 2014, he is recovering and moving to a rehabilitation center. He is 3 years younger than Mrs. Zemaitis. She reports her older brother died in 2012-11-22.   Allergies  Allergen Reactions  . Lactose Intolerance (Gi)    Medications Reviewed  DATA REVIEWED  Radiologic Exams:   Cardiovascular Exams:   Laboratory Studies  [facility] 08/21/2012: Glucose 101, BUN 5,  creatinine 0.46, sodium 139, potassium 3.6. LFTs WNL. Albumin 3.0 GFR 88   TSH 2.48  09/06/2012 urinalysis. Large blood, 30 protein, positive nitrate, large leukocyte esterase, many bacteria  09/10/2102: WBC 5.2, Hgb 11.9, Hct 36.3, plt 199   Glu 121, BUN 9, Cr. .46, Na 141, K+ 3.7 Albumin 3.4   TC 162, TG 242, HDL 31, LDL 94   A1C 6.0  12/30/2534 urine culture:greater than 100,000 Klebsiella pneumoniae  11/27/2012 WBC 8.4, hemoglobin 13.8, hematocrit 41.6, platelets 176   Glucose 105, BUN 9, creatinine 0.59, sodium 142, potassium 3.4. Protein/LFTs WNL  11/29/2012 urine culture no growth     Review of Systems  DATA OBTAINED: from patient, nurse, medical record GENERAL: Feels well   No fevers, fatigue, change in appetite or weight SKIN: No itch, rash or open wounds EYES: No eye pain, dryness or itching  No change in vision EARS: No earache, change in hearing NOSE: No congestion, drainage or bleeding MOUTH/THROAT: No mouth or tooth pain  No sore throat    No difficulty chewing or swallowing (pureed diet) RESPIRATORY: No cough, wheezing, SOB CARDIAC: No chest pain, palpitations  No edema. GI: No abdominal pain  No N/V/D or constipation  No heartburn or reflux  GU: No dysuria, frequency or urgency  No change in urine volume or character      MUSCULOSKELETAL: No joint pain, swelling or stiffness  No back pain  No muscle ache, pain, weakness  Gait is unsteady, requires assistance NEUROLOGIC: No dizziness, fainting, headache,  No change in mental status.  PSYCHIATRIC: No feelings of anxiety, depression   Sleeps  well.  No behavior issue.    Physical Exam Filed Vitals:   03/03/13 1706  BP: 100/48  Pulse: 84  Temp: 97.2 F (36.2 C)  Resp: 18  Weight: 157 lb 3.2 oz (71.305 kg)  SpO2: 94%   Body mass index is 25.38 kg/(m^2).  GENERAL APPEARANCE: No acute distress, appropriately groomed, Overweight body habitus. Alert, pleasant, conversant . SKIN: No diaphoresis, rash,   HEAD:  Normocephalic, atraumatic EYES: Conjunctiva/lids clear.  MOUTH/THROAT: Lips w/o lesions. Oral mucosa, tongue moist, w/o lesion. Oropharynx w/o redness or lesions.   NECK: Supple, full ROM. No thyroid tenderness, enlargement or nodule LYMPHATICS: No head, neck or supraclavicular adenopathy RESPIRATORY: Breathing is even, unlabored. Lung sounds are clear and full.  CARDIOVASCULAR: Heart RRR. No murmur or extra heart sounds GASTROINTESTINAL: Abdomen is soft, non-tender, not distended w/ normal bowel sounds. No hepatic or splenic enlargement. No mass, ventral or inguinal hernia. NEUROLOGIC: Oriented to time, place, person. Right sided weakness (CVA) PSYCHIATRIC: Mood and affect appropriate to situation  ASSESSMENT/PLAN  HTN (hypertension) BP 100-143/48-78, continue current medications, update lab.  Esophageal dysmotility No sign of esophageal impaction, continue pureed diet.  Type II or unspecified type diabetes mellitus without mention of complication, not stated as uncontrolled Fasting CBGs 107-118, continue current medications, update lab  Hemiparesis affecting right side as late effect of cerebrovascular accident PT/OT interventions are complete, patient is participating with scheduled exercise program, is walking with assistance. Rt. Sided weakness persists, leg more than arm   Follow up: As needed  Lab A1C, CMP, lipid panel  Booker Bhatnagar T.Mikaylah Libbey, NP-C 03/03/2013

## 2013-03-09 NOTE — Assessment & Plan Note (Signed)
PT/OT interventions are complete, patient is participating with scheduled exercise program, is walking with assistance. Rt. Sided weakness persists, leg more than arm

## 2013-03-09 NOTE — Assessment & Plan Note (Signed)
BP 100-143/48-78, continue current medications, update lab.

## 2013-03-09 NOTE — Assessment & Plan Note (Signed)
Fasting CBGs 107-118, continue current medications, update lab

## 2013-03-09 NOTE — Assessment & Plan Note (Signed)
No sign of esophageal impaction, continue pureed diet.

## 2013-03-10 DIAGNOSIS — I10 Essential (primary) hypertension: Secondary | ICD-10-CM | POA: Diagnosis not present

## 2013-03-10 DIAGNOSIS — E119 Type 2 diabetes mellitus without complications: Secondary | ICD-10-CM | POA: Diagnosis not present

## 2013-03-10 DIAGNOSIS — E785 Hyperlipidemia, unspecified: Secondary | ICD-10-CM | POA: Diagnosis not present

## 2013-03-10 LAB — LIPID PANEL
Cholesterol: 171 mg/dL (ref 0–200)
HDL: 41 mg/dL (ref 35–70)
LDL CALC: 95 mg/dL
Triglycerides: 177 mg/dL — AB (ref 40–160)

## 2013-03-10 LAB — HEMOGLOBIN A1C: Hgb A1c MFr Bld: 6.1 % — AB (ref 4.0–6.0)

## 2013-03-10 LAB — BASIC METABOLIC PANEL
Creatinine: 0.5 mg/dL (ref 0.5–1.1)
Glucose: 88 mg/dL
Potassium: 3.8 mmol/L (ref 3.4–5.3)
SODIUM: 143 mmol/L (ref 137–147)

## 2013-03-17 ENCOUNTER — Encounter: Payer: Self-pay | Admitting: *Deleted

## 2013-04-15 ENCOUNTER — Ambulatory Visit (INDEPENDENT_AMBULATORY_CARE_PROVIDER_SITE_OTHER): Payer: Medicare Other | Admitting: *Deleted

## 2013-04-15 DIAGNOSIS — I442 Atrioventricular block, complete: Secondary | ICD-10-CM

## 2013-04-15 LAB — MDC_IDC_ENUM_SESS_TYPE_INCLINIC
Battery Impedance: 178 Ohm
Battery Voltage: 2.79 V
Brady Statistic AP VS Percent: 0 %
Brady Statistic AS VS Percent: 0 %
Lead Channel Pacing Threshold Amplitude: 0.75 V
Lead Channel Pacing Threshold Amplitude: 1.25 V
Lead Channel Pacing Threshold Pulse Width: 0.4 ms
Lead Channel Sensing Intrinsic Amplitude: 2.8 mV
Lead Channel Setting Pacing Amplitude: 2.25 V
Lead Channel Setting Pacing Pulse Width: 0.4 ms
Lead Channel Setting Sensing Sensitivity: 2.8 mV
MDC IDC MSMT BATTERY REMAINING LONGEVITY: 125 mo
MDC IDC MSMT LEADCHNL RA IMPEDANCE VALUE: 480 Ohm
MDC IDC MSMT LEADCHNL RA PACING THRESHOLD PULSEWIDTH: 0.4 ms
MDC IDC MSMT LEADCHNL RV IMPEDANCE VALUE: 470 Ohm
MDC IDC SESS DTM: 20150211124711
MDC IDC SET LEADCHNL RA PACING AMPLITUDE: 1.5 V
MDC IDC STAT BRADY AP VP PERCENT: 1 %
MDC IDC STAT BRADY AS VP PERCENT: 99 %

## 2013-04-15 NOTE — Progress Notes (Signed)
Pacemaker check in clinic. Normal device function. Thresholds, sensing, impedances consistent with previous measurements. Device programmed to maximize longevity. 1 mode switch---<0.1%. No high ventricular rates noted. Device programmed at appropriate safety margins. Histogram distribution appropriate for patient activity level. Device programmed to optimize intrinsic conduction. Estimated longevity 10.68yrs.   ROV w/ Dr. Caryl Comes in 73mo.

## 2013-04-16 ENCOUNTER — Encounter: Payer: Self-pay | Admitting: Geriatric Medicine

## 2013-04-16 ENCOUNTER — Non-Acute Institutional Stay (SKILLED_NURSING_FACILITY): Payer: Medicare Other | Admitting: Geriatric Medicine

## 2013-04-16 DIAGNOSIS — K224 Dyskinesia of esophagus: Secondary | ICD-10-CM | POA: Diagnosis not present

## 2013-04-16 DIAGNOSIS — I10 Essential (primary) hypertension: Secondary | ICD-10-CM | POA: Diagnosis not present

## 2013-04-16 DIAGNOSIS — E785 Hyperlipidemia, unspecified: Secondary | ICD-10-CM

## 2013-04-16 DIAGNOSIS — E119 Type 2 diabetes mellitus without complications: Secondary | ICD-10-CM | POA: Diagnosis not present

## 2013-04-16 NOTE — Assessment & Plan Note (Signed)
Continued pured diet, no sign of esophageal impaction.

## 2013-04-16 NOTE — Assessment & Plan Note (Signed)
Lipid levels satisfactory off medication. Continue to monitor at intervals due to diabetes.

## 2013-04-16 NOTE — Assessment & Plan Note (Signed)
Fasting CBGs 101-108 last 7 days, no medications currently. Recent A1c satisfactory. Reduced CBG monitoring to 3 times a week

## 2013-04-16 NOTE — Assessment & Plan Note (Signed)
Recent blood pressure readings range 116-125/5978, continue current medications, recent laboratory satisfactory

## 2013-04-16 NOTE — Progress Notes (Signed)
Patient ID: Rachel Cooke, female   DOB: 1919/03/29, 80 y.o.   MRN: [id number]  [facility] [phone])  Code Status: DNR  Contact Information   Name Relation Home Work [place] Daughter [phone]  [phone]       Chief Complaint  Patient presents with  . Medical Managment of Chronic Issues    HPI: This is a 80 yo female resident of [facility], Skilled nursing section evaluated today in followup of chronic medical issues.   Last visit:  HTN (hypertension) BP 100-143/48-78, continue current medications, update lab.  Esophageal dysmotility No sign of esophageal impaction, continue pureed diet.  Type II or unspecified type diabetes mellitus without mention of complication, not stated as uncontrolled Fasting CBGs 107-118, continue current medications, update lab  Hemiparesis affecting right side as late effect of cerebrovascular accident PT/OT interventions are complete, patient is participating with scheduled exercise program, is walking with assistance. Rt. Sided weakness persists, leg more than arm   Since last visit, patient does not have any acute medical issues. Reviewed facility record shows vital signs remained stable, weight has dropped a few pounds, p.o. intake is adequate. Continues with pured diet, patient tells me she has new flavors of food that is "pretty good". CBGs remain satisfactory, recent lab studies satisfactory as well. Patient is participating in a walking and exercise program 3 times a week.  Allergies  Allergen Reactions  . Lactose Intolerance (Gi)    MEDICATION - Reviewed  DATA REVIEWED  Radiologic Exams:   Cardiovascular Exams:   Laboratory Studies  [facility] 08/21/2012:  TSH 2.48  09/10/2102:  TC 162, TG 242, HDL 31, LDL 94   A1C 6.0  11/27/2012 WBC 8.4, hemoglobin 13.8, hematocrit 41.6, platelets 176   Glucose 105, BUN 9, creatinine 0.59, sodium 142, potassium 3.4.  Protein/LFTs WNL   Lab Results  Component Value Date   NA 143 03/10/2013   K 3.8 03/10/2013   GLU 88 03/10/2013   BUN 5* 08/18/2012   CREATININE 0.5 03/10/2013    Lab Results  Component Value Date   CHOL 171 03/10/2013   HDL 41 03/10/2013   LDLCALC 95 03/10/2013   TRIG 177* 03/10/2013   CHOLHDL 4.1 07/01/2012   Lab Results  Component Value Date   HGBA1C 6.1* 03/10/2013        REVIEW OF SYSTEMS DATA OBTAINED: from patient, nurse, medical record GENERAL: Feels well   No fevers, fatigue, change in appetite. Mild weight loss SKIN: No itch, rash or open wounds EYES: No eye pain, dryness or itching  No change in vision EARS: No earache, change in hearing, wears hearing aids NOSE: No congestion, drainage or bleeding MOUTH/THROAT: No mouth or tooth pain  No sore throat    No difficulty swallowing (pureed diet) RESPIRATORY: No cough, wheezing, SOB CARDIAC: No chest pain, palpitations  No edema. GI: No abdominal pain  No N/V/D or constipation  No heartburn or reflux  GU: No dysuria, frequency or urgency  No change in urine volume or character      MUSCULOSKELETAL: No joint pain, swelling or stiffness  No back pain  No muscle ache, pain, weakness  Gait is unsteady, requires assistance NEUROLOGIC: No dizziness, fainting, headache,  No change in mental status.  PSYCHIATRIC: No feelings of anxiety, depression   Sleeps well.  No behavior issue.    PHYSICAL EXAM  Filed Vitals:   04/16/13 1620  BP: 115/63  Pulse: 87  Temp: 96.9 F (  36.1 C)  Resp: 20  Weight: 153 lb 9.6 oz (69.673 kg)  SpO2: 97%   Body mass index is 24.8 kg/(m^2).  GENERAL APPEARANCE: No acute distress, appropriately groomed, Overweight body habitus. Alert, pleasant, conversant . SKIN: No diaphoresis, rash,   HEAD: Normocephalic, atraumatic EYES: Conjunctiva/lids clear.  MOUTH/THROAT: Lips w/o lesions. Oral mucosa, tongue moist, w/o lesion. Oropharynx w/o redness or lesions.   NECK: Supple, full ROM. No thyroid tenderness,  enlargement or nodule LYMPHATICS: No head, neck or supraclavicular adenopathy RESPIRATORY: Breathing is even, unlabored. Lung sounds are clear and full.  CARDIOVASCULAR: Heart RRR. No murmur or extra heart sounds GASTROINTESTINAL: Abdomen is soft, non-tender, not distended w/ normal bowel sounds. No hepatic or splenic enlargement. No mass, ventral or inguinal hernia. NEUROLOGIC: Oriented to time, place, person. Mild Right sided weakness (CVA) PSYCHIATRIC: Mood and affect appropriate to situation  ASSESSMENT/PLAN  HTN (hypertension) Recent blood pressure readings range 116-125/5978, continue current medications, recent laboratory satisfactory  Esophageal dysmotility Continued pured diet, no sign of esophageal impaction.  Type II or unspecified type diabetes mellitus without mention of complication, not stated as uncontrolled Fasting CBGs 101-108 last 7 days, no medications currently. Recent A1c satisfactory. Reduced CBG monitoring to 3 times a week  Dyslipidemia Lipid levels satisfactory off medication. Continue to monitor at intervals due to diabetes.   Follow up: Routine and as needed    Mardene Celeste, NP-C [facility] [phone]  04/16/2013

## 2013-04-23 NOTE — Telephone Encounter (Signed)
Pt came in for her visit closing encounter °

## 2013-05-06 NOTE — Telephone Encounter (Signed)
Pt came in closing encounter

## 2013-05-11 ENCOUNTER — Encounter: Payer: Self-pay | Admitting: *Deleted

## 2013-05-12 DIAGNOSIS — L851 Acquired keratosis [keratoderma] palmaris et plantaris: Secondary | ICD-10-CM | POA: Diagnosis not present

## 2013-05-12 DIAGNOSIS — E1159 Type 2 diabetes mellitus with other circulatory complications: Secondary | ICD-10-CM | POA: Diagnosis not present

## 2013-05-12 DIAGNOSIS — L608 Other nail disorders: Secondary | ICD-10-CM | POA: Diagnosis not present

## 2013-05-19 ENCOUNTER — Encounter: Payer: Self-pay | Admitting: Internal Medicine

## 2013-05-25 ENCOUNTER — Non-Acute Institutional Stay (SKILLED_NURSING_FACILITY): Payer: Medicare Other | Admitting: Geriatric Medicine

## 2013-05-25 ENCOUNTER — Encounter: Payer: Self-pay | Admitting: Geriatric Medicine

## 2013-05-25 DIAGNOSIS — E119 Type 2 diabetes mellitus without complications: Secondary | ICD-10-CM | POA: Diagnosis not present

## 2013-05-25 DIAGNOSIS — K224 Dyskinesia of esophagus: Secondary | ICD-10-CM

## 2013-05-25 DIAGNOSIS — I10 Essential (primary) hypertension: Secondary | ICD-10-CM

## 2013-05-25 NOTE — Assessment & Plan Note (Signed)
Recent range 107-121/57-66. Continue current medication

## 2013-05-25 NOTE — Progress Notes (Signed)
Patient ID: Rachel Cooke, female   DOB: 01-27-1920, 80 y.o.   MRN: [id number]  [facility] [phone])  Code Status: DNR  Contact Information   Name Relation Home Work [place] Daughter [phone]  [phone]       Chief Complaint  Patient presents with  . Medical Managment of Chronic Issues    HPI: This is a 80 yo female resident of [facility], Skilled nursing section evaluated today in followup of chronic medical issues.   Last visit:  HTN (hypertension) Recent blood pressure readings range 116-125/5978, continue current medications, recent laboratory satisfactory  Esophageal dysmotility Continued pured diet, no sign of esophageal impaction.  Type II or unspecified type diabetes mellitus without mention of complication, not stated as uncontrolled Fasting CBGs 101-108 last 7 days, no medications currently. Recent A1c satisfactory. Reduced CBG monitoring to 3 times a week  Dyslipidemia Lipid levels satisfactory off medication. Continue to monitor at intervals due to diabetes.  Since last visit patient has not had any acute medical issues. Nurse reports patient has been c/o runny, drippy nose. Patient tells me that was last week, symptoms have stopped  In the last 2 days.  Review of facility record shows VS, weight and CBGs stable. Patient continues with limited diet, no sign of esophageal impaction.  Patient continues regular exercise with private caregiver to maintain gains made with PT/OT re: CVA.   Allergies  Allergen Reactions  . Lactose Intolerance (Gi)    MEDICATION - Reviewed  DATA REVIEWED  Radiologic Exams:   Cardiovascular Exams:   Laboratory Studies  [facility] 08/21/2012:  TSH 2.48  09/10/2102:  TC 162, TG 242, HDL 31, LDL 94   A1C 6.0  11/27/2012 WBC 8.4, hemoglobin 13.8, hematocrit 41.6, platelets 176   Glucose 105, BUN 9, creatinine 0.59, sodium 142, potassium 3.4. Protein/LFTs WNL    Lab Results  Component Value Date   NA 143 03/10/2013   K 3.8 03/10/2013   GLU 88 03/10/2013   BUN 5* 08/18/2012   CREATININE 0.5 03/10/2013    Lab Results  Component Value Date   CHOL 171 03/10/2013   HDL 41 03/10/2013   LDLCALC 95 03/10/2013   TRIG 177* 03/10/2013   CHOLHDL 4.1 07/01/2012   Lab Results  Component Value Date   HGBA1C 6.1* 03/10/2013        REVIEW OF SYSTEMS DATA OBTAINED: from patient, nurse, medical record GENERAL: Feels well   No fevers, fatigue, change in appetite. Mild weight loss SKIN: No itch, rash or open wounds EYES: No eye pain, dryness or itching  No change in vision EARS: No earache, change in hearing, wears hearing aids NOSE: No congestion, drainage or bleeding MOUTH/THROAT: No mouth or tooth pain  No sore throat    No difficulty swallowing (pureed diet) RESPIRATORY: No cough, wheezing, SOB CARDIAC: No chest pain, palpitations  No edema. GI: No abdominal pain  No N/V/D or constipation  No heartburn or reflux  GU: No dysuria, frequency or urgency  No change in urine volume or character      MUSCULOSKELETAL: No joint pain, swelling or stiffness  No back pain  No muscle ache, pain, weakness  Gait is unsteady, requires assistance NEUROLOGIC: No dizziness, fainting, headache,  No change in mental status.  PSYCHIATRIC: No feelings of anxiety, depression   Sleeps well.  No behavior issue.    PHYSICAL EXAM  Filed Vitals:   05/25/13 1221  BP: 107/66  Pulse: 83  Temp: 97.2 F (36.2 C)  Resp: 18  Weight: 154 lb 4.8 oz (69.99 kg)  SpO2: 92%   Body mass index is 24.92 kg/(m^2).  GENERAL APPEARANCE: No acute distress, appropriately groomed, Overweight body habitus. Alert, pleasant, conversant . SKIN: No diaphoresis, rash,   HEAD: Normocephalic, atraumatic EYES: Conjunctiva/lids clear.  MOUTH/THROAT: Lips w/o lesions. Oral mucosa, tongue moist, w/o lesion. Oropharynx w/o redness or lesions.   NECK: Supple, full ROM. No thyroid tenderness, enlargement or  nodule LYMPHATICS: No head, neck or supraclavicular adenopathy RESPIRATORY: Breathing is even, unlabored. Lung sounds are clear and full.  CARDIOVASCULAR: Heart RRR. No murmur or extra heart sounds GASTROINTESTINAL: Abdomen is soft, non-tender, not distended w/ normal bowel sounds. No hepatic or splenic enlargement. No mass, ventral or inguinal hernia. NEUROLOGIC: Oriented to time, place, person. Mild Right sided weakness (CVA) PSYCHIATRIC: Mood and affect appropriate to situation  ASSESSMENT/PLAN  HTN (hypertension) Recent range 107-121/57-66. Continue current medication  Esophageal dysmotility Continues to tolerate limited diet, weight stable, hydration status appears satisfactory.   Type II or unspecified type diabetes mellitus without mention of complication, not stated as uncontrolled Fasting CBG 102-111, no medication. Continue 3x/ week monitoring. Monitor A1C at intervals   Follow up: Routine and as needed    Mardene Celeste, NP-C [facility] [phone]  05/25/2013

## 2013-05-25 NOTE — Assessment & Plan Note (Signed)
Fasting CBG 102-111, no medication. Continue 3x/ week monitoring. Monitor A1C at intervals

## 2013-05-25 NOTE — Assessment & Plan Note (Signed)
Continues to tolerate limited diet, weight stable, hydration status appears satisfactory.

## 2013-06-04 ENCOUNTER — Encounter: Payer: Self-pay | Admitting: Interventional Cardiology

## 2013-06-15 ENCOUNTER — Encounter: Payer: Self-pay | Admitting: Geriatric Medicine

## 2013-06-25 DIAGNOSIS — H43819 Vitreous degeneration, unspecified eye: Secondary | ICD-10-CM | POA: Diagnosis not present

## 2013-06-25 DIAGNOSIS — H353190 Nonexudative age-related macular degeneration, unspecified eye, stage unspecified: Secondary | ICD-10-CM | POA: Diagnosis not present

## 2013-06-25 DIAGNOSIS — H04129 Dry eye syndrome of unspecified lacrimal gland: Secondary | ICD-10-CM | POA: Diagnosis not present

## 2013-07-01 DIAGNOSIS — G8190 Hemiplegia, unspecified affecting unspecified side: Secondary | ICD-10-CM | POA: Diagnosis not present

## 2013-07-01 DIAGNOSIS — M2550 Pain in unspecified joint: Secondary | ICD-10-CM | POA: Diagnosis not present

## 2013-07-01 DIAGNOSIS — M6281 Muscle weakness (generalized): Secondary | ICD-10-CM | POA: Diagnosis not present

## 2013-07-02 DIAGNOSIS — G8190 Hemiplegia, unspecified affecting unspecified side: Secondary | ICD-10-CM | POA: Diagnosis not present

## 2013-07-02 DIAGNOSIS — M2550 Pain in unspecified joint: Secondary | ICD-10-CM | POA: Diagnosis not present

## 2013-07-02 DIAGNOSIS — M6281 Muscle weakness (generalized): Secondary | ICD-10-CM | POA: Diagnosis not present

## 2013-07-03 ENCOUNTER — Non-Acute Institutional Stay (SKILLED_NURSING_FACILITY): Payer: Medicare Other | Admitting: Geriatric Medicine

## 2013-07-03 ENCOUNTER — Encounter: Payer: Self-pay | Admitting: Geriatric Medicine

## 2013-07-03 DIAGNOSIS — M25569 Pain in unspecified knee: Secondary | ICD-10-CM | POA: Diagnosis not present

## 2013-07-03 DIAGNOSIS — I10 Essential (primary) hypertension: Secondary | ICD-10-CM | POA: Diagnosis not present

## 2013-07-03 DIAGNOSIS — M2550 Pain in unspecified joint: Secondary | ICD-10-CM | POA: Diagnosis not present

## 2013-07-03 DIAGNOSIS — M1990 Unspecified osteoarthritis, unspecified site: Secondary | ICD-10-CM

## 2013-07-03 DIAGNOSIS — E1159 Type 2 diabetes mellitus with other circulatory complications: Secondary | ICD-10-CM | POA: Diagnosis not present

## 2013-07-03 DIAGNOSIS — G8190 Hemiplegia, unspecified affecting unspecified side: Secondary | ICD-10-CM | POA: Diagnosis not present

## 2013-07-03 DIAGNOSIS — M6281 Muscle weakness (generalized): Secondary | ICD-10-CM | POA: Diagnosis not present

## 2013-07-03 DIAGNOSIS — R279 Unspecified lack of coordination: Secondary | ICD-10-CM | POA: Diagnosis not present

## 2013-07-03 DIAGNOSIS — R269 Unspecified abnormalities of gait and mobility: Secondary | ICD-10-CM | POA: Diagnosis not present

## 2013-07-03 NOTE — Assessment & Plan Note (Signed)
BP range satisfactory. Continue current meds. Update labs at intervals.

## 2013-07-03 NOTE — Progress Notes (Signed)
Patient ID: Rachel Cooke, female   DOB: November 16, 1919, 80 y.o.   MRN: [id number]  [facility] [phone])  Code Status: DNR  Contact Information   Name Relation Home Work [place] Daughter [phone]  [phone]       Chief Complaint  Patient presents with  . Medical Management of Chronic Issues    HPI: This is a 80 yo female resident of [facility], Skilled nursing section evaluated today in followup of chronic medical issues.   Last visit:  HTN (hypertension) Recent range 107-121/57-66. Continue current medication  Esophageal dysmotility Continues to tolerate limited diet, weight stable, hydration status appears satisfactory.   Type II or unspecified type diabetes mellitus without mention of complication, not stated as uncontrolled Fasting CBG 102-111, no medication. Continue 3x/ week monitoring. Monitor A1C at intervals   Since the last visit she has complained of right leg weakness with buckling of the right knee and some associated knee pain. PT has been ordered. Review of facility record VS have been stable, weight stable, and CBGs satisfactory.  She was treated for a cold sore earlier this week and this has mostly resolved. She has been to the ophthalmologist for routine exam. She has had some right shoulder soreness and OT has been working with her.     Allergies  Allergen Reactions  . Lactose Intolerance (Gi)    MEDICATION - Reviewed  DATA REVIEWED  Radiologic Exams:   Cardiovascular Exams:   Laboratory Studies  [facility] 08/21/2012:  TSH 2.48  09/10/2102:  TC 162, TG 242, HDL 31, LDL 94   A1C 6.0  11/27/2012 WBC 8.4, hemoglobin 13.8, hematocrit 41.6, platelets 176   Glucose 105, BUN 9, creatinine 0.59, sodium 142, potassium 3.4. Protein/LFTs WNL   Lab Results  Component Value Date   NA 143 03/10/2013   K 3.8 03/10/2013   GLU 88 03/10/2013   BUN 5* 08/18/2012   CREATININE 0.5 03/10/2013    Lab  Results  Component Value Date   CHOL 171 03/10/2013   HDL 41 03/10/2013   LDLCALC 95 03/10/2013   TRIG 177* 03/10/2013   CHOLHDL 4.1 07/01/2012   Lab Results  Component Value Date   HGBA1C 6.1* 03/10/2013        REVIEW OF SYSTEMS DATA OBTAINED: from patient, nurse, medical record GENERAL: Feels well   No fevers, fatigue, change in appetite. Mild weight loss SKIN: No itch, rash or open wounds EYES: No eye pain, dryness or itching  No change in vision EARS: No earache, change in hearing, wears hearing aids NOSE: No congestion, drainage or bleeding MOUTH/THROAT: No mouth or tooth pain  No sore throat    No difficulty swallowing (pureed diet) RESPIRATORY: No cough, wheezing, SOB CARDIAC: No chest pain, palpitations  No edema. GI: No abdominal pain  No N/V/D or constipation  No heartburn or reflux  GU: No dysuria, frequency or urgency  No change in urine volume or character      MUSCULOSKELETAL: Right knee pain and right shoulder pain. Right leg weakness and knee buckling. Not walking.  No back pain  No muscle ache, pain, weakness   NEUROLOGIC: No dizziness, fainting, headache,  No change in mental status.  PSYCHIATRIC: No feelings of anxiety, depression   Sleeps well.  No behavior issue.    PHYSICAL EXAM  Filed Vitals:   07/03/13 1440  BP: 128/81  Pulse: 83  Temp: 97.5 F (36.4 C)  Resp: 20  Weight:  154 lb 12.8 oz (70.217 kg)  SpO2: 97%   Body mass index is 25 kg/(m^2).  GENERAL APPEARANCE: No acute distress, appropriately groomed, Overweight body habitus. Alert, pleasant, conversant . SKIN: No diaphoresis, rash,   HEAD: Normocephalic, atraumatic EYES: Conjunctiva/lids clear.  MOUTH/THROAT: Lips w/o lesions. Oral mucosa, tongue moist, w/o lesion. Oropharynx w/o redness or lesions.   NECK: Supple, full ROM. No thyroid tenderness, enlargement or nodule LYMPHATICS: No head, neck or supraclavicular adenopathy RESPIRATORY: Breathing is even, unlabored. Lung sounds are clear and full.    CARDIOVASCULAR: Heart RRR. No murmur or extra heart sounds GASTROINTESTINAL: Abdomen is soft, non-tender, not distended w/ normal bowel sounds. No hepatic or splenic enlargement. No mass, ventral or inguinal hernia. MUSCULOSKELETAL: Right knee with crepitus and mild edema. Strength 3/5 on the right and 5/5 on the left. Passive ROM discomfort with right outer hip. NEUROLOGIC: Oriented to time, place, person. Mild Right sided weakness (CVA) PSYCHIATRIC: Mood and affect appropriate to situation  ASSESSMENT/PLAN  HTN (hypertension) BP range satisfactory. Continue current meds. Update labs at intervals.  Type II or unspecified type diabetes mellitus with peripheral circulatory disorders, uncontrolled(250.72) CBGs satisfactory (93-104).  Check CBG three times weekly. No reports of hypoglycemia. Most recent A1C satisfactory. Repeat A1C in June.  Osteoarthrosis, unspecified whether generalized or localized, unspecified site She is having increased right knee pain and buckling with standing. No fall or injury. Obtain xray. Pt ordered.  Lab/tests: 2 view xray of the right knee. A1C, Lipid panel, and BMP in June.  Follow up:  Return for Routine/as needed.    Mardene Celeste, NP-C [place] [phone]  07/03/2013

## 2013-07-03 NOTE — Assessment & Plan Note (Signed)
She is having increased right knee pain and buckling with standing. No fall or injury. Obtain xray. Pt ordered.

## 2013-07-03 NOTE — Assessment & Plan Note (Signed)
CBGs satisfactory (93-104).  Check CBG three times weekly. No reports of hypoglycemia. Most recent A1C satisfactory. Repeat A1C in June.

## 2013-07-15 ENCOUNTER — Ambulatory Visit: Payer: Medicare Other | Admitting: Neurology

## 2013-08-03 DIAGNOSIS — G8190 Hemiplegia, unspecified affecting unspecified side: Secondary | ICD-10-CM | POA: Diagnosis not present

## 2013-08-03 DIAGNOSIS — M2550 Pain in unspecified joint: Secondary | ICD-10-CM | POA: Diagnosis not present

## 2013-08-03 DIAGNOSIS — M6281 Muscle weakness (generalized): Secondary | ICD-10-CM | POA: Diagnosis not present

## 2013-08-04 DIAGNOSIS — I10 Essential (primary) hypertension: Secondary | ICD-10-CM | POA: Diagnosis not present

## 2013-08-04 DIAGNOSIS — E785 Hyperlipidemia, unspecified: Secondary | ICD-10-CM | POA: Diagnosis not present

## 2013-08-04 DIAGNOSIS — E119 Type 2 diabetes mellitus without complications: Secondary | ICD-10-CM | POA: Diagnosis not present

## 2013-08-04 LAB — LIPID PANEL
CHOLESTEROL: 155 mg/dL (ref 0–200)
HDL: 38 mg/dL (ref 35–70)
LDL Cholesterol: 96 mg/dL
LDL/HDL RATIO: 2.5
TRIGLYCERIDES: 196 mg/dL — AB (ref 40–160)

## 2013-08-04 LAB — BASIC METABOLIC PANEL
BUN: 13 mg/dL (ref 4–21)
Creatinine: 0.6 mg/dL (ref 0.5–1.1)
Glucose: 89 mg/dL
Potassium: 4.4 mmol/L (ref 3.4–5.3)
Sodium: 142 mmol/L (ref 137–147)

## 2013-08-04 LAB — HEMOGLOBIN A1C: Hgb A1c MFr Bld: 6 % (ref 4.0–6.0)

## 2013-08-07 ENCOUNTER — Encounter: Payer: Self-pay | Admitting: Geriatric Medicine

## 2013-08-07 ENCOUNTER — Non-Acute Institutional Stay (SKILLED_NURSING_FACILITY): Payer: Medicare Other | Admitting: Geriatric Medicine

## 2013-08-07 DIAGNOSIS — I10 Essential (primary) hypertension: Secondary | ICD-10-CM

## 2013-08-07 DIAGNOSIS — E1159 Type 2 diabetes mellitus with other circulatory complications: Secondary | ICD-10-CM

## 2013-08-07 DIAGNOSIS — I69959 Hemiplegia and hemiparesis following unspecified cerebrovascular disease affecting unspecified side: Secondary | ICD-10-CM

## 2013-08-07 DIAGNOSIS — I69351 Hemiplegia and hemiparesis following cerebral infarction affecting right dominant side: Secondary | ICD-10-CM

## 2013-08-07 DIAGNOSIS — E039 Hypothyroidism, unspecified: Secondary | ICD-10-CM

## 2013-08-07 NOTE — Assessment & Plan Note (Signed)
The patient has not progressed with ambulation. Alternative mobility device is motorized wheelchair. Patient is currently undergoing training with this, making good progress. Patient's quality of life will be improved when she is more independent with mobility.

## 2013-08-07 NOTE — Assessment & Plan Note (Signed)
Fasting blood sugar range 97-107, recent A1c satisfactory. Most recent triglycerides mildly elevated. No medication change

## 2013-08-07 NOTE — Assessment & Plan Note (Signed)
Last TSH 08/2012, continues supplement, asymptomatic. Update lab

## 2013-08-07 NOTE — Assessment & Plan Note (Signed)
Weekly blood pressure range 117 134/7292, satisfactory. Continue current medication recent lab satisfactory

## 2013-08-07 NOTE — Progress Notes (Signed)
Patient ID: Rachel Cooke, female   DOB: June 21, 1919, 80 y.o.   MRN: [id number]  [facility] [phone])  Code Status: DNR  Contact Information   Name Relation Home Work [place] Daughter [phone]  [phone]       Chief Complaint  Patient presents with  . Medical Management of Chronic Issues    HPI: This is a 80 yo female resident of [place] [facility], Skilled nursing section evaluated today in followup of chronic medical issues.   Last visit:  HTN (hypertension) BP range satisfactory. Continue current meds. Update labs at intervals. Type II or unspecified type diabetes mellitus with peripheral circulatory disorders, uncontrolled(250.72) CBGs satisfactory (93-104).  Check CBG three times weekly. No reports of hypoglycemia. Most recent A1C satisfactory. Repeat A1C in June. Osteoarthrosis, unspecified whether generalized or localized, unspecified site She is having increased right knee pain and buckling with standing. No fall or injury. Obtain xray. Pt ordered.   Since last visit no acute issues. Knee x-ray negative for acute injury, ambulation remains very limited. OT has initiated training with motorized wheelchair! Review of facility record shows stable VS, CBGs, weight fluctuates throughout the month.   Allergies  Allergen Reactions  . Lactose Intolerance (Gi)    MEDICATION - Reviewed  DATA REVIEWED  Radiologic Exams  [facility] X-ray 07/04/2103 Rt. Knee, 3 views: Moderate to prominent degenerative arthritic changes primariloy involving medial compartment and patellofemoral joint  Cardiovascular Exams:   Laboratory Studies  [facility] 08/21/2012:  TSH 2.48  09/10/2102:  TC 162, TG 242, HDL 31, LDL 94   A1C 6.0  11/27/2012 WBC 8.4, hemoglobin 13.8, hematocrit 41.6, platelets 176   Glucose 105, BUN 9, creatinine 0.59, sodium 142, potassium 3.4. Protein/LFTs WNL   Lab Results  Component Value Date   NA  143 03/10/2013   K 3.8 03/10/2013   GLU 88 03/10/2013   BUN 5* 08/18/2012   CREATININE 0.5 03/10/2013    Lab Results  Component Value Date   NA 142 08/04/2013   K 4.4 08/04/2013   GLU 89 08/04/2013   BUN 13 08/04/2013   CREATININE 0.6 08/04/2013    Lab Results  Component Value Date   CHOL 155 08/04/2013   CHOL 171 03/10/2013   CHOL 174 07/01/2012   Lab Results  Component Value Date   HDL 38 08/04/2013   HDL 41 03/10/2013   HDL 42 07/01/2012   Lab Results  Component Value Date   LDLCALC 96 08/04/2013   LDLCALC 95 03/10/2013   LDLCALC 99 07/01/2012   Lab Results  Component Value Date   TRIG 196* 08/04/2013   TRIG 177* 03/10/2013   TRIG 167* 07/01/2012   Lab Results  Component Value Date   CHOLHDL 4.1 07/01/2012   Lab Results  Component Value Date   HGBA1C 6.0 08/04/2013   HGBA1C 6.1* 03/10/2013       REVIEW OF SYSTEMS DATA OBTAINED: from patient, nurse, medical record GENERAL: Feels well   No fevers, fatigue, change in appetite, weight fluctuates 153-159 last month SKIN: No itch, rash or open wounds EYES: No eye pain, dryness or itching  No change in vision EARS: No earache, change in hearing, wears hearing aids NOSE: No congestion, drainage or bleeding MOUTH/THROAT: No mouth or tooth pain  No sore throat    No difficulty swallowing (pureed diet) RESPIRATORY: No cough, wheezing, SOB CARDIAC: No chest pain, palpitations  No edema. GI: No abdominal pain  No N/V/D or  constipation  No heartburn or reflux  GU: No dysuria, frequency or urgency  No change in urine volume or character      MUSCULOSKELETAL: Right knee pain, leg weakness.  Walking only with therapy.  No back pain  No muscle ache, pain, weakness   NEUROLOGIC: No dizziness, fainting, headache,  No change in mental status.  PSYCHIATRIC: No feelings of anxiety, depression   Sleeps well.  No behavior issue.    PHYSICAL EXAM  Filed Vitals:   08/07/13 1139  BP: 102/63  Pulse: 81  Temp: 97.8 F (36.6 C)  Resp: 18  Weight: 157 lb (71.215  kg)  SpO2: 96%   Body mass index is 25.35 kg/(m^2).  GENERAL APPEARANCE: No acute distress, appropriately groomed, Overweight body habitus. Alert, pleasant, conversant . SKIN: No diaphoresis, rash,   HEAD: Normocephalic, atraumatic EYES: Conjunctiva/lids clear.  MOUTH/THROAT: Lips w/o lesions. Oral mucosa, tongue moist, w/o lesion. Oropharynx w/o redness or lesions.   NECK: Supple, full ROM. No thyroid tenderness, enlargement or nodule LYMPHATICS: No head, neck or supraclavicular adenopathy RESPIRATORY: Breathing is even, unlabored. Lung sounds are clear and full.  CARDIOVASCULAR: Heart RRR. No murmur or extra heart sounds GASTROINTESTINAL: Abdomen is soft, non-tender, not distended w/ normal bowel sounds. No hepatic or splenic enlargement. No mass, ventral or inguinal hernia. MUSCULOSKELETAL:  Strength 3/5 on the right and 5/5 on the left.  NEUROLOGIC: Oriented to time, place, person. Right sided weakness (CVA) PSYCHIATRIC: Mood and affect appropriate to situation  ASSESSMENT/PLAN  Hypothyroidism Last TSH 08/2012, continues supplement, asymptomatic. Update lab  HTN (hypertension) Weekly blood pressure range 117 134/7292, satisfactory. Continue current medication recent lab satisfactory  Type II or unspecified type diabetes mellitus with peripheral circulatory disorders, uncontrolled(250.72) Fasting blood sugar range 97-107, recent A1c satisfactory. Most recent triglycerides mildly elevated. No medication change  Hemiparesis affecting right side as late effect of cerebrovascular accident The patient has not progressed with ambulation. Alternative mobility device is motorized wheelchair. Patient is currently undergoing training with this, making good progress. Patient's quality of life will be improved when she is more independent with mobility.    Lab/tests: 08/11/13 TSH   Follow up:  No Follow-up on file.    Mardene Celeste, NP-C [place] [phone]  08/07/2013

## 2013-08-11 DIAGNOSIS — E039 Hypothyroidism, unspecified: Secondary | ICD-10-CM | POA: Diagnosis not present

## 2013-08-11 DIAGNOSIS — E1159 Type 2 diabetes mellitus with other circulatory complications: Secondary | ICD-10-CM | POA: Diagnosis not present

## 2013-08-11 DIAGNOSIS — L851 Acquired keratosis [keratoderma] palmaris et plantaris: Secondary | ICD-10-CM | POA: Diagnosis not present

## 2013-08-11 DIAGNOSIS — L608 Other nail disorders: Secondary | ICD-10-CM | POA: Diagnosis not present

## 2013-08-11 LAB — TSH: TSH: 0.37 u[IU]/mL — AB (ref 0.41–5.90)

## 2013-08-11 LAB — LIPID PANEL
CHOLESTEROL: 155 mg/dL (ref 0–200)
HDL: 38 mg/dL (ref 35–70)
LDL CALC: 96 mg/dL
TRIGLYCERIDES: 196 mg/dL — AB (ref 40–160)

## 2013-08-11 LAB — BASIC METABOLIC PANEL
BUN: 13 mg/dL (ref 4–21)
Creatinine: 0.6 mg/dL (ref 0.5–1.1)
Glucose: 89 mg/dL
Potassium: 4.4 mmol/L (ref 3.4–5.3)
Sodium: 142 mmol/L (ref 137–147)

## 2013-08-11 LAB — HEMOGLOBIN A1C: HEMOGLOBIN A1C: 6 % (ref 4.0–6.0)

## 2013-08-20 DIAGNOSIS — I214 Non-ST elevation (NSTEMI) myocardial infarction: Secondary | ICD-10-CM | POA: Diagnosis not present

## 2013-08-20 DIAGNOSIS — R4182 Altered mental status, unspecified: Secondary | ICD-10-CM | POA: Diagnosis not present

## 2013-08-20 DIAGNOSIS — I69991 Dysphagia following unspecified cerebrovascular disease: Secondary | ICD-10-CM | POA: Diagnosis not present

## 2013-08-20 DIAGNOSIS — E1159 Type 2 diabetes mellitus with other circulatory complications: Secondary | ICD-10-CM | POA: Diagnosis not present

## 2013-08-20 DIAGNOSIS — I69959 Hemiplegia and hemiparesis following unspecified cerebrovascular disease affecting unspecified side: Secondary | ICD-10-CM | POA: Diagnosis not present

## 2013-08-20 DIAGNOSIS — I442 Atrioventricular block, complete: Secondary | ICD-10-CM | POA: Diagnosis not present

## 2013-09-02 DIAGNOSIS — G8190 Hemiplegia, unspecified affecting unspecified side: Secondary | ICD-10-CM | POA: Diagnosis not present

## 2013-09-02 DIAGNOSIS — M2550 Pain in unspecified joint: Secondary | ICD-10-CM | POA: Diagnosis not present

## 2013-09-02 DIAGNOSIS — M6281 Muscle weakness (generalized): Secondary | ICD-10-CM | POA: Diagnosis not present

## 2013-09-03 DIAGNOSIS — M2550 Pain in unspecified joint: Secondary | ICD-10-CM | POA: Diagnosis not present

## 2013-09-03 DIAGNOSIS — G8190 Hemiplegia, unspecified affecting unspecified side: Secondary | ICD-10-CM | POA: Diagnosis not present

## 2013-09-03 DIAGNOSIS — M6281 Muscle weakness (generalized): Secondary | ICD-10-CM | POA: Diagnosis not present

## 2013-09-04 DIAGNOSIS — M2550 Pain in unspecified joint: Secondary | ICD-10-CM | POA: Diagnosis not present

## 2013-09-04 DIAGNOSIS — M6281 Muscle weakness (generalized): Secondary | ICD-10-CM | POA: Diagnosis not present

## 2013-09-04 DIAGNOSIS — G8190 Hemiplegia, unspecified affecting unspecified side: Secondary | ICD-10-CM | POA: Diagnosis not present

## 2013-09-07 DIAGNOSIS — M2550 Pain in unspecified joint: Secondary | ICD-10-CM | POA: Diagnosis not present

## 2013-09-07 DIAGNOSIS — G8190 Hemiplegia, unspecified affecting unspecified side: Secondary | ICD-10-CM | POA: Diagnosis not present

## 2013-09-07 DIAGNOSIS — M6281 Muscle weakness (generalized): Secondary | ICD-10-CM | POA: Diagnosis not present

## 2013-09-08 DIAGNOSIS — G8190 Hemiplegia, unspecified affecting unspecified side: Secondary | ICD-10-CM | POA: Diagnosis not present

## 2013-09-08 DIAGNOSIS — M6281 Muscle weakness (generalized): Secondary | ICD-10-CM | POA: Diagnosis not present

## 2013-09-08 DIAGNOSIS — M2550 Pain in unspecified joint: Secondary | ICD-10-CM | POA: Diagnosis not present

## 2013-09-09 DIAGNOSIS — G8190 Hemiplegia, unspecified affecting unspecified side: Secondary | ICD-10-CM | POA: Diagnosis not present

## 2013-09-09 DIAGNOSIS — M6281 Muscle weakness (generalized): Secondary | ICD-10-CM | POA: Diagnosis not present

## 2013-09-09 DIAGNOSIS — M2550 Pain in unspecified joint: Secondary | ICD-10-CM | POA: Diagnosis not present

## 2013-09-10 DIAGNOSIS — M6281 Muscle weakness (generalized): Secondary | ICD-10-CM | POA: Diagnosis not present

## 2013-09-10 DIAGNOSIS — M2550 Pain in unspecified joint: Secondary | ICD-10-CM | POA: Diagnosis not present

## 2013-09-10 DIAGNOSIS — G8190 Hemiplegia, unspecified affecting unspecified side: Secondary | ICD-10-CM | POA: Diagnosis not present

## 2013-09-11 DIAGNOSIS — M6281 Muscle weakness (generalized): Secondary | ICD-10-CM | POA: Diagnosis not present

## 2013-09-11 DIAGNOSIS — G8190 Hemiplegia, unspecified affecting unspecified side: Secondary | ICD-10-CM | POA: Diagnosis not present

## 2013-09-11 DIAGNOSIS — M2550 Pain in unspecified joint: Secondary | ICD-10-CM | POA: Diagnosis not present

## 2013-09-14 DIAGNOSIS — M6281 Muscle weakness (generalized): Secondary | ICD-10-CM | POA: Diagnosis not present

## 2013-09-14 DIAGNOSIS — G8190 Hemiplegia, unspecified affecting unspecified side: Secondary | ICD-10-CM | POA: Diagnosis not present

## 2013-09-14 DIAGNOSIS — M2550 Pain in unspecified joint: Secondary | ICD-10-CM | POA: Diagnosis not present

## 2013-09-15 DIAGNOSIS — M6281 Muscle weakness (generalized): Secondary | ICD-10-CM | POA: Diagnosis not present

## 2013-09-15 DIAGNOSIS — M2550 Pain in unspecified joint: Secondary | ICD-10-CM | POA: Diagnosis not present

## 2013-09-15 DIAGNOSIS — G8190 Hemiplegia, unspecified affecting unspecified side: Secondary | ICD-10-CM | POA: Diagnosis not present

## 2013-09-16 DIAGNOSIS — M2550 Pain in unspecified joint: Secondary | ICD-10-CM | POA: Diagnosis not present

## 2013-09-16 DIAGNOSIS — G8190 Hemiplegia, unspecified affecting unspecified side: Secondary | ICD-10-CM | POA: Diagnosis not present

## 2013-09-16 DIAGNOSIS — M6281 Muscle weakness (generalized): Secondary | ICD-10-CM | POA: Diagnosis not present

## 2013-09-17 DIAGNOSIS — M2550 Pain in unspecified joint: Secondary | ICD-10-CM | POA: Diagnosis not present

## 2013-09-17 DIAGNOSIS — G8190 Hemiplegia, unspecified affecting unspecified side: Secondary | ICD-10-CM | POA: Diagnosis not present

## 2013-09-17 DIAGNOSIS — M6281 Muscle weakness (generalized): Secondary | ICD-10-CM | POA: Diagnosis not present

## 2013-09-18 DIAGNOSIS — M2550 Pain in unspecified joint: Secondary | ICD-10-CM | POA: Diagnosis not present

## 2013-09-18 DIAGNOSIS — G8190 Hemiplegia, unspecified affecting unspecified side: Secondary | ICD-10-CM | POA: Diagnosis not present

## 2013-09-18 DIAGNOSIS — M6281 Muscle weakness (generalized): Secondary | ICD-10-CM | POA: Diagnosis not present

## 2013-09-22 DIAGNOSIS — M6281 Muscle weakness (generalized): Secondary | ICD-10-CM | POA: Diagnosis not present

## 2013-09-22 DIAGNOSIS — G8190 Hemiplegia, unspecified affecting unspecified side: Secondary | ICD-10-CM | POA: Diagnosis not present

## 2013-09-22 DIAGNOSIS — M2550 Pain in unspecified joint: Secondary | ICD-10-CM | POA: Diagnosis not present

## 2013-09-23 DIAGNOSIS — G8190 Hemiplegia, unspecified affecting unspecified side: Secondary | ICD-10-CM | POA: Diagnosis not present

## 2013-09-23 DIAGNOSIS — M6281 Muscle weakness (generalized): Secondary | ICD-10-CM | POA: Diagnosis not present

## 2013-09-23 DIAGNOSIS — M2550 Pain in unspecified joint: Secondary | ICD-10-CM | POA: Diagnosis not present

## 2013-09-24 DIAGNOSIS — M6281 Muscle weakness (generalized): Secondary | ICD-10-CM | POA: Diagnosis not present

## 2013-09-24 DIAGNOSIS — M2550 Pain in unspecified joint: Secondary | ICD-10-CM | POA: Diagnosis not present

## 2013-09-24 DIAGNOSIS — G8190 Hemiplegia, unspecified affecting unspecified side: Secondary | ICD-10-CM | POA: Diagnosis not present

## 2013-09-25 DIAGNOSIS — M2550 Pain in unspecified joint: Secondary | ICD-10-CM | POA: Diagnosis not present

## 2013-09-25 DIAGNOSIS — M6281 Muscle weakness (generalized): Secondary | ICD-10-CM | POA: Diagnosis not present

## 2013-09-25 DIAGNOSIS — G8190 Hemiplegia, unspecified affecting unspecified side: Secondary | ICD-10-CM | POA: Diagnosis not present

## 2013-09-28 DIAGNOSIS — M6281 Muscle weakness (generalized): Secondary | ICD-10-CM | POA: Diagnosis not present

## 2013-09-28 DIAGNOSIS — M2550 Pain in unspecified joint: Secondary | ICD-10-CM | POA: Diagnosis not present

## 2013-09-28 DIAGNOSIS — G8190 Hemiplegia, unspecified affecting unspecified side: Secondary | ICD-10-CM | POA: Diagnosis not present

## 2013-09-29 ENCOUNTER — Non-Acute Institutional Stay (SKILLED_NURSING_FACILITY): Payer: Medicare Other | Admitting: Nurse Practitioner

## 2013-09-29 DIAGNOSIS — I10 Essential (primary) hypertension: Secondary | ICD-10-CM

## 2013-09-29 DIAGNOSIS — E1159 Type 2 diabetes mellitus with other circulatory complications: Secondary | ICD-10-CM

## 2013-09-29 DIAGNOSIS — F329 Major depressive disorder, single episode, unspecified: Secondary | ICD-10-CM

## 2013-09-29 DIAGNOSIS — M2550 Pain in unspecified joint: Secondary | ICD-10-CM | POA: Diagnosis not present

## 2013-09-29 DIAGNOSIS — E038 Other specified hypothyroidism: Secondary | ICD-10-CM | POA: Diagnosis not present

## 2013-09-29 DIAGNOSIS — G8190 Hemiplegia, unspecified affecting unspecified side: Secondary | ICD-10-CM | POA: Diagnosis not present

## 2013-09-29 DIAGNOSIS — F3289 Other specified depressive episodes: Secondary | ICD-10-CM

## 2013-09-29 DIAGNOSIS — M6281 Muscle weakness (generalized): Secondary | ICD-10-CM | POA: Diagnosis not present

## 2013-09-29 DIAGNOSIS — F32A Depression, unspecified: Secondary | ICD-10-CM

## 2013-09-29 DIAGNOSIS — I69351 Hemiplegia and hemiparesis following cerebral infarction affecting right dominant side: Secondary | ICD-10-CM

## 2013-09-29 DIAGNOSIS — I69959 Hemiplegia and hemiparesis following unspecified cerebrovascular disease affecting unspecified side: Secondary | ICD-10-CM | POA: Diagnosis not present

## 2013-09-29 NOTE — Progress Notes (Signed)
Patient ID: Rachel Cooke, female   DOB: [birth year], 79 y.o.   MRN: [id number]    Patient ID: Rachel Cooke, female   DOB: 02-Aug-1919, 80 y.o.   MRN: [id number]  [facility] [phone])  Code Status: DNR  Contact Information   Name Relation Home Work [place] Daughter [phone]  [phone]       Chief Complaint  Patient presents with  . Medical Management of Chronic Issues    HPI: This is a 80 yo female resident of [place] [facility], Skilled nursing section evaluated today in followup of chronic medical issues. Pt with PMH of htn, DM, OA, hx of CVA with dysphagia and right sided weakness, dyslipidemia. Since last visit no acute issues. Staff reports the patients has been doing well. Feels like mood has improved since she has gotten her motorized wheelchair.   Allergies  Allergen Reactions  . Lactose Intolerance (Gi)    MEDICATION Current Outpatient Prescriptions on File Prior to Visit  Medication Sig Dispense Refill  . acetaminophen (TYLENOL) 650 MG CR tablet Take 650 mg by mouth 2 (two) times daily. For pain      . clopidogrel (PLAVIX) 75 MG tablet Take 75 mg by mouth daily. For stroke risk reduction      . diclofenac sodium (VOLTAREN) 1 % GEL Apply 2 g topically 3 (three) times daily. For pain( shoulder)      . escitalopram (LEXAPRO) 20 MG tablet Take 20 mg by mouth daily. For depression/ anxiety.      Marland Kitchen GLUCERNA (GLUCERNA) LIQD Take 237 mLs by mouth. At breakfast and lunch      . levothyroxine (SYNTHROID, LEVOTHROID) 75 MCG tablet Take 75 mcg by mouth daily before breakfast. For thyroid therapy      . liver oil-zinc oxide (DESITIN) 40 % ointment Apply 1 application topically as needed. Apply to rash on buttox/inner thighs after each BM for rash or irritation.      Marland Kitchen losartan (COZAAR) 50 MG tablet Take 50 mg by mouth daily. For HTN      . Menthol-Zinc Oxide (CALMOSEPTINE) 0.44-20.625 % OINT Apply 1 application topically as  needed. Apply to peri-anal skin and inner buttox after each incontinent episode and PRN for irritation. .      . metoprolol succinate (TOPROL-XL) 50 MG 24 hr tablet Take 50 mg by mouth daily. Take with or immediately following a meal. For HTN      . NON FORMULARY Biotene Spray one spray before meals and at bedtime      . omeprazole (PRILOSEC) 20 MG capsule Take 20 mg by mouth daily. For GERD      . Polyethyl Glycol-Propyl Glycol (SYSTANE) 0.4-0.3 % SOLN Apply to eye. One drop both eye every 2 hours as needed for dryness or irritation      . sodium chloride (OCEAN) 0.65 % nasal spray Place 1 spray into the nose as needed for congestion.       No current facility-administered medications on file prior to visit.     DATA REVIEWED  Radiologic Exams  [facility] X-ray 07/04/2103 Rt. Knee, 3 views: Moderate to prominent degenerative arthritic changes primariloy involving medial compartment and patellofemoral joint  Cardiovascular Exams:   Laboratory Studies  [facility] 08/21/2012:  TSH 2.48  09/10/2102:  TC 162, TG 242, HDL 31, LDL 94   A1C 6.0  11/27/2012 WBC 8.4, hemoglobin 13.8, hematocrit 41.6, platelets 176   Glucose 105, BUN 9, creatinine 0.59, sodium 142,  potassium 3.4. Protein/LFTs WNL   Labs reviewed: Basic Metabolic Panel:  Recent Labs  03/10/13 08/04/13 08/11/13  NA 143 142 142  K 3.8 4.4 4.4  BUN  --  13 13  CREATININE 0.5 0.6 0.6   CBG:  Recent Labs  10/03/12 1140  GLUCAP 94   TSH:  Recent Labs  08/11/13  TSH 0.37*   A1C: Lab Results  Component Value Date   HGBA1C 6.0 08/11/2013   Lipid Panel:  Recent Labs  03/10/13 08/04/13 08/11/13  CHOL 171 155 155  HDL 41 38 38  LDLCALC 95 96 96  TRIG 177* 196* 196*       REVIEW OF SYSTEMS DATA OBTAINED: from patient, nurse, medical record GENERAL: Feels well   No fevers, fatigue, change in appetite, SKIN: No itch, rash or open wounds EYES: No eye pain, dryness or itching  No change in vision EARS: No  earache, change in hearing, wears hearing aids NOSE: No congestion, drainage or bleeding MOUTH/THROAT: No mouth or tooth pain  No sore throat    No difficulty swallowing (ON BABY FOOD diet) RESPIRATORY: No cough, wheezing, SOB CARDIAC: No chest pain, palpitations  No edema. GI: No abdominal pain  No N/V/D or constipation  No heartburn or reflux  GU: No dysuria, frequency or urgency MUSCULOSKELETAL:  leg weakness.  Walking only with therapy.  No back pain  No muscle ache, pain, weakness   NEUROLOGIC: No dizziness, fainting, headache,  No change in mental status.  PSYCHIATRIC: No feelings of anxiety, depression   Sleeps well.  No behavior issue.    PHYSICAL EXAM  Filed Vitals:   09/29/13 1302  BP: 125/81  Pulse: 84  Temp: 97.9 F (36.6 C)  Resp: 18  Weight: 156 lb (70.761 kg)  SpO2: 95%   Body mass index is 25.19 kg/(m^2).  GENERAL APPEARANCE: No acute distress, appropriately groomed, Overweight body habitus. Alert, pleasant, conversant . SKIN: No diaphoresis, rash,   HEENT: unremarkable  RESPIRATORY: Breathing is even, unlabored. Lung sounds are clear and full.  CARDIOVASCULAR: Heart RRR. No murmur or extra heart sounds GASTROINTESTINAL: Abdomen is soft, non-tender, not distended w/ normal bowel sounds. No hepatic or splenic enlargement. No mass, ventral or inguinal hernia. MUSCULOSKELETAL:  Strength 3/5 on the right and 5/5 on the left.  NEUROLOGIC: Oriented to time, place, person. Right sided weakness (CVA) PSYCHIATRIC: Mood and affect appropriate to situation  ASSESSMENT/PLAN   1. Essential hypertension -blood pressure stable at this time, will cont current medications  2. Other specified hypothyroidism -TSH stable, will cont current synthroid dose  3. Hemiparesis affecting right side as late effect of cerebrovascular accident -conts to work with therapy, only walking with their assistance -conts on plavix, will follow up cbc   4. Type II or unspecified type diabetes  mellitus with peripheral circulatory disorders, uncontrolled(250.72) -A1c satisfactory   5. Depression Has been stable, conts lexapro   Labs- will follow up liver enzymes and cbc

## 2013-09-30 DIAGNOSIS — R1310 Dysphagia, unspecified: Secondary | ICD-10-CM | POA: Diagnosis not present

## 2013-10-01 DIAGNOSIS — M2550 Pain in unspecified joint: Secondary | ICD-10-CM | POA: Diagnosis not present

## 2013-10-01 DIAGNOSIS — M6281 Muscle weakness (generalized): Secondary | ICD-10-CM | POA: Diagnosis not present

## 2013-10-01 DIAGNOSIS — G8190 Hemiplegia, unspecified affecting unspecified side: Secondary | ICD-10-CM | POA: Diagnosis not present

## 2013-10-01 DIAGNOSIS — Z79899 Other long term (current) drug therapy: Secondary | ICD-10-CM | POA: Diagnosis not present

## 2013-10-01 DIAGNOSIS — I10 Essential (primary) hypertension: Secondary | ICD-10-CM | POA: Diagnosis not present

## 2013-10-01 LAB — CBC AND DIFFERENTIAL
HEMATOCRIT: 34 % — AB (ref 36–46)
Hemoglobin: 11.8 g/dL — AB (ref 12.0–16.0)
PLATELETS: 172 10*3/uL (ref 150–399)
WBC: 6 10^3/mL

## 2013-10-01 LAB — BASIC METABOLIC PANEL
BUN: 13 mg/dL (ref 4–21)
Creatinine: 0.5 mg/dL (ref 0.5–1.1)
GLUCOSE: 85 mg/dL
Potassium: 3.9 mmol/L (ref 3.4–5.3)
Sodium: 140 mmol/L (ref 137–147)

## 2013-10-01 LAB — HEPATIC FUNCTION PANEL
ALT: 13 U/L (ref 7–35)
AST: 21 U/L (ref 13–35)
Alkaline Phosphatase: 30 U/L (ref 25–125)

## 2013-10-05 DIAGNOSIS — G8190 Hemiplegia, unspecified affecting unspecified side: Secondary | ICD-10-CM | POA: Diagnosis not present

## 2013-10-05 DIAGNOSIS — M2550 Pain in unspecified joint: Secondary | ICD-10-CM | POA: Diagnosis not present

## 2013-10-05 DIAGNOSIS — R279 Unspecified lack of coordination: Secondary | ICD-10-CM | POA: Diagnosis not present

## 2013-10-05 DIAGNOSIS — M6281 Muscle weakness (generalized): Secondary | ICD-10-CM | POA: Diagnosis not present

## 2013-10-05 DIAGNOSIS — R269 Unspecified abnormalities of gait and mobility: Secondary | ICD-10-CM | POA: Diagnosis not present

## 2013-10-06 DIAGNOSIS — R269 Unspecified abnormalities of gait and mobility: Secondary | ICD-10-CM | POA: Diagnosis not present

## 2013-10-06 DIAGNOSIS — R279 Unspecified lack of coordination: Secondary | ICD-10-CM | POA: Diagnosis not present

## 2013-10-06 DIAGNOSIS — M6281 Muscle weakness (generalized): Secondary | ICD-10-CM | POA: Diagnosis not present

## 2013-10-06 DIAGNOSIS — G8190 Hemiplegia, unspecified affecting unspecified side: Secondary | ICD-10-CM | POA: Diagnosis not present

## 2013-10-06 DIAGNOSIS — M2550 Pain in unspecified joint: Secondary | ICD-10-CM | POA: Diagnosis not present

## 2013-10-08 DIAGNOSIS — R269 Unspecified abnormalities of gait and mobility: Secondary | ICD-10-CM | POA: Diagnosis not present

## 2013-10-08 DIAGNOSIS — R279 Unspecified lack of coordination: Secondary | ICD-10-CM | POA: Diagnosis not present

## 2013-10-08 DIAGNOSIS — G8190 Hemiplegia, unspecified affecting unspecified side: Secondary | ICD-10-CM | POA: Diagnosis not present

## 2013-10-08 DIAGNOSIS — M6281 Muscle weakness (generalized): Secondary | ICD-10-CM | POA: Diagnosis not present

## 2013-10-08 DIAGNOSIS — M2550 Pain in unspecified joint: Secondary | ICD-10-CM | POA: Diagnosis not present

## 2013-10-12 DIAGNOSIS — M2550 Pain in unspecified joint: Secondary | ICD-10-CM | POA: Diagnosis not present

## 2013-10-12 DIAGNOSIS — G8190 Hemiplegia, unspecified affecting unspecified side: Secondary | ICD-10-CM | POA: Diagnosis not present

## 2013-10-12 DIAGNOSIS — R279 Unspecified lack of coordination: Secondary | ICD-10-CM | POA: Diagnosis not present

## 2013-10-12 DIAGNOSIS — M6281 Muscle weakness (generalized): Secondary | ICD-10-CM | POA: Diagnosis not present

## 2013-10-12 DIAGNOSIS — R269 Unspecified abnormalities of gait and mobility: Secondary | ICD-10-CM | POA: Diagnosis not present

## 2013-10-13 DIAGNOSIS — R279 Unspecified lack of coordination: Secondary | ICD-10-CM | POA: Diagnosis not present

## 2013-10-13 DIAGNOSIS — G8190 Hemiplegia, unspecified affecting unspecified side: Secondary | ICD-10-CM | POA: Diagnosis not present

## 2013-10-13 DIAGNOSIS — M6281 Muscle weakness (generalized): Secondary | ICD-10-CM | POA: Diagnosis not present

## 2013-10-13 DIAGNOSIS — R269 Unspecified abnormalities of gait and mobility: Secondary | ICD-10-CM | POA: Diagnosis not present

## 2013-10-13 DIAGNOSIS — M2550 Pain in unspecified joint: Secondary | ICD-10-CM | POA: Diagnosis not present

## 2013-10-15 DIAGNOSIS — R269 Unspecified abnormalities of gait and mobility: Secondary | ICD-10-CM | POA: Diagnosis not present

## 2013-10-15 DIAGNOSIS — G8190 Hemiplegia, unspecified affecting unspecified side: Secondary | ICD-10-CM | POA: Diagnosis not present

## 2013-10-15 DIAGNOSIS — M2550 Pain in unspecified joint: Secondary | ICD-10-CM | POA: Diagnosis not present

## 2013-10-15 DIAGNOSIS — R279 Unspecified lack of coordination: Secondary | ICD-10-CM | POA: Diagnosis not present

## 2013-10-15 DIAGNOSIS — M6281 Muscle weakness (generalized): Secondary | ICD-10-CM | POA: Diagnosis not present

## 2013-10-16 DIAGNOSIS — R269 Unspecified abnormalities of gait and mobility: Secondary | ICD-10-CM | POA: Diagnosis not present

## 2013-10-16 DIAGNOSIS — R279 Unspecified lack of coordination: Secondary | ICD-10-CM | POA: Diagnosis not present

## 2013-10-16 DIAGNOSIS — M6281 Muscle weakness (generalized): Secondary | ICD-10-CM | POA: Diagnosis not present

## 2013-10-16 DIAGNOSIS — M2550 Pain in unspecified joint: Secondary | ICD-10-CM | POA: Diagnosis not present

## 2013-10-16 DIAGNOSIS — G8190 Hemiplegia, unspecified affecting unspecified side: Secondary | ICD-10-CM | POA: Diagnosis not present

## 2013-10-19 DIAGNOSIS — R279 Unspecified lack of coordination: Secondary | ICD-10-CM | POA: Diagnosis not present

## 2013-10-19 DIAGNOSIS — M2550 Pain in unspecified joint: Secondary | ICD-10-CM | POA: Diagnosis not present

## 2013-10-19 DIAGNOSIS — R269 Unspecified abnormalities of gait and mobility: Secondary | ICD-10-CM | POA: Diagnosis not present

## 2013-10-19 DIAGNOSIS — M6281 Muscle weakness (generalized): Secondary | ICD-10-CM | POA: Diagnosis not present

## 2013-10-19 DIAGNOSIS — G8190 Hemiplegia, unspecified affecting unspecified side: Secondary | ICD-10-CM | POA: Diagnosis not present

## 2013-10-20 DIAGNOSIS — M6281 Muscle weakness (generalized): Secondary | ICD-10-CM | POA: Diagnosis not present

## 2013-10-20 DIAGNOSIS — R269 Unspecified abnormalities of gait and mobility: Secondary | ICD-10-CM | POA: Diagnosis not present

## 2013-10-20 DIAGNOSIS — R279 Unspecified lack of coordination: Secondary | ICD-10-CM | POA: Diagnosis not present

## 2013-10-20 DIAGNOSIS — G8190 Hemiplegia, unspecified affecting unspecified side: Secondary | ICD-10-CM | POA: Diagnosis not present

## 2013-10-20 DIAGNOSIS — M2550 Pain in unspecified joint: Secondary | ICD-10-CM | POA: Diagnosis not present

## 2013-10-21 DIAGNOSIS — R279 Unspecified lack of coordination: Secondary | ICD-10-CM | POA: Diagnosis not present

## 2013-10-21 DIAGNOSIS — M6281 Muscle weakness (generalized): Secondary | ICD-10-CM | POA: Diagnosis not present

## 2013-10-21 DIAGNOSIS — R269 Unspecified abnormalities of gait and mobility: Secondary | ICD-10-CM | POA: Diagnosis not present

## 2013-10-21 DIAGNOSIS — M2550 Pain in unspecified joint: Secondary | ICD-10-CM | POA: Diagnosis not present

## 2013-10-21 DIAGNOSIS — G8190 Hemiplegia, unspecified affecting unspecified side: Secondary | ICD-10-CM | POA: Diagnosis not present

## 2013-10-22 DIAGNOSIS — R269 Unspecified abnormalities of gait and mobility: Secondary | ICD-10-CM | POA: Diagnosis not present

## 2013-10-22 DIAGNOSIS — R279 Unspecified lack of coordination: Secondary | ICD-10-CM | POA: Diagnosis not present

## 2013-10-22 DIAGNOSIS — M6281 Muscle weakness (generalized): Secondary | ICD-10-CM | POA: Diagnosis not present

## 2013-10-22 DIAGNOSIS — M2550 Pain in unspecified joint: Secondary | ICD-10-CM | POA: Diagnosis not present

## 2013-10-22 DIAGNOSIS — G8190 Hemiplegia, unspecified affecting unspecified side: Secondary | ICD-10-CM | POA: Diagnosis not present

## 2013-10-26 DIAGNOSIS — H40019 Open angle with borderline findings, low risk, unspecified eye: Secondary | ICD-10-CM | POA: Diagnosis not present

## 2013-10-26 DIAGNOSIS — H3530 Unspecified macular degeneration: Secondary | ICD-10-CM | POA: Diagnosis not present

## 2013-10-27 DIAGNOSIS — R269 Unspecified abnormalities of gait and mobility: Secondary | ICD-10-CM | POA: Diagnosis not present

## 2013-10-27 DIAGNOSIS — G8190 Hemiplegia, unspecified affecting unspecified side: Secondary | ICD-10-CM | POA: Diagnosis not present

## 2013-10-27 DIAGNOSIS — R279 Unspecified lack of coordination: Secondary | ICD-10-CM | POA: Diagnosis not present

## 2013-10-27 DIAGNOSIS — M2550 Pain in unspecified joint: Secondary | ICD-10-CM | POA: Diagnosis not present

## 2013-10-27 DIAGNOSIS — M6281 Muscle weakness (generalized): Secondary | ICD-10-CM | POA: Diagnosis not present

## 2013-10-28 DIAGNOSIS — R269 Unspecified abnormalities of gait and mobility: Secondary | ICD-10-CM | POA: Diagnosis not present

## 2013-10-28 DIAGNOSIS — M2550 Pain in unspecified joint: Secondary | ICD-10-CM | POA: Diagnosis not present

## 2013-10-28 DIAGNOSIS — R279 Unspecified lack of coordination: Secondary | ICD-10-CM | POA: Diagnosis not present

## 2013-10-28 DIAGNOSIS — M6281 Muscle weakness (generalized): Secondary | ICD-10-CM | POA: Diagnosis not present

## 2013-10-28 DIAGNOSIS — G8190 Hemiplegia, unspecified affecting unspecified side: Secondary | ICD-10-CM | POA: Diagnosis not present

## 2013-10-29 ENCOUNTER — Encounter: Payer: Self-pay | Admitting: Internal Medicine

## 2013-10-29 ENCOUNTER — Ambulatory Visit (INDEPENDENT_AMBULATORY_CARE_PROVIDER_SITE_OTHER): Payer: Medicare Other | Admitting: Internal Medicine

## 2013-10-29 VITALS — BP 136/64 | HR 81 | Ht 65.0 in

## 2013-10-29 DIAGNOSIS — I442 Atrioventricular block, complete: Secondary | ICD-10-CM

## 2013-10-29 LAB — MDC_IDC_ENUM_SESS_TYPE_INCLINIC
Battery Impedance: 248 Ohm
Battery Remaining Longevity: 114 mo
Battery Voltage: 2.79 V
Brady Statistic AP VS Percent: 0 %
Brady Statistic AS VP Percent: 99 %
Brady Statistic AS VS Percent: 0 %
Date Time Interrogation Session: 20150827152145
Lead Channel Impedance Value: 476 Ohm
Lead Channel Pacing Threshold Amplitude: 0.75 V
Lead Channel Pacing Threshold Amplitude: 1 V
Lead Channel Pacing Threshold Pulse Width: 0.4 ms
Lead Channel Pacing Threshold Pulse Width: 0.4 ms
Lead Channel Sensing Intrinsic Amplitude: 2 mV
Lead Channel Setting Pacing Amplitude: 2 V
Lead Channel Setting Pacing Pulse Width: 0.4 ms
MDC IDC MSMT LEADCHNL RA IMPEDANCE VALUE: 465 Ohm
MDC IDC SET LEADCHNL RV PACING AMPLITUDE: 2.5 V
MDC IDC SET LEADCHNL RV SENSING SENSITIVITY: 2.8 mV
MDC IDC STAT BRADY AP VP PERCENT: 1 %

## 2013-10-29 NOTE — Progress Notes (Signed)
Patient Care Team: Estill Dooms, MD as PCP - General (Internal Medicine) Mardene Celeste, NP as Nurse Practitioner (Geriatric Medicine) [facility] [name], MD as Attending Physician (Gastroenterology) Antony Contras, MD as Consulting Physician (Neurology)   HPI  Rachel Cooke is a 80 y.o. female Seen to establish pacemaker followup. She underwent generator replacement in 2011 with a prior implantation in 2003  She has had intercurrent stroke which has left her nonambulatory. There is no associated atrial fibrillation detected on her device  Echo 2012 demonstrated normal LV function and mild aortic stenosis   Past Medical History  Diagnosis Date  . Hypertension   . Dendritic keratitis 2012  . Malignant neoplasm of breast (female), unspecified site 1970    S/P Rt radical mastectomy  . Unspecified hypothyroidism 2009  . Type II or unspecified type diabetes mellitus without mention of complication, not stated as uncontrolled 2003  . Unspecified vitamin D deficiency 2009  . Pure hyperglyceridemia 2004  . Anxiety state, unspecified   . Macular degeneration (senile) of retina, unspecified 2012  . Unspecified glaucoma 2013  . Tinnitus 2007  . Coronary atherosclerosis of native coronary artery 2003    non obstructive by Cath 2003  . Atrioventricular block, complete 2003    s/p PPM 2003, generator chnage 2011  . Allergic rhinitis due to pollen   . Acquired cyst of kidney 2002  . Osteoarthrosis, unspecified whether generalized or localized, unspecified site 2013  . Unspecified arthropathy, pelvic region and thigh 2010  . Senile osteoporosis 2003  . Abnormality of gait 2007  . Edema 2003  . Personal history of colonic polyps      s/p colonoscopy/polypectomy 2003  . Cardiac pacemaker in situ 2003  . CVA (cerebral infarction) 06/28/2012    rt hemiparesis, dysphagia  . NSTEMI (non-ST elevated myocardial infarction) 06/28/2012  . Urinary  retention 07/07/2012  . Pacemaker   . Stroke 06/28/2012  . Food impaction of esophagus 08/15/2012  . Esophageal dysmotility 08/19/2012  . Acute sinus infection 11/25/2012  . Altered mental status 11/28/2012    Past Surgical History  Procedure Laterality Date  . Mastectomy Right 1970    Cancer  . Insert / replace / remove pacemaker    . Esophagogastroduodenoscopy N/A 08/15/2012    Procedure: ESOPHAGOGASTRODUODENOSCOPY (EGD);  Surgeon: [name], MD;  Location: [facility] ENDOSCOPY;  Service: Endoscopy;  Laterality: N/A;  . Esophagoscopy N/A 08/15/2012    Procedure: ESOPHAGOSCOPY;  Surgeon: [name], MD;  Location: [facility];  Service: ENT;  Laterality: N/A;  . Foreign body removal esophageal N/A 08/15/2012    Procedure: REMOVAL FOREIGN BODY ESOPHAGEAL;  Surgeon: [name], MD;  Location: [facility] OR;  Service: ENT;  Laterality: N/A;  . Esophagogastroduodenoscopy N/A 08/15/2012    Procedure: ESOPHAGOGASTRODUODENOSCOPY (EGD);  Surgeon: [name], MD;  Location: [place];  Service: Endoscopy;  Laterality: N/A;  . Esophagogastroduodenoscopy N/A 10/03/2012    Procedure: ESOPHAGOGASTRODUODENOSCOPY (EGD);  Surgeon: [name], MD;  Location: [facility];  Service: Endoscopy;  Laterality: N/A;  . Savory dilation N/A 10/03/2012    Procedure: SAVORY DILATION;  Surgeon: [name], MD;  Location: [facility] ENDOSCOPY;  Service: Endoscopy;  Laterality: N/A;  . Botox injection N/A 10/03/2012    Procedure: BOTOX INJECTION;  Surgeon: [name], MD;  Location: [facility] ENDOSCOPY;  Service: Endoscopy;  Laterality: N/A;  . Esophagogastroduodenoscopy N/A 12/22/2012    Procedure: ESOPHAGOGASTRODUODENOSCOPY (EGD);  Surgeon: [name], MD;  Location: [facility] ENDOSCOPY;  Service: Endoscopy;  Laterality: N/A;    Current Outpatient Prescriptions  Medication Sig Dispense Refill  . acetaminophen (TYLENOL) 650 MG CR tablet Take 650 mg by mouth 2 (two) times daily. For pain      . clopidogrel (PLAVIX) 75 MG tablet Take 75 mg by mouth daily. For  stroke risk reduction      . diclofenac sodium (VOLTAREN) 1 % GEL Apply 2 g topically 3 (three) times daily. For pain( shoulder)      . escitalopram (LEXAPRO) 20 MG tablet Take 20 mg by mouth daily. For depression/ anxiety.      Marland Kitchen GLUCERNA (GLUCERNA) LIQD Take 237 mLs by mouth. At breakfast and lunch      . levothyroxine (SYNTHROID, LEVOTHROID) 75 MCG tablet Take 75 mcg by mouth daily before breakfast. For thyroid therapy      . liver oil-zinc oxide (DESITIN) 40 % ointment Apply 1 application topically as needed. Apply to rash on buttox/inner thighs after each BM for rash or irritation.      Marland Kitchen losartan (COZAAR) 50 MG tablet Take 50 mg by mouth daily. For HTN      . Menthol-Zinc Oxide (CALMOSEPTINE) 0.44-20.625 % OINT Apply 1 application topically as needed. Apply to peri-anal skin and inner buttox after each incontinent episode and PRN for irritation. .      . metoprolol succinate (TOPROL-XL) 50 MG 24 hr tablet Take 50 mg by mouth daily. Take with or immediately following a meal. For HTN      . NON FORMULARY Biotene Spray one spray before meals and at bedtime      . omeprazole (PRILOSEC) 20 MG capsule Take 20 mg by mouth daily. For GERD      . OVER THE COUNTER MEDICATION Take 1 spray by mouth 2 (two) times daily. Biotin spray      . OVER THE COUNTER MEDICATION Take 1 tablet by mouth 2 (two) times daily.      Vladimir Faster Glycol-Propyl Glycol (SYSTANE) 0.4-0.3 % SOLN Apply 1 drop to eye 2 (two) times daily. One drop both eye every 2 hours as needed for dryness or irritation      . sodium chloride (OCEAN) 0.65 % nasal spray Place 1 spray into the nose 2 (two) times daily.        No current facility-administered medications for this visit.    Allergies  Allergen Reactions  . Lactose Intolerance (Gi)     Review of Systems negative except from HPI and PMH  Physical Exam BP 136/64  Pulse 81  Ht 5\' 5"  (1.651 m) Well developed and well nourished in no acute distress HENT normal E scleral and  icterus clear Neck Supple JVP flat; carotids brisk and full Clear to ausculation Device pocket well healed; without hematoma or erythema.  There is no tethering Regular rate and rhythm, no murmurs gallops or rub Soft with active bowel sounds No clubbing cyanosis right upper extremity Edema Alert and oriented, nonambulatory and wheelchair Skin Warm and Dry  ECG demonstrates P. synchronous pacing  Assessment and  Plan  Complete heart block  Pacemaker-[employer]  Stroke  Aortic stenosis-mild  Will arrange followup with Dr. Saundra Shelling with an echo.  Potential impact of either progressive aortic stenosis is hard to imagine given her nonambulatory condition  We'll continue her current medications and continue to survey for atrial fibrillation

## 2013-10-29 NOTE — Patient Instructions (Addendum)
Your physician recommends that you continue on your current medications as directed. Please refer to the Current Medication list given to you today.  Your physician has requested that you have an echocardiogram the same day you see Dr. Jetty Duhamel. Echocardiography is a painless test that uses sound waves to create images of your heart. It provides your doctor with information about the size and shape of your heart and how well your heart's chambers and valves are working. This procedure takes approximately one hour. There are no restrictions for this procedure.  Your physician recommends that you schedule a follow-up appointment in: 4 months with Dr. Irish Lack (echo to be done same day)  Remote monitoring is used to monitor your pacemaker from home. This monitoring reduces the number of office visits required to check your device to one time per year. It allows [place] to keep an eye on the functioning of your device to ensure it is working properly. You are scheduled for a device check from home on 02-01-2014. You may send your transmission at any time that day. If you have a wireless device, the transmission will be sent automatically. After your physician reviews your transmission, you will receive a postcard with your next transmission date.  Your physician recommends that you schedule a follow-up appointment in: 12 months with Dr.Klein

## 2013-10-30 DIAGNOSIS — M2550 Pain in unspecified joint: Secondary | ICD-10-CM | POA: Diagnosis not present

## 2013-10-30 DIAGNOSIS — R279 Unspecified lack of coordination: Secondary | ICD-10-CM | POA: Diagnosis not present

## 2013-10-30 DIAGNOSIS — M6281 Muscle weakness (generalized): Secondary | ICD-10-CM | POA: Diagnosis not present

## 2013-10-30 DIAGNOSIS — R269 Unspecified abnormalities of gait and mobility: Secondary | ICD-10-CM | POA: Diagnosis not present

## 2013-10-30 DIAGNOSIS — G8190 Hemiplegia, unspecified affecting unspecified side: Secondary | ICD-10-CM | POA: Diagnosis not present

## 2013-11-02 DIAGNOSIS — M6281 Muscle weakness (generalized): Secondary | ICD-10-CM | POA: Diagnosis not present

## 2013-11-02 DIAGNOSIS — R279 Unspecified lack of coordination: Secondary | ICD-10-CM | POA: Diagnosis not present

## 2013-11-02 DIAGNOSIS — M2550 Pain in unspecified joint: Secondary | ICD-10-CM | POA: Diagnosis not present

## 2013-11-02 DIAGNOSIS — R269 Unspecified abnormalities of gait and mobility: Secondary | ICD-10-CM | POA: Diagnosis not present

## 2013-11-02 DIAGNOSIS — G8190 Hemiplegia, unspecified affecting unspecified side: Secondary | ICD-10-CM | POA: Diagnosis not present

## 2013-11-03 DIAGNOSIS — M2550 Pain in unspecified joint: Secondary | ICD-10-CM | POA: Diagnosis not present

## 2013-11-03 DIAGNOSIS — M6281 Muscle weakness (generalized): Secondary | ICD-10-CM | POA: Diagnosis not present

## 2013-11-03 DIAGNOSIS — G8190 Hemiplegia, unspecified affecting unspecified side: Secondary | ICD-10-CM | POA: Diagnosis not present

## 2013-11-05 DIAGNOSIS — M6281 Muscle weakness (generalized): Secondary | ICD-10-CM | POA: Diagnosis not present

## 2013-11-05 DIAGNOSIS — M2550 Pain in unspecified joint: Secondary | ICD-10-CM | POA: Diagnosis not present

## 2013-11-05 DIAGNOSIS — G8190 Hemiplegia, unspecified affecting unspecified side: Secondary | ICD-10-CM | POA: Diagnosis not present

## 2013-11-10 ENCOUNTER — Encounter: Payer: Self-pay | Admitting: Internal Medicine

## 2013-11-10 DIAGNOSIS — M6281 Muscle weakness (generalized): Secondary | ICD-10-CM | POA: Diagnosis not present

## 2013-11-10 DIAGNOSIS — M2550 Pain in unspecified joint: Secondary | ICD-10-CM | POA: Diagnosis not present

## 2013-11-10 DIAGNOSIS — G8190 Hemiplegia, unspecified affecting unspecified side: Secondary | ICD-10-CM | POA: Diagnosis not present

## 2013-11-11 ENCOUNTER — Encounter: Payer: Self-pay | Admitting: Interventional Cardiology

## 2013-11-11 DIAGNOSIS — M6281 Muscle weakness (generalized): Secondary | ICD-10-CM | POA: Diagnosis not present

## 2013-11-11 DIAGNOSIS — G8190 Hemiplegia, unspecified affecting unspecified side: Secondary | ICD-10-CM | POA: Diagnosis not present

## 2013-11-11 DIAGNOSIS — M2550 Pain in unspecified joint: Secondary | ICD-10-CM | POA: Diagnosis not present

## 2013-11-12 DIAGNOSIS — M2550 Pain in unspecified joint: Secondary | ICD-10-CM | POA: Diagnosis not present

## 2013-11-12 DIAGNOSIS — M6281 Muscle weakness (generalized): Secondary | ICD-10-CM | POA: Diagnosis not present

## 2013-11-12 DIAGNOSIS — G8190 Hemiplegia, unspecified affecting unspecified side: Secondary | ICD-10-CM | POA: Diagnosis not present

## 2013-11-13 DIAGNOSIS — G8190 Hemiplegia, unspecified affecting unspecified side: Secondary | ICD-10-CM | POA: Diagnosis not present

## 2013-11-13 DIAGNOSIS — M2550 Pain in unspecified joint: Secondary | ICD-10-CM | POA: Diagnosis not present

## 2013-11-13 DIAGNOSIS — M6281 Muscle weakness (generalized): Secondary | ICD-10-CM | POA: Diagnosis not present

## 2013-11-16 ENCOUNTER — Encounter: Payer: Self-pay | Admitting: Internal Medicine

## 2013-11-16 DIAGNOSIS — G8190 Hemiplegia, unspecified affecting unspecified side: Secondary | ICD-10-CM | POA: Diagnosis not present

## 2013-11-16 DIAGNOSIS — M6281 Muscle weakness (generalized): Secondary | ICD-10-CM | POA: Diagnosis not present

## 2013-11-16 DIAGNOSIS — M2550 Pain in unspecified joint: Secondary | ICD-10-CM | POA: Diagnosis not present

## 2013-11-16 DIAGNOSIS — Z79899 Other long term (current) drug therapy: Secondary | ICD-10-CM | POA: Insufficient documentation

## 2013-11-17 ENCOUNTER — Non-Acute Institutional Stay (SKILLED_NURSING_FACILITY): Payer: Medicare Other | Admitting: Nurse Practitioner

## 2013-11-17 DIAGNOSIS — L851 Acquired keratosis [keratoderma] palmaris et plantaris: Secondary | ICD-10-CM | POA: Diagnosis not present

## 2013-11-17 DIAGNOSIS — L608 Other nail disorders: Secondary | ICD-10-CM | POA: Diagnosis not present

## 2013-11-17 DIAGNOSIS — I10 Essential (primary) hypertension: Secondary | ICD-10-CM | POA: Diagnosis not present

## 2013-11-17 DIAGNOSIS — I69959 Hemiplegia and hemiparesis following unspecified cerebrovascular disease affecting unspecified side: Secondary | ICD-10-CM | POA: Diagnosis not present

## 2013-11-17 DIAGNOSIS — K224 Dyskinesia of esophagus: Secondary | ICD-10-CM | POA: Diagnosis not present

## 2013-11-17 DIAGNOSIS — E1159 Type 2 diabetes mellitus with other circulatory complications: Secondary | ICD-10-CM | POA: Diagnosis not present

## 2013-11-17 DIAGNOSIS — I69351 Hemiplegia and hemiparesis following cerebral infarction affecting right dominant side: Secondary | ICD-10-CM

## 2013-11-17 DIAGNOSIS — M1990 Unspecified osteoarthritis, unspecified site: Secondary | ICD-10-CM | POA: Diagnosis not present

## 2013-11-17 DIAGNOSIS — F411 Generalized anxiety disorder: Secondary | ICD-10-CM

## 2013-11-17 NOTE — Progress Notes (Signed)
Patient ID: Rachel Cooke, female   DOB: [birth year], 80 y.o.   MRN: [id number]    [facility] [phone])  Code Status: DNR  Contact Information   Name Relation Home Work [place] Daughter [phone]  [phone]       Chief Complaint  Patient presents with  . Medical Management of Chronic Issues    HPI: This is a 80 yo female resident of [facility], Skilled nursing section with PMH of htn, DM, OA, hx of CVA with dysphagia and right sided weakness, dyslipidemia who is being evaluated today in for evaluation of chronic conditions. Has been stable since the last visit without acute issues. Pt without complaints or concerns. Reports she is doing well.   Allergies  Allergen Reactions  . Lactose Intolerance (Gi)    MEDICATION Current Outpatient Prescriptions on File Prior to Visit  Medication Sig Dispense Refill  . acetaminophen (TYLENOL) 650 MG CR tablet Take 650 mg by mouth 2 (two) times daily. For pain      . clopidogrel (PLAVIX) 75 MG tablet Take 75 mg by mouth daily. For stroke risk reduction      . diclofenac sodium (VOLTAREN) 1 % GEL Apply 2 g topically 3 (three) times daily. For pain( shoulder)      . escitalopram (LEXAPRO) 20 MG tablet Take 20 mg by mouth daily. For depression/ anxiety.      Marland Kitchen GLUCERNA (GLUCERNA) LIQD Take 237 mLs by mouth. At breakfast and lunch      . levothyroxine (SYNTHROID, LEVOTHROID) 75 MCG tablet Take 75 mcg by mouth daily before breakfast. For thyroid therapy      . liver oil-zinc oxide (DESITIN) 40 % ointment Apply 1 application topically as needed. Apply to rash on buttox/inner thighs after each BM for rash or irritation.      Marland Kitchen losartan (COZAAR) 50 MG tablet Take 50 mg by mouth daily. For HTN      . Menthol-Zinc Oxide (CALMOSEPTINE) 0.44-20.625 % OINT Apply 1 application topically as needed. Apply to peri-anal skin and inner buttox after each incontinent episode and PRN for irritation. .       . metoprolol succinate (TOPROL-XL) 50 MG 24 hr tablet Take 50 mg by mouth daily. Take with or immediately following a meal. For HTN      . NON FORMULARY Biotene Spray one spray before meals and at bedtime      . omeprazole (PRILOSEC) 20 MG capsule Take 20 mg by mouth daily. For GERD      . OVER THE COUNTER MEDICATION Take 1 spray by mouth 2 (two) times daily. Biotin spray      . OVER THE COUNTER MEDICATION Take 1 tablet by mouth 2 (two) times daily.      Vladimir Faster Glycol-Propyl Glycol (SYSTANE) 0.4-0.3 % SOLN Apply 1 drop to eye 2 (two) times daily. One drop both eye every 2 hours as needed for dryness or irritation      . sodium chloride (OCEAN) 0.65 % nasal spray Place 1 spray into the nose 2 (two) times daily.        No current facility-administered medications on file prior to visit.     DATA REVIEWED  Radiologic Exams  [facility] X-ray 07/04/2103 Rt. Knee, 3 views: Moderate to prominent degenerative arthritic changes primariloy involving medial compartment and patellofemoral joint  Cardiovascular Exams:   Laboratory Studies  [facility] 08/21/2012:  TSH 2.48  09/10/2102:  TC 162, TG 242, HDL 31,  LDL 94   A1C 6.0  11/27/2012 WBC 8.4, hemoglobin 13.8, hematocrit 41.6, platelets 176   Glucose 105, BUN 9, creatinine 0.59, sodium 142, potassium 3.4. Protein/LFTs WNL   Labs reviewed: Basic Metabolic Panel:  Recent Labs  03/10/13 08/04/13 08/11/13  NA 143 142 142  K 3.8 4.4 4.4  BUN  --  13 13  CREATININE 0.5 0.6 0.6   CBG: No results found for this basename: GLUCAP,  in the last 8760 hours TSH:  Recent Labs  08/11/13  TSH 0.37*   A1C: Lab Results  Component Value Date   HGBA1C 6.0 08/11/2013   Lipid Panel:  Recent Labs  03/10/13 08/04/13 08/11/13  CHOL 171 155 155  HDL 41 38 38  LDLCALC 95 96 96  TRIG 177* 196* 196*       REVIEW OF SYSTEMS DATA OBTAINED: from patient, nurse GENERAL: Feels well   No fevers, fatigue or changes in appetite SKIN: No  itch, rash or open wounds EYES: No eye pain, dryness or itching  No change in vision EARS: No earache, change in hearing, wears hearing aids NOSE: No congestion, drainage or bleeding MOUTH/THROAT: No mouth or tooth pain  No sore throat    No difficulty swallowing while on puree diet RESPIRATORY: No cough, wheezing, SOB CARDIAC: No chest pain, palpitations  No edema. GI: No abdominal pain  No N/V/D or constipation  No heartburn or reflux  GU: No dysuria, frequency or urgency MUSCULOSKELETAL:  leg weakness- uses Motorized wheelchair  No back pain  No muscle ache, pain, weakness   NEUROLOGIC: No dizziness, fainting, headache,  No change in mental status.  PSYCHIATRIC: No feelings of anxiety, depression   Sleeps well.  No behavior issue.    PHYSICAL EXAM  Filed Vitals:   11/17/13 1600  BP: 126/77  Pulse: 85  Temp: 97.4 F (36.3 C)  Resp: 20   There is no weight on file to calculate BMI.  GENERAL APPEARANCE: No acute distress, appropriately groomed, Overweight body habitus. Alert, pleasant, conversant . SKIN: No diaphoresis, rash,   HEENT: unremarkable  RESPIRATORY: Breathing is even, unlabored. Lung sounds are clear and full.  CARDIOVASCULAR: Heart RRR. No murmur or extra heart sounds GASTROINTESTINAL: Abdomen is soft, non-tender, not distended w/ normal bowel sounds. No hepatic or splenic enlargement. No mass, ventraltime or inguinal hernia. MUSCULOSKELETAL:  Strength 3/5 on the right and 5/5 on the left.  NEUROLOGIC: Oriented to , place, person. Right sided weakness from CVA PSYCHIATRIC: Mood and affect appropriate to situation  ASSESSMENT/PLAN  1. Hemiparesis affecting right side as late effect of cerebrovascular accident -stable, conts plavix   2. Osteoarthrosis, unspecified whether generalized or localized, unspecified site conts Voltaren gel  4. Esophageal dysmotility Without worsening of symptoms  conts on omeprazole   5. Essential hypertension Stable on current  medications  6. Anxiety state, unspecified -well controled on lexapro

## 2013-11-18 DIAGNOSIS — M6281 Muscle weakness (generalized): Secondary | ICD-10-CM | POA: Diagnosis not present

## 2013-11-18 DIAGNOSIS — M2550 Pain in unspecified joint: Secondary | ICD-10-CM | POA: Diagnosis not present

## 2013-11-18 DIAGNOSIS — G8190 Hemiplegia, unspecified affecting unspecified side: Secondary | ICD-10-CM | POA: Diagnosis not present

## 2013-11-20 DIAGNOSIS — G8190 Hemiplegia, unspecified affecting unspecified side: Secondary | ICD-10-CM | POA: Diagnosis not present

## 2013-11-20 DIAGNOSIS — M2550 Pain in unspecified joint: Secondary | ICD-10-CM | POA: Diagnosis not present

## 2013-11-20 DIAGNOSIS — M6281 Muscle weakness (generalized): Secondary | ICD-10-CM | POA: Diagnosis not present

## 2013-11-25 DIAGNOSIS — M6281 Muscle weakness (generalized): Secondary | ICD-10-CM | POA: Diagnosis not present

## 2013-11-25 DIAGNOSIS — G8190 Hemiplegia, unspecified affecting unspecified side: Secondary | ICD-10-CM | POA: Diagnosis not present

## 2013-11-25 DIAGNOSIS — M2550 Pain in unspecified joint: Secondary | ICD-10-CM | POA: Diagnosis not present

## 2013-11-27 ENCOUNTER — Encounter: Payer: Self-pay | Admitting: Internal Medicine

## 2013-11-27 DIAGNOSIS — M2550 Pain in unspecified joint: Secondary | ICD-10-CM | POA: Diagnosis not present

## 2013-11-27 DIAGNOSIS — G8190 Hemiplegia, unspecified affecting unspecified side: Secondary | ICD-10-CM | POA: Diagnosis not present

## 2013-11-27 DIAGNOSIS — M6281 Muscle weakness (generalized): Secondary | ICD-10-CM | POA: Diagnosis not present

## 2013-12-09 DIAGNOSIS — Z23 Encounter for immunization: Secondary | ICD-10-CM | POA: Diagnosis not present

## 2013-12-16 DIAGNOSIS — S90411S Abrasion, right great toe, sequela: Secondary | ICD-10-CM | POA: Diagnosis not present

## 2013-12-20 DIAGNOSIS — M1990 Unspecified osteoarthritis, unspecified site: Secondary | ICD-10-CM | POA: Diagnosis not present

## 2013-12-20 DIAGNOSIS — L03031 Cellulitis of right toe: Secondary | ICD-10-CM | POA: Diagnosis not present

## 2013-12-20 LAB — POCT ERYTHROCYTE SEDIMENTATION RATE, NON-AUTOMATED: Sed Rate: 32 mm

## 2013-12-20 LAB — CBC AND DIFFERENTIAL
HEMATOCRIT: 38 % (ref 36–46)
HEMOGLOBIN: 12.2 g/dL (ref 12.0–16.0)
PLATELETS: 189 10*3/uL (ref 150–399)
WBC: 8.6 10*3/mL

## 2013-12-28 ENCOUNTER — Ambulatory Visit (INDEPENDENT_AMBULATORY_CARE_PROVIDER_SITE_OTHER): Payer: Medicare Other | Admitting: Podiatry

## 2013-12-28 ENCOUNTER — Encounter: Payer: Self-pay | Admitting: Podiatry

## 2013-12-28 VITALS — BP 129/66 | HR 80 | Temp 96.9°F | Resp 15 | Ht 62.0 in | Wt 154.0 lb

## 2013-12-28 DIAGNOSIS — L02611 Cutaneous abscess of right foot: Secondary | ICD-10-CM

## 2013-12-28 DIAGNOSIS — L03031 Cellulitis of right toe: Secondary | ICD-10-CM | POA: Diagnosis not present

## 2013-12-28 NOTE — Progress Notes (Signed)
   Subjective:    Patient ID: Rachel Cooke, female    DOB: 1919/03/24, 80 y.o.   MRN: [id number]  HPI Comments: N hammer toe L right 2nd toe D about May 2015  O worsening C contracts down, blister to scabbing dorsal toe, and pain A enclosed shoes T Dr. Mallie Mussel referred, and [facility] cultured, and prescribed Cipro, and dressing  This patient presents with her daughter after completing 7 days of Cipro 500 mg twice a day for treatment of an apparent infection in the second right toe. She denies any complaints from the Cipro. She presents for follow-up care and evaluation. She also presents with results of culture and sensitivity report and CBC dated 12/20/2013 at [facility]  lab partners Daughter also states that she is not wearing her compression hose on the right lower extremity. She continues to wear her compression hose in the left lower extremity   Review of Systems  All other systems reviewed and are negative.      Objective:   Physical Exam  Hard of hearing pleasant white female presents with her daughter. The patient is seated in a wheelchair and unable to transfer to treatment table  Vascular: Pitting edema right greater than left Compression hose on left extremity knot on right DP and PT pulses are trace palpable bilaterally  Neurological: Patient able to dorsi and plantarflex passively 3/5 bilaterally Sensation to 10 g monofilament wire intact 3/5 bilaterally Patient is nonambulatory  Dermatological: Low-grade erythema and edema second right toe. There is no erythema, warmth extending from the dorsal second right toe. There is crusting on the dorsal proximal interphalangeal joint second right toe without drainage, malodor or warmth  Musculoskeletal: Hammertoe deformity second right  Results of culture and sensitivity report date 12/20/2013 [facility] Serratia Marcesens Few Staphylococcus aureus  Sensitivity to ciprofloxacin for both organisms  CBC  with differential dated 12/20/2013 [facility] Within normal limits WBC 8.6  Sedimentation rate 32 mm/HR slightly elevated  The results of the CBC and culture and sensitivity were scanned and are visible in the media center    Assessment & Plan:   Assessment: Cellulitis second right toe Edema bilaterally right greater than left Edema left may be reduced as patient is wearing compression garment on left and no compression garment on right  Plan: Continue on Cipro 500 mg by mouth twice a day 7 days Please note Cipro was ordered on consultation form form from [place], rather than on the medication order form in the electronic medical record  Apply triple antibiotic ointment to the second right toe daily and cover with gauze Wear open toe compression hose and right lower extremity Wear a sock and right foot  If patient develops any sudden fever, increasing pain to present to ER otherwise reevaluate 7 days

## 2013-12-28 NOTE — Patient Instructions (Signed)
Wrote order on [facility] Continue on Cipro 500 mg by mouth twice a day 7 days Apply triple antibiotic ointment to second right toe daily and cover with gauze Where open toe compression hose right lower leg Wear sock and right foot  If patient develops any sudden fever, pain present to ER

## 2013-12-29 ENCOUNTER — Encounter: Payer: Self-pay | Admitting: Podiatry

## 2013-12-29 ENCOUNTER — Non-Acute Institutional Stay (SKILLED_NURSING_FACILITY): Payer: Medicare Other | Admitting: Nurse Practitioner

## 2013-12-29 DIAGNOSIS — M2041 Other hammer toe(s) (acquired), right foot: Secondary | ICD-10-CM | POA: Diagnosis not present

## 2013-12-29 DIAGNOSIS — I69351 Hemiplegia and hemiparesis following cerebral infarction affecting right dominant side: Secondary | ICD-10-CM

## 2013-12-29 DIAGNOSIS — I69851 Hemiplegia and hemiparesis following other cerebrovascular disease affecting right dominant side: Secondary | ICD-10-CM

## 2013-12-29 DIAGNOSIS — F411 Generalized anxiety disorder: Secondary | ICD-10-CM | POA: Diagnosis not present

## 2013-12-29 DIAGNOSIS — M1990 Unspecified osteoarthritis, unspecified site: Secondary | ICD-10-CM

## 2013-12-29 DIAGNOSIS — I10 Essential (primary) hypertension: Secondary | ICD-10-CM | POA: Diagnosis not present

## 2013-12-29 DIAGNOSIS — I69951 Hemiplegia and hemiparesis following unspecified cerebrovascular disease affecting right dominant side: Secondary | ICD-10-CM | POA: Diagnosis not present

## 2013-12-29 DIAGNOSIS — R278 Other lack of coordination: Secondary | ICD-10-CM | POA: Diagnosis not present

## 2013-12-29 DIAGNOSIS — M79674 Pain in right toe(s): Secondary | ICD-10-CM | POA: Diagnosis not present

## 2013-12-29 DIAGNOSIS — K224 Dyskinesia of esophagus: Secondary | ICD-10-CM

## 2013-12-29 DIAGNOSIS — M2040 Other hammer toe(s) (acquired), unspecified foot: Secondary | ICD-10-CM | POA: Diagnosis not present

## 2013-12-29 DIAGNOSIS — L03031 Cellulitis of right toe: Secondary | ICD-10-CM

## 2013-12-29 DIAGNOSIS — R2689 Other abnormalities of gait and mobility: Secondary | ICD-10-CM | POA: Diagnosis not present

## 2013-12-29 NOTE — Progress Notes (Signed)
Patient ID: Rachel Cooke, female   DOB: 26-Jan-1920, 80 y.o.   MRN: [id number]    Nursing Home Location:  [place]   Place of Service: SNF (31)  PCP: Estill Dooms, MD  Allergies  Allergen Reactions  . Lactose Intolerance (Gi)     Chief Complaint  Patient presents with  . Medical Management of Chronic Issues    HPI:  Patient is a 80 y.o. female seen today at [facility], Skilled nursing section with PMH of htn, DM, OA, hx of CVA with dysphagia right sided weakness, and dyslipidemia who is being seen today in for follow up on  chronic conditions. Pt has been following with podiatry due to cellulitis in right toe. Was seen yesterday and given extended course of cipro. Also has worsening swelling to Right LE but was not wearing TED hose. She is now wearing  Review of Systems:  Review of Systems  Constitutional: Negative for activity change, appetite change, fatigue and unexpected weight change.  HENT: Positive for hearing loss. Negative for congestion.   Eyes: Negative.   Respiratory: Negative for cough and shortness of breath.   Cardiovascular: Negative for chest pain, palpitations and leg swelling.  Gastrointestinal: Negative for abdominal pain, diarrhea and constipation.  Genitourinary: Negative for dysuria and difficulty urinating.  Musculoskeletal: Negative for arthralgias and myalgias.  Skin: Negative for color change.  Neurological: Positive for weakness. Negative for dizziness.  Psychiatric/Behavioral: Negative for behavioral problems, confusion and agitation.    Past Medical History  Diagnosis Date  . Hypertension   . Dendritic keratitis 2012  . Malignant neoplasm of breast (female), unspecified site 1970    S/P Rt radical mastectomy  . Unspecified hypothyroidism 2009  . Type II or unspecified type diabetes mellitus without mention of complication, not stated as uncontrolled 2003  . Unspecified vitamin D deficiency 2009   . Pure hyperglyceridemia 2004  . Anxiety state, unspecified   . Macular degeneration (senile) of retina, unspecified 2012  . Unspecified glaucoma 2013  . Tinnitus 2007  . Coronary atherosclerosis of native coronary artery 2003    non obstructive by Cath 2003  . Atrioventricular block, complete 2003    s/p PPM 2003, generator chnage 2011  . Allergic rhinitis due to pollen   . Acquired cyst of kidney 2002  . Osteoarthrosis, unspecified whether generalized or localized, unspecified site 2013  . Unspecified arthropathy, pelvic region and thigh 2010  . Senile osteoporosis 2003  . Abnormality of gait 2007  . Edema 2003  . Personal history of colonic polyps      s/p colonoscopy/polypectomy 2003  . Cardiac pacemaker in situ 2003  . CVA (cerebral infarction) 06/28/2012    rt hemiparesis, dysphagia  . NSTEMI (non-ST elevated myocardial infarction) 06/28/2012  . Urinary retention 07/07/2012  . Pacemaker   . Stroke 06/28/2012  . Food impaction of esophagus 08/15/2012  . Esophageal dysmotility 08/19/2012  . Acute sinus infection 11/25/2012  . Altered mental status 11/28/2012   Past Surgical History  Procedure Laterality Date  . Mastectomy Right 1970    Cancer  . Insert / replace / remove pacemaker    . Esophagogastroduodenoscopy N/A 08/15/2012    Procedure: ESOPHAGOGASTRODUODENOSCOPY (EGD);  Surgeon: [name], MD;  Location: [facility] ENDOSCOPY;  Service: Endoscopy;  Laterality: N/A;  . Esophagoscopy N/A 08/15/2012    Procedure: ESOPHAGOSCOPY;  Surgeon: [name], MD;  Location: [facility] OR;  Service: ENT;  Laterality: N/A;  . Foreign body removal esophageal N/A 08/15/2012  Procedure: REMOVAL FOREIGN BODY ESOPHAGEAL;  Surgeon: [name], MD;  Location: [facility] OR;  Service: ENT;  Laterality: N/A;  . Esophagogastroduodenoscopy N/A 08/15/2012    Procedure: ESOPHAGOGASTRODUODENOSCOPY (EGD);  Surgeon: [name], MD;  Location: [place];  Service: Endoscopy;  Laterality: N/A;  . Esophagogastroduodenoscopy N/A  10/03/2012    Procedure: ESOPHAGOGASTRODUODENOSCOPY (EGD);  Surgeon: [name], MD;  Location: [facility]COPY;  Service: Endoscopy;  Laterality: N/A;  . Savory dilation N/A 10/03/2012    Procedure: SAVORY DILATION;  Surgeon: [name], MD;  Location: [facility] ENDOSCOPY;  Service: Endoscopy;  Laterality: N/A;  . Botox injection N/A 10/03/2012    Procedure: BOTOX INJECTION;  Surgeon: [name], MD;  Location: [facility] ENDOSCOPY;  Service: Endoscopy;  Laterality: N/A;  . Esophagogastroduodenoscopy N/A 12/22/2012    Procedure: ESOPHAGOGASTRODUODENOSCOPY (EGD);  Surgeon: [name], MD;  Location: [facility] ENDOSCOPY;  Service: Endoscopy;  Laterality: N/A;   Social History:   reports that she has never smoked. She has never used smokeless tobacco. She reports that she does not drink alcohol or use illicit drugs.  Family History  Problem Relation Age of Onset  . Hypertension Father     Medications: Patient's Medications  New Prescriptions   No medications on file  Previous Medications   ACETAMINOPHEN (TYLENOL) 650 MG CR TABLET    Take 650 mg by mouth 2 (two) times daily. For pain   CIPROFLOXACIN (CIPRO) 500 MG TABLET    Take 500 mg by mouth 2 (two) times daily.   CLOPIDOGREL (PLAVIX) 75 MG TABLET    Take 75 mg by mouth daily. For stroke risk reduction   DICLOFENAC SODIUM (VOLTAREN) 1 % GEL    Apply 2 g topically 3 (three) times daily. For pain( shoulder)   ESCITALOPRAM (LEXAPRO) 20 MG TABLET    Take 20 mg by mouth daily. For depression/ anxiety.   GLUCERNA (GLUCERNA) LIQD    Take 237 mLs by mouth. At breakfast and lunch   LEVOTHYROXINE (SYNTHROID, LEVOTHROID) 75 MCG TABLET    Take 75 mcg by mouth daily before breakfast. For thyroid therapy   LIVER OIL-ZINC OXIDE (DESITIN) 40 % OINTMENT    Apply 1 application topically as needed. Apply to rash on buttox/inner thighs after each BM for rash or irritation.   LOSARTAN (COZAAR) 50 MG TABLET    Take 50 mg by mouth daily. For HTN   MENTHOL-ZINC OXIDE  (CALMOSEPTINE) 0.44-20.625 % OINT    Apply 1 application topically as needed. Apply to peri-anal skin and inner buttox after each incontinent episode and PRN for irritation. .   NON FORMULARY    Biotene Spray one spray before meals and at bedtime   OMEPRAZOLE (PRILOSEC) 20 MG CAPSULE    Take 20 mg by mouth daily. For GERD   OVER THE COUNTER MEDICATION    Take 1 spray by mouth 2 (two) times daily. Biotin spray   POLYETHYL GLYCOL-PROPYL GLYCOL (SYSTANE) 0.4-0.3 % SOLN    Apply 1 drop to eye 2 (two) times daily. One drop both eye every 2 hours as needed for dryness or irritation   SODIUM CHLORIDE (OCEAN) 0.65 % NASAL SPRAY    Place 1 spray into the nose 2 (two) times daily.   Modified Medications   No medications on file  Discontinued Medications   METOPROLOL SUCCINATE (TOPROL-XL) 50 MG 24 HR TABLET    Take 50 mg by mouth daily. Take with or immediately following a meal. For HTN     Physical  Exam: Filed Vitals:   12/29/13 1527  BP: 108/65  Pulse: 82  Temp: 97.3 F (36.3 C)  Resp: 20    Physical Exam  Constitutional: She is oriented to person, place, and time. She appears well-developed and well-nourished. No distress.  HENT:  Head: Normocephalic and atraumatic.  Mouth/Throat: Oropharynx is clear and moist. No oropharyngeal exudate.  Eyes: Conjunctivae are normal. Pupils are equal, round, and reactive to light.  Neck: Normal range of motion. Neck supple.  Cardiovascular: Normal rate, regular rhythm and normal heart sounds.   Pulmonary/Chest: Effort normal and breath sounds normal.  Abdominal: Soft. Bowel sounds are normal.  Musculoskeletal: She exhibits edema (bilateral LE). She exhibits no tenderness.  Neurological: She is alert and oriented to person, place, and time.  Right sided weakness due to CVA  Skin: Skin is warm and dry. She is not diaphoretic.  Psychiatric: She has a normal mood and affect.    Labs reviewed: Basic Metabolic Panel:  Recent Labs  08/04/13 08/11/13  10/01/13  NA 142 142 140  K 4.4 4.4 3.9  BUN 13 13 13   CREATININE 0.6 0.6 0.5   Liver Function Tests:  Recent Labs  10/01/13  AST 21  ALT 13  ALKPHOS 30   No results found for this basename: LIPASE, AMYLASE,  in the last 8760 hours No results found for this basename: AMMONIA,  in the last 8760 hours CBC:  Recent Labs  10/01/13 12/20/13  WBC 6.0 8.6  HGB 11.8* 12.2  HCT 34* 38  PLT 172 189   TSH:  Recent Labs  08/11/13  TSH 0.37*   A1C: Lab Results  Component Value Date   HGBA1C 6.0 08/11/2013   Lipid Panel:  Recent Labs  03/10/13 08/04/13 08/11/13  CHOL 171 155 155  HDL 41 38 38  LDLCALC 95 96 96  TRIG 177* 196* 196*      Assessment/Plan 1. Essential hypertension -continues lopressor, blood pressure controlled.   2. Osteoarthritis, unspecified osteoarthritis type, unspecified site Pain well controlled on current regimen   3. Esophageal dysmotility conts on prilosec, sees GI for this  4. Anxiety state -controlled on current dose of lexapro  5. Hemiparesis affecting right side as late effect of cerebrovascular accident Right sided weakness, conts on plavix   6. Cellulitis of toe of right foot Following with podiatry, conts on another 7 day course of cipro -will cont florastor for GI health

## 2013-12-31 DIAGNOSIS — R2689 Other abnormalities of gait and mobility: Secondary | ICD-10-CM | POA: Diagnosis not present

## 2013-12-31 DIAGNOSIS — R278 Other lack of coordination: Secondary | ICD-10-CM | POA: Diagnosis not present

## 2013-12-31 DIAGNOSIS — M79674 Pain in right toe(s): Secondary | ICD-10-CM | POA: Diagnosis not present

## 2013-12-31 DIAGNOSIS — I69951 Hemiplegia and hemiparesis following unspecified cerebrovascular disease affecting right dominant side: Secondary | ICD-10-CM | POA: Diagnosis not present

## 2013-12-31 DIAGNOSIS — M2041 Other hammer toe(s) (acquired), right foot: Secondary | ICD-10-CM | POA: Diagnosis not present

## 2013-12-31 DIAGNOSIS — M2040 Other hammer toe(s) (acquired), unspecified foot: Secondary | ICD-10-CM | POA: Diagnosis not present

## 2014-01-04 DIAGNOSIS — R278 Other lack of coordination: Secondary | ICD-10-CM | POA: Diagnosis not present

## 2014-01-04 DIAGNOSIS — M79674 Pain in right toe(s): Secondary | ICD-10-CM | POA: Diagnosis not present

## 2014-01-04 DIAGNOSIS — R2689 Other abnormalities of gait and mobility: Secondary | ICD-10-CM | POA: Diagnosis not present

## 2014-01-04 DIAGNOSIS — I69951 Hemiplegia and hemiparesis following unspecified cerebrovascular disease affecting right dominant side: Secondary | ICD-10-CM | POA: Diagnosis not present

## 2014-01-04 DIAGNOSIS — M2041 Other hammer toe(s) (acquired), right foot: Secondary | ICD-10-CM | POA: Diagnosis not present

## 2014-01-04 DIAGNOSIS — M2040 Other hammer toe(s) (acquired), unspecified foot: Secondary | ICD-10-CM | POA: Diagnosis not present

## 2014-01-06 ENCOUNTER — Encounter: Payer: Self-pay | Admitting: Podiatry

## 2014-01-06 ENCOUNTER — Ambulatory Visit (INDEPENDENT_AMBULATORY_CARE_PROVIDER_SITE_OTHER): Payer: Medicare Other

## 2014-01-06 ENCOUNTER — Ambulatory Visit (INDEPENDENT_AMBULATORY_CARE_PROVIDER_SITE_OTHER): Payer: Medicare Other | Admitting: Podiatry

## 2014-01-06 VITALS — BP 108/57 | HR 82 | Temp 97.1°F | Resp 12

## 2014-01-06 DIAGNOSIS — L02611 Cutaneous abscess of right foot: Secondary | ICD-10-CM | POA: Diagnosis not present

## 2014-01-06 DIAGNOSIS — L03031 Cellulitis of right toe: Secondary | ICD-10-CM | POA: Diagnosis not present

## 2014-01-06 DIAGNOSIS — R52 Pain, unspecified: Secondary | ICD-10-CM

## 2014-01-06 NOTE — Patient Instructions (Signed)
Apply triple antibiotic ointment daily to the second right toe cover with gauze and attach with one-inch Coflex tape until drainage stops Okay to continue open toe compression hose right lower extremity Wear surgical shoe on right foot

## 2014-01-06 NOTE — Progress Notes (Signed)
Patient ID: Rachel Cooke, female   DOB: 19-Aug-1919, 80 y.o.   MRN: [id number]  Subjective: Her son-in-law is present today On the initial visit of 12/29/2013 Dr. Mallie Mussel at [facility] cultured the wound on the second right toe and prescribed Cipro 500 mg twice a day 7 days based on culture and sensitivity. This patient presents for follow-up care for abscess cellulitis second right toe and has completed 14 days of Cipro without a complaint from the medication.  Objective: Patient is seated in a wheelchair and unable to transfer The second right toe is mildly edematous with a 2 mm superficial ulcer noted on the dorsal proximal interphalangeal joint without any active drainage noted. There is no malodor, warmth in the second toe Hammertoe deformity second right There is no erythema noted on the dorsum of the right foot Patient wearing compression garments bilaterally with  Symmetrical edema noted bilaterally  X-ray examination nonweightbearing right foot  Intact bony structure without a fracture and/or dislocation noted Advanced hammertoe second toe The second toe demonstrates no cortical disruptions, or emphysema  Radiographic impression: No acute bony abnormality noted in the right foot No x-ray evidence of osteomyelitis in the second toe right foot  Assessment: Resolving cellulitis second right toe with superficial ulceration Severe hammertoe second right  Plan: Apply triple antibiotic ointment daily to the second right toe cover with gauze and attach with Coflex tape Continue to wear open toe lower extremity compression hose right Continue to wear open toe or surgical shoe right  Reappoint 2 weeks

## 2014-01-07 DIAGNOSIS — M79674 Pain in right toe(s): Secondary | ICD-10-CM | POA: Diagnosis not present

## 2014-01-07 DIAGNOSIS — I69951 Hemiplegia and hemiparesis following unspecified cerebrovascular disease affecting right dominant side: Secondary | ICD-10-CM | POA: Diagnosis not present

## 2014-01-07 DIAGNOSIS — R2689 Other abnormalities of gait and mobility: Secondary | ICD-10-CM | POA: Diagnosis not present

## 2014-01-07 DIAGNOSIS — M2041 Other hammer toe(s) (acquired), right foot: Secondary | ICD-10-CM | POA: Diagnosis not present

## 2014-01-07 DIAGNOSIS — R278 Other lack of coordination: Secondary | ICD-10-CM | POA: Diagnosis not present

## 2014-01-07 DIAGNOSIS — M2040 Other hammer toe(s) (acquired), unspecified foot: Secondary | ICD-10-CM | POA: Diagnosis not present

## 2014-01-08 DIAGNOSIS — I69951 Hemiplegia and hemiparesis following unspecified cerebrovascular disease affecting right dominant side: Secondary | ICD-10-CM | POA: Diagnosis not present

## 2014-01-08 DIAGNOSIS — M79674 Pain in right toe(s): Secondary | ICD-10-CM | POA: Diagnosis not present

## 2014-01-08 DIAGNOSIS — M2041 Other hammer toe(s) (acquired), right foot: Secondary | ICD-10-CM | POA: Diagnosis not present

## 2014-01-08 DIAGNOSIS — M2040 Other hammer toe(s) (acquired), unspecified foot: Secondary | ICD-10-CM | POA: Diagnosis not present

## 2014-01-08 DIAGNOSIS — R278 Other lack of coordination: Secondary | ICD-10-CM | POA: Diagnosis not present

## 2014-01-08 DIAGNOSIS — R2689 Other abnormalities of gait and mobility: Secondary | ICD-10-CM | POA: Diagnosis not present

## 2014-01-11 DIAGNOSIS — M2041 Other hammer toe(s) (acquired), right foot: Secondary | ICD-10-CM | POA: Diagnosis not present

## 2014-01-11 DIAGNOSIS — R2689 Other abnormalities of gait and mobility: Secondary | ICD-10-CM | POA: Diagnosis not present

## 2014-01-11 DIAGNOSIS — M79674 Pain in right toe(s): Secondary | ICD-10-CM | POA: Diagnosis not present

## 2014-01-11 DIAGNOSIS — R278 Other lack of coordination: Secondary | ICD-10-CM | POA: Diagnosis not present

## 2014-01-11 DIAGNOSIS — M2040 Other hammer toe(s) (acquired), unspecified foot: Secondary | ICD-10-CM | POA: Diagnosis not present

## 2014-01-11 DIAGNOSIS — I69951 Hemiplegia and hemiparesis following unspecified cerebrovascular disease affecting right dominant side: Secondary | ICD-10-CM | POA: Diagnosis not present

## 2014-01-18 ENCOUNTER — Ambulatory Visit (INDEPENDENT_AMBULATORY_CARE_PROVIDER_SITE_OTHER): Payer: Medicare Other | Admitting: Podiatry

## 2014-01-18 ENCOUNTER — Encounter: Payer: Self-pay | Admitting: Podiatry

## 2014-01-18 VITALS — BP 112/60 | HR 92 | Temp 97.8°F | Resp 14

## 2014-01-18 DIAGNOSIS — R278 Other lack of coordination: Secondary | ICD-10-CM | POA: Diagnosis not present

## 2014-01-18 DIAGNOSIS — R2689 Other abnormalities of gait and mobility: Secondary | ICD-10-CM | POA: Diagnosis not present

## 2014-01-18 DIAGNOSIS — L02611 Cutaneous abscess of right foot: Secondary | ICD-10-CM | POA: Diagnosis not present

## 2014-01-18 DIAGNOSIS — L03031 Cellulitis of right toe: Secondary | ICD-10-CM

## 2014-01-18 DIAGNOSIS — I69951 Hemiplegia and hemiparesis following unspecified cerebrovascular disease affecting right dominant side: Secondary | ICD-10-CM | POA: Diagnosis not present

## 2014-01-18 DIAGNOSIS — M2040 Other hammer toe(s) (acquired), unspecified foot: Secondary | ICD-10-CM | POA: Diagnosis not present

## 2014-01-18 DIAGNOSIS — M79674 Pain in right toe(s): Secondary | ICD-10-CM | POA: Diagnosis not present

## 2014-01-18 DIAGNOSIS — M2041 Other hammer toe(s) (acquired), right foot: Secondary | ICD-10-CM | POA: Diagnosis not present

## 2014-01-18 NOTE — Patient Instructions (Signed)
Continue to apply triple antibiotic ointment daily to the second right toe, cover with gauze and attach with Coflex tape until healed Continue to wear surgical shoe on right foot until healed Continue to wear open toe compression hose on the right leg  Reappoint if needed

## 2014-01-19 NOTE — Progress Notes (Signed)
Patient ID: Rachel Cooke, female   DOB: 09-20-1919, 80 y.o.   MRN: [id number]  Subjective: Her son-in-law is present today On the initial visit of 12/29/2013 Dr. Mallie Mussel at [facility] cultured the wound on the second right toe and prescribed Cipro 500 mg twice a day 7 days based on culture and sensitivity. This patient presents for follow-up care for abscess cellulitis second right toe and has completed 14 days of Cipro without a complaint from the medication.  Objective: Patient is seated in a wheelchair and unable to transfer The second right toe is mildly edematous with a 1-2 mm superficial ulcer with a granular base noted on the dorsal proximal interphalangeal joint without any active drainage noted. There is no malodor, warmth in the second toe Hammertoe deformity second right  Assessment: Resolving cellulitis second right toe with superficial skin ulceration Continued improvement with reduction of erythema and edema in the second right toe Hammertoe deformity second right  Plan: Continue to apply triple antibiotic ointment daily to the second right toe, cover with gauze, and attach with Coflex tape until healed Continue to wear surgical shoe on right foot Continue to wear open toe compression hose right  Reappoint at patient's request

## 2014-01-25 ENCOUNTER — Non-Acute Institutional Stay (SKILLED_NURSING_FACILITY): Payer: Medicare Other | Admitting: Adult Health

## 2014-01-25 ENCOUNTER — Encounter: Payer: Self-pay | Admitting: Adult Health

## 2014-01-25 DIAGNOSIS — E038 Other specified hypothyroidism: Secondary | ICD-10-CM

## 2014-01-25 DIAGNOSIS — I6310 Cerebral infarction due to embolism of unspecified precerebral artery: Secondary | ICD-10-CM

## 2014-01-25 DIAGNOSIS — E1159 Type 2 diabetes mellitus with other circulatory complications: Secondary | ICD-10-CM | POA: Diagnosis not present

## 2014-01-25 DIAGNOSIS — R05 Cough: Secondary | ICD-10-CM | POA: Insufficient documentation

## 2014-01-25 DIAGNOSIS — I10 Essential (primary) hypertension: Secondary | ICD-10-CM

## 2014-01-25 DIAGNOSIS — R278 Other lack of coordination: Secondary | ICD-10-CM | POA: Diagnosis not present

## 2014-01-25 DIAGNOSIS — I69951 Hemiplegia and hemiparesis following unspecified cerebrovascular disease affecting right dominant side: Secondary | ICD-10-CM | POA: Diagnosis not present

## 2014-01-25 DIAGNOSIS — K224 Dyskinesia of esophagus: Secondary | ICD-10-CM | POA: Diagnosis not present

## 2014-01-25 DIAGNOSIS — M2040 Other hammer toe(s) (acquired), unspecified foot: Secondary | ICD-10-CM | POA: Diagnosis not present

## 2014-01-25 DIAGNOSIS — M2041 Other hammer toe(s) (acquired), right foot: Secondary | ICD-10-CM | POA: Diagnosis not present

## 2014-01-25 DIAGNOSIS — R2689 Other abnormalities of gait and mobility: Secondary | ICD-10-CM | POA: Diagnosis not present

## 2014-01-25 DIAGNOSIS — M79674 Pain in right toe(s): Secondary | ICD-10-CM | POA: Diagnosis not present

## 2014-01-25 DIAGNOSIS — R059 Cough, unspecified: Secondary | ICD-10-CM

## 2014-01-25 NOTE — Assessment & Plan Note (Signed)
Robitussin DM 30ml TID for 3 days. Report fever 100.5 or greater, worsening condition, SOB, etc

## 2014-01-25 NOTE — Progress Notes (Signed)
Patient ID: Rachel Cooke, female   DOB: [birth year], 80 y.o.   MRN: [id number]  Nursing Home Location:  [place] of Service: SNF [phone])  Chief Complaint  Patient presents with  . Acute Visit    cough  . Medical Management of Chronic Issues    HPI: This is a 80 y.o. female residing at [facility], skilled section. I was asked to see her today due to a cough that has last for one week. She reports yellow, thick sputum. She also reports that she is beginning to feel better and has not had a fever. She denies CP, SOB, etc. She has otherwise been stable since her last visit. Her weight is stable at 155.9 lbs.  Her blood sugars are monitored 3 x weekly and they have ranged 104-115, fasting. She is ambulatory with a walker but also uses a scooter.  She is also followed by [facility] due to a wound on her right second toe. The last notes indicate that this has improved.   Review of Systems:  Review of Systems  Constitutional: Negative for fever, chills, diaphoresis, activity change, appetite change, fatigue and unexpected weight change.  HENT: Positive for congestion, hearing loss, postnasal drip and trouble swallowing. Negative for ear discharge, ear pain, sinus pressure, sneezing and sore throat.   Eyes: Negative.   Respiratory: Negative for cough and shortness of breath.   Cardiovascular: Negative for chest pain, palpitations and leg swelling.  Gastrointestinal: Negative for abdominal pain, diarrhea and constipation.  Genitourinary: Negative for dysuria and difficulty urinating.  Musculoskeletal: Positive for gait problem. Negative for myalgias and arthralgias.       Uses walker and scooter. Right sided weakness  Skin: Negative for color change.  Neurological: Positive for weakness. Negative for dizziness.  Psychiatric/Behavioral: Negative for behavioral problems, confusion and agitation.    Medications: Patient's  Medications  New Prescriptions   No medications on file  Previous Medications   ACETAMINOPHEN (TYLENOL) 650 MG CR TABLET    Take 650 mg by mouth 2 (two) times daily. For pain   CIPROFLOXACIN (CIPRO) 500 MG TABLET    Take 500 mg by mouth 2 (two) times daily.   CLOPIDOGREL (PLAVIX) 75 MG TABLET    Take 75 mg by mouth daily. For stroke risk reduction   DICLOFENAC SODIUM (VOLTAREN) 1 % GEL    Apply 2 g topically 3 (three) times daily. For pain( shoulder)   ESCITALOPRAM (LEXAPRO) 20 MG TABLET    Take 20 mg by mouth daily. For depression/ anxiety.   GLUCERNA (GLUCERNA) LIQD    Take 237 mLs by mouth. At breakfast and lunch   LEVOTHYROXINE (SYNTHROID, LEVOTHROID) 75 MCG TABLET    Take 75 mcg by mouth daily before breakfast. For thyroid therapy   LIVER OIL-ZINC OXIDE (DESITIN) 40 % OINTMENT    Apply 1 application topically as needed. Apply to rash on buttox/inner thighs after each BM for rash or irritation.   LOSARTAN (COZAAR) 50 MG TABLET    Take 50 mg by mouth daily. For HTN   MENTHOL-ZINC OXIDE (CALMOSEPTINE) 0.44-20.625 % OINT    Apply 1 application topically as needed. Apply to peri-anal skin and inner buttox after each incontinent episode and PRN for irritation. Marland Kitchen   METOPROLOL (LOPRESSOR) 50 MG TABLET    Take 50 mg by mouth 2 (two) times daily.   NON FORMULARY    Biotene Spray one spray before meals and at bedtime  OMEPRAZOLE (PRILOSEC) 20 MG CAPSULE    Take 20 mg by mouth daily. For GERD   OVER THE COUNTER MEDICATION    Take 1 spray by mouth 2 (two) times daily. Biotin spray   POLYETHYL GLYCOL-PROPYL GLYCOL (SYSTANE) 0.4-0.3 % SOLN    Apply 1 drop to eye 2 (two) times daily. One drop both eye every 2 hours as needed for dryness or irritation   SODIUM CHLORIDE (OCEAN) 0.65 % NASAL SPRAY    Place 1 spray into the nose 2 (two) times daily.   Modified Medications   No medications on file  Discontinued Medications   No medications on file     Physical Exam:  Filed Vitals:   01/25/14 1031    BP: 109/68  Pulse: 76  Temp: 97.6 F (36.4 C)  Resp: 20  Weight: 155 lb 14.4 oz (70.716 kg)    Physical Exam  Constitutional: She is oriented to person, place, and time.  Frail, elderly female  HENT:  Head: Normocephalic and atraumatic.  Right Ear: External ear normal.  Left Ear: External ear normal.  Nose: Nose normal.  Mouth/Throat: Oropharynx is clear and moist. No oropharyngeal exudate.  Eyes: Conjunctivae are normal. Pupils are equal, round, and reactive to light. Right eye exhibits no discharge. Left eye exhibits no discharge.  Neck: Neck supple. No JVD present. No tracheal deviation present. No thyromegaly present.  Cardiovascular: Normal rate, regular rhythm and normal heart sounds.   pacemaker  Pulmonary/Chest: Effort normal and breath sounds normal. No respiratory distress. She has no wheezes.  Abdominal: Soft. Bowel sounds are normal. She exhibits no distension. There is no tenderness.  Lymphadenopathy:    She has cervical adenopathy.  Neurological: She is alert and oriented to person, place, and time.  Right sided weakness  Skin: Skin is warm and dry. She is not diaphoretic.    Labs reviewed/Significant Diagnostic Results:  Basic Metabolic Panel:  Recent Labs  08/04/13 08/11/13 10/01/13  NA 142 142 140  K 4.4 4.4 3.9  BUN 13 13 13   CREATININE 0.6 0.6 0.5   Liver Function Tests:  Recent Labs  10/01/13  AST 21  ALT 13  ALKPHOS 30   No results for input(s): LIPASE, AMYLASE in the last 8760 hours. No results for input(s): AMMONIA in the last 8760 hours. CBC:  Recent Labs  10/01/13 12/20/13  WBC 6.0 8.6  HGB 11.8* 12.2  HCT 34* 38  PLT 172 189   CBG: No results for input(s): GLUCAP in the last 8760 hours. TSH:  Recent Labs  08/11/13  TSH 0.37*   A1C: Lab Results  Component Value Date   HGBA1C 6.0 08/11/2013   Lipid Panel:  Recent Labs  03/10/13 08/04/13 08/11/13  CHOL 171 155 155  HDL 41 38 38  LDLCALC 95 96 96  TRIG 177* 196*  196*   12/16/13: right second toe wound cx: few serratia, few staph  Assessment/Plan  Cough Robitussin DM 45ml TID for 3 days. Report fever 100.5 or greater, worsening condition, SOB, etc  Hypothyroidism Last TSH level low, recheck next draw. Continue current dose of synthroid for now.  Esophageal dysmotility Continue pureed food, no bouts of aspiration noted.  Diabetes CBGs WNL, diet controlled. Continue to monitor. Foot wound healing, followed by [facility]  HTN (hypertension) BP stable, BUN/CR WNL. Continue Cozaar and lopressor and continue to monitor.  CVA (cerebral infarction) Continue plavix, no new neurologic events. Continues with right hemiparesis and uses scooter for mobility.  Labs/tests ordered TSH  Cindi Carbon, ANP [facility] [phone]

## 2014-01-27 NOTE — Assessment & Plan Note (Signed)
BP stable, BUN/CR WNL. Continue Cozaar and lopressor and continue to monitor.

## 2014-01-27 NOTE — Assessment & Plan Note (Signed)
CBGs WNL, diet controlled. Continue to monitor. Foot wound healing, followed by [facility]

## 2014-01-27 NOTE — Assessment & Plan Note (Signed)
Continue plavix, no new neurologic events. Continues with right hemiparesis and uses scooter for mobility.

## 2014-01-27 NOTE — Assessment & Plan Note (Signed)
Last TSH level low, recheck next draw. Continue current dose of synthroid for now.

## 2014-01-27 NOTE — Assessment & Plan Note (Signed)
Continue pureed food, no bouts of aspiration noted.

## 2014-02-01 ENCOUNTER — Ambulatory Visit (INDEPENDENT_AMBULATORY_CARE_PROVIDER_SITE_OTHER): Payer: Medicare Other | Admitting: *Deleted

## 2014-02-01 ENCOUNTER — Telehealth: Payer: Self-pay | Admitting: Cardiology

## 2014-02-01 DIAGNOSIS — I442 Atrioventricular block, complete: Secondary | ICD-10-CM

## 2014-02-01 LAB — MDC_IDC_ENUM_SESS_TYPE_REMOTE
Battery Impedance: 272 Ohm
Battery Remaining Longevity: 99 mo
Brady Statistic AP VP Percent: 1 %
Brady Statistic AP VS Percent: 0 %
Brady Statistic AS VP Percent: 99 %
Date Time Interrogation Session: 20151130203927
Lead Channel Impedance Value: 425 Ohm
Lead Channel Impedance Value: 468 Ohm
Lead Channel Pacing Threshold Amplitude: 0.625 V
Lead Channel Pacing Threshold Pulse Width: 0.4 ms
Lead Channel Sensing Intrinsic Amplitude: 1 mV
Lead Channel Setting Pacing Amplitude: 2 V
Lead Channel Setting Pacing Amplitude: 2.5 V
MDC IDC MSMT BATTERY VOLTAGE: 2.79 V
MDC IDC MSMT LEADCHNL RV PACING THRESHOLD AMPLITUDE: 1.125 V
MDC IDC MSMT LEADCHNL RV PACING THRESHOLD PULSEWIDTH: 0.4 ms
MDC IDC SET LEADCHNL RV PACING PULSEWIDTH: 0.4 ms
MDC IDC SET LEADCHNL RV SENSING SENSITIVITY: 2.8 mV
MDC IDC STAT BRADY AS VS PERCENT: 0 %

## 2014-02-01 NOTE — Telephone Encounter (Signed)
LMOVM reminding pt to send remote transmission.   

## 2014-02-01 NOTE — Progress Notes (Signed)
Remote pacemaker transmission.   

## 2014-02-03 DIAGNOSIS — F329 Major depressive disorder, single episode, unspecified: Secondary | ICD-10-CM | POA: Diagnosis not present

## 2014-02-09 DIAGNOSIS — L84 Corns and callosities: Secondary | ICD-10-CM | POA: Diagnosis not present

## 2014-02-09 DIAGNOSIS — L602 Onychogryphosis: Secondary | ICD-10-CM | POA: Diagnosis not present

## 2014-02-09 DIAGNOSIS — E1159 Type 2 diabetes mellitus with other circulatory complications: Secondary | ICD-10-CM | POA: Diagnosis not present

## 2014-02-15 ENCOUNTER — Encounter: Payer: Self-pay | Admitting: Interventional Cardiology

## 2014-02-15 ENCOUNTER — Ambulatory Visit (INDEPENDENT_AMBULATORY_CARE_PROVIDER_SITE_OTHER): Payer: Medicare Other | Admitting: Interventional Cardiology

## 2014-02-15 ENCOUNTER — Ambulatory Visit (HOSPITAL_COMMUNITY): Payer: Medicare Other | Attending: Cardiology | Admitting: Cardiology

## 2014-02-15 VITALS — BP 134/72 | HR 72 | Ht 62.0 in

## 2014-02-15 DIAGNOSIS — I6310 Cerebral infarction due to embolism of unspecified precerebral artery: Secondary | ICD-10-CM | POA: Diagnosis not present

## 2014-02-15 DIAGNOSIS — I442 Atrioventricular block, complete: Secondary | ICD-10-CM | POA: Insufficient documentation

## 2014-02-15 DIAGNOSIS — E785 Hyperlipidemia, unspecified: Secondary | ICD-10-CM | POA: Insufficient documentation

## 2014-02-15 DIAGNOSIS — I10 Essential (primary) hypertension: Secondary | ICD-10-CM | POA: Insufficient documentation

## 2014-02-15 DIAGNOSIS — I358 Other nonrheumatic aortic valve disorders: Secondary | ICD-10-CM | POA: Insufficient documentation

## 2014-02-15 DIAGNOSIS — I2510 Atherosclerotic heart disease of native coronary artery without angina pectoris: Secondary | ICD-10-CM | POA: Diagnosis not present

## 2014-02-15 DIAGNOSIS — E781 Pure hyperglyceridemia: Secondary | ICD-10-CM

## 2014-02-15 DIAGNOSIS — E119 Type 2 diabetes mellitus without complications: Secondary | ICD-10-CM | POA: Diagnosis not present

## 2014-02-15 NOTE — Progress Notes (Signed)
Patient ID: Rachel Cooke, female   DOB: October 05, 1919, 80 y.o.   MRN: [id number]    [place], [address] [place], [place]  [address] Phone: [phone] Fax:  [phone]  Date:  02/15/2014   ID:  Rachel Cooke, Rachel Cooke 08/27/1919, MRN [id number]  PCP:  Estill Dooms, MD      History of Present Illness: Rachel Cooke is a 80 y.o. female who has had a pacer placed. She has a pureed diet after a stroke in 2014.  SHe had an impacted esophagus in June nad July of 2014.  She has trouble swallowing.  Minimal ankle swelling. This has resolved with compression stockings. Her BP is well controlled at home. Labs were well controlled. A1C was 6.6.She has some mild fatigue. Less SHOB with anxiety. No longer walking for 15 minutes at a time with therapy.  Small NSTEMI at time of CVA in 4/14. CAD/ASCVD:  c/o Dyspnea on exertion no change. Stands only with assistance. c/o Fatigue getting worse.  Denies : Chest pain.  Nitroglycerin.  She has lost weight with her esophageal problems.   Wt Readings from Last 3 Encounters:  01/25/14 155 lb 14.4 oz (70.716 kg)  12/28/13 154 lb (69.854 kg)  11/27/13 153 lb (69.4 kg)     Past Medical History  Diagnosis Date  . Hypertension   . Dendritic keratitis 2012  . Malignant neoplasm of breast (female), unspecified site 1970    S/P Rt radical mastectomy  . Unspecified hypothyroidism 2009  . Type II or unspecified type diabetes mellitus without mention of complication, not stated as uncontrolled 2003  . Unspecified vitamin D deficiency 2009  . Pure hyperglyceridemia 2004  . Anxiety state, unspecified   . Macular degeneration (senile) of retina, unspecified 2012  . Unspecified glaucoma 2013  . Tinnitus 2007  . Coronary atherosclerosis of native coronary artery 2003    non obstructive by Cath 2003  . Atrioventricular block, complete 2003    s/p PPM 2003, generator chnage 2011  . Allergic rhinitis due to pollen   . Acquired cyst of kidney 2002    . Osteoarthrosis, unspecified whether generalized or localized, unspecified site 2013  . Unspecified arthropathy, pelvic region and thigh 2010  . Senile osteoporosis 2003  . Abnormality of gait 2007  . Edema 2003  . Personal history of colonic polyps      s/p colonoscopy/polypectomy 2003  . Cardiac pacemaker in situ 2003  . CVA (cerebral infarction) 06/28/2012    rt hemiparesis, dysphagia  . NSTEMI (non-ST elevated myocardial infarction) 06/28/2012  . Urinary retention 07/07/2012  . Pacemaker   . Stroke 06/28/2012  . Food impaction of esophagus 08/15/2012  . Esophageal dysmotility 08/19/2012  . Acute sinus infection 11/25/2012  . Altered mental status 11/28/2012    Current Outpatient Prescriptions  Medication Sig Dispense Refill  . acetaminophen (TYLENOL) 650 MG CR tablet Take 650 mg by mouth 2 (two) times daily. For pain    . clopidogrel (PLAVIX) 75 MG tablet Take 75 mg by mouth daily. For stroke risk reduction    . diclofenac sodium (VOLTAREN) 1 % GEL Apply 2 g topically 3 (three) times daily. For pain( shoulder)    . escitalopram (LEXAPRO) 20 MG tablet Take 20 mg by mouth daily. For depression/ anxiety.    Marland Kitchen GLUCERNA (GLUCERNA) LIQD Take 237 mLs by mouth. At breakfast and lunch    . levothyroxine (SYNTHROID, LEVOTHROID) 75 MCG tablet Take 75 mcg by mouth daily before breakfast.  For thyroid therapy    . liver oil-zinc oxide (DESITIN) 40 % ointment Apply 1 application topically as needed. Apply to rash on buttox/inner thighs after each BM for rash or irritation.    Marland Kitchen losartan (COZAAR) 50 MG tablet Take 50 mg by mouth daily. For HTN    . Menthol-Zinc Oxide (CALMOSEPTINE) 0.44-20.625 % OINT Apply 1 application topically as needed. Apply to peri-anal skin and inner buttox after each incontinent episode and PRN for irritation. .    . metoprolol (LOPRESSOR) 50 MG tablet Take 50 mg by mouth 2 (two) times daily.    . Multiple Vitamin (MULTIVITAMIN) tablet Take 1 tablet by mouth daily.    .  Multiple Vitamins-Minerals (PRESERVISION AREDS PO) Take by mouth daily.    . NON FORMULARY Biotene Spray one spray before meals and at bedtime    . omeprazole (PRILOSEC) 20 MG capsule Take 20 mg by mouth daily. For GERD    . OVER THE COUNTER MEDICATION Take 1 spray by mouth 2 (two) times daily. Biotin spray    . Polyethyl Glycol-Propyl Glycol (SYSTANE) 0.4-0.3 % SOLN Apply 1 drop to eye 2 (two) times daily. One drop both eye every 2 hours as needed for dryness or irritation    . sodium chloride (OCEAN) 0.65 % nasal spray Place 1 spray into the nose 2 (two) times daily.      No current facility-administered medications for this visit.    Allergies:    Allergies  Allergen Reactions  . Lactose Intolerance (Gi)     Social History:  The patient  reports that she has never smoked. She has never used smokeless tobacco. She reports that she does not drink alcohol or use illicit drugs.   Family History:  The patient's family history includes Hypertension in her father.   ROS:  Please see the history of present illness.  No nausea, vomiting.  No fevers, chills.  No focal weakness.  No dysuria. Confusion with some medications.  Trouble swallowing.    All other systems reviewed and negative.   PHYSICAL EXAM: VS:  BP 134/72 mmHg  Pulse 72  Ht 5\' 2"  (1.575 m)  Wt  Well nourished, well developed, in no acute distress HEENT: normal Neck: no JVD, no carotid bruits Cardiac:  normal S1, S2, S4; RRR; 2/6 murmur Lungs:  clear to auscultation bilaterally, no wheezing, rhonchi or rales Abd: soft, nontender, no hepatomegaly Ext: compression stockings Skin: warm and dry Neuro:   no focal abnormalities noted Psych: normal affect     ASSESSMENT AND PLAN:  Atrioventricular block, complete   status post pacemaker.  2. Essential hypertension, benign  Continue Losartan Potassium Tablet, 50 MG, 1 tablet, Orally, Once a day Continue Metoprolol Succinate Tablet Extended Release 24 Hour, 50 MG, 1 tablet,  Orally, Once a day COntrolled at home.  3. Pure hypercholesterolemia  Continue Niaspan Tablet Extended Release, 500 MG, 2 tablets at bedtime, Orally, Once a day Continue Tricor Tablet, 145 MG, 1 tablet, Orally, Once a day; LDL 96 and TG 196 in 6/15. HbA1C 6.0 in 6.15.   4. Cardiac pacemaker in situ  Continue regular pacer checks.   F/u with me prn.    Signed, Mina Marble, MD, [facility] 02/15/2014 3:32 PM

## 2014-02-15 NOTE — Progress Notes (Signed)
Echo performed. 

## 2014-02-15 NOTE — Patient Instructions (Signed)
Dr. Varanasi will see you back on an as needed basis.  Your physician recommends that you continue on your current medications as directed. Please refer to the Current Medication list given to you today.  

## 2014-03-04 ENCOUNTER — Non-Acute Institutional Stay (SKILLED_NURSING_FACILITY): Payer: Medicare Other | Admitting: Adult Health

## 2014-03-04 ENCOUNTER — Encounter: Payer: Self-pay | Admitting: Adult Health

## 2014-03-04 DIAGNOSIS — I6310 Cerebral infarction due to embolism of unspecified precerebral artery: Secondary | ICD-10-CM | POA: Diagnosis not present

## 2014-03-04 DIAGNOSIS — E1159 Type 2 diabetes mellitus with other circulatory complications: Secondary | ICD-10-CM

## 2014-03-04 DIAGNOSIS — I442 Atrioventricular block, complete: Secondary | ICD-10-CM | POA: Diagnosis not present

## 2014-03-04 DIAGNOSIS — I10 Essential (primary) hypertension: Secondary | ICD-10-CM

## 2014-03-04 DIAGNOSIS — K224 Dyskinesia of esophagus: Secondary | ICD-10-CM

## 2014-03-04 NOTE — Assessment & Plan Note (Signed)
We discussed that she has been on pureed food for quite some times and she really misses her previous diet. She is wondering if she can advance her diet. I informed her that she would need to consult with Dr. Watt Climes and most likely have another study done for this issue. Continue current diet for now and she will contemplate returning to Dr. Watt Climes.

## 2014-03-04 NOTE — Assessment & Plan Note (Signed)
Stable on no meds, diet controlled. CBGs range 99-109.  Foot wound healed

## 2014-03-04 NOTE — Assessment & Plan Note (Signed)
Stable no change, continue current meds.

## 2014-03-04 NOTE — Assessment & Plan Note (Signed)
Has residual dysphagia and right hemiparesis. No new issues. Continues with exercise program. Continue Plavix.

## 2014-03-04 NOTE — Assessment & Plan Note (Signed)
Stable, s/p pacemaker. Recently checked by Dr. Irish Lack.

## 2014-03-04 NOTE — Progress Notes (Signed)
Patient ID: CAYA Rachel Cooke, female   DOB: 06-05-1919, 80 y.o.   MRN: [id number]   Nursing Home Location:  [place] of Service: SNF [phone])  Chief Complaint  Patient presents with  . Medical Management of Chronic Issues    HPI: This is a 80 y.o. female residing at [facility], skilled section. I am her to review her chronic medical issues. She has a hx of CVA with right sided weakness and dysphagia. She admits that her pureed diet has been difficult and she is wondering if she could advanced her diet. She has no other complaints today. Her weight is stable at 162 lbs. Her CBGs have ranged 99-109. Her VS are WNL. She recently visited Dr. Lacie Draft with Cardiology to have her pacemaker checked.  Review of Systems:  Review of Systems  Constitutional: Negative for fever, chills, diaphoresis, activity change, appetite change, fatigue and unexpected weight change.  HENT: Positive for hearing loss and trouble swallowing. Negative for congestion, ear discharge, ear pain, postnasal drip, sinus pressure, sneezing and sore throat.   Eyes: Negative.   Respiratory: Negative for cough and shortness of breath.   Cardiovascular: Negative for chest pain, palpitations and leg swelling.  Gastrointestinal: Negative for abdominal pain, diarrhea and constipation.  Genitourinary: Negative for dysuria and difficulty urinating.  Musculoskeletal: Positive for gait problem. Negative for myalgias and arthralgias.       Uses walker and scooter. Right sided weakness  Skin: Negative for color change.  Neurological: Positive for weakness. Negative for dizziness.  Psychiatric/Behavioral: Negative for behavioral problems, confusion and agitation.       Memory loss   01/29/14: BIMS 14/15, PHQ9 score 1/27  Medications: Patient's Medications  New Prescriptions   No medications on file  Previous Medications   ACETAMINOPHEN (TYLENOL) 650 MG CR TABLET    Take 650 mg by  mouth 2 (two) times daily. For pain   CAMPHOR-EUCALYPTUS-MENTHOL (VICKS VAPORUB) 4.7-1.2-2.6 % OINT    Apply 1 application topically at bedtime.   CLOPIDOGREL (PLAVIX) 75 MG TABLET    Take 75 mg by mouth daily. For stroke risk reduction   DICLOFENAC SODIUM (VOLTAREN) 1 % GEL    Apply 2 g topically 3 (three) times daily. For pain( shoulder)   ESCITALOPRAM (LEXAPRO) 20 MG TABLET    Take 20 mg by mouth daily. For depression/ anxiety.   GLUCERNA (GLUCERNA) LIQD    Take 237 mLs by mouth. At breakfast and lunch   LEVOTHYROXINE (SYNTHROID, LEVOTHROID) 75 MCG TABLET    Take 75 mcg by mouth daily before breakfast. For thyroid therapy   LIVER OIL-ZINC OXIDE (DESITIN) 40 % OINTMENT    Apply 1 application topically as needed. Apply to rash on buttox/inner thighs after each BM for rash or irritation.   LOSARTAN (COZAAR) 50 MG TABLET    Take 50 mg by mouth daily. For HTN   MENTHOL-ZINC OXIDE (CALMOSEPTINE) 0.44-20.625 % OINT    Apply 1 application topically as needed. Apply to peri-anal skin and inner buttox after each incontinent episode and PRN for irritation. Marland Kitchen   METOPROLOL (LOPRESSOR) 50 MG TABLET    Take 50 mg by mouth 2 (two) times daily.   MULTIPLE VITAMIN (MULTIVITAMIN) TABLET    Take 1 tablet by mouth daily.   MULTIPLE VITAMINS-MINERALS (PRESERVISION AREDS PO)    Take by mouth daily.   NON FORMULARY    Biotene Spray one spray before meals and at bedtime   OMEPRAZOLE (PRILOSEC) 20 MG CAPSULE  Take 20 mg by mouth daily. For GERD   OVER THE COUNTER MEDICATION    Take 1 spray by mouth 2 (two) times daily. Biotin spray   POLYETHYL GLYCOL-PROPYL GLYCOL (SYSTANE) 0.4-0.3 % SOLN    Apply 1 drop to eye 2 (two) times daily. One drop both eye every 2 hours as needed for dryness or irritation   SODIUM CHLORIDE (OCEAN) 0.65 % NASAL SPRAY    Place 1 spray into the nose 2 (two) times daily.   Modified Medications   No medications on file  Discontinued Medications   No medications on file     Physical  Exam:  Filed Vitals:   03/04/14 1017  BP: 130/77  Pulse: 84  Temp: 97.3 F (36.3 C)  Resp: 17  Weight: 162 lb 9.6 oz (73.755 kg)  SpO2: 96%    Physical Exam  Constitutional: She is oriented to person, place, and time.  Frail, elderly female  HENT:  Head: Normocephalic and atraumatic.  Right Ear: External ear normal.  Left Ear: External ear normal.  Nose: Nose normal.  Mouth/Throat: Oropharynx is clear and moist. No oropharyngeal exudate.  Eyes: Conjunctivae are normal. Pupils are equal, round, and reactive to light. Right eye exhibits no discharge. Left eye exhibits no discharge.  Neck: Neck supple. No JVD present. No tracheal deviation present. No thyromegaly present.  Cardiovascular: Normal rate and regular rhythm.   Murmur heard. Pacemaker, 2/6 sys murmur  Pulmonary/Chest: Effort normal and breath sounds normal. No respiratory distress. She has no wheezes.  Abdominal: Soft. Bowel sounds are normal. She exhibits no distension. There is no tenderness.  Lymphadenopathy:    She has no cervical adenopathy.  Neurological: She is alert and oriented to person, place, and time.  Right sided weakness  Skin: Skin is warm and dry. She is not diaphoretic.    Labs reviewed/Significant Diagnostic Results:  Basic Metabolic Panel:  Recent Labs  08/04/13 08/11/13 10/01/13  NA 142 142 140  K 4.4 4.4 3.9  BUN 13 13 13   CREATININE 0.6 0.6 0.5   Liver Function Tests:  Recent Labs  10/01/13  AST 21  ALT 13  ALKPHOS 30   No results for input(s): LIPASE, AMYLASE in the last 8760 hours. No results for input(s): AMMONIA in the last 8760 hours. CBC:  Recent Labs  10/01/13 12/20/13  WBC 6.0 8.6  HGB 11.8* 12.2  HCT 34* 38  PLT 172 189   CBG: No results for input(s): GLUCAP in the last 8760 hours. TSH:  Recent Labs  08/11/13  TSH 0.37*   A1C: Lab Results  Component Value Date   HGBA1C 6.0 08/11/2013   Lipid Panel:  Recent Labs  03/10/13 08/04/13 08/11/13  CHOL  171 155 155  HDL 41 38 38  LDLCALC 95 96 96  TRIG 177* 196* 196*   12/16/13: right second toe wound cx: few serratia, few staph  Assessment/Plan  HTN (hypertension) Stable no change, continue current meds.  Esophageal dysmotility We discussed that she has been on pureed food for quite some times and she really misses her previous diet. She is wondering if she can advance her diet. I informed her that she would need to consult with Dr. Watt Climes and most likely have another study done for this issue. Continue current diet for now and she will contemplate returning to Dr. Watt Climes.  Diabetes Stable on no meds, diet controlled. CBGs range 99-109.  Foot wound healed  Atrioventricular block, complete Stable, s/p pacemaker. Recently checked by  Dr. Irish Lack.  CVA (cerebral infarction) Has residual dysphagia and right hemiparesis. No new issues. Continues with exercise program. Continue Plavix.   TSH and CBC next draw  Cindi Carbon, [place] [phone]

## 2014-03-09 DIAGNOSIS — E0865 Diabetes mellitus due to underlying condition with hyperglycemia: Secondary | ICD-10-CM | POA: Diagnosis not present

## 2014-03-09 DIAGNOSIS — E039 Hypothyroidism, unspecified: Secondary | ICD-10-CM | POA: Diagnosis not present

## 2014-03-09 DIAGNOSIS — I119 Hypertensive heart disease without heart failure: Secondary | ICD-10-CM | POA: Diagnosis not present

## 2014-03-09 LAB — TSH: TSH: 0.47 u[IU]/mL (ref <=5.90)

## 2014-03-10 ENCOUNTER — Encounter: Payer: Self-pay | Admitting: Internal Medicine

## 2014-03-10 ENCOUNTER — Non-Acute Institutional Stay (SKILLED_NURSING_FACILITY): Payer: Medicare Other | Admitting: Internal Medicine

## 2014-03-10 DIAGNOSIS — K649 Unspecified hemorrhoids: Secondary | ICD-10-CM

## 2014-03-10 DIAGNOSIS — I69351 Hemiplegia and hemiparesis following cerebral infarction affecting right dominant side: Secondary | ICD-10-CM

## 2014-03-10 DIAGNOSIS — M2041 Other hammer toe(s) (acquired), right foot: Secondary | ICD-10-CM

## 2014-03-10 DIAGNOSIS — I69851 Hemiplegia and hemiparesis following other cerebrovascular disease affecting right dominant side: Secondary | ICD-10-CM

## 2014-03-10 NOTE — Progress Notes (Signed)
Patient ID: Rachel Cooke, female   DOB: December 13, 1919, 81 y.o.   MRN: [id number]  Location:  [facility] Provider:  Jonelle Sidle L. Mariea Clonts, D.O., C.M.D.  Code Status:  DNR  Chief Complaint  Patient presents with  . Acute Visit  . Rectal Bleeding    HPI:  81 yo white female SNF resident here at [facility] with h/o MI, htn, complete av block s/p pacemaker, esophageal dysmotility, DMII, hypothyroidism, stroke in 4/14 with right hemiparesis and dysphagia, OA, anxiety seen for acute visit due to 2 episodes of rectal bleeding.  She notes that this was bright red blood seen on the depend when she was changed.  This was also on the toilet paper when she wiped and was cleaned.  She admits she had to strain to have a BM last night and this occurred after that.  She tells me she has a history of hemorrhoids and has had this happen before.  She is not having anymore bleeding, pain, or rectal discomfort (irritation or itching) at present.    She feels she has improved remarkably from her prior stroke in 2014.  She still has weakness mostly of her right leg but her right arm now moves well.  Since then, she developed an ulceration of her right second toe which required some visit to the wound care center, but has since healed.  She continues to wear a specialized sandal on that foot to prevent rubbing against the joint.    Review of Systems:  Review of Systems  Constitutional: Negative for fever, chills and malaise/fatigue.  HENT: Negative for congestion.   Respiratory: Negative for shortness of breath.   Cardiovascular: Positive for leg swelling. Negative for chest pain and palpitations.       Right greater than left since stroke at times  Gastrointestinal: Positive for constipation and blood in stool. Negative for heartburn, nausea, vomiting, abdominal pain, diarrhea and melena.       Blood was bright red on toilet paper and depend, no large amts, no clots  Genitourinary: Negative for dysuria.    Musculoskeletal: Negative for falls.  Skin: Negative for rash.  Neurological: Negative for dizziness, loss of consciousness and weakness.       Chronic right leg weakness s/p cva  Psychiatric/Behavioral: Negative for depression and memory loss. The patient is not nervous/anxious.        Mood is good now    Medications: Patient's Medications  New Prescriptions   No medications on file  Previous Medications   ACETAMINOPHEN (TYLENOL) 650 MG CR TABLET    Take 650 mg by mouth 2 (two) times daily. For pain   CAMPHOR-EUCALYPTUS-MENTHOL (VICKS VAPORUB) 4.7-1.2-2.6 % OINT    Apply 1 application topically at bedtime.   CLOPIDOGREL (PLAVIX) 75 MG TABLET    Take 75 mg by mouth daily. For stroke risk reduction   DICLOFENAC SODIUM (VOLTAREN) 1 % GEL    Apply 2 g topically 3 (three) times daily. For pain( shoulder)   ESCITALOPRAM (LEXAPRO) 20 MG TABLET    Take 20 mg by mouth daily. For depression/ anxiety.   GLUCERNA (GLUCERNA) LIQD    Take 237 mLs by mouth. At breakfast and lunch   LEVOTHYROXINE (SYNTHROID, LEVOTHROID) 75 MCG TABLET    Take 75 mcg by mouth daily before breakfast. For thyroid therapy   LIVER OIL-ZINC OXIDE (DESITIN) 40 % OINTMENT    Apply 1 application topically as needed. Apply to rash on buttox/inner thighs after each BM for rash or irritation.  LOSARTAN (COZAAR) 50 MG TABLET    Take 50 mg by mouth daily. For HTN   MENTHOL-ZINC OXIDE (CALMOSEPTINE) 0.44-20.625 % OINT    Apply 1 application topically as needed. Apply to peri-anal skin and inner buttox after each incontinent episode and PRN for irritation. Marland Kitchen   METOPROLOL (LOPRESSOR) 50 MG TABLET    Take 50 mg by mouth 2 (two) times daily.   MULTIPLE VITAMIN (MULTIVITAMIN) TABLET    Take 1 tablet by mouth daily.   MULTIPLE VITAMINS-MINERALS (PRESERVISION AREDS PO)    Take by mouth daily.   NON FORMULARY    Biotene Spray one spray before meals and at bedtime   OMEPRAZOLE (PRILOSEC) 20 MG CAPSULE    Take 20 mg by mouth daily. For GERD    OVER THE COUNTER MEDICATION    Take 1 spray by mouth 2 (two) times daily. Biotin spray   POLYETHYL GLYCOL-PROPYL GLYCOL (SYSTANE) 0.4-0.3 % SOLN    Apply 1 drop to eye 2 (two) times daily. One drop both eye every 2 hours as needed for dryness or irritation   SODIUM CHLORIDE (OCEAN) 0.65 % NASAL SPRAY    Place 1 spray into the nose 2 (two) times daily.   Modified Medications   No medications on file  Discontinued Medications   No medications on file    Physical Exam: Filed Vitals:   03/10/14 1201  BP: 130/77  Pulse: 65  Temp: 97.3 F (36.3 C)  Resp: 17  Height: 5\' 1"  (1.549 m)  Weight: 156 lb 9.6 oz (71.033 kg)  SpO2: 96%  Physical Exam  Constitutional: She is oriented to person, place, and time. She appears well-developed and well-nourished. No distress.  Cardiovascular: Normal rate, regular rhythm, normal heart sounds and intact distal pulses.   Pulmonary/Chest: Effort normal and breath sounds normal. No respiratory distress.  Abdominal: Soft. Bowel sounds are normal. She exhibits no distension and no mass. There is no tenderness. There is no rebound and no guarding. No hernia.  Genitourinary:  hemorrhoids present; no blood seen  Musculoskeletal:  4+ strength RLE, remainder 5/5  Neurological: She is alert and oriented to person, place, and time.  Skin: Skin is warm and dry.  Right second hammer toe with scar from prior ulceration; wearing specialized sandal and sock beneath  Psychiatric: She has a normal mood and affect.    Labs reviewed: Basic Metabolic Panel:  Recent Labs  08/04/13 08/11/13 10/01/13  NA 142 142 140  K 4.4 4.4 3.9  BUN 13 13 13   CREATININE 0.6 0.6 0.5    Liver Function Tests:  Recent Labs  10/01/13  AST 21  ALT 13  ALKPHOS 30    CBC:  Recent Labs  10/01/13 12/20/13  WBC 6.0 8.6  HGB 11.8* 12.2  HCT 34* 38  PLT 172 189   Assessment/Plan 1. Bleeding hemorrhoids -admits to having these in the past, as well -if bleeding recurs, she  will require f/u labs, but she is no longer having bleeding, and does have hemorrhoids present -cont to monitor -constipation was isolated event so does not want med changes for this and also did not want anusol cream or suppositories at this point, but these can be ordered if she needs them in the future  2. Hammer toe of right foot -cont to use specialized shoe to avoid pressure on hammertoe where she previously developed cellulitis  3. Hemiparesis affecting right side as late effect of cerebrovascular accident -has made great strides since her stroke in  2014, still with some residual weakness of her right leg  Family/ staff Communication: discussed with nursing and resident  Goals of care: long term care, DNR code status  Labs/tests ordered:  None at present--would check cbc if bleeding recurs

## 2014-03-19 ENCOUNTER — Encounter: Payer: Self-pay | Admitting: Cardiology

## 2014-03-20 NOTE — Progress Notes (Signed)
Patient ID: Rachel Cooke, female   DOB: [birth year], 81 y.o.   MRN: [id number] Spoke with pt's daughter, Rachel Cooke, for 30 minutes discussing primarily her mother's dysphagia.  She wants it to be noted that her mother is not to be receiving any foods that are pureed here at [facility] on site (not the exact right consistency) only the baby food, soups and pudding from [employer] plus [place].  Her mother has had two previous "backups" one on mechanical soft diet 3 mos post-stroke and another on pureed veggies and chicken here---this required hospitalization.  She is aware that her mother is very bored with her limited diet.  She says she has been reassessed on two occasions and does not require reassessment at this time b/c this only gets her hopes up and leads to disappointment.  Dr. Cristina Gong and Dr. Jackquline Bosch as well as Dr. Paulita Fujita have all said her diet needs to remain as it is.    I reviewed Rachel Cooke's EGD from 12/14/12 by Dr. Paulita Fujita that revealed hiatal hernia, schatzi's ring, and recommended liquid and fine puree diet only.  She is on a baby food, lactose-freed diet.  Fluids with meals, pureed eggs at breakfast, lactose free vanilla ice cream.  She gets omeprazole 20mg  each am.    Her weight has been fairly stable and she will now be weighed just monthly.    Rachel Cooke also points out that there is a fhx of colon cancer in pt's father who was deceased from colon ca at 74 and pt's sister.  Pt herself had cscope when she was 25 or 81 yo which showed no polyps and decision was made not to have any further studies unless a problem developed.  We reviewed her hemorrhoidal bleeding from last week and she agreed with no further interventions on that.

## 2014-03-24 ENCOUNTER — Encounter: Payer: Self-pay | Admitting: Internal Medicine

## 2014-04-01 DIAGNOSIS — I119 Hypertensive heart disease without heart failure: Secondary | ICD-10-CM | POA: Diagnosis not present

## 2014-04-01 DIAGNOSIS — E039 Hypothyroidism, unspecified: Secondary | ICD-10-CM | POA: Diagnosis not present

## 2014-04-01 LAB — BASIC METABOLIC PANEL
BUN: 15 mg/dL (ref 4–21)
Creatinine: 0.5 mg/dL (ref 0.5–1.1)
Glucose: 85 mg/dL
POTASSIUM: 4 mmol/L (ref 3.4–5.3)
SODIUM: 141 mmol/L (ref 137–147)

## 2014-04-01 LAB — CBC AND DIFFERENTIAL
HCT: 35 % — AB (ref 36–46)
Hemoglobin: 11.6 g/dL — AB (ref 12.0–16.0)
PLATELETS: 171 10*3/uL (ref 150–399)
WBC: 6.1 10*3/mL

## 2014-04-08 ENCOUNTER — Non-Acute Institutional Stay (SKILLED_NURSING_FACILITY): Payer: Medicare Other | Admitting: Adult Health

## 2014-04-08 DIAGNOSIS — I6310 Cerebral infarction due to embolism of unspecified precerebral artery: Secondary | ICD-10-CM

## 2014-04-08 DIAGNOSIS — I69921 Dysphasia following unspecified cerebrovascular disease: Secondary | ICD-10-CM | POA: Diagnosis not present

## 2014-04-08 DIAGNOSIS — F411 Generalized anxiety disorder: Secondary | ICD-10-CM

## 2014-04-08 DIAGNOSIS — K224 Dyskinesia of esophagus: Secondary | ICD-10-CM | POA: Diagnosis not present

## 2014-04-08 DIAGNOSIS — E038 Other specified hypothyroidism: Secondary | ICD-10-CM | POA: Diagnosis not present

## 2014-04-08 DIAGNOSIS — I69391 Dysphagia following cerebral infarction: Secondary | ICD-10-CM

## 2014-04-08 DIAGNOSIS — E1159 Type 2 diabetes mellitus with other circulatory complications: Secondary | ICD-10-CM | POA: Diagnosis not present

## 2014-04-08 DIAGNOSIS — M1990 Unspecified osteoarthritis, unspecified site: Secondary | ICD-10-CM

## 2014-04-08 DIAGNOSIS — I10 Essential (primary) hypertension: Secondary | ICD-10-CM

## 2014-04-08 DIAGNOSIS — G4700 Insomnia, unspecified: Secondary | ICD-10-CM | POA: Diagnosis not present

## 2014-04-09 ENCOUNTER — Encounter: Payer: Self-pay | Admitting: Adult Health

## 2014-04-09 DIAGNOSIS — G4700 Insomnia, unspecified: Secondary | ICD-10-CM | POA: Insufficient documentation

## 2014-04-09 NOTE — Progress Notes (Signed)
Patient ID: Rachel Cooke, female   DOB: [birth year], 81 y.o.   MRN: [id number]   Nursing Home Location:  [place] of Service: SNF [phone])  Chief Complaint  Patient presents with  . Medical Management of Chronic Issues    HPI: This is a 81 y.o. female residing at [facility], skilled section. I am her to review her chronic medical issues. She has a hx of CVA with right sided weakness, esophageal dysmotility, and dysphagia.  She continues to express discontentment with her current diet of pureed food. Her daughter is with her and states that Dr. Watt Climes recommended that she continue this diet long term because of her hx of food impaction. The staff is working on variety in her diet. Consistency with the food is important so they are buying food from outside sources, rather than the kitchen to ensure that the food is the right consistency and that she will be able to pass it through her esophagus with out difficulty. Her weight is variable in the records, overall stable, at 160 lbs.  The resident also expresses that she has periods of sadness and sleepless night regarding her loss of independence. She is very aware of the fact that she has to eat baby food, wear a diaper, and call for help with any mobility and this brings her frustration and sadness.   Review of Systems:  Review of Systems  Constitutional: Negative for fever, chills, diaphoresis, activity change, appetite change, fatigue and unexpected weight change.  HENT: Positive for hearing loss and trouble swallowing. Negative for congestion, ear discharge, ear pain, postnasal drip, sinus pressure, sneezing and sore throat.   Eyes: Negative.   Respiratory: Negative for cough and shortness of breath.   Cardiovascular: Negative for chest pain, palpitations and leg swelling.  Gastrointestinal: Negative for abdominal pain, diarrhea and constipation.  Genitourinary: Negative for dysuria and  difficulty urinating.  Musculoskeletal: Positive for gait problem. Negative for myalgias and arthralgias.       Uses walker and scooter. Right sided weakness  Skin: Negative for color change.  Neurological: Positive for weakness. Negative for dizziness.  Psychiatric/Behavioral: Positive for sleep disturbance. Negative for behavioral problems, confusion and agitation. The patient is nervous/anxious.        Memory loss   01/29/14: BIMS 14/15, PHQ9 score 1/27  Medications: Patient's Medications  New Prescriptions   No medications on file  Previous Medications   ACETAMINOPHEN (TYLENOL) 650 MG CR TABLET    Take 650 mg by mouth 2 (two) times daily. For pain   CAMPHOR-EUCALYPTUS-MENTHOL (VICKS VAPORUB) 4.7-1.2-2.6 % OINT    Apply 1 application topically at bedtime.   CLOPIDOGREL (PLAVIX) 75 MG TABLET    Take 75 mg by mouth daily. For stroke risk reduction   DICLOFENAC SODIUM (VOLTAREN) 1 % GEL    Apply 2 g topically 3 (three) times daily. For pain( shoulder)   ESCITALOPRAM (LEXAPRO) 20 MG TABLET    Take 20 mg by mouth daily. For depression/ anxiety.   GLUCERNA (GLUCERNA) LIQD    Take 237 mLs by mouth. At breakfast and lunch   LEVOTHYROXINE (SYNTHROID, LEVOTHROID) 75 MCG TABLET    Take 75 mcg by mouth daily before breakfast. For thyroid therapy   LIVER OIL-ZINC OXIDE (DESITIN) 40 % OINTMENT    Apply 1 application topically as needed. Apply to rash on buttox/inner thighs after each BM for rash or irritation.   LOSARTAN (COZAAR) 50 MG TABLET    Take 50 mg  by mouth daily. For HTN   MELATONIN 5 MG TABS    Take 5 tablets by mouth at bedtime.   MENTHOL-ZINC OXIDE (CALMOSEPTINE) 0.44-20.625 % OINT    Apply 1 application topically as needed. Apply to peri-anal skin and inner buttox after each incontinent episode and PRN for irritation. Marland Kitchen   METOPROLOL (LOPRESSOR) 50 MG TABLET    Take 50 mg by mouth 2 (two) times daily.   MULTIPLE VITAMIN (MULTIVITAMIN) TABLET    Take 1 tablet by mouth daily.   MULTIPLE  VITAMINS-MINERALS (PRESERVISION AREDS PO)    Take by mouth daily.   NON FORMULARY    Biotene Spray one spray before meals and at bedtime   OMEPRAZOLE (PRILOSEC) 20 MG CAPSULE    Take 20 mg by mouth daily. For GERD   OVER THE COUNTER MEDICATION    Take 1 spray by mouth 2 (two) times daily. Biotin spray   POLYETHYL GLYCOL-PROPYL GLYCOL (SYSTANE) 0.4-0.3 % SOLN    Apply 1 drop to eye 2 (two) times daily. One drop both eye every 2 hours as needed for dryness or irritation   SODIUM CHLORIDE (OCEAN) 0.65 % NASAL SPRAY    Place 1 spray into the nose 2 (two) times daily.   Modified Medications   No medications on file  Discontinued Medications   No medications on file     Physical Exam:  Filed Vitals:   04/09/14 0904  BP: 116/67  Pulse: 83  Temp: 97.1 F (36.2 C)  Resp: 18  Weight: 160 lb (72.576 kg)  SpO2: 98%    Physical Exam  Constitutional: She is oriented to person, place, and time.  Frail, elderly female  HENT:  Head: Normocephalic and atraumatic.  Right Ear: External ear normal.  Left Ear: External ear normal.  Nose: Nose normal.  Mouth/Throat: Oropharynx is clear and moist. No oropharyngeal exudate.  Eyes: Conjunctivae are normal. Pupils are equal, round, and reactive to light. Right eye exhibits no discharge. Left eye exhibits no discharge.  Neck: Neck supple. No JVD present. No tracheal deviation present. No thyromegaly present.  Cardiovascular: Normal rate and regular rhythm.   Murmur heard. Pacemaker, 2/6 sys murmur. Trace edema to both legs  Pulmonary/Chest: Effort normal and breath sounds normal. No respiratory distress. She has no wheezes.  Abdominal: Soft. Bowel sounds are normal. She exhibits no distension. There is no tenderness.  Lymphadenopathy:    She has no cervical adenopathy.  Neurological: She is alert and oriented to person, place, and time.  Right sided weakness  Skin: Skin is warm and dry. She is not diaphoretic.    Labs reviewed/Significant  Diagnostic Results:  Basic Metabolic Panel:  Recent Labs  08/11/13 10/01/13 04/01/14  NA 142 140 141  K 4.4 3.9 4.0  BUN 13 13 15   CREATININE 0.6 0.5 0.5   Liver Function Tests:  Recent Labs  10/01/13  AST 21  ALT 13  ALKPHOS 30   No results for input(s): LIPASE, AMYLASE in the last 8760 hours. No results for input(s): AMMONIA in the last 8760 hours. CBC:  Recent Labs  10/01/13 12/20/13 04/01/14  WBC 6.0 8.6 6.1  HGB 11.8* 12.2 11.6*  HCT 34* 38 35*  PLT 172 189 171   CBG: No results for input(s): GLUCAP in the last 8760 hours. TSH:  Recent Labs  08/11/13 03/09/14  TSH 0.37* 0.47   A1C: Lab Results  Component Value Date   HGBA1C 6.0 08/11/2013   Lipid Panel:  Recent Labs  08/04/13 08/11/13  CHOL 155 155  HDL 38 38  LDLCALC 96 96  TRIG 196* 196*   12/16/13: right second toe wound cx: few serratia, few staph  Assessment/Plan  1. Dysphagia due to recent stroke Continue current diet rec of baby food and glucerna for nutritional supplement  2. Type 2 diabetes mellitus with other circulatory complications Controlled with diet. CBGs range 102-110.   3. Cerebral infarction due to embolism of precerebral artery Continue Plavix, no signs of bleeding.   4. Osteoarthritis, unspecified osteoarthritis type, unspecified site Controlled with scheduled Tylenol and voltaren gel.  5. Other specified hypothyroidism Replenished on Synthroid.  6. Anxiety state Some periods of anxiety, related to her situation. Continue current meds at this time, family not interested in med changes due to side effects unless absolutely necessary  7. Insomnia Begin Melatonin 5mg  qhs  8. Esophageal dysmotility No new issues with impaction. Continue diet and Prilosec qd.  9. HTN Controlled with Cozaar, BMP WNL  Cindi Carbon, ANP [facility] [phone]

## 2014-04-27 DIAGNOSIS — H43812 Vitreous degeneration, left eye: Secondary | ICD-10-CM | POA: Diagnosis not present

## 2014-04-27 DIAGNOSIS — H3531 Nonexudative age-related macular degeneration: Secondary | ICD-10-CM | POA: Diagnosis not present

## 2014-05-04 ENCOUNTER — Ambulatory Visit (INDEPENDENT_AMBULATORY_CARE_PROVIDER_SITE_OTHER): Payer: Medicare Other | Admitting: *Deleted

## 2014-05-04 DIAGNOSIS — I442 Atrioventricular block, complete: Secondary | ICD-10-CM

## 2014-05-04 LAB — MDC_IDC_ENUM_SESS_TYPE_REMOTE
Battery Remaining Longevity: 98 mo
Brady Statistic AP VP Percent: 1 %
Brady Statistic AP VS Percent: 0 %
Brady Statistic AS VP Percent: 99 %
Brady Statistic AS VS Percent: 0 %
Date Time Interrogation Session: 20160301165801
Lead Channel Impedance Value: 416 Ohm
Lead Channel Impedance Value: 473 Ohm
Lead Channel Pacing Threshold Amplitude: 0.625 V
Lead Channel Pacing Threshold Amplitude: 1.125 V
Lead Channel Setting Sensing Sensitivity: 2.8 mV
MDC IDC MSMT BATTERY IMPEDANCE: 272 Ohm
MDC IDC MSMT BATTERY VOLTAGE: 2.79 V
MDC IDC MSMT LEADCHNL RA PACING THRESHOLD PULSEWIDTH: 0.4 ms
MDC IDC MSMT LEADCHNL RA SENSING INTR AMPL: 1 mV
MDC IDC MSMT LEADCHNL RV PACING THRESHOLD PULSEWIDTH: 0.4 ms
MDC IDC SET LEADCHNL RA PACING AMPLITUDE: 2 V
MDC IDC SET LEADCHNL RV PACING AMPLITUDE: 2.5 V
MDC IDC SET LEADCHNL RV PACING PULSEWIDTH: 0.4 ms

## 2014-05-04 NOTE — Progress Notes (Signed)
Remote pacemaker transmission.   

## 2014-05-10 ENCOUNTER — Non-Acute Institutional Stay (SKILLED_NURSING_FACILITY): Payer: Medicare Other | Admitting: Adult Health

## 2014-05-10 DIAGNOSIS — K640 First degree hemorrhoids: Secondary | ICD-10-CM | POA: Diagnosis not present

## 2014-05-10 DIAGNOSIS — G4700 Insomnia, unspecified: Secondary | ICD-10-CM | POA: Diagnosis not present

## 2014-05-10 DIAGNOSIS — I69391 Dysphagia following cerebral infarction: Secondary | ICD-10-CM

## 2014-05-10 DIAGNOSIS — I6310 Cerebral infarction due to embolism of unspecified precerebral artery: Secondary | ICD-10-CM

## 2014-05-10 DIAGNOSIS — I69921 Dysphasia following unspecified cerebrovascular disease: Secondary | ICD-10-CM

## 2014-05-10 DIAGNOSIS — K649 Unspecified hemorrhoids: Secondary | ICD-10-CM | POA: Insufficient documentation

## 2014-05-10 DIAGNOSIS — I10 Essential (primary) hypertension: Secondary | ICD-10-CM | POA: Diagnosis not present

## 2014-05-10 DIAGNOSIS — E1159 Type 2 diabetes mellitus with other circulatory complications: Secondary | ICD-10-CM | POA: Diagnosis not present

## 2014-05-10 NOTE — Progress Notes (Signed)
Patient ID: Rachel Cooke, female   DOB: [birth year], 81 y.o.   MRN: [id number]   Nursing Home Location:  [place] of Service: SNF [phone])  Chief Complaint  Patient presents with  . Acute Visit    hemorrhoids  . Medical Management of Chronic Issues    HPI: This is a 81 y.o. female residing at [facility], skilled section.She has a hx of CVA with right sided weakness, esophageal dysmotility with food impaction, HTN, complete heart block s/p pacemaker, and dysphagia. I was asked to see her today for complaints of hemorrhoids, which have been burning for 1 week. She reports regular BM's and denies abd pain. Over all her weight is stable at 162 lbs. She has no other complaints.    Review of Systems:  Review of Systems  Constitutional: Negative for fever, chills, diaphoresis, activity change, appetite change, fatigue and unexpected weight change.  HENT: Positive for hearing loss and trouble swallowing. Negative for congestion, ear discharge, ear pain, postnasal drip, sinus pressure, sneezing and sore throat.   Eyes: Negative.   Respiratory: Negative for cough and shortness of breath.   Cardiovascular: Negative for chest pain, palpitations and leg swelling.  Gastrointestinal: Positive for rectal pain. Negative for abdominal pain, diarrhea and constipation.  Genitourinary: Negative for dysuria and difficulty urinating.  Musculoskeletal: Positive for gait problem. Negative for myalgias and arthralgias.       Uses walker and scooter. Right sided weakness  Skin: Negative for color change.  Neurological: Positive for weakness. Negative for dizziness.  Psychiatric/Behavioral: Positive for sleep disturbance. Negative for behavioral problems, confusion and agitation. The patient is nervous/anxious.        Memory loss   01/29/14: BIMS 14/15, PHQ9 score 1/27  Medications: Patient's Medications  New Prescriptions   No medications on file  Previous  Medications   ACETAMINOPHEN (TYLENOL) 650 MG CR TABLET    Take 650 mg by mouth 2 (two) times daily. For pain   CAMPHOR-EUCALYPTUS-MENTHOL (VICKS VAPORUB) 4.7-1.2-2.6 % OINT    Apply 1 application topically at bedtime.   CLOPIDOGREL (PLAVIX) 75 MG TABLET    Take 75 mg by mouth daily. For stroke risk reduction   DICLOFENAC SODIUM (VOLTAREN) 1 % GEL    Apply 2 g topically 3 (three) times daily. For pain( shoulder)   ESCITALOPRAM (LEXAPRO) 20 MG TABLET    Take 20 mg by mouth daily. For depression/ anxiety.   GLUCERNA (GLUCERNA) LIQD    Take 237 mLs by mouth. At breakfast and lunch   LEVOTHYROXINE (SYNTHROID, LEVOTHROID) 75 MCG TABLET    Take 75 mcg by mouth daily before breakfast. For thyroid therapy   LIVER OIL-ZINC OXIDE (DESITIN) 40 % OINTMENT    Apply 1 application topically as needed. Apply to rash on buttox/inner thighs after each BM for rash or irritation.   LOSARTAN (COZAAR) 50 MG TABLET    Take 50 mg by mouth daily. For HTN   MELATONIN 5 MG TABS    Take 5 tablets by mouth at bedtime.   MENTHOL-ZINC OXIDE (CALMOSEPTINE) 0.44-20.625 % OINT    Apply 1 application topically as needed. Apply to peri-anal skin and inner buttox after each incontinent episode and PRN for irritation. Marland Kitchen   METOPROLOL (LOPRESSOR) 50 MG TABLET    Take 50 mg by mouth 2 (two) times daily.   MULTIPLE VITAMIN (MULTIVITAMIN) TABLET    Take 1 tablet by mouth daily.   MULTIPLE VITAMINS-MINERALS (PRESERVISION AREDS PO)    Take  by mouth daily.   NON FORMULARY    Biotene Spray one spray before meals and at bedtime   OMEPRAZOLE (PRILOSEC) 20 MG CAPSULE    Take 20 mg by mouth daily. For GERD   OVER THE COUNTER MEDICATION    Take 1 spray by mouth 2 (two) times daily. Biotin spray   POLYETHYL GLYCOL-PROPYL GLYCOL (SYSTANE) 0.4-0.3 % SOLN    Apply 1 drop to eye 2 (two) times daily. One drop both eye every 2 hours as needed for dryness or irritation   SODIUM CHLORIDE (OCEAN) 0.65 % NASAL SPRAY    Place 1 spray into the nose 2 (two)  times daily.   Modified Medications   No medications on file  Discontinued Medications   No medications on file     Physical Exam:  Filed Vitals:   05/10/14 1433  BP: 99/61  Pulse: 86  Temp: 98 F (36.7 C)  Resp: 18  Weight: 162 lb 12.8 oz (73.846 kg)  SpO2: 97%    Physical Exam  Constitutional: She is oriented to person, place, and time.  Frail, elderly female  HENT:  Head: Normocephalic and atraumatic.  Right Ear: External ear normal.  Left Ear: External ear normal.  Nose: Nose normal.  Mouth/Throat: Oropharynx is clear and moist. No oropharyngeal exudate.  Eyes: Conjunctivae are normal. Pupils are equal, round, and reactive to light. Right eye exhibits no discharge. Left eye exhibits no discharge.  Neck: Neck supple. No JVD present. No tracheal deviation present. No thyromegaly present.  Cardiovascular: Normal rate and regular rhythm.   Murmur heard. Pacemaker, 2/6 sys murmur. Trace edema to both legs  Pulmonary/Chest: Effort normal and breath sounds normal. No respiratory distress. She has no wheezes.  Abdominal: Soft. Bowel sounds are normal. She exhibits no distension. There is no tenderness.  Lymphadenopathy:    She has no cervical adenopathy.  Neurological: She is alert and oriented to person, place, and time.  Right sided weakness  Skin: Skin is warm and dry. She is not diaphoretic.    Labs reviewed/Significant Diagnostic Results:  Basic Metabolic Panel:  Recent Labs  08/11/13 10/01/13 04/01/14  NA 142 140 141  K 4.4 3.9 4.0  BUN 13 13 15   CREATININE 0.6 0.5 0.5   Liver Function Tests:  Recent Labs  10/01/13  AST 21  ALT 13  ALKPHOS 30   No results for input(s): LIPASE, AMYLASE in the last 8760 hours. No results for input(s): AMMONIA in the last 8760 hours. CBC:  Recent Labs  10/01/13 12/20/13 04/01/14  WBC 6.0 8.6 6.1  HGB 11.8* 12.2 11.6*  HCT 34* 38 35*  PLT 172 189 171   CBG: No results for input(s): GLUCAP in the last 8760  hours. TSH:  Recent Labs  08/11/13 03/09/14  TSH 0.37* 0.47   A1C: Lab Results  Component Value Date   HGBA1C 6.0 08/11/2013   Lipid Panel:  Recent Labs  08/04/13 08/11/13  CHOL 155 155  HDL 38 38  LDLCALC 96 96  TRIG 196* 196*   12/16/13: right second toe wound cx: few serratia, few staph  Assessment/Plan  1. First degree hemorrhoids Anusol HC cream BID for 1 week  2. Essential hypertension BP controlled. BMP WNL. Continue current meds.  3. Dysphagia due to recent stroke Tolerating puree foods, no reports of aspiration or impaction, continue current diet.  4. Type 2 diabetes mellitus with other circulatory complications Stable, not on meds  5. Cerebral infarction due to embolism of precerebral  artery No new events. Tolerating Plavix. Continue and monitor  6. Insomnia Improved with Melatonin   Cindi Carbon, ANP [facility] [phone]

## 2014-05-11 DIAGNOSIS — E1159 Type 2 diabetes mellitus with other circulatory complications: Secondary | ICD-10-CM | POA: Diagnosis not present

## 2014-05-11 DIAGNOSIS — L84 Corns and callosities: Secondary | ICD-10-CM | POA: Diagnosis not present

## 2014-05-11 DIAGNOSIS — L602 Onychogryphosis: Secondary | ICD-10-CM | POA: Diagnosis not present

## 2014-05-18 DIAGNOSIS — E039 Hypothyroidism, unspecified: Secondary | ICD-10-CM | POA: Diagnosis not present

## 2014-05-18 DIAGNOSIS — I119 Hypertensive heart disease without heart failure: Secondary | ICD-10-CM | POA: Diagnosis not present

## 2014-05-18 DIAGNOSIS — F329 Major depressive disorder, single episode, unspecified: Secondary | ICD-10-CM | POA: Diagnosis not present

## 2014-05-18 DIAGNOSIS — E1159 Type 2 diabetes mellitus with other circulatory complications: Secondary | ICD-10-CM | POA: Diagnosis not present

## 2014-05-21 DIAGNOSIS — N189 Chronic kidney disease, unspecified: Secondary | ICD-10-CM | POA: Diagnosis not present

## 2014-05-21 DIAGNOSIS — R300 Dysuria: Secondary | ICD-10-CM | POA: Diagnosis not present

## 2014-06-08 ENCOUNTER — Encounter: Payer: Self-pay | Admitting: Cardiology

## 2014-06-09 ENCOUNTER — Non-Acute Institutional Stay (SKILLED_NURSING_FACILITY): Payer: Medicare Other | Admitting: Internal Medicine

## 2014-06-09 DIAGNOSIS — I69921 Dysphasia following unspecified cerebrovascular disease: Secondary | ICD-10-CM | POA: Diagnosis not present

## 2014-06-09 DIAGNOSIS — I10 Essential (primary) hypertension: Secondary | ICD-10-CM | POA: Diagnosis not present

## 2014-06-09 DIAGNOSIS — G4700 Insomnia, unspecified: Secondary | ICD-10-CM

## 2014-06-09 DIAGNOSIS — I6310 Cerebral infarction due to embolism of unspecified precerebral artery: Secondary | ICD-10-CM

## 2014-06-09 DIAGNOSIS — I69391 Dysphagia following cerebral infarction: Secondary | ICD-10-CM

## 2014-06-09 DIAGNOSIS — E1159 Type 2 diabetes mellitus with other circulatory complications: Secondary | ICD-10-CM | POA: Diagnosis not present

## 2014-06-09 DIAGNOSIS — E038 Other specified hypothyroidism: Secondary | ICD-10-CM | POA: Diagnosis not present

## 2014-06-09 NOTE — Progress Notes (Signed)
Patient ID: Rachel Cooke, female   DOB: Dec 10, 1919, 81 y.o.   MRN: [id number]  Location:  [facility] Provider:  Jonelle Sidle L. Mariea Clonts, D.O., C.M.D.  Code Status:  DNR Advanced Directive information Does patient have an advance directive?: Yes, Type of Advance Directive: Living will;Out of facility DNR (pink MOST or yellow form);Healthcare Power of Attorney, Pre-existing out of facility DNR order (yellow form or pink MOST form): Yellow form placed in chart (order not valid for inpatient use)   Chief Complaint  Patient presents with  . Medical Management of Chronic Issues    HPI:  81 yo female long term care resident seen for medical mgt of her chronic diseases.  She has a h/o hemorrhoids, right hammer toe, DMII with circulatory complications, complete AV block, right hemiplegia, amnesia, primary OA, esophageal dyskinesia, acquired folicularis keratosis, dysphagia, htn, hyperlipidemia, major depressive disorder, and breast cancer.    Hemorrhoids have improved.   BPs reviewed and ok.   She is on a very specific pureed diet at her daughter's request and doing well with this. She is sleeping well  Review of Systems:  Review of Systems  Constitutional: Negative for fever, chills and malaise/fatigue.  HENT: Negative for congestion.   Respiratory: Negative for shortness of breath.   Cardiovascular: Positive for leg swelling. Negative for chest pain and palpitations.  Gastrointestinal: Positive for blood in stool. Negative for abdominal pain and melena.  Genitourinary: Negative for dysuria.  Musculoskeletal: Positive for joint pain. Negative for falls.  Skin: Negative for rash.  Neurological: Positive for focal weakness.       Dysphagia  Psychiatric/Behavioral: Negative for depression.    Medications: Patient's Medications  New Prescriptions   No medications on file  Previous Medications   ACETAMINOPHEN (TYLENOL) 650 MG CR TABLET    Take 650 mg by mouth 2 (two) times daily. For  pain   CAMPHOR-EUCALYPTUS-MENTHOL (VICKS VAPORUB) 4.7-1.2-2.6 % OINT    Apply 1 application topically at bedtime.   CLOPIDOGREL (PLAVIX) 75 MG TABLET    Take 75 mg by mouth daily. For stroke risk reduction   DICLOFENAC SODIUM (VOLTAREN) 1 % GEL    Apply 2 g topically 3 (three) times daily. For pain( shoulder)   ESCITALOPRAM (LEXAPRO) 20 MG TABLET    Take 20 mg by mouth daily. For depression/ anxiety.   GLUCERNA (GLUCERNA) LIQD    Take 237 mLs by mouth. At breakfast and lunch   LEVOTHYROXINE (SYNTHROID, LEVOTHROID) 75 MCG TABLET    Take 75 mcg by mouth daily before breakfast. For thyroid therapy   LIVER OIL-ZINC OXIDE (DESITIN) 40 % OINTMENT    Apply 1 application topically as needed. Apply to rash on buttox/inner thighs after each BM for rash or irritation.   LOSARTAN (COZAAR) 50 MG TABLET    Take 50 mg by mouth daily. For HTN   MELATONIN 5 MG TABS    Take 5 tablets by mouth at bedtime.   MENTHOL-ZINC OXIDE (CALMOSEPTINE) 0.44-20.625 % OINT    Apply 1 application topically as needed. Apply to peri-anal skin and inner buttox after each incontinent episode and PRN for irritation. Marland Kitchen   METOPROLOL (LOPRESSOR) 50 MG TABLET    Take 50 mg by mouth 2 (two) times daily.   MULTIPLE VITAMIN (MULTIVITAMIN) TABLET    Take 1 tablet by mouth daily.   MULTIPLE VITAMINS-MINERALS (PRESERVISION AREDS PO)    Take by mouth daily.   NON FORMULARY    Biotene Spray one spray before meals and at  bedtime   OMEPRAZOLE (PRILOSEC) 20 MG CAPSULE    Take 20 mg by mouth daily. For GERD   OVER THE COUNTER MEDICATION    Take 1 spray by mouth 2 (two) times daily. Biotin spray   POLYETHYL GLYCOL-PROPYL GLYCOL (SYSTANE) 0.4-0.3 % SOLN    Apply 1 drop to eye 2 (two) times daily. One drop both eye every 2 hours as needed for dryness or irritation   SODIUM CHLORIDE (OCEAN) 0.65 % NASAL SPRAY    Place 1 spray into the nose 2 (two) times daily.   Modified Medications   No medications on file  Discontinued Medications   No medications  on file    Physical Exam: Filed Vitals:   06/09/14 1826  BP: 124/71  Pulse: 86  Temp: 97.5 F (36.4 C)  Resp: 18  Height: 5\' 1"  (1.549 m)  Weight: 163 lb 12.8 oz (74.299 kg)  SpO2: 96%  Physical Exam  Constitutional: She is oriented to person, place, and time. She appears well-developed and well-nourished. No distress.  Cardiovascular: Normal rate, regular rhythm, normal heart sounds and intact distal pulses.   Pulmonary/Chest: Effort normal and breath sounds normal.  Abdominal: Soft. Bowel sounds are normal.  Musculoskeletal:  hemiplegia  Neurological: She is alert and oriented to person, place, and time.  Skin: Skin is warm and dry.  Psychiatric: She has a normal mood and affect.     Labs reviewed: Basic Metabolic Panel:  Recent Labs  08/11/13 10/01/13 04/01/14  NA 142 140 141  K 4.4 3.9 4.0  BUN 13 13 15   CREATININE 0.6 0.5 0.5    Liver Function Tests:  Recent Labs  10/01/13  AST 21  ALT 13  ALKPHOS 30    CBC:  Recent Labs  10/01/13 12/20/13 04/01/14  WBC 6.0 8.6 6.1  HGB 11.8* 12.2 11.6*  HCT 34* 38 35*  PLT 172 189 171     Assessment/Plan 1. Essential hypertension -bp at goal with current therapy, no changs  2. Type 2 diabetes mellitus with other circulatory complications -diet controlled, no changes  3. Dysphagia due to recent stroke -cont modified diet (pureed)   4. Cerebral infarction due to embolism of precerebral artery -cause of stroke, is on plavix  5. Other specified hypothyroidism -cont current synthroid dose Lab Results  Component Value Date   TSH 0.47 03/09/2014   6. Insomnia -melatonin is helping her to rest  Family/ staff Communication: discussed with nursing  Goals of care: long term snf care; DNR, has living will/hcpoa on file in media  Labs/tests ordered:  No new

## 2014-06-14 ENCOUNTER — Encounter: Payer: Self-pay | Admitting: Internal Medicine

## 2014-06-18 DIAGNOSIS — H04123 Dry eye syndrome of bilateral lacrimal glands: Secondary | ICD-10-CM | POA: Diagnosis not present

## 2014-06-24 ENCOUNTER — Encounter: Payer: Self-pay | Admitting: Internal Medicine

## 2014-07-05 DIAGNOSIS — F339 Major depressive disorder, recurrent, unspecified: Secondary | ICD-10-CM | POA: Diagnosis not present

## 2014-07-05 DIAGNOSIS — I69951 Hemiplegia and hemiparesis following unspecified cerebrovascular disease affecting right dominant side: Secondary | ICD-10-CM | POA: Diagnosis not present

## 2014-07-05 DIAGNOSIS — M6281 Muscle weakness (generalized): Secondary | ICD-10-CM | POA: Diagnosis not present

## 2014-07-05 DIAGNOSIS — R2689 Other abnormalities of gait and mobility: Secondary | ICD-10-CM | POA: Diagnosis not present

## 2014-07-07 DIAGNOSIS — R2689 Other abnormalities of gait and mobility: Secondary | ICD-10-CM | POA: Diagnosis not present

## 2014-07-07 DIAGNOSIS — M6281 Muscle weakness (generalized): Secondary | ICD-10-CM | POA: Diagnosis not present

## 2014-07-07 DIAGNOSIS — I69951 Hemiplegia and hemiparesis following unspecified cerebrovascular disease affecting right dominant side: Secondary | ICD-10-CM | POA: Diagnosis not present

## 2014-07-07 DIAGNOSIS — F339 Major depressive disorder, recurrent, unspecified: Secondary | ICD-10-CM | POA: Diagnosis not present

## 2014-07-09 DIAGNOSIS — R2689 Other abnormalities of gait and mobility: Secondary | ICD-10-CM | POA: Diagnosis not present

## 2014-07-09 DIAGNOSIS — I69951 Hemiplegia and hemiparesis following unspecified cerebrovascular disease affecting right dominant side: Secondary | ICD-10-CM | POA: Diagnosis not present

## 2014-07-09 DIAGNOSIS — F339 Major depressive disorder, recurrent, unspecified: Secondary | ICD-10-CM | POA: Diagnosis not present

## 2014-07-09 DIAGNOSIS — M6281 Muscle weakness (generalized): Secondary | ICD-10-CM | POA: Diagnosis not present

## 2014-07-12 ENCOUNTER — Non-Acute Institutional Stay (SKILLED_NURSING_FACILITY): Payer: Medicare Other | Admitting: Adult Health

## 2014-07-12 DIAGNOSIS — I69951 Hemiplegia and hemiparesis following unspecified cerebrovascular disease affecting right dominant side: Secondary | ICD-10-CM | POA: Diagnosis not present

## 2014-07-12 DIAGNOSIS — R2689 Other abnormalities of gait and mobility: Secondary | ICD-10-CM | POA: Diagnosis not present

## 2014-07-12 DIAGNOSIS — I10 Essential (primary) hypertension: Secondary | ICD-10-CM | POA: Diagnosis not present

## 2014-07-12 DIAGNOSIS — E038 Other specified hypothyroidism: Secondary | ICD-10-CM

## 2014-07-12 DIAGNOSIS — I69921 Dysphasia following unspecified cerebrovascular disease: Secondary | ICD-10-CM | POA: Diagnosis not present

## 2014-07-12 DIAGNOSIS — I6310 Cerebral infarction due to embolism of unspecified precerebral artery: Secondary | ICD-10-CM

## 2014-07-12 DIAGNOSIS — I69391 Dysphagia following cerebral infarction: Secondary | ICD-10-CM

## 2014-07-12 DIAGNOSIS — F339 Major depressive disorder, recurrent, unspecified: Secondary | ICD-10-CM | POA: Diagnosis not present

## 2014-07-12 DIAGNOSIS — M6281 Muscle weakness (generalized): Secondary | ICD-10-CM | POA: Diagnosis not present

## 2014-07-14 ENCOUNTER — Encounter: Payer: Self-pay | Admitting: Adult Health

## 2014-07-14 NOTE — Progress Notes (Signed)
Patient ID: KENNITA PAVLOVICH, female   DOB: 1919-10-06, 81 y.o.   MRN: [id number]   Code Status:  DNR Advanced Directive information     Chief Complaint  Patient presents with  . Medical Management of Chronic Issues    HPI:  81 yo female long term care resident seen for medical mgt of her chronic diseases.  She has a h/o hemorrhoids, right hammer toe, DMII with circulatory complications, complete AV block, right hemiplegia, amnesia, primary OA, esophageal dyskinesia, acquired folicularis keratosis, dysphagia, htn, hyperlipidemia, major depressive disorder, and breast cancer.   She has no complaints today. She was treated for hemorrhoids with Ansuol and witch hazel at her last visit and this has resolved.  She continues on a puree diet due to esophageal dysmotility and recurrent esophageal impaction. She is tolerating the diet well but does not care for it. Her VS have been stable. She was sent to the eye doc due to right eye pain last month and ordered refresh eye gtts. She denies pain or change in vision Her weight is down by 5 lbs since in the past month. She denies decreased intake or appetite.  Review of Systems:  Review of Systems  Constitutional: Negative for fever and chills.  HENT: Negative for congestion.   Eyes: Negative for blurred vision, double vision, photophobia and pain.  Respiratory: Negative for cough and shortness of breath.   Cardiovascular: Positive for leg swelling. Negative for chest pain and palpitations.  Gastrointestinal: Negative for heartburn, nausea, vomiting, abdominal pain, diarrhea and constipation.  Genitourinary: Negative for dysuria.  Musculoskeletal: Negative for joint pain.  Neurological: Negative for dizziness, tingling, tremors and headaches.  Psychiatric/Behavioral: Positive for memory loss. Negative for depression.    Medications: Patient's Medications  New Prescriptions   No medications on file  Previous Medications   ACETAMINOPHEN  (TYLENOL) 650 MG CR TABLET    Take 650 mg by mouth 2 (two) times daily. For pain   CAMPHOR-EUCALYPTUS-MENTHOL (VICKS VAPORUB) 4.7-1.2-2.6 % OINT    Apply 1 application topically at bedtime.   CLOPIDOGREL (PLAVIX) 75 MG TABLET    Take 75 mg by mouth daily. For stroke risk reduction   DICLOFENAC SODIUM (VOLTAREN) 1 % GEL    Apply 2 g topically 3 (three) times daily. For pain( shoulder)   ESCITALOPRAM (LEXAPRO) 20 MG TABLET    Take 20 mg by mouth daily. For depression/ anxiety.   GLUCERNA (GLUCERNA) LIQD    Take 237 mLs by mouth. At breakfast and lunch   LEVOTHYROXINE (SYNTHROID, LEVOTHROID) 75 MCG TABLET    Take 75 mcg by mouth daily before breakfast. For thyroid therapy   LIVER OIL-ZINC OXIDE (DESITIN) 40 % OINTMENT    Apply 1 application topically as needed. Apply to rash on buttox/inner thighs after each BM for rash or irritation.   LOSARTAN (COZAAR) 50 MG TABLET    Take 50 mg by mouth daily. For HTN   MELATONIN 5 MG TABS    Take 5 tablets by mouth at bedtime.   MENTHOL-ZINC OXIDE (CALMOSEPTINE) 0.44-20.625 % OINT    Apply 1 application topically as needed. Apply to peri-anal skin and inner buttox after each incontinent episode and PRN for irritation. Marland Kitchen   METOPROLOL (LOPRESSOR) 50 MG TABLET    Take 50 mg by mouth 2 (two) times daily.   MULTIPLE VITAMIN (MULTIVITAMIN) TABLET    Take 1 tablet by mouth daily.   MULTIPLE VITAMINS-MINERALS (PRESERVISION AREDS PO)    Take by mouth daily.   NON  FORMULARY    Biotene Spray one spray before meals and at bedtime   OMEPRAZOLE (PRILOSEC) 20 MG CAPSULE    Take 20 mg by mouth daily. For GERD   OVER THE COUNTER MEDICATION    Take 1 spray by mouth 2 (two) times daily. Biotin spray   POLYETHYL GLYCOL-PROPYL GLYCOL (SYSTANE) 0.4-0.3 % SOLN    Apply 1 drop to eye 2 (two) times daily. One drop both eye every 2 hours as needed for dryness or irritation   SODIUM CHLORIDE (OCEAN) 0.65 % NASAL SPRAY    Place 1 spray into the nose 2 (two) times daily.   Modified  Medications   No medications on file  Discontinued Medications   No medications on file    Physical Exam: Filed Vitals:   07/14/14 1407  BP: 102/64  Pulse: 62  Temp: 97.5 F (36.4 C)  Resp: 16  Weight: 158 lb 11.2 oz (71.986 kg)  SpO2: 97%  Physical Exam  Constitutional: She is oriented to person, place, and time. She appears well-developed and well-nourished. No distress.  Cardiovascular: Normal rate, regular rhythm, normal heart sounds and intact distal pulses.   Pulmonary/Chest: Effort normal and breath sounds normal.  Abdominal: Soft. Bowel sounds are normal. She exhibits no distension.  Musculoskeletal:  hemiplegia  Neurological: She is alert and oriented to person, place, and time.  Skin: Skin is warm and dry. She is not diaphoretic.  Psychiatric: She has a normal mood and affect.   Wt Readings from Last 3 Encounters:  07/14/14 158 lb 11.2 oz (71.986 kg)  06/09/14 163 lb 12.8 oz (74.299 kg)  05/10/14 162 lb 12.8 oz (73.846 kg)     Labs reviewed: Basic Metabolic Panel:  Recent Labs  08/11/13 10/01/13 04/01/14  NA 142 140 141  K 4.4 3.9 4.0  BUN 13 13 15   CREATININE 0.6 0.5 0.5    Liver Function Tests:  Recent Labs  10/01/13  AST 21  ALT 13  ALKPHOS 30    CBC:  Recent Labs  10/01/13 12/20/13 04/01/14  WBC 6.0 8.6 6.1  HGB 11.8* 12.2 11.6*  HCT 34* 38 35*  PLT 172 189 171    Lab Results  Component Value Date   TSH 0.47 03/09/2014    Assessment/Plan  1. Dysphagia due to recent stroke Stable. Continue puree diet.   2. Other specified hypothyroidism Stable. They will reweigh her to verify she has lost weight. If so she may need another TSH.    3. Cerebral infarction due to embolism of precerebral artery Stable, on plavix. No reports of bleeding. Has hemiplegia and uses a scooter to move about the facility.   4. Essential hypertension Stable on Cozaar and Lopressor. Continue current meds.    Cindi Carbon, ANP [facility] [phone]

## 2014-07-16 DIAGNOSIS — H04123 Dry eye syndrome of bilateral lacrimal glands: Secondary | ICD-10-CM | POA: Diagnosis not present

## 2014-07-21 DIAGNOSIS — M6281 Muscle weakness (generalized): Secondary | ICD-10-CM | POA: Diagnosis not present

## 2014-07-21 DIAGNOSIS — F339 Major depressive disorder, recurrent, unspecified: Secondary | ICD-10-CM | POA: Diagnosis not present

## 2014-07-21 DIAGNOSIS — R2689 Other abnormalities of gait and mobility: Secondary | ICD-10-CM | POA: Diagnosis not present

## 2014-07-21 DIAGNOSIS — I69951 Hemiplegia and hemiparesis following unspecified cerebrovascular disease affecting right dominant side: Secondary | ICD-10-CM | POA: Diagnosis not present

## 2014-08-03 ENCOUNTER — Ambulatory Visit (INDEPENDENT_AMBULATORY_CARE_PROVIDER_SITE_OTHER): Payer: Medicare Other | Admitting: *Deleted

## 2014-08-03 ENCOUNTER — Telehealth: Payer: Self-pay | Admitting: Cardiology

## 2014-08-03 DIAGNOSIS — I442 Atrioventricular block, complete: Secondary | ICD-10-CM | POA: Diagnosis not present

## 2014-08-03 NOTE — Telephone Encounter (Signed)
Attempted to confirm remote transmission with pt. No answer and was unable to leave a message.   

## 2014-08-04 NOTE — Progress Notes (Signed)
Remote pacemaker transmission.   

## 2014-08-06 ENCOUNTER — Telehealth: Payer: Self-pay | Admitting: *Deleted

## 2014-08-06 NOTE — Telephone Encounter (Signed)
Patient son in law called and stated that the insurance wont cover one of her medications and needs prior authorization. Patient is at skilled at [facility]. Instructed him to call her nurse there in skilled and she would handle this for him. He agreed.

## 2014-08-07 LAB — CUP PACEART REMOTE DEVICE CHECK
Battery Voltage: 2.79 V
Brady Statistic AP VP Percent: 1 %
Brady Statistic AP VS Percent: 0 %
Brady Statistic AS VP Percent: 99 %
Brady Statistic AS VS Percent: 0 %
Lead Channel Impedance Value: 415 Ohm
Lead Channel Pacing Threshold Amplitude: 1 V
Lead Channel Pacing Threshold Pulse Width: 0.4 ms
Lead Channel Setting Pacing Amplitude: 2 V
Lead Channel Setting Pacing Pulse Width: 0.4 ms
Lead Channel Setting Sensing Sensitivity: 2.8 mV
MDC IDC MSMT BATTERY IMPEDANCE: 296 Ohm
MDC IDC MSMT BATTERY REMAINING LONGEVITY: 96 mo
MDC IDC MSMT LEADCHNL RA IMPEDANCE VALUE: 480 Ohm
MDC IDC MSMT LEADCHNL RA PACING THRESHOLD AMPLITUDE: 0.625 V
MDC IDC MSMT LEADCHNL RA SENSING INTR AMPL: 1.4 mV
MDC IDC MSMT LEADCHNL RV PACING THRESHOLD PULSEWIDTH: 0.4 ms
MDC IDC SESS DTM: 20160531173542
MDC IDC SET LEADCHNL RV PACING AMPLITUDE: 2.5 V

## 2014-08-10 ENCOUNTER — Non-Acute Institutional Stay (SKILLED_NURSING_FACILITY): Payer: Medicare Other | Admitting: Internal Medicine

## 2014-08-10 ENCOUNTER — Encounter: Payer: Self-pay | Admitting: Internal Medicine

## 2014-08-10 DIAGNOSIS — K649 Unspecified hemorrhoids: Secondary | ICD-10-CM | POA: Diagnosis not present

## 2014-08-10 DIAGNOSIS — K224 Dyskinesia of esophagus: Secondary | ICD-10-CM

## 2014-08-10 DIAGNOSIS — E1159 Type 2 diabetes mellitus with other circulatory complications: Secondary | ICD-10-CM | POA: Diagnosis not present

## 2014-08-10 DIAGNOSIS — K625 Hemorrhage of anus and rectum: Secondary | ICD-10-CM | POA: Diagnosis not present

## 2014-08-10 DIAGNOSIS — I69391 Dysphagia following cerebral infarction: Secondary | ICD-10-CM

## 2014-08-10 DIAGNOSIS — I10 Essential (primary) hypertension: Secondary | ICD-10-CM

## 2014-08-10 DIAGNOSIS — I69921 Dysphasia following unspecified cerebrovascular disease: Secondary | ICD-10-CM

## 2014-08-10 DIAGNOSIS — Z800 Family history of malignant neoplasm of digestive organs: Secondary | ICD-10-CM | POA: Diagnosis not present

## 2014-08-10 DIAGNOSIS — R195 Other fecal abnormalities: Secondary | ICD-10-CM | POA: Diagnosis not present

## 2014-08-10 LAB — CBC AND DIFFERENTIAL
HCT: 35 % — AB (ref 36–46)
HEMOGLOBIN: 11.6 g/dL — AB (ref 12.0–16.0)
Platelets: 184 10*3/uL (ref 150–399)
WBC: 10 10*3/mL

## 2014-08-10 LAB — BASIC METABOLIC PANEL
BUN: 16 mg/dL (ref 4–21)
Creatinine: 0.7 mg/dL (ref 0.5–1.1)
Glucose: 103 mg/dL
Potassium: 4.3 mmol/L (ref 3.4–5.3)
SODIUM: 136 mmol/L — AB (ref 137–147)

## 2014-08-10 NOTE — Progress Notes (Signed)
Patient ID: Rachel Cooke, female   DOB: June 20, 1919, 81 y.o.   MRN: [id number]  Location:  [facility] Provider:  Jonelle Sidle L. Mariea Cooke, D.O., C.M.D.  Code Status:  DNR Goals of care: Advanced Directive information Does patient have an advance directive?: Yes, Type of Advance Directive: Out of facility DNR (pink MOST or yellow form);Living will;Healthcare Power of Attorney, Pre-existing out of facility DNR order (yellow form or pink MOST form): Yellow form placed in chart (order not valid for inpatient use) (living will/hcpoa scanned in media), Does patient want to make changes to advanced directive?: No - Patient declined  Chief Complaint  Patient presents with  . Acute Visit    large amount of blood in the toilet  . Medical Management of Chronic Issues    HPI:  81 yo female with h/o severe hemorrhoids, prior mild rectal bleeding, esophageal dysmotility with prior food impactions, DMII, hypothyroid, htn, breast cancer s/p right radical mastectomy, colon polyps was seen for an acute visit and med mgt of chronic diseases.  She had a bowel movement just after lunch today with a significant amount of dark red blood in the toilet.  The bathroom smelled like blood when I entered to see the bowel movement.  She denies abdominal pain, was mildly hypertensive, but other vitals within normal limits.  She notes she does have hemorrhoids that have been bothersome for a long time now.  She asks that I call her daughter.  Her daughter, Rachel Cooke, indicates that this is the 4th episode of rectal bleeding her mother has had in the past 6 mos or so and feels it's time she go back to see her primary GI physician, Dr. Watt Cooke. She also reminds me that there is a family history of colon cancer so she might need further investigation.  Previously, it had been decided that she did not need further cscopes due to her age of 69 at that time and debility.   Review of Systems:  Review of Systems  Constitutional: Negative for  fever, chills and malaise/fatigue.  Respiratory: Negative for shortness of breath.   Cardiovascular: Negative for chest pain.  Gastrointestinal: Positive for diarrhea, constipation and blood in stool. Negative for heartburn, nausea, vomiting, abdominal pain and melena.  Genitourinary: Negative for dysuria.  Musculoskeletal: Negative for falls.  Neurological: Positive for weakness. Negative for dizziness and headaches.  Psychiatric/Behavioral: Positive for memory loss.    Past Medical History  Diagnosis Date  . Hypertension   . Dendritic keratitis 2012  . Malignant neoplasm of breast (female), unspecified site 1970    S/P Rt radical mastectomy  . Unspecified hypothyroidism 2009  . Type II or unspecified type diabetes mellitus without mention of complication, not stated as uncontrolled 2003  . Unspecified vitamin D deficiency 2009  . Pure hyperglyceridemia 2004  . Anxiety state, unspecified   . Macular degeneration (senile) of retina, unspecified 2012  . Unspecified glaucoma 2013  . Tinnitus 2007  . Coronary atherosclerosis of native coronary artery 2003    non obstructive by Cath 2003  . Atrioventricular block, complete 2003    s/p PPM 2003, generator chnage 2011  . Allergic rhinitis due to pollen   . Acquired cyst of kidney 2002  . Osteoarthrosis, unspecified whether generalized or localized, unspecified site 2013  . Unspecified arthropathy, pelvic region and thigh 2010  . Senile osteoporosis 2003  . Abnormality of gait 2007  . Edema 2003  . Personal history of colonic polyps      s/p  colonoscopy/polypectomy 2003  . Cardiac pacemaker in situ 2003  . CVA (cerebral infarction) 06/28/2012    rt hemiparesis, dysphagia  . NSTEMI (non-ST elevated myocardial infarction) 06/28/2012  . Urinary retention 07/07/2012  . Pacemaker   . Stroke 06/28/2012  . Food impaction of esophagus 08/15/2012  . Esophageal dysmotility 08/19/2012  . Acute sinus infection 11/25/2012  . Altered mental status  11/28/2012  . Dysuria     Patient Active Problem List   Diagnosis Date Noted  . Hemorrhoids 05/10/2014  . Insomnia 04/09/2014  . Cough 01/25/2014  . Encounter for long-term (current) use of other medications 11/16/2013  . Hemiparesis affecting right side as late effect of cerebrovascular accident 12/11/2012  . Osteoarthritis   . Allergic pharyngitis 10/31/2012  . Edema 10/16/2012  . Esophageal dysmotility 08/19/2012  . Food impaction of esophagus 08/15/2012  . Dysphagia due to recent stroke 07/07/2012  . Diabetes   . Pure hyperglyceridemia   . Anxiety state   . Atrioventricular block, complete   . NSTEMI (non-ST elevated myocardial infarction) 06/28/2012  . HTN (hypertension) 06/28/2012  . Dyslipidemia 06/28/2012  . Hypothyroidism 06/28/2012  . CVA (cerebral infarction) 06/28/2012    Allergies  Allergen Reactions  . Lactose Intolerance (Gi)     Medications: Patient's Medications  New Prescriptions   No medications on file  Previous Medications   ACETAMINOPHEN (TYLENOL) 650 MG CR TABLET    Take 650 mg by mouth 2 (two) times daily. For pain   CAMPHOR-EUCALYPTUS-MENTHOL (VICKS VAPORUB) 4.7-1.2-2.6 % OINT    Apply 1 application topically at bedtime.   CLOPIDOGREL (PLAVIX) 75 MG TABLET    Take 75 mg by mouth daily. For stroke risk reduction   DICLOFENAC SODIUM (VOLTAREN) 1 % GEL    Apply 2 g topically 3 (three) times daily. For pain( shoulder)   ESCITALOPRAM (LEXAPRO) 20 MG TABLET    Take 20 mg by mouth daily. For depression/ anxiety.   GLUCERNA (GLUCERNA) LIQD    Take 237 mLs by mouth. At breakfast and lunch   LEVOTHYROXINE (SYNTHROID, LEVOTHROID) 75 MCG TABLET    Take 75 mcg by mouth daily before breakfast. For thyroid therapy   LIVER OIL-ZINC OXIDE (DESITIN) 40 % OINTMENT    Apply 1 application topically as needed. Apply to rash on buttox/inner thighs after each BM for rash or irritation.   LOSARTAN (COZAAR) 50 MG TABLET    Take 50 mg by mouth daily. For HTN   MELATONIN 5  MG TABS    Take 5 tablets by mouth at bedtime.   MENTHOL-ZINC OXIDE (CALMOSEPTINE) 0.44-20.625 % OINT    Apply 1 application topically as needed. Apply to peri-anal skin and inner buttox after each incontinent episode and PRN for irritation. Marland Kitchen   METOPROLOL (LOPRESSOR) 50 MG TABLET    Take 50 mg by mouth 2 (two) times daily.   MULTIPLE VITAMIN (MULTIVITAMIN) TABLET    Take 1 tablet by mouth daily.   MULTIPLE VITAMINS-MINERALS (PRESERVISION AREDS PO)    Take by mouth daily.   NON FORMULARY    Biotene Spray one spray before meals and at bedtime   OMEPRAZOLE (PRILOSEC) 20 MG CAPSULE    Take 20 mg by mouth daily. For GERD   OVER THE COUNTER MEDICATION    Take 1 spray by mouth 2 (two) times daily. Biotin spray   POLYETHYL GLYCOL-PROPYL GLYCOL (SYSTANE) 0.4-0.3 % SOLN    Apply 1 drop to eye 2 (two) times daily. One drop both eye every 2 hours as needed  for dryness or irritation   SODIUM CHLORIDE (OCEAN) 0.65 % NASAL SPRAY    Place 1 spray into the nose 2 (two) times daily.   Modified Medications   No medications on file  Discontinued Medications   No medications on file    Physical Exam: Filed Vitals:   08/10/14 1453  BP: 162/73  Pulse: 91  Temp: 97.5 F (36.4 C)  Resp: 18  Height: 5\' 1"  (1.549 m)  Weight: 164 lb (74.39 kg)  SpO2: 96%   Body mass index is 31 kg/(m^2).  Physical Exam  Constitutional: She appears well-developed and well-nourished. No distress.  Seated in her recliner with her feet up  Cardiovascular: Normal rate, regular rhythm, normal heart sounds and intact distal pulses.   Pulmonary/Chest: Effort normal and breath sounds normal.  Abdominal: Soft. She exhibits no distension and no mass. There is no tenderness. There is no rebound and no guarding.  Hyperactive bowel sounds  Neurological: She is alert.  Pleasant with short term memory loss  Skin: Skin is warm and dry.  Psychiatric: She has a normal mood and affect.    Labs reviewed: Basic Metabolic  Panel:  Recent Labs  08/11/13 10/01/13 04/01/14  NA 142 140 141  K 4.4 3.9 4.0  BUN 13 13 15   CREATININE 0.6 0.5 0.5    Liver Function Tests:  Recent Labs  10/01/13  AST 21  ALT 13  ALKPHOS 30    CBC:  Recent Labs  10/01/13 12/20/13 04/01/14  WBC 6.0 8.6 6.1  HGB 11.8* 12.2 11.6*  HCT 34* 38 35*  PLT 172 189 171    Lab Results  Component Value Date   TSH 0.47 03/09/2014   Lab Results  Component Value Date   HGBA1C 6.0 08/11/2013   Lab Results  Component Value Date   CHOL 155 08/11/2013   HDL 38 08/11/2013   LDLCALC 96 08/11/2013   TRIG 196* 08/11/2013   CHOLHDL 4.1 07/01/2012     Patient Care Team: [name], DO as PCP - General (Geriatric Medicine) [facility] Clarene Essex, MD as Attending Physician (Gastroenterology) Garvin Fila, MD as Consulting Physician (Neurology)  Assessment/Plan 1. Rectal bleeding -large amt of blood seen in the toilet this time but no related symptoms Cbc, bmp stat VS q shift while awake for 48 hrs Monitor BMs and notify us if significant blood recurs or she develops abdominal pain Witch hazel to hemorrhoids for 2 wks F/u appt with Dr. Watt Cooke  I do not recommend cscope at this point, but GI opinion may help to answer some questions her family has  2. Bleeding hemorrhoids -has no hemorrhoids with 2-3 prior episodes this year of bleeding from them  3. Family history of colon cancer -due to this, her daughter requested she f/u with Dr. Watt Cooke  4. Dysphagia due to recent stroke -stable, has specialized diet due to prior impactions from #5  5. Esophageal dysmotility -cont specialized diet per family and monitor -pt would prefer to be left alone   6. Type 2 diabetes mellitus with other circulatory complications -stable, no changes  7. Essential hypertension -bp at goal   Family/ staff Communication: discussed with her daughter, Verlon Au and LPN on SNF at the time  Labs/tests  ordered:  Cbc, bmp, GI follow up at family request  [place]. Joriel Streety, D.O. [facility] [address], [place] [address] Cell Phone (Mon-Fri 8am-5pm):  [phone] On Call:  [phone] & follow prompts after 5pm & weekends Office Phone:  [phone] Office Fax:  [phone]

## 2014-08-11 DIAGNOSIS — R633 Feeding difficulties: Secondary | ICD-10-CM | POA: Diagnosis not present

## 2014-08-11 DIAGNOSIS — R1310 Dysphagia, unspecified: Secondary | ICD-10-CM | POA: Diagnosis not present

## 2014-08-11 DIAGNOSIS — K921 Melena: Secondary | ICD-10-CM | POA: Diagnosis not present

## 2014-08-17 DIAGNOSIS — E1159 Type 2 diabetes mellitus with other circulatory complications: Secondary | ICD-10-CM | POA: Diagnosis not present

## 2014-08-17 DIAGNOSIS — L602 Onychogryphosis: Secondary | ICD-10-CM | POA: Diagnosis not present

## 2014-08-17 DIAGNOSIS — L84 Corns and callosities: Secondary | ICD-10-CM | POA: Diagnosis not present

## 2014-08-25 ENCOUNTER — Encounter: Payer: Self-pay | Admitting: Cardiology

## 2014-08-27 ENCOUNTER — Ambulatory Visit: Payer: Medicare Other

## 2014-08-27 ENCOUNTER — Other Ambulatory Visit (HOSPITAL_COMMUNITY): Payer: Self-pay | Admitting: Gastroenterology

## 2014-08-27 DIAGNOSIS — R1314 Dysphagia, pharyngoesophageal phase: Secondary | ICD-10-CM

## 2014-09-02 ENCOUNTER — Ambulatory Visit (HOSPITAL_COMMUNITY)
Admission: RE | Admit: 2014-09-02 | Discharge: 2014-09-02 | Disposition: A | Discharge status: Home / Self Care | Payer: Medicare Other | Source: Ambulatory Visit | Attending: Gastroenterology | Admitting: Gastroenterology

## 2014-09-02 DIAGNOSIS — R1310 Dysphagia, unspecified: Secondary | ICD-10-CM | POA: Diagnosis not present

## 2014-09-02 DIAGNOSIS — R633 Feeding difficulties: Secondary | ICD-10-CM | POA: Insufficient documentation

## 2014-09-02 DIAGNOSIS — R1313 Dysphagia, pharyngeal phase: Secondary | ICD-10-CM | POA: Insufficient documentation

## 2014-09-02 DIAGNOSIS — R1312 Dysphagia, oropharyngeal phase: Secondary | ICD-10-CM | POA: Diagnosis present

## 2014-09-02 DIAGNOSIS — K228 Other specified diseases of esophagus: Secondary | ICD-10-CM | POA: Diagnosis not present

## 2014-09-02 DIAGNOSIS — R1314 Dysphagia, pharyngoesophageal phase: Secondary | ICD-10-CM

## 2014-09-02 NOTE — Progress Notes (Signed)
   09/02/14 1400  SLP G-Codes **NOT FOR INPATIENT CLASS**  Functional Assessment Tool Used clinical judgement  Functional Limitations Swallowing  Swallow Current Status (D1497) CJ  Swallow Goal Status (W2637) CJ  Swallow Discharge Status (C5885) CJ  SLP Evaluations  $ SLP Speech Visit 1 Procedure  SLP Evaluations  $Swallowing Treatment 1 Procedure  $MBS Swallow Outpatient 1 Procedure

## 2014-09-07 ENCOUNTER — Encounter: Payer: Self-pay | Admitting: Internal Medicine

## 2014-09-23 ENCOUNTER — Encounter: Payer: Self-pay | Admitting: Adult Health

## 2014-09-23 ENCOUNTER — Non-Acute Institutional Stay (SKILLED_NURSING_FACILITY): Payer: Medicare Other | Admitting: Adult Health

## 2014-09-23 DIAGNOSIS — E1159 Type 2 diabetes mellitus with other circulatory complications: Secondary | ICD-10-CM

## 2014-09-23 DIAGNOSIS — R059 Cough, unspecified: Secondary | ICD-10-CM

## 2014-09-23 DIAGNOSIS — K640 First degree hemorrhoids: Secondary | ICD-10-CM

## 2014-09-23 DIAGNOSIS — I10 Essential (primary) hypertension: Secondary | ICD-10-CM

## 2014-09-23 DIAGNOSIS — R05 Cough: Secondary | ICD-10-CM

## 2014-09-23 DIAGNOSIS — J811 Chronic pulmonary edema: Secondary | ICD-10-CM | POA: Diagnosis not present

## 2014-09-23 DIAGNOSIS — K224 Dyskinesia of esophagus: Secondary | ICD-10-CM

## 2014-09-23 DIAGNOSIS — I69351 Hemiplegia and hemiparesis following cerebral infarction affecting right dominant side: Secondary | ICD-10-CM

## 2014-09-23 DIAGNOSIS — I69851 Hemiplegia and hemiparesis following other cerebrovascular disease affecting right dominant side: Secondary | ICD-10-CM

## 2014-09-23 NOTE — Progress Notes (Signed)
Patient ID: Rachel Cooke, female   DOB: 26-Jul-1919, 81 y.o.   MRN: [id number]   Code Status:  DNR Advanced Directive information    Patient Care Team: [name], DO as PCP - General (Geriatric Medicine) [facility] Clarene Essex, MD as Attending Physician (Gastroenterology) Garvin Fila, MD as Consulting Physician (Neurology)  Chief Complaint  Patient presents with  . Acute Visit    sore throat  . Medical Management of Chronic Issues    HPI:  81 yo female long term care resident seen for medical mgt of her chronic diseases.  She has a h/o hemorrhoids, right hammer toe, DMII with circulatory complications, complete AV block s/p PPM, right hemiplegia, amnesia, primary OA, esophageal dyskinesia,  dysphagia, htn, hyperlipidemia, major depressive disorder, and breast cancer.    I was asked to see her for sore throat present for 3 days with cough, congestion, and white/yellow sputum. She denies any fever, decreased appetite, n, v, d, or SOB.   She continues on a puree diet due to esophageal dysmotility and recurrent esophageal impaction. She had a recent swallow eval ordered by Dr. Watt Climes. There were indications per the MBS that her diet could be advanced but her family has declined to do so due to her above hx. She was also evaluated by Dr. Watt Climes due to three episodes of blood in her stool (clots) with small hemorrhoids noted. The hemorrhoids have responded to witch hazel and her family declined a colonoscopy at this time. She remains on plavix with a stable hemoglobin. The staff denies any blood in her stool now.    Functional status: She is non ambulatory due to her CVA and is using a stand up lift to the scooter or chair.   Review of Systems:  Review of Systems  Constitutional: Negative for fever and chills.  HENT: Positive for congestion, ear pain and sore throat.   Eyes: Negative for blurred vision, double vision, photophobia and pain.  Respiratory:  Positive for cough and sputum production (yellow). Negative for shortness of breath.   Cardiovascular: Positive for leg swelling. Negative for chest pain and palpitations.  Gastrointestinal: Negative for heartburn, nausea, vomiting, abdominal pain, diarrhea and constipation.       Has hemorrhoids that have improved  Genitourinary: Negative for dysuria.  Musculoskeletal: Negative for joint pain.  Skin: Negative for rash.  Neurological: Negative for dizziness, tingling, tremors and headaches.  Psychiatric/Behavioral: Positive for memory loss. Negative for depression.    Medications: Patient's Medications  New Prescriptions   No medications on file  Previous Medications   ACETAMINOPHEN (TYLENOL) 650 MG CR TABLET    Take 650 mg by mouth 2 (two) times daily. For pain   CAMPHOR-EUCALYPTUS-MENTHOL (VICKS VAPORUB) 4.7-1.2-2.6 % OINT    Apply 1 application topically at bedtime as needed.    CLOPIDOGREL (PLAVIX) 75 MG TABLET    Take 75 mg by mouth daily. For stroke risk reduction   ESCITALOPRAM (LEXAPRO) 20 MG TABLET    Take 20 mg by mouth daily. For depression/ anxiety.   GLUCERNA (GLUCERNA) LIQD    Take 237 mLs by mouth. At breakfast and lunch   LEVOTHYROXINE (SYNTHROID, LEVOTHROID) 75 MCG TABLET    Take 75 mcg by mouth daily before breakfast. For thyroid therapy   LIVER OIL-ZINC OXIDE (DESITIN) 40 % OINTMENT    Apply 1 application topically as needed. Apply to rash on buttox/inner thighs after each BM for rash or irritation.   LORATADINE (CLARITIN) 10 MG TABLET  Take 10 mg by mouth daily as needed for allergies.   LOSARTAN (COZAAR) 50 MG TABLET    Take 50 mg by mouth daily. For HTN   MELATONIN 5 MG TABS    Take 5 tablets by mouth at bedtime.   MENTHOL-ZINC OXIDE (CALMOSEPTINE) 0.44-20.625 % OINT    Apply 1 application topically as needed. Apply to peri-anal skin and inner buttox after each incontinent episode and PRN for irritation. Marland Kitchen   METOPROLOL TARTRATE (LOPRESSOR) 25 MG TABLET    Take 25  mg by mouth 2 (two) times daily.   MULTIPLE VITAMIN (MULTIVITAMIN) TABLET    Take 1 tablet by mouth daily.   MULTIPLE VITAMINS-MINERALS (PRESERVISION AREDS PO)    Take by mouth daily.   NON FORMULARY    Biotene Spray one spray before meals and at bedtime   OMEPRAZOLE (PRILOSEC) 20 MG CAPSULE    Take 20 mg by mouth daily. For GERD   OVER THE COUNTER MEDICATION    Take 1 spray by mouth 2 (two) times daily. Biotin spray   POLYETHYL GLYCOL-PROPYL GLYCOL (SYSTANE) 0.4-0.3 % SOLN    Apply 1 drop to eye 2 (two) times daily. One drop both eye every 2 hours as needed for dryness or irritation   SODIUM CHLORIDE (OCEAN) 0.65 % NASAL SPRAY    Place 1 spray into the nose 2 (two) times daily.    TROLAMINE SALICYLATE (ASPERCREME) 10 % CREAM    Apply 1 application topically 2 (two) times daily as needed for muscle pain.   WITCH HAZEL LIQD    Apply 1 application topically at bedtime as needed.  Modified Medications   No medications on file  Discontinued Medications   DICLOFENAC SODIUM (VOLTAREN) 1 % GEL    Apply 2 g topically 3 (three) times daily. For pain( shoulder)   METOPROLOL (LOPRESSOR) 50 MG TABLET    Take 50 mg by mouth 2 (two) times daily.    Physical Exam: Filed Vitals:   09/23/14 1132  BP: 128/77  Pulse: 91  Temp: 97.7 F (36.5 C)  Resp: 18  Weight: 162 lb 3.2 oz (73.573 kg)  SpO2: 99%  Physical Exam  Constitutional: She appears well-developed and well-nourished. No distress.  HENT:  Head: Normocephalic and atraumatic.  Nose: Rhinorrhea present. No mucosal edema or sinus tenderness. No epistaxis. Right sinus exhibits no maxillary sinus tenderness and no frontal sinus tenderness. Left sinus exhibits no maxillary sinus tenderness and no frontal sinus tenderness.  Mouth/Throat: Posterior oropharyngeal erythema present. No oropharyngeal exudate.  Cardiovascular: Normal rate, regular rhythm, normal heart sounds and intact distal pulses.   Pulmonary/Chest: Effort normal and breath sounds  normal.  Abdominal: Soft. Bowel sounds are normal. She exhibits no distension.  Musculoskeletal:  hemiparesis  Neurological: She is alert.  Oriented but forgetful of the details of her care  Skin: Skin is warm and dry. She is not diaphoretic.  Psychiatric: She has a normal mood and affect.   Wt Readings from Last 3 Encounters:  09/23/14 162 lb 3.2 oz (73.573 kg)  08/10/14 164 lb (74.39 kg)  07/14/14 158 lb 11.2 oz (71.986 kg)     Labs reviewed: Basic Metabolic Panel:  Recent Labs  10/01/13 04/01/14 08/10/14  NA 140 141 136*  K 3.9 4.0 4.3  BUN 13 15 16   CREATININE 0.5 0.5 0.7    Liver Function Tests:  Recent Labs  10/01/13  AST 21  ALT 13  ALKPHOS 30    CBC:  Recent Labs  12/20/13  04/01/14 08/10/14  WBC 8.6 6.1 10.0  HGB 12.2 11.6* 11.6*  HCT 38 35* 35*  PLT 189 171 184    Lab Results  Component Value Date   TSH 0.47 03/09/2014    Assessment/Plan  1. Cough -Robitussin DM 20 cc TID for 72 hrs -PCXR r/o pna -Appears viral at this point with symptoms for only 3 days of sore throat and congestion without fever or SOB  2. Esophageal dysmotility -continue puree diet per family request   3. Type 2 diabetes mellitus with other circulatory complications CBG WNL without meds, continue to monitor  4. First degree hemorrhoids Improved with witch hazel per staff and resident. Monitor for blood in stool.  5. Hemiparesis affecting right side as late effect of cerebrovascular accident -uses scooter to move throughout the facility , no new events.  -continue plavix  6. HTN BP stable, continue to monitor BMP periodically     Cindi Carbon, [place] [phone]

## 2014-09-29 DIAGNOSIS — Z961 Presence of intraocular lens: Secondary | ICD-10-CM | POA: Diagnosis not present

## 2014-09-29 DIAGNOSIS — H3531 Nonexudative age-related macular degeneration: Secondary | ICD-10-CM | POA: Diagnosis not present

## 2014-09-29 DIAGNOSIS — H52203 Unspecified astigmatism, bilateral: Secondary | ICD-10-CM | POA: Diagnosis not present

## 2014-09-29 DIAGNOSIS — H40013 Open angle with borderline findings, low risk, bilateral: Secondary | ICD-10-CM | POA: Diagnosis not present

## 2014-09-29 LAB — HM DIABETES EYE EXAM

## 2014-10-01 ENCOUNTER — Non-Acute Institutional Stay (SKILLED_NURSING_FACILITY): Payer: Medicare Other | Admitting: Adult Health

## 2014-10-01 ENCOUNTER — Encounter: Payer: Self-pay | Admitting: Adult Health

## 2014-10-01 DIAGNOSIS — J069 Acute upper respiratory infection, unspecified: Secondary | ICD-10-CM | POA: Diagnosis not present

## 2014-10-01 DIAGNOSIS — J302 Other seasonal allergic rhinitis: Secondary | ICD-10-CM

## 2014-10-01 NOTE — Progress Notes (Signed)
Patient ID: Rachel Cooke, female   DOB: 1919-10-08, 81 y.o.   MRN: [id number]   Code Status:  DNR Advanced Directive information    Patient Care Team: [name], DO as PCP - General (Geriatric Medicine) [facility] Clarene Essex, MD as Attending Physician (Gastroenterology) Garvin Fila, MD as Consulting Physician (Neurology)  Chief Complaint  Patient presents with  . Acute Visit    cough, congestion    HPI:  81 yo female long term care resident seen for cough.  She has a h/o hemorrhoids, right hammer toe, DMII with circulatory complications, complete AV block s/p PPM, right hemiplegia, amnesia, primary OA, esophageal dyskinesia,  dysphagia, htn, hyperlipidemia, major depressive disorder, and breast cancer.  Seen last week for cough and congestion and by her accounts her symptoms have not improved with robitussin or claritin. She reports a cough with yellow sputum, sore throat, nasal congestion, malaise, and generally note feeling well.  Also reporting itchy, watery eyes for 4 weeks.   Review of Systems:  Review of Systems  Constitutional: Negative for fever and chills.  HENT: Positive for congestion, ear pain and sore throat.   Eyes: Negative for blurred vision, double vision, photophobia and pain.  Respiratory: Positive for cough and sputum production (yellow). Negative for shortness of breath.   Cardiovascular: Positive for leg swelling. Negative for chest pain and palpitations.  Gastrointestinal: Negative for heartburn, nausea, vomiting, abdominal pain, diarrhea and constipation.       Has hemorrhoids that have improved  Genitourinary: Negative for dysuria.  Musculoskeletal: Negative for joint pain.  Skin: Negative for rash.  Neurological: Negative for dizziness, tingling, tremors and headaches.  Psychiatric/Behavioral: Positive for memory loss. Negative for depression.    Medications: Patient's Medications  New Prescriptions   No medications  on file  Previous Medications   ACETAMINOPHEN (TYLENOL) 650 MG CR TABLET    Take 650 mg by mouth 2 (two) times daily. For pain   CAMPHOR-EUCALYPTUS-MENTHOL (VICKS VAPORUB) 4.7-1.2-2.6 % OINT    Apply 1 application topically at bedtime as needed.    CLOPIDOGREL (PLAVIX) 75 MG TABLET    Take 75 mg by mouth daily. For stroke risk reduction   ESCITALOPRAM (LEXAPRO) 20 MG TABLET    Take 20 mg by mouth daily. For depression/ anxiety.   GLUCERNA (GLUCERNA) LIQD    Take 237 mLs by mouth. At breakfast and lunch   LEVOTHYROXINE (SYNTHROID, LEVOTHROID) 75 MCG TABLET    Take 75 mcg by mouth daily before breakfast. For thyroid therapy   LIVER OIL-ZINC OXIDE (DESITIN) 40 % OINTMENT    Apply 1 application topically as needed. Apply to rash on buttox/inner thighs after each BM for rash or irritation.   LORATADINE (CLARITIN) 10 MG TABLET    Take 10 mg by mouth daily.    LOSARTAN (COZAAR) 50 MG TABLET    Take 50 mg by mouth daily. For HTN   MELATONIN 5 MG TABS    Take 5 tablets by mouth at bedtime.   MENTHOL-ZINC OXIDE (CALMOSEPTINE) 0.44-20.625 % OINT    Apply 1 application topically as needed. Apply to peri-anal skin and inner buttox after each incontinent episode and PRN for irritation. Marland Kitchen   METOPROLOL TARTRATE (LOPRESSOR) 25 MG TABLET    Take 25 mg by mouth 2 (two) times daily.   MULTIPLE VITAMIN (MULTIVITAMIN) TABLET    Take 1 tablet by mouth daily.   MULTIPLE VITAMINS-MINERALS (PRESERVISION AREDS PO)    Take by mouth daily.  NON FORMULARY    Biotene Spray one spray before meals and at bedtime   OMEPRAZOLE (PRILOSEC) 20 MG CAPSULE    Take 20 mg by mouth daily. For GERD   OVER THE COUNTER MEDICATION    Take 1 spray by mouth 2 (two) times daily. Biotin spray   POLYETHYL GLYCOL-PROPYL GLYCOL (SYSTANE) 0.4-0.3 % SOLN    Apply 1 drop to eye 2 (two) times daily. One drop both eye every 2 hours as needed for dryness or irritation   SODIUM CHLORIDE (OCEAN) 0.65 % NASAL SPRAY    Place 1 spray into the nose 2 (two)  times daily.    TROLAMINE SALICYLATE (ASPERCREME) 10 % CREAM    Apply 1 application topically 2 (two) times daily as needed for muscle pain.   WITCH HAZEL LIQD    Apply 1 application topically at bedtime as needed.  Modified Medications   No medications on file  Discontinued Medications   No medications on file    Physical Exam: There were no vitals filed for this visit.Physical Exam  Constitutional: She appears well-developed and well-nourished. No distress.  HENT:  Head: Normocephalic and atraumatic.  Nose: Rhinorrhea present. No mucosal edema or sinus tenderness. No epistaxis. Right sinus exhibits no maxillary sinus tenderness and no frontal sinus tenderness. Left sinus exhibits no maxillary sinus tenderness and no frontal sinus tenderness.  Mouth/Throat: Posterior oropharyngeal erythema present. No oropharyngeal exudate.  Cardiovascular: Normal rate, regular rhythm, normal heart sounds and intact distal pulses.   Pulmonary/Chest: Effort normal and breath sounds normal.  Abdominal: Soft. Bowel sounds are normal. She exhibits no distension.  Musculoskeletal:  hemiparesis  Lymphadenopathy:    She has cervical adenopathy (tendernes noted).  Neurological: She is alert.  Oriented but forgetful of the details of her care  Skin: Skin is warm and dry. She is not diaphoretic.  Psychiatric: She has a normal mood and affect.   Wt Readings from Last 3 Encounters:  09/23/14 162 lb 3.2 oz (73.573 kg)  08/10/14 164 lb (74.39 kg)  07/14/14 158 lb 11.2 oz (71.986 kg)     Labs reviewed: Basic Metabolic Panel:  Recent Labs  04/01/14 08/10/14  NA 141 136*  K 4.0 4.3  BUN 15 16  CREATININE 0.5 0.7    Liver Function Tests: No results for input(s): AST, ALT, ALKPHOS, BILITOT, PROT, ALBUMIN in the last 8760 hours.  CBC:  Recent Labs  12/20/13 04/01/14 08/10/14  WBC 8.6 6.1 10.0  HGB 12.2 11.6* 11.6*  HCT 38 35* 35*  PLT 189 171 184    Lab Results  Component Value Date   TSH  0.47 03/09/2014    Assessment/Plan  1) URI  -symptoms present off and on for 4 weeks -begin Augmentin 875 mg BID for 7 days  2)Allergic rhinitis  -complaints of itchy, watery eyes and using prn claritin on a regular basis, nurse asking for scheduled dose -claritin 10 mg daily     Cindi Carbon, [facility] [phone]

## 2014-11-02 ENCOUNTER — Encounter: Payer: Self-pay | Admitting: Internal Medicine

## 2014-11-02 ENCOUNTER — Ambulatory Visit (INDEPENDENT_AMBULATORY_CARE_PROVIDER_SITE_OTHER): Payer: Medicare Other | Admitting: Internal Medicine

## 2014-11-02 VITALS — BP 124/70 | HR 82 | Ht 61.0 in | Wt 164.0 lb

## 2014-11-02 DIAGNOSIS — I442 Atrioventricular block, complete: Secondary | ICD-10-CM | POA: Diagnosis not present

## 2014-11-02 DIAGNOSIS — Z45018 Encounter for adjustment and management of other part of cardiac pacemaker: Secondary | ICD-10-CM | POA: Diagnosis not present

## 2014-11-02 DIAGNOSIS — I6310 Cerebral infarction due to embolism of unspecified precerebral artery: Secondary | ICD-10-CM

## 2014-11-02 LAB — CUP PACEART INCLINIC DEVICE CHECK
Battery Voltage: 2.79 V
Brady Statistic AP VS Percent: 0 %
Brady Statistic AS VP Percent: 99 %
Brady Statistic AS VS Percent: 0 %
Date Time Interrogation Session: 20160830165702
Lead Channel Pacing Threshold Amplitude: 0.75 V
Lead Channel Pacing Threshold Amplitude: 0.75 V
Lead Channel Pacing Threshold Pulse Width: 0.4 ms
Lead Channel Pacing Threshold Pulse Width: 0.4 ms
Lead Channel Setting Pacing Amplitude: 2.5 V
MDC IDC MSMT BATTERY IMPEDANCE: 320 Ohm
MDC IDC MSMT BATTERY REMAINING LONGEVITY: 94 mo
MDC IDC MSMT LEADCHNL RA IMPEDANCE VALUE: 480 Ohm
MDC IDC MSMT LEADCHNL RA SENSING INTR AMPL: 2 mV
MDC IDC MSMT LEADCHNL RV IMPEDANCE VALUE: 437 Ohm
MDC IDC SET LEADCHNL RA PACING AMPLITUDE: 2 V
MDC IDC SET LEADCHNL RV PACING PULSEWIDTH: 0.4 ms
MDC IDC SET LEADCHNL RV SENSING SENSITIVITY: 2.8 mV
MDC IDC STAT BRADY AP VP PERCENT: 1 %

## 2014-11-02 NOTE — Patient Instructions (Signed)
Medication Instructions:  Your physician recommends that you continue on your current medications as directed. Please refer to the Current Medication list given to you today.  Labwork: None ordered  Testing/Procedures: None ordered  Follow-Up: Remote monitoring is used to monitor your Pacemaker of ICD from home. This monitoring reduces the number of office visits required to check your device to one time per year. It allows [place] to keep an eye on the functioning of your device to ensure it is working properly. You are scheduled for a device check from home on 02/01/15. You may send your transmission at any time that day. If you have a wireless device, the transmission will be sent automatically. After your physician reviews your transmission, you will receive a postcard with your next transmission date.  Your physician wants you to follow-up in: 1 year with Dr. Caryl Comes.  You will receive a reminder letter in the mail two months in advance. If you don't receive a letter, please call our office to schedule the follow-up appointment.  Any Other Special Instructions Will Be Listed Below (If Applicable). Thank you for choosing [facility]!!

## 2014-11-02 NOTE — Progress Notes (Signed)
Patient Care Team: [name], DO as PCP - General (Geriatric Medicine) [facility] Clarene Essex, MD as Attending Physician (Gastroenterology) Garvin Fila, MD as Consulting Physician (Neurology)   HPI  Rachel Cooke is a 81 y.o. female Seen to establish pacemaker followup. She underwent generator replacement in 2011 with a prior implantation in 2003  She has had intercurrent stroke which has left her nonambulatory. There is no associated atrial fibrillation detected on her device  There is no shortness of breath chest pain. She is nonambulatory.  Echo 2012 demonstrated normal LV function and mild aortic stenosis   Past Medical History  Diagnosis Date  . Hypertension   . Dendritic keratitis 2012  . Malignant neoplasm of breast (female), unspecified site 1970    S/P Rt radical mastectomy  . Unspecified hypothyroidism 2009  . Type II or unspecified type diabetes mellitus without mention of complication, not stated as uncontrolled 2003  . Unspecified vitamin D deficiency 2009  . Pure hyperglyceridemia 2004  . Anxiety state, unspecified   . Macular degeneration (senile) of retina, unspecified 2012  . Unspecified glaucoma 2013  . Tinnitus 2007  . Coronary atherosclerosis of native coronary artery 2003    non obstructive by Cath 2003  . Atrioventricular block, complete 2003    s/p PPM 2003, generator chnage 2011  . Allergic rhinitis due to pollen   . Acquired cyst of kidney 2002  . Osteoarthrosis, unspecified whether generalized or localized, unspecified site 2013  . Unspecified arthropathy, pelvic region and thigh 2010  . Senile osteoporosis 2003  . Abnormality of gait 2007  . Edema 2003  . Personal history of colonic polyps      s/p colonoscopy/polypectomy 2003  . Cardiac pacemaker in situ 2003  . CVA (cerebral infarction) 06/28/2012    rt hemiparesis, dysphagia  . NSTEMI (non-ST elevated myocardial infarction) 06/28/2012  . Urinary  retention 07/07/2012  . Pacemaker   . Stroke 06/28/2012  . Food impaction of esophagus 08/15/2012  . Esophageal dysmotility 08/19/2012  . Acute sinus infection 11/25/2012  . Altered mental status 11/28/2012  . Dysuria   . HTN (hypertension) 06/28/2012    Past Surgical History  Procedure Laterality Date  . Mastectomy Right 1970    Cancer  . Insert / replace / remove pacemaker    . Esophagogastroduodenoscopy N/A 08/15/2012    Procedure: ESOPHAGOGASTRODUODENOSCOPY (EGD);  Surgeon: [name], MD;  [facility];  Service: Endoscopy;  Laterality: N/A;  . Esophagoscopy N/A 08/15/2012    Procedure: ESOPHAGOSCOPY;  Surgeon: [name], MD;  Location: [facility];  Service: ENT;  Laterality: N/A;  . Foreign body removal esophageal N/A 08/15/2012    Procedure: REMOVAL FOREIGN BODY ESOPHAGEAL;  Surgeon: [name], MD;  Location: [facility] OR;  Service: ENT;  Laterality: N/A;  . Esophagogastroduodenoscopy N/A 08/15/2012    Procedure: ESOPHAGOGASTRODUODENOSCOPY (EGD);  Surgeon: [name], MD;  Location: [facility];  Service: Endoscopy;  Laterality: N/A;  . Esophagogastroduodenoscopy N/A 10/03/2012    Procedure: ESOPHAGOGASTRODUODENOSCOPY (EGD);  Surgeon: [name], MD;  Location: [facility] ENDOSCOPY;  Service: Endoscopy;  Laterality: N/A;  . Savory dilation N/A 10/03/2012    Procedure: SAVORY DILATION;  Surgeon: [name], MD;  Location: [facility] ENDOSCOPY;  Service: Endoscopy;  Laterality: N/A;  . Botox injection N/A 10/03/2012    Procedure: BOTOX INJECTION;  Surgeon: [name], MD;  Location: [facility] ENDOSCOPY;  Service: Endoscopy;  Laterality: N/A;  . Esophagogastroduodenoscopy  N/A 12/22/2012    Procedure: ESOPHAGOGASTRODUODENOSCOPY (EGD);  Surgeon: [name], MD;  Location: [facility] ENDOSCOPY;  Service: Endoscopy;  Laterality: N/A;    Current Outpatient Prescriptions  Medication Sig Dispense Refill  . acetaminophen (TYLENOL) 650 MG CR tablet Take 650 mg by mouth 2 (two) times daily. For pain    .  Camphor-Eucalyptus-Menthol (VICKS VAPORUB) 4.7-1.2-2.6 % OINT Apply 1 application topically at bedtime as needed.     . clopidogrel (PLAVIX) 75 MG tablet Take 75 mg by mouth daily. For stroke risk reduction    . escitalopram (LEXAPRO) 20 MG tablet Take 20 mg by mouth daily. For depression/ anxiety.    Marland Kitchen GLUCERNA (GLUCERNA) LIQD Take 237 mLs by mouth. At breakfast and lunch    . levothyroxine (SYNTHROID, LEVOTHROID) 75 MCG tablet Take 75 mcg by mouth daily before breakfast. For thyroid therapy    . liver oil-zinc oxide (DESITIN) 40 % ointment Apply 1 application topically as needed. Apply to rash on buttox/inner thighs after each BM for rash or irritation.    Marland Kitchen loratadine (CLARITIN) 10 MG tablet Take 10 mg by mouth daily.     Marland Kitchen losartan (COZAAR) 50 MG tablet Take 50 mg by mouth daily. For HTN    . Melatonin 5 MG TABS Take 5 tablets by mouth at bedtime.    . Menthol-Zinc Oxide (CALMOSEPTINE) 0.44-20.625 % OINT Apply 1 application topically as needed. Apply to peri-anal skin and inner buttox after each incontinent episode and PRN for irritation. .    . metoprolol tartrate (LOPRESSOR) 25 MG tablet Take 25 mg by mouth 2 (two) times daily.    . Multiple Vitamin (MULTIVITAMIN) tablet Take 1 tablet by mouth daily.    . Multiple Vitamins-Minerals (PRESERVISION AREDS PO) Take by mouth daily.    . NON FORMULARY Biotene Spray one spray before meals and at bedtime    . omeprazole (PRILOSEC) 20 MG capsule Take 20 mg by mouth daily. For GERD    . OVER THE COUNTER MEDICATION Take 1 spray by mouth 2 (two) times daily. Biotin spray    . Polyethyl Glycol-Propyl Glycol (SYSTANE) 0.4-0.3 % SOLN Apply 1 drop to eye 2 (two) times daily. One drop both eye every 2 hours as needed for dryness or irritation    . sodium chloride (OCEAN) 0.65 % nasal spray Place 1 spray into the nose 2 (two) times daily.     Marland Kitchen trolamine salicylate (ASPERCREME) 10 % cream Apply 1 application topically 2 (two) times daily as needed for muscle  pain.    Verlee Monte LIQD Apply 1 application topically at bedtime as needed.     No current facility-administered medications for this visit.    Allergies  Allergen Reactions  . Lactose Intolerance (Gi)     Review of Systems negative except from HPI and PMH  Physical Exam BP 124/70 mmHg  Pulse 82  Ht 5\' 1"  (1.549 m)  Wt 164 lb (74.39 kg)  BMI 31.00 kg/m2 Well developed and well nourished in no acute distress HENT normal E scleral and icterus clear Neck Supple JVP flat; carotids brisk and full Clear to ausculation Device pocket well healed; without hematoma or erythema.  There is no tethering  Regular rate and rhythm 2/6 systolic murmur   No clubbing cyanosis right upper extremity Edema Trace edema Alert and oriented, nonambulatory and wheelchair Skin Warm and Dry  ECG demonstrates P. synchronous pacing  Assessment and  Plan  Complete heart block  Pacemaker-[employer]  Stroke  No intercurrent  afib Euvolemic continue current meds

## 2014-11-16 DIAGNOSIS — L602 Onychogryphosis: Secondary | ICD-10-CM | POA: Diagnosis not present

## 2014-11-16 DIAGNOSIS — E1159 Type 2 diabetes mellitus with other circulatory complications: Secondary | ICD-10-CM | POA: Diagnosis not present

## 2014-11-16 DIAGNOSIS — L84 Corns and callosities: Secondary | ICD-10-CM | POA: Diagnosis not present

## 2014-11-19 ENCOUNTER — Encounter: Payer: Self-pay | Admitting: Adult Health

## 2014-11-19 ENCOUNTER — Non-Acute Institutional Stay (SKILLED_NURSING_FACILITY): Payer: Medicare Other | Admitting: Adult Health

## 2014-11-19 DIAGNOSIS — I69351 Hemiplegia and hemiparesis following cerebral infarction affecting right dominant side: Secondary | ICD-10-CM

## 2014-11-19 DIAGNOSIS — I214 Non-ST elevation (NSTEMI) myocardial infarction: Secondary | ICD-10-CM | POA: Diagnosis not present

## 2014-11-19 DIAGNOSIS — I69391 Dysphagia following cerebral infarction: Secondary | ICD-10-CM

## 2014-11-19 DIAGNOSIS — I69921 Dysphasia following unspecified cerebrovascular disease: Secondary | ICD-10-CM | POA: Diagnosis not present

## 2014-11-19 DIAGNOSIS — I6310 Cerebral infarction due to embolism of unspecified precerebral artery: Secondary | ICD-10-CM

## 2014-11-19 DIAGNOSIS — I10 Essential (primary) hypertension: Secondary | ICD-10-CM | POA: Diagnosis not present

## 2014-11-19 DIAGNOSIS — I69851 Hemiplegia and hemiparesis following other cerebrovascular disease affecting right dominant side: Secondary | ICD-10-CM

## 2014-11-19 DIAGNOSIS — E118 Type 2 diabetes mellitus with unspecified complications: Secondary | ICD-10-CM

## 2014-11-19 NOTE — Progress Notes (Signed)
Patient ID: Rachel Cooke, female   DOB: 05-15-1919, 81 y.o.   MRN: [id number]   [facility]  Code Status:  DNR Advanced Directive information    Patient Care Team: [name], DO as PCP - General (Geriatric Medicine) [facility] Clarene Essex, MD as Attending Physician (Gastroenterology) Garvin Fila, MD as Consulting Physician (Neurology)  Chief Complaint  Patient presents with  . Medical Management of Chronic Issues    HPI:  81 yo female long term care resident seen for medical mgt of her chronic diseases.  She has a h/o hemorrhoids, right hammer toe, DMII with circulatory complications, complete AV block s/p PPM, right hemiplegia, amnesia, primary OA, esophageal dyskinesia, NSTEMI,  dysphagia, htn, hyperlipidemia, major depressive disorder, and breast cancer.   She has no complaints today. Her weight has remained stable at 163 lbs.  She continues on pureed baby food consistency for her diet due to a hx of dysmotility and food impaction.  She has a hx of DM2, not on meds, CBGs range 104-109.   Functional status: She is non ambulatory due to her CVA and is using a stand up lift to the scooter or chair.   Review of Systems:  Review of Systems  Constitutional: Negative for fever and chills.  HENT: Negative for congestion.   Respiratory: Negative for cough, sputum production and shortness of breath.   Cardiovascular: Positive for leg swelling. Negative for chest pain and palpitations.  Gastrointestinal: Negative for abdominal pain, diarrhea, constipation and blood in stool.       Has hemorrhoids that have improved  Genitourinary: Negative for dysuria.  Musculoskeletal: Negative for joint pain.  Skin: Negative for rash.  Neurological: Positive for focal weakness. Negative for dizziness.  Psychiatric/Behavioral: Positive for memory loss. Negative for depression.    Medications: Patient's Medications  New  Prescriptions   No medications on file  Previous Medications   ACETAMINOPHEN (TYLENOL) 650 MG CR TABLET    Take 650 mg by mouth 2 (two) times daily. For pain   CAMPHOR-EUCALYPTUS-MENTHOL (VICKS VAPORUB) 4.7-1.2-2.6 % OINT    Apply 1 application topically at bedtime as needed.    CLOPIDOGREL (PLAVIX) 75 MG TABLET    Take 75 mg by mouth daily. For stroke risk reduction   ESCITALOPRAM (LEXAPRO) 20 MG TABLET    Take 20 mg by mouth daily. For depression/ anxiety.   GLUCERNA (GLUCERNA) LIQD    Take 237 mLs by mouth. At breakfast and lunch   LEVOTHYROXINE (SYNTHROID, LEVOTHROID) 75 MCG TABLET    Take 75 mcg by mouth daily before breakfast. For thyroid therapy   LIVER OIL-ZINC OXIDE (DESITIN) 40 % OINTMENT    Apply 1 application topically as needed. Apply to rash on buttox/inner thighs after each BM for rash or irritation.   LORATADINE (CLARITIN) 10 MG TABLET    Take 10 mg by mouth daily.    LOSARTAN (COZAAR) 50 MG TABLET    Take 50 mg by mouth daily. For HTN   MELATONIN 5 MG TABS    Take 5 tablets by mouth at bedtime.   MENTHOL-ZINC OXIDE (CALMOSEPTINE) 0.44-20.625 % OINT    Apply 1 application topically as needed. Apply to peri-anal skin and inner buttox after each incontinent episode and PRN for irritation. Marland Kitchen   METOPROLOL TARTRATE (LOPRESSOR) 25 MG TABLET    Take 25 mg by mouth 2 (two) times daily.   MULTIPLE VITAMIN (MULTIVITAMIN) TABLET    Take 1 tablet by mouth daily.  MULTIPLE VITAMINS-MINERALS (PRESERVISION AREDS PO)    Take by mouth daily.   NON FORMULARY    Biotene Spray one spray before meals and at bedtime   OMEPRAZOLE (PRILOSEC) 20 MG CAPSULE    Take 20 mg by mouth daily. For GERD   OVER THE COUNTER MEDICATION    Take 1 spray by mouth 2 (two) times daily. Biotin spray   POLYETHYL GLYCOL-PROPYL GLYCOL (SYSTANE) 0.4-0.3 % SOLN    Apply 1 drop to eye 2 (two) times daily. One drop both eye every 2 hours as needed for dryness or irritation   SODIUM CHLORIDE (OCEAN) 0.65 % NASAL SPRAY    Place  1 spray into the nose 2 (two) times daily.    TROLAMINE SALICYLATE (ASPERCREME) 10 % CREAM    Apply 1 application topically 2 (two) times daily as needed for muscle pain.   WITCH HAZEL LIQD    Apply 1 application topically at bedtime as needed.  Modified Medications   No medications on file  Discontinued Medications   No medications on file    Physical Exam: Filed Vitals:   11/19/14 1101  BP: 117/68  Pulse: 88  Temp: 97.8 F (36.6 C)  Resp: 17  Weight: 163 lb (73.936 kg)  SpO2: 96%  Physical Exam  Constitutional: She is oriented to person, place, and time. She appears well-developed and well-nourished. No distress.  HENT:  Head: Normocephalic and atraumatic.  Cardiovascular: Normal rate, regular rhythm, normal heart sounds and intact distal pulses.   Pulmonary/Chest: Effort normal.  Rales to both bases  Abdominal: Soft. Bowel sounds are normal. She exhibits no distension.  Musculoskeletal:  R hemiparesis  Neurological: She is alert and oriented to person, place, and time.  Oriented but forgetful of the details of her care  Skin: Skin is warm and dry. She is not diaphoretic.  Psychiatric: She has a normal mood and affect.   Wt Readings from Last 3 Encounters:  11/19/14 163 lb (73.936 kg)  11/02/14 164 lb (74.39 kg)  09/23/14 162 lb 3.2 oz (73.573 kg)     Labs reviewed: Basic Metabolic Panel:  Recent Labs  04/01/14 08/10/14  NA 141 136*  K 4.0 4.3  BUN 15 16  CREATININE 0.5 0.7    Liver Function Tests: No results for input(s): AST, ALT, ALKPHOS, BILITOT, PROT, ALBUMIN in the last 8760 hours.  CBC:  Recent Labs  12/20/13 04/01/14 08/10/14  WBC 8.6 6.1 10.0  HGB 12.2 11.6* 11.6*  HCT 38 35* 35*  PLT 189 171 184    Lab Results  Component Value Date   TSH 0.47 03/09/2014   Lab Results  Component Value Date   HGBA1C 6.0 08/11/2013    Assessment/Plan  1. Essential hypertension -controlled, continue current meds -BMP q 6 month  2. Dysphagia due  to recent stroke -stable, continue current diet  3. NSTEMI (non-ST elevated myocardial infarction) -no new events, remains on plavix  4. Hemiparesis affecting right side as late effect of cerebrovascular accident -needs help with ADL's, no spasticity or contracture  5. Cerebral infarction due to embolism of precerebral artery -no new events, on plavix -not on statin due to age  76. Diabetes mellitus type 2, controlled, with complications -controlled with diet -check A1C with labs in Dec -continue CBG monitoring but change to q [place], [place] [phone]

## 2014-12-08 DIAGNOSIS — Z23 Encounter for immunization: Secondary | ICD-10-CM | POA: Diagnosis not present

## 2014-12-21 ENCOUNTER — Non-Acute Institutional Stay (SKILLED_NURSING_FACILITY): Payer: Medicare Other | Admitting: Internal Medicine

## 2014-12-21 DIAGNOSIS — J069 Acute upper respiratory infection, unspecified: Secondary | ICD-10-CM | POA: Diagnosis not present

## 2014-12-21 NOTE — Progress Notes (Signed)
Patient ID: Rachel Cooke, female   DOB: 23-Mar-1919, 81 y.o.   MRN: [id number]  Location:  [facility] Provider:  Khylen Riolo L. Kyler Germer, D.O., C.M.D. Hollace Kinnier, DO  Code Status:  DNR Goals of care: Advanced Directive information Does patient have an advance directive?: Yes, Type of Advance Directive: [place];Living will;Out of facility DNR (pink MOST or yellow form), Pre-existing out of facility DNR order (yellow form or pink MOST form): Yellow form placed in chart (order not valid for inpatient use)  Chief Complaint  Patient presents with  . Acute Visit    cough, runny nose, stuffy head, fever, wheezing noted by one of the nursing staff    HPI:  Pt is a 81 y.o. white female seen for an acute visit due to cough, runny nose, stuffy head, fever and one of staff had heard her wheezing.  Head congestion began three days ago and she's had some sore throat.  She is now afebrile. She feels lousy. Her daughter was visiting her when I saw her in her room.    Review of Systems  Constitutional: Positive for fever, chills and malaise/fatigue.  HENT: Positive for congestion and sore throat. Negative for ear pain.   Respiratory: Positive for cough and wheezing. Negative for sputum production and shortness of breath.   Cardiovascular: Negative for chest pain.  Gastrointestinal: Negative for nausea and vomiting.  Genitourinary: Negative for dysuria and flank pain.  Musculoskeletal: Negative for falls.  Neurological: Negative for headaches.       Prior stroke with hemiparesis and dysphagia  Psychiatric/Behavioral: Positive for memory loss.    Past Medical History  Diagnosis Date  . Hypertension   . Dendritic keratitis 2012  . Malignant neoplasm of breast (female), unspecified site 1970    S/P Rt radical mastectomy  . Unspecified hypothyroidism 2009  . Type II or unspecified type diabetes mellitus without mention of complication, not stated as uncontrolled 2003  .  Unspecified vitamin D deficiency 2009  . Pure hyperglyceridemia 2004  . Anxiety state, unspecified   . Macular degeneration (senile) of retina, unspecified 2012  . Unspecified glaucoma 2013  . Tinnitus 2007  . Coronary atherosclerosis of native coronary artery 2003    non obstructive by Cath 2003  . Atrioventricular block, complete ([place]) 2003    s/p PPM 2003, generator chnage 2011  . Allergic rhinitis due to pollen   . Acquired cyst of kidney 2002  . Osteoarthrosis, unspecified whether generalized or localized, unspecified site 2013  . Unspecified arthropathy, pelvic region and thigh 2010  . Senile osteoporosis 2003  . Abnormality of gait 2007  . Edema 2003  . Personal history of colonic polyps      s/p colonoscopy/polypectomy 2003  . Cardiac pacemaker in situ 2003  . CVA (cerebral infarction) 06/28/2012    rt hemiparesis, dysphagia  . NSTEMI (non-ST elevated myocardial infarction) ([place]) 06/28/2012  . Urinary retention 07/07/2012  . Pacemaker   . Stroke ([place]) 06/28/2012  . Food impaction of esophagus 08/15/2012  . Esophageal dysmotility 08/19/2012  . Acute sinus infection 11/25/2012  . Altered mental status 11/28/2012  . Dysuria   . HTN (hypertension) 06/28/2012   Past Surgical History  Procedure Laterality Date  . Mastectomy Right 1970    Cancer  . Insert / replace / remove pacemaker    . Esophagogastroduodenoscopy N/A 08/15/2012    Procedure: ESOPHAGOGASTRODUODENOSCOPY (EGD);  Surgeon: [name], MD;  Location: [facility] ENDOSCOPY;  Service: Endoscopy;  Laterality: N/A;  .  Esophagoscopy N/A 08/15/2012    Procedure: ESOPHAGOSCOPY;  Surgeon: [name], MD;  Location: [facility] OR;  Service: ENT;  Laterality: N/A;  . Foreign body removal esophageal N/A 08/15/2012    Procedure: REMOVAL FOREIGN BODY ESOPHAGEAL;  Surgeon: [name], MD;  Location: [facility] OR;  Service: ENT;  Laterality: N/A;  . Esophagogastroduodenoscopy N/A 08/15/2012    Procedure: ESOPHAGOGASTRODUODENOSCOPY (EGD);  Surgeon: [name], MD;  Location: [facility];  Service: Endoscopy;  Laterality: N/A;  . Esophagogastroduodenoscopy N/A 10/03/2012    Procedure: ESOPHAGOGASTRODUODENOSCOPY (EGD);  Surgeon: [name], MD;  Location: [facility]COPY;  Service: Endoscopy;  Laterality: N/A;  . Savory dilation N/A 10/03/2012    Procedure: SAVORY DILATION;  Surgeon: [name], MD;  Location: [facility] ENDOSCOPY;  Service: Endoscopy;  Laterality: N/A;  . Botox injection N/A 10/03/2012    Procedure: BOTOX INJECTION;  Surgeon: [name], MD;  Location: [facility] ENDOSCOPY;  Service: Endoscopy;  Laterality: N/A;  . Esophagogastroduodenoscopy N/A 12/22/2012    Procedure: ESOPHAGOGASTRODUODENOSCOPY (EGD);  Surgeon: [name], MD;  Location: [facility] ENDOSCOPY;  Service: Endoscopy;  Laterality: N/A;    Allergies  Allergen Reactions  . Lactose Intolerance (Gi)       Medication List       This list is accurate as of: 12/21/14 11:59 PM.  Always use your most recent med list.               acetaminophen 650 MG CR tablet  Commonly known as:  TYLENOL  Take 650 mg by mouth 2 (two) times daily. For pain     CALMOSEPTINE 0.44-20.625 % Oint  Generic drug:  Menthol-Zinc Oxide  Apply 1 application topically as needed. Apply to peri-anal skin and inner buttox after each incontinent episode and PRN for irritation. .     clopidogrel 75 MG tablet  Commonly known as:  PLAVIX  Take 75 mg by mouth daily. For stroke risk reduction     escitalopram 20 MG tablet  Commonly known as:  LEXAPRO  Take 20 mg by mouth daily. For depression/ anxiety.     GLUCERNA Liqd  Take 237 mLs by mouth. At breakfast and lunch     levothyroxine 75 MCG tablet  Commonly known as:  SYNTHROID, LEVOTHROID  Take 75 mcg by mouth daily before breakfast. For thyroid therapy     liver oil-zinc oxide 40 % ointment  Commonly known as:  DESITIN  Apply 1 application topically as needed. Apply to rash on buttox/inner thighs after each BM for rash or irritation.     loratadine 10 MG  tablet  Commonly known as:  CLARITIN  Take 10 mg by mouth daily.     losartan 50 MG tablet  Commonly known as:  COZAAR  Take 50 mg by mouth daily. For HTN     Melatonin 5 MG Tabs  Take 5 tablets by mouth at bedtime.     metoprolol tartrate 25 MG tablet  Commonly known as:  LOPRESSOR  Take 25 mg by mouth 2 (two) times daily.     multivitamin tablet  Take 1 tablet by mouth daily.     NON FORMULARY  Biotene Spray one spray before meals and at bedtime     omeprazole 20 MG capsule  Commonly known as:  PRILOSEC  Take 20 mg by mouth daily. For GERD     OVER THE COUNTER MEDICATION  Take 1 spray by mouth 2 (two) times daily. Biotin spray  PRESERVISION AREDS PO  Take by mouth daily.     sodium chloride 0.65 % nasal spray  Commonly known as:  OCEAN  Place 1 spray into the nose 2 (two) times daily.     SYSTANE 0.4-0.3 % Soln  Generic drug:  Polyethyl Glycol-Propyl Glycol  Apply 1 drop to eye 2 (two) times daily. One drop both eye every 2 hours as needed for dryness or irritation     trolamine salicylate 10 % cream  Commonly known as:  ASPERCREME  Apply 1 application topically 2 (two) times daily as needed for muscle pain.     VICKS VAPORUB 4.7-1.2-2.6 % Oint  Apply 1 application topically at bedtime as needed.     Witch Hazel Liqd  Apply 1 application topically at bedtime as needed.        Immunization History  Administered Date(s) Administered  . Influenza Whole 12/17/2012  . Influenza-Unspecified 12/08/2013, 12/16/2014  . PPD Test 07/18/2012   Pertinent  Health Maintenance Due  Topic Date Due  . FOOT EXAM  10/30/1931  . OPHTHALMOLOGY EXAM  10/30/1931  . URINE MICROALBUMIN  10/30/1931  . DEXA SCAN  10/30/1986  . PNA vac Low Risk Adult (1 of 2 - PCV13) 10/30/1986  . HEMOGLOBIN A1C  02/10/2014  . INFLUENZA VACCINE  10/04/2015   No flowsheet data found.  Filed Vitals:   12/21/14 0625  BP: 135/72  Pulse: 85  Temp: 97.8 F (36.6 C)  Resp: 18  SpO2: 94%     There is no weight on file to calculate BMI. Physical Exam  Constitutional: She appears well-developed and well-nourished. No distress.  Cardiovascular: Normal rate, regular rhythm and normal heart sounds.   Pulmonary/Chest: Effort normal.  Few scattered wheezes and rhonchi; no diminished breath sounds, however  Abdominal: Soft. Bowel sounds are normal.  Neurological: She is alert.  Skin: Skin is warm and dry.  Psychiatric: She has a normal mood and affect.    Labs reviewed:  Recent Labs  04/01/14 08/10/14  NA 141 136*  K 4.0 4.3  BUN 15 16  CREATININE 0.5 0.7   No results for input(s): AST, ALT, ALKPHOS, BILITOT, PROT, ALBUMIN in the last 8760 hours.  Recent Labs  04/01/14 08/10/14  WBC 6.1 10.0  HGB 11.6* 11.6*  HCT 35* 35*  PLT 171 184   Lab Results  Component Value Date   TSH 0.47 03/09/2014   Lab Results  Component Value Date   HGBA1C 6.0 08/11/2013   Lab Results  Component Value Date   CHOL 155 08/11/2013   HDL 38 08/11/2013   LDLCALC 96 08/11/2013   TRIG 196* 08/11/2013   CHOLHDL 4.1 07/01/2012    Significant Diagnostic Results in last 30 days:  No results found.  Assessment/Plan 1. Acute upper respiratory infection -push po fluids especially water, some tea is ok, but primarily water -mucinex 600mg  po bid x 7 days -humidifier at hs for 7 days -d/c DM robitussin which can elevate bp -use coricidin bp 2tsp qhs for 7 days -notify MD/NP if fever recurs or she is not getting better in the next 3 days  Family/ staff Communication: discussed with her daughter, snf nurse and nurse manager Labs/tests ordered:  None  Sanda Dejoy L. Tell Rozelle, D.O. [facility] [address],  [address] Cell Phone (Mon-Fri 8am-5pm):  [phone] On Call:  [phone] & follow prompts after 5pm & weekends Office Phone:  [phone] Office Fax:  [phone]

## 2014-12-24 ENCOUNTER — Non-Acute Institutional Stay (SKILLED_NURSING_FACILITY): Payer: Medicare Other | Admitting: Adult Health

## 2014-12-24 DIAGNOSIS — R05 Cough: Secondary | ICD-10-CM | POA: Diagnosis not present

## 2014-12-24 DIAGNOSIS — R059 Cough, unspecified: Secondary | ICD-10-CM

## 2014-12-24 NOTE — Progress Notes (Signed)
Patient ID: Rachel Cooke, female   DOB: [birth year], 81 y.o.   MRN: [id number]   [facility]  Code Status:  DNR     Patient Care Team: [name], DO as PCP - General (Geriatric Medicine) [facility] Clarene Essex, MD as Attending Physician (Gastroenterology) Garvin Fila, MD as Consulting Physician (Neurology)  Chief Complaint  Patient presents with  . Acute Visit    cough and congestion    HPI:  81 yo female long term care resident seen for evaluation of cough and congestion.  She has a h/o hemorrhoids, right hammer toe, DMII with circulatory complications, complete AV block s/p PPM, right hemiplegia s/p CVA, amnesia, primary OA, esophageal dyskinesia, NSTEMI,  dysphagia, htn, hyperlipidemia, major depressive disorder, and breast cancer.   I was asked to see her today for a cough with yellow sputum, wheezing, rhonchi in both lungs, and general malaise, present for 7 days. She has tried a humidifier and cough syrup but feels she is getting worse. She is aching all over but denies SOB or CP. She has not had a fever.   Functional status: She is non ambulatory due to her CVA and is using a stand up lift to the scooter or chair.   Review of Systems:  Review of Systems  Constitutional: Negative for fever, chills, malaise/fatigue and diaphoresis.  HENT: Positive for congestion. Negative for ear discharge, hearing loss and sore throat.   Respiratory: Positive for cough, sputum production and wheezing. Negative for shortness of breath.   Cardiovascular: Positive for leg swelling. Negative for chest pain and palpitations.  Genitourinary: Negative for dysuria.  Musculoskeletal: Negative for joint pain.  Skin: Negative for rash.  Neurological: Positive for focal weakness. Negative for dizziness and headaches.  Psychiatric/Behavioral: Positive for memory loss. Negative for depression.    Medications: Patient's Medications  New  Prescriptions   No medications on file  Previous Medications   ACETAMINOPHEN (TYLENOL) 650 MG CR TABLET    Take 650 mg by mouth 2 (two) times daily. For pain   CAMPHOR-EUCALYPTUS-MENTHOL (VICKS VAPORUB) 4.7-1.2-2.6 % OINT    Apply 1 application topically at bedtime as needed.    CLOPIDOGREL (PLAVIX) 75 MG TABLET    Take 75 mg by mouth daily. For stroke risk reduction   ESCITALOPRAM (LEXAPRO) 20 MG TABLET    Take 20 mg by mouth daily. For depression/ anxiety.   GLUCERNA (GLUCERNA) LIQD    Take 237 mLs by mouth. At breakfast and lunch   LEVOTHYROXINE (SYNTHROID, LEVOTHROID) 75 MCG TABLET    Take 75 mcg by mouth daily before breakfast. For thyroid therapy   LIVER OIL-ZINC OXIDE (DESITIN) 40 % OINTMENT    Apply 1 application topically as needed. Apply to rash on buttox/inner thighs after each BM for rash or irritation.   LORATADINE (CLARITIN) 10 MG TABLET    Take 10 mg by mouth daily.    LOSARTAN (COZAAR) 50 MG TABLET    Take 50 mg by mouth daily. For HTN   MELATONIN 5 MG TABS    Take 5 tablets by mouth at bedtime.   MENTHOL-ZINC OXIDE (CALMOSEPTINE) 0.44-20.625 % OINT    Apply 1 application topically as needed. Apply to peri-anal skin and inner buttox after each incontinent episode and PRN for irritation. Marland Kitchen   METOPROLOL TARTRATE (LOPRESSOR) 25 MG TABLET    Take 25 mg by mouth 2 (two) times daily.   MULTIPLE VITAMIN (MULTIVITAMIN) TABLET    Take 1  tablet by mouth daily.   MULTIPLE VITAMINS-MINERALS (PRESERVISION AREDS PO)    Take by mouth daily.   NON FORMULARY    Biotene Spray one spray before meals and at bedtime   OMEPRAZOLE (PRILOSEC) 20 MG CAPSULE    Take 20 mg by mouth daily. For GERD   OVER THE COUNTER MEDICATION    Take 1 spray by mouth 2 (two) times daily. Biotin spray   POLYETHYL GLYCOL-PROPYL GLYCOL (SYSTANE) 0.4-0.3 % SOLN    Apply 1 drop to eye 2 (two) times daily. One drop both eye every 2 hours as needed for dryness or irritation   SODIUM CHLORIDE (OCEAN) 0.65 % NASAL SPRAY    Place  1 spray into the nose 2 (two) times daily.    TROLAMINE SALICYLATE (ASPERCREME) 10 % CREAM    Apply 1 application topically 2 (two) times daily as needed for muscle pain.   WITCH HAZEL LIQD    Apply 1 application topically at bedtime as needed.  Modified Medications   No medications on file  Discontinued Medications   No medications on file    Physical Exam: Filed Vitals:   12/24/14 1205  BP: 101/64  Pulse: 81  Temp: 97.4 F (36.3 C)  Resp: 18  SpO2: 96%  Physical Exam  Constitutional: She is oriented to person, place, and time. She appears well-developed and well-nourished. No distress.  HENT:  Head: Normocephalic and atraumatic.  Nose: Nose normal.  Mouth/Throat: Oropharynx is clear and moist. No oropharyngeal exudate.  Eyes: Conjunctivae are normal. Pupils are equal, round, and reactive to light. Right eye exhibits no discharge. Left eye exhibits no discharge.  Neck: Normal range of motion. Neck supple. No JVD present. No tracheal deviation present.  Cardiovascular: Normal rate, regular rhythm, normal heart sounds and intact distal pulses.   Pulmonary/Chest: Effort normal. No stridor. No respiratory distress. She has wheezes. She has rales.  Rhonchi to bilateral upper lobes  Abdominal: Soft. Bowel sounds are normal. She exhibits no distension.  Musculoskeletal:  R hemiparesis  Lymphadenopathy:    She has cervical adenopathy.  Neurological: She is alert and oriented to person, place, and time.  Oriented but forgetful of the details of her care  Skin: Skin is warm and dry. She is not diaphoretic.  Psychiatric: She has a normal mood and affect.   Wt Readings from Last 3 Encounters:  11/19/14 163 lb (73.936 kg)  11/02/14 164 lb (74.39 kg)  09/23/14 162 lb 3.2 oz (73.573 kg)     Labs reviewed: Basic Metabolic Panel:  Recent Labs  04/01/14 08/10/14  NA 141 136*  K 4.0 4.3  BUN 15 16  CREATININE 0.5 0.7    Liver Function Tests: No results for input(s): AST, ALT,  ALKPHOS, BILITOT, PROT, ALBUMIN in the last 8760 hours.  CBC:  Recent Labs  04/01/14 08/10/14  WBC 6.1 10.0  HGB 11.6* 11.6*  HCT 35* 35*  PLT 171 184    Lab Results  Component Value Date   TSH 0.47 03/09/2014   Lab Results  Component Value Date   HGBA1C 6.0 08/11/2013    Assessment/Plan  1) Cough -Duoneb TID -check CXR to rule out pna -continue Coricidin cough syrup and mucinex     Cindi Carbon, ANP [facility] [phone]

## 2014-12-27 ENCOUNTER — Encounter: Payer: Self-pay | Admitting: Adult Health

## 2014-12-27 ENCOUNTER — Non-Acute Institutional Stay (SKILLED_NURSING_FACILITY): Payer: Medicare Other | Admitting: Adult Health

## 2014-12-27 DIAGNOSIS — R05 Cough: Secondary | ICD-10-CM

## 2014-12-27 DIAGNOSIS — R059 Cough, unspecified: Secondary | ICD-10-CM

## 2014-12-27 NOTE — Progress Notes (Signed)
Patient ID: Rachel Cooke, female   DOB: 1919/03/30, 81 y.o.   MRN: [id number]   [facility]  Code Status:  DNR     Patient Care Team: [name], DO as PCP - General (Geriatric Medicine) [facility] Clarene Essex, MD as Attending Physician (Gastroenterology) Garvin Fila, MD as Consulting Physician (Neurology)  Chief Complaint  Patient presents with  . Acute Visit    cough no better    HPI:  81 yo female long term care resident seen for evaluation of cough and congestion.  She has a h/o hemorrhoids, right hammer toe, DMII with circulatory complications, complete AV block s/p PPM, right hemiplegia s/p CVA, amnesia, primary OA, esophageal dyskinesia, NSTEMI,  dysphagia, htn, hyperlipidemia, major depressive disorder, and breast cancer.   She had a cough for 7 days with wheezing, rhonchi in both lungs, with worsening symptoms on 10/21. An xray was obtained with no acute abnormality. She was given Augmentin with Florastor for worsening symptoms.  She is on day 10 of her illness and still feels weak, tired, with continued congested cough but no SOB or fever. Over the weekend she apparently wanted all of her meds stopped and was ready to die. She has a better outlook today but overall is generally not well. She has not had any issues swallowing her food. She does have a hx of esophageal impaction but has no issues at this point.   Functional status: She is non ambulatory due to her CVA and is using a stand up lift to the scooter or chair.   Review of Systems:  Review of Systems  Constitutional: Negative for fever, chills, malaise/fatigue and diaphoresis.  HENT: Positive for congestion. Negative for ear discharge, hearing loss and sore throat.   Respiratory: Positive for cough, sputum production and wheezing. Negative for shortness of breath.   Cardiovascular: Positive for leg swelling. Negative for chest pain and palpitations.    Genitourinary: Negative for dysuria.  Musculoskeletal: Negative for joint pain.  Skin: Negative for rash.  Neurological: Positive for focal weakness. Negative for dizziness and headaches.  Psychiatric/Behavioral: Positive for memory loss. Negative for depression.    Medications: Patient's Medications  New Prescriptions   No medications on file  Previous Medications   ACETAMINOPHEN (TYLENOL) 650 MG CR TABLET    Take 650 mg by mouth 2 (two) times daily. For pain   CAMPHOR-EUCALYPTUS-MENTHOL (VICKS VAPORUB) 4.7-1.2-2.6 % OINT    Apply 1 application topically at bedtime as needed.    CLOPIDOGREL (PLAVIX) 75 MG TABLET    Take 75 mg by mouth daily. For stroke risk reduction   ESCITALOPRAM (LEXAPRO) 20 MG TABLET    Take 20 mg by mouth daily. For depression/ anxiety.   GLUCERNA (GLUCERNA) LIQD    Take 237 mLs by mouth. At breakfast and lunch   LEVOTHYROXINE (SYNTHROID, LEVOTHROID) 75 MCG TABLET    Take 75 mcg by mouth daily before breakfast. For thyroid therapy   LIVER OIL-ZINC OXIDE (DESITIN) 40 % OINTMENT    Apply 1 application topically as needed. Apply to rash on buttox/inner thighs after each BM for rash or irritation.   LORATADINE (CLARITIN) 10 MG TABLET    Take 10 mg by mouth daily.    LOSARTAN (COZAAR) 50 MG TABLET    Take 50 mg by mouth daily. For HTN   MELATONIN 5 MG TABS    Take 5 tablets by mouth at bedtime.   MENTHOL-ZINC OXIDE (CALMOSEPTINE) 0.44-20.625 % OINT  Apply 1 application topically as needed. Apply to peri-anal skin and inner buttox after each incontinent episode and PRN for irritation. Marland Kitchen   METOPROLOL TARTRATE (LOPRESSOR) 25 MG TABLET    Take 25 mg by mouth 2 (two) times daily.   MULTIPLE VITAMIN (MULTIVITAMIN) TABLET    Take 1 tablet by mouth daily.   MULTIPLE VITAMINS-MINERALS (PRESERVISION AREDS PO)    Take by mouth daily.   NON FORMULARY    Biotene Spray one spray before meals and at bedtime   OMEPRAZOLE (PRILOSEC) 20 MG CAPSULE    Take 20 mg by mouth daily. For  GERD   OVER THE COUNTER MEDICATION    Take 1 spray by mouth 2 (two) times daily. Biotin spray   POLYETHYL GLYCOL-PROPYL GLYCOL (SYSTANE) 0.4-0.3 % SOLN    Apply 1 drop to eye 2 (two) times daily. One drop both eye every 2 hours as needed for dryness or irritation   SODIUM CHLORIDE (OCEAN) 0.65 % NASAL SPRAY    Place 1 spray into the nose 2 (two) times daily.    TROLAMINE SALICYLATE (ASPERCREME) 10 % CREAM    Apply 1 application topically 2 (two) times daily as needed for muscle pain.   WITCH HAZEL LIQD    Apply 1 application topically at bedtime as needed.  Modified Medications   No medications on file  Discontinued Medications   No medications on file    Physical Exam: Filed Vitals:   12/27/14 1353  BP: 108/69  Pulse: 89  Temp: 97.7 F (36.5 C)  Resp: 19  SpO2: 96%  Physical Exam  Constitutional: She is oriented to person, place, and time. She appears well-developed and well-nourished. No distress.  HENT:  Head: Normocephalic and atraumatic.  Nose: Nose normal.  Mouth/Throat: Oropharynx is clear and moist. No oropharyngeal exudate.  Eyes: Conjunctivae are normal. Pupils are equal, round, and reactive to light. Right eye exhibits no discharge. Left eye exhibits no discharge.  Neck: Normal range of motion. Neck supple. No JVD present. No tracheal deviation present.  Cardiovascular: Normal rate, regular rhythm, normal heart sounds and intact distal pulses.   Pulmonary/Chest: Effort normal. No stridor. No respiratory distress. She has no wheezes. She has rales.  Rhonchi to bilateral upper lobes improved  Abdominal: Soft. Bowel sounds are normal. She exhibits no distension.  Musculoskeletal:  R hemiparesis  Lymphadenopathy:    She has cervical adenopathy.  Neurological: She is alert and oriented to person, place, and time.  Oriented but forgetful of the details of her care  Skin: Skin is warm and dry. She is not diaphoretic.  Psychiatric: She has a normal mood and affect.   Wt  Readings from Last 3 Encounters:  11/19/14 163 lb (73.936 kg)  11/02/14 164 lb (74.39 kg)  09/23/14 162 lb 3.2 oz (73.573 kg)     Labs reviewed: Basic Metabolic Panel:  Recent Labs  04/01/14 08/10/14  NA 141 136*  K 4.0 4.3  BUN 15 16  CREATININE 0.5 0.7    Liver Function Tests: No results for input(s): AST, ALT, ALKPHOS, BILITOT, PROT, ALBUMIN in the last 8760 hours.  CBC:  Recent Labs  04/01/14 08/10/14  WBC 6.1 10.0  HGB 11.6* 11.6*  HCT 35* 35*  PLT 171 184    Lab Results  Component Value Date   TSH 0.47 03/09/2014   Lab Results  Component Value Date   HGBA1C 6.0 08/11/2013    Assessment/Plan 1) Cough -no improvement with Augmentin with continued feelings of malaise,  congestion, bodyaches, feels that she is getting worse -change to levaquin 500 mg BID for 7 days -continue florastor -continue duonebs and humidifier - no need for another xray, they have not been helpful in this siuation     Cindi Carbon, [place] [phone]

## 2015-01-03 ENCOUNTER — Encounter: Payer: Self-pay | Admitting: Internal Medicine

## 2015-01-13 NOTE — Progress Notes (Signed)
This encounter was created in error - please disregard.

## 2015-01-24 ENCOUNTER — Non-Acute Institutional Stay (SKILLED_NURSING_FACILITY): Payer: Medicare Other | Admitting: Adult Health

## 2015-01-24 ENCOUNTER — Encounter: Payer: Self-pay | Admitting: Adult Health

## 2015-01-24 DIAGNOSIS — E038 Other specified hypothyroidism: Secondary | ICD-10-CM | POA: Diagnosis not present

## 2015-01-24 DIAGNOSIS — I10 Essential (primary) hypertension: Secondary | ICD-10-CM

## 2015-01-24 DIAGNOSIS — I679 Cerebrovascular disease, unspecified: Secondary | ICD-10-CM | POA: Diagnosis not present

## 2015-01-24 DIAGNOSIS — E118 Type 2 diabetes mellitus with unspecified complications: Secondary | ICD-10-CM

## 2015-01-24 NOTE — Progress Notes (Signed)
Patient ID: Rachel Cooke, female   DOB: [birth year], 81 y.o.   MRN: [id number]   [facility]  Code Status:  DNR Advanced Directive information    Patient Care Team: [name], DO as PCP - General (Geriatric Medicine) [facility] Clarene Essex, MD as Attending Physician (Gastroenterology) Garvin Fila, MD as Consulting Physician (Neurology)  Chief Complaint  Patient presents with  . Medical Management of Chronic Issues    HPI:  81 yo female long term care resident seen for medical mgt of her chronic diseases.  She has a h/o hemorrhoids, right hammer toe, DMII with circulatory complications, complete AV block s/p PPM, right hemiplegia, amnesia, primary OA, esophageal dyskinesia, NSTEMI,  dysphagia, htn, hyperlipidemia, major depressive disorder, and breast cancer.   She has no complaints today. Her weight has remained stable at 165 lbs.  She continues on pureed baby food consistency for her diet due to a hx of dysmotility and food impaction.  She has a hx of DM2, not on meds, CBGs range 107-109. Treated for a difficult to control cough on 10/24 with levaquin. Symptoms have resolved with residual throat clearing.   Functional status: She is non ambulatory due to her CVA and is using a stand up lift to the scooter or chair.   Review of Systems:  Review of Systems  Constitutional: Negative for fever and chills.  HENT: Negative for congestion.   Respiratory: Negative for cough, sputum production and shortness of breath.   Cardiovascular: Positive for leg swelling. Negative for chest pain and palpitations.  Gastrointestinal: Negative for abdominal pain, diarrhea, constipation and blood in stool.  Genitourinary: Negative for dysuria.  Musculoskeletal: Negative for joint pain.  Skin: Negative for rash.  Neurological: Positive for focal weakness. Negative for dizziness.  Psychiatric/Behavioral: Positive for memory loss.  Negative for depression.    Medications: Patient's Medications  New Prescriptions   No medications on file  Previous Medications   ACETAMINOPHEN (TYLENOL) 650 MG CR TABLET    Take 650 mg by mouth 2 (two) times daily. For pain   CAMPHOR-EUCALYPTUS-MENTHOL (VICKS VAPORUB) 4.7-1.2-2.6 % OINT    Apply 1 application topically at bedtime as needed.    CLOPIDOGREL (PLAVIX) 75 MG TABLET    Take 75 mg by mouth daily. For stroke risk reduction   ESCITALOPRAM (LEXAPRO) 20 MG TABLET    Take 20 mg by mouth daily. For depression/ anxiety.   GLUCERNA (GLUCERNA) LIQD    Take 237 mLs by mouth. At breakfast and lunch   LEVOTHYROXINE (SYNTHROID, LEVOTHROID) 75 MCG TABLET    Take 75 mcg by mouth daily before breakfast. For thyroid therapy   LIVER OIL-ZINC OXIDE (DESITIN) 40 % OINTMENT    Apply 1 application topically as needed. Apply to rash on buttox/inner thighs after each BM for rash or irritation.   LORATADINE (CLARITIN) 10 MG TABLET    Take 10 mg by mouth daily.    LOSARTAN (COZAAR) 50 MG TABLET    Take 50 mg by mouth daily. For HTN   MELATONIN 5 MG TABS    Take 5 tablets by mouth at bedtime.   MENTHOL-ZINC OXIDE (CALMOSEPTINE) 0.44-20.625 % OINT    Apply 1 application topically as needed. Apply to peri-anal skin and inner buttox after each incontinent episode and PRN for irritation. Marland Kitchen   METOPROLOL TARTRATE (LOPRESSOR) 25 MG TABLET    Take 25 mg by mouth 2 (two) times daily.   MULTIPLE VITAMIN (MULTIVITAMIN) TABLET  Take 1 tablet by mouth daily.   MULTIPLE VITAMINS-MINERALS (PRESERVISION AREDS PO)    Take by mouth daily.   NON FORMULARY    Biotene Spray one spray before meals and at bedtime   OMEPRAZOLE (PRILOSEC) 20 MG CAPSULE    Take 20 mg by mouth daily. For GERD   OVER THE COUNTER MEDICATION    Take 1 spray by mouth 2 (two) times daily. Biotin spray   POLYETHYL GLYCOL-PROPYL GLYCOL (SYSTANE) 0.4-0.3 % SOLN    Apply 1 drop to eye 2 (two) times daily. One drop both eye every 2 hours as needed for  dryness or irritation   SODIUM CHLORIDE (OCEAN) 0.65 % NASAL SPRAY    Place 1 spray into the nose 2 (two) times daily.    TROLAMINE SALICYLATE (ASPERCREME) 10 % CREAM    Apply 1 application topically 2 (two) times daily as needed for muscle pain.   WITCH HAZEL LIQD    Apply 1 application topically at bedtime as needed.  Modified Medications   No medications on file  Discontinued Medications   No medications on file    Physical Exam: Filed Vitals:   01/24/15 1352  BP: 132/74  Pulse: 85  Temp: 98 F (36.7 C)  Resp: 20  Weight: 165 lb (74.844 kg)  SpO2: 97%  Physical Exam  Constitutional: She is oriented to person, place, and time. She appears well-developed and well-nourished. No distress.  HENT:  Head: Normocephalic and atraumatic.  Cardiovascular: Normal rate, regular rhythm, normal heart sounds and intact distal pulses.   Pulmonary/Chest: Effort normal and breath sounds normal.  Fine rales to both bases  Abdominal: Soft. Bowel sounds are normal. She exhibits no distension.  Musculoskeletal:  R hemiparesis  Neurological: She is alert and oriented to person, place, and time.  Oriented but forgetful of the details of her care  Skin: Skin is warm and dry. She is not diaphoretic.  Psychiatric: She has a normal mood and affect.   Wt Readings from Last 3 Encounters:  01/24/15 165 lb (74.844 kg)  11/19/14 163 lb (73.936 kg)  11/02/14 164 lb (74.39 kg)     Labs reviewed: Basic Metabolic Panel:  Recent Labs  04/01/14 08/10/14  NA 141 136*  K 4.0 4.3  BUN 15 16  CREATININE 0.5 0.7    Liver Function Tests: No results for input(s): AST, ALT, ALKPHOS, BILITOT, PROT, ALBUMIN in the last 8760 hours.  CBC:  Recent Labs  04/01/14 08/10/14  WBC 6.1 10.0  HGB 11.6* 11.6*  HCT 35* 35*  PLT 171 184    Lab Results  Component Value Date   TSH 0.47 03/09/2014   Lab Results  Component Value Date   HGBA1C 6.0 08/11/2013    Assessment/Plan  1. Controlled type 2  diabetes mellitus with complication, without long-term current use of insulin (HCC) -cbgs at goal, not on meds -check A1C  2. Other specified hypothyroidism -currently on synthroid 75 mcg  -check TSH  3. Essential hypertension -controlled -check CMP  4. Cerebrovascular disease -S/p CVA with residual right sided hemiparesis -remains on Plavix without s/e or new events -check CBC     Cindi Carbon, ANP [facility] [phone]

## 2015-01-25 DIAGNOSIS — I63319 Cerebral infarction due to thrombosis of unspecified middle cerebral artery: Secondary | ICD-10-CM | POA: Diagnosis not present

## 2015-01-25 DIAGNOSIS — D464 Refractory anemia, unspecified: Secondary | ICD-10-CM | POA: Diagnosis not present

## 2015-01-25 DIAGNOSIS — E118 Type 2 diabetes mellitus with unspecified complications: Secondary | ICD-10-CM | POA: Diagnosis not present

## 2015-01-25 DIAGNOSIS — E039 Hypothyroidism, unspecified: Secondary | ICD-10-CM | POA: Diagnosis not present

## 2015-01-25 LAB — BASIC METABOLIC PANEL
BUN: 14 mg/dL (ref 4–21)
CREATININE: 0.5 mg/dL (ref 0.5–1.1)
GLUCOSE: 90 mg/dL
POTASSIUM: 4.3 mmol/L (ref 3.4–5.3)
Sodium: 138 mmol/L (ref 137–147)

## 2015-01-25 LAB — HEPATIC FUNCTION PANEL
ALT: 10 U/L (ref 7–35)
AST: 18 U/L (ref 13–35)
Alkaline Phosphatase: 33 U/L (ref 25–125)
Bilirubin, Total: 0.4 mg/dL

## 2015-01-25 LAB — TSH: TSH: 0.76 u[IU]/mL (ref 0.41–5.90)

## 2015-01-25 LAB — HEMOGLOBIN A1C: Hemoglobin A1C: 5.6

## 2015-02-01 ENCOUNTER — Ambulatory Visit (INDEPENDENT_AMBULATORY_CARE_PROVIDER_SITE_OTHER): Payer: Medicare Other | Admitting: *Deleted

## 2015-02-01 DIAGNOSIS — I442 Atrioventricular block, complete: Secondary | ICD-10-CM | POA: Diagnosis not present

## 2015-02-04 NOTE — Progress Notes (Signed)
Remote pacemaker transmission.   

## 2015-02-10 LAB — CUP PACEART REMOTE DEVICE CHECK
Battery Impedance: 367 Ohm
Brady Statistic AP VP Percent: 0 %
Brady Statistic AP VS Percent: 0 %
Brady Statistic AS VP Percent: 100 %
Brady Statistic AS VS Percent: 0 %
Date Time Interrogation Session: 20161129160947
Implantable Lead Location: 753860
Implantable Lead Model: 5076
Implantable Lead Model: 5076
Lead Channel Setting Pacing Amplitude: 2.5 V
Lead Channel Setting Pacing Pulse Width: 0.4 ms
Lead Channel Setting Sensing Sensitivity: 2.8 mV
MDC IDC LEAD IMPLANT DT: 20110506
MDC IDC LEAD IMPLANT DT: 20110506
MDC IDC LEAD LOCATION: 753859
MDC IDC MSMT BATTERY REMAINING LONGEVITY: 91 mo
MDC IDC MSMT BATTERY VOLTAGE: 2.79 V
MDC IDC MSMT LEADCHNL RA IMPEDANCE VALUE: 481 Ohm
MDC IDC MSMT LEADCHNL RV IMPEDANCE VALUE: 484 Ohm
MDC IDC SET LEADCHNL RA PACING AMPLITUDE: 2 V

## 2015-02-11 ENCOUNTER — Encounter: Payer: Self-pay | Admitting: Cardiology

## 2015-02-15 NOTE — Progress Notes (Signed)
This encounter was created in error - please disregard.

## 2015-02-22 DIAGNOSIS — L84 Corns and callosities: Secondary | ICD-10-CM | POA: Diagnosis not present

## 2015-02-22 DIAGNOSIS — E1159 Type 2 diabetes mellitus with other circulatory complications: Secondary | ICD-10-CM | POA: Diagnosis not present

## 2015-02-22 DIAGNOSIS — L602 Onychogryphosis: Secondary | ICD-10-CM | POA: Diagnosis not present

## 2015-03-04 ENCOUNTER — Non-Acute Institutional Stay (SKILLED_NURSING_FACILITY): Payer: Medicare Other | Admitting: Adult Health

## 2015-03-04 ENCOUNTER — Encounter: Payer: Self-pay | Admitting: Adult Health

## 2015-03-04 DIAGNOSIS — I10 Essential (primary) hypertension: Secondary | ICD-10-CM

## 2015-03-04 DIAGNOSIS — I679 Cerebrovascular disease, unspecified: Secondary | ICD-10-CM

## 2015-03-04 DIAGNOSIS — E039 Hypothyroidism, unspecified: Secondary | ICD-10-CM | POA: Diagnosis not present

## 2015-03-04 DIAGNOSIS — E118 Type 2 diabetes mellitus with unspecified complications: Secondary | ICD-10-CM

## 2015-03-04 DIAGNOSIS — J029 Acute pharyngitis, unspecified: Secondary | ICD-10-CM

## 2015-03-04 NOTE — Progress Notes (Signed)
Patient ID: Rachel Cooke, female   DOB: [birth year], 81 y.o.   MRN: [id number]   [facility]  Code Status:  DNR Advanced Directive information    Patient Care Team: [name], DO as PCP - General (Geriatric Medicine) [facility] Clarene Essex, MD as Attending Physician (Gastroenterology) Garvin Fila, MD as Consulting Physician (Neurology)  Chief Complaint  Patient presents with  . Medical Management of Chronic Issues    HPI:  81 yo female long term care resident seen for medical mgt of her chronic diseases.  She has a h/o hemorrhoids, right hammer toe, DMII with circulatory complications, complete AV block s/p PPM, right hemiplegia, amnesia, primary OA, esophageal dyskinesia, NSTEMI,  dysphagia, htn, hyperlipidemia, major depressive disorder, and breast cancer.   She has no complaints today. Her weight has remained has decreased by 4lbs, currently at 161. She had a significant resp infection last month that may be the cause of the weight loss.   Has a hx of allergic rhinitis and uses claritin. Denies any itchy, watery eyes, nose etc. She continues on pureed baby food consistency for her diet due to a hx of dysmotility and food impaction.  She has a hx of DM2, not on meds, A1C is 5.6%.   Functional status: She is non ambulatory due to her CVA and is using a stand up lift to the scooter or chair.   Review of Systems:  Review of Systems  Constitutional: Negative for fever and chills.  HENT: Negative for congestion.   Respiratory: Negative for cough, sputum production and shortness of breath.   Cardiovascular: Positive for leg swelling. Negative for chest pain and palpitations.  Gastrointestinal: Negative for abdominal pain, diarrhea, constipation and blood in stool.  Genitourinary: Negative for dysuria.  Musculoskeletal: Negative for joint pain.  Skin: Negative for rash.  Neurological: Positive for focal weakness.  Negative for dizziness.  Psychiatric/Behavioral: Positive for memory loss. Negative for depression.    Medications: Patient's Medications  New Prescriptions   No medications on file  Previous Medications   ACETAMINOPHEN (TYLENOL) 650 MG CR TABLET    Take 650 mg by mouth 2 (two) times daily. For pain   CAMPHOR-EUCALYPTUS-MENTHOL (VICKS VAPORUB) 4.7-1.2-2.6 % OINT    Apply 1 application topically at bedtime as needed.    CARBOXYMETHYLCELLULOSE (REFRESH PLUS) 0.5 % SOLN    1 drop 3 (three) times daily.   CLOPIDOGREL (PLAVIX) 75 MG TABLET    Take 75 mg by mouth daily. For stroke risk reduction   ESCITALOPRAM (LEXAPRO) 20 MG TABLET    Take 20 mg by mouth daily. For depression/ anxiety.   GLUCERNA (GLUCERNA) LIQD    Take 237 mLs by mouth. At breakfast and lunch   LEVOTHYROXINE (SYNTHROID, LEVOTHROID) 75 MCG TABLET    Take 75 mcg by mouth daily before breakfast. For thyroid therapy   LIVER OIL-ZINC OXIDE (DESITIN) 40 % OINTMENT    Apply 1 application topically as needed. Apply to rash on buttox/inner thighs after each BM for rash or irritation.   LORATADINE (CLARITIN) 10 MG TABLET    Take 10 mg by mouth daily.    LOSARTAN (COZAAR) 50 MG TABLET    Take 50 mg by mouth daily. For HTN   MELATONIN 5 MG TABS    Take 5 tablets by mouth at bedtime.   MENTHOL-ZINC OXIDE (CALMOSEPTINE) 0.44-20.625 % OINT    Apply 1 application topically as needed. Apply to peri-anal skin and inner buttox after  each incontinent episode and PRN for irritation. Marland Kitchen   METOPROLOL TARTRATE (LOPRESSOR) 25 MG TABLET    Take 25 mg by mouth 2 (two) times daily.   MULTIPLE VITAMIN (MULTIVITAMIN) TABLET    Take 1 tablet by mouth daily.   MULTIPLE VITAMINS-MINERALS (PRESERVISION AREDS PO)    Take by mouth daily.   OMEPRAZOLE (PRILOSEC) 20 MG CAPSULE    Take 20 mg by mouth daily. For GERD   OVER THE COUNTER MEDICATION    Take 1 spray by mouth 2 (two) times daily. Biotin spray   POLYETHYL GLYCOL-PROPYL GLYCOL (SYSTANE) 0.4-0.3 % SOLN     Apply 1 drop to eye 2 (two) times daily. One drop both eye every 2 hours as needed for dryness or irritation   TROLAMINE SALICYLATE (ASPERCREME) 10 % CREAM    Apply 1 application topically 2 (two) times daily as needed for muscle pain.   WITCH HAZEL LIQD    Apply 1 application topically at bedtime as needed.  Modified Medications   No medications on file  Discontinued Medications   No medications on file    Physical Exam: Filed Vitals:   03/04/15 1200  BP: 136/74  Pulse: 86  Temp: 97.5 F (36.4 C)  Resp: 17  Weight: 161 lb (73.029 kg)  SpO2: 95%  Physical Exam  Constitutional: She is oriented to person, place, and time. She appears well-developed and well-nourished. No distress.  HENT:  Head: Normocephalic and atraumatic.  Cardiovascular: Normal rate, regular rhythm, normal heart sounds and intact distal pulses.   Pulmonary/Chest: Effort normal and breath sounds normal.  Fine rales to both bases  Abdominal: Soft. Bowel sounds are normal. She exhibits no distension.  Musculoskeletal:  R hemiparesis  Neurological: She is alert and oriented to person, place, and time.  Oriented but forgetful of the details of her care  Skin: Skin is warm and dry. She is not diaphoretic.  Psychiatric: She has a normal mood and affect.   Wt Readings from Last 3 Encounters:  03/04/15 161 lb (73.029 kg)  01/24/15 165 lb (74.844 kg)  11/19/14 163 lb (73.936 kg)     Labs reviewed: Basic Metabolic Panel:  Recent Labs  04/01/14 08/10/14 01/25/15  NA 141 136* 138  K 4.0 4.3 4.3  BUN 15 16 14   CREATININE 0.5 0.7 0.5    Liver Function Tests:  Recent Labs  01/25/15  AST 18  ALT 10  ALKPHOS 33    CBC:  Recent Labs  04/01/14 08/10/14  WBC 6.1 10.0  HGB 11.6* 11.6*  HCT 35* 35*  PLT 171 184    Lab Results  Component Value Date   TSH 0.76 01/25/2015   Lab Results  Component Value Date   HGBA1C 5.6 01/25/2015    Assessment/Plan  1. Controlled type 2 diabetes mellitus with  complication, without long-term current use of insulin (HCC) -A1C at goal, not on meds -continue to monitor  2. Other specified hypothyroidism -currently on synthroid 75 mcg  -continue current dose of synthroid  3. Essential hypertension -controlled, continue cozaar and lopressor -renal function normal  4. Cerebrovascular disease -S/p CVA with residual right sided hemiparesis -remains on Plavix without s/e or new events -cbc no change  5) Allergic pharyngitis -no symptoms, continue claritin     Cindi Carbon, [place] [phone]

## 2015-03-10 DIAGNOSIS — I69951 Hemiplegia and hemiparesis following unspecified cerebrovascular disease affecting right dominant side: Secondary | ICD-10-CM | POA: Diagnosis not present

## 2015-03-10 DIAGNOSIS — M6281 Muscle weakness (generalized): Secondary | ICD-10-CM | POA: Diagnosis not present

## 2015-03-10 DIAGNOSIS — F339 Major depressive disorder, recurrent, unspecified: Secondary | ICD-10-CM | POA: Diagnosis not present

## 2015-03-10 DIAGNOSIS — R2689 Other abnormalities of gait and mobility: Secondary | ICD-10-CM | POA: Diagnosis not present

## 2015-03-11 DIAGNOSIS — I69951 Hemiplegia and hemiparesis following unspecified cerebrovascular disease affecting right dominant side: Secondary | ICD-10-CM | POA: Diagnosis not present

## 2015-03-11 DIAGNOSIS — M6281 Muscle weakness (generalized): Secondary | ICD-10-CM | POA: Diagnosis not present

## 2015-03-11 DIAGNOSIS — F339 Major depressive disorder, recurrent, unspecified: Secondary | ICD-10-CM | POA: Diagnosis not present

## 2015-03-11 DIAGNOSIS — R2689 Other abnormalities of gait and mobility: Secondary | ICD-10-CM | POA: Diagnosis not present

## 2015-03-14 DIAGNOSIS — I69951 Hemiplegia and hemiparesis following unspecified cerebrovascular disease affecting right dominant side: Secondary | ICD-10-CM | POA: Diagnosis not present

## 2015-03-14 DIAGNOSIS — M6281 Muscle weakness (generalized): Secondary | ICD-10-CM | POA: Diagnosis not present

## 2015-03-14 DIAGNOSIS — F339 Major depressive disorder, recurrent, unspecified: Secondary | ICD-10-CM | POA: Diagnosis not present

## 2015-03-14 DIAGNOSIS — R2689 Other abnormalities of gait and mobility: Secondary | ICD-10-CM | POA: Diagnosis not present

## 2015-03-16 DIAGNOSIS — I69951 Hemiplegia and hemiparesis following unspecified cerebrovascular disease affecting right dominant side: Secondary | ICD-10-CM | POA: Diagnosis not present

## 2015-03-16 DIAGNOSIS — R2689 Other abnormalities of gait and mobility: Secondary | ICD-10-CM | POA: Diagnosis not present

## 2015-03-16 DIAGNOSIS — F339 Major depressive disorder, recurrent, unspecified: Secondary | ICD-10-CM | POA: Diagnosis not present

## 2015-03-16 DIAGNOSIS — M6281 Muscle weakness (generalized): Secondary | ICD-10-CM | POA: Diagnosis not present

## 2015-03-18 DIAGNOSIS — M6281 Muscle weakness (generalized): Secondary | ICD-10-CM | POA: Diagnosis not present

## 2015-03-18 DIAGNOSIS — F339 Major depressive disorder, recurrent, unspecified: Secondary | ICD-10-CM | POA: Diagnosis not present

## 2015-03-18 DIAGNOSIS — R2689 Other abnormalities of gait and mobility: Secondary | ICD-10-CM | POA: Diagnosis not present

## 2015-03-18 DIAGNOSIS — I69951 Hemiplegia and hemiparesis following unspecified cerebrovascular disease affecting right dominant side: Secondary | ICD-10-CM | POA: Diagnosis not present

## 2015-03-25 DIAGNOSIS — F339 Major depressive disorder, recurrent, unspecified: Secondary | ICD-10-CM | POA: Diagnosis not present

## 2015-03-25 DIAGNOSIS — M6281 Muscle weakness (generalized): Secondary | ICD-10-CM | POA: Diagnosis not present

## 2015-03-25 DIAGNOSIS — R2689 Other abnormalities of gait and mobility: Secondary | ICD-10-CM | POA: Diagnosis not present

## 2015-03-25 DIAGNOSIS — I69951 Hemiplegia and hemiparesis following unspecified cerebrovascular disease affecting right dominant side: Secondary | ICD-10-CM | POA: Diagnosis not present

## 2015-03-31 ENCOUNTER — Non-Acute Institutional Stay (SKILLED_NURSING_FACILITY): Payer: Medicare Other | Admitting: Adult Health

## 2015-03-31 ENCOUNTER — Encounter: Payer: Self-pay | Admitting: Adult Health

## 2015-03-31 DIAGNOSIS — K625 Hemorrhage of anus and rectum: Secondary | ICD-10-CM

## 2015-03-31 DIAGNOSIS — I679 Cerebrovascular disease, unspecified: Secondary | ICD-10-CM

## 2015-03-31 DIAGNOSIS — I10 Essential (primary) hypertension: Secondary | ICD-10-CM | POA: Diagnosis not present

## 2015-03-31 NOTE — Progress Notes (Signed)
Patient ID: Rachel Cooke, female   DOB: 10/03/1919, 82 y.o.   MRN: [id number]   [facility]  Code Status:  DNR Advanced Directive information    Patient Care Team: Rachel Curry, DO as PCP - General (Geriatric Medicine) [facility] Rachel Essex, MD as Attending Physician (Gastroenterology) Rachel Fila, MD as Consulting Physician (Neurology)  Chief Complaint  Patient presents with  . Acute Visit    hemorrhoids    HPI:  82 yo female long term care resident seen for medical mgt of her chronic diseases.  She has a h/o hemorrhoids, right hammer toe, DMII with circulatory complications, complete AV block s/p PPM, right hemiplegia, amnesia, primary OA, esophageal dyskinesia, NSTEMI,  dysphagia, htn, hyperlipidemia, major depressive disorder, and breast cancer.   Staff repots moderate amt of blood in the toilet 3 x this week. Resident VS are stable, denies abd pain, or rectal pain. Has been treated for hemorrhoids in the past with witch hazel. Has regular, soft BMs due to pureed food diet. Resident has a hx of CVA and is on plavix for risk reduction. Her weight has remained stable at 161 lbs. She continues on pureed baby food consistency for her diet due to a hx of dysmotility and food impaction.  She has a hx of DM2, not on meds, A1C is 5.6%.   Functional status: She is non ambulatory due to her CVA and is using a stand up lift to the scooter or chair.   Review of Systems:  Review of Systems  Constitutional: Negative for fever and chills.  HENT: Negative for congestion.   Respiratory: Negative for cough, sputum production and shortness of breath.   Cardiovascular: Positive for leg swelling. Negative for chest pain and palpitations.  Gastrointestinal: Positive for blood in stool. Negative for heartburn, nausea, vomiting, abdominal pain, diarrhea and constipation.  Genitourinary: Negative for dysuria.  Musculoskeletal: Negative  for joint pain.  Skin: Negative for rash.  Neurological: Positive for focal weakness. Negative for dizziness.  Psychiatric/Behavioral: Positive for memory loss. Negative for depression.    Medications: Patient's Medications  New Prescriptions   No medications on file  Previous Medications   ACETAMINOPHEN (TYLENOL) 650 MG CR TABLET    Take 650 mg by mouth 2 (two) times daily. For pain   CARBOXYMETHYLCELLULOSE (REFRESH PLUS) 0.5 % SOLN    1 drop 3 (three) times daily.   CLOPIDOGREL (PLAVIX) 75 MG TABLET    Take 75 mg by mouth daily. For stroke risk reduction   DENTIFRICES (BIOTENE DRY MOUTH CARE DT)    Place 1 spray onto teeth 4 (four) times daily.   ESCITALOPRAM (LEXAPRO) 20 MG TABLET    Take 20 mg by mouth daily. For depression/ anxiety.   GLUCERNA (GLUCERNA) LIQD    Take 237 mLs by mouth. At breakfast and lunch   LEVOTHYROXINE (SYNTHROID, LEVOTHROID) 75 MCG TABLET    Take 75 mcg by mouth daily before breakfast. For thyroid therapy   LIVER OIL-ZINC OXIDE (DESITIN) 40 % OINTMENT    Apply 1 application topically as needed. Apply to rash on buttox/inner thighs after each BM for rash or irritation.   LORATADINE (CLARITIN) 10 MG TABLET    Take 10 mg by mouth daily.    LOSARTAN (COZAAR) 50 MG TABLET    Take 50 mg by mouth daily. For HTN   MELATONIN 5 MG TABS    Take 5 tablets by mouth at bedtime.   METOPROLOL TARTRATE (LOPRESSOR) 25  MG TABLET    Take 25 mg by mouth 2 (two) times daily.   MULTIPLE VITAMIN (MULTIVITAMIN) TABLET    Take 1 tablet by mouth daily.   MULTIPLE VITAMINS-MINERALS (PRESERVISION AREDS PO)    Take by mouth daily.   OMEPRAZOLE (PRILOSEC) 20 MG CAPSULE    Take 20 mg by mouth daily. For GERD   OVER THE COUNTER MEDICATION    Take 1 spray by mouth 2 (two) times daily. Biotin spray   POLYETHYL GLYCOL-PROPYL GLYCOL (SYSTANE) 0.4-0.3 % SOLN    Apply 1 drop to eye 2 (two) times daily. One drop both eye every 2 hours as needed for dryness or irritation   TROLAMINE SALICYLATE  (ASPERCREME) 10 % CREAM    Apply 1 application topically 2 (two) times daily as needed for muscle pain.   WITCH HAZEL LIQD    Apply 1 application topically at bedtime as needed.  Modified Medications   No medications on file  Discontinued Medications   CAMPHOR-EUCALYPTUS-MENTHOL (VICKS VAPORUB) 4.7-1.2-2.6 % OINT    Apply 1 application topically at bedtime as needed.    MENTHOL-ZINC OXIDE (CALMOSEPTINE) 0.44-20.625 % OINT    Apply 1 application topically as needed. Apply to peri-anal skin and inner buttox after each incontinent episode and PRN for irritation. Marland Kitchen    Physical Exam: VS reviewed  Physical Exam  Constitutional: She is oriented to person, place, and time. She appears well-developed and well-nourished. No distress.  HENT:  Head: Normocephalic and atraumatic.  Cardiovascular: Normal rate, regular rhythm, normal heart sounds and intact distal pulses.   Pulmonary/Chest: Effort normal and breath sounds normal.  Abdominal: Soft. Bowel sounds are normal. She exhibits no distension. There is no tenderness. No hernia.  Genitourinary: Rectal exam shows external hemorrhoid ( no swelling or erythema). Rectal exam shows no internal hemorrhoid, no fissure, no mass, no tenderness and anal tone normal.  Musculoskeletal:  R hemiparesis  Neurological: She is alert and oriented to person, place, and time.  Oriented but forgetful of the details of her care  Skin: Skin is warm and dry. She is not diaphoretic.  Psychiatric: She has a normal mood and affect.   Wt Readings from Last 3 Encounters:  03/04/15 161 lb (73.029 kg)  01/24/15 165 lb (74.844 kg)  11/19/14 163 lb (73.936 kg)     Labs reviewed: Basic Metabolic Panel:  Recent Labs  04/01/14 08/10/14 01/25/15  NA 141 136* 138  K 4.0 4.3 4.3  BUN 15 16 14   CREATININE 0.5 0.7 0.5    Liver Function Tests:  Recent Labs  01/25/15  AST 18  ALT 10  ALKPHOS 33    CBC:  Recent Labs  04/01/14 08/10/14  WBC 6.1 10.0  HGB 11.6*  11.6*  HCT 35* 35*  PLT 171 184    Lab Results  Component Value Date   TSH 0.76 01/25/2015   Lab Results  Component Value Date   HGBA1C 5.6 01/25/2015    Assessment/Plan  1. Rectal bleeding -no blood noted on exam -external hemorrhoids did not appear inflamed -concern for GIB, although resident is stable at this time -hydrocortisone supp qhs for 7 days -CBC in the am -heme stool x3 -if positive will need to speak with family about discontinuing Plavix, would favor this given her frailty and current state of health  2. Cerebrovascular disease -s/p CVA with R sided weakness -see #1  3. Essential hypertension -controlled, continue metoprolol    Cindi Carbon, ANP [facility] [phone]

## 2015-04-01 DIAGNOSIS — K649 Unspecified hemorrhoids: Secondary | ICD-10-CM | POA: Diagnosis not present

## 2015-04-01 DIAGNOSIS — D464 Refractory anemia, unspecified: Secondary | ICD-10-CM | POA: Diagnosis not present

## 2015-04-01 LAB — CBC AND DIFFERENTIAL
HCT: 35 % — AB (ref 36–46)
Hemoglobin: 11.3 g/dL — AB (ref 12.0–16.0)
Platelets: 198 10*3/uL (ref 150–399)
WBC: 8.1 10^3/mL

## 2015-04-05 ENCOUNTER — Telehealth: Payer: Self-pay | Admitting: *Deleted

## 2015-04-05 NOTE — Telephone Encounter (Signed)
Patient daughter, Heywood Iles called and wants to speak with Dr. Mariea Clonts regarding patient. Stated that she got a call yesterday from Tucker, NP regarding patient's rectum bleeding, but daughter wants to talk with Dr. Mariea Clonts regarding this and stopping some medications. Please Call # [phone] (cell) or # [phone] (home)

## 2015-04-07 DIAGNOSIS — H43812 Vitreous degeneration, left eye: Secondary | ICD-10-CM | POA: Diagnosis not present

## 2015-04-07 DIAGNOSIS — H353112 Nonexudative age-related macular degeneration, right eye, intermediate dry stage: Secondary | ICD-10-CM | POA: Diagnosis not present

## 2015-04-07 DIAGNOSIS — H353122 Nonexudative age-related macular degeneration, left eye, intermediate dry stage: Secondary | ICD-10-CM | POA: Diagnosis not present

## 2015-04-12 NOTE — Telephone Encounter (Signed)
Patient daughter, Heywood Iles called again today upset that I haven't given you the message to call her. She wants to speak with you regarding her mother's medication and wants to speak with you directly. She has also called [facility] and they told her that she had to go through [facility]. Please Call daughter # [phone]

## 2015-04-13 NOTE — Telephone Encounter (Signed)
The message I got yesterday said she would speak to me or Alyse Low and Alyse Low was initially talking with her about the plavix issue and her recurrent bleeding so I thought it would make sense for her to finish the discussion.

## 2015-04-13 NOTE — Telephone Encounter (Signed)
Patient daughter, [name] would like to speak with you [phone]

## 2015-04-19 NOTE — Telephone Encounter (Signed)
Patient daughter, Heywood Iles, called back today and does not understand why she cannot get a call from Dr. Mariea Clonts. Stated that it has been well over 2 weeks now and still no call. Daughter stated that she has had foot surgery and cannot leave her house, so she is just trying to take care of her mother from home.  Wants to speak with Dr. Mariea Clonts about patient's mediations and wants a return call from her. Daughter stated that She has spoken to the head nurse at [facility] and also gave her a message for Dr. Mariea Clonts. Daughter stated that [facility] told her that Dr. Mariea Clonts has gone home with Migraine, but she wants a call by tomorrow. [phone] Allen Norris has called and left message with Dr. Mariea Clonts the importance of a return call back to daughter.

## 2015-04-20 NOTE — Telephone Encounter (Signed)
Spoke with [place].  She did speak with Tye Maryland, unit supervisor here at [place], last evening and reported that they decided to stop plavix (she and her siblings). She told me again about her mother's family history of colon cancer in her father and sister.  I note her weights are stable, she's had no abdominal pain.  She did previously have 3 positive hemoccults and Christy's exam went against the hemorrhoids as a source.  [name] and Mrs. Franzel's other children all agree to just monitor her for now for any further abdominal symptoms and consider CT abdomen/pelvis if she develops other symptoms.  They would not want her to go through surgery or chemotherapy if they knew she had colon cancer anyway.  I also reiterated that all phone calls about skilled nursing residents should go through the unit supervisors rather than the office b/c staff in the office are not aware of what is going on at the SNF, but nursing staff at [facility] are.  She understands this for future reference and also said we can reach out to her via her call phone at any time we need to speak with her about her mother.

## 2015-04-26 IMAGING — CT CT HEAD W/O CM
1 of 2 series · 13 of 30 positions shown, 17 images · non-contrast
Comparison: 06/28/2012

CLINICAL DATA: Fall.  Right-sided weakness.  Unresponsive patient.

CT HEAD WITHOUT CONTRAST
TECHNIQUE: Contiguous axial images were obtained from the base of
the skull through the vertex without contrast.

[Series 2: brain · axial · 0.47mm/px · z∈[+105,+243]mm · 13 of 32 slices shown, 17 images]
[im 3/32  brain]
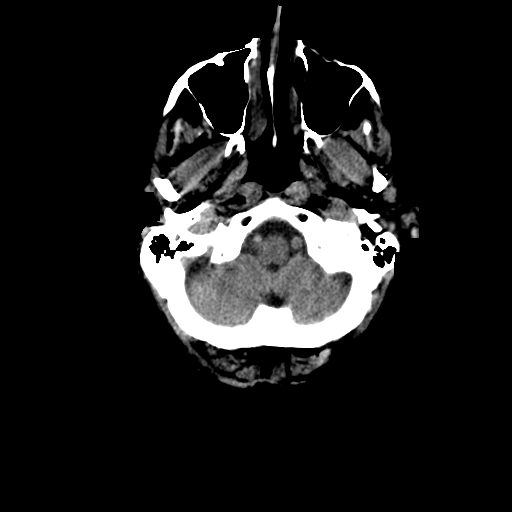
[im 3/32  bone]
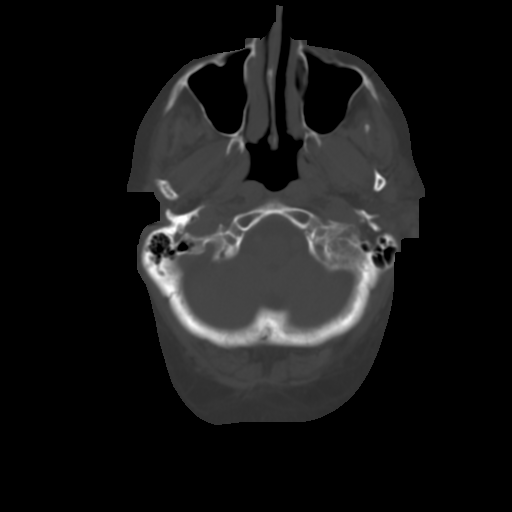
[im 5/32  brain]
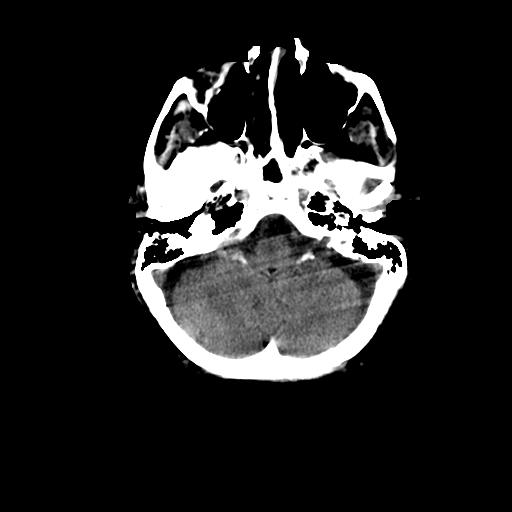
[im 7/32  brain]
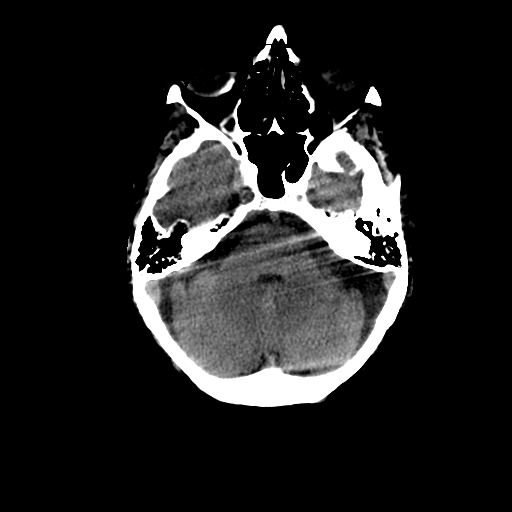
[im 9/32  brain]
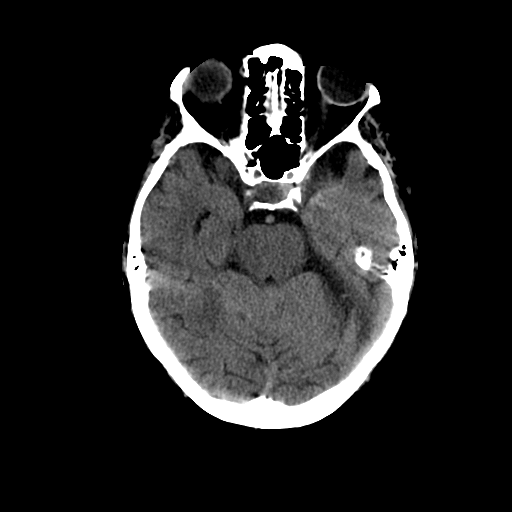
[im 12/32  brain]
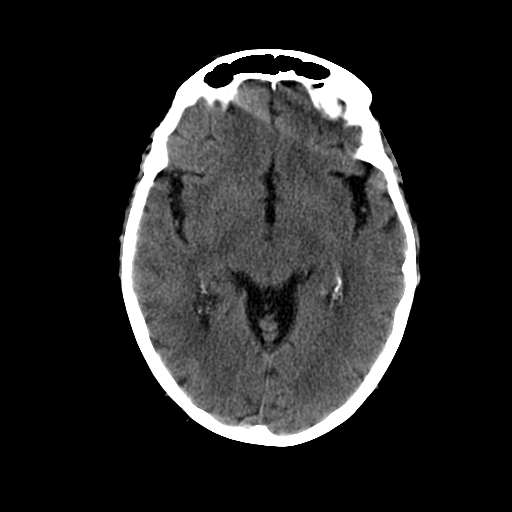
[im 12/32  bone]
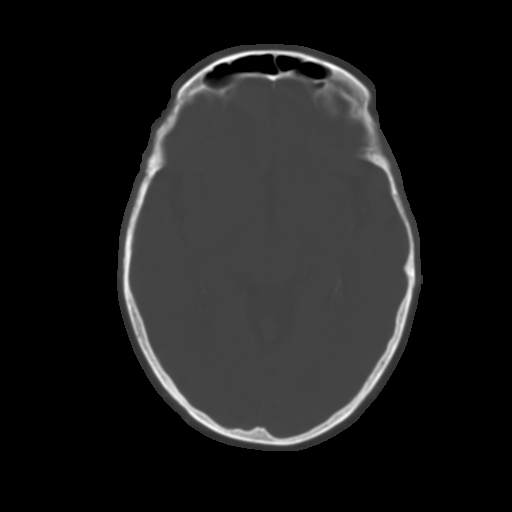
[im 14/32  brain]
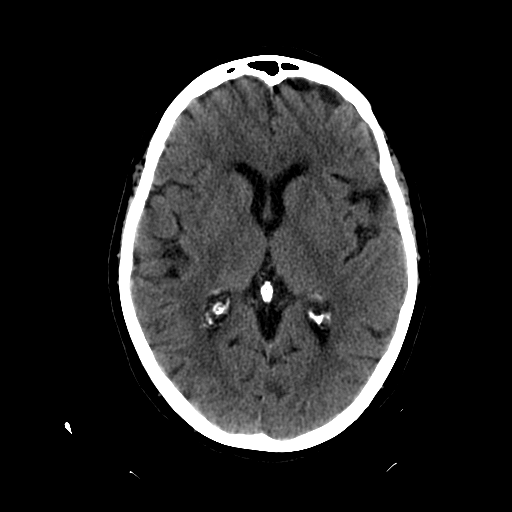
[im 16/32  brain]
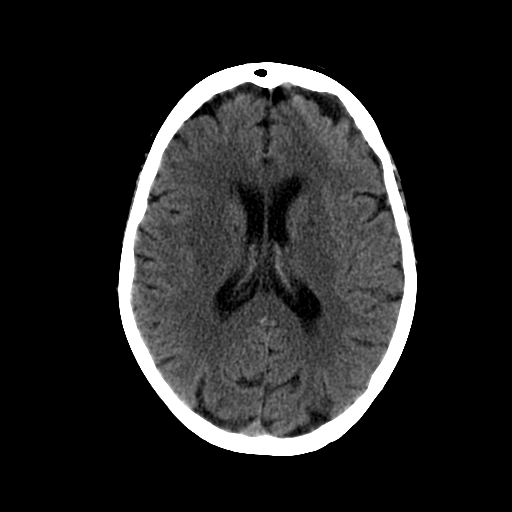
[im 18/32  brain]
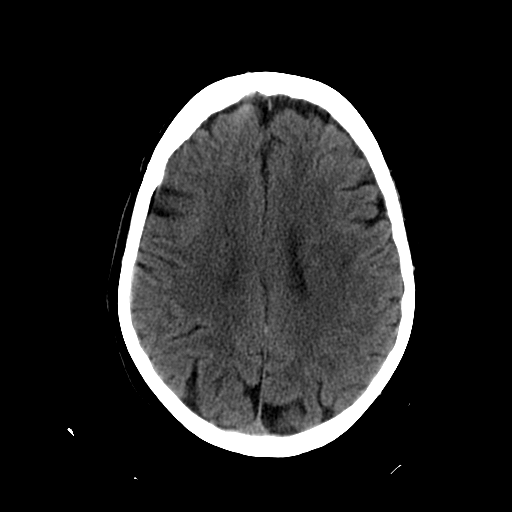
[im 20/32  brain]
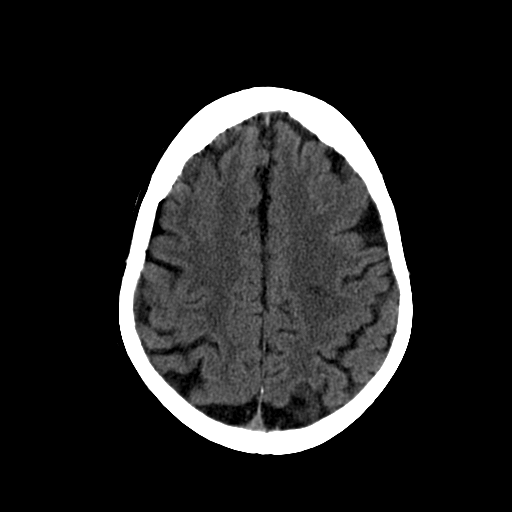
[im 20/32  bone]
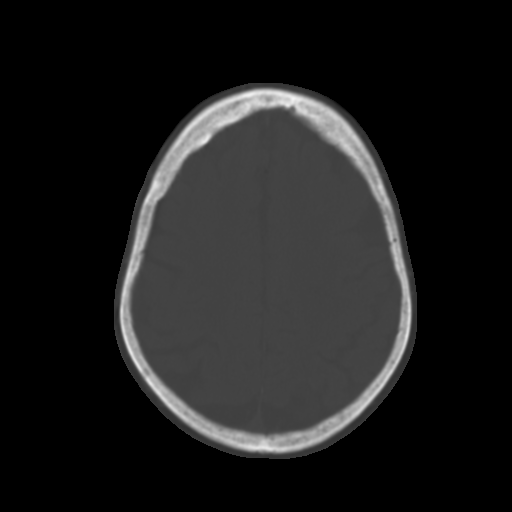
[im 23/32  brain]
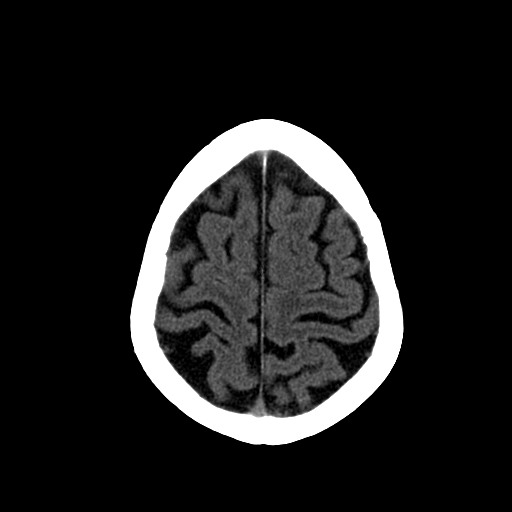
[im 25/32  brain]
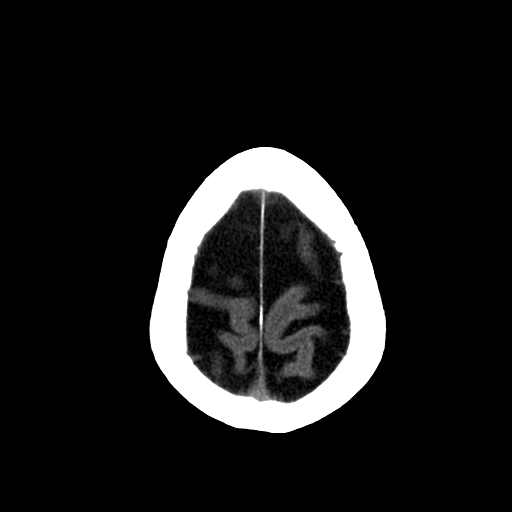
[im 27/32  brain]
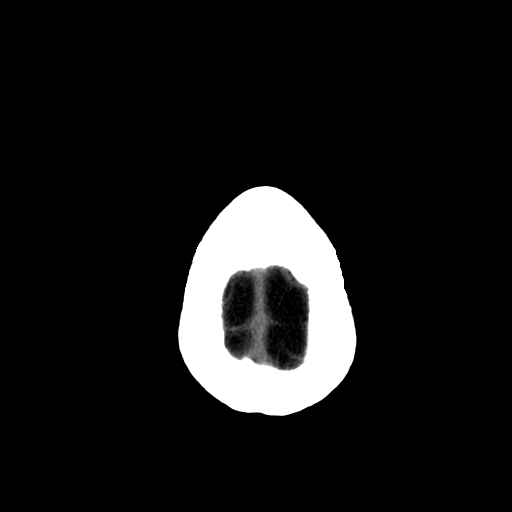
[im 29/32  brain]
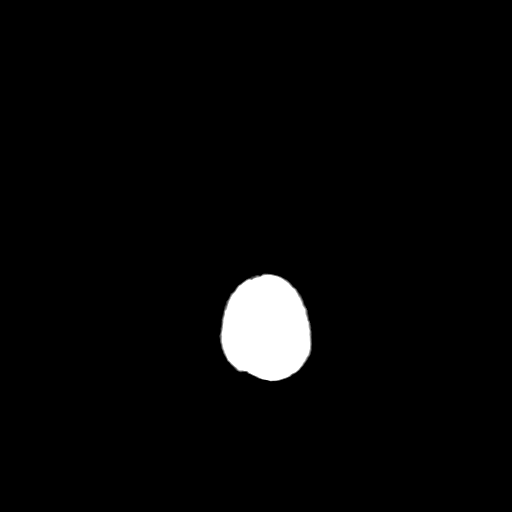
[im 29/32  bone]
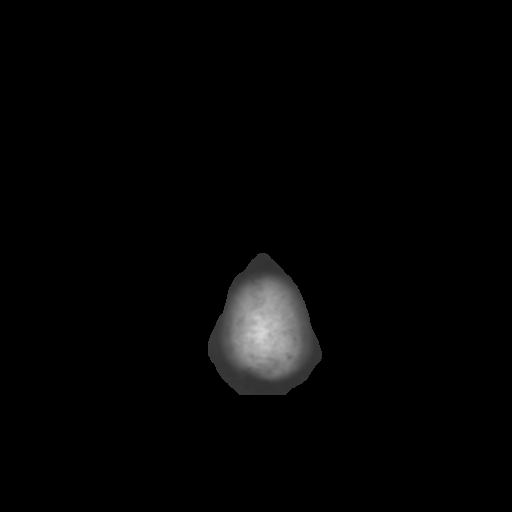

[13 of 30 positions shown; findings below may reference images not displayed]

FINDINGS: The brain stem, cerebellum, cerebral peduncles, thalami,
basal ganglia, basilar cisterns, and ventricular system appear
unremarkable.

Subtly increased hypodensity in the left parietal corona radiata
noted.  No significant loss of gray-white differentiation or other
discrete indicators of stroke.  No intracranial hemorrhage or
obvious mass lesion.  No midline shift.

Atherosclerotic calcification of the carotid siphons noted.
IMPRESSION: 1.  Subtle hypodensity in the left parietal corona radiata/centrum
semiovale appears more conspicuous than on yesterday's exam.  This
could be an indicator of a small white matter infarct, inflammatory
focus, or injury.  No hemorrhage identified.  MRI may help for
further characterization if clinically warranted.

## 2015-05-03 ENCOUNTER — Ambulatory Visit (INDEPENDENT_AMBULATORY_CARE_PROVIDER_SITE_OTHER): Payer: Medicare Other | Admitting: *Deleted

## 2015-05-03 ENCOUNTER — Telehealth: Payer: Self-pay | Admitting: Cardiology

## 2015-05-03 DIAGNOSIS — I442 Atrioventricular block, complete: Secondary | ICD-10-CM | POA: Diagnosis not present

## 2015-05-03 NOTE — Progress Notes (Signed)
Remote pacemaker transmission.   

## 2015-05-03 NOTE — Telephone Encounter (Signed)
Confirmed remote transmission w/ pt husband.   

## 2015-05-09 ENCOUNTER — Encounter: Payer: Self-pay | Admitting: Adult Health

## 2015-05-09 ENCOUNTER — Non-Acute Institutional Stay (SKILLED_NURSING_FACILITY): Payer: Medicare Other | Admitting: Adult Health

## 2015-05-09 DIAGNOSIS — E118 Type 2 diabetes mellitus with unspecified complications: Secondary | ICD-10-CM

## 2015-05-09 DIAGNOSIS — D500 Iron deficiency anemia secondary to blood loss (chronic): Secondary | ICD-10-CM | POA: Insufficient documentation

## 2015-05-09 DIAGNOSIS — I679 Cerebrovascular disease, unspecified: Secondary | ICD-10-CM | POA: Diagnosis not present

## 2015-05-09 DIAGNOSIS — I10 Essential (primary) hypertension: Secondary | ICD-10-CM

## 2015-05-09 DIAGNOSIS — I69351 Hemiplegia and hemiparesis following cerebral infarction affecting right dominant side: Secondary | ICD-10-CM | POA: Diagnosis not present

## 2015-05-09 DIAGNOSIS — R195 Other fecal abnormalities: Secondary | ICD-10-CM | POA: Diagnosis not present

## 2015-05-09 NOTE — Progress Notes (Signed)
Patient ID: Rachel Cooke, female   DOB: 07-09-1919, 82 y.o.   MRN: [id number]   [facility]  Code Status:  DNR Advanced Directive information    Patient Care Team: [name], DO as PCP - General (Geriatric Medicine) [facility] Clarene Essex, MD as Attending Physician (Gastroenterology) Garvin Fila, MD as Consulting Physician (Neurology)  Chief Complaint  Patient presents with  . Medical Management of Chronic Issues    HPI:  82 yo female long term care resident seen for medical mgt of her chronic diseases.  She has a h/o hemorrhoids, right hammer toe, DMII with circulatory complications, complete AV block s/p PPM, right hemiplegia, amnesia, primary OA, esophageal dyskinesia, NSTEMI,  dysphagia, htn, hyperlipidemia, major depressive disorder, and breast cancer.    1. Heme positive stool Plavix was d/c'ed on 2/14 due to 3 pos stools. Staff reports that pt has not had any blood in stool. Pt states she has had some constipation, but it has since resolved.   2. Cerebrovascular disease Off plavix, BP well controlled in review of the records, not currently on statin due to age/debility  3. Hemiparesis affecting right side as late effect of cerebrovascular accident ([place]) Pt has R sided hemiparesis. She uses an electronic wheelchair and stand up lift with assistance.   4. Controlled type 2 diabetes mellitus with complication, without long-term current use of insulin (HCC) Last A1C 5.6 on 11/22. Pt is on a pureed baby food diet. Her Type II DM is well controlled with diet.   5. Essential hypertension Pt's hypertension is well controlled with metoprolol 25mg  and and losartan 50 mg.  6. Anemia due to chronic blood loss Hgb 11.3 hematocrit 34.7 collected on 1/27.   Functional status: She is non ambulatory due to her CVA and is using a stand up lift to the scooter or chair.   Review of Systems:  Review of Systems    Constitutional: Negative for fever, chills and weight loss.  HENT: Negative for congestion and sore throat.   Eyes: Negative for discharge and redness.  Respiratory: Negative for cough, sputum production, shortness of breath and wheezing.   Cardiovascular: Positive for leg swelling. Negative for chest pain and palpitations.  Gastrointestinal: Negative for nausea, abdominal pain, diarrhea, constipation and blood in stool.  Genitourinary: Negative for dysuria.  Musculoskeletal: Negative for back pain, joint pain, falls and neck pain.  Skin: Negative for rash.  Neurological: Positive for focal weakness (R sided hemiparesis). Negative for dizziness, tremors, speech change, weakness and headaches.  Psychiatric/Behavioral: Positive for memory loss. Negative for depression. The patient is not nervous/anxious and does not have insomnia.   All other systems reviewed and are negative.   Medications: Patient's Medications  New Prescriptions   No medications on file  Previous Medications   ACETAMINOPHEN (TYLENOL) 650 MG CR TABLET    Take 650 mg by mouth 2 (two) times daily. For pain   CARBOXYMETHYLCELLULOSE (REFRESH PLUS) 0.5 % SOLN    1 drop 3 (three) times daily.   DENTIFRICES (BIOTENE DRY MOUTH CARE DT)    Place 1 spray onto teeth 4 (four) times daily.   ESCITALOPRAM (LEXAPRO) 20 MG TABLET    Take 20 mg by mouth daily. For depression/ anxiety.   GLUCERNA (GLUCERNA) LIQD    Take 237 mLs by mouth. At breakfast and lunch   LEVOTHYROXINE (SYNTHROID, LEVOTHROID) 75 MCG TABLET    Take 75 mcg by mouth daily before breakfast. For thyroid therapy  LIVER OIL-ZINC OXIDE (DESITIN) 40 % OINTMENT    Apply 1 application topically as needed. Apply to rash on buttox/inner thighs after each BM for rash or irritation.   LORATADINE (CLARITIN) 10 MG TABLET    Take 10 mg by mouth daily.    LOSARTAN (COZAAR) 50 MG TABLET    Take 50 mg by mouth daily. For HTN   MELATONIN 5 MG TABS    Take 5 tablets by mouth at  bedtime.   METOPROLOL TARTRATE (LOPRESSOR) 25 MG TABLET    Take 25 mg by mouth 2 (two) times daily.   MULTIPLE VITAMIN (MULTIVITAMIN) TABLET    Take 1 tablet by mouth daily.   MULTIPLE VITAMINS-MINERALS (PRESERVISION AREDS PO)    Take by mouth daily.   OMEPRAZOLE (PRILOSEC) 20 MG CAPSULE    Take 20 mg by mouth daily. For GERD   OVER THE COUNTER MEDICATION    Take 1 spray by mouth 2 (two) times daily. Biotin spray   POLYETHYL GLYCOL-PROPYL GLYCOL (SYSTANE) 0.4-0.3 % SOLN    Apply 1 drop to eye 2 (two) times daily. One drop both eye every 2 hours as needed for dryness or irritation   SHARK LIVER OIL-COCOA BUTTER (PREPARATION H) 0.25-3-85.5 % SUPPOSITORY    Place 1 suppository rectally as needed for hemorrhoids.   TROLAMINE SALICYLATE (ASPERCREME) 10 % CREAM    Apply 1 application topically 2 (two) times daily as needed for muscle pain.   WITCH HAZEL LIQD    Apply 1 application topically at bedtime as needed.  Modified Medications   No medications on file  Discontinued Medications   CLOPIDOGREL (PLAVIX) 75 MG TABLET    Take 75 mg by mouth daily. For stroke risk reduction    Physical Exam: Filed Vitals:   05/09/15 1117  Weight: 164 lb 6.4 oz (74.571 kg)  Physical Exam  Constitutional: She is oriented to person, place, and time. She appears well-developed and well-nourished. No distress.  HENT:  Head: Normocephalic and atraumatic.  Eyes: EOM are normal. Pupils are equal, round, and reactive to light. Right eye exhibits no discharge. Left eye exhibits no discharge.  Neck: No JVD present.  Cardiovascular: Normal rate, regular rhythm, normal heart sounds and intact distal pulses.   Pt has a pacemaker  Pulmonary/Chest: Effort normal and breath sounds normal. No respiratory distress. She has no wheezes. She has no rales.  Fine rales to both bases  Abdominal: Soft. Bowel sounds are normal. She exhibits no distension and no mass. There is no tenderness.  Musculoskeletal: She exhibits no edema or  tenderness.  R hemiparesis  Lymphadenopathy:    She has no cervical adenopathy.  Neurological: She is alert and oriented to person, place, and time. No cranial nerve deficit.  Oriented but forgetful of the details of her care  Skin: Skin is warm and dry. No rash noted. She is not diaphoretic. No erythema.  Psychiatric: She has a normal mood and affect. Her behavior is normal.  Nursing note and vitals reviewed.  Wt Readings from Last 3 Encounters:  05/09/15 164 lb 6.4 oz (74.571 kg)  03/04/15 161 lb (73.029 kg)  01/24/15 165 lb (74.844 kg)     Labs reviewed: Basic Metabolic Panel:  Recent Labs  08/10/14 01/25/15  NA 136* 138  K 4.3 4.3  BUN 16 14  CREATININE 0.7 0.5    Liver Function Tests:  Recent Labs  01/25/15  AST 18  ALT 10  ALKPHOS 33    CBC:  Recent Labs  08/10/14 04/01/15  WBC 10.0 8.1  HGB 11.6* 11.3*  HCT 35* 35*  PLT 184 198    Lab Results  Component Value Date   TSH 0.76 01/25/2015   Lab Results  Component Value Date   HGBA1C 5.6 01/25/2015    Assessment/Plan  1. Heme positive stool Improved.  Pt has had no heme positive stools since d/c of Plavix  2. Cerebrovascular disease Stable BP stable.   3. Hemiparesis affecting right side as late effect of cerebrovascular accident ([place]) Stable. Continue use of electric wheelchair and stand-up lift with assistance.   4. Controlled type 2 diabetes mellitus with complication, without long-term current use of insulin (HCC) Stable. Controlled with diet.  Monitor A1C annually  5. Essential hypertension -Stable. Controlled -Continue metoprolol 25 mg and losartan 50 mg.  6. Anemia due to chronic blood loss -Stable, no further episodes of bleeding -Periodically monitor CBC     Cindi Carbon, [place] [phone]

## 2015-05-31 DIAGNOSIS — E1159 Type 2 diabetes mellitus with other circulatory complications: Secondary | ICD-10-CM | POA: Diagnosis not present

## 2015-05-31 DIAGNOSIS — L84 Corns and callosities: Secondary | ICD-10-CM | POA: Diagnosis not present

## 2015-05-31 DIAGNOSIS — L602 Onychogryphosis: Secondary | ICD-10-CM | POA: Diagnosis not present

## 2015-06-03 ENCOUNTER — Encounter: Payer: Self-pay | Admitting: Cardiology

## 2015-06-03 LAB — CUP PACEART REMOTE DEVICE CHECK
Battery Voltage: 2.79 V
Date Time Interrogation Session: 20170228183127
Implantable Lead Implant Date: 20110506
Implantable Lead Location: 753859
Implantable Lead Model: 5076
Implantable Lead Model: 5076
Lead Channel Impedance Value: 493 Ohm
Lead Channel Pacing Threshold Pulse Width: 0.4 ms
Lead Channel Setting Pacing Amplitude: 2 V
Lead Channel Setting Pacing Amplitude: 2.5 V
Lead Channel Setting Sensing Sensitivity: 2.8 mV
MDC IDC LEAD IMPLANT DT: 20110506
MDC IDC LEAD LOCATION: 753860
MDC IDC MSMT BATTERY IMPEDANCE: 439 Ohm
MDC IDC MSMT BATTERY REMAINING LONGEVITY: 84 mo
MDC IDC MSMT LEADCHNL RA PACING THRESHOLD AMPLITUDE: 0.625 V
MDC IDC MSMT LEADCHNL RA PACING THRESHOLD PULSEWIDTH: 0.4 ms
MDC IDC MSMT LEADCHNL RA SENSING INTR AMPL: 1 mV
MDC IDC MSMT LEADCHNL RV IMPEDANCE VALUE: 452 Ohm
MDC IDC MSMT LEADCHNL RV PACING THRESHOLD AMPLITUDE: 1.125 V
MDC IDC SET LEADCHNL RV PACING PULSEWIDTH: 0.4 ms
MDC IDC STAT BRADY AP VP PERCENT: 1 %
MDC IDC STAT BRADY AP VS PERCENT: 0 %
MDC IDC STAT BRADY AS VP PERCENT: 99 %
MDC IDC STAT BRADY AS VS PERCENT: 0 %

## 2015-06-12 IMAGING — RF DG SWALLOWING FUNCTION - NRPT [facility]
4 series · 4 of 4 positions shown · non-contrast
Comparison: none

[Series 1: run · 1 of 1 slices shown (1 of 4)]
[im 1/1]
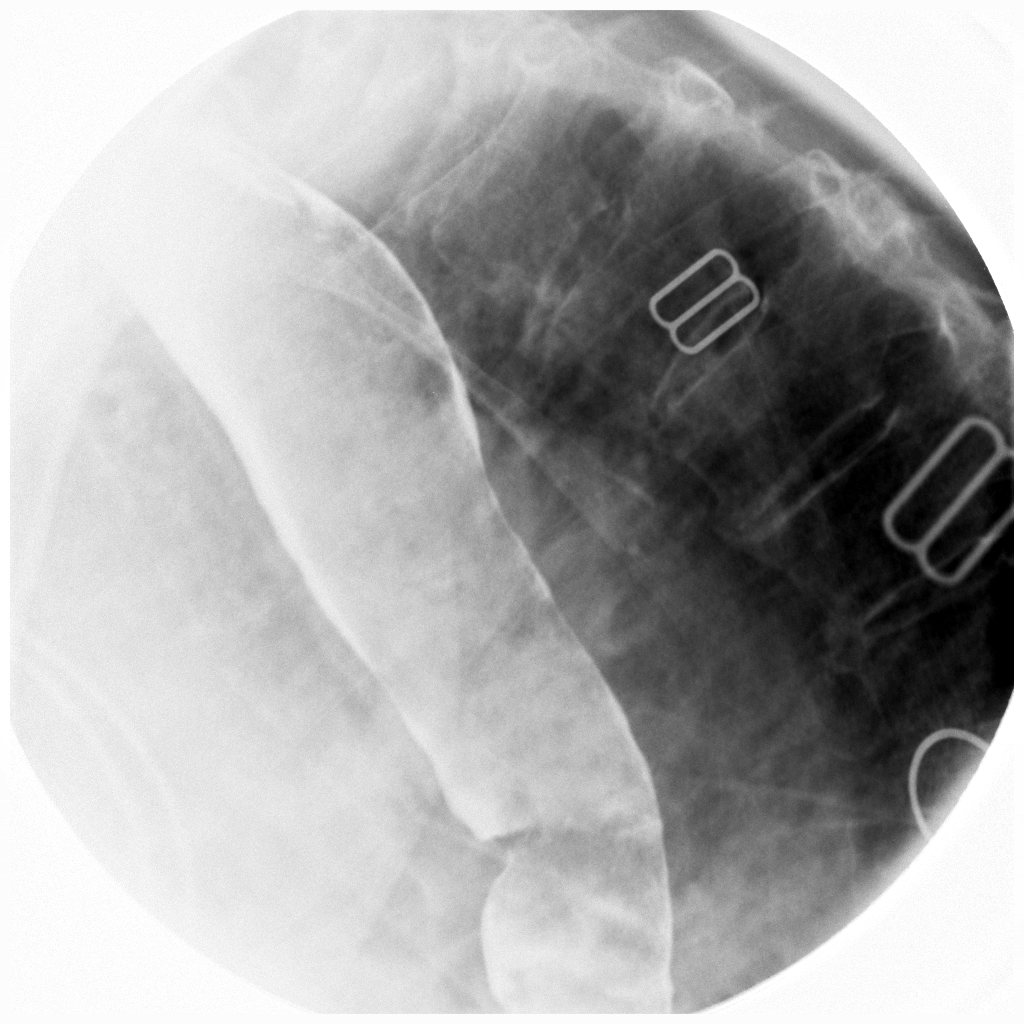

[Series 2: run · 1 of 1 slices shown (2 of 4)]
[im 1/1]
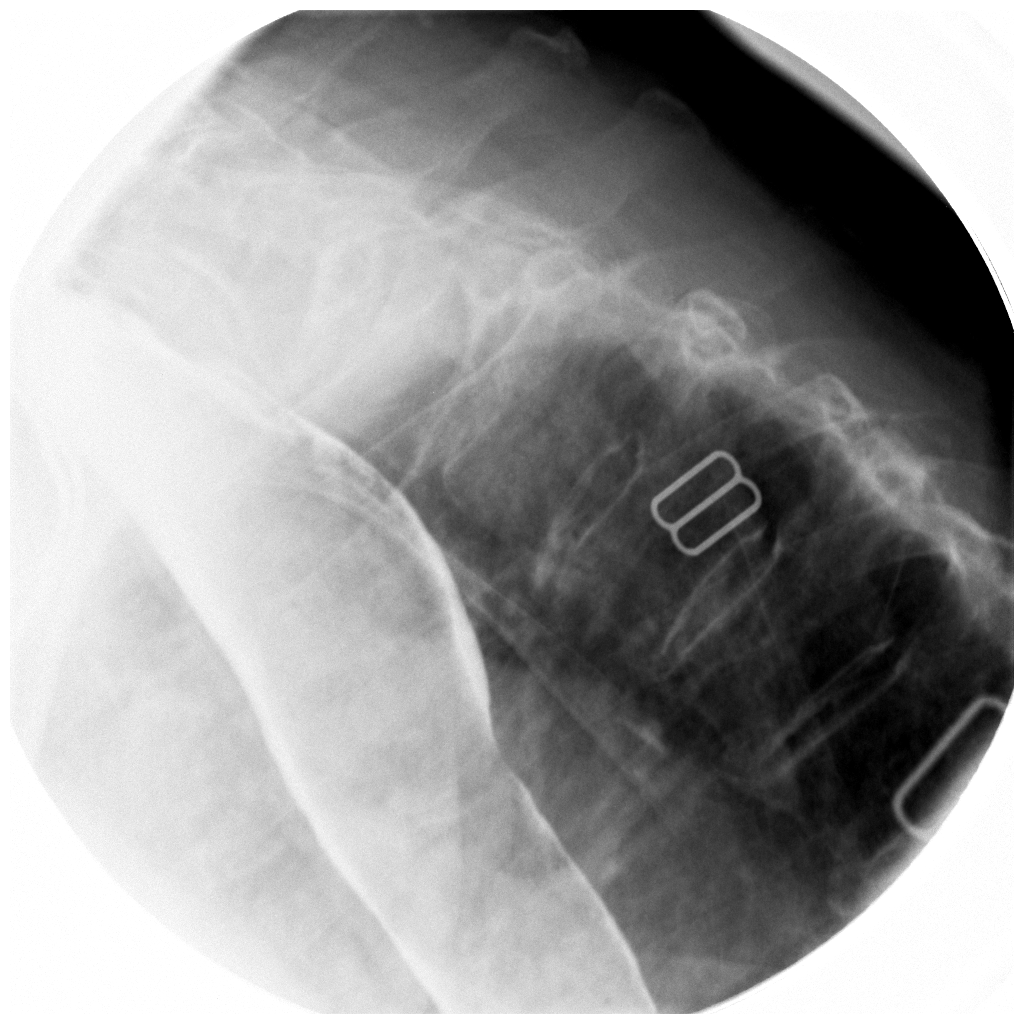

[Series 3: run · 1 of 1 slices shown (3 of 4)]
[im 1/1]
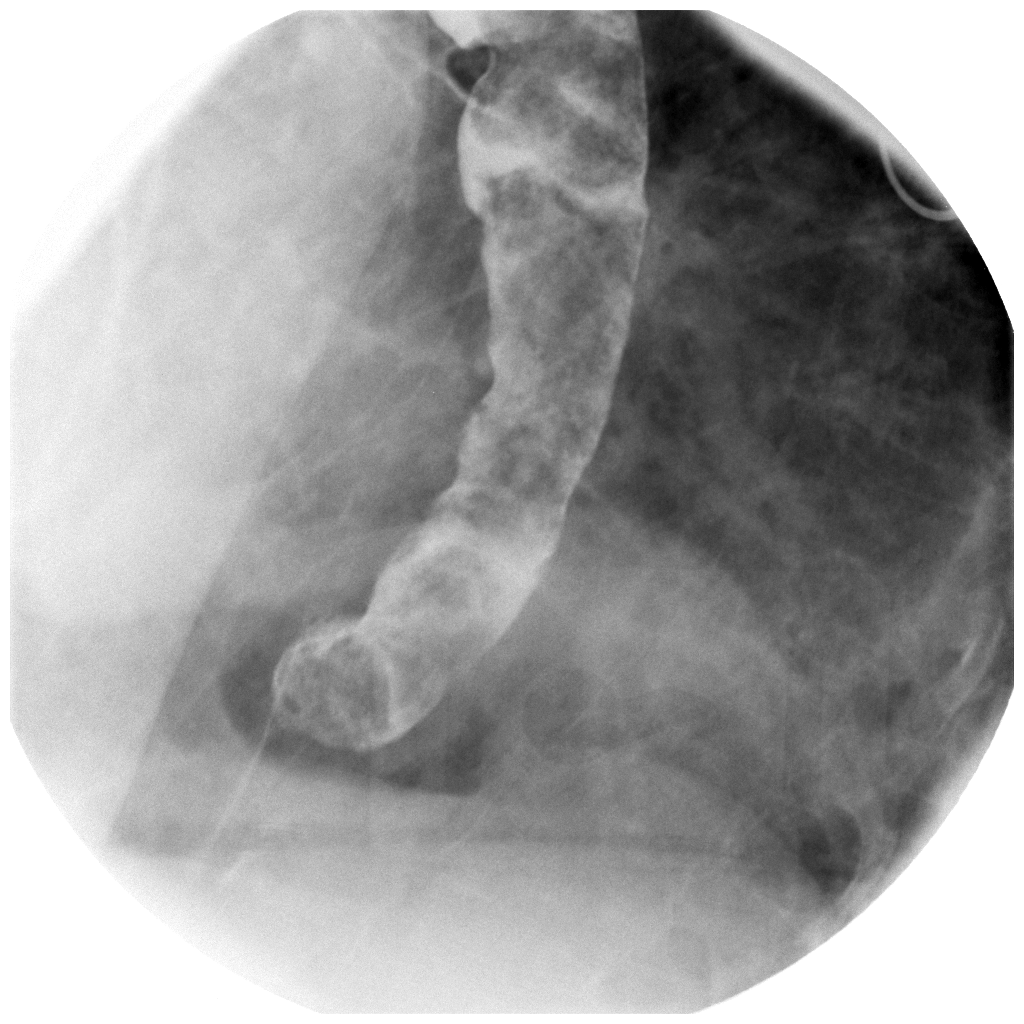

[Series 4: run · 1 of 1 slices shown (4 of 4)]
[im 1/1]
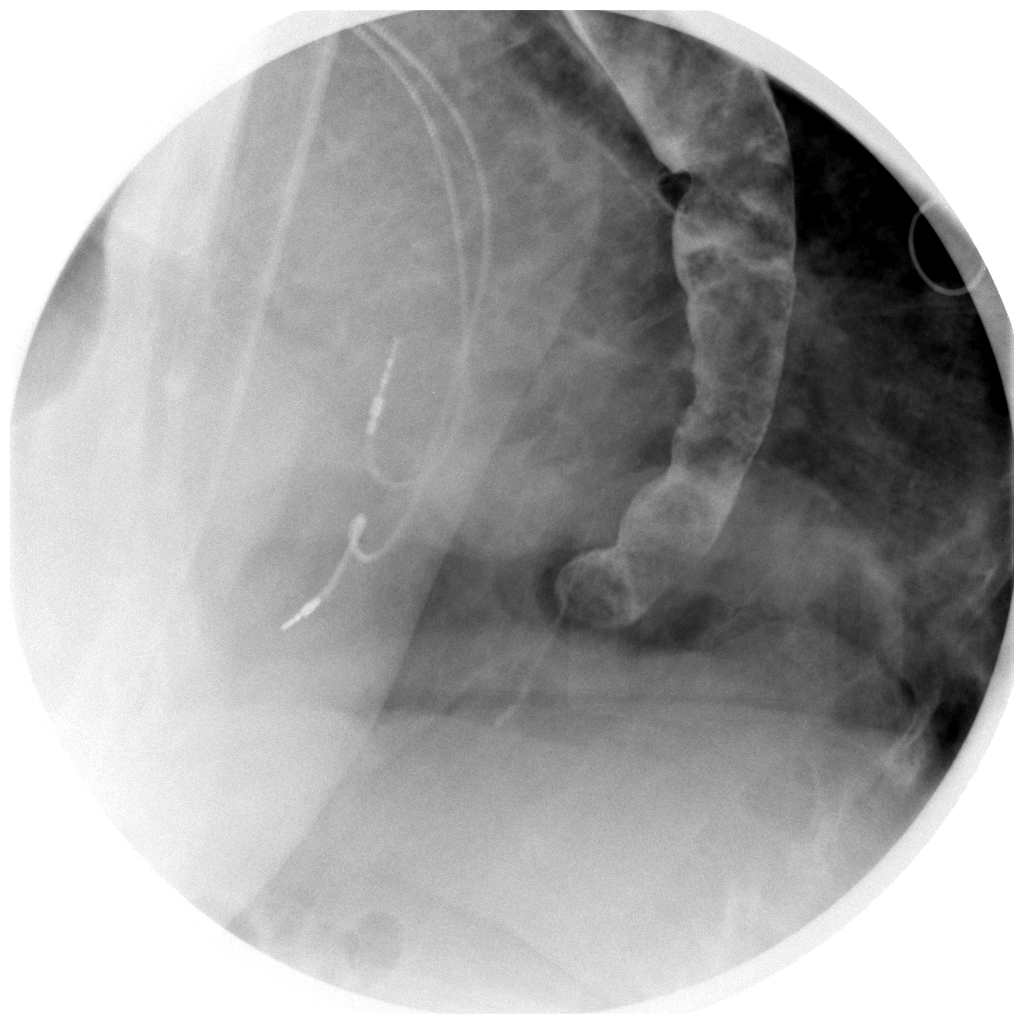

[4 of 4 positions shown; findings below may reference images not displayed]

FLUOROSCOPY FOR SWALLOWING FUNCTION STUDY:
Fluoroscopy was provided for swallowing function study, which was administered by a speech pathologist.  Final results and recommendations from this study are contained within the speech pathology report.

## 2015-06-13 ENCOUNTER — Non-Acute Institutional Stay (SKILLED_NURSING_FACILITY): Payer: Medicare Other | Admitting: Adult Health

## 2015-06-13 DIAGNOSIS — K224 Dyskinesia of esophagus: Secondary | ICD-10-CM

## 2015-06-13 DIAGNOSIS — I679 Cerebrovascular disease, unspecified: Secondary | ICD-10-CM | POA: Diagnosis not present

## 2015-06-13 DIAGNOSIS — D500 Iron deficiency anemia secondary to blood loss (chronic): Secondary | ICD-10-CM | POA: Diagnosis not present

## 2015-06-13 DIAGNOSIS — R195 Other fecal abnormalities: Secondary | ICD-10-CM

## 2015-06-13 DIAGNOSIS — F329 Major depressive disorder, single episode, unspecified: Secondary | ICD-10-CM | POA: Diagnosis not present

## 2015-06-13 DIAGNOSIS — E118 Type 2 diabetes mellitus with unspecified complications: Secondary | ICD-10-CM

## 2015-06-13 DIAGNOSIS — F32A Depression, unspecified: Secondary | ICD-10-CM

## 2015-06-14 ENCOUNTER — Encounter: Payer: Self-pay | Admitting: Adult Health

## 2015-06-14 NOTE — Progress Notes (Signed)
Patient ID: Rachel Cooke, female   DOB: 12/05/1919, 82 y.o.   MRN: [id number]   [facility]  Code Status:  DNR  Patient Care Team: [name], DO as PCP - General (Geriatric Medicine) [facility] Clarene Essex, MD as Attending Physician (Gastroenterology) Garvin Fila, MD as Consulting Physician (Neurology)  Chief Complaint  Patient presents with  . Medical Management of Chronic Issues    HPI:  82 yo female long term care resident seen for medical mgt of her chronic diseases.  She has a h/o hemorrhoids, right hammer toe, DMII with circulatory complications, complete AV block s/p PPM, right hemiplegia, amnesia, primary OA, esophageal dyskinesia, NSTEMI,  dysphagia, htn, hyperlipidemia, major depressive disorder, and breast cancer.    1. Controlled type 2 diabetes mellitus with complication, without long-term current use of insulin (HCC) -blood sugars range 172-175 in the am -resident on very restrictive diet see below -not currently on meds  2. Esophageal dysmotility -currently on baby food due to hx of recurrent impaction of food in the esophagus -frequently voices disliking this  3. Cerebrovascular disease -s/p CVA with right hemiplegia -not currently on antiplatelet therapy, see below  4. Heme positive stool -noted in Feb, stools + x3 -plavix discontinued, no further episodes of bleeding -family decided to forego workup  5. Anemia due to chronic blood loss Lab Results  Component Value Date   HGB 11.3* 04/01/2015  -stable, no further episodes of bleeding -on prilosec qd  6. Depression -stable on lexapro -occasionally has labile mood per staff but mostly pleasant -weight stable, no loss of appetite, anhedonia etc   Functional status: She is non ambulatory due to her CVA and is using a stand up lift to the scooter or chair.   Review of Systems:  Review of Systems  Constitutional: Negative for fever,  chills and weight loss.  HENT: Negative for congestion and sore throat.   Eyes: Negative for discharge and redness.  Respiratory: Negative for cough, sputum production, shortness of breath and wheezing.   Cardiovascular: Positive for leg swelling. Negative for chest pain and palpitations.  Gastrointestinal: Negative for nausea, abdominal pain, diarrhea, constipation and blood in stool.  Genitourinary: Negative for dysuria.  Musculoskeletal: Negative for back pain, joint pain, falls and neck pain.  Skin: Negative for rash.  Neurological: Positive for focal weakness (R sided hemiparesis). Negative for dizziness, tremors, speech change, weakness and headaches.  Psychiatric/Behavioral: Positive for memory loss. Negative for depression. The patient is not nervous/anxious and does not have insomnia.   All other systems reviewed and are negative.   Medications: Patient's Medications  New Prescriptions   No medications on file  Previous Medications   ACETAMINOPHEN (TYLENOL) 650 MG CR TABLET    Take 650 mg by mouth 2 (two) times daily. For pain   DENTIFRICES (BIOTENE DRY MOUTH CARE DT)    Place 1 spray onto teeth 4 (four) times daily.   ESCITALOPRAM (LEXAPRO) 20 MG TABLET    Take 20 mg by mouth daily. For depression/ anxiety.   GLUCERNA (GLUCERNA) LIQD    Take 237 mLs by mouth. At breakfast and lunch   LEVOTHYROXINE (SYNTHROID, LEVOTHROID) 75 MCG TABLET    Take 75 mcg by mouth daily before breakfast. For thyroid therapy   LIVER OIL-ZINC OXIDE (DESITIN) 40 % OINTMENT    Apply 1 application topically as needed. Apply to rash on buttox/inner thighs after each BM for rash or irritation.   LORATADINE (CLARITIN) 10 MG  TABLET    Take 10 mg by mouth daily.    LOSARTAN (COZAAR) 50 MG TABLET    Take 50 mg by mouth daily. For HTN   MELATONIN 5 MG TABS    Take 5 tablets by mouth at bedtime.   METOPROLOL TARTRATE (LOPRESSOR) 25 MG TABLET    Take 25 mg by mouth 2 (two) times daily.   MULTIPLE VITAMIN  (MULTIVITAMIN) TABLET    Take 1 tablet by mouth daily.   MULTIPLE VITAMINS-MINERALS (PRESERVISION AREDS PO)    Take by mouth daily.   OMEPRAZOLE (PRILOSEC) 20 MG CAPSULE    Take 20 mg by mouth daily. For GERD   OVER THE COUNTER MEDICATION    Take 1 spray by mouth 2 (two) times daily. Biotin spray   POLYETHYL GLYCOL-PROPYL GLYCOL (SYSTANE) 0.4-0.3 % SOLN    Apply 1 drop to eye 2 (two) times daily. One drop both eye every 2 hours as needed for dryness or irritation   SHARK LIVER OIL-COCOA BUTTER (PREPARATION H) 0.25-3-85.5 % SUPPOSITORY    Place 1 suppository rectally as needed for hemorrhoids.   TROLAMINE SALICYLATE (ASPERCREME) 10 % CREAM    Apply 1 application topically 2 (two) times daily as needed for muscle pain.   WITCH HAZEL LIQD    Apply 1 application topically at bedtime as needed.  Modified Medications   No medications on file  Discontinued Medications   CARBOXYMETHYLCELLULOSE (REFRESH PLUS) 0.5 % SOLN    1 drop 3 (three) times daily.    Physical Exam: Filed Vitals:   06/14/15 1542  Weight: 162 lb 3.2 oz (73.573 kg)  Physical Exam  Constitutional: She is oriented to person, place, and time. She appears well-developed and well-nourished. No distress.  HENT:  Head: Normocephalic and atraumatic.  Eyes: EOM are normal. Pupils are equal, round, and reactive to light. Right eye exhibits no discharge. Left eye exhibits no discharge.  Neck: No JVD present.  Cardiovascular: Normal rate, regular rhythm, normal heart sounds and intact distal pulses.   Pt has a pacemaker  Pulmonary/Chest: Effort normal and breath sounds normal. No respiratory distress. She has no wheezes. She has no rales.  Fine rales to both bases  Abdominal: Soft. Bowel sounds are normal. She exhibits no distension and no mass. There is no tenderness.  Musculoskeletal: She exhibits no edema or tenderness.  R hemiparesis  Lymphadenopathy:    She has no cervical adenopathy.  Neurological: She is alert and oriented to  person, place, and time. No cranial nerve deficit.  Oriented but forgetful of the details of her care  Skin: Skin is warm and dry. No rash noted. She is not diaphoretic. No erythema.  Psychiatric: She has a normal mood and affect. Her behavior is normal.  Nursing note and vitals reviewed.  Wt Readings from Last 3 Encounters:  06/14/15 162 lb 3.2 oz (73.573 kg)  05/09/15 164 lb 6.4 oz (74.571 kg)  03/04/15 161 lb (73.029 kg)     Labs reviewed: Basic Metabolic Panel:  Recent Labs  08/10/14 01/25/15  NA 136* 138  K 4.3 4.3  BUN 16 14  CREATININE 0.7 0.5    Liver Function Tests:  Recent Labs  01/25/15  AST 18  ALT 10  ALKPHOS 33    CBC:  Recent Labs  08/10/14 04/01/15  WBC 10.0 8.1  HGB 11.6* 11.3*  HCT 35* 35*  PLT 184 198    Lab Results  Component Value Date   TSH 0.76 01/25/2015   Lab  Results  Component Value Date   HGBA1C 5.6 01/25/2015    Assessment/Plan  1. Controlled type 2 diabetes mellitus with complication, without long-term current use of insulin (HCC) -stable without meds -would not put any further restrictions on her already limited diet which effects her quality of life  2. Esophageal dysmotility -continue current diet -aspirations prec -no episodes of choking  3. Cerebrovascular disease -s/p CVA -goals of care are comfort based -no longer on antiplatelet therapy see below  4. Heme positive stool -no further overt bleeding episodes per staff -periodically monitor H/H  5. Anemia due to chronic blood loss -stable -continue to monitor  6. Depression -stable -continue [place], ANP [facility] [phone]

## 2015-06-15 DIAGNOSIS — E1121 Type 2 diabetes mellitus with diabetic nephropathy: Secondary | ICD-10-CM | POA: Diagnosis not present

## 2015-06-15 LAB — HEMOGLOBIN A1C: Hemoglobin A1C: 7.8

## 2015-07-08 ENCOUNTER — Non-Acute Institutional Stay (SKILLED_NURSING_FACILITY): Payer: Medicare Other | Admitting: Adult Health

## 2015-07-08 DIAGNOSIS — K224 Dyskinesia of esophagus: Secondary | ICD-10-CM

## 2015-07-08 DIAGNOSIS — G4700 Insomnia, unspecified: Secondary | ICD-10-CM

## 2015-07-08 DIAGNOSIS — I10 Essential (primary) hypertension: Secondary | ICD-10-CM | POA: Diagnosis not present

## 2015-07-08 DIAGNOSIS — J029 Acute pharyngitis, unspecified: Secondary | ICD-10-CM | POA: Diagnosis not present

## 2015-07-08 DIAGNOSIS — D500 Iron deficiency anemia secondary to blood loss (chronic): Secondary | ICD-10-CM

## 2015-07-08 DIAGNOSIS — E038 Other specified hypothyroidism: Secondary | ICD-10-CM

## 2015-07-25 NOTE — Progress Notes (Signed)
Patient ID: Rachel Cooke, female   DOB: 1920-02-24, 82 y.o.   MRN: [id number]   [facility]  Code Status:  DNR  Patient Care Team: [name], DO as PCP - General (Geriatric Medicine) [facility] Clarene Essex, MD as Attending Physician (Gastroenterology) Garvin Fila, MD as Consulting Physician (Neurology)  CC: management of chronic disease  HPI:  82 yo female long term care resident seen for medical mgt of her chronic diseases.  She has a h/o hemorrhoids, right hammer toe, DMII with circulatory complications, complete AV block s/p PPM, right hemiplegia, amnesia, primary OA, esophageal dyskinesia, NSTEMI,  dysphagia, htn, hyperlipidemia, major depressive disorder, and breast cancer.   Resident denies any complaints today. She has a hx of allergic rhinitis and has intermittent rhinorrhea/cough with fever, or purulent sputum.   Remains on pureed baby food diet due to the above hx and is tolerating well.  Weight is stable at 160 lbs  Functional status: She is non ambulatory due to her CVA and is using a stand up lift to the scooter or chair.   Review of Systems:  Review of Systems  Constitutional: Negative for fever, chills and weight loss.  HENT: Negative for congestion and sore throat.  Rhinorrhea  Eyes: Negative for discharge and redness.  Respiratory: dry cough, No sputum production, shortness of breath or wheezing.   Cardiovascular: Positive for leg swelling. Negative for chest pain and palpitations.  Gastrointestinal: Negative for nausea, abdominal pain, diarrhea, constipation and blood in stool.  Genitourinary: Negative for dysuria.  Musculoskeletal: Negative for back pain, joint pain, falls and neck pain.  Skin: Negative for rash.  Neurological: Positive for focal weakness (R sided hemiparesis). Negative for dizziness, tremors, speech change, weakness and headaches.  Psychiatric/Behavioral: Positive for memory loss.  Negative for depression. The patient is not nervous/anxious and does not have insomnia.   All other systems reviewed and are negative.   Medications: Patient's Medications  New Prescriptions   No medications on file  Previous Medications   ACETAMINOPHEN (TYLENOL) 650 MG CR TABLET    Take 650 mg by mouth 2 (two) times daily. For pain   DENTIFRICES (BIOTENE DRY MOUTH CARE DT)    Place 1 spray onto teeth 4 (four) times daily.   ESCITALOPRAM (LEXAPRO) 20 MG TABLET    Take 20 mg by mouth daily. For depression/ anxiety.   GLUCERNA (GLUCERNA) LIQD    Take 237 mLs by mouth. At breakfast and lunch   LEVOTHYROXINE (SYNTHROID, LEVOTHROID) 75 MCG TABLET    Take 75 mcg by mouth daily before breakfast. For thyroid therapy   LORATADINE (CLARITIN) 10 MG TABLET    Take 10 mg by mouth daily.    LOSARTAN (COZAAR) 50 MG TABLET    Take 50 mg by mouth daily. For HTN   MELATONIN 5 MG TABS    Take 5 tablets by mouth at bedtime.   METOPROLOL TARTRATE (LOPRESSOR) 25 MG TABLET    Take 25 mg by mouth 2 (two) times daily.   MULTIPLE VITAMIN (MULTIVITAMIN) TABLET    Take 1 tablet by mouth daily.   MULTIPLE VITAMINS-MINERALS (PRESERVISION AREDS PO)    Take by mouth daily.   OMEPRAZOLE (PRILOSEC) 20 MG CAPSULE    Take 20 mg by mouth daily. For GERD   POLYETHYL GLYCOL-PROPYL GLYCOL (SYSTANE) 0.4-0.3 % SOLN    Apply 1 drop to eye 2 (two) times daily. One drop both eye every 2 hours as needed for dryness  or irritation   SHARK LIVER OIL-COCOA BUTTER (PREPARATION H) 0.25-3-85.5 % SUPPOSITORY    Place 1 suppository rectally as needed for hemorrhoids.   TROLAMINE SALICYLATE (ASPERCREME) 10 % CREAM    Apply 1 application topically 2 (two) times daily as needed for muscle pain.   WITCH HAZEL LIQD    Apply 1 application topically at bedtime as needed.  Modified Medications   No medications on file  Discontinued Medications   LIVER OIL-ZINC OXIDE (DESITIN) 40 % OINTMENT    Apply 1 application topically as needed. Apply to rash on  buttox/inner thighs after each BM for rash or irritation.   OVER THE COUNTER MEDICATION    Take 1 spray by mouth 2 (two) times daily. Biotin spray    Physical Exam: There were no vitals filed for this visit.Physical Exam  Constitutional: She is oriented to person, place, and time. She appears well-developed and well-nourished. No distress.  HENT:  Head: Normocephalic and atraumatic.  Eyes: EOM are normal. Pupils are equal, round, and reactive to light. Right eye exhibits no discharge. Left eye exhibits no discharge.  Neck: No JVD present.  Cardiovascular: Normal rate, regular rhythm, normal heart sounds and intact distal pulses.   Pt has a pacemaker  Pulmonary/Chest: Effort normal and breath sounds normal. No respiratory distress. She has no wheezes. She has no rales.  Fine rales to both bases  Abdominal: Soft. Bowel sounds are normal. She exhibits no distension and no mass. There is no tenderness.  Musculoskeletal: She exhibits no edema or tenderness.  R hemiparesis  Lymphadenopathy:    She has no cervical adenopathy.  Neurological: She is alert and oriented to person, place, and time. No cranial nerve deficit.  Oriented but forgetful of the details of her care  Skin: Skin is warm and dry. No rash noted. She is not diaphoretic. No erythema.  Psychiatric: She has a normal mood and affect. Her behavior is normal.  Nursing note and vitals reviewed.    Labs reviewed: Basic Metabolic Panel:  Recent Labs  08/10/14 01/25/15  NA 136* 138  K 4.3 4.3  BUN 16 14  CREATININE 0.7 0.5    Liver Function Tests:  Recent Labs  01/25/15  AST 18  ALT 10  ALKPHOS 33    CBC:  Recent Labs  08/10/14 04/01/15  WBC 10.0 8.1  HGB 11.6* 11.3*  HCT 35* 35*  PLT 184 198    Lab Results  Component Value Date   TSH 0.76 01/25/2015   Lab Results  Component Value Date   HGBA1C 7.8 06/15/2015    Assessment/Plan  1. Esophageal dysmotility -no recent episodes of choking -tolerating  baby food pureed diet well  2. Essential hypertension Controlled Continue current meds  3. Other specified hypothyroidism -continue synthroid -tsh due in nov  4. Anemia due to chronic blood loss -stable h/h with no further obvious bleeding after discontinuing plavix  5. Insomnia -controlled -continue melatonin  6. Allergic pharyngitis -intermittently has rhinorrhea/cough -would continue claritin      Cindi Carbon, [place] [phone]

## 2015-08-02 ENCOUNTER — Ambulatory Visit (INDEPENDENT_AMBULATORY_CARE_PROVIDER_SITE_OTHER): Payer: Medicare Other | Admitting: *Deleted

## 2015-08-02 DIAGNOSIS — I442 Atrioventricular block, complete: Secondary | ICD-10-CM | POA: Diagnosis not present

## 2015-08-02 NOTE — Progress Notes (Signed)
Remote pacemaker transmission.   

## 2015-08-16 LAB — CUP PACEART REMOTE DEVICE CHECK
Battery Remaining Longevity: 83 mo
Brady Statistic AP VS Percent: 0 %
Brady Statistic AS VP Percent: 100 %
Brady Statistic AS VS Percent: 0 %
Implantable Lead Implant Date: 20110506
Implantable Lead Location: 753859
Lead Channel Impedance Value: 461 Ohm
Lead Channel Impedance Value: 480 Ohm
Lead Channel Setting Pacing Amplitude: 2 V
Lead Channel Setting Pacing Amplitude: 2.5 V
MDC IDC LEAD IMPLANT DT: 20110506
MDC IDC LEAD LOCATION: 753860
MDC IDC MSMT BATTERY IMPEDANCE: 464 Ohm
MDC IDC MSMT BATTERY VOLTAGE: 2.78 V
MDC IDC SESS DTM: 20170530123100
MDC IDC SET LEADCHNL RV PACING PULSEWIDTH: 0.4 ms
MDC IDC SET LEADCHNL RV SENSING SENSITIVITY: 2.8 mV
MDC IDC STAT BRADY AP VP PERCENT: 0 %

## 2015-08-23 ENCOUNTER — Encounter: Payer: Self-pay | Admitting: Cardiology

## 2015-08-23 ENCOUNTER — Non-Acute Institutional Stay (SKILLED_NURSING_FACILITY): Payer: Medicare Other | Admitting: Internal Medicine

## 2015-08-23 ENCOUNTER — Encounter: Payer: Self-pay | Admitting: Internal Medicine

## 2015-08-23 DIAGNOSIS — T18128D Food in esophagus causing other injury, subsequent encounter: Secondary | ICD-10-CM

## 2015-08-23 DIAGNOSIS — I69351 Hemiplegia and hemiparesis following cerebral infarction affecting right dominant side: Secondary | ICD-10-CM

## 2015-08-23 DIAGNOSIS — K224 Dyskinesia of esophagus: Secondary | ICD-10-CM | POA: Diagnosis not present

## 2015-08-23 DIAGNOSIS — E118 Type 2 diabetes mellitus with unspecified complications: Secondary | ICD-10-CM | POA: Diagnosis not present

## 2015-08-23 DIAGNOSIS — I10 Essential (primary) hypertension: Secondary | ICD-10-CM | POA: Diagnosis not present

## 2015-08-23 DIAGNOSIS — D500 Iron deficiency anemia secondary to blood loss (chronic): Secondary | ICD-10-CM

## 2015-08-23 NOTE — Progress Notes (Signed)
Patient ID: Rachel Cooke, female   DOB: [birth year], 82 y.o.   MRN: [id number]  Location:   [facility] Room Number: 109 Place of Service:  SNF (31) Provider:  Chloris Marcoux L. Mariea Clonts, D.O., C.M.D.  Hollace Kinnier, DO  Patient Care Team: [name], DO as PCP - General (Geriatric Medicine) [facility] Clarene Essex, MD as Attending Physician (Gastroenterology) Garvin Fila, MD as Consulting Physician (Neurology)  Extended Emergency Contact Information Primary Emergency Contact: Vernon Address: [address]          [place], [place] [address] [name] of [place] Phone: [phone] Mobile Phone: [phone] Relation: Daughter  Code Status:  dNR Goals of care: Advanced Directive information Advanced Directives 08/23/2015  Does patient have an advance directive? Yes  Type of Advance Directive Out of facility DNR (pink MOST or yellow form);Healthcare Power of Attorney  Copy of advanced directive(s) in chart? Yes  Pre-existing out of facility DNR order (yellow form or pink MOST form) Yellow form placed in chart (order not valid for inpatient use)     Chief Complaint  Patient presents with  . Medical Management of Chronic Issues    Routine Visit    HPI:  Pt is a 82 y.o. female seen today for medical management of chronic diseases.  She is feeling well.  She has her usual complaint about wanting to eat more liberally, but her family has been opposed due to several prior impactions of her esophagus.  She eats baby food now.  Her MMSE was 25/30 in May of 2016.  Her diabetes is well enough controlled for her age and goals with last hba1c in April 7.8.    She is due for prevnar--she was fine with it.    Past Medical History  Diagnosis Date  . Hypertension   . Dendritic keratitis 2012  . Malignant neoplasm of breast (female), unspecified site 1970    S/P Rt radical mastectomy  . Unspecified hypothyroidism 2009  . Type II or  unspecified type diabetes mellitus without mention of complication, not stated as uncontrolled 2003  . Unspecified vitamin D deficiency 2009  . Pure hyperglyceridemia 2004  . Anxiety state, unspecified   . Macular degeneration (senile) of retina, unspecified 2012  . Unspecified glaucoma 2013  . Tinnitus 2007  . Coronary atherosclerosis of native coronary artery 2003    non obstructive by Cath 2003  . Atrioventricular block, complete ([place]) 2003    s/p PPM 2003, generator chnage 2011  . Allergic rhinitis due to pollen   . Acquired cyst of kidney 2002  . Osteoarthrosis, unspecified whether generalized or localized, unspecified site 2013  . Unspecified arthropathy, pelvic region and thigh 2010  . Senile osteoporosis 2003  . Abnormality of gait 2007  . Edema 2003  . Personal history of colonic polyps      s/p colonoscopy/polypectomy 2003  . Cardiac pacemaker in situ 2003  . CVA (cerebral infarction) 06/28/2012    rt hemiparesis, dysphagia  . NSTEMI (non-ST elevated myocardial infarction) ([place]) 06/28/2012  . Urinary retention 07/07/2012  . Pacemaker   . Stroke ([place]) 06/28/2012  . Food impaction of esophagus 08/15/2012  . Esophageal dysmotility 08/19/2012  . Acute sinus infection 11/25/2012  . Altered mental status 11/28/2012  . Dysuria   . HTN (hypertension) 06/28/2012   Past Surgical History  Procedure Laterality Date  . Mastectomy Right 1970    Cancer  . Insert / replace / remove pacemaker    . Esophagogastroduodenoscopy  N/A 08/15/2012    Procedure: ESOPHAGOGASTRODUODENOSCOPY (EGD);  Surgeon: [name], MD;  [facility] END[facility]COPY;  Service: Endoscopy;  Laterality: N/A;  . Esophagoscopy N/A 08/15/2012    Procedure: ESOPHAGOSCOPY;  Surgeon: [name], MD;  Location: [facility];  Service: ENT;  Laterality: N/A;  . Foreign body removal esophageal N/A 08/15/2012    Procedure: REMOVAL FOREIGN BODY ESOPHAGEAL;  Surgeon: [name], MD;  Location: [facility] OR;  Service: ENT;  Laterality: N/A;  .  Esophagogastroduodenoscopy N/A 08/15/2012    Procedure: ESOPHAGOGASTRODUODENOSCOPY (EGD);  Surgeon: [name], MD;  Location: [facility];  Service: Endoscopy;  Laterality: N/A;  . Esophagogastroduodenoscopy N/A 10/03/2012    Procedure: ESOPHAGOGASTRODUODENOSCOPY (EGD);  Surgeon: [name], MD;  Location: [facility] ENDOSCOPY;  Service: Endoscopy;  Laterality: N/A;  . Savory dilation N/A 10/03/2012    Procedure: SAVORY DILATION;  Surgeon: [name], MD;  Location: [facility] ENDOSCOPY;  Service: Endoscopy;  Laterality: N/A;  . Botox injection N/A 10/03/2012    Procedure: BOTOX INJECTION;  Surgeon: [name], MD;  Location: [facility] ENDOSCOPY;  Service: Endoscopy;  Laterality: N/A;  . Esophagogastroduodenoscopy N/A 12/22/2012    Procedure: ESOPHAGOGASTRODUODENOSCOPY (EGD);  Surgeon: [name], MD;  Location: [facility] ENDOSCOPY;  Service: Endoscopy;  Laterality: N/A;    Allergies  Allergen Reactions  . Lactose Intolerance (Gi)       Medication List       This list is accurate as of: 08/23/15 12:01 PM.  Always use your most recent med list.               acetaminophen 650 MG CR tablet  Commonly known as:  TYLENOL  Take 650 mg by mouth 2 (two) times daily. For pain     BIOTENE DRY MOUTH CARE DT  Place 1 spray onto teeth 4 (four) times daily.     dextromethorphan-guaiFENesin 30-600 MG 12hr tablet  Commonly known as:  MUCINEX DM  Take 1 tablet by mouth 2 (two) times daily as needed for cough.     escitalopram 20 MG tablet  Commonly known as:  LEXAPRO  Take 20 mg by mouth daily. For depression/ anxiety.     GLUCERNA Liqd  Take 237 mLs by mouth. At breakfast and lunch     ipratropium-albuterol 0.5-2.5 (3) MG/3ML Soln  Commonly known as:  DUONEB  Take 3 mLs by nebulization every 6 (six) hours as needed (cough/wheezing).     levothyroxine 75 MCG tablet  Commonly known as:  SYNTHROID, LEVOTHROID  Take 75 mcg by mouth daily before breakfast. For thyroid therapy     liver oil-zinc oxide 40 % ointment    Commonly known as:  DESITIN  Apply 1 application topically as needed for irritation.     loratadine 10 MG tablet  Commonly known as:  CLARITIN  Take 10 mg by mouth daily.     losartan 50 MG tablet  Commonly known as:  COZAAR  Take 50 mg by mouth daily. For HTN     Melatonin 5 MG Tabs  Take 5 tablets by mouth at bedtime.     metoprolol tartrate 25 MG tablet  Commonly known as:  LOPRESSOR  Take 25 mg by mouth 2 (two) times daily.     multivitamin tablet  Take 1 tablet by mouth daily.     omeprazole 20 MG capsule  Commonly known as:  PRILOSEC  Take 20 mg by mouth daily. For GERD     PRESERVISION AREDS PO  Take  by mouth daily.     trolamine salicylate 10 % cream  Commonly known as:  ASPERCREME  Apply 1 application topically 2 (two) times daily as needed for muscle pain.     VICKS VAPORUB 4.7-1.2-2.6 % Oint  Apply topically as needed (congestion).     Witch Hazel 14 % Liqd  Apply topically as needed (for hemorrhoid flare up).        Review of Systems  Constitutional: Negative for fever, chills, appetite change and fatigue.  HENT: Negative for congestion.   Eyes: Negative for visual disturbance.  Respiratory: Negative for chest tightness and shortness of breath.   Cardiovascular: Positive for leg swelling. Negative for chest pain and palpitations.  Gastrointestinal: Negative for abdominal pain.  Genitourinary: Negative for dysuria.  Musculoskeletal: Positive for gait problem.  Neurological: Positive for weakness. Negative for dizziness.  Psychiatric/Behavioral: Positive for confusion.    Immunization History  Administered Date(s) Administered  . Influenza Whole 12/17/2012  . Influenza-Unspecified 12/08/2013, 12/16/2014  . PPD Test 07/18/2012   Pertinent  Health Maintenance Due  Topic Date Due  . FOOT EXAM  10/30/1931  . OPHTHALMOLOGY EXAM  10/30/1931  . DEXA SCAN  10/30/1986  . PNA vac Low Risk Adult (1 of 2 - PCV13) 10/30/1986  . INFLUENZA VACCINE   10/04/2015  . HEMOGLOBIN A1C  12/15/2015   Fall Risk  01/24/2015  Falls in the past year? No   Functional Status Survey:    Filed Vitals:   08/23/15 1145  BP: 128/70  Pulse: 78  Temp: 97.6 F (36.4 C)  TempSrc: Oral  Resp: 18  Weight: 159 lb (72.122 kg)  SpO2: 97%   Body mass index is 30.06 kg/(m^2). Physical Exam  Constitutional: She appears well-developed and well-nourished. No distress.  Cardiovascular: Normal rate, regular rhythm, normal heart sounds and intact distal pulses.   Pulmonary/Chest: Effort normal and breath sounds normal. No respiratory distress.  Abdominal: Soft. Bowel sounds are normal. She exhibits no distension. There is no tenderness.  Musculoskeletal: She exhibits edema.  Gets around in wheelchair   Neurological: She is alert.  Skin: Skin is warm and dry.  Psychiatric: She has a normal mood and affect.    Labs reviewed:  Recent Labs  01/25/15  NA 138  K 4.3  BUN 14  CREATININE 0.5    Recent Labs  01/25/15  AST 18  ALT 10  ALKPHOS 33    Recent Labs  04/01/15  WBC 8.1  HGB 11.3*  HCT 35*  PLT 198   Lab Results  Component Value Date   TSH 0.76 01/25/2015   Lab Results  Component Value Date   HGBA1C 7.8 06/15/2015   Lab Results  Component Value Date   CHOL 155 08/11/2013   HDL 38 08/11/2013   LDLCALC 96 08/11/2013   TRIG 196* 08/11/2013   CHOLHDL 4.1 07/01/2012   Assessment/Plan 1. Hemiparesis affecting right side as late effect of cerebrovascular accident ([place]) -stable, gets around in wheelchair, usually seen in her recliner in her room  2. Controlled type 2 diabetes mellitus with complication, without long-term current use of insulin (HCC) -control is good considering her goals, age, and limited food options  3. Esophageal dysmotility -she is tired of her dietary restrictions and wants to liberalize her diet but her family and the staff are fearful of the impaction recurring--has happened at least twice before and GI  advises against liberalization due to this -of note, pt does seem to understand these consequences despite a little  bit of memory loss   4. Food impaction of esophagus, subsequent encounter -due to #3 -on pureed foods  5. Essential hypertension -bp at goal with current therapy so cont same  6. Anemia due to chronic blood loss -h/h in Jan was reasonable  Family/ staff Communication: discussed with SNF nursing, nurse supervisor, DNS  Labs/tests ordered:  Cbc, bmp, hba1c, flp in August; prevnar needed and ordered if ok with pt and family

## 2015-08-25 DIAGNOSIS — Z23 Encounter for immunization: Secondary | ICD-10-CM | POA: Diagnosis not present

## 2015-08-30 DIAGNOSIS — L84 Corns and callosities: Secondary | ICD-10-CM | POA: Diagnosis not present

## 2015-08-30 DIAGNOSIS — E1159 Type 2 diabetes mellitus with other circulatory complications: Secondary | ICD-10-CM | POA: Diagnosis not present

## 2015-08-30 DIAGNOSIS — L602 Onychogryphosis: Secondary | ICD-10-CM | POA: Diagnosis not present

## 2015-09-09 ENCOUNTER — Non-Acute Institutional Stay (SKILLED_NURSING_FACILITY): Payer: Medicare Other | Admitting: Adult Health

## 2015-09-09 DIAGNOSIS — Z636 Dependent relative needing care at home: Secondary | ICD-10-CM | POA: Diagnosis not present

## 2015-09-09 DIAGNOSIS — F329 Major depressive disorder, single episode, unspecified: Secondary | ICD-10-CM | POA: Diagnosis not present

## 2015-09-09 DIAGNOSIS — F32A Depression, unspecified: Secondary | ICD-10-CM

## 2015-09-09 NOTE — Progress Notes (Signed)
Patient ID: Rachel Cooke, female   DOB: 03-02-1920, 82 y.o.   MRN: [id number]  Location:     [facility] of Service:   SNF Provider:   Cindi Carbon, ANP [facility] [phone]  Hollace Kinnier, DO  Patient Care Team: [name], DO as PCP - General (Geriatric Medicine) [facility] Clarene Essex, MD as Attending Physician (Gastroenterology) Garvin Fila, MD as Consulting Physician (Neurology)  Extended Emergency Contact Information Primary Emergency Contact: [place] Address: [address]          [place], [place] [address] [name] of [place] Phone: [phone] Mobile Phone: [phone] Relation: Daughter  Code Status:  DNR Goals of care: Advanced Directive information Advanced Directives 08/23/2015  Does patient have an advance directive? Yes  Type of Advance Directive Out of facility DNR (pink MOST or yellow form);Healthcare Power of Attorney  Copy of advanced directive(s) in chart? Yes  Pre-existing out of facility DNR order (yellow form or pink MOST form) Yellow form placed in chart (order not valid for inpatient use)     Chief Complaint  Patient presents with  . Acute Visit    depression, emotional outburst    HPI:  Pt is a 82 y.o. female seen today for an acute visit for an emotional outburst that occurred with her daughter. She was acting out of character and her daughter was concerned that there was something medically wrong with and asked that she be evaluated. Rachel Cooke had a CVA several years ago with right sided hemiparesis, as well as dysphagia and esophageal dysmotility. She has had food impactions before and was recommended a diet consistency of baby food. She has felt depressed about this situation and about the loss of independence and control.  Her family has signed a waiver to allow her to have crackers and cereal despite the risk on 6/30.  She feels fine has no cough, sob, cp, GI upset,dysuria,  or other issues. She reports she is just frustrated with her life. Her mother died when she was 17 years old and there are some underlying issues as well with her death. There are also financial concerns because she is not able to control her money and her daughter does that for her.   The resident has some short term memory loss that has not changed. MMSE scores remain stable with good orientation but poor recall.  Past Medical History  Diagnosis Date  . Hypertension   . Dendritic keratitis 2012  . Malignant neoplasm of breast (female), unspecified site 1970    S/P Rt radical mastectomy  . Unspecified hypothyroidism 2009  . Type II or unspecified type diabetes mellitus without mention of complication, not stated as uncontrolled 2003  . Unspecified vitamin D deficiency 2009  . Pure hyperglyceridemia 2004  . Anxiety state, unspecified   . Macular degeneration (senile) of retina, unspecified 2012  . Unspecified glaucoma 2013  . Tinnitus 2007  . Coronary atherosclerosis of native coronary artery 2003    non obstructive by Cath 2003  . Atrioventricular block, complete ([place]) 2003    s/p PPM 2003, generator chnage 2011  . Allergic rhinitis due to pollen   . Acquired cyst of kidney 2002  . Osteoarthrosis, unspecified whether generalized or localized, unspecified site 2013  . Unspecified arthropathy, pelvic region and thigh 2010  . Senile osteoporosis 2003  . Abnormality of gait 2007  . Edema 2003  . Personal history of colonic polyps  s/p colonoscopy/polypectomy 2003  . Cardiac pacemaker in situ 2003  . CVA (cerebral infarction) 06/28/2012    rt hemiparesis, dysphagia  . NSTEMI (non-ST elevated myocardial infarction) ([place]) 06/28/2012  . Urinary retention 07/07/2012  . Pacemaker   . Stroke ([place]) 06/28/2012  . Food impaction of esophagus 08/15/2012  . Esophageal dysmotility 08/19/2012  . Acute sinus infection 11/25/2012  . Altered mental status 11/28/2012  . Dysuria   . HTN  (hypertension) 06/28/2012   Past Surgical History  Procedure Laterality Date  . Mastectomy Right 1970    Cancer  . Insert / replace / remove pacemaker    . Esophagogastroduodenoscopy N/A 08/15/2012    Procedure: ESOPHAGOGASTRODUODENOSCOPY (EGD);  Surgeon: [name], MD;  Location: [facility] ENDOSCOPY;  Service: Endoscopy;  Laterality: N/A;  . Esophagoscopy N/A 08/15/2012    Procedure: ESOPHAGOSCOPY;  Surgeon: [name], MD;  Location: [facility];  Service: ENT;  Laterality: N/A;  . Foreign body removal esophageal N/A 08/15/2012    Procedure: REMOVAL FOREIGN BODY ESOPHAGEAL;  Surgeon: [name], MD;  Location: [facility] OR;  Service: ENT;  Laterality: N/A;  . Esophagogastroduodenoscopy N/A 08/15/2012    Procedure: ESOPHAGOGASTRODUODENOSCOPY (EGD);  Surgeon: [name], MD;  Location: [facility];  Service: Endoscopy;  Laterality: N/A;  . Esophagogastroduodenoscopy N/A 10/03/2012    Procedure: ESOPHAGOGASTRODUODENOSCOPY (EGD);  Surgeon: [name], MD;  Location: [facility] ENDOSCOPY;  Service: Endoscopy;  Laterality: N/A;  . Savory dilation N/A 10/03/2012    Procedure: SAVORY DILATION;  Surgeon: [name], MD;  Location: [facility] ENDOSCOPY;  Service: Endoscopy;  Laterality: N/A;  . Botox injection N/A 10/03/2012    Procedure: BOTOX INJECTION;  Surgeon: [name], MD;  Location: [facility] ENDOSCOPY;  Service: Endoscopy;  Laterality: N/A;  . Esophagogastroduodenoscopy N/A 12/22/2012    Procedure: ESOPHAGOGASTRODUODENOSCOPY (EGD);  Surgeon: [name], MD;  Location: [facility] ENDOSCOPY;  Service: Endoscopy;  Laterality: N/A;    Allergies  Allergen Reactions  . Lactose Intolerance (Gi)       Medication List       This list is accurate as of: 09/09/15  9:54 AM.  Always use your most recent med list.               acetaminophen 650 MG CR tablet  Commonly known as:  TYLENOL  Take 650 mg by mouth 2 (two) times daily. For pain     BIOTENE DRY MOUTH CARE DT  Place 1 spray onto teeth 4 (four) times daily.      dextromethorphan-guaiFENesin 30-600 MG 12hr tablet  Commonly known as:  MUCINEX DM  Take 1 tablet by mouth 2 (two) times daily as needed for cough.     escitalopram 20 MG tablet  Commonly known as:  LEXAPRO  Take 20 mg by mouth daily. For depression/ anxiety.     GLUCERNA Liqd  Take 237 mLs by mouth. At breakfast and lunch     ipratropium-albuterol 0.5-2.5 (3) MG/3ML Soln  Commonly known as:  DUONEB  Take 3 mLs by nebulization every 6 (six) hours as needed (cough/wheezing).     levothyroxine 75 MCG tablet  Commonly known as:  SYNTHROID, LEVOTHROID  Take 75 mcg by mouth daily before breakfast. For thyroid therapy     liver oil-zinc oxide 40 % ointment  Commonly known as:  DESITIN  Apply 1 application topically as needed for irritation.     loratadine 10 MG tablet  Commonly known as:  CLARITIN  Take 10 mg by mouth daily.  losartan 50 MG tablet  Commonly known as:  COZAAR  Take 50 mg by mouth daily. For HTN     Melatonin 5 MG Tabs  Take 5 tablets by mouth at bedtime.     metoprolol tartrate 25 MG tablet  Commonly known as:  LOPRESSOR  Take 25 mg by mouth 2 (two) times daily.     multivitamin tablet  Take 1 tablet by mouth daily.     omeprazole 20 MG capsule  Commonly known as:  PRILOSEC  Take 20 mg by mouth daily. For GERD     PRESERVISION AREDS PO  Take by mouth daily.     trolamine salicylate 10 % cream  Commonly known as:  ASPERCREME  Apply 1 application topically 2 (two) times daily as needed for muscle pain.     VICKS VAPORUB 4.7-1.2-2.6 % Oint  Apply topically as needed (congestion).     Witch Hazel 14 % Liqd  Apply topically as needed (for hemorrhoid flare up).        Review of Systems  Constitutional: Negative for fever, chills, diaphoresis, activity change, appetite change, fatigue and unexpected weight change.  HENT: Positive for trouble swallowing. Negative for congestion, rhinorrhea and sore throat.   Respiratory: Negative for cough,  shortness of breath and wheezing.   Cardiovascular: Negative for chest pain, palpitations and leg swelling.  Gastrointestinal: Negative for abdominal pain, diarrhea, constipation and abdominal distention.  Genitourinary: Negative for dysuria and difficulty urinating.  Musculoskeletal: Positive for arthralgias and gait problem. Negative for myalgias, back pain and joint swelling.  Neurological: Positive for weakness (right sided hemiparesis). Negative for dizziness, tremors, seizures, syncope, facial asymmetry, speech difficulty, light-headedness, numbness and headaches.  Psychiatric/Behavioral: Positive for confusion, dysphoric mood and agitation. Negative for suicidal ideas, hallucinations, behavioral problems, sleep disturbance and self-injury. The patient is nervous/anxious.     Immunization History  Administered Date(s) Administered  . Influenza Whole 12/17/2012  . Influenza-Unspecified 12/08/2013, 12/16/2014  . PPD Test 07/18/2012  . Pneumococcal Polysaccharide-23 08/27/2015   Pertinent  Health Maintenance Due  Topic Date Due  . FOOT EXAM  10/30/1931  . DEXA SCAN  10/30/1986  . OPHTHALMOLOGY EXAM  09/29/2015  . INFLUENZA VACCINE  10/04/2015  . HEMOGLOBIN A1C  12/15/2015  . PNA vac Low Risk Adult (2 of 2 - PCV13) 08/26/2016   Fall Risk  01/24/2015  Falls in the past year? No   Functional Status Survey:    Filed Vitals:   09/12/15 1552  BP: 132/64  Pulse: 81  Temp: 97 F (36.1 C)  Resp: 18  SpO2: 95%   There is no weight on file to calculate BMI. Physical Exam  Constitutional: She is oriented to person, place, and time. No distress.  HENT:  Head: Normocephalic and atraumatic.  Right Ear: External ear normal.  Left Ear: External ear normal.  Nose: Nose normal.  Mouth/Throat: Oropharynx is clear and moist. No oropharyngeal exudate.  Eyes: Conjunctivae are normal. Pupils are equal, round, and reactive to light. Right eye exhibits no discharge. Left eye exhibits no  discharge.  Neck: No JVD present.  Cardiovascular: Normal rate and regular rhythm.   No murmur heard. No edema  Pulmonary/Chest: Effort normal and breath sounds normal. No respiratory distress. She has no wheezes.  Abdominal: Bowel sounds are normal. She exhibits no distension.  Neurological: She is alert and oriented to person, place, and time.  Skin: Skin is warm and dry. She is not diaphoretic.  Psychiatric: She has a normal mood and affect.  Labs reviewed:  Recent Labs  01/25/15  NA 138  K 4.3  BUN 14  CREATININE 0.5    Recent Labs  01/25/15  AST 18  ALT 10  ALKPHOS 33    Recent Labs  04/01/15  WBC 8.1  HGB 11.3*  HCT 35*  PLT 198   Lab Results  Component Value Date   TSH 0.76 01/25/2015   Lab Results  Component Value Date   HGBA1C 7.8 06/15/2015   Lab Results  Component Value Date   CHOL 155 08/11/2013   HDL 38 08/11/2013   LDLCALC 96 08/11/2013   TRIG 196* 08/11/2013   CHOLHDL 4.1 07/01/2012    Significant Diagnostic Results in last 30 days:  No results found.  Assessment/Plan  1. Depression Ms. Debski appears depressed to about her current living situation and loss of independence. Her MMSE scores have not changed, in fact they have improved. She continues on lexapro. I do not see any medical area for concern. All of her complaints today were psychosocial. Could consider adjunctive medication if this does not improved.   2. Caregiver burden I believe her daughter is stressed about caring for her mother and they both seem to have difficulty with the role reversal. There are underlying psychosocial issues that need to be addressed. I left a message concerning this with her daughter.   Family/ staff Communication: discussed with staff/resident  Labs/tests ordered:  NA

## 2015-09-12 ENCOUNTER — Non-Acute Institutional Stay (SKILLED_NURSING_FACILITY): Payer: Medicare Other | Admitting: Adult Health

## 2015-09-12 ENCOUNTER — Encounter: Payer: Self-pay | Admitting: Adult Health

## 2015-09-12 DIAGNOSIS — H109 Unspecified conjunctivitis: Secondary | ICD-10-CM

## 2015-09-12 DIAGNOSIS — R195 Other fecal abnormalities: Secondary | ICD-10-CM | POA: Diagnosis not present

## 2015-09-12 DIAGNOSIS — E785 Hyperlipidemia, unspecified: Secondary | ICD-10-CM | POA: Diagnosis not present

## 2015-09-12 DIAGNOSIS — R0602 Shortness of breath: Secondary | ICD-10-CM | POA: Diagnosis not present

## 2015-09-12 DIAGNOSIS — K224 Dyskinesia of esophagus: Secondary | ICD-10-CM

## 2015-09-12 DIAGNOSIS — K649 Unspecified hemorrhoids: Secondary | ICD-10-CM

## 2015-09-12 DIAGNOSIS — E139 Other specified diabetes mellitus without complications: Secondary | ICD-10-CM | POA: Diagnosis not present

## 2015-09-12 DIAGNOSIS — D509 Iron deficiency anemia, unspecified: Secondary | ICD-10-CM | POA: Diagnosis not present

## 2015-09-12 DIAGNOSIS — R0789 Other chest pain: Secondary | ICD-10-CM

## 2015-09-12 DIAGNOSIS — R05 Cough: Secondary | ICD-10-CM | POA: Diagnosis not present

## 2015-09-12 DIAGNOSIS — E1121 Type 2 diabetes mellitus with diabetic nephropathy: Secondary | ICD-10-CM | POA: Diagnosis not present

## 2015-09-12 NOTE — Progress Notes (Addendum)
Patient ID: Rachel Cooke, female   DOB: 1919/07/14, 82 y.o.   MRN: [id number]  [facility]   [facility]   Place of Service:  SNF (31) Provider:   Cindi Carbon, ANP [facility] [phone]   Hollace Kinnier, DO  Patient Care Team: [name], DO as PCP - General (Geriatric Medicine) [facility] Clarene Essex, MD as Attending Physician (Gastroenterology) Garvin Fila, MD as Consulting Physician (Neurology)  Extended Emergency Contact Information Primary Emergency Contact: [place] Address: [address]          [place], [place] [address] [name] of [place] Phone: [phone] Mobile Phone: [phone] Relation: Daughter   Code Status:  DNR Goals of care: Advanced Directive information Advanced Directives 08/23/2015  Does patient have an advance directive? Yes  Type of Advance Directive Out of facility DNR (pink MOST or yellow form);Healthcare Power of Attorney  Copy of advanced directive(s) in chart? Yes  Pre-existing out of facility DNR order (yellow form or pink MOST form) Yellow form placed in chart (order not valid for inpatient use)     Chief Complaint  Patient presents with  . Acute Visit    sore throat, rectal bleeding    HPI:  Pt is a 82 y.o. female seen today for an acute visit for sore throat and rectal bleeding. She has a hx of heme pos stools and anemia with a + family hx for colon ca.  We decided not to work this up due to her age and debility. On Saturday (7/8) she reported that she started having a sore throat and a raspy voice. On Sunday (7/9) she had some left neck pain and radiating to her arm. She was given nitro and this resolved, which was a new complaint. Today she is reporting chest discomfort and mild sob with normal 02 sats and no fever. Her throat remains sore.  In addition when she went to the BR today there was blood in the toilet, but not in the stool itself. She does not have any abd pain or  vomiting.   She was able to eat lunch and able to swallow her pills crushed as usual. However, it is important to note that she has a hx of CVA with dysphagia, as well as esophageal impaction. She decided to sign a waiver in order to have items other than baby food despite this risk for QOL issues. Since 6/30 she has been allowed to occasionally have Special K cereal, rice krispies, and crackers.  Resident is a DNR and does not want aggressive care but would like treatment for medical issues that promote QOL. 12 lead EKG performed today shows no changes compared to previous tracings, shows a ventricular paced rhythm.  She was given a duoneb treatment and the mild dyspnea resolved.   Past Medical History  Diagnosis Date  . Hypertension   . Dendritic keratitis 2012  . Malignant neoplasm of breast (female), unspecified site 1970    S/P Rt radical mastectomy  . Unspecified hypothyroidism 2009  . Type II or unspecified type diabetes mellitus without mention of complication, not stated as uncontrolled 2003  . Unspecified vitamin D deficiency 2009  . Pure hyperglyceridemia 2004  . Anxiety state, unspecified   . Macular degeneration (senile) of retina, unspecified 2012  . Unspecified glaucoma 2013  . Tinnitus 2007  . Coronary atherosclerosis of native coronary artery 2003    non obstructive by Cath 2003  . Atrioventricular block, complete ([place]) 2003  s/p PPM 2003, generator chnage 2011  . Allergic rhinitis due to pollen   . Acquired cyst of kidney 2002  . Osteoarthrosis, unspecified whether generalized or localized, unspecified site 2013  . Unspecified arthropathy, pelvic region and thigh 2010  . Senile osteoporosis 2003  . Abnormality of gait 2007  . Edema 2003  . Personal history of colonic polyps      s/p colonoscopy/polypectomy 2003  . Cardiac pacemaker in situ 2003  . CVA (cerebral infarction) 06/28/2012    rt hemiparesis, dysphagia  . NSTEMI (non-ST elevated myocardial infarction)  ([place]) 06/28/2012  . Urinary retention 07/07/2012  . Pacemaker   . Stroke ([place]) 06/28/2012  . Food impaction of esophagus 08/15/2012  . Esophageal dysmotility 08/19/2012  . Acute sinus infection 11/25/2012  . Altered mental status 11/28/2012  . Dysuria   . HTN (hypertension) 06/28/2012   Past Surgical History  Procedure Laterality Date  . Mastectomy Right 1970    Cancer  . Insert / replace / remove pacemaker    . Esophagogastroduodenoscopy N/A 08/15/2012    Procedure: ESOPHAGOGASTRODUODENOSCOPY (EGD);  Surgeon: [name], MD;  Location: [facility] ENDOSCOPY;  Service: Endoscopy;  Laterality: N/A;  . Esophagoscopy N/A 08/15/2012    Procedure: ESOPHAGOSCOPY;  Surgeon: [name], MD;  Location: [employer];  Service: ENT;  Laterality: N/A;  . Foreign body removal esophageal N/A 08/15/2012    Procedure: REMOVAL FOREIGN BODY ESOPHAGEAL;  Surgeon: [name], MD;  Location: [facility] OR;  Service: ENT;  Laterality: N/A;  . Esophagogastroduodenoscopy N/A 08/15/2012    Procedure: ESOPHAGOGASTRODUODENOSCOPY (EGD);  Surgeon: [name], MD;  Location: [place];  Service: Endoscopy;  Laterality: N/A;  . Esophagogastroduodenoscopy N/A 10/03/2012    Procedure: ESOPHAGOGASTRODUODENOSCOPY (EGD);  Surgeon: [name], MD;  Location: [facility] END[facility]COPY;  Service: Endoscopy;  Laterality: N/A;  . Savory dilation N/A 10/03/2012    Procedure: SAVORY DILATION;  Surgeon: [name], MD;  Location: [facility] ENDOSCOPY;  Service: Endoscopy;  Laterality: N/A;  . Botox injection N/A 10/03/2012    Procedure: BOTOX INJECTION;  Surgeon: [name], MD;  Location: [facility] ENDOSCOPY;  Service: Endoscopy;  Laterality: N/A;  . Esophagogastroduodenoscopy N/A 12/22/2012    Procedure: ESOPHAGOGASTRODUODENOSCOPY (EGD);  Surgeon: [name], MD;  Location: [facility] ENDOSCOPY;  Service: Endoscopy;  Laterality: N/A;    Allergies  Allergen Reactions  . Lactose Intolerance (Gi)       Medication List       This list is accurate as of: 09/12/15  3:16 PM.  Always use  your most recent med list.               acetaminophen 650 MG CR tablet  Commonly known as:  TYLENOL  Take 650 mg by mouth 2 (two) times daily. For pain     BIOTENE DRY MOUTH CARE DT  Place 1 spray onto teeth 4 (four) times daily.     dextromethorphan-guaiFENesin 30-600 MG 12hr tablet  Commonly known as:  MUCINEX DM  Take 1 tablet by mouth 2 (two) times daily as needed for cough.     escitalopram 20 MG tablet  Commonly known as:  LEXAPRO  Take 20 mg by mouth daily. For depression/ anxiety.     GLUCERNA Liqd  Take 237 mLs by mouth. At breakfast and lunch     ipratropium-albuterol 0.5-2.5 (3) MG/3ML Soln  Commonly known as:  DUONEB  Take 3 mLs by nebulization every 6 (six) hours as needed (cough/wheezing).     levothyroxine  75 MCG tablet  Commonly known as:  SYNTHROID, LEVOTHROID  Take 75 mcg by mouth daily before breakfast. For thyroid therapy     liver oil-zinc oxide 40 % ointment  Commonly known as:  DESITIN  Apply 1 application topically as needed for irritation.     loratadine 10 MG tablet  Commonly known as:  CLARITIN  Take 10 mg by mouth daily.     losartan 50 MG tablet  Commonly known as:  COZAAR  Take 50 mg by mouth daily. For HTN     Melatonin 5 MG Tabs  Take 5 tablets by mouth at bedtime.     metoprolol tartrate 25 MG tablet  Commonly known as:  LOPRESSOR  Take 25 mg by mouth 2 (two) times daily.     multivitamin tablet  Take 1 tablet by mouth daily.     omeprazole 20 MG capsule  Commonly known as:  PRILOSEC  Take 20 mg by mouth daily. For GERD     PRESERVISION AREDS PO  Take by mouth daily.     trolamine salicylate 10 % cream  Commonly known as:  ASPERCREME  Apply 1 application topically 2 (two) times daily as needed for muscle pain.     VICKS VAPORUB 4.7-1.2-2.6 % Oint  Apply topically as needed (congestion).     Witch Hazel 14 % Liqd  Apply topically as needed (for hemorrhoid flare up).        Review of Systems  Constitutional:  Negative for fever, chills, diaphoresis, activity change, appetite change, fatigue and unexpected weight change.  HENT: Positive for sore throat, trouble swallowing and voice change. Negative for congestion, ear discharge, ear pain and rhinorrhea.   Eyes: Negative for visual disturbance.  Respiratory: Positive for shortness of breath (mild). Negative for cough and wheezing.   Cardiovascular: Negative for chest pain, palpitations and leg swelling.  Gastrointestinal: Positive for blood in stool. Negative for nausea, vomiting, abdominal pain, diarrhea, constipation, abdominal distention and rectal pain.  Genitourinary: Negative for dysuria and difficulty urinating.  Musculoskeletal: Positive for arthralgias and gait problem. Negative for myalgias, back pain and joint swelling.  Skin: Negative for rash and wound.  Neurological: Negative for dizziness, tremors, seizures, syncope, facial asymmetry, speech difficulty, weakness, light-headedness, numbness and headaches.  Psychiatric/Behavioral: Negative for behavioral problems, confusion and agitation.       Mild memory loss    Immunization History  Administered Date(s) Administered  . Influenza Whole 12/17/2012  . Influenza-Unspecified 12/08/2013, 12/16/2014  . PPD Test 07/18/2012  . Pneumococcal Polysaccharide-23 08/27/2015   Pertinent  Health Maintenance Due  Topic Date Due  . FOOT EXAM  10/30/1931  . DEXA SCAN  10/30/1986  . OPHTHALMOLOGY EXAM  09/29/2015  . INFLUENZA VACCINE  10/04/2015  . HEMOGLOBIN A1C  12/15/2015  . PNA vac Low Risk Adult (2 of 2 - PCV13) 08/26/2016   Fall Risk  01/24/2015  Falls in the past year? No   Functional Status Survey:    Filed Vitals:   09/12/15 1512  BP: 128/77  Pulse: 89  Temp: 97.8 F (36.6 C)  Resp: 18  SpO2: 95%   There is no weight on file to calculate BMI. Physical Exam  Constitutional: She is oriented to person, place, and time. No distress.  HENT:  Head: Normocephalic and  atraumatic.  Nose: Nose normal.  Mouth/Throat: No oropharyngeal exudate.  Mild erythema to throat, hoarseness noted  Neck: No JVD present.  Cardiovascular: Normal rate and regular rhythm.   No murmur heard.  Pulmonary/Chest: Effort normal. She has no wheezes.  Mild bibasilar crackles unchanged, mild increased wob  Abdominal: Soft. Bowel sounds are normal. She exhibits no distension and no mass. There is no tenderness. There is no rebound and no guarding.  Musculoskeletal:  Right hemiplegia from CVA  Lymphadenopathy:    She has cervical adenopathy (neck tenderness on both sides but no deformity).  Neurological: She is alert and oriented to person, place, and time.  Skin: Skin is warm and dry. She is not diaphoretic.  Psychiatric: She has a normal mood and affect.  Nursing note and vitals reviewed.   Labs reviewed:  Recent Labs  01/25/15  NA 138  K 4.3  BUN 14  CREATININE 0.5    Recent Labs  01/25/15  AST 18  ALT 10  ALKPHOS 33    Recent Labs  04/01/15  WBC 8.1  HGB 11.3*  HCT 35*  PLT 198   Lab Results  Component Value Date   TSH 0.76 01/25/2015   Lab Results  Component Value Date   HGBA1C 7.8 06/15/2015   Lab Results  Component Value Date   CHOL 155 08/11/2013   HDL 38 08/11/2013   LDLCALC 96 08/11/2013   TRIG 196* 08/11/2013   CHOLHDL 4.1 07/01/2012    Significant Diagnostic Results in last 30 days:  No results found.  Assessment/Plan  1. Esophageal dysmotility  She is able to pass food and denies n/v/abd pain. . I spoke with Dr. Watt Climes and he is doubtful of impaction if she can pass food. She has some neck tenderness and Dr. Watt Climes mentioned esophageal perforation. She does not have a fever, or any other signs of sepsis.Could be due to cervical adenopathy.  I had a long discussion with her daughter and our goals of care are comfort based. If there was an esophageal impaction she would benefit from disimpaction as a means to provide comfort but any  other aggressive means such as surgery for perforation would not be in line with her goals of care. She has had many issues with depression and dislike of her prescribed diet of baby food. She lives in skilled care and is dependent for most ADL's due to a stroke and right hemiplegia. Her daughter is interested in hospice care if she continues to decline.  -D/C prilosec and try nexium 40 mg qd -d/c crackers in diet and continue baby food only -Keep HOB elevated 45 degrees and asp prec in place  2. Chest discomfort -2 view CXR rule out pna -rapid strep pending for sore throat -ekg negative -resolved with duoneb -would start Augmentin 875 BID for 7 days to cover for aspiration  3. Heme positive stool -noted but work up due to goals of care -check f/u CBC  4. Hemorrhoids, unspecified hemorrhoid type -hemorrhoid cream qhs for 7 days  5. Conjunctivitis of right eye -gentamicin 0.5 in ribbon BID for 7 days   Family/ staff Communication: Discussed with nsg supervisor and resident's daughter [name] and Dr. Watt Climes  Labs/tests ordered:  CBC, CMP, lipid panel, A1C, TSH, CXR, 12 lead

## 2015-09-14 DIAGNOSIS — R319 Hematuria, unspecified: Secondary | ICD-10-CM | POA: Diagnosis not present

## 2015-09-14 DIAGNOSIS — N390 Urinary tract infection, site not specified: Secondary | ICD-10-CM | POA: Diagnosis not present

## 2015-09-19 ENCOUNTER — Non-Acute Institutional Stay (SKILLED_NURSING_FACILITY): Payer: Medicare Other | Admitting: Adult Health

## 2015-09-19 ENCOUNTER — Encounter: Payer: Self-pay | Admitting: Adult Health

## 2015-09-19 DIAGNOSIS — D649 Anemia, unspecified: Secondary | ICD-10-CM

## 2015-09-19 DIAGNOSIS — J209 Acute bronchitis, unspecified: Secondary | ICD-10-CM | POA: Diagnosis not present

## 2015-09-19 DIAGNOSIS — K224 Dyskinesia of esophagus: Secondary | ICD-10-CM | POA: Diagnosis not present

## 2015-09-19 DIAGNOSIS — D72829 Elevated white blood cell count, unspecified: Secondary | ICD-10-CM

## 2015-09-19 LAB — CBC AND DIFFERENTIAL
HCT: 34 % — AB (ref 36–46)
Hemoglobin: 10.6 g/dL — AB (ref 12.0–16.0)
Platelets: 282 10*3/uL (ref 150–399)
WBC: 11.7 10^3/mL

## 2015-09-19 LAB — BASIC METABOLIC PANEL
BUN: 11 mg/dL (ref 4–21)
Creatinine: 0.6 mg/dL (ref 0.5–1.1)
GLUCOSE: 187 mg/dL
POTASSIUM: 4.1 mmol/L (ref 3.4–5.3)
SODIUM: 138 mmol/L (ref 137–147)

## 2015-09-19 LAB — LIPID PANEL
Cholesterol: 135 mg/dL (ref 0–200)
HDL: 50 mg/dL (ref 35–70)
LDL Cholesterol: 55 mg/dL
Triglycerides: 149 mg/dL (ref 40–160)

## 2015-09-19 LAB — HEPATIC FUNCTION PANEL
ALT: 15 U/L (ref 7–35)
AST: 26 U/L (ref 13–35)
Alkaline Phosphatase: 64 U/L (ref 25–125)
BILIRUBIN, TOTAL: 0.3 mg/dL

## 2015-09-19 LAB — HEMOGLOBIN A1C: Hemoglobin A1C: 6.4

## 2015-09-19 LAB — TSH: TSH: 0.96 u[IU]/mL (ref 0.41–5.90)

## 2015-09-19 NOTE — Progress Notes (Signed)
Patient ID: Rachel Cooke, female   DOB: 07-Mar-1919, 82 y.o.   MRN: [id number]  Location:   [facility]   Place of Service:  SNF (31) Provider:   Cindi Carbon, ANP [facility] [phone]   Hollace Kinnier, DO  Patient Care Team: [name], DO as PCP - General (Geriatric Medicine) [facility] Clarene Essex, MD as Attending Physician (Gastroenterology) Garvin Fila, MD as Consulting Physician (Neurology)  Extended Emergency Contact Information Primary Emergency Contact: [place] Address: [address]          [place],  [address] [name] of [place] Phone: [phone] Mobile Phone: [phone] Relation: Daughter   Code Status:  DNR Goals of care: Advanced Directive information Advanced Directives 08/23/2015  Does patient have an advance directive? Yes  Type of Advance Directive Out of facility DNR (pink MOST or yellow form);Healthcare Power of Attorney  Copy of advanced directive(s) in chart? Yes  Pre-existing out of facility DNR order (yellow form or pink MOST form) Yellow form placed in chart (order not valid for inpatient use)     Chief Complaint  Patient presents with  . Acute Visit    f/u fever, elevated WBC    HPI:  Pt is a 82 y.o. female seen today for an acute for follow up after being treated for leukocytosis, bronchitis vs aspiration, hemorrhoids, and chest discomfort. CXR was negative, Urine was negative but was taken after augmentin was started due to leukocytosis.  This was changed to vantin on 7/14 due to nausea and diarrhea. These symptoms resolved. She reports that she is swallowing well and has no more sore throat or chest discomfort. She was changed back to baby food but still is eating crackers in her room against our advice (due to hx of food impaction).  No further fevers, WBC trended down from 16 to 11.  BMP unremarkable. Hgb trended down from 11.3 in Jan to 10.6.    Past Medical History    Diagnosis Date  . Hypertension   . Dendritic keratitis 2012  . Malignant neoplasm of breast (female), unspecified site 1970    S/P Rt radical mastectomy  . Unspecified hypothyroidism 2009  . Type II or unspecified type diabetes mellitus without mention of complication, not stated as uncontrolled 2003  . Unspecified vitamin D deficiency 2009  . Pure hyperglyceridemia 2004  . Anxiety state, unspecified   . Macular degeneration (senile) of retina, unspecified 2012  . Unspecified glaucoma 2013  . Tinnitus 2007  . Coronary atherosclerosis of native coronary artery 2003    non obstructive by Cath 2003  . Atrioventricular block, complete ([place]) 2003    s/p PPM 2003, generator chnage 2011  . Allergic rhinitis due to pollen   . Acquired cyst of kidney 2002  . Osteoarthrosis, unspecified whether generalized or localized, unspecified site 2013  . Unspecified arthropathy, pelvic region and thigh 2010  . Senile osteoporosis 2003  . Abnormality of gait 2007  . Edema 2003  . Personal history of colonic polyps      s/p colonoscopy/polypectomy 2003  . Cardiac pacemaker in situ 2003  . CVA (cerebral infarction) 06/28/2012    rt hemiparesis, dysphagia  . NSTEMI (non-ST elevated myocardial infarction) ([place]) 06/28/2012  . Urinary retention 07/07/2012  . Pacemaker   . Stroke ([place]) 06/28/2012  . Food impaction of esophagus 08/15/2012  . Esophageal dysmotility 08/19/2012  . Acute sinus infection 11/25/2012  . Altered mental status 11/28/2012  . Dysuria   .  HTN (hypertension) 06/28/2012   Past Surgical History  Procedure Laterality Date  . Mastectomy Right 1970    Cancer  . Insert / replace / remove pacemaker    . Esophagogastroduodenoscopy N/A 08/15/2012    Procedure: ESOPHAGOGASTRODUODENOSCOPY (EGD);  Surgeon: [name], MD;  Location: [facility] ENDOSCOPY;  Service: Endoscopy;  Laterality: N/A;  . Esophagoscopy N/A 08/15/2012    Procedure: ESOPHAGOSCOPY;  Surgeon: [name], MD;  Location: [place];   Service: ENT;  Laterality: N/A;  . Foreign body removal esophageal N/A 08/15/2012    Procedure: REMOVAL FOREIGN BODY ESOPHAGEAL;  Surgeon: [name], MD;  Location: [facility] OR;  Service: ENT;  Laterality: N/A;  . Esophagogastroduodenoscopy N/A 08/15/2012    Procedure: ESOPHAGOGASTRODUODENOSCOPY (EGD);  Surgeon: [name], MD;  Location: [place];  Service: Endoscopy;  Laterality: N/A;  . Esophagogastroduodenoscopy N/A 10/03/2012    Procedure: ESOPHAGOGASTRODUODENOSCOPY (EGD);  Surgeon: [name], MD;  Location: [facility];  Service: Endoscopy;  Laterality: N/A;  . Savory dilation N/A 10/03/2012    Procedure: SAVORY DILATION;  Surgeon: [name], MD;  Location: [facility] ENDOSCOPY;  Service: Endoscopy;  Laterality: N/A;  . Botox injection N/A 10/03/2012    Procedure: BOTOX INJECTION;  Surgeon: [name], MD;  Location: [facility] ENDOSCOPY;  Service: Endoscopy;  Laterality: N/A;  . Esophagogastroduodenoscopy N/A 12/22/2012    Procedure: ESOPHAGOGASTRODUODENOSCOPY (EGD);  Surgeon: [name], MD;  Location: [facility] ENDOSCOPY;  Service: Endoscopy;  Laterality: N/A;    Allergies  Allergen Reactions  . Lactose Intolerance (Gi)       Medication List       This list is accurate as of: 09/19/15 12:13 PM.  Always use your most recent med list.               acetaminophen 650 MG CR tablet  Commonly known as:  TYLENOL  Take 650 mg by mouth 2 (two) times daily. For pain     BIOTENE DRY MOUTH CARE DT  Place 1 spray onto teeth 4 (four) times daily.     dextromethorphan-guaiFENesin 30-600 MG 12hr tablet  Commonly known as:  MUCINEX DM  Take 1 tablet by mouth 2 (two) times daily as needed for cough.     escitalopram 20 MG tablet  Commonly known as:  LEXAPRO  Take 20 mg by mouth daily. For depression/ anxiety.     GLUCERNA Liqd  Take 237 mLs by mouth. At breakfast and lunch     ipratropium-albuterol 0.5-2.5 (3) MG/3ML Soln  Commonly known as:  DUONEB  Take 3 mLs by nebulization every 6 (six) hours as  needed (cough/wheezing).     levothyroxine 75 MCG tablet  Commonly known as:  SYNTHROID, LEVOTHROID  Take 75 mcg by mouth daily before breakfast. For thyroid therapy     liver oil-zinc oxide 40 % ointment  Commonly known as:  DESITIN  Apply 1 application topically as needed for irritation.     loratadine 10 MG tablet  Commonly known as:  CLARITIN  Take 10 mg by mouth daily.     losartan 50 MG tablet  Commonly known as:  COZAAR  Take 50 mg by mouth daily. For HTN     Melatonin 5 MG Tabs  Take 5 tablets by mouth at bedtime.     metoprolol tartrate 25 MG tablet  Commonly known as:  LOPRESSOR  Take 25 mg by mouth 2 (two) times daily.     multivitamin tablet  Take 1 tablet by mouth  daily.     omeprazole 20 MG capsule  Commonly known as:  PRILOSEC  Take 20 mg by mouth daily. For GERD     PRESERVISION AREDS PO  Take by mouth daily.     trolamine salicylate 10 % cream  Commonly known as:  ASPERCREME  Apply 1 application topically 2 (two) times daily as needed for muscle pain.     VICKS VAPORUB 4.7-1.2-2.6 % Oint  Apply topically as needed (congestion).     Witch Hazel 14 % Liqd  Apply topically as needed (for hemorrhoid flare up).        Review of Systems  Constitutional: Negative for fever, chills, diaphoresis, activity change, appetite change, fatigue and unexpected weight change.  HENT: Positive for trouble swallowing. Negative for congestion, ear discharge, ear pain, rhinorrhea, sore throat and voice change.   Eyes: Negative for visual disturbance.  Respiratory: Negative for cough, shortness of breath and wheezing.   Cardiovascular: Negative for chest pain, palpitations and leg swelling.  Gastrointestinal: Negative for nausea, vomiting, abdominal pain, diarrhea, constipation, blood in stool, abdominal distention and rectal pain.  Genitourinary: Negative for dysuria and difficulty urinating.  Musculoskeletal: Positive for arthralgias and gait problem. Negative for  myalgias, back pain and joint swelling.  Skin: Negative for rash and wound.  Neurological: Negative for dizziness, tremors, seizures, syncope, facial asymmetry, speech difficulty, weakness, light-headedness, numbness and headaches.  Psychiatric/Behavioral: Negative for behavioral problems, confusion and agitation.       Mild memory loss    Immunization History  Administered Date(s) Administered  . Influenza Whole 12/17/2012  . Influenza-Unspecified 12/08/2013, 12/16/2014  . PPD Test 07/18/2012  . Pneumococcal Polysaccharide-23 08/27/2015   Pertinent  Health Maintenance Due  Topic Date Due  . FOOT EXAM  10/30/1931  . DEXA SCAN  10/30/1986  . OPHTHALMOLOGY EXAM  09/29/2015  . INFLUENZA VACCINE  10/04/2015  . HEMOGLOBIN A1C  12/15/2015  . PNA vac Low Risk Adult (2 of 2 - PCV13) 08/26/2016   Fall Risk  01/24/2015  Falls in the past year? No   Functional Status Survey:    Filed Vitals:   09/19/15 1214  BP: 144/74  Pulse: 84  Temp: 98.3 F (36.8 C)  Resp: 19  SpO2: 93%   There is no weight on file to calculate BMI. Physical Exam  Constitutional: She is oriented to person, place, and time. No distress.  HENT:  Head: Normocephalic and atraumatic.  Right Ear: External ear normal.  Left Ear: External ear normal.  Nose: Nose normal.  Mouth/Throat: Oropharynx is clear and moist. No oropharyngeal exudate.  Neck: No JVD present.  Cardiovascular: Normal rate and regular rhythm.   No murmur heard. Pulmonary/Chest: Effort normal and breath sounds normal. She has no wheezes.  Mild bibasilar crackles unchanged  Abdominal: Soft. Bowel sounds are normal. She exhibits no distension and no mass. There is no tenderness. There is no rebound and no guarding.  Musculoskeletal:  Right hemiplegia from CVA  Lymphadenopathy:    She has cervical adenopathy.  Neurological: She is alert and oriented to person, place, and time.  Skin: Skin is warm and dry. She is not diaphoretic.    Psychiatric: She has a normal mood and affect.  Nursing note and vitals reviewed.   Labs reviewed:  Recent Labs  01/25/15 09/19/15  NA 138 138  K 4.3 4.1  BUN 14 11  CREATININE 0.5 0.6    Recent Labs  01/25/15 09/19/15  AST 18 26  ALT 10 15  ALKPHOS  33 64    Recent Labs  04/01/15 09/19/15  WBC 8.1 11.7  HGB 11.3* 10.6*  HCT 35* 34*  PLT 198 282   Lab Results  Component Value Date   TSH 0.96 09/19/2015   Lab Results  Component Value Date   HGBA1C 6.4 09/19/2015   Lab Results  Component Value Date   CHOL 135 09/19/2015   HDL 50 09/19/2015   LDLCALC 55 09/19/2015   TRIG 149 09/19/2015   CHOLHDL 4.1 07/01/2012    Significant Diagnostic Results in last 30 days:  No results found.  Assessment/Plan  1) Leukocytosis -urine unremarkable, most likely due to aspiration/bronchitis -improved to 11.7 -complete vantin and monitor resident  2) Esophageal dysmotility -back on baby food but admits to having crackers in her room -discussed with her the danger of impaction and she agreed to avoid this -sore throat and chest discomfort has resolved  3) Acute bronchitis -continue vantin to complete 7 days -continue Duonebs as needed  4) Anemia -slow drift down,normocytic -has intermittent bloody stools, which has been fairly stable since she was taken off plavix -no work up due to age, would add iron if this continues  -has mildly inflamed hemorrhoids also and on hemorrhoid cream   Cindi Carbon, [place] [phone]

## 2015-10-04 DIAGNOSIS — E785 Hyperlipidemia, unspecified: Secondary | ICD-10-CM | POA: Diagnosis not present

## 2015-10-04 LAB — TSH: TSH: 0.76 u[IU]/mL (ref <=5.90)

## 2015-10-20 ENCOUNTER — Non-Acute Institutional Stay (SKILLED_NURSING_FACILITY): Payer: Medicare Other | Admitting: Adult Health

## 2015-10-20 DIAGNOSIS — I10 Essential (primary) hypertension: Secondary | ICD-10-CM

## 2015-10-20 DIAGNOSIS — I69351 Hemiplegia and hemiparesis following cerebral infarction affecting right dominant side: Secondary | ICD-10-CM | POA: Diagnosis not present

## 2015-10-20 DIAGNOSIS — D649 Anemia, unspecified: Secondary | ICD-10-CM | POA: Diagnosis not present

## 2015-10-20 DIAGNOSIS — F4321 Adjustment disorder with depressed mood: Secondary | ICD-10-CM | POA: Diagnosis not present

## 2015-10-20 DIAGNOSIS — I679 Cerebrovascular disease, unspecified: Secondary | ICD-10-CM

## 2015-10-20 DIAGNOSIS — I442 Atrioventricular block, complete: Secondary | ICD-10-CM

## 2015-10-20 DIAGNOSIS — K224 Dyskinesia of esophagus: Secondary | ICD-10-CM | POA: Diagnosis not present

## 2015-10-20 DIAGNOSIS — G4700 Insomnia, unspecified: Secondary | ICD-10-CM

## 2015-10-20 LAB — CBC AND DIFFERENTIAL
HCT: 37 % (ref 36–46)
Hemoglobin: 11.5 g/dL — AB (ref 12.0–16.0)
Platelets: 171 10*3/uL (ref 150–399)
WBC: 8.8 10*3/mL

## 2015-10-26 DIAGNOSIS — H353132 Nonexudative age-related macular degeneration, bilateral, intermediate dry stage: Secondary | ICD-10-CM | POA: Diagnosis not present

## 2015-10-26 DIAGNOSIS — H40013 Open angle with borderline findings, low risk, bilateral: Secondary | ICD-10-CM | POA: Diagnosis not present

## 2015-10-26 DIAGNOSIS — Z961 Presence of intraocular lens: Secondary | ICD-10-CM | POA: Diagnosis not present

## 2015-10-26 DIAGNOSIS — H5201 Hypermetropia, right eye: Secondary | ICD-10-CM | POA: Diagnosis not present

## 2015-11-02 ENCOUNTER — Ambulatory Visit (INDEPENDENT_AMBULATORY_CARE_PROVIDER_SITE_OTHER): Payer: Medicare Other | Admitting: Internal Medicine

## 2015-11-02 ENCOUNTER — Encounter: Payer: Self-pay | Admitting: Internal Medicine

## 2015-11-02 DIAGNOSIS — I442 Atrioventricular block, complete: Secondary | ICD-10-CM | POA: Diagnosis not present

## 2015-11-02 MED ORDER — LOSARTAN POTASSIUM 25 MG PO TABS: 25.0000 mg | ORAL_TABLET | Freq: Every day | ORAL | Status: DC

## 2015-11-02 NOTE — Patient Instructions (Addendum)
Medication Instructions: - ,Your physician has recommended you make the following change in your medication:  1) Decrease Cozaar (losartan) to 25 mg once daily  Labwork: - none  Procedures/Testing: - none  Follow-Up: - Remote monitoring is used to monitor your Pacemaker of ICD from home. This monitoring reduces the number of office visits required to check your device to one time per year. It allows [place] to keep an eye on the functioning of your device to ensure it is working properly. You are scheduled for a device check from home on 02/01/16. You may send your transmission at any time that day. If you have a wireless device, the transmission will be sent automatically. After your physician reviews your transmission, you will receive a postcard with your next transmission date.  - Your physician wants you to follow-up in: 1 year with Chanetta Marshall, NP for Dr. Caryl Comes. You will receive a reminder letter in the mail two months in advance. If you don't receive a letter, please call our office to schedule the follow-up appointment.  Any Additional Special Instructions Will Be Listed Below (If Applicable).     If you need a refill on your cardiac medications before your next appointment, please call your pharmacy.

## 2015-11-02 NOTE — Progress Notes (Signed)
Patient Care Team: [name], DO as PCP - General (Geriatric Medicine) [facility] Clarene Essex, MD as Attending Physician (Gastroenterology) Garvin Fila, MD as Consulting Physician (Neurology)   HPI  Rachel Cooke is a 82 y.o. female Seen to establish pacemaker followup. She underwent generator replacement in 2011 with a prior implantation in 2003  She has had intercurrent stroke which has left her nonambulatory. There is no associated atrial fibrillation detected on her device  There is no shortness of breath chest pain. She is nonambulatory.  Echo 2012 demonstrated normal LV function and mild aortic stenosis   Past Medical History:  Diagnosis Date  . Abnormality of gait 2007  . Acquired cyst of kidney 2002  . Acute sinus infection 11/25/2012  . Allergic rhinitis due to pollen   . Altered mental status 11/28/2012  . Anxiety state, unspecified   . Atrioventricular block, complete ([place]) 2003   s/p PPM 2003, generator chnage 2011  . Cardiac pacemaker in situ 2003  . Coronary atherosclerosis of native coronary artery 2003   non obstructive by Cath 2003  . CVA (cerebral infarction) 06/28/2012   rt hemiparesis, dysphagia  . Dendritic keratitis 2012  . Dysuria   . Edema 2003  . Esophageal dysmotility 08/19/2012  . Food impaction of esophagus 08/15/2012  . HTN (hypertension) 06/28/2012  . Hypertension   . Macular degeneration (senile) of retina, unspecified 2012  . Malignant neoplasm of breast (female), unspecified site 1970   S/P Rt radical mastectomy  . NSTEMI (non-ST elevated myocardial infarction) ([place]) 06/28/2012  . Osteoarthrosis, unspecified whether generalized or localized, unspecified site 2013  . Pacemaker   . Personal history of colonic polyps     s/p colonoscopy/polypectomy 2003  . Pure hyperglyceridemia 2004  . Senile osteoporosis 2003  . Stroke ([place]) 06/28/2012  . Tinnitus 2007  . Type II or unspecified type diabetes mellitus  without mention of complication, not stated as uncontrolled 2003  . Unspecified arthropathy, pelvic region and thigh 2010  . Unspecified glaucoma 2013  . Unspecified hypothyroidism 2009  . Unspecified vitamin D deficiency 2009  . Urinary retention 07/07/2012    Past Surgical History:  Procedure Laterality Date  . BOTOX INJECTION N/A 10/03/2012   Procedure: BOTOX INJECTION;  Surgeon: [name], MD;  Location: [facility] ENDOSCOPY;  Service: Endoscopy;  Laterality: N/A;  . ESOPHAGOGASTRODUODENOSCOPY N/A 08/15/2012   Procedure: ESOPHAGOGASTRODUODENOSCOPY (EGD);  Surgeon: [name], MD;  Location: [facility] ENDOSCOPY;  Service: Endoscopy;  Laterality: N/A;  . ESOPHAGOGASTRODUODENOSCOPY N/A 08/15/2012   Procedure: ESOPHAGOGASTRODUODENOSCOPY (EGD);  Surgeon: [name], MD;  Location: [facility];  Service: Endoscopy;  Laterality: N/A;  . ESOPHAGOGASTRODUODENOSCOPY N/A 10/03/2012   Procedure: ESOPHAGOGASTRODUODENOSCOPY (EGD);  Surgeon: [name], MD;  Location: [facility];  Service: Endoscopy;  Laterality: N/A;  . ESOPHAGOGASTRODUODENOSCOPY N/A 12/22/2012   Procedure: ESOPHAGOGASTRODUODENOSCOPY (EGD);  Surgeon: [name], MD;  Location: [facility] ENDOSCOPY;  Service: Endoscopy;  Laterality: N/A;  . ESOPHAGOSCOPY N/A 08/15/2012   Procedure: ESOPHAGOSCOPY;  Surgeon: [name], MD;  Location: [place];  Service: ENT;  Laterality: N/A;  . FOREIGN BODY REMOVAL ESOPHAGEAL N/A 08/15/2012   Procedure: REMOVAL FOREIGN BODY ESOPHAGEAL;  Surgeon: [name], MD;  Location: [place];  Service: ENT;  Laterality: N/A;  . INSERT / REPLACE / REMOVE PACEMAKER    . MASTECTOMY Right 1970   Cancer  . SAVORY DILATION N/A 10/03/2012   Procedure: SAVORY DILATION;  Surgeon: Caryl Bis  Magod, MD;  Location: [facility]OS[facility];  Service: Endoscopy;  Laterality: N/A;    Current Outpatient Prescriptions  Medication Sig Dispense Refill  . acetaminophen (TYLENOL) 650 MG CR tablet Take 650 mg by mouth 2 (two) times daily. For pain    .  Camphor-Eucalyptus-Menthol (VICKS VAPORUB) 4.7-1.2-2.6 % OINT Apply topically as needed (congestion).    . carboxymethylcellulose (REFRESH PLUS) 0.5 % SOLN 2 drops 2 (two) times daily as needed.    . Dentifrices (BIOTENE DRY MOUTH CARE DT) Place 1 spray onto teeth 4 (four) times daily.    Marland Kitchen dextromethorphan-guaiFENesin (MUCINEX DM) 30-600 MG 12hr tablet Take 1 tablet by mouth 2 (two) times daily as needed for cough.    . escitalopram (LEXAPRO) 20 MG tablet Take 20 mg by mouth daily. For depression/ anxiety.    Marland Kitchen esomeprazole (NEXIUM) 40 MG capsule Take 40 mg by mouth daily at 12 noon.    Marland Kitchen GLUCERNA (GLUCERNA) LIQD Take 237 mLs by mouth. At breakfast and lunch    . ipratropium-albuterol (DUONEB) 0.5-2.5 (3) MG/3ML SOLN Take 3 mLs by nebulization every 6 (six) hours as needed (cough/wheezing).    Marland Kitchen levothyroxine (SYNTHROID, LEVOTHROID) 75 MCG tablet Take 75 mcg by mouth daily before breakfast. For thyroid therapy    . liver oil-zinc oxide (DESITIN) 40 % ointment Apply 1 application topically as needed for irritation.    Marland Kitchen loratadine (CLARITIN) 10 MG tablet Take 10 mg by mouth daily.     Marland Kitchen LORazepam (ATIVAN) 0.5 MG tablet Take 0.5 mg by mouth every 8 (eight) hours as needed for anxiety.    Marland Kitchen losartan (COZAAR) 50 MG tablet Take 50 mg by mouth daily. For HTN    . Melatonin 5 MG TABS Take 5 tablets by mouth at bedtime.    . metoprolol tartrate (LOPRESSOR) 25 MG tablet Take 25 mg by mouth 2 (two) times daily.    . Multiple Vitamin (MULTIVITAMIN) tablet Take 1 tablet by mouth daily.    . Multiple Vitamins-Minerals (PRESERVISION AREDS PO) Take by mouth daily.    Marland Kitchen trolamine salicylate (ASPERCREME) 10 % cream Apply 1 application topically 2 (two) times daily as needed for muscle pain.    Addison Lank Hazel 14 % LIQD Apply topically as needed (for hemorrhoid flare up).     No current facility-administered medications for this visit.     Allergies  Allergen Reactions  . Augmentin [Amoxicillin-Pot  Clavulanate] Nausea And Vomiting  . Lactose Intolerance (Gi)     Review of Systems negative except from HPI and PMH  Physical Exam BP 102/64   Pulse 85   Ht 5\' 1"  (1.549 m)   SpO2 99%  Well developed and well nourished in no acute distress HENT normal E scleral and icterus clear Neck Supple JVP flat; carotids brisk and full Clear to ausculation Device pocket well healed; without hematoma or erythema.  There is no tethering  Regular rate and rhythm 2/6 systolic murmur   No clubbing cyanosis tr edema Edema Trace edema Alert and oriented, nonambulatory and wheelchair Skin Warm and Dry  ECG demonstrates P. synchronous pacing  Assessment and  Plan  Complete heart block  Pacemaker-Medtronic  Hypertension    Stroke  No intercurrent afib Euvolemic continue current meds   BP  102  Will decrease cozaar to 25 mg

## 2015-11-07 ENCOUNTER — Encounter: Payer: Self-pay | Admitting: Adult Health

## 2015-11-07 DIAGNOSIS — F4321 Adjustment disorder with depressed mood: Secondary | ICD-10-CM | POA: Insufficient documentation

## 2015-11-07 NOTE — Progress Notes (Signed)
Patient ID: Rachel Cooke, female   DOB: 06-Dec-1919, 82 y.o.   MRN: [id number]  Location:   [facility]   Place of Service:  SNF (31) Provider:   Cindi Carbon, ANP [facility] [phone]   Hollace Kinnier, DO  Patient Care Team: [name], DO as PCP - General (Geriatric Medicine) [facility] Clarene Essex, MD as Attending Physician (Gastroenterology) Garvin Fila, MD as Consulting Physician (Neurology)  Extended Emergency Contact Information Primary Emergency Contact: Hol[place] Address: [address]          [place], [place] [address] [name] of [place] Phone: [phone] Mobile Phone: [phone] Relation: Daughter   Code Status:  DNR Goals of care: Advanced Directive information Advanced Directives 11/07/2015  Does patient have an advance directive? Yes  Type of Advance Directive Out of facility DNR (pink MOST or yellow form);Healthcare Power of Attorney  Does patient want to make changes to advanced directive? -  Copy of advanced directive(s) in chart? Yes  Pre-existing out of facility DNR order (yellow form or pink MOST form) Yellow form placed in chart (order not valid for inpatient use)     Chief Complaint  Patient presents with  . Medical Management of Chronic Issues    HPI:  Pt is a 82 y.o. female seen today for medical management of chronic diseases. Resides in skilled care due to a hx of CVA with residual rt hemiparesis.   1. Esophageal dysmotility Hx of this as well as dysphagia from stroke. Has had food impaction before described as severe by Dr. Watt Climes. Baby food recommended. Her family signed waiver and she started having chocolate and gold fish. She became ill with a suspected aspiration injury and her diet was changed back to baby food. Later her family once again signed a waiver allowing her to have exceptions to the baby food due to her quality of life. There have been no issues with swallowing or  choking per the staff.  2. Cerebrovascular disease S/p CVA Off plavix due to heme pos stools LDL 55 on 09/19/15 BP controlled  3. Essential hypertension Controlled on cozaar and lopressor  4. Atrioventricular block, complete ([place]) S/p pace maker Due for a check this month  5. Hemiparesis affecting right side as late effect of cerebrovascular accident [facility]) See #2 Uses a hoyer lift to get out of bed and into the scooter  6. Insomnia Uses melatonin for sleep and states that this helps  7. Situational depression Intermittent depressed about being in skilled care, loss of independence, and living on baby food Denies SI/HI.  Currently on Lexapro 20 mg qd and ativan 0.5 mg  as needed   Past Medical History:  Diagnosis Date  . Abnormality of gait 2007  . Acquired cyst of kidney 2002  . Acute sinus infection 11/25/2012  . Allergic rhinitis due to pollen   . Altered mental status 11/28/2012  . Anxiety state, unspecified   . Atrioventricular block, complete ([place]) 2003   s/p PPM 2003, generator chnage 2011  . Cardiac pacemaker in situ 2003  . Coronary atherosclerosis of native coronary artery 2003   non obstructive by Cath 2003  . CVA (cerebral infarction) 06/28/2012   rt hemiparesis, dysphagia  . Dendritic keratitis 2012  . Dysuria   . Edema 2003  . Esophageal dysmotility 08/19/2012  . Food impaction of esophagus 08/15/2012  . HTN (hypertension) 06/28/2012  . Hypertension   . Macular degeneration (senile) of retina, unspecified 2012  .  Malignant neoplasm of breast (female), unspecified site 1970   S/P Rt radical mastectomy  . NSTEMI (non-ST elevated myocardial infarction) ([place]) 06/28/2012  . Osteoarthrosis, unspecified whether generalized or localized, unspecified site 2013  . Pacemaker   . Personal history of colonic polyps     s/p colonoscopy/polypectomy 2003  . Pure hyperglyceridemia 2004  . Senile osteoporosis 2003  . Stroke ([place]) 06/28/2012  . Tinnitus 2007  . Type II  or unspecified type diabetes mellitus without mention of complication, not stated as uncontrolled 2003  . Unspecified arthropathy, pelvic region and thigh 2010  . Unspecified glaucoma 2013  . Unspecified hypothyroidism 2009  . Unspecified vitamin D deficiency 2009  . Urinary retention 07/07/2012   Past Surgical History:  Procedure Laterality Date  . BOTOX INJECTION N/A 10/03/2012   Procedure: BOTOX INJECTION;  Surgeon: [name], MD;  Location: [facility];  Service: Endoscopy;  Laterality: N/A;  . ESOPHAGOGASTRODUODENOSCOPY N/A 08/15/2012   Procedure: ESOPHAGOGASTRODUODENOSCOPY (EGD);  Surgeon: [name], MD;  Location: [facility] ENDOSCOPY;  Service: Endoscopy;  Laterality: N/A;  . ESOPHAGOGASTRODUODENOSCOPY N/A 08/15/2012   Procedure: ESOPHAGOGASTRODUODENOSCOPY (EGD);  Surgeon: [name], MD;  Location: [place];  Service: Endoscopy;  Laterality: N/A;  . ESOPHAGOGASTRODUODENOSCOPY N/A 10/03/2012   Procedure: ESOPHAGOGASTRODUODENOSCOPY (EGD);  Surgeon: [name], MD;  [facility];  Service: Endoscopy;  Laterality: N/A;  . ESOPHAGOGASTRODUODENOSCOPY N/A 12/22/2012   Procedure: ESOPHAGOGASTRODUODENOSCOPY (EGD);  Surgeon: [name], MD;  Location: [facility] ENDOSCOPY;  Service: Endoscopy;  Laterality: N/A;  . ESOPHAGOSCOPY N/A 08/15/2012   Procedure: ESOPHAGOSCOPY;  Surgeon: [name], MD;  Location: [place];  Service: ENT;  Laterality: N/A;  . FOREIGN BODY REMOVAL ESOPHAGEAL N/A 08/15/2012   Procedure: REMOVAL FOREIGN BODY ESOPHAGEAL;  Surgeon: [name], MD;  Location: [place];  Service: ENT;  Laterality: N/A;  . INSERT / REPLACE / REMOVE PACEMAKER    . MASTECTOMY Right 1970   Cancer  . SAVORY DILATION N/A 10/03/2012   Procedure: SAVORY DILATION;  Surgeon: [name], MD;  [facility];  Service: Endoscopy;  Laterality: N/A;    Allergies  Allergen Reactions  . Augmentin [Amoxicillin-Pot Clavulanate] Nausea And Vomiting  . Lactose Intolerance (Gi)       Medication List         Accurate as of 10/20/15 11:59 PM. Always use your most recent med list.          acetaminophen 650 MG CR tablet Commonly known as:  TYLENOL Take 650 mg by mouth 2 (two) times daily. For pain   BIOTENE DRY MOUTH CARE DT Place 1 spray onto teeth 4 (four) times daily.   carboxymethylcellulose 0.5 % Soln Commonly known as:  REFRESH PLUS 2 drops 2 (two) times daily as needed.   dextromethorphan-guaiFENesin 30-600 MG 12hr tablet Commonly known as:  MUCINEX DM Take 1 tablet by mouth 2 (two) times daily as needed for cough.   escitalopram 20 MG tablet Commonly known as:  LEXAPRO Take 20 mg by mouth daily. For depression/ anxiety.   esomeprazole 40 MG capsule Commonly known as:  NEXIUM Take 40 mg by mouth daily at 12 noon.   GLUCERNA Liqd Take 237 mLs by mouth. At breakfast and lunch   ipratropium-albuterol 0.5-2.5 (3) MG/3ML Soln Commonly known as:  DUONEB Take 3 mLs by nebulization every 6 (six) hours as needed (cough/wheezing).   levothyroxine 75 MCG tablet Commonly known as:  SYNTHROID, LEVOTHROID Take 75 mcg by mouth daily before breakfast. For  thyroid therapy   liver oil-zinc oxide 40 % ointment Commonly known as:  DESITIN Apply 1 application topically as needed for irritation.   loratadine 10 MG tablet Commonly known as:  CLARITIN Take 10 mg by mouth daily.   losartan 50 MG tablet Commonly known as:  COZAAR Take 50 mg by mouth daily. For HTN   Melatonin 5 MG Tabs Take 5 tablets by mouth at bedtime.   metoprolol tartrate 25 MG tablet Commonly known as:  LOPRESSOR Take 25 mg by mouth 2 (two) times daily.   multivitamin tablet Take 1 tablet by mouth daily.   PRESERVISION AREDS PO Take by mouth daily.   trolamine salicylate 10 % cream Commonly known as:  ASPERCREME Apply 1 application topically 2 (two) times daily as needed for muscle pain.   VICKS VAPORUB 4.7-1.2-2.6 % Oint Apply topically as needed (congestion).   Witch Hazel 14 % Liqd Apply  topically as needed (for hemorrhoid flare up).       Review of Systems  Constitutional: Negative for activity change, appetite change, chills, diaphoresis, fatigue, fever and unexpected weight change.  HENT: Positive for trouble swallowing. Negative for congestion, ear discharge, ear pain, rhinorrhea, sore throat and voice change.   Eyes: Negative for visual disturbance.  Respiratory: Negative for cough, shortness of breath and wheezing.   Cardiovascular: Negative for chest pain, palpitations and leg swelling.  Gastrointestinal: Negative for abdominal distention, abdominal pain, blood in stool, constipation, diarrhea, nausea, rectal pain and vomiting.  Genitourinary: Negative for difficulty urinating and dysuria.  Musculoskeletal: Positive for arthralgias and gait problem. Negative for back pain, joint swelling and myalgias.  Skin: Negative for rash and wound.  Neurological: Negative for dizziness, tremors, seizures, syncope, facial asymmetry, speech difficulty, weakness, light-headedness, numbness and headaches.  Psychiatric/Behavioral: Negative for agitation, behavioral problems and confusion.       Mild memory loss    Immunization History  Administered Date(s) Administered  . Influenza Whole 12/17/2012  . Influenza-Unspecified 12/08/2013, 12/16/2014  . PPD Test 07/18/2012  . Pneumococcal Polysaccharide-23 08/27/2015   Pertinent  Health Maintenance Due  Topic Date Due  . FOOT EXAM  10/30/1931  . DEXA SCAN  10/30/1986  . OPHTHALMOLOGY EXAM  09/29/2015  . INFLUENZA VACCINE  10/04/2015  . HEMOGLOBIN A1C  03/21/2016  . PNA vac Low Risk Adult (2 of 2 - PCV13) 08/26/2016   Fall Risk  11/07/2015 01/24/2015  Falls in the past year? No No  Risk for fall due to : History of fall(s) -   Functional Status Survey:    Vitals:   10/20/15 1651  BP: 139/76  Pulse: 90  Resp: 17  Temp: 97.9 F (36.6 C)  SpO2: 93%  Weight: 159 lb 3.2 oz (72.2 kg)   Body mass index is 30.08  kg/m. Physical Exam  Constitutional: She is oriented to person, place, and time. No distress.  HENT:  Head: Normocephalic and atraumatic.  Right Ear: External ear normal.  Left Ear: External ear normal.  Nose: Nose normal.  Mouth/Throat: Oropharynx is clear and moist. No oropharyngeal exudate.  Neck: No JVD present.  Cardiovascular: Normal rate and regular rhythm.   No murmur heard. Pulmonary/Chest: Effort normal and breath sounds normal. She has no wheezes.  Mild bibasilar crackles unchanged  Abdominal: Soft. Bowel sounds are normal. She exhibits no distension and no mass. There is no tenderness. There is no rebound and no guarding.  Musculoskeletal:  Right hemiplegia from CVA  Lymphadenopathy:    She has no cervical adenopathy.  Neurological: She is alert and oriented to person, place, and time.  Skin: Skin is warm and dry. She is not diaphoretic.  Psychiatric: She has a normal mood and affect.  Nursing note and vitals reviewed.   Labs reviewed:  Recent Labs  01/25/15 09/19/15  NA 138 138  K 4.3 4.1  BUN 14 11  CREATININE 0.5 0.6    Recent Labs  01/25/15 09/19/15  AST 18 26  ALT 10 15  ALKPHOS 33 64    Recent Labs  04/01/15 09/19/15  WBC 8.1 11.7  HGB 11.3* 10.6*  HCT 35* 34*  PLT 198 282   Lab Results  Component Value Date   TSH 0.76 10/04/2015   Lab Results  Component Value Date   HGBA1C 6.4 09/19/2015   Lab Results  Component Value Date   CHOL 135 09/19/2015   HDL 50 09/19/2015   LDLCALC 55 09/19/2015   TRIG 149 09/19/2015   CHOLHDL 4.1 07/01/2012    Significant Diagnostic Results in last 30 days:  No results found.  Assessment/Plan  1. Esophageal dysmotility Recommend that she continue baby food only as her risk of food impaction is high. Her family is aware and have signed a waiver allowing her to have gold fish and chocolate daily. Her goals of care are comfort based and if another infection arises we will discuss whether we need to  treat with antibiotics.   2. Cerebrovascular disease LDL at goal, BP at goal Off plavix as above  3. Essential hypertension Controlled Continue current meds  4. Atrioventricular block, complete ([place]) S/p pacemaker, f/u with cards  5. Hemiparesis affecting right side as late effect of cerebrovascular accident [facility]) Staff to help with mobility and chair positioning  6. Insomnia Continue melatonin 5 mg qhs  7. Situational depression Seems in better spirits than last month Continue lexapro 20 mg qhs and ativan 0.5 as needed    Cindi Carbon, [place] [phone]

## 2015-12-02 ENCOUNTER — Encounter: Payer: Self-pay | Admitting: Adult Health

## 2015-12-02 ENCOUNTER — Non-Acute Institutional Stay (SKILLED_NURSING_FACILITY): Payer: Medicare Other | Admitting: Adult Health

## 2015-12-02 DIAGNOSIS — I69351 Hemiplegia and hemiparesis following cerebral infarction affecting right dominant side: Secondary | ICD-10-CM | POA: Diagnosis not present

## 2015-12-02 DIAGNOSIS — R195 Other fecal abnormalities: Secondary | ICD-10-CM | POA: Diagnosis not present

## 2015-12-02 DIAGNOSIS — D500 Iron deficiency anemia secondary to blood loss (chronic): Secondary | ICD-10-CM

## 2015-12-02 DIAGNOSIS — I679 Cerebrovascular disease, unspecified: Secondary | ICD-10-CM

## 2015-12-02 DIAGNOSIS — F5101 Primary insomnia: Secondary | ICD-10-CM

## 2015-12-02 DIAGNOSIS — J029 Acute pharyngitis, unspecified: Secondary | ICD-10-CM

## 2015-12-02 DIAGNOSIS — K224 Dyskinesia of esophagus: Secondary | ICD-10-CM

## 2015-12-02 NOTE — Progress Notes (Signed)
Patient ID: Rachel Cooke, female   DOB: [birth year], 82 y.o.   MRN: [id number]  Location:   [facility]   Place of Service:  SNF (31) Provider:   Cindi Carbon, ANP [facility] [phone]   Hollace Kinnier, DO  Patient Care Team: [name], DO as PCP - General (Geriatric Medicine) [facility] Clarene [place], MD as Attending Physician (Gastroenterology) Garvin Fila, MD as Consulting Physician (Neurology)  Extended Emergency Contact Information Primary Emergency Contact: [place] Address: [address]          [place], [place] [address] [name] of [place] Phone: [phone] Mobile Phone: [phone] Relation: Daughter   Code Status:  DNR Goals of care: Advanced Directive information Advanced Directives 11/07/2015  Does patient have an advance directive? Yes  Type of Advance Directive Out of facility DNR (pink MOST or yellow form);Healthcare Power of Attorney  Does patient want to make changes to advanced directive? -  Copy of advanced directive(s) in chart? Yes  Pre-existing out of facility DNR order (yellow form or pink MOST form) Yellow form placed in chart (order not valid for inpatient use)     Chief Complaint  Patient presents with  . Medical Management of Chronic Issues    HPI:  Pt is a 82 y.o. female seen today for medical management of chronic diseases. Resides in skilled care due to a hx of CVA with residual rt hemiparesis.   Hx of DM II, not currently on meds, Last A1C 6.4 on 09/19/15 Eye apt 10/26/15   MMSE 27/30 poor recall, passed clock, as vascular dementia from prior CVA  Esophageal dysmotility: Hx of this as well as dysphagia from stroke. Has had food impaction before described as severe by Dr. Watt Climes. Baby food recommended. Her family signed waiver and she started having chocolate and gold fish. Had an episode of aspiration this past summer, but stable now. No reports of cough, fever, choking   Hx  of heme pos stool in 2017, better off plavix. Last H/H 11.5/37 on 10/20/15. No reports of blood in stool or abd pain. No work up was done on this issue due to her age/debility/goals of care  Reports that she is sleeping well using Melatonin at night.     Past Medical History:  Diagnosis Date  . Abnormality of gait 2007  . Acquired cyst of kidney 2002  . Acute sinus infection 11/25/2012  . Allergic rhinitis due to pollen   . Altered mental status 11/28/2012  . Anxiety state, unspecified   . Atrioventricular block, complete ([place]) 2003   s/p PPM 2003, generator chnage 2011  . Cardiac pacemaker in situ 2003  . Coronary atherosclerosis of native coronary artery 2003   non obstructive by Cath 2003  . CVA (cerebral infarction) 06/28/2012   rt hemiparesis, dysphagia  . Dendritic keratitis 2012  . Dysuria   . Edema 2003  . Esophageal dysmotility 08/19/2012  . Food impaction of esophagus 08/15/2012  . HTN (hypertension) 06/28/2012  . Hypertension   . Macular degeneration (senile) of retina, unspecified 2012  . Malignant neoplasm of breast (female), unspecified site 1970   S/P Rt radical mastectomy  . NSTEMI (non-ST elevated myocardial infarction) ([place]) 06/28/2012  . Osteoarthrosis, unspecified whether generalized or localized, unspecified site 2013  . Pacemaker   . Personal history of colonic polyps     s/p colonoscopy/polypectomy 2003  . Pure hyperglyceridemia 2004  . Senile osteoporosis 2003  . Stroke ([place]) 06/28/2012  . Tinnitus  2007  . Type II or unspecified type diabetes mellitus without mention of complication, not stated as uncontrolled 2003  . Unspecified arthropathy, pelvic region and thigh 2010  . Unspecified glaucoma 2013  . Unspecified hypothyroidism 2009  . Unspecified vitamin D deficiency 2009  . Urinary retention 07/07/2012   Past Surgical History:  Procedure Laterality Date  . BOTOX INJECTION N/A 10/03/2012   Procedure: BOTOX INJECTION;  Surgeon: [name], MD;   Location: [facility];  Service: Endoscopy;  Laterality: N/A;  . ESOPHAGOGASTRODUODENOSCOPY N/A 08/15/2012   Procedure: ESOPHAGOGASTRODUODENOSCOPY (EGD);  Surgeon: [name], MD;  Location: [facility] ENDOSCOPY;  Service: Endoscopy;  Laterality: N/A;  . ESOPHAGOGASTRODUODENOSCOPY N/A 08/15/2012   Procedure: ESOPHAGOGASTRODUODENOSCOPY (EGD);  Surgeon: [name], MD;  Location: [facility];  Service: Endoscopy;  Laterality: N/A;  . ESOPHAGOGASTRODUODENOSCOPY N/A 10/03/2012   Procedure: ESOPHAGOGASTRODUODENOSCOPY (EGD);  Surgeon: [name], MD;  Location: [facility] ENDOSCOPY;  Service: Endoscopy;  Laterality: N/A;  . ESOPHAGOGASTRODUODENOSCOPY N/A 12/22/2012   Procedure: ESOPHAGOGASTRODUODENOSCOPY (EGD);  Surgeon: [name], MD;  Location: [facility] ENDOSCOPY;  Service: Endoscopy;  Laterality: N/A;  . ESOPHAGOSCOPY N/A 08/15/2012   Procedure: ESOPHAGOSCOPY;  Surgeon: [name], MD;  Location: [facility];  Service: ENT;  Laterality: N/A;  . FOREIGN BODY REMOVAL ESOPHAGEAL N/A 08/15/2012   Procedure: REMOVAL FOREIGN BODY ESOPHAGEAL;  Surgeon: [name], MD;  Location: [place];  Service: ENT;  Laterality: N/A;  . INSERT / REPLACE / REMOVE PACEMAKER    . MASTECTOMY Right 1970   Cancer  . SAVORY DILATION N/A 10/03/2012   Procedure: SAVORY DILATION;  Surgeon: [name], MD;  Location: [facility];  Service: Endoscopy;  Laterality: N/A;    Allergies  Allergen Reactions  . Augmentin [Amoxicillin-Pot Clavulanate] Nausea And Vomiting  . Lactose Intolerance (Gi)       Medication List       Accurate as of 12/02/15 12:11 PM. Always use your most recent med list.          acetaminophen 650 MG CR tablet Commonly known as:  TYLENOL Take 650 mg by mouth 2 (two) times daily. For pain   BIOTENE DRY MOUTH CARE DT Place 1 spray onto teeth 4 (four) times daily.   carboxymethylcellulose 0.5 % Soln Commonly known as:  REFRESH PLUS 2 drops 2 (two) times daily as needed.   dextromethorphan-guaiFENesin 30-600 MG 12hr  tablet Commonly known as:  MUCINEX DM Take 1 tablet by mouth 2 (two) times daily as needed for cough.   escitalopram 20 MG tablet Commonly known as:  LEXAPRO Take 20 mg by mouth daily. For depression/ anxiety.   GLUCERNA Liqd Take 237 mLs by mouth. At breakfast and lunch   ipratropium-albuterol 0.5-2.5 (3) MG/3ML Soln Commonly known as:  DUONEB Take 3 mLs by nebulization every 6 (six) hours as needed (cough/wheezing).   levothyroxine 75 MCG tablet Commonly known as:  SYNTHROID, LEVOTHROID Take 75 mcg by mouth daily before breakfast. For thyroid therapy   liver oil-zinc oxide 40 % ointment Commonly known as:  DESITIN Apply 1 application topically as needed for irritation.   loratadine 10 MG tablet Commonly known as:  CLARITIN Take 10 mg by mouth daily.   LORazepam 0.5 MG tablet Commonly known as:  ATIVAN Take 0.5 mg by mouth every 8 (eight) hours as needed for anxiety.   losartan 25 MG tablet Commonly known as:  COZAAR Take 1 tablet (25 mg total) by mouth daily.   Melatonin 5 MG Tabs Take  5 tablets by mouth at bedtime.   metoprolol tartrate 25 MG tablet Commonly known as:  LOPRESSOR Take 25 mg by mouth 2 (two) times daily.   multivitamin tablet Take 1 tablet by mouth daily.   omeprazole 20 MG capsule Commonly known as:  PRILOSEC Take 20 mg by mouth daily.   PRESERVISION AREDS PO Take by mouth daily.   trolamine salicylate 10 % cream Commonly known as:  ASPERCREME Apply 1 application topically 2 (two) times daily as needed for muscle pain.   VICKS VAPORUB 4.7-1.2-2.6 % Oint Apply topically as needed (congestion).   Witch Hazel 14 % Liqd Apply topically as needed (for hemorrhoid flare up).       Review of Systems  Constitutional: Negative for activity change, appetite change, chills, diaphoresis, fatigue, fever and unexpected weight change.  HENT: Positive for trouble swallowing. Negative for congestion, ear discharge, ear pain, rhinorrhea, sore throat  and voice change.   Eyes: Negative for visual disturbance.  Respiratory: Negative for cough, shortness of breath and wheezing.   Cardiovascular: Negative for chest pain, palpitations and leg swelling.  Gastrointestinal: Negative for abdominal distention, abdominal pain, blood in stool, constipation, diarrhea, nausea, rectal pain and vomiting.  Genitourinary: Negative for difficulty urinating and dysuria.  Musculoskeletal: Positive for arthralgias and gait problem. Negative for back pain, joint swelling and myalgias.  Skin: Negative for rash and wound.  Neurological: Negative for dizziness, tremors, seizures, syncope, facial asymmetry, speech difficulty, weakness, light-headedness, numbness and headaches.  Psychiatric/Behavioral: Negative for agitation, behavioral problems and confusion.       Mild memory loss    Immunization History  Administered Date(s) Administered  . Influenza Whole 12/17/2012  . Influenza-Unspecified 12/08/2013, 12/16/2014  . PPD Test 07/18/2012  . Pneumococcal Polysaccharide-23 08/27/2015   Pertinent  Health Maintenance Due  Topic Date Due  . FOOT EXAM  10/30/1931  . DEXA SCAN  10/30/1986  . OPHTHALMOLOGY EXAM  09/29/2015  . INFLUENZA VACCINE  10/04/2015  . HEMOGLOBIN A1C  03/21/2016  . PNA vac Low Risk Adult (2 of 2 - PCV13) 08/26/2016   Fall Risk  11/07/2015 01/24/2015  Falls in the past year? No No  Risk for fall due to : History of fall(s) -   Functional Status Survey:    Vitals:   12/02/15 1158  BP: 110/62  Pulse: 84  Resp: 20  Temp: 97.6 F (36.4 C)  SpO2: 96%  Weight: 162 lb 9.6 oz (73.8 kg)   Body mass index is 30.72 kg/m. Physical Exam  Constitutional: She is oriented to person, place, and time. No distress.  HENT:  Head: Normocephalic and atraumatic.  Right Ear: External ear normal.  Left Ear: External ear normal.  Nose: Nose normal.  Mouth/Throat: Oropharynx is clear and moist. No oropharyngeal exudate.  Neck: No JVD present.    Cardiovascular: Normal rate and regular rhythm.   No murmur heard. Pulmonary/Chest: Effort normal and breath sounds normal. She has no wheezes.  Mild bibasilar crackles unchanged  Abdominal: Soft. Bowel sounds are normal. She exhibits no distension and no mass. There is no tenderness. There is no rebound and no guarding.  Musculoskeletal:  Right hemiplegia from CVA  Lymphadenopathy:    She has no cervical adenopathy.  Neurological: She is alert and oriented to person, place, and time.  Skin: Skin is warm and dry. She is not diaphoretic.  Psychiatric: She has a normal mood and affect.  Nursing note and vitals reviewed.   Labs reviewed:  Recent Labs  01/25/15 09/19/15  NA 138 138  K 4.3 4.1  BUN 14 11  CREATININE 0.5 0.6    Recent Labs  01/25/15 09/19/15  AST 18 26  ALT 10 15  ALKPHOS 33 64    Recent Labs  04/01/15 09/19/15 10/20/15  WBC 8.1 11.7 8.8  HGB 11.3* 10.6* 11.5*  HCT 35* 34* 37  PLT 198 282 171   Lab Results  Component Value Date   TSH 0.76 10/04/2015   Lab Results  Component Value Date   HGBA1C 6.4 09/19/2015   Lab Results  Component Value Date   CHOL 135 09/19/2015   HDL 50 09/19/2015   LDLCALC 55 09/19/2015   TRIG 149 09/19/2015   CHOLHDL 4.1 07/01/2012    Significant Diagnostic Results in last 30 days:  No results found.  Assessment/Plan   Cerebrovascular disease Stable with no new events LDL 55 A1C 6.4 BP controlled No aggressive interventions per family  Allergic pharyngitis Intermittent runny nose, seasonal. Continue Claritin 10 mg qd  Insomnia Sleeps well Continue Melatonin 5 mg qhs  Anemia due to chronic blood loss H/H stable while on prilosec Avoidance of antiplatelet therapy No obvious blood in stool reported Goals of care are comfort based, no work up for hx of heme pos stool  Hemiparesis affecting right side as late effect of cerebrovascular accident Uses a scooter to move throughout the facility. Denies pain  to the right arm. Uses a hoyer lift to the chair.  Esophageal dysmotility Stable Continue baby food diet Has been advised not to deviate from this plan but has signed a waiver to allow for chocolate and goldfish    Cindi Carbon, ANP [facility] [phone]

## 2015-12-06 ENCOUNTER — Encounter: Payer: Self-pay | Admitting: Internal Medicine

## 2015-12-06 DIAGNOSIS — L84 Corns and callosities: Secondary | ICD-10-CM | POA: Diagnosis not present

## 2015-12-06 DIAGNOSIS — E1159 Type 2 diabetes mellitus with other circulatory complications: Secondary | ICD-10-CM | POA: Diagnosis not present

## 2015-12-06 DIAGNOSIS — L602 Onychogryphosis: Secondary | ICD-10-CM | POA: Diagnosis not present

## 2015-12-06 NOTE — Assessment & Plan Note (Signed)
Sleeps well Continue Melatonin 5 mg qhs

## 2015-12-06 NOTE — Assessment & Plan Note (Signed)
Stable with no new events LDL 55 A1C 6.4 BP controlled No aggressive interventions per family

## 2015-12-06 NOTE — Assessment & Plan Note (Signed)
Uses a scooter to move throughout the facility. Denies pain to the right arm. Uses a hoyer lift to the chair.

## 2015-12-06 NOTE — Assessment & Plan Note (Signed)
H/H stable while on prilosec Avoidance of antiplatelet therapy No obvious blood in stool reported Goals of care are comfort based, no work up for hx of heme pos stool

## 2015-12-06 NOTE — Assessment & Plan Note (Signed)
Intermittent runny nose, seasonal. Continue Claritin 10 mg qd

## 2015-12-06 NOTE — Assessment & Plan Note (Signed)
Stable Continue baby food diet Has been advised not to deviate from this plan but has signed a waiver to allow for chocolate and goldfish

## 2015-12-06 NOTE — Progress Notes (Deleted)
Patient ID: Rachel Cooke, female   DOB: Aug 24, 1919, 82 y.o.   MRN: [id number]  Location:  [facility] Room Number: 109 Place of Service:  SNF (31) Provider:  Hollace Kinnier, DO  Patient Care Team: [name], DO as PCP - General (Geriatric Medicine) [facility] Clarene [place], MD as Attending Physician (Gastroenterology) Garvin Fila, MD as Consulting Physician (Neurology)  Extended Emergency Contact Information Primary Emergency Contact: [place] Address: [address]          [place], [place] [address] [name] of [place] Phone: [phone] Mobile Phone: [phone] Relation: Daughter  Code Status:  *** Goals of care: Advanced Directive information Advanced Directives 12/06/2015  Does patient have an advance directive? -  Type of Advance Directive Out of facility DNR (pink MOST or yellow form);[place];Living will  Does patient want to make changes to advanced directive? -  Copy of advanced directive(s) in chart? Yes  Pre-existing out of facility DNR order (yellow form or pink MOST form) Yellow form placed in chart (order not valid for inpatient use)     Chief Complaint  Patient presents with  . Medical Management of Chronic Issues    Routine visit    HPI:  Pt is a 82 y.o. female seen today for medical management of chronic diseases.     Past Medical History:  Diagnosis Date  . Abnormality of gait 2007  . Acquired cyst of kidney 2002  . Acute sinus infection 11/25/2012  . Allergic rhinitis due to pollen   . Altered mental status 11/28/2012  . Anxiety state, unspecified   . Atrioventricular block, complete ([place]) 2003   s/p PPM 2003, generator chnage 2011  . Cardiac pacemaker in situ 2003  . Coronary atherosclerosis of native coronary artery 2003   non obstructive by Cath 2003  . CVA (cerebral infarction) 06/28/2012   rt hemiparesis, dysphagia  . Dendritic  keratitis 2012  . Dysuria   . Edema 2003  . Esophageal dysmotility 08/19/2012  . Food impaction of esophagus 08/15/2012  . HTN (hypertension) 06/28/2012  . Hypertension   . Macular degeneration (senile) of retina, unspecified 2012  . Malignant neoplasm of breast (female), unspecified site 1970   S/P Rt radical mastectomy  . NSTEMI (non-ST elevated myocardial infarction) ([place]) 06/28/2012  . Osteoarthrosis, unspecified whether generalized or localized, unspecified site 2013  . Pacemaker   . Personal history of colonic polyps     s/p colonoscopy/polypectomy 2003  . Pure hyperglyceridemia 2004  . Senile osteoporosis 2003  . Stroke ([place]) 06/28/2012  . Tinnitus 2007  . Type II or unspecified type diabetes mellitus without mention of complication, not stated as uncontrolled 2003  . Unspecified arthropathy, pelvic region and thigh 2010  . Unspecified glaucoma(365.9) 2013  . Unspecified hypothyroidism 2009  . Unspecified vitamin D deficiency 2009  . Urinary retention 07/07/2012   Past Surgical History:  Procedure Laterality Date  . BOTOX INJECTION N/A 10/03/2012   Procedure: BOTOX INJECTION;  Surgeon: [name], MD;  Location: [facility];  Service: Endoscopy;  Laterality: N/A;  . ESOPHAGOGASTRODUODENOSCOPY N/A 08/15/2012   Procedure: ESOPHAGOGASTRODUODENOSCOPY (EGD);  Surgeon: [name], MD;  Location: [facility] ENDOSCOPY;  Service: Endoscopy;  Laterality: N/A;  . ESOPHAGOGASTRODUODENOSCOPY N/A 08/15/2012   Procedure: ESOPHAGOGASTRODUODENOSCOPY (EGD);  Surgeon: [name], MD;  Location: [place];  Service: Endoscopy;  Laterality: N/A;  . ESOPHAGOGASTRODUODENOSCOPY N/A 10/03/2012   Procedure: ESOPHAGOGASTRODUODENOSCOPY (EGD);  Surgeon: Caryl Bis  Magod, MD;  [facility]: [facility];  Service: Endoscopy;  Laterality: N/A;  . ESOPHAGOGASTRODUODENOSCOPY N/A 12/22/2012   Procedure: ESOPHAGOGASTRODUODENOSCOPY (EGD);  Surgeon: [name], MD;  Location[facility] ENDOSCOPY;  Service: Endoscopy;  Laterality: N/A;    . ESOPHAGOSCOPY N/A 08/15/2012   Procedure: ESOPHAGOSCOPY;  Surgeon: [name], MD;  Location: [place];  Service: ENT;  Laterality: N/A;  . FOREIGN BODY REMOVAL ESOPHAGEAL N/A 08/15/2012   Procedure: REMOVAL FOREIGN BODY ESOPHAGEAL;  Surgeon: [name], MD;  Location: [place];  Service: ENT;  Laterality: N/A;  . INSERT / REPLACE / REMOVE PACEMAKER    . MASTECTOMY Right 1970   Cancer  . SAVORY DILATION N/A 10/03/2012   Procedure: SAVORY DILATION;  Surgeon: [name], MD;  Location: [facility];  Service: Endoscopy;  Laterality: N/A;    Allergies  Allergen Reactions  . Augmentin [Amoxicillin-Pot Clavulanate] Nausea And Vomiting  . Lactose Intolerance (Gi)       Medication List       Accurate as of 12/06/15 11:53 AM. Always use your most recent med list.          acetaminophen 650 MG CR tablet Commonly known as:  TYLENOL Take 650 mg by mouth 2 (two) times daily. For pain   BIOTENE DRY MOUTH CARE DT Place 1 spray onto teeth 4 (four) times daily.   carboxymethylcellulose 0.5 % Soln Commonly known as:  REFRESH PLUS 2 drops 2 (two) times daily as needed.   dextromethorphan-guaiFENesin 30-600 MG 12hr tablet Commonly known as:  MUCINEX DM Take 1 tablet by mouth 2 (two) times daily as needed for cough.   escitalopram 20 MG tablet Commonly known as:  LEXAPRO Take 20 mg by mouth daily. For depression/ anxiety.   GLUCERNA Liqd Take 237 mLs by mouth. At breakfast and lunch   ipratropium-albuterol 0.5-2.5 (3) MG/3ML Soln Commonly known as:  DUONEB Take 3 mLs by nebulization every 6 (six) hours as needed (cough/wheezing).   levothyroxine 75 MCG tablet Commonly known as:  SYNTHROID, LEVOTHROID Take 75 mcg by mouth daily before breakfast. For thyroid therapy   liver oil-zinc oxide 40 % ointment Commonly known as:  DESITIN Apply 1 application topically as needed for irritation.   loratadine 10 MG tablet Commonly known as:  CLARITIN Take 10 mg by mouth daily.   LORazepam  0.5 MG tablet Commonly known as:  ATIVAN Take 0.5 mg by mouth every 8 (eight) hours as needed for anxiety.   losartan 25 MG tablet Commonly known as:  COZAAR Take 1 tablet (25 mg total) by mouth daily.   Melatonin 5 MG Tabs Take 5 tablets by mouth at bedtime.   metoprolol tartrate 25 MG tablet Commonly known as:  LOPRESSOR Take 25 mg by mouth 2 (two) times daily.   multivitamin tablet Take 1 tablet by mouth daily.   omeprazole 20 MG capsule Commonly known as:  PRILOSEC Take 20 mg by mouth daily.   PRESERVISION AREDS PO Take by mouth daily.   trolamine salicylate 10 % cream Commonly known as:  ASPERCREME Apply 1 application topically 2 (two) times daily as needed for muscle pain.   VICKS VAPORUB 4.7-1.2-2.6 % Oint Apply topically as needed (congestion).   Witch Hazel 14 % Liqd Apply topically as needed (for hemorrhoid flare up).       Review of Systems  Immunization History  Administered Date(s) Administered  . Influenza Whole 12/17/2012  . Influenza-Unspecified 12/08/2013, 12/16/2014  . PPD Test 07/18/2012  . Pneumococcal Polysaccharide-23 08/27/2015  Pertinent  Health Maintenance Due  Topic Date Due  . FOOT EXAM  10/30/1931  . DEXA SCAN  10/30/1986  . OPHTHALMOLOGY EXAM  09/29/2015  . INFLUENZA VACCINE  10/04/2015  . HEMOGLOBIN A1C  03/21/2016  . PNA vac Low Risk Adult (2 of 2 - PCV13) 08/26/2016   Fall Risk  11/07/2015 01/24/2015  Falls in the past year? No No  Risk for fall due to : History of fall(s) -   Functional Status Survey:    Vitals:   12/06/15 1145  BP: 110/62  Pulse: 84  Resp: 13  Temp: 97.6 F (36.4 C)  TempSrc: Oral  SpO2: 96%   There is no height or weight on file to calculate BMI. Physical Exam  Labs reviewed:  Recent Labs  01/25/15 09/19/15  NA 138 138  K 4.3 4.1  BUN 14 11  CREATININE 0.5 0.6    Recent Labs  01/25/15 09/19/15  AST 18 26  ALT 10 15  ALKPHOS 33 64    Recent Labs  04/01/15 09/19/15 10/20/15   WBC 8.1 11.7 8.8  HGB 11.3* 10.6* 11.5*  HCT 35* 34* 37  PLT 198 282 171   Lab Results  Component Value Date   TSH 0.76 10/04/2015   Lab Results  Component Value Date   HGBA1C 6.4 09/19/2015   Lab Results  Component Value Date   CHOL 135 09/19/2015   HDL 50 09/19/2015   LDLCALC 55 09/19/2015   TRIG 149 09/19/2015   CHOLHDL 4.1 07/01/2012    Significant Diagnostic Results in last 30 days:  No results found.  Assessment/Plan There are no diagnoses linked to this encounter.   Family/ staff Communication: ***  Labs/tests ordered:  ***

## 2015-12-06 NOTE — Progress Notes (Signed)
This encounter was created in error - please disregard.

## 2016-01-03 ENCOUNTER — Non-Acute Institutional Stay (SKILLED_NURSING_FACILITY): Payer: Medicare Other | Admitting: Internal Medicine

## 2016-01-03 ENCOUNTER — Encounter: Payer: Self-pay | Admitting: Internal Medicine

## 2016-01-03 DIAGNOSIS — I69351 Hemiplegia and hemiparesis following cerebral infarction affecting right dominant side: Secondary | ICD-10-CM

## 2016-01-03 DIAGNOSIS — K224 Dyskinesia of esophagus: Secondary | ICD-10-CM | POA: Diagnosis not present

## 2016-01-03 DIAGNOSIS — E038 Other specified hypothyroidism: Secondary | ICD-10-CM

## 2016-01-03 DIAGNOSIS — F5101 Primary insomnia: Secondary | ICD-10-CM

## 2016-01-03 DIAGNOSIS — E118 Type 2 diabetes mellitus with unspecified complications: Secondary | ICD-10-CM

## 2016-01-03 DIAGNOSIS — D500 Iron deficiency anemia secondary to blood loss (chronic): Secondary | ICD-10-CM | POA: Diagnosis not present

## 2016-01-03 NOTE — Progress Notes (Signed)
Patient ID: Rachel Cooke, female   DOB: [birth year], 82 y.o.   MRN: [id number]  Location:  [place] Room Number: 109 Place of Service:  SNF (31) Provider:  Ellasyn Swilling L. Mariea Clonts, D.O., C.M.D.  Hollace Kinnier, DO  Patient Care Team: [name], DO as PCP - General (Geriatric Medicine) [facility] Clarene Essex, MD as Attending Physician (Gastroenterology) Garvin Fila, MD as Consulting Physician (Neurology)  Extended Emergency Contact Information Primary Emergency Contact: Butler Address: [address]          [place], [place] [address] [name] of [place] Phone: [phone] Mobile Phone: [phone] Relation: Daughter  Code Status:  DNR Goals of care: Advanced Directive information Advanced Directives 01/03/2016  Does patient have an advance directive? Yes  Type of Advance Directive Out of facility DNR (pink MOST or yellow form);[place];Living will  Does patient want to make changes to advanced directive? -  Copy of advanced directive(s) in chart? Yes  Pre-existing out of facility DNR order (yellow form or pink MOST form) Yellow form placed in chart (order not valid for inpatient use)     Chief Complaint  Patient presents with  . Medical Management of Chronic Issues    routine visit    HPI:  Pt is a 82 y.o. female seen today for medical management of chronic diseases.    Resides in SNF due to a hx of CVA with residual rt hemiparesis. Uses wheelchair to get around.    DM II, not currently on meds, Last A1C 6.4 on 09/19/15 Eye apt was 10/26/15.  Foot exam was done.    MMSE 27/30 poor recall, passed clock, has mild vascular dementia from prior CVA  Esophageal dysmotility: Hx of this as well as dysphagia from stroke. Has had food impaction before described as severe by Dr. Watt Climes. Baby food recommended. Her family signed waiver and she started having chocolate and gold fish.  Had an episode of aspiration this past summer, but stable now. No reports of cough, fever, choking.  Each visit we have, she requests to be able to eat regular food and we review what happened to her when she did that before.  I've discussed it with her daughter and they all agree the baby food must continue.    Hx of heme pos stool in 2017, better off plavix. Last H/H 11.5/37 on 10/20/15. No reports of blood in stool or abd pain. No work up was done on this issue due to her age/debility/goals of care  Continues to sleep well with melatonin.  Has no other complaints today.     Past Medical History:  Diagnosis Date  . Abnormality of gait 2007  . Acquired cyst of kidney 2002  . Acute sinus infection 11/25/2012  . Allergic rhinitis due to pollen   . Altered mental status 11/28/2012  . Anxiety state, unspecified   . Atrioventricular block, complete ([place]) 2003   s/p PPM 2003, generator chnage 2011  . Cardiac pacemaker in situ 2003  . Coronary atherosclerosis of native coronary artery 2003   non obstructive by Cath 2003  . CVA (cerebral infarction) 06/28/2012   rt hemiparesis, dysphagia  . Dendritic keratitis 2012  . Dysuria   . Edema 2003  . Esophageal dysmotility 08/19/2012  . Food impaction of esophagus 08/15/2012  . HTN (hypertension) 06/28/2012  . Hypertension   . Macular degeneration (senile) of retina, unspecified 2012  . Malignant neoplasm of breast (female), unspecified site  1970   S/P Rt radical mastectomy  . NSTEMI (non-ST elevated myocardial infarction) ([place]) 06/28/2012  . Osteoarthrosis, unspecified whether generalized or localized, unspecified site 2013  . Pacemaker   . Personal history of colonic polyps     s/p colonoscopy/polypectomy 2003  . Pure hyperglyceridemia 2004  . Senile osteoporosis 2003  . Stroke ([place]) 06/28/2012  . Tinnitus 2007  . Type II or unspecified type diabetes mellitus without mention of complication, not stated as uncontrolled 2003  . Unspecified  arthropathy, pelvic region and thigh 2010  . Unspecified glaucoma(365.9) 2013  . Unspecified hypothyroidism 2009  . Unspecified vitamin D deficiency 2009  . Urinary retention 07/07/2012   Past Surgical History:  Procedure Laterality Date  . BOTOX INJECTION N/A 10/03/2012   Procedure: BOTOX INJECTION;  Surgeon: [name], MD;  Location: [facility] END[facility]COPY;  Service: Endoscopy;  Laterality: N/A;  . ESOPHAGOGASTRODUODENOSCOPY N/A 08/15/2012   Procedure: ESOPHAGOGASTRODUODENOSCOPY (EGD);  Surgeon: [name], MD;  Location: [facility] ENDOSCOPY;  Service: Endoscopy;  Laterality: N/A;  . ESOPHAGOGASTRODUODENOSCOPY N/A 08/15/2012   Procedure: ESOPHAGOGASTRODUODENOSCOPY (EGD);  Surgeon: [name], MD;  Location: [place];  Service: Endoscopy;  Laterality: N/A;  . ESOPHAGOGASTRODUODENOSCOPY N/A 10/03/2012   Procedure: ESOPHAGOGASTRODUODENOSCOPY (EGD);  Surgeon: [name], MD;  Location: [facility];  Service: Endoscopy;  Laterality: N/A;  . ESOPHAGOGASTRODUODENOSCOPY N/A 12/22/2012   Procedure: ESOPHAGOGASTRODUODENOSCOPY (EGD);  Surgeon: [name], MD;  Location: [facility] ENDOSCOPY;  Service: Endoscopy;  Laterality: N/A;  . ESOPHAGOSCOPY N/A 08/15/2012   Procedure: ESOPHAGOSCOPY;  Surgeon: [name], MD;  Location: [place];  Service: ENT;  Laterality: N/A;  . FOREIGN BODY REMOVAL ESOPHAGEAL N/A 08/15/2012   Procedure: REMOVAL FOREIGN BODY ESOPHAGEAL;  Surgeon: [name], MD;  Location: [place];  Service: ENT;  Laterality: N/A;  . INSERT / REPLACE / REMOVE PACEMAKER    . MASTECTOMY Right 1970   Cancer  . SAVORY DILATION N/A 10/03/2012   Procedure: SAVORY DILATION;  Surgeon: [name], MD;  [facility]OS[facility];  Service: Endoscopy;  Laterality: N/A;    Allergies  Allergen Reactions  . Augmentin [Amoxicillin-Pot Clavulanate] Nausea And Vomiting  . Lactose Intolerance (Gi)       Medication List       Accurate as of 01/03/16  1:52 PM. Always use your most recent med list.          acetaminophen 650  MG CR tablet Commonly known as:  TYLENOL Take 650 mg by mouth 2 (two) times daily. For pain   BIOTENE DRY MOUTH CARE DT Place 1 spray onto teeth 4 (four) times daily.   carboxymethylcellulose 0.5 % Soln Commonly known as:  REFRESH PLUS 2 drops 2 (two) times daily as needed.   dextromethorphan-guaiFENesin 30-600 MG 12hr tablet Commonly known as:  MUCINEX DM Take 1 tablet by mouth 2 (two) times daily as needed for cough.   escitalopram 20 MG tablet Commonly known as:  LEXAPRO Take 20 mg by mouth daily. For depression/ anxiety.   GLUCERNA Liqd Take 237 mLs by mouth. At breakfast and lunch   ipratropium-albuterol 0.5-2.5 (3) MG/3ML Soln Commonly known as:  DUONEB Take 3 mLs by nebulization every 6 (six) hours as needed (cough/wheezing).   levothyroxine 75 MCG tablet Commonly known as:  SYNTHROID, LEVOTHROID Take 75 mcg by mouth daily before breakfast. For thyroid therapy   liver oil-zinc oxide 40 % ointment Commonly known as:  DESITIN Apply 1 application topically as needed for irritation.   loratadine 10  MG tablet Commonly known as:  CLARITIN Take 10 mg by mouth daily.   LORazepam 0.5 MG tablet Commonly known as:  ATIVAN Take 0.5 mg by mouth every 8 (eight) hours as needed for anxiety.   losartan 25 MG tablet Commonly known as:  COZAAR Take 1 tablet (25 mg total) by mouth daily.   Melatonin 5 MG Tabs Take 5 tablets by mouth at bedtime.   metoprolol tartrate 25 MG tablet Commonly known as:  LOPRESSOR Take 25 mg by mouth 2 (two) times daily.   multivitamin tablet Take 1 tablet by mouth daily.   omeprazole 20 MG capsule Commonly known as:  PRILOSEC Take 20 mg by mouth daily.   PRESERVISION AREDS PO Take by mouth daily.   trolamine salicylate 10 % cream Commonly known as:  ASPERCREME Apply 1 application topically 2 (two) times daily as needed for muscle pain.   Witch Hazel 14 % Liqd Apply topically as needed (for hemorrhoid flare up).       Review of  Systems  Constitutional: Negative for activity change, appetite change, chills and fever.  HENT: Negative for congestion.   Eyes: Negative for visual disturbance.  Respiratory: Negative for chest tightness and shortness of breath.   Cardiovascular: Positive for leg swelling.       Compression hose  Gastrointestinal: Negative for abdominal distention.  Genitourinary: Negative for dysuria.  Musculoskeletal: Positive for gait problem.       Uses wheelchair due to stroke  Skin: Negative for color change.  Neurological: Positive for weakness. Negative for dizziness.  Psychiatric/Behavioral: Positive for confusion. Negative for agitation and sleep disturbance.    Immunization History  Administered Date(s) Administered  . Influenza Whole 12/17/2012  . Influenza-Unspecified 12/08/2013, 12/16/2014  . PPD Test 07/18/2012  . Pneumococcal Polysaccharide-23 08/27/2015   Pertinent  Health Maintenance Due  Topic Date Due  . FOOT EXAM  10/30/1931  . DEXA SCAN  10/30/1986  . OPHTHALMOLOGY EXAM  09/29/2015  . INFLUENZA VACCINE  06/02/2016 (Originally 10/04/2015)  . HEMOGLOBIN A1C  03/21/2016  . PNA vac Low Risk Adult (2 of 2 - PCV13) 08/26/2016   Fall Risk  11/07/2015 01/24/2015  Falls in the past year? No No  Risk for fall due to : History of fall(s) -   Functional Status Survey:    Vitals:   01/03/16 1351  BP: 130/74  Pulse: 86  Resp: 16  Temp: 97.7 F (36.5 C)  TempSrc: Oral  SpO2: 97%   There is no height or weight on file to calculate BMI. Physical Exam  Constitutional: She is oriented to person, place, and time. She appears well-developed and well-nourished.  Cardiovascular: Normal rate and regular rhythm.   Pulmonary/Chest: Effort normal and breath sounds normal.  Abdominal: Soft. Bowel sounds are normal.  Musculoskeletal:  Right hemiparesis, uses wheelchair  Neurological: She is alert and oriented to person, place, and time.  Skin: Skin is warm and dry.  Psychiatric: She  has a normal mood and affect.    Labs reviewed:  Recent Labs  01/25/15 09/19/15  NA 138 138  K 4.3 4.1  BUN 14 11  CREATININE 0.5 0.6    Recent Labs  01/25/15 09/19/15  AST 18 26  ALT 10 15  ALKPHOS 33 64    Recent Labs  04/01/15 09/19/15 10/20/15  WBC 8.1 11.7 8.8  HGB 11.3* 10.6* 11.5*  HCT 35* 34* 37  PLT 198 282 171   Lab Results  Component Value Date   TSH 0.76  10/04/2015   Lab Results  Component Value Date   HGBA1C 6.4 09/19/2015   Lab Results  Component Value Date   CHOL 135 09/19/2015   HDL 50 09/19/2015   LDLCALC 55 09/19/2015   TRIG 149 09/19/2015   CHOLHDL 4.1 07/01/2012    Assessment/Plan 1. Controlled type 2 diabetes mellitus with complication, without long-term current use of insulin (HCC) -controlled with diet only -foot exam and eye exam done -cont to monitor  2. Hemiparesis affecting right side as late effect of cerebrovascular accident [facility]) -uses wheelchair to get around since  3. Anemia due to chronic blood loss -stable lately, is off plavix long term now due to recurrence of the GI bleeding  4. Esophageal dysmotility -continues baby food diet except chocolate and goldfish per waiver signed by her family -has had impactions before so too dangerous to eat other foods  5. Other specified hypothyroidism -cont levothyroxine 80mcg daily, monitor TSH at least once a year--was normal in august  6. Primary insomnia -doing better with use of melatonin  Family/ staff Communication: discussed with snf nursing  Labs/tests ordered:  No new

## 2016-01-10 ENCOUNTER — Telehealth: Payer: Self-pay | Admitting: *Deleted

## 2016-01-10 NOTE — Telephone Encounter (Signed)
Daughter, [name] called and stated that an order needs to be signed for patient's Mastectomy Rachel Cooke from [facility] [place]. Instructed her that we have not received this order but the order needs to be sent to [facility] due to patient's being in Skilled. She agreed and will have them fax it there.

## 2016-01-16 ENCOUNTER — Non-Acute Institutional Stay (SKILLED_NURSING_FACILITY): Payer: Medicare Other | Admitting: Adult Health

## 2016-01-16 DIAGNOSIS — J309 Allergic rhinitis, unspecified: Secondary | ICD-10-CM | POA: Diagnosis not present

## 2016-01-16 DIAGNOSIS — B9789 Other viral agents as the cause of diseases classified elsewhere: Secondary | ICD-10-CM | POA: Diagnosis not present

## 2016-01-16 DIAGNOSIS — J069 Acute upper respiratory infection, unspecified: Secondary | ICD-10-CM

## 2016-01-16 NOTE — Progress Notes (Signed)
Patient ID: Rachel Cooke, female   DOB: Jul 22, 1919, 82 y.o.   MRN: [id number]  Location:     [facility] Place of Service:  SNF (31) Provider:   Cindi Carbon, ANP [facility] [phone]  Hollace Kinnier, DO  Patient Care Team: [name], DO as PCP - General (Geriatric Medicine) [facility] Clarene Essex, MD as Attending Physician (Gastroenterology) Garvin Fila, MD as Consulting Physician (Neurology)  Extended Emergency Contact Information Primary Emergency Contact: [place] Address: [address]          [place], [place] [address] [name] of [place] Phone: [phone] Mobile Phone: [phone] Relation: Daughter  Code Status:  DNR Goals of care: Advanced Directive information Advanced Directives 01/03/2016  Does patient have an advance directive? Yes  Type of Advance Directive Out of facility DNR (pink MOST or yellow form);[place];Living will  Does patient want to make changes to advanced directive? -  Copy of advanced directive(s) in chart? Yes  Pre-existing out of facility DNR order (yellow form or pink MOST form) Yellow form placed in chart (order not valid for inpatient use)     Chief Complaint  Patient presents with  . Acute Visit    sinus pain, cough, headache    HPI:  Pt is a 82 y.o. female seen today for an acute visit for c/o cough, frontal headache, rhinorrhea, watery eyes. Symptoms x 3 days. She denies productive cough, sore throat or body aches. She has been afrebrile. She states she has a headache. Rates pain 2/10. Denies eye drainage, itchiness or irritation to eyes. Denies chest pain and shortness of breath.    Past Medical History:  Diagnosis Date  . Abnormality of gait 2007  . Acquired cyst of kidney 2002  . Acute sinus infection 11/25/2012  . Allergic rhinitis due to pollen   . Altered mental status 11/28/2012  . Anxiety state, unspecified   . Atrioventricular block,  complete ([place]) 2003   s/p PPM 2003, generator chnage 2011  . Cardiac pacemaker in situ 2003  . Coronary atherosclerosis of native coronary artery 2003   non obstructive by Cath 2003  . CVA (cerebral infarction) 06/28/2012   rt hemiparesis, dysphagia  . Dendritic keratitis 2012  . Dysuria   . Edema 2003  . Esophageal dysmotility 08/19/2012  . Food impaction of esophagus 08/15/2012  . HTN (hypertension) 06/28/2012  . Hypertension   . Macular degeneration (senile) of retina, unspecified 2012  . Malignant neoplasm of breast (female), unspecified site 1970   S/P Rt radical mastectomy  . NSTEMI (non-ST elevated myocardial infarction) ([place]) 06/28/2012  . Osteoarthrosis, unspecified whether generalized or localized, unspecified site 2013  . Pacemaker   . Personal history of colonic polyps     s/p colonoscopy/polypectomy 2003  . Pure hyperglyceridemia 2004  . Senile osteoporosis 2003  . Stroke ([place]) 06/28/2012  . Tinnitus 2007  . Type II or unspecified type diabetes mellitus without mention of complication, not stated as uncontrolled 2003  . Unspecified arthropathy, pelvic region and thigh 2010  . Unspecified glaucoma(365.9) 2013  . Unspecified hypothyroidism 2009  . Unspecified vitamin D deficiency 2009  . Urinary retention 07/07/2012   Past Surgical History:  Procedure Laterality Date  . BOTOX INJECTION N/A 10/03/2012   Procedure: BOTOX INJECTION;  Surgeon: [name], MD;  Location: [facility];  Service: Endoscopy;  Laterality: N/A;  . ESOPHAGOGASTRODUODENOSCOPY N/A 08/15/2012   Procedure: ESOPHAGOGASTRODUODENOSCOPY (EGD);  Surgeon: [name], MD;  Location: [facility] ENDOSCOPY;  Service: Endoscopy;  Laterality: N/A;  . ESOPHAGOGASTRODUODENOSCOPY N/A 08/15/2012   Procedure: ESOPHAGOGASTRODUODENOSCOPY (EGD);  Surgeon: [name], MD;  Location: [place];  Service: Endoscopy;  Laterality: N/A;  . ESOPHAGOGASTRODUODENOSCOPY N/A 10/03/2012   Procedure: ESOPHAGOGASTRODUODENOSCOPY (EGD);  Surgeon:  [name], MD;  Location: [facility];  Service: Endoscopy;  Laterality: N/A;  . ESOPHAGOGASTRODUODENOSCOPY N/A 12/22/2012   Procedure: ESOPHAGOGASTRODUODENOSCOPY (EGD);  Surgeon: [name], MD;  Location: [facility] ENDOSCOPY;  Service: Endoscopy;  Laterality: N/A;  . ESOPHAGOSCOPY N/A 08/15/2012   Procedure: ESOPHAGOSCOPY;  Surgeon: [name], MD;  Location: [place];  Service: ENT;  Laterality: N/A;  . FOREIGN BODY REMOVAL ESOPHAGEAL N/A 08/15/2012   Procedure: REMOVAL FOREIGN BODY ESOPHAGEAL;  Surgeon: [name], MD;  Location: [place];  Service: ENT;  Laterality: N/A;  . INSERT / REPLACE / REMOVE PACEMAKER    . MASTECTOMY Right 1970   Cancer  . SAVORY DILATION N/A 10/03/2012   Procedure: SAVORY DILATION;  Surgeon: [name], MD;  [facility];  Service: Endoscopy;  Laterality: N/A;    Allergies  Allergen Reactions  . Augmentin [Amoxicillin-Pot Clavulanate] Nausea And Vomiting  . Lactose Intolerance (Gi)       Medication List       Accurate as of 01/16/16 10:51 AM. Always use your most recent med list.          acetaminophen 650 MG CR tablet Commonly known as:  TYLENOL Take 650 mg by mouth 2 (two) times daily. For pain   BIOTENE DRY MOUTH CARE DT Place 1 spray onto teeth 4 (four) times daily.   carboxymethylcellulose 0.5 % Soln Commonly known as:  REFRESH PLUS 2 drops 2 (two) times daily as needed.   escitalopram 20 MG tablet Commonly known as:  LEXAPRO Take 20 mg by mouth daily. For depression/ anxiety.   GLUCERNA Liqd Take 237 mLs by mouth. At breakfast and lunch   ipratropium-albuterol 0.5-2.5 (3) MG/3ML Soln Commonly known as:  DUONEB Take 3 mLs by nebulization every 6 (six) hours as needed (cough/wheezing).   levothyroxine 75 MCG tablet Commonly known as:  SYNTHROID, LEVOTHROID Take 75 mcg by mouth daily before breakfast. For thyroid therapy   liver oil-zinc oxide 40 % ointment Commonly known as:  DESITIN Apply 1 application topically as needed  for irritation.   loratadine 10 MG tablet Commonly known as:  CLARITIN Take 10 mg by mouth daily.   LORazepam 0.5 MG tablet Commonly known as:  ATIVAN Take 0.5 mg by mouth every 8 (eight) hours as needed for anxiety.   losartan 25 MG tablet Commonly known as:  COZAAR Take 1 tablet (25 mg total) by mouth daily.   Melatonin 5 MG Tabs Take 5 tablets by mouth at bedtime.   metoprolol tartrate 25 MG tablet Commonly known as:  LOPRESSOR Take 25 mg by mouth 2 (two) times daily.   multivitamin tablet Take 1 tablet by mouth daily.   omeprazole 20 MG capsule Commonly known as:  PRILOSEC Take 20 mg by mouth daily.   PRESERVISION AREDS PO Take by mouth daily.   trolamine salicylate 10 % cream Commonly known as:  ASPERCREME Apply 1 application topically 2 (two) times daily as needed for muscle pain.   Witch Hazel 14 % Liqd Apply topically as needed (for hemorrhoid flare up).       Review of Systems  Constitutional: Positive for fatigue. Negative for activity change, appetite change, chills, diaphoresis, fever and unexpected weight  change.  HENT: Positive for congestion, rhinorrhea and trouble swallowing. Negative for ear discharge, ear pain, facial swelling, postnasal drip, sinus pressure, sore throat and voice change.   Eyes: Negative for pain, discharge, redness and itching.  Respiratory: Positive for cough. Negative for chest tightness, shortness of breath and wheezing.   Cardiovascular: Negative for chest pain.  Genitourinary: Negative for difficulty urinating and dysuria.  Musculoskeletal: Positive for gait problem. Negative for arthralgias, back pain, joint swelling, myalgias, neck pain and neck stiffness.  Neurological: Positive for headaches. Negative for dizziness, facial asymmetry and light-headedness. Weakness: right sided hemiparesis.  Psychiatric/Behavioral: Negative for behavioral problems and sleep disturbance.    Immunization History  Administered Date(s)  Administered  . Influenza Whole 12/17/2012  . Influenza-Unspecified 12/08/2013, 12/16/2014  . PPD Test 07/18/2012  . Pneumococcal Polysaccharide-23 08/27/2015   Pertinent  Health Maintenance Due  Topic Date Due  . FOOT EXAM  10/30/1931  . DEXA SCAN  10/30/1986  . OPHTHALMOLOGY EXAM  09/29/2015  . INFLUENZA VACCINE  06/02/2016 (Originally 10/04/2015)  . HEMOGLOBIN A1C  03/21/2016  . PNA vac Low Risk Adult (2 of 2 - PCV13) 08/26/2016   Fall Risk  11/07/2015 01/24/2015  Falls in the past year? No No  Risk for fall due to : History of fall(s) -   Functional Status Survey:    Vitals:   01/16/16 0957  BP: 131/76  Pulse: 79  Resp: 18  Temp: 98.1 F (36.7 C)  SpO2: 98%   There is no height or weight on file to calculate BMI. Physical Exam  Constitutional: She is oriented to person, place, and time. She appears well-developed and well-nourished. No distress.  HENT:  Head: Normocephalic and atraumatic.  Right Ear: External ear normal.  Left Ear: External ear normal.  Nose: Nose normal.  Mouth/Throat: No oropharyngeal exudate.  Sinus cavities tender to palpate. Clear drainage in  nose  Eyes: Conjunctivae are normal. Pupils are equal, round, and reactive to light. Right eye exhibits no discharge. Left eye exhibits no discharge.  Neck: Normal range of motion. Neck supple. No JVD present.  Neck is tender upon palpation  Cardiovascular: Normal rate, regular rhythm, normal heart sounds and intact distal pulses.  Exam reveals no friction rub.   No murmur heard. No edema  Pulmonary/Chest: Effort normal and breath sounds normal. No respiratory distress. She has no wheezes.  Chronic mild crackles to lower lobes  Abdominal: Soft. Bowel sounds are normal. She exhibits no distension. There is no tenderness.  Lymphadenopathy:    She has no cervical adenopathy.  Neurological: She is alert and oriented to person, place, and time.  Skin: Skin is warm and dry. No rash noted. She is not  diaphoretic. No erythema.  Psychiatric: She has a normal mood and affect.  Nursing note and vitals reviewed.   Labs reviewed:  Recent Labs  01/25/15 09/19/15  NA 138 138  K 4.3 4.1  BUN 14 11  CREATININE 0.5 0.6    Recent Labs  01/25/15 09/19/15  AST 18 26  ALT 10 15  ALKPHOS 33 64    Recent Labs  04/01/15 09/19/15 10/20/15  WBC 8.1 11.7 8.8  HGB 11.3* 10.6* 11.5*  HCT 35* 34* 37  PLT 198 282 171   Lab Results  Component Value Date   TSH 0.76 10/04/2015   Lab Results  Component Value Date   HGBA1C 6.4 09/19/2015   Lab Results  Component Value Date   CHOL 135 09/19/2015   HDL 50 09/19/2015  LDLCALC 55 09/19/2015   TRIG 149 09/19/2015   CHOLHDL 4.1 07/01/2012    Significant Diagnostic Results in last 30 days:  No results found.  Assessment/Plan 1. Viral URI -Non-productive cough, afebrile. No need for CXR at this time, will continue to monitor for worsening -Start Robitussin 10 ml tid x 5 days -Encourage drinking water  2. Allergic rhinitis, unspecified chronicity, unspecified seasonality, unspecified trigger -Continue Claritin -Continue Tylenol for headaches, fever   Family/ staff Communication: discussed with staff/resident  Labs/tests ordered:  NA

## 2016-02-01 ENCOUNTER — Encounter: Payer: Medicare Other | Admitting: *Deleted

## 2016-02-01 ENCOUNTER — Telehealth: Payer: Self-pay | Admitting: Cardiology

## 2016-02-01 NOTE — Telephone Encounter (Signed)
LMOVM reminding pt to send remote transmission.   

## 2016-02-02 ENCOUNTER — Telehealth: Payer: Self-pay | Admitting: Internal Medicine

## 2016-02-02 NOTE — Telephone Encounter (Signed)
Spoke w/ pt son in law he is going to re attempt the transmission on Friday 11-31-17. I will call pt son in law when we receive the transmission.

## 2016-02-02 NOTE — Telephone Encounter (Signed)
Pt's son n law calling regarding getting transmission to go through-never has a problem before, transmitting from [place]. Wanted to know if he can re-send the info or does he need to re-do it??

## 2016-02-06 ENCOUNTER — Ambulatory Visit (INDEPENDENT_AMBULATORY_CARE_PROVIDER_SITE_OTHER): Payer: Medicare Other | Admitting: *Deleted

## 2016-02-06 DIAGNOSIS — I442 Atrioventricular block, complete: Secondary | ICD-10-CM | POA: Diagnosis not present

## 2016-02-06 NOTE — Telephone Encounter (Signed)
Spoke w/ pt son in law and informed him that pt remote transmission was received.

## 2016-02-06 NOTE — Progress Notes (Signed)
Remote pacemaker transmission.   

## 2016-02-08 DIAGNOSIS — R2689 Other abnormalities of gait and mobility: Secondary | ICD-10-CM | POA: Diagnosis not present

## 2016-02-08 DIAGNOSIS — F339 Major depressive disorder, recurrent, unspecified: Secondary | ICD-10-CM | POA: Diagnosis not present

## 2016-02-08 DIAGNOSIS — R278 Other lack of coordination: Secondary | ICD-10-CM | POA: Diagnosis not present

## 2016-02-08 DIAGNOSIS — I69951 Hemiplegia and hemiparesis following unspecified cerebrovascular disease affecting right dominant side: Secondary | ICD-10-CM | POA: Diagnosis not present

## 2016-02-09 DIAGNOSIS — F339 Major depressive disorder, recurrent, unspecified: Secondary | ICD-10-CM | POA: Diagnosis not present

## 2016-02-09 DIAGNOSIS — R2689 Other abnormalities of gait and mobility: Secondary | ICD-10-CM | POA: Diagnosis not present

## 2016-02-09 DIAGNOSIS — R278 Other lack of coordination: Secondary | ICD-10-CM | POA: Diagnosis not present

## 2016-02-09 DIAGNOSIS — I69951 Hemiplegia and hemiparesis following unspecified cerebrovascular disease affecting right dominant side: Secondary | ICD-10-CM | POA: Diagnosis not present

## 2016-02-10 DIAGNOSIS — R278 Other lack of coordination: Secondary | ICD-10-CM | POA: Diagnosis not present

## 2016-02-10 DIAGNOSIS — F339 Major depressive disorder, recurrent, unspecified: Secondary | ICD-10-CM | POA: Diagnosis not present

## 2016-02-10 DIAGNOSIS — R2689 Other abnormalities of gait and mobility: Secondary | ICD-10-CM | POA: Diagnosis not present

## 2016-02-10 DIAGNOSIS — I69951 Hemiplegia and hemiparesis following unspecified cerebrovascular disease affecting right dominant side: Secondary | ICD-10-CM | POA: Diagnosis not present

## 2016-02-10 LAB — CUP PACEART REMOTE DEVICE CHECK
Battery Impedance: 586 Ohm
Battery Voltage: 2.78 V
Brady Statistic AP VS Percent: 0 %
Brady Statistic AS VP Percent: 100 %
Date Time Interrogation Session: 20171202152125
Implantable Lead Location: 753859
Implantable Lead Model: 5076
Implantable Lead Model: 5076
Lead Channel Pacing Threshold Amplitude: 0.75 V
Lead Channel Pacing Threshold Pulse Width: 0.4 ms
Lead Channel Setting Pacing Amplitude: 2.5 V
Lead Channel Setting Pacing Pulse Width: 0.4 ms
MDC IDC LEAD IMPLANT DT: 20110506
MDC IDC LEAD IMPLANT DT: 20110506
MDC IDC LEAD LOCATION: 753860
MDC IDC MSMT BATTERY REMAINING LONGEVITY: 74 mo
MDC IDC MSMT LEADCHNL RA IMPEDANCE VALUE: 467 Ohm
MDC IDC MSMT LEADCHNL RV IMPEDANCE VALUE: 459 Ohm
MDC IDC MSMT LEADCHNL RV PACING THRESHOLD AMPLITUDE: 1.125 V
MDC IDC MSMT LEADCHNL RV PACING THRESHOLD PULSEWIDTH: 0.4 ms
MDC IDC PG IMPLANT DT: 20110506
MDC IDC SET LEADCHNL RA PACING AMPLITUDE: 2 V
MDC IDC SET LEADCHNL RV SENSING SENSITIVITY: 2.8 mV
MDC IDC STAT BRADY AP VP PERCENT: 0 %
MDC IDC STAT BRADY AS VS PERCENT: 0 %

## 2016-02-15 ENCOUNTER — Encounter: Payer: Self-pay | Admitting: Cardiology

## 2016-02-15 DIAGNOSIS — R2689 Other abnormalities of gait and mobility: Secondary | ICD-10-CM | POA: Diagnosis not present

## 2016-02-15 DIAGNOSIS — R278 Other lack of coordination: Secondary | ICD-10-CM | POA: Diagnosis not present

## 2016-02-15 DIAGNOSIS — F339 Major depressive disorder, recurrent, unspecified: Secondary | ICD-10-CM | POA: Diagnosis not present

## 2016-02-15 DIAGNOSIS — I69951 Hemiplegia and hemiparesis following unspecified cerebrovascular disease affecting right dominant side: Secondary | ICD-10-CM | POA: Diagnosis not present

## 2016-02-17 ENCOUNTER — Non-Acute Institutional Stay (SKILLED_NURSING_FACILITY): Payer: Medicare Other | Admitting: Adult Health

## 2016-02-17 ENCOUNTER — Encounter: Payer: Self-pay | Admitting: Adult Health

## 2016-02-17 DIAGNOSIS — I872 Venous insufficiency (chronic) (peripheral): Secondary | ICD-10-CM | POA: Diagnosis not present

## 2016-02-17 DIAGNOSIS — I10 Essential (primary) hypertension: Secondary | ICD-10-CM | POA: Diagnosis not present

## 2016-02-17 DIAGNOSIS — I69351 Hemiplegia and hemiparesis following cerebral infarction affecting right dominant side: Secondary | ICD-10-CM

## 2016-02-17 DIAGNOSIS — D500 Iron deficiency anemia secondary to blood loss (chronic): Secondary | ICD-10-CM | POA: Diagnosis not present

## 2016-02-17 DIAGNOSIS — E038 Other specified hypothyroidism: Secondary | ICD-10-CM

## 2016-02-17 DIAGNOSIS — K224 Dyskinesia of esophagus: Secondary | ICD-10-CM | POA: Diagnosis not present

## 2016-02-17 DIAGNOSIS — I442 Atrioventricular block, complete: Secondary | ICD-10-CM

## 2016-02-17 DIAGNOSIS — F4321 Adjustment disorder with depressed mood: Secondary | ICD-10-CM | POA: Diagnosis not present

## 2016-02-17 NOTE — Progress Notes (Signed)
Patient ID: Rachel Cooke, female   DOB: [birth year], 82 y.o.   MRN: [id number]  Location:  [place]:  SNF (31) Provider:  Cindi Carbon, [place] [phone]  REED, Jonelle Sidle, DO  Patient Care Team: [name], DO as PCP - General (Geriatric Medicine) [facility] Clarene Essex, MD as Attending Physician (Gastroenterology) Garvin Fila, MD as Consulting Physician (Neurology)  Extended Emergency Contact Information Primary Emergency Contact: [place] Address: [address]          [place], [place] [address] [name] of [place] Phone: [phone] Mobile Phone: [phone] Relation: Daughter  Code Status:  DNR Goals of care: Advanced Directive information Advanced Directives 02/17/2016  Does Patient Have a Medical Advance Directive? Yes  Type of Advance Directive Out of facility DNR (pink MOST or yellow form);[place];Living will  Does patient want to make changes to medical advance directive? -  Copy of [place] in Chart? Yes  Pre-existing out of facility DNR order (yellow form or pink MOST form) Yellow form placed in chart (order not valid for inpatient use)     Chief Complaint  Patient presents with  . Medical Management of Chronic Issues    HPI:  Pt is a 82 y.o. female seen today for medical management of chronic diseases. She resides in skilled care due to a hx of CVA with residual right sided weakness and vascular dementia. Hx of dysphagia and esophageal motility currently consuming baby food without difficulty but does have a waiver to eat goldfish and chocolate. No recent bouts pna.   Has gained 8 lbs and has increased edema in her legs as she has been refusing to wear her compression hose.  No sob or cp.  2d echo below 02/15/14 Normal LV size with mild LV hypertrophy. EF 55-60%. Mildly dilated RV with normal systolic  function. The aortic valve was calcified and there appeared to be at least moderate stenosis visually but measured mean gradient was only 9 mmHg. Would consider TEE to assess more closely. Mild pulmonary hypertension. Grade 1 DD  Hx of heme pos stool and anemia, improved off plavix CBC Latest Ref Rng & Units 10/20/2015 09/19/2015 04/01/2015  WBC 10:3/mL 8.8 11.7 8.1  Hemoglobin 12.0 - 16.0 g/dL 11.5(A) 10.6(A) 11.3(A)  Hematocrit 36 - 46 % 37 34(A) 35(A)  Platelets 150 - 399 K/L 171 282 198   Hx of agitation and anxiety/depression due to loss of independence, improved spirits during our visit, currently on lexapro 20 mg qd  Past Medical History:  Diagnosis Date  . Abnormality of gait 2007  . Acquired cyst of kidney 2002  . Acute sinus infection 11/25/2012  . Allergic rhinitis due to pollen   . Altered mental status 11/28/2012  . Anxiety state, unspecified   . Atrioventricular block, complete ([place]) 2003   s/p PPM 2003, generator chnage 2011  . Cardiac pacemaker in situ 2003  . Coronary atherosclerosis of native coronary artery 2003   non obstructive by Cath 2003  . CVA (cerebral infarction) 06/28/2012   rt hemiparesis, dysphagia  . Dendritic keratitis 2012  . Dysuria   . Edema 2003  . Esophageal dysmotility 08/19/2012  . Food impaction of esophagus 08/15/2012  . HTN (hypertension) 06/28/2012  . Hypertension   . Macular degeneration (senile) of retina, unspecified 2012  . Malignant neoplasm of breast (female), unspecified site 1970   S/P Rt radical mastectomy  .  NSTEMI (non-ST elevated myocardial infarction) ([place]) 06/28/2012  . Osteoarthrosis, unspecified whether generalized or localized, unspecified site 2013  . Pacemaker   . Personal history of colonic polyps     s/p colonoscopy/polypectomy 2003  . Pure hyperglyceridemia 2004  . Senile osteoporosis 2003  . Stroke ([place]) 06/28/2012  . Tinnitus 2007  . Type II or unspecified type diabetes mellitus without mention of  complication, not stated as uncontrolled 2003  . Unspecified arthropathy, pelvic region and thigh 2010  . Unspecified glaucoma(365.9) 2013  . Unspecified hypothyroidism 2009  . Unspecified vitamin D deficiency 2009  . Urinary retention 07/07/2012   Past Surgical History:  Procedure Laterality Date  . BOTOX INJECTION N/A 10/03/2012   Procedure: BOTOX INJECTION;  Surgeon: [name], MD;  Location: [facility] ENDOSCOPY;  Service: Endoscopy;  Laterality: N/A;  . ESOPHAGOGASTRODUODENOSCOPY N/A 08/15/2012   Procedure: ESOPHAGOGASTRODUODENOSCOPY (EGD);  Surgeon: [name], MD;  Location: [facility] ENDOSCOPY;  Service: Endoscopy;  Laterality: N/A;  . ESOPHAGOGASTRODUODENOSCOPY N/A 08/15/2012   Procedure: ESOPHAGOGASTRODUODENOSCOPY (EGD);  Surgeon: [name], MD;  Location: [place];  Service: Endoscopy;  Laterality: N/A;  . ESOPHAGOGASTRODUODENOSCOPY N/A 10/03/2012   Procedure: ESOPHAGOGASTRODUODENOSCOPY (EGD);  Surgeon: [name], MD;  Location: [facility];  Service: Endoscopy;  Laterality: N/A;  . ESOPHAGOGASTRODUODENOSCOPY N/A 12/22/2012   Procedure: ESOPHAGOGASTRODUODENOSCOPY (EGD);  Surgeon: [name], MD;  Location: [facility] ENDOSCOPY;  Service: Endoscopy;  Laterality: N/A;  . ESOPHAGOSCOPY N/A 08/15/2012   Procedure: ESOPHAGOSCOPY;  Surgeon: [name], MD;  Location: [place];  Service: ENT;  Laterality: N/A;  . FOREIGN BODY REMOVAL ESOPHAGEAL N/A 08/15/2012   Procedure: REMOVAL FOREIGN BODY ESOPHAGEAL;  Surgeon: [name], MD;  Location: [place];  Service: ENT;  Laterality: N/A;  . INSERT / REPLACE / REMOVE PACEMAKER    . MASTECTOMY Right 1970   Cancer  . SAVORY DILATION N/A 10/03/2012   Procedure: SAVORY DILATION;  Surgeon: [name], MD;  [facility] ENDOS[facility];  Service: Endoscopy;  Laterality: N/A;    Allergies  Allergen Reactions  . Augmentin [Amoxicillin-Pot Clavulanate] Nausea And Vomiting  . Lactose Intolerance (Gi)     Allergies as of 02/17/2016      Reactions   Augmentin  [amoxicillin-pot Clavulanate] Nausea And Vomiting   Lactose Intolerance (gi)       Medication List       Accurate as of 02/17/16 12:13 PM. Always use your most recent med list.          acetaminophen 650 MG CR tablet Commonly known as:  TYLENOL Take 650 mg by mouth 2 (two) times daily. For pain   BIOTENE DRY MOUTH CARE DT Place 1 spray onto teeth 4 (four) times daily.   carboxymethylcellulose 0.5 % Soln Commonly known as:  REFRESH PLUS 2 drops 2 (two) times daily as needed.   escitalopram 20 MG tablet Commonly known as:  LEXAPRO Take 20 mg by mouth daily. For depression/ anxiety.   GLUCERNA Liqd Take 237 mLs by mouth. At breakfast and lunch   ipratropium-albuterol 0.5-2.5 (3) MG/3ML Soln Commonly known as:  DUONEB Take 3 mLs by nebulization every 6 (six) hours as needed (cough/wheezing).   levothyroxine 75 MCG tablet Commonly known as:  SYNTHROID, LEVOTHROID Take 75 mcg by mouth daily before breakfast. For thyroid therapy   liver oil-zinc oxide 40 % ointment Commonly known as:  DESITIN Apply 1 application topically as needed for irritation.   loratadine 10 MG tablet Commonly known as:  CLARITIN Take 10  mg by mouth daily.   LORazepam 0.5 MG tablet Commonly known as:  ATIVAN Take 0.5 mg by mouth every 8 (eight) hours as needed for anxiety.   losartan 25 MG tablet Commonly known as:  COZAAR Take 1 tablet (25 mg total) by mouth daily.   Melatonin 5 MG Tabs Take 5 tablets by mouth at bedtime.   memantine 10 MG tablet Commonly known as:  NAMENDA Take 10 mg by mouth 2 (two) times daily.   metoprolol tartrate 25 MG tablet Commonly known as:  LOPRESSOR Take 25 mg by mouth 2 (two) times daily.   multivitamin tablet Take 1 tablet by mouth daily.   omeprazole 20 MG capsule Commonly known as:  PRILOSEC Take 20 mg by mouth daily.   PRESERVISION AREDS PO Take by mouth daily.   trolamine salicylate 10 % cream Commonly known as:  ASPERCREME Apply 1  application topically 2 (two) times daily as needed for muscle pain.   Witch Hazel 14 % Liqd Apply topically as needed (for hemorrhoid flare up).       Review of Systems  Constitutional: Positive for unexpected weight change. Negative for activity change, appetite change, chills, diaphoresis, fatigue and fever.  HENT: Negative for congestion.   Respiratory: Negative for cough, shortness of breath, wheezing and stridor.   Cardiovascular: Positive for leg swelling. Negative for chest pain and palpitations.  Gastrointestinal: Negative for abdominal distention, constipation and diarrhea.  Musculoskeletal: Positive for arthralgias and gait problem.  Skin: Negative for color change, pallor, rash and wound.  Neurological: Positive for weakness. Negative for dizziness, facial asymmetry and headaches.  Psychiatric/Behavioral: Positive for confusion. Negative for agitation and behavioral problems.    Immunization History  Administered Date(s) Administered  . Influenza Inj Mdck Quad Pf 12/22/2015  . Influenza Whole 12/17/2012  . Influenza-Unspecified 12/08/2013, 12/16/2014  . PPD Test 07/18/2012  . Pneumococcal Polysaccharide-23 08/27/2015   Pertinent  Health Maintenance Due  Topic Date Due  . FOOT EXAM  10/30/1931  . URINE MICROALBUMIN  10/30/1931  . DEXA SCAN  10/30/1986  . OPHTHALMOLOGY EXAM  09/29/2015  . HEMOGLOBIN A1C  03/21/2016  . PNA vac Low Risk Adult (2 of 2 - PCV13) 08/26/2016  . INFLUENZA VACCINE  Completed   Fall Risk  11/07/2015 01/24/2015  Falls in the past year? No No  Risk for fall due to : History of fall(s) -   Functional Status Survey:    Vitals:   02/17/16 1210  BP: (!) 104/52  Pulse: 86  Resp: 18  Temp: 97.6 F (36.4 C)  Weight: 167 lb (75.8 kg)   Body mass index is 31.55 kg/m.  Wt Readings from Last 3 Encounters:  02/17/16 167 lb (75.8 kg)  12/02/15 162 lb 9.6 oz (73.8 kg)  10/20/15 159 lb 3.2 oz (72.2 kg)   Physical Exam  Constitutional: She  is oriented to person, place, and time. No distress.  HENT:  Head: Normocephalic and atraumatic.  Cardiovascular: Normal rate.   No murmur heard. Increased edema to BLE +2 without redness  Pulmonary/Chest: Effort normal. She has rales (bilat bases).  Abdominal: Soft. Bowel sounds are normal.  Lymphadenopathy:    She has cervical adenopathy (right ant chain).  Neurological: She is alert and oriented to person, place, and time.  Skin: Skin is warm and dry. She is not diaphoretic.  Psychiatric: She has a normal mood and affect.    Labs reviewed:  Recent Labs  09/19/15  NA 138  K 4.1  BUN 11  CREATININE 0.6    Recent Labs  09/19/15  AST 26  ALT 15  ALKPHOS 64    Recent Labs  04/01/15 09/19/15 10/20/15  WBC 8.1 11.7 8.8  HGB 11.3* 10.6* 11.5*  HCT 35* 34* 37  PLT 198 282 171   Lab Results  Component Value Date   TSH 0.76 10/04/2015   Lab Results  Component Value Date   HGBA1C 6.4 09/19/2015   Lab Results  Component Value Date   CHOL 135 09/19/2015   HDL 50 09/19/2015   LDLCALC 55 09/19/2015   TRIG 149 09/19/2015   CHOLHDL 4.1 07/01/2012    Significant Diagnostic Results in last 30 days:  No results found.  Assessment/Plan 1. Esophageal dysmotility No issues Continue baby food diet to prevent impaction  2. Other specified hypothyroidism Continue synthroid 75 mcg qd  3. Anemia due to chronic blood loss Stable off plavix Follow CBC q 6 mo  4. Situational depression Improved mood  Lexapro 20 mg qd  5. Hemiparesis affecting right side as late effect of cerebrovascular accident [facility]) [place] lift to chair, uses scooter to move throughout the facility  6. Atrioventricular block, complete ([place]) S/p pacemaker Interrogated 02/06/16  7. Essential hypertension Controlled Cozaar 25 mg qd If no improvement in edema with compression hose may add low dose lasix  8. Chronic venous insufficiency Encouraged her to wear compression hose and elevated  extremities NO s/s of CHF, will monitor closely    Family/ staff Communication: discussed with resident and staff  Labs/tests ordered:  NA

## 2016-03-01 DIAGNOSIS — I872 Venous insufficiency (chronic) (peripheral): Secondary | ICD-10-CM | POA: Insufficient documentation

## 2016-03-15 ENCOUNTER — Encounter: Payer: Self-pay | Admitting: Adult Health

## 2016-03-15 ENCOUNTER — Non-Acute Institutional Stay (SKILLED_NURSING_FACILITY): Payer: Medicare Other | Admitting: Adult Health

## 2016-03-15 DIAGNOSIS — H04123 Dry eye syndrome of bilateral lacrimal glands: Secondary | ICD-10-CM

## 2016-03-15 DIAGNOSIS — I10 Essential (primary) hypertension: Secondary | ICD-10-CM

## 2016-03-15 DIAGNOSIS — I442 Atrioventricular block, complete: Secondary | ICD-10-CM

## 2016-03-15 DIAGNOSIS — K224 Dyskinesia of esophagus: Secondary | ICD-10-CM | POA: Diagnosis not present

## 2016-03-15 DIAGNOSIS — E039 Hypothyroidism, unspecified: Secondary | ICD-10-CM

## 2016-03-15 DIAGNOSIS — I872 Venous insufficiency (chronic) (peripheral): Secondary | ICD-10-CM | POA: Diagnosis not present

## 2016-03-15 DIAGNOSIS — I679 Cerebrovascular disease, unspecified: Secondary | ICD-10-CM

## 2016-03-15 DIAGNOSIS — F0150 Vascular dementia without behavioral disturbance: Secondary | ICD-10-CM

## 2016-03-15 DIAGNOSIS — R635 Abnormal weight gain: Secondary | ICD-10-CM

## 2016-03-15 NOTE — Progress Notes (Signed)
Patient ID: Rachel Cooke, female   DOB: 07/11/1919, 83 y.o.   MRN: [id number]  Location:  [place]:  SNF (31) Provider:  Cindi Carbon, [place] [phone]  REED, Jonelle Sidle, DO  Patient Care Team: [name], DO as PCP - General (Geriatric Medicine) [facility] Clarene Essex, MD as Attending Physician (Gastroenterology) Garvin Fila, MD as Consulting Physician (Neurology)  Extended Emergency Contact Information Primary Emergency Contact: [place] Address: [address]          [place], [place] [address] [name] of [place] Phone: [phone] Mobile Phone: [phone] Relation: Daughter  Code Status:  DNR Goals of care: Advanced Directive information Advanced Directives 03/15/2016  Does Patient Have a Medical Advance Directive? Yes  Type of Paramedic of [place];Living will;Out of facility DNR (pink MOST or yellow form)  Does patient want to make changes to medical advance directive? -  Copy of [place] in Chart? Yes  Pre-existing out of facility DNR order (yellow form or pink MOST form) -     Chief Complaint  Patient presents with  . Medical Management of Chronic Issues    HPI:  Pt is a 83 y.o. female seen today for medical management of chronic diseases. She resides in skilled care due to a hx of CVA with residual right sided weakness and vascular dementia. Hx of dysphagia and esophageal motility currently consuming baby food without difficulty but does have a waiver to eat goldfish and chocolate. No recent bouts pna.    Hx of agitation and anxiety/depression due to loss of independence, improved spirits during our visit, currently on lexapro 20 mg qd. Started on Namenda for memory loss, which seems to have tolerated well. MMSE 27/30 09/08/15 She has a hx of dry eyes, staff report that she uses refresh daily.  Last  Ophthalmology apt August of 2017. Hx of complete HB, s/p pacemaker in 2003.  No issues regarding this  BP 141/72, goal 150/90 or less due to age/debility Lipids are not longer monitored due to age/debility Lab Results  Component Value Date   HGBA1C 6.4 09/19/2015    She quit wearing compression hose last month and gained 8 lbs. I asked her to start wearing them again and she has done so.  The edema in her legs improved, however, her weight increased by 3 more lbs. No SOB, orthopnea, CP, etc.   Lab Results  Component Value Date   TSH 0.76 10/04/2015   2 D echo 2015 EF 55-60% Grade 1 DD significant AS noted but mean gradient only 9 mmhg  Past Medical History:  Diagnosis Date  . Abnormality of gait 2007  . Acquired cyst of kidney 2002  . Acute sinus infection 11/25/2012  . Allergic rhinitis due to pollen   . Altered mental status 11/28/2012  . Anxiety state, unspecified   . Atrioventricular block, complete ([place]) 2003   s/p PPM 2003, generator chnage 2011  . Cardiac pacemaker in situ 2003  . Coronary atherosclerosis of native coronary artery 2003   non obstructive by Cath 2003  . CVA (cerebral infarction) 06/28/2012   rt hemiparesis, dysphagia  . Dendritic keratitis 2012  . Dysuria   . Edema 2003  . Esophageal dysmotility 08/19/2012  . Food impaction of esophagus 08/15/2012  . HTN (hypertension) 06/28/2012  . Hypertension   . Macular degeneration (senile) of retina, unspecified 2012  . Malignant neoplasm of breast (female),  unspecified site 1970   S/P Rt radical mastectomy  . NSTEMI (non-ST elevated myocardial infarction) ([place]) 06/28/2012  . Osteoarthrosis, unspecified whether generalized or localized, unspecified site 2013  . Pacemaker   . Personal history of colonic polyps     s/p colonoscopy/polypectomy 2003  . Pure hyperglyceridemia 2004  . Senile osteoporosis 2003  . Stroke ([place]) 06/28/2012  . Tinnitus 2007  . Type II or unspecified type diabetes mellitus without mention of  complication, not stated as uncontrolled 2003  . Unspecified arthropathy, pelvic region and thigh 2010  . Unspecified glaucoma(365.9) 2013  . Unspecified hypothyroidism 2009  . Unspecified vitamin D deficiency 2009  . Urinary retention 07/07/2012   Past Surgical History:  Procedure Laterality Date  . BOTOX INJECTION N/A 10/03/2012   Procedure: BOTOX INJECTION;  Surgeon: [name], MD;  [facility] ENDOSCOPY;  Service: Endoscopy;  Laterality: N/A;  . ESOPHAGOGASTRODUODENOSCOPY N/A 08/15/2012   Procedure: ESOPHAGOGASTRODUODENOSCOPY (EGD);  Surgeon: [name], MD;  Location: [facility] ENDOSCOPY;  Service: Endoscopy;  Laterality: N/A;  . ESOPHAGOGASTRODUODENOSCOPY N/A 08/15/2012   Procedure: ESOPHAGOGASTRODUODENOSCOPY (EGD);  Surgeon: [name], MD;  Location: [place];  Service: Endoscopy;  Laterality: N/A;  . ESOPHAGOGASTRODUODENOSCOPY N/A 10/03/2012   Procedure: ESOPHAGOGASTRODUODENOSCOPY (EGD);  Surgeon: [name], MD;  Location: [facility];  Service: Endoscopy;  Laterality: N/A;  . ESOPHAGOGASTRODUODENOSCOPY N/A 12/22/2012   Procedure: ESOPHAGOGASTRODUODENOSCOPY (EGD);  Surgeon: [name], MD;  Location: [facility] ENDOSCOPY;  Service: Endoscopy;  Laterality: N/A;  . ESOPHAGOSCOPY N/A 08/15/2012   Procedure: ESOPHAGOSCOPY;  Surgeon: [name], MD;  Location: [place];  Service: ENT;  Laterality: N/A;  . FOREIGN BODY REMOVAL ESOPHAGEAL N/A 08/15/2012   Procedure: REMOVAL FOREIGN BODY ESOPHAGEAL;  Surgeon: [name], MD;  Location: [place];  Service: ENT;  Laterality: N/A;  . INSERT / REPLACE / REMOVE PACEMAKER    . MASTECTOMY Right 1970   Cancer  . SAVORY DILATION N/A 10/03/2012   Procedure: SAVORY DILATION;  Surgeon: [name], MD;  [facility];  Service: Endoscopy;  Laterality: N/A;    Allergies  Allergen Reactions  . Augmentin [Amoxicillin-Pot Clavulanate] Nausea And Vomiting  . Lactose Intolerance (Gi)     Allergies as of 03/15/2016      Reactions   Augmentin  [amoxicillin-pot Clavulanate] Nausea And Vomiting   Lactose Intolerance (gi)       Medication List       Accurate as of 03/15/16  3:57 PM. Always use your most recent med list.          acetaminophen 650 MG CR tablet Commonly known as:  TYLENOL Take 650 mg by mouth 2 (two) times daily. For pain   BIOTENE DRY MOUTH CARE DT Place 1 spray onto teeth 4 (four) times daily.   carboxymethylcellulose 0.5 % Soln Commonly known as:  REFRESH PLUS 2 drops 2 (two) times daily as needed.   escitalopram 20 MG tablet Commonly known as:  LEXAPRO Take 20 mg by mouth daily. For depression/ anxiety.   GLUCERNA Liqd Take 237 mLs by mouth. At breakfast and lunch   ipratropium-albuterol 0.5-2.5 (3) MG/3ML Soln Commonly known as:  DUONEB Take 3 mLs by nebulization every 6 (six) hours as needed (cough/wheezing).   levothyroxine 75 MCG tablet Commonly known as:  SYNTHROID, LEVOTHROID Take 75 mcg by mouth daily before breakfast. For thyroid therapy   liver oil-zinc oxide 40 % ointment Commonly known as:  DESITIN Apply 1 application topically as needed for irritation.  loratadine 10 MG tablet Commonly known as:  CLARITIN Take 10 mg by mouth daily.   LORazepam 0.5 MG tablet Commonly known as:  ATIVAN Take 0.5 mg by mouth every 8 (eight) hours as needed for anxiety.   losartan 25 MG tablet Commonly known as:  COZAAR Take 1 tablet (25 mg total) by mouth daily.   Melatonin 5 MG Tabs Take 5 tablets by mouth at bedtime.   memantine 10 MG tablet Commonly known as:  NAMENDA Take 10 mg by mouth 2 (two) times daily.   metoprolol tartrate 25 MG tablet Commonly known as:  LOPRESSOR Take 25 mg by mouth 2 (two) times daily.   multivitamin tablet Take 1 tablet by mouth daily.   omeprazole 20 MG capsule Commonly known as:  PRILOSEC Take 20 mg by mouth daily.   PRESERVISION AREDS PO Take by mouth daily.   trolamine salicylate 10 % cream Commonly known as:  ASPERCREME Apply 1  application topically 2 (two) times daily as needed for muscle pain.   Witch Hazel 14 % Liqd Apply topically as needed (for hemorrhoid flare up).       Review of Systems  Constitutional: Positive for unexpected weight change. Negative for activity change, appetite change, chills, diaphoresis, fatigue and fever.  HENT: Negative for congestion.   Eyes: Negative for visual disturbance.       Dry eyes  Respiratory: Negative for cough, shortness of breath, wheezing and stridor.   Cardiovascular: Positive for leg swelling. Negative for chest pain and palpitations.  Gastrointestinal: Negative for abdominal distention, constipation and diarrhea.  Genitourinary: Negative for difficulty urinating.  Musculoskeletal: Positive for arthralgias and gait problem.  Skin: Negative for color change, pallor, rash and wound.  Neurological: Positive for weakness. Negative for dizziness, facial asymmetry and headaches.  Psychiatric/Behavioral: Positive for confusion. Negative for agitation and behavioral problems.    Immunization History  Administered Date(s) Administered  . Influenza Inj Mdck Quad Pf 12/22/2015  . Influenza Whole 12/17/2012  . Influenza-Unspecified 12/08/2013, 12/16/2014  . PPD Test 07/18/2012  . Pneumococcal Polysaccharide-23 08/27/2015   Pertinent  Health Maintenance Due  Topic Date Due  . URINE MICROALBUMIN  10/30/1931  . DEXA SCAN  10/30/1986  . HEMOGLOBIN A1C  03/21/2016  . PNA vac Low Risk Adult (2 of 2 - PCV13) 08/26/2016  . OPHTHALMOLOGY EXAM  10/25/2016  . FOOT EXAM  01/02/2017  . INFLUENZA VACCINE  Completed   Fall Risk  11/07/2015 01/24/2015  Falls in the past year? No No  Risk for fall due to : History of fall(s) -   Functional Status Survey:    Vitals:   03/15/16 1544  BP: (!) 141/72  Pulse: 86  Resp: 19  Temp: 98.4 F (36.9 C)  SpO2: 94%  Weight: 170 lb (77.1 kg)   Body mass index is 32.12 kg/m.  Wt Readings from Last 3 Encounters:  03/15/16 170 lb  (77.1 kg)  02/17/16 167 lb (75.8 kg)  12/02/15 162 lb 9.6 oz (73.8 kg)   Physical Exam  Constitutional: She is oriented to person, place, and time. No distress.  HENT:  Head: Normocephalic and atraumatic.  Cardiovascular: Normal rate and regular rhythm.   No murmur heard.  BLE +1 improved  Pulmonary/Chest: Effort normal. She has rales (bilat bases).  Abdominal: Soft. Bowel sounds are normal. She exhibits no distension. There is no tenderness.  Lymphadenopathy:    She has no cervical adenopathy.  Neurological: She is alert and oriented to person, place, and time.  Skin: Skin is warm and dry. She is not diaphoretic.  Psychiatric: She has a normal mood and affect.  Nursing note and vitals reviewed.   Labs reviewed:  Recent Labs  09/19/15  NA 138  K 4.1  BUN 11  CREATININE 0.6    Recent Labs  09/19/15  AST 26  ALT 15  ALKPHOS 64    Recent Labs  04/01/15 09/19/15 10/20/15  WBC 8.1 11.7 8.8  HGB 11.3* 10.6* 11.5*  HCT 35* 34* 37  PLT 198 282 171   Lab Results  Component Value Date   TSH 0.76 10/04/2015   Lab Results  Component Value Date   HGBA1C 6.4 09/19/2015   Lab Results  Component Value Date   CHOL 135 09/19/2015   HDL 50 09/19/2015   LDLCALC 55 09/19/2015   TRIG 149 09/19/2015   CHOLHDL 4.1 07/01/2012    Significant Diagnostic Results in last 30 days:  No results found.  Assessment/Plan  1. Weight gain ? Increased caloric intake No other signs of CHF, would not give lasix at this point as she is prone to dehydration due to her limited diet Check Labs and continue to monitor  2. Chronic venous insufficiency Continue compression hose  3. Cerebrovascular disease No recent acute events Has hemiparesis on the right due to previous CVA Goals of care are comfort based with no aggressive measures  4. Esophageal dysmotility Continue baby food diet and asp prec At risk for impaction due to the fact she is eating chocolate and goldfish (signed  waiver)  5. Acquired hypothyroidism Check TSH due to weight gain  6. Vascular dementia without behavioral disturbance Continue Namenda 10 mg BID  7. Atrioventricular block, complete ([place]) Followed by Dr Caryl Comes. Remote check successful in Dec of 2017  8. Essential hypertension Controlled Continue Cozaar 25 mg qd and Lopressor 25 mg BID  9. Dry eyes Change refresh to 2 gtts BID scheduled  Family/ staff Communication: discussed with resident and staff  Labs/tests ordered: CBC BMP TSH

## 2016-03-23 DIAGNOSIS — F0150 Vascular dementia without behavioral disturbance: Secondary | ICD-10-CM | POA: Insufficient documentation

## 2016-03-26 DIAGNOSIS — E039 Hypothyroidism, unspecified: Secondary | ICD-10-CM | POA: Diagnosis not present

## 2016-03-26 LAB — BASIC METABOLIC PANEL
BUN: 19 mg/dL (ref 4–21)
CREATININE: 0.6 mg/dL (ref 0.5–1.1)
GLUCOSE: 138 mg/dL
POTASSIUM: 4.6 mmol/L (ref 3.4–5.3)
Sodium: 142 mmol/L (ref 137–147)

## 2016-03-26 LAB — TSH: TSH: 0.88 u[IU]/mL (ref 0.41–5.90)

## 2016-03-26 LAB — CBC AND DIFFERENTIAL
HCT: 36 % (ref 36–46)
Hemoglobin: 12.2 g/dL (ref 12.0–16.0)
PLATELETS: 128 10*3/uL — AB (ref 150–399)
WBC: 8.5 10*3/mL

## 2016-03-27 DIAGNOSIS — L84 Corns and callosities: Secondary | ICD-10-CM | POA: Diagnosis not present

## 2016-03-27 DIAGNOSIS — L602 Onychogryphosis: Secondary | ICD-10-CM | POA: Diagnosis not present

## 2016-03-27 DIAGNOSIS — E1159 Type 2 diabetes mellitus with other circulatory complications: Secondary | ICD-10-CM | POA: Diagnosis not present

## 2016-04-11 DIAGNOSIS — H353132 Nonexudative age-related macular degeneration, bilateral, intermediate dry stage: Secondary | ICD-10-CM | POA: Diagnosis not present

## 2016-04-12 ENCOUNTER — Non-Acute Institutional Stay (SKILLED_NURSING_FACILITY): Payer: Medicare Other | Admitting: Adult Health

## 2016-04-12 DIAGNOSIS — E039 Hypothyroidism, unspecified: Secondary | ICD-10-CM | POA: Diagnosis not present

## 2016-04-12 DIAGNOSIS — D500 Iron deficiency anemia secondary to blood loss (chronic): Secondary | ICD-10-CM | POA: Diagnosis not present

## 2016-04-12 DIAGNOSIS — I872 Venous insufficiency (chronic) (peripheral): Secondary | ICD-10-CM

## 2016-04-12 DIAGNOSIS — K640 First degree hemorrhoids: Secondary | ICD-10-CM | POA: Diagnosis not present

## 2016-04-12 DIAGNOSIS — R635 Abnormal weight gain: Secondary | ICD-10-CM | POA: Diagnosis not present

## 2016-04-12 DIAGNOSIS — F0150 Vascular dementia without behavioral disturbance: Secondary | ICD-10-CM

## 2016-04-12 NOTE — Progress Notes (Signed)
Patient ID: Rachel Cooke, female   DOB: 02-11-1920, 83 y.o.   MRN: [id number]  Location:  [place]:  SNF (31) Provider:  Cindi Carbon, [place] [phone]  REED, Jonelle Sidle, DO  Patient Care Team: [name], DO as PCP - General (Geriatric Medicine) [facility] Clarene Essex, MD as Attending Physician (Gastroenterology) Garvin Fila, MD as Consulting Physician (Neurology)  Extended Emergency Contact Information Primary Emergency Contact: [place] Address: [address]          [place], [place] [address] [name] of [place] Phone: [phone] Mobile Phone: [phone] Relation: Daughter  Code Status:  DNR Goals of care: Advanced Directive information Advanced Directives 03/15/2016  Does Patient Have a Medical Advance Directive? Yes  Type of Paramedic of [place];Living will;Out of facility DNR (pink MOST or yellow form)  Does patient want to make changes to medical advance directive? -  Copy of [place] in Chart? Yes  Pre-existing out of facility DNR order (yellow form or pink MOST form) -     Chief Complaint  Patient presents with  . Medical Management of Chronic Issues    HPI:  Pt is a 83 y.o. female seen today for medical management of chronic diseases. She resides in skilled care due to a hx of CVA with residual right sided weakness and vascular dementia. Hx of dysphagia and esophageal motility currently consuming baby food without difficulty but does have a waiver to eat goldfish and chocolate. No recent bouts pna.    Started on Namenda 02/02/16 for memory loss, which seems to have tolerated well. MMSE 27/30 09/08/15 Ophthalmology 04/11/16 Lab Results  Component Value Date   HGBA1C 6.4 09/19/2015      2 D echo 2015 EF 55-60% Grade 1 DD significant AS noted but mean gradient only 9 mmhg Steady weight gain since August  of 2017. Was 159 at that time, now 172 lbs. NO sob or chest pain. Normal TSH. Edema improved with compression hose.  Staff reported an episode of rectal bleeding with hemorrhoid which has since resolved. This is not a new issue. She has witch hazel which seems to help. She denies any constipation or straining with BMs. Eats baby food due to esophageal dysmotility.     Past Medical History:  Diagnosis Date  . Abnormality of gait 2007  . Acquired cyst of kidney 2002  . Acute sinus infection 11/25/2012  . Allergic rhinitis due to pollen   . Altered mental status 11/28/2012  . Anxiety state, unspecified   . Atrioventricular block, complete ([place]) 2003   s/p PPM 2003, generator chnage 2011  . Cardiac pacemaker in situ 2003  . Coronary atherosclerosis of native coronary artery 2003   non obstructive by Cath 2003  . CVA (cerebral infarction) 06/28/2012   rt hemiparesis, dysphagia  . Dendritic keratitis 2012  . Dysuria   . Edema 2003  . Esophageal dysmotility 08/19/2012  . Food impaction of esophagus 08/15/2012  . HTN (hypertension) 06/28/2012  . Hypertension   . Macular degeneration (senile) of retina, unspecified 2012  . Malignant neoplasm of breast (female), unspecified site 1970   S/P Rt radical mastectomy  . NSTEMI (non-ST elevated myocardial infarction) ([place]) 06/28/2012  . Osteoarthrosis, unspecified whether generalized or localized, unspecified site 2013  . Pacemaker   . Personal history of colonic polyps     s/p colonoscopy/polypectomy 2003  . Pure hyperglyceridemia 2004  .  Senile osteoporosis 2003  . Stroke ([place]) 06/28/2012  . Tinnitus 2007  . Type II or unspecified type diabetes mellitus without mention of complication, not stated as uncontrolled 2003  . Unspecified arthropathy, pelvic region and thigh 2010  . Unspecified glaucoma(365.9) 2013  . Unspecified hypothyroidism 2009  . Unspecified vitamin D deficiency 2009  . Urinary retention 07/07/2012   Past Surgical History:    Procedure Laterality Date  . BOTOX INJECTION N/A 10/03/2012   Procedure: BOTOX INJECTION;  Surgeon: [name], MD;  Location: [facility] ENDOSCOPY;  Service: Endoscopy;  Laterality: N/A;  . ESOPHAGOGASTRODUODENOSCOPY N/A 08/15/2012   Procedure: ESOPHAGOGASTRODUODENOSCOPY (EGD);  Surgeon: [name], MD;  Location: [facility] ENDOSCOPY;  Service: Endoscopy;  Laterality: N/A;  . ESOPHAGOGASTRODUODENOSCOPY N/A 08/15/2012   Procedure: ESOPHAGOGASTRODUODENOSCOPY (EGD);  Surgeon: [name], MD;  Location: [place];  Service: Endoscopy;  Laterality: N/A;  . ESOPHAGOGASTRODUODENOSCOPY N/A 10/03/2012   Procedure: ESOPHAGOGASTRODUODENOSCOPY (EGD);  Surgeon: [name], MD;  Location: [facility];  Service: Endoscopy;  Laterality: N/A;  . ESOPHAGOGASTRODUODENOSCOPY N/A 12/22/2012   Procedure: ESOPHAGOGASTRODUODENOSCOPY (EGD);  Surgeon: [name], MD;  [facility] ENDOSCOPY;  Service: Endoscopy;  Laterality: N/A;  . ESOPHAGOSCOPY N/A 08/15/2012   Procedure: ESOPHAGOSCOPY;  Surgeon: [name], MD;  Location: [place];  Service: ENT;  Laterality: N/A;  . FOREIGN BODY REMOVAL ESOPHAGEAL N/A 08/15/2012   Procedure: REMOVAL FOREIGN BODY ESOPHAGEAL;  Surgeon: [name], MD;  Location: [place];  Service: ENT;  Laterality: N/A;  . INSERT / REPLACE / REMOVE PACEMAKER    . MASTECTOMY Right 1970   Cancer  . SAVORY DILATION N/A 10/03/2012   Procedure: SAVORY DILATION;  Surgeon: [name], MD;  [facility];  Service: Endoscopy;  Laterality: N/A;    Allergies  Allergen Reactions  . Augmentin [Amoxicillin-Pot Clavulanate] Nausea And Vomiting  . Lactose Intolerance (Gi)     Allergies as of 04/12/2016      Reactions   Augmentin [amoxicillin-pot Clavulanate] Nausea And Vomiting   Lactose Intolerance (gi)       Medication List       Accurate as of 04/12/16  3:39 PM. Always use your most recent med list.          acetaminophen 650 MG CR tablet Commonly known as:  TYLENOL Take 650 mg by mouth 2 (two) times  daily. For pain   BIOTENE DRY MOUTH CARE DT Place 1 spray onto teeth 4 (four) times daily.   carboxymethylcellulose 0.5 % Soln Commonly known as:  REFRESH PLUS 2 drops 2 (two) times daily as needed.   escitalopram 20 MG tablet Commonly known as:  LEXAPRO Take 20 mg by mouth daily. For depression/ anxiety.   GLUCERNA Liqd Take 237 mLs by mouth. At breakfast and lunch   ipratropium-albuterol 0.5-2.5 (3) MG/3ML Soln Commonly known as:  DUONEB Take 3 mLs by nebulization every 6 (six) hours as needed (cough/wheezing).   levothyroxine 75 MCG tablet Commonly known as:  SYNTHROID, LEVOTHROID Take 75 mcg by mouth daily before breakfast. For thyroid therapy   liver oil-zinc oxide 40 % ointment Commonly known as:  DESITIN Apply 1 application topically as needed for irritation.   loratadine 10 MG tablet Commonly known as:  CLARITIN Take 10 mg by mouth daily.   LORazepam 0.5 MG tablet Commonly known as:  ATIVAN Take 0.5 mg by mouth every 8 (eight) hours as needed for anxiety.   losartan 25 MG tablet Commonly known as:  COZAAR Take 1  tablet (25 mg total) by mouth daily.   Melatonin 5 MG Tabs Take 5 tablets by mouth at bedtime.   memantine 10 MG tablet Commonly known as:  NAMENDA Take 10 mg by mouth 2 (two) times daily.   metoprolol tartrate 25 MG tablet Commonly known as:  LOPRESSOR Take 25 mg by mouth 2 (two) times daily.   multivitamin tablet Take 1 tablet by mouth daily.   omeprazole 20 MG capsule Commonly known as:  PRILOSEC Take 20 mg by mouth daily.   PRESERVISION AREDS PO Take by mouth daily.   trolamine salicylate 10 % cream Commonly known as:  ASPERCREME Apply 1 application topically 2 (two) times daily as needed for muscle pain.   Witch Hazel 14 % Liqd Apply topically as needed (for hemorrhoid flare up).       Review of Systems  Constitutional: Positive for unexpected weight change. Negative for activity change, appetite change, chills, diaphoresis,  fatigue and fever.  HENT: Negative for congestion.   Eyes: Negative for visual disturbance.       Dry eyes  Respiratory: Negative for cough, shortness of breath, wheezing and stridor.   Cardiovascular: Positive for leg swelling. Negative for chest pain and palpitations.  Gastrointestinal: Positive for anal bleeding. Negative for abdominal distention, abdominal pain, constipation, diarrhea, nausea, rectal pain and vomiting.  Genitourinary: Negative for difficulty urinating.  Musculoskeletal: Positive for arthralgias and gait problem.  Skin: Negative for color change, pallor, rash and wound.  Neurological: Positive for weakness. Negative for dizziness, facial asymmetry and headaches.  Psychiatric/Behavioral: Positive for confusion. Negative for agitation and behavioral problems.    Immunization History  Administered Date(s) Administered  . Influenza Inj Mdck Quad Pf 12/22/2015  . Influenza Whole 12/17/2012  . Influenza-Unspecified 12/08/2013, 12/16/2014  . PPD Test 07/18/2012  . Pneumococcal Polysaccharide-23 08/27/2015   Pertinent  Health Maintenance Due  Topic Date Due  . URINE MICROALBUMIN  10/30/1931  . DEXA SCAN  10/30/1986  . HEMOGLOBIN A1C  03/21/2016  . PNA vac Low Risk Adult (2 of 2 - PCV13) 08/26/2016  . OPHTHALMOLOGY EXAM  10/25/2016  . FOOT EXAM  01/02/2017  . INFLUENZA VACCINE  Completed   Fall Risk  11/07/2015 01/24/2015  Falls in the past year? No No  Risk for fall due to : History of fall(s) -   Functional Status Survey:    Vitals:   04/12/16 1529  Weight: 172 lb (78 kg)   Body mass index is 32.5 kg/m.  Wt Readings from Last 3 Encounters:  04/12/16 172 lb (78 kg)  03/15/16 170 lb (77.1 kg)  02/17/16 167 lb (75.8 kg)   Physical Exam  Constitutional: She is oriented to person, place, and time. No distress.  HENT:  Head: Normocephalic and atraumatic.  Cardiovascular: Normal rate and regular rhythm.   No murmur heard.  BLE +1 improved  Pulmonary/Chest:  Effort normal. She has rales (bilat bases).  Abdominal: Soft. Bowel sounds are normal. She exhibits no distension. There is no tenderness.  Genitourinary:  Genitourinary Comments: Normal rectal exam, no hemorrhoids noted. No bleeding, pain, tenderness on exam.  Lymphadenopathy:    She has no cervical adenopathy.  Neurological: She is alert and oriented to person, place, and time.  Skin: Skin is warm and dry. She is not diaphoretic.  Psychiatric: She has a normal mood and affect.  Nursing note and vitals reviewed.   Labs reviewed:  Recent Labs  09/19/15 03/26/16  NA 138 142  K 4.1 4.6  BUN 11  19  CREATININE 0.6 0.6    Recent Labs  09/19/15  AST 26  ALT 15  ALKPHOS 64    Recent Labs  09/19/15 10/20/15 03/26/16  WBC 11.7 8.8 8.5  HGB 10.6* 11.5* 12.2  HCT 34* 37 36  PLT 282 171 128*   Lab Results  Component Value Date   TSH 0.88 03/26/2016   Lab Results  Component Value Date   HGBA1C 6.4 09/19/2015   Lab Results  Component Value Date   CHOL 135 09/19/2015   HDL 50 09/19/2015   LDLCALC 55 09/19/2015   TRIG 149 09/19/2015   CHOLHDL 4.1 07/01/2012    Significant Diagnostic Results in last 30 days:  No results found.  Assessment/Plan  1. Grade I hemorrhoids Resolved Continue prn witch hazel No further work up for this issue due to age and debility  2. Vascular dementia without behavioral disturbance Daughter reports improved interactions Continue namenda  3. Anemia due to chronic blood loss Stable H/H  4. Acquired hypothyroidism Continue synthroid 75 mcg qd  5. Chronic venous insufficiency Improved with compression hose  6. Weight gain Does not appear to be in fluid over load Normal TSH ? Intake related Continue to monitor Started before [employer] staff Communication: discussed with resident and staff  Labs/tests ordered: NA

## 2016-05-07 ENCOUNTER — Telehealth: Payer: Self-pay | Admitting: Cardiology

## 2016-05-07 ENCOUNTER — Ambulatory Visit (INDEPENDENT_AMBULATORY_CARE_PROVIDER_SITE_OTHER): Payer: Medicare Other | Admitting: *Deleted

## 2016-05-07 DIAGNOSIS — I442 Atrioventricular block, complete: Secondary | ICD-10-CM | POA: Diagnosis not present

## 2016-05-07 NOTE — Progress Notes (Signed)
Remote pacemaker transmission.   

## 2016-05-07 NOTE — Telephone Encounter (Signed)
Confirmed remote transmission w/ pt son in law.   

## 2016-05-08 ENCOUNTER — Encounter: Payer: Self-pay | Admitting: Cardiology

## 2016-05-08 LAB — CUP PACEART REMOTE DEVICE CHECK
Battery Impedance: 635 Ohm
Battery Remaining Longevity: 69 mo
Brady Statistic AP VP Percent: 0 %
Brady Statistic AS VS Percent: 0 %
Date Time Interrogation Session: 20180305182332
Implantable Lead Implant Date: 20110506
Implantable Lead Location: 753859
Implantable Pulse Generator Implant Date: 20110506
Lead Channel Impedance Value: 399 Ohm
Lead Channel Impedance Value: 474 Ohm
Lead Channel Pacing Threshold Amplitude: 1.125 V
Lead Channel Setting Pacing Amplitude: 2 V
Lead Channel Setting Pacing Amplitude: 2.5 V
Lead Channel Setting Sensing Sensitivity: 2.8 mV
MDC IDC LEAD IMPLANT DT: 20110506
MDC IDC LEAD LOCATION: 753860
MDC IDC MSMT BATTERY VOLTAGE: 2.78 V
MDC IDC MSMT LEADCHNL RA PACING THRESHOLD AMPLITUDE: 0.75 V
MDC IDC MSMT LEADCHNL RA PACING THRESHOLD PULSEWIDTH: 0.4 ms
MDC IDC MSMT LEADCHNL RV PACING THRESHOLD PULSEWIDTH: 0.4 ms
MDC IDC SET LEADCHNL RV PACING PULSEWIDTH: 0.4 ms
MDC IDC STAT BRADY AP VS PERCENT: 0 %
MDC IDC STAT BRADY AS VP PERCENT: 100 %

## 2016-05-14 ENCOUNTER — Non-Acute Institutional Stay (SKILLED_NURSING_FACILITY): Payer: Medicare Other | Admitting: Adult Health

## 2016-05-14 ENCOUNTER — Encounter: Payer: Self-pay | Admitting: Adult Health

## 2016-05-14 DIAGNOSIS — I890 Lymphedema, not elsewhere classified: Secondary | ICD-10-CM | POA: Insufficient documentation

## 2016-05-14 NOTE — Progress Notes (Signed)
Location:  Occupational psychologist of Service:  SNF (31) Provider:   Cindi Carbon, ANP [place] [phone]   REED, Jonelle Sidle, DO  Patient Care Team: [name], DO as PCP - General (Geriatric Medicine) [facility] Clarene Essex, MD as Attending Physician (Gastroenterology) Garvin Fila, MD as Consulting Physician (Neurology)  Extended Emergency Contact Information Primary Emergency Contact: [place] Address: [address]          [place], [place] [address] [name] of [place] Phone: [phone] Mobile Phone: [phone] Relation: Daughter  Code Status:  DNR Goals of care: Advanced Directive information Advanced Directives 03/15/2016  Does Patient Have a Medical Advance Directive? Yes  Type of Paramedic of [place];Living will;Out of facility DNR (pink MOST or yellow form)  Does patient want to make changes to medical advance directive? -  Copy of [place] in Chart? Yes  Pre-existing out of facility DNR order (yellow form or pink MOST form) -     Chief Complaint  Patient presents with  . Acute Visit    right arm edema    HPI:  Pt is a 83 y.o. female seen today for an acute visit for right arm edema.  Staff report that upon awakening this am she presented with right arm edema. There is no associated pain, warmth, or redness. She has a hx of previous right mastectomy in 1970.  Also a hx of right hemiparesis from previous CVA. No sob or chest pain. Has had steady weight gain over the past year.   Past Medical History:  Diagnosis Date  . Abnormality of gait 2007  . Acquired cyst of kidney 2002  . Acute sinus infection 11/25/2012  . Allergic rhinitis due to pollen   . Altered mental status 11/28/2012  . Anxiety state, unspecified   . Atrioventricular block, complete ([place]) 2003   s/p PPM 2003, generator chnage 2011  . Cardiac pacemaker in situ 2003    . Coronary atherosclerosis of native coronary artery 2003   non obstructive by Cath 2003  . CVA (cerebral infarction) 06/28/2012   rt hemiparesis, dysphagia  . Dendritic keratitis 2012  . Dysuria   . Edema 2003  . Esophageal dysmotility 08/19/2012  . Food impaction of esophagus 08/15/2012  . HTN (hypertension) 06/28/2012  . Hypertension   . Macular degeneration (senile) of retina, unspecified 2012  . Malignant neoplasm of breast (female), unspecified site 1970   S/P Rt radical mastectomy  . NSTEMI (non-ST elevated myocardial infarction) ([place]) 06/28/2012  . Osteoarthrosis, unspecified whether generalized or localized, unspecified site 2013  . Pacemaker   . Personal history of colonic polyps     s/p colonoscopy/polypectomy 2003  . Pure hyperglyceridemia 2004  . Senile osteoporosis 2003  . Stroke ([place]) 06/28/2012  . Tinnitus 2007  . Type II or unspecified type diabetes mellitus without mention of complication, not stated as uncontrolled 2003  . Unspecified arthropathy, pelvic region and thigh 2010  . Unspecified glaucoma(365.9) 2013  . Unspecified hypothyroidism 2009  . Unspecified vitamin D deficiency 2009  . Urinary retention 07/07/2012   Past Surgical History:  Procedure Laterality Date  . BOTOX INJECTION N/A 10/03/2012   Procedure: BOTOX INJECTION;  Surgeon: [name], MD;  [facility];  Service: Endoscopy;  Laterality: N/A;  . ESOPHAGOGASTRODUODENOSCOPY N/A 08/15/2012   Procedure: ESOPHAGOGASTRODUODENOSCOPY (EGD);  Surgeon: [name], MD;  Location: [facility] ENDOSCOPY;  Service: Endoscopy;  Laterality: N/A;  .  ESOPHAGOGASTRODUODENOSCOPY N/A 08/15/2012   Procedure: ESOPHAGOGASTRODUODENOSCOPY (EGD);  Surgeon: [name], MD;  Location: [place];  Service: Endoscopy;  Laterality: N/A;  . ESOPHAGOGASTRODUODENOSCOPY N/A 10/03/2012   Procedure: ESOPHAGOGASTRODUODENOSCOPY (EGD);  Surgeon: [name], MD;  Location: [facility];  Service: Endoscopy;  Laterality: N/A;  .  ESOPHAGOGASTRODUODENOSCOPY N/A 12/22/2012   Procedure: ESOPHAGOGASTRODUODENOSCOPY (EGD);  Surgeon: [name], MD;  Location: [facility] ENDOSCOPY;  Service: Endoscopy;  Laterality: N/A;  . ESOPHAGOSCOPY N/A 08/15/2012   Procedure: ESOPHAGOSCOPY;  Surgeon: [name], MD;  Location: [place];  Service: ENT;  Laterality: N/A;  . FOREIGN BODY REMOVAL ESOPHAGEAL N/A 08/15/2012   Procedure: REMOVAL FOREIGN BODY ESOPHAGEAL;  Surgeon: [name], MD;  Location: [place];  Service: ENT;  Laterality: N/A;  . INSERT / REPLACE / REMOVE PACEMAKER    . MASTECTOMY Right 1970   Cancer  . SAVORY DILATION N/A 10/03/2012   Procedure: SAVORY DILATION;  Surgeon: [name], MD;  [facility] ENDOS[facility];  Service: Endoscopy;  Laterality: N/A;    Allergies  Allergen Reactions  . Augmentin [Amoxicillin-Pot Clavulanate] Nausea And Vomiting  . Lactose Intolerance (Gi)     Allergies as of 05/14/2016      Reactions   Augmentin [amoxicillin-pot Clavulanate] Nausea And Vomiting   Lactose Intolerance (gi)       Medication List       Accurate as of 05/14/16  3:31 PM. Always use your most recent med list.          acetaminophen 650 MG CR tablet Commonly known as:  TYLENOL Take 650 mg by mouth 2 (two) times daily. For pain   BIOTENE DRY MOUTH CARE DT Place 1 spray onto teeth 4 (four) times daily.   carboxymethylcellulose 0.5 % Soln Commonly known as:  REFRESH PLUS 2 drops 2 (two) times daily.   escitalopram 20 MG tablet Commonly known as:  LEXAPRO Take 20 mg by mouth daily. For depression/ anxiety.   GLUCERNA Liqd Take 237 mLs by mouth. At breakfast and lunch   ipratropium-albuterol 0.5-2.5 (3) MG/3ML Soln Commonly known as:  DUONEB Take 3 mLs by nebulization every 6 (six) hours as needed (cough/wheezing).   levothyroxine 75 MCG tablet Commonly known as:  SYNTHROID, LEVOTHROID Take 75 mcg by mouth daily before breakfast. For thyroid therapy   liver oil-zinc oxide 40 % ointment Commonly known as:   DESITIN Apply 1 application topically as needed for irritation.   loratadine 10 MG tablet Commonly known as:  CLARITIN Take 10 mg by mouth daily.   LORazepam 0.5 MG tablet Commonly known as:  ATIVAN Take 0.5 mg by mouth every 8 (eight) hours as needed for anxiety.   losartan 25 MG tablet Commonly known as:  COZAAR Take 1 tablet (25 mg total) by mouth daily.   Melatonin 5 MG Tabs Take 5 tablets by mouth at bedtime.   memantine 10 MG tablet Commonly known as:  NAMENDA Take 10 mg by mouth 2 (two) times daily.   metoprolol tartrate 25 MG tablet Commonly known as:  LOPRESSOR Take 25 mg by mouth 2 (two) times daily.   multivitamin tablet Take 1 tablet by mouth daily.   omeprazole 20 MG capsule Commonly known as:  PRILOSEC Take 20 mg by mouth daily.   PRESERVISION AREDS PO Take by mouth daily.   trolamine salicylate 10 % cream Commonly known as:  ASPERCREME Apply 1 application topically 2 (two) times daily as needed for muscle pain.   Witch Hazel 14 %  Liqd Apply topically as needed (for hemorrhoid flare up).       Review of Systems  Constitutional: Positive for unexpected weight change. Negative for activity change, appetite change, chills, diaphoresis, fatigue and fever.  HENT: Negative for congestion.   Respiratory: Negative for cough, shortness of breath and wheezing.   Cardiovascular: Positive for leg swelling. Negative for chest pain and palpitations.  Gastrointestinal: Negative for abdominal distention, abdominal pain, constipation and diarrhea.  Genitourinary: Negative for difficulty urinating and dysuria.  Musculoskeletal: Positive for gait problem. Negative for arthralgias, back pain, joint swelling and myalgias.       Right arm swelling  Skin: Negative for color change, pallor, rash and wound.  Neurological: Positive for weakness. Negative for dizziness, tremors, seizures, syncope, facial asymmetry, speech difficulty, light-headedness, numbness and  headaches.  Psychiatric/Behavioral: Positive for confusion. Negative for agitation and behavioral problems.    Immunization History  Administered Date(s) Administered  . Influenza Inj Mdck Quad Pf 12/22/2015  . Influenza Whole 12/17/2012  . Influenza-Unspecified 12/08/2013, 12/16/2014  . PPD Test 07/18/2012  . Pneumococcal Polysaccharide-23 08/27/2015   Pertinent  Health Maintenance Due  Topic Date Due  . URINE MICROALBUMIN  10/30/1931  . DEXA SCAN  10/30/1986  . HEMOGLOBIN A1C  03/21/2016  . PNA vac Low Risk Adult (2 of 2 - PCV13) 08/26/2016  . OPHTHALMOLOGY EXAM  10/25/2016  . FOOT EXAM  01/02/2017  . INFLUENZA VACCINE  Completed   Fall Risk  11/07/2015 01/24/2015  Falls in the past year? No No  Risk for fall due to : History of fall(s) -   Functional Status Survey:    Vitals:   05/14/16 1435  BP: 134/82  Pulse: 83  Resp: 18  Temp: 97.8 F (36.6 C)  SpO2: 96%  Weight: 171 lb 1.6 oz (77.6 kg)   Body mass index is 32.33 kg/m.  Wt Readings from Last 3 Encounters:  05/14/16 171 lb 1.6 oz (77.6 kg)  04/12/16 172 lb (78 kg)  03/15/16 170 lb (77.1 kg)   Physical Exam  Constitutional: She is oriented to person, place, and time. No distress.  HENT:  Head: Normocephalic and atraumatic.  Neck: No JVD present.  Cardiovascular: Normal rate and regular rhythm.   No murmur heard. BLE edema +1  Pulmonary/Chest: Effort normal. No respiratory distress. She has no wheezes. She has rales (fine bibasilar).  Abdominal: Soft. Bowel sounds are normal. She exhibits no distension.  Musculoskeletal: She exhibits edema (right upper arm and elbow. No erythema or warmth). She exhibits no tenderness.  Neurological: She is alert and oriented to person, place, and time.  Skin: Skin is warm and dry. She is not diaphoretic.  Psychiatric: She has a normal mood and affect.    Labs reviewed:  Recent Labs  09/19/15 03/26/16  NA 138 142  K 4.1 4.6  BUN 11 19  CREATININE 0.6 0.6     Recent Labs  09/19/15  AST 26  ALT 15  ALKPHOS 64    Recent Labs  09/19/15 10/20/15 03/26/16  WBC 11.7 8.8 8.5  HGB 10.6* 11.5* 12.2  HCT 34* 37 36  PLT 282 171 128*   Lab Results  Component Value Date   TSH 0.88 03/26/2016   Lab Results  Component Value Date   HGBA1C 6.4 09/19/2015   Lab Results  Component Value Date   CHOL 135 09/19/2015   HDL 50 09/19/2015   LDLCALC 55 09/19/2015   TRIG 149 09/19/2015   CHOLHDL 4.1 07/01/2012  Significant Diagnostic Results in last 30 days:  No results found.  Assessment/Plan  1. Lymphedema of arm Due to previous mastectomy, also decreased mobility due to previous CVA affecting right side Due to age and goals of care she no longer receives screening tests for cancer Try compression sleeve (she requested to wear it at night since she wakes up with edema) Monitor weight   Family/ staff Communication: discussed with resident/staff  Labs/tests ordered:  NA

## 2016-05-22 ENCOUNTER — Non-Acute Institutional Stay (SKILLED_NURSING_FACILITY): Payer: Medicare Other | Admitting: Internal Medicine

## 2016-05-22 ENCOUNTER — Encounter: Payer: Self-pay | Admitting: Internal Medicine

## 2016-05-22 DIAGNOSIS — M1990 Unspecified osteoarthritis, unspecified site: Secondary | ICD-10-CM

## 2016-05-22 DIAGNOSIS — I890 Lymphedema, not elsewhere classified: Secondary | ICD-10-CM | POA: Diagnosis not present

## 2016-05-22 DIAGNOSIS — E039 Hypothyroidism, unspecified: Secondary | ICD-10-CM | POA: Diagnosis not present

## 2016-05-22 DIAGNOSIS — F0150 Vascular dementia without behavioral disturbance: Secondary | ICD-10-CM | POA: Diagnosis not present

## 2016-05-22 DIAGNOSIS — E118 Type 2 diabetes mellitus with unspecified complications: Secondary | ICD-10-CM | POA: Diagnosis not present

## 2016-05-22 DIAGNOSIS — K224 Dyskinesia of esophagus: Secondary | ICD-10-CM | POA: Diagnosis not present

## 2016-05-22 DIAGNOSIS — I872 Venous insufficiency (chronic) (peripheral): Secondary | ICD-10-CM | POA: Diagnosis not present

## 2016-05-22 NOTE — Progress Notes (Deleted)
Patient ID: Rachel Cooke, female   DOB: [birth year], 83 y.o.   MRN: [id number]  Location:  [place] Room Number: 109 Place of Service:  SNF (31) Provider:  Hollace Kinnier, DO  Patient Care Team: [name], DO as PCP - General (Geriatric Medicine) [facility] Clarene Essex, MD as Attending Physician (Gastroenterology) Garvin Fila, MD as Consulting Physician (Neurology)  Extended Emergency Contact Information Primary Emergency Contact: [place] Address: [address]          [place], [place] [address] [name] of [place] Phone: [phone] Mobile Phone: [phone] Relation: Daughter  Code Status:  *** Goals of care: Advanced Directive information Advanced Directives 05/22/2016  Does Patient Have a Medical Advance Directive? Yes  Type of Advance Directive Out of facility DNR (pink MOST or yellow form);[place];Living will  Does patient want to make changes to medical advance directive? -  Copy of [place] in Chart? Yes  Pre-existing out of facility DNR order (yellow form or pink MOST form) Yellow form placed in chart (order not valid for inpatient use)     Chief Complaint  Patient presents with  . Medical Management of Chronic Issues    routine visit    HPI:  Pt is a 83 y.o. female seen today for medical management of chronic diseases.     Past Medical History:  Diagnosis Date  . Abnormality of gait 2007  . Acquired cyst of kidney 2002  . Acute sinus infection 11/25/2012  . Allergic rhinitis due to pollen   . Altered mental status 11/28/2012  . Anxiety state, unspecified   . Atrioventricular block, complete ([place]) 2003   s/p PPM 2003, generator chnage 2011  . Cardiac pacemaker in situ 2003  . Coronary atherosclerosis of native coronary artery 2003   non obstructive by Cath 2003  . CVA (cerebral infarction) 06/28/2012   rt hemiparesis,  dysphagia  . Dendritic keratitis 2012  . Dysuria   . Edema 2003  . Esophageal dysmotility 08/19/2012  . Food impaction of esophagus 08/15/2012  . HTN (hypertension) 06/28/2012  . Hypertension   . Macular degeneration (senile) of retina, unspecified 2012  . Malignant neoplasm of breast (female), unspecified site 1970   S/P Rt radical mastectomy  . NSTEMI (non-ST elevated myocardial infarction) ([place]) 06/28/2012  . Osteoarthrosis, unspecified whether generalized or localized, unspecified site 2013  . Pacemaker   . Personal history of colonic polyps     s/p colonoscopy/polypectomy 2003  . Pure hyperglyceridemia 2004  . Senile osteoporosis 2003  . Stroke ([place]) 06/28/2012  . Tinnitus 2007  . Type II or unspecified type diabetes mellitus without mention of complication, not stated as uncontrolled 2003  . Unspecified arthropathy, pelvic region and thigh 2010  . Unspecified glaucoma(365.9) 2013  . Unspecified hypothyroidism 2009  . Unspecified vitamin D deficiency 2009  . Urinary retention 07/07/2012   Past Surgical History:  Procedure Laterality Date  . BOTOX INJECTION N/A 10/03/2012   Procedure: BOTOX INJECTION;  Surgeon: [name], MD;  Location: [facility];  Service: Endoscopy;  Laterality: N/A;  . ESOPHAGOGASTRODUODENOSCOPY N/A 08/15/2012   Procedure: ESOPHAGOGASTRODUODENOSCOPY (EGD);  Surgeon: [name], MD;  Location: [facility] ENDOSCOPY;  Service: Endoscopy;  Laterality: N/A;  . ESOPHAGOGASTRODUODENOSCOPY N/A 08/15/2012   Procedure: ESOPHAGOGASTRODUODENOSCOPY (EGD);  Surgeon: [name], MD;  Location: [place];  Service: Endoscopy;  Laterality: N/A;  . ESOPHAGOGASTRODUODENOSCOPY N/A 10/03/2012   Procedure: ESOPHAGOGASTRODUODENOSCOPY (EGD);  Surgeon: [name], MD;  Location: [facility];  Service: Endoscopy;  Laterality: N/A;  . ESOPHAGOGASTRODUODENOSCOPY N/A 12/22/2012   Procedure: ESOPHAGOGASTRODUODENOSCOPY (EGD);  Surgeon: [name], MD;  Location: [facility] ENDOSCOPY;  Service:  Endoscopy;  Laterality: N/A;  . ESOPHAGOSCOPY N/A 08/15/2012   Procedure: ESOPHAGOSCOPY;  Surgeon: [name], MD;  Location: [place];  Service: ENT;  Laterality: N/A;  . FOREIGN BODY REMOVAL ESOPHAGEAL N/A 08/15/2012   Procedure: REMOVAL FOREIGN BODY ESOPHAGEAL;  Surgeon: [name], MD;  Location: [place];  Service: ENT;  Laterality: N/A;  . INSERT / REPLACE / REMOVE PACEMAKER    . MASTECTOMY Right 1970   Cancer  . SAVORY DILATION N/A 10/03/2012   Procedure: SAVORY DILATION;  Surgeon: [name], MD;  [facility]: [facility];  Service: Endoscopy;  Laterality: N/A;    Allergies  Allergen Reactions  . Augmentin [Amoxicillin-Pot Clavulanate] Nausea And Vomiting  . Lactose Intolerance (Gi)     Allergies as of 05/22/2016      Reactions   Augmentin [amoxicillin-pot Clavulanate] Nausea And Vomiting   Lactose Intolerance (gi)       Medication List       Accurate as of 05/22/16  1:35 PM. Always use your most recent med list.          acetaminophen 650 MG CR tablet Commonly known as:  TYLENOL Take 650 mg by mouth 2 (two) times daily. For pain   BIOTENE DRY MOUTH CARE DT Place 1 spray onto teeth 4 (four) times daily.   carboxymethylcellulose 0.5 % Soln Commonly known as:  REFRESH PLUS 2 drops 2 (two) times daily.   dextromethorphan-guaiFENesin 30-600 MG 12hr tablet Commonly known as:  MUCINEX DM Take 1 tablet by mouth 2 (two) times daily.   escitalopram 20 MG tablet Commonly known as:  LEXAPRO Take 20 mg by mouth daily. For depression/ anxiety.   GLUCERNA Liqd Take 237 mLs by mouth. At breakfast and lunch   ipratropium-albuterol 0.5-2.5 (3) MG/3ML Soln Commonly known as:  DUONEB Take 3 mLs by nebulization every 6 (six) hours as needed (cough/wheezing).   levothyroxine 75 MCG tablet Commonly known as:  SYNTHROID, LEVOTHROID Take 75 mcg by mouth daily before breakfast. For thyroid therapy   liver oil-zinc oxide 40 % ointment Commonly known as:  DESITIN Apply 1 application  topically as needed for irritation.   loratadine 10 MG tablet Commonly known as:  CLARITIN Take 10 mg by mouth daily.   LORazepam 0.5 MG tablet Commonly known as:  ATIVAN Take 0.5 mg by mouth every 8 (eight) hours as needed for anxiety.   losartan 25 MG tablet Commonly known as:  COZAAR Take 1 tablet (25 mg total) by mouth daily.   Melatonin 5 MG Tabs Take 5 tablets by mouth at bedtime.   memantine 10 MG tablet Commonly known as:  NAMENDA Take 10 mg by mouth 2 (two) times daily.   metoprolol tartrate 25 MG tablet Commonly known as:  LOPRESSOR Take 25 mg by mouth 2 (two) times daily.   multivitamin tablet Take 1 tablet by mouth daily.   omeprazole 20 MG capsule Commonly known as:  PRILOSEC Take 20 mg by mouth daily.   PRESERVISION AREDS PO Take by mouth daily.   trolamine salicylate 10 % cream Commonly known as:  ASPERCREME Apply 1 application topically 2 (two) times daily as needed for muscle pain.   Witch Hazel 14 % Liqd Apply topically as needed (for hemorrhoid flare up).  Review of Systems  Immunization History  Administered Date(s) Administered  . Influenza Inj Mdck Quad Pf 12/22/2015  . Influenza Whole 12/17/2012  . Influenza-Unspecified 12/08/2013, 12/16/2014  . PPD Test 07/18/2012  . Pneumococcal Conjugate-13 08/27/2015   Pertinent  Health Maintenance Due  Topic Date Due  . URINE MICROALBUMIN  10/30/1931  . DEXA SCAN  10/30/1986  . HEMOGLOBIN A1C  03/21/2016  . PNA vac Low Risk Adult (2 of 2 - PCV13) 08/26/2016  . OPHTHALMOLOGY EXAM  10/25/2016  . FOOT EXAM  01/02/2017  . INFLUENZA VACCINE  Completed   Fall Risk  11/07/2015 01/24/2015  Falls in the past year? No No  Risk for fall due to : History of fall(s) -   Functional Status Survey:    Vitals:   05/22/16 1324  BP: 130/76  Pulse: 76  Resp: 18  Temp: 97.4 F (36.3 C)  TempSrc: Oral  SpO2: 95%   There is no height or weight on file to calculate BMI. Physical Exam  Labs  reviewed:  Recent Labs  09/19/15 03/26/16  NA 138 142  K 4.1 4.6  BUN 11 19  CREATININE 0.6 0.6    Recent Labs  09/19/15  AST 26  ALT 15  ALKPHOS 64    Recent Labs  09/19/15 10/20/15 03/26/16  WBC 11.7 8.8 8.5  HGB 10.6* 11.5* 12.2  HCT 34* 37 36  PLT 282 171 128*   Lab Results  Component Value Date   TSH 0.88 03/26/2016   Lab Results  Component Value Date   HGBA1C 6.4 09/19/2015   Lab Results  Component Value Date   CHOL 135 09/19/2015   HDL 50 09/19/2015   LDLCALC 55 09/19/2015   TRIG 149 09/19/2015   CHOLHDL 4.1 07/01/2012    Significant Diagnostic Results in last 30 days:  No results found.  Assessment/Plan There are no diagnoses linked to this encounter.   Family/ staff Communication: ***  Labs/tests ordered:  ***

## 2016-05-22 NOTE — Progress Notes (Signed)
Location:  [place] Room Number: 109 Place of Service:  SNF (31) Provider:  Leticia Coletta L. Mariea Clonts, D.O., C.M.D.  Hollace Kinnier, DO  Patient Care Team: [name], DO as PCP - General (Geriatric Medicine) [facility] Clarene Essex, MD as Attending Physician (Gastroenterology) Garvin Fila, MD as Consulting Physician (Neurology)  Extended Emergency Contact Information Primary Emergency Contact: [place] Address: [address]          [place], [place] [address] [name] of [place] Phone: [phone] Mobile Phone: [phone] Relation: Daughter  Code Status:  DNR Goals of care: Advanced Directive information Advanced Directives 05/22/2016  Does Patient Have a Medical Advance Directive? Yes  Type of Advance Directive Out of facility DNR (pink MOST or yellow form);[place];Living will  Does patient want to make changes to medical advance directive? -  Copy of [place] in Chart? Yes  Pre-existing out of facility DNR order (yellow form or pink MOST form) Yellow form placed in chart (order not valid for inpatient use)   Chief Complaint  Patient presents with  . Medical Management of Chronic Issues    routine visit    HPI:  Pt is a 83 y.o. female seen today for medical management of chronic diseases.   She resides in skilled care due to a hx of CVA with residual right sided weakness and vascular dementia. Unable to participate in bone density due to her mobility issues--uses wheelchair primarily.  Hx of dysphagia and esophageal motility currently consuming baby food without difficulty but does have a waiver to eat goldfish and chocolate. No recent bouts pna.    Dementia:  Started on Namenda 02/02/16 for memory loss, which seems to have tolerated well.  MMSE 27/30 09/08/15. Memory clearly declining when we meet with short term memory loss over course of visit worsening.      Weight gain:  2 D echo 2015 EF 55-60% Grade 1 DD significant AS noted but mean gradient only 9 mmhg.  Steady weight gain since August of 2017. Was 159 at that time, now 171 lbs. NO sob or chest pain. Normal TSH. Edema improved with compression hose.  Remote pacemaker transmission was done 05/07/16.  She was seen 05/14/16 by NP for edema of her right arm--this was felt to be due to her mastectomy in 1970 and she's had a stroke with right hemiparesis as well which has led to asymmetrical edema of her right side.  She was asking me about it like she had not already been advised that things are ok there and this is not truly new.   DMII:   Eye exam 8/17, foot exam 10/17, prevnar and pneumovax done. hba1c at goal in July 2017.  On ARB. No urine micro needed.  Due for updated hba1c.   Past Medical History:  Diagnosis Date  . Abnormality of gait 2007  . Acquired cyst of kidney 2002  . Acute sinus infection 11/25/2012  . Allergic rhinitis due to pollen   . Altered mental status 11/28/2012  . Anxiety state, unspecified   . Atrioventricular block, complete ([place]) 2003   s/p PPM 2003, generator chnage 2011  . Cardiac pacemaker in situ 2003  . Coronary atherosclerosis of native coronary artery 2003   non obstructive by Cath 2003  . CVA (cerebral infarction) 06/28/2012   rt hemiparesis, dysphagia  . Dendritic keratitis 2012  . Dysuria   . Edema 2003  . Esophageal dysmotility 08/19/2012  .  Food impaction of esophagus 08/15/2012  . HTN (hypertension) 06/28/2012  . Hypertension   . Macular degeneration (senile) of retina, unspecified 2012  . Malignant neoplasm of breast (female), unspecified site 1970   S/P Rt radical mastectomy  . NSTEMI (non-ST elevated myocardial infarction) ([place]) 06/28/2012  . Osteoarthrosis, unspecified whether generalized or localized, unspecified site 2013  . Pacemaker   . Personal history of colonic polyps     s/p colonoscopy/polypectomy 2003  . Pure hyperglyceridemia 2004  .  Senile osteoporosis 2003  . Stroke ([place]) 06/28/2012  . Tinnitus 2007  . Type II or unspecified type diabetes mellitus without mention of complication, not stated as uncontrolled 2003  . Unspecified arthropathy, pelvic region and thigh 2010  . Unspecified glaucoma(365.9) 2013  . Unspecified hypothyroidism 2009  . Unspecified vitamin D deficiency 2009  . Urinary retention 07/07/2012   Past Surgical History:  Procedure Laterality Date  . BOTOX INJECTION N/A 10/03/2012   Procedure: BOTOX INJECTION;  Surgeon: [name], MD;  Location: [facility];  Service: Endoscopy;  Laterality: N/A;  . ESOPHAGOGASTRODUODENOSCOPY N/A 08/15/2012   Procedure: ESOPHAGOGASTRODUODENOSCOPY (EGD);  Surgeon: [name], MD;  Location: [facility] ENDOSCOPY;  Service: Endoscopy;  Laterality: N/A;  . ESOPHAGOGASTRODUODENOSCOPY N/A 08/15/2012   Procedure: ESOPHAGOGASTRODUODENOSCOPY (EGD);  Surgeon: [name], MD;  Location: [place];  Service: Endoscopy;  Laterality: N/A;  . ESOPHAGOGASTRODUODENOSCOPY N/A 10/03/2012   Procedure: ESOPHAGOGASTRODUODENOSCOPY (EGD);  Surgeon: [name], MD;  Location: [facility];  Service: Endoscopy;  Laterality: N/A;  . ESOPHAGOGASTRODUODENOSCOPY N/A 12/22/2012   Procedure: ESOPHAGOGASTRODUODENOSCOPY (EGD);  Surgeon: [name], MD;  Location: [facility] ENDOSCOPY;  Service: Endoscopy;  Laterality: N/A;  . ESOPHAGOSCOPY N/A 08/15/2012   Procedure: ESOPHAGOSCOPY;  Surgeon: [name], MD;  Location: [place];  Service: ENT;  Laterality: N/A;  . FOREIGN BODY REMOVAL ESOPHAGEAL N/A 08/15/2012   Procedure: REMOVAL FOREIGN BODY ESOPHAGEAL;  Surgeon: [name], MD;  Location: [place];  Service: ENT;  Laterality: N/A;  . INSERT / REPLACE / REMOVE PACEMAKER    . MASTECTOMY Right 1970   Cancer  . SAVORY DILATION N/A 10/03/2012   Procedure: SAVORY DILATION;  Surgeon: [name], MD;  Location: [facility];  Service: Endoscopy;  Laterality: N/A;    Allergies  Allergen Reactions  . Augmentin [Amoxicillin-Pot  Clavulanate] Nausea And Vomiting  . Lactose Intolerance (Gi)     Allergies as of 05/22/2016      Reactions   Augmentin [amoxicillin-pot Clavulanate] Nausea And Vomiting   Lactose Intolerance (gi)       Medication List       Accurate as of 05/22/16  1:31 PM. Always use your most recent med list.          acetaminophen 650 MG CR tablet Commonly known as:  TYLENOL Take 650 mg by mouth 2 (two) times daily. For pain   BIOTENE DRY MOUTH CARE DT Place 1 spray onto teeth 4 (four) times daily.   carboxymethylcellulose 0.5 % Soln Commonly known as:  REFRESH PLUS 2 drops 2 (two) times daily.   escitalopram 20 MG tablet Commonly known as:  LEXAPRO Take 20 mg by mouth daily. For depression/ anxiety.   GLUCERNA Liqd Take 237 mLs by mouth. At breakfast and lunch   ipratropium-albuterol 0.5-2.5 (3) MG/3ML Soln Commonly known as:  DUONEB Take 3 mLs by nebulization every 6 (six) hours as needed (cough/wheezing).   levothyroxine 75 MCG tablet Commonly known as:  SYNTHROID, LEVOTHROID Take 75 mcg by mouth  daily before breakfast. For thyroid therapy   liver oil-zinc oxide 40 % ointment Commonly known as:  DESITIN Apply 1 application topically as needed for irritation.   loratadine 10 MG tablet Commonly known as:  CLARITIN Take 10 mg by mouth daily.   LORazepam 0.5 MG tablet Commonly known as:  ATIVAN Take 0.5 mg by mouth every 8 (eight) hours as needed for anxiety.   losartan 25 MG tablet Commonly known as:  COZAAR Take 1 tablet (25 mg total) by mouth daily.   Melatonin 5 MG Tabs Take 5 tablets by mouth at bedtime.   memantine 10 MG tablet Commonly known as:  NAMENDA Take 10 mg by mouth 2 (two) times daily.   metoprolol tartrate 25 MG tablet Commonly known as:  LOPRESSOR Take 25 mg by mouth 2 (two) times daily.   multivitamin tablet Take 1 tablet by mouth daily.   omeprazole 20 MG capsule Commonly known as:  PRILOSEC Take 20 mg by mouth daily.   PRESERVISION  AREDS PO Take by mouth daily.   trolamine salicylate 10 % cream Commonly known as:  ASPERCREME Apply 1 application topically 2 (two) times daily as needed for muscle pain.   Witch Hazel 14 % Liqd Apply topically as needed (for hemorrhoid flare up).       Review of Systems  Constitutional: Negative for chills, fever and malaise/fatigue.  HENT: Positive for hearing loss.   Eyes: Negative for blurred vision.       Glasses  Respiratory: Negative for cough and shortness of breath.   Cardiovascular: Positive for leg swelling. Negative for chest pain and palpitations.  Gastrointestinal: Negative for abdominal pain, blood in stool, constipation and melena.  Genitourinary: Negative for dysuria.  Musculoskeletal: Negative for falls.  Skin: Negative for rash.  Neurological: Negative for dizziness, loss of consciousness and weakness.  Psychiatric/Behavioral: Positive for memory loss.    Immunization History  Administered Date(s) Administered  . Influenza Inj Mdck Quad Pf 12/22/2015  . Influenza Whole 12/17/2012  . Influenza-Unspecified 12/08/2013, 12/16/2014  . PPD Test 07/18/2012  . Pneumococcal Conjugate-13 08/27/2015   Pertinent  Health Maintenance Due  Topic Date Due  . URINE MICROALBUMIN  10/30/1931  . DEXA SCAN  10/30/1986  . HEMOGLOBIN A1C  03/21/2016  . PNA vac Low Risk Adult (2 of 2 - PCV13) 08/26/2016  . OPHTHALMOLOGY EXAM  10/25/2016  . FOOT EXAM  01/02/2017  . INFLUENZA VACCINE  Completed   Fall Risk  11/07/2015 01/24/2015  Falls in the past year? No No  Risk for fall due to : History of fall(s) -   Functional Status Survey:    Vitals:   05/22/16 1324  BP: 130/76  Pulse: 76  Resp: 18  Temp: 97.4 F (36.3 C)  TempSrc: Oral  SpO2: 95%   There is no height or weight on file to calculate BMI. Physical Exam  Constitutional: She appears well-developed and well-nourished. No distress.  Cardiovascular: Normal rate, regular rhythm, normal heart sounds and intact  distal pulses.   Edema managed with compression hose  Pulmonary/Chest: Effort normal and breath sounds normal. She has no rales.  Abdominal: Soft. Bowel sounds are normal. She exhibits no distension. There is no tenderness.  Musculoskeletal:  Right hemiparesis s/p cva  Neurological: She is alert.  Oriented to person, place, not time, forgets things from the beginning of the visit by the end  Skin: Skin is warm and dry. Capillary refill takes less than 2 seconds.  Psychiatric: She has a normal  mood and affect.    Labs reviewed:  Recent Labs  09/19/15 03/26/16  NA 138 142  K 4.1 4.6  BUN 11 19  CREATININE 0.6 0.6    Recent Labs  09/19/15  AST 26  ALT 15  ALKPHOS 64    Recent Labs  09/19/15 10/20/15 03/26/16  WBC 11.7 8.8 8.5  HGB 10.6* 11.5* 12.2  HCT 34* 37 36  PLT 282 171 128*   Lab Results  Component Value Date   TSH 0.88 03/26/2016   Lab Results  Component Value Date   HGBA1C 6.4 09/19/2015   Lab Results  Component Value Date   CHOL 135 09/19/2015   HDL 50 09/19/2015   LDLCALC 55 09/19/2015   TRIG 149 09/19/2015   CHOLHDL 4.1 07/01/2012    Assessment/Plan 1. Chronic venous insufficiency -cont to elevate feet at rest and use compression hose daily  2. Vascular dementia without behavioral disturbance -cont namenda which she has tolerated well -cont assist with adls except feeding self  3. Controlled type 2 diabetes mellitus with complication, without long-term current use of insulin (HCC) -cont ARB, not on statin therapy due to age of 39, qol goals, has not required any medication for her DM and had gi bleeding before so asa contraindicated  4. Esophageal dysmotility -cont current diet as in hpi and aspiration precautions, pt gets upset when we discuss this each time, but has had serious impactions when diet too liberalized  5. Lymphedema of right arm--h/o mastectomy in distant past -cont to elevate at rest, can have massage through OT if desired,  avoid use of this arm for bps  6. Osteoarthritis, unspecified osteoarthritis type, unspecified site -cont use of tylenol bid and aspercreme bid prn to affected joints  7. Acquired hypothyroidism -cont current levothyroxine 29mcg daily separate from vitamins as much as possible Lab Results  Component Value Date   TSH 0.88 03/26/2016   Family/ staff Communication: Discussed with nursing staff.    Labs/tests ordered:  hba1c, tdap   Brylin Stanislawski L. Harryette Shuart, D.O. [facility] [address], [place] [address] Cell Phone (Mon-Fri 8am-5pm):  [phone] On Call:  [phone] & follow prompts after 5pm & weekends Office Phone:  [phone] Office Fax:  [phone]

## 2016-05-23 DIAGNOSIS — E119 Type 2 diabetes mellitus without complications: Secondary | ICD-10-CM | POA: Diagnosis not present

## 2016-05-23 DIAGNOSIS — R7309 Other abnormal glucose: Secondary | ICD-10-CM | POA: Diagnosis not present

## 2016-05-23 LAB — HEMOGLOBIN A1C: Hemoglobin A1C: 7.7

## 2016-06-12 DIAGNOSIS — E1159 Type 2 diabetes mellitus with other circulatory complications: Secondary | ICD-10-CM | POA: Diagnosis not present

## 2016-06-12 DIAGNOSIS — L602 Onychogryphosis: Secondary | ICD-10-CM | POA: Diagnosis not present

## 2016-06-12 DIAGNOSIS — L84 Corns and callosities: Secondary | ICD-10-CM | POA: Diagnosis not present

## 2016-06-22 ENCOUNTER — Non-Acute Institutional Stay (SKILLED_NURSING_FACILITY): Payer: Medicare Other | Admitting: Adult Health

## 2016-06-22 DIAGNOSIS — K224 Dyskinesia of esophagus: Secondary | ICD-10-CM

## 2016-06-22 DIAGNOSIS — I872 Venous insufficiency (chronic) (peripheral): Secondary | ICD-10-CM

## 2016-06-22 DIAGNOSIS — I679 Cerebrovascular disease, unspecified: Secondary | ICD-10-CM | POA: Diagnosis not present

## 2016-06-22 DIAGNOSIS — I442 Atrioventricular block, complete: Secondary | ICD-10-CM

## 2016-06-22 DIAGNOSIS — F0150 Vascular dementia without behavioral disturbance: Secondary | ICD-10-CM | POA: Diagnosis not present

## 2016-06-22 DIAGNOSIS — I890 Lymphedema, not elsewhere classified: Secondary | ICD-10-CM | POA: Diagnosis not present

## 2016-06-22 DIAGNOSIS — E118 Type 2 diabetes mellitus with unspecified complications: Secondary | ICD-10-CM | POA: Diagnosis not present

## 2016-06-26 ENCOUNTER — Encounter: Payer: Self-pay | Admitting: Adult Health

## 2016-06-26 NOTE — Progress Notes (Signed)
Location:  Occupational psychologist of Service:  SNF (31) Provider:   Cindi Carbon, ANP [place] [phone]   REED, Jonelle Sidle, DO  Patient Care Team: [name], DO as PCP - General (Geriatric Medicine) [facility] Clarene Essex, MD as Attending Physician (Gastroenterology) Garvin Fila, MD as Consulting Physician (Neurology)  Extended Emergency Contact Information Primary Emergency Contact: [place] Address: [address]          [place], [place] [address] [name] of [place] Phone: [phone] Mobile Phone: [phone] Relation: Daughter  Code Status:  DNR Goals of care: Advanced Directive information Advanced Directives 06/26/2016  Does Patient Have a Medical Advance Directive? Yes  Type of Advance Directive Out of facility DNR (pink MOST or yellow form);[place];Living will  Does patient want to make changes to medical advance directive? -  Copy of [place] in Chart? Yes  Pre-existing out of facility DNR order (yellow form or pink MOST form) Yellow form placed in chart (order not valid for inpatient use)     Chief Complaint  Patient presents with  . Medical Management of Chronic Issues    HPI:  Pt is a 83 y.o. female seen today for medical management of chronic diseases.  Resides in skilled care due to previous CVA with right sided weakness left parietal corona radiata/centrum semiovale infarct. Infarct etiology likley small vessel disease. Patient with resultant right hemiparesis and short-term memory loss. uses a hoyer lift for mobility.  Wt Readings from Last 3 Encounters:  06/22/16 171 lb (77.6 kg)  05/14/16 171 lb 1.6 oz (77.6 kg)  04/12/16 172 lb (78 kg)  Her weight is stable over the past few months but has previously gained weight over the past year due to increased intake and sedentary lifestyle. I have been asked to see her for increased edema  in her legs when she doesn't wear compression hose and now increased edema to her right arm. She has a previous hx of mastectomy on the right side. She does not like to wear the compression sleeve I ordered for her arm so it was discontinued.  No signs of SOB, JVD, or chest pain. We have not used diuretics yet as she is prone to dehydration and already complains of frequent urination at night.  Last echo in 2015 showed EF 55-60%, Grade 1 DD, moderate aortic stenosis She has short term memory loss and uses Namenda for vascular dementia. Resident has pacemaker, remote check in epic 05/07/16 Family refused Tdap ordered at last visit A1C is rising, now 7.7.  She eats goldfish and chocolate regularly (against medical advice, done for quality of life), otherwise she is on baby food due to hx of esophageal impaction. Has not had any issues with choking per staff. CBGS 129-161 in the am  Past Medical History:  Diagnosis Date  . Abnormality of gait 2007  . Acquired cyst of kidney 2002  . Acute sinus infection 11/25/2012  . Allergic rhinitis due to pollen   . Altered mental status 11/28/2012  . Anxiety state, unspecified   . Atrioventricular block, complete ([place]) 2003   s/p PPM 2003, generator chnage 2011  . Cardiac pacemaker in situ 2003  . Coronary atherosclerosis of native coronary artery 2003   non obstructive by Cath 2003  . CVA (cerebral infarction) 06/28/2012   rt hemiparesis, dysphagia  . Dendritic keratitis 2012  . Dysuria   . Edema 2003  . Esophageal dysmotility  08/19/2012  . Food impaction of esophagus 08/15/2012  . HTN (hypertension) 06/28/2012  . Hypertension   . Macular degeneration (senile) of retina, unspecified 2012  . Malignant neoplasm of breast (female), unspecified site 1970   S/P Rt radical mastectomy  . NSTEMI (non-ST elevated myocardial infarction) ([place]) 06/28/2012  . Osteoarthrosis, unspecified whether generalized or localized, unspecified site 2013  . Pacemaker   . Personal  history of colonic polyps     s/p colonoscopy/polypectomy 2003  . Pure hyperglyceridemia 2004  . Senile osteoporosis 2003  . Stroke ([place]) 06/28/2012  . Tinnitus 2007  . Type II or unspecified type diabetes mellitus without mention of complication, not stated as uncontrolled 2003  . Unspecified arthropathy, pelvic region and thigh 2010  . Unspecified glaucoma(365.9) 2013  . Unspecified hypothyroidism 2009  . Unspecified vitamin D deficiency 2009  . Urinary retention 07/07/2012   Past Surgical History:  Procedure Laterality Date  . BOTOX INJECTION N/A 10/03/2012   Procedure: BOTOX INJECTION;  Surgeon: [name], MD;  Location: [facility] ENDOSCOPY;  Service: Endoscopy;  Laterality: N/A;  . ESOPHAGOGASTRODUODENOSCOPY N/A 08/15/2012   Procedure: ESOPHAGOGASTRODUODENOSCOPY (EGD);  Surgeon: [name], MD;  Location: [facility] ENDOSCOPY;  Service: Endoscopy;  Laterality: N/A;  . ESOPHAGOGASTRODUODENOSCOPY N/A 08/15/2012   Procedure: ESOPHAGOGASTRODUODENOSCOPY (EGD);  Surgeon: [name], MD;  Location: [place];  Service: Endoscopy;  Laterality: N/A;  . ESOPHAGOGASTRODUODENOSCOPY N/A 10/03/2012   Procedure: ESOPHAGOGASTRODUODENOSCOPY (EGD);  Surgeon: [name], MD;  Location: [facility];  Service: Endoscopy;  Laterality: N/A;  . ESOPHAGOGASTRODUODENOSCOPY N/A 12/22/2012   Procedure: ESOPHAGOGASTRODUODENOSCOPY (EGD);  Surgeon: [name], MD;  Location: [facility] ENDOSCOPY;  Service: Endoscopy;  Laterality: N/A;  . ESOPHAGOSCOPY N/A 08/15/2012   Procedure: ESOPHAGOSCOPY;  Surgeon: [name], MD;  Location: [facility];  Service: ENT;  Laterality: N/A;  . FOREIGN BODY REMOVAL ESOPHAGEAL N/A 08/15/2012   Procedure: REMOVAL FOREIGN BODY ESOPHAGEAL;  Surgeon: [name], MD;  Location: [facility];  Service: ENT;  Laterality: N/A;  . INSERT / REPLACE / REMOVE PACEMAKER    . MASTECTOMY Right 1970   Cancer  . SAVORY DILATION N/A 10/03/2012   Procedure: SAVORY DILATION;  Surgeon: [name], MD;  Location: [facility] ENDOSCOPY;   Service: Endoscopy;  Laterality: N/A;    Allergies  Allergen Reactions  . Augmentin [Amoxicillin-Pot Clavulanate] Nausea And Vomiting  . Lactose Intolerance (Gi)     Outpatient Encounter Prescriptions as of 06/22/2016  Medication Sig  . acetaminophen (TYLENOL) 650 MG CR tablet Take 650 mg by mouth 2 (two) times daily. For pain  . carboxymethylcellulose (REFRESH PLUS) 0.5 % SOLN 2 drops 2 (two) times daily.   . Dentifrices (BIOTENE DRY MOUTH CARE DT) Place 1 spray onto teeth 4 (four) times daily.  Marland Kitchen dextromethorphan-guaiFENesin (MUCINEX DM) 30-600 MG 12hr tablet Take 1 tablet by mouth 2 (two) times daily.  Marland Kitchen escitalopram (LEXAPRO) 20 MG tablet Take 20 mg by mouth daily. For depression/ anxiety.  Marland Kitchen GLUCERNA (GLUCERNA) LIQD Take 1 Can by mouth 2 (two) times daily between meals. At breakfast and lunch 1 can divided between b/f and lunch  . ipratropium-albuterol (DUONEB) 0.5-2.5 (3) MG/3ML SOLN Take 3 mLs by nebulization every 6 (six) hours as needed (cough/wheezing).  Marland Kitchen levothyroxine (SYNTHROID, LEVOTHROID) 75 MCG tablet Take 75 mcg by mouth daily before breakfast. For thyroid therapy  . liver oil-zinc oxide (DESITIN) 40 % ointment Apply 1 application topically as needed for irritation.  Marland Kitchen loratadine (CLARITIN) 10 MG tablet Take 10  mg by mouth daily.   Marland Kitchen LORazepam (ATIVAN) 0.5 MG tablet Take 0.5 mg by mouth every 8 (eight) hours as needed for anxiety.  Marland Kitchen losartan (COZAAR) 25 MG tablet Take 1 tablet (25 mg total) by mouth daily.  . Melatonin 5 MG TABS Take 5 tablets by mouth at bedtime.  . memantine (NAMENDA) 10 MG tablet Take 10 mg by mouth 2 (two) times daily.  . metoprolol tartrate (LOPRESSOR) 25 MG tablet Take 25 mg by mouth 2 (two) times daily.  . Multiple Vitamin (MULTIVITAMIN) tablet Take 1 tablet by mouth daily.  . Multiple Vitamins-Minerals (PRESERVISION AREDS PO) Take by mouth daily.  Marland Kitchen omeprazole (PRILOSEC) 20 MG capsule Take 20 mg by mouth daily.  Marland Kitchen trolamine salicylate  (ASPERCREME) 10 % cream Apply 1 application topically 2 (two) times daily as needed for muscle pain.  Addison Lank Hazel 14 % LIQD Apply topically as needed (for hemorrhoid flare up).   No facility-administered encounter medications on file as of 06/22/2016.     Review of Systems  Constitutional: Negative for activity change, appetite change, chills, diaphoresis, fatigue, fever and unexpected weight change.  HENT: Positive for trouble swallowing. Negative for congestion.   Respiratory: Negative for cough, shortness of breath and wheezing.   Cardiovascular: Positive for leg swelling. Negative for chest pain and palpitations.  Gastrointestinal: Negative for abdominal distention, abdominal pain, constipation and diarrhea.  Genitourinary: Negative for difficulty urinating and dysuria.  Musculoskeletal: Positive for arthralgias and gait problem. Negative for back pain, joint swelling and myalgias.  Neurological: Positive for weakness. Negative for dizziness, tremors, seizures, syncope, facial asymmetry, speech difficulty, light-headedness, numbness and headaches.  Psychiatric/Behavioral: Positive for confusion. Negative for agitation, behavioral problems and sleep disturbance.    Immunization History  Administered Date(s) Administered  . Influenza Inj Mdck Quad Pf 12/22/2015  . Influenza Whole 12/17/2012  . Influenza-Unspecified 12/08/2013, 12/16/2014  . PPD Test 07/18/2012  . Pneumococcal Conjugate-13 08/27/2015   Pertinent  Health Maintenance Due  Topic Date Due  . HEMOGLOBIN A1C  03/21/2016  . PNA vac Low Risk Adult (2 of 2 - PPSV23) 08/26/2016  . INFLUENZA VACCINE  10/03/2016  . OPHTHALMOLOGY EXAM  10/25/2016  . FOOT EXAM  01/02/2017   Fall Risk  11/07/2015 01/24/2015  Falls in the past year? No No  Risk for fall due to : History of fall(s) -   Functional Status Survey:    Vitals:   06/22/16 1202  Weight: 171 lb (77.6 kg)   Body mass index is 32.31 kg/m. Physical Exam    Constitutional: She is oriented to person, place, and time. No distress.  HENT:  Head: Normocephalic and atraumatic.  Neck: No JVD present.  Cardiovascular: Normal rate and regular rhythm.   No murmur heard. BLE +2  Pulmonary/Chest: Effort normal and breath sounds normal. No respiratory distress. She has no wheezes.  Abdominal: Soft. Bowel sounds are normal. She exhibits no distension.  Musculoskeletal: She exhibits edema (Right arm, +CMS).  Neurological: She is alert and oriented to person, place, and time.  Skin: Skin is warm and dry. She is not diaphoretic.  Psychiatric: She has a normal mood and affect.    Labs reviewed:  Recent Labs  09/19/15 03/26/16  NA 138 142  K 4.1 4.6  BUN 11 19  CREATININE 0.6 0.6    Recent Labs  09/19/15  AST 26  ALT 15  ALKPHOS 64    Recent Labs  09/19/15 10/20/15 03/26/16  WBC 11.7 8.8 8.5  HGB 10.6*  11.5* 12.2  HCT 34* 37 36  PLT 282 171 128*   Lab Results  Component Value Date   TSH 0.88 03/26/2016   Lab Results  Component Value Date   HGBA1C 7.7 05/23/2016   Lab Results  Component Value Date   CHOL 135 09/19/2015   HDL 50 09/19/2015   LDLCALC 55 09/19/2015   TRIG 149 09/19/2015   CHOLHDL 4.1 07/01/2012    Significant Diagnostic Results in last 30 days:  No results found.  Assessment/Plan  1. Controlled type 2 diabetes mellitus with complication, without long-term current use of insulin ([place]) I have asked the dietician to work with resident on any small changes that can be made in her diet to help with her blood sugars and fluid retention.  We have hesitated to be too restrictive due to the fact she has a reduced quality of life and is on a baby food diet which is not desirable to her. Possibly she could cut back on condiments, glucerna, or the chocolate.   Goal A1C <8 given her advanced age. She is at goal but numbers are rising.   2. Esophageal dysmotility Continue baby food diet with asp prec  3.  Cerebrovascular disease No longer on antiplatelet therapy due to hx of heme pos stool and dropping Hgb (no additional work up due to age) BP controlled with Cozaar No lipid panel due to age/debility/goals of care  4. Atrioventricular block, complete [facility]) s/p pacemaker Recent check performed last month  5. Lymphedema of arm Recommend elevation as she will not wear compression sleeve  6. Vascular dementia without behavioral disturbance Continue Namenda 10 mg BID  7. Chronic venous insufficiency Continue compression stockings and elevation  Family/ staff Communication: discussed with resident and dietician  Labs/tests ordered:  NA

## 2016-06-27 ENCOUNTER — Non-Acute Institutional Stay (SKILLED_NURSING_FACILITY): Payer: Medicare Other

## 2016-06-27 DIAGNOSIS — Z0000 Encounter for general adult medical examination without abnormal findings: Secondary | ICD-10-CM

## 2016-06-27 NOTE — Patient Instructions (Signed)
Rachel Cooke , Thank you for taking time to come for your Medicare Wellness Visit. I appreciate your ongoing commitment to your health goals. Please review the following plan we discussed and let me know if I can assist you in the future.   Screening recommendations/referrals: Colonoscopy up to date Mammogram up to date Bone Density up to date Recommended yearly ophthalmology/optometry visit for glaucoma screening and checkup Recommended yearly dental visit for hygiene and checkup  Vaccinations: Influenza vaccine up to date Pneumococcal vaccine up to date.  Tdap vaccine due.  Shingles vaccine not in records. If you decide you want Shingrix we will put in order.    Advanced directives: In Chart  Conditions/risks identified: None  Next appointment: None upcoming   Preventive Care 65 Years and Older, Female Preventive care refers to lifestyle choices and visits with your health care provider that can promote health and wellness. What does preventive care include?  A yearly physical exam. This is also called an annual well check.  Dental exams once or twice a year.  Routine eye exams. Ask your health care provider how often you should have your eyes checked.  Personal lifestyle choices, including:  Daily care of your teeth and gums.  Regular physical activity.  Eating a healthy diet.  Avoiding tobacco and drug use.  Limiting alcohol use.  Practicing safe sex.  Taking low-dose aspirin every day.  Taking vitamin and mineral supplements as recommended by your health care provider. What happens during an annual well check? The services and screenings done by your health care provider during your annual well check will depend on your age, overall health, lifestyle risk factors, and family history of disease. Counseling  Your health care provider may ask you questions about your:  Alcohol use.  Tobacco use.  Drug use.  Emotional well-being.  Home and relationship  well-being.  Sexual activity.  Eating habits.  History of falls.  Memory and ability to understand (cognition).  Work and work Statistician.  Reproductive health. Screening  You may have the following tests or measurements:  Height, weight, and BMI.  Blood pressure.  Lipid and cholesterol levels. These may be checked every 5 years, or more frequently if you are over 66 years old.  Skin check.  Lung cancer screening. You may have this screening every year starting at age 73 if you have a 30-pack-year history of smoking and currently smoke or have quit within the past 15 years.  Fecal occult blood test (FOBT) of the stool. You may have this test every year starting at age 48.  Flexible sigmoidoscopy or colonoscopy. You may have a sigmoidoscopy every 5 years or a colonoscopy every 10 years starting at age 17.  Hepatitis C blood test.  Hepatitis B blood test.  Sexually transmitted disease (STD) testing.  Diabetes screening. This is done by checking your blood sugar (glucose) after you have not eaten for a while (fasting). You may have this done every 1-3 years.  Bone density scan. This is done to screen for osteoporosis. You may have this done starting at age 67.  Mammogram. This may be done every 1-2 years. Talk to your health care provider about how often you should have regular mammograms. Talk with your health care provider about your test results, treatment options, and if necessary, the need for more tests. Vaccines  Your health care provider may recommend certain vaccines, such as:  Influenza vaccine. This is recommended every year.  Tetanus, diphtheria, and acellular pertussis (Tdap, Td)  vaccine. You may need a Td booster every 10 years.  Zoster vaccine. You may need this after age 34.  Pneumococcal 13-valent conjugate (PCV13) vaccine. One dose is recommended after age 17.  Pneumococcal polysaccharide (PPSV23) vaccine. One dose is recommended after age  36. Talk to your health care provider about which screenings and vaccines you need and how often you need them. This information is not intended to replace advice given to you by your health care provider. Make sure you discuss any questions you have with your health care provider. Document Released: 03/18/2015 Document Revised: 11/09/2015 Document Reviewed: 12/21/2014 Elsevier Interactive Patient Education  2017 [place] Prevention in the Home Falls can cause injuries. They can happen to people of all ages. There are many things you can do to make your home safe and to help prevent falls. What can I do on the outside of my home?  Regularly fix the edges of walkways and driveways and fix any cracks.  Remove anything that might make you trip as you walk through a door, such as a raised step or threshold.  Trim any bushes or trees on the path to your home.  Use bright outdoor lighting.  Clear any walking paths of anything that might make someone trip, such as rocks or tools.  Regularly check to see if handrails are loose or broken. Make sure that both sides of any steps have handrails.  Any raised decks and porches should have guardrails on the edges.  Have any leaves, snow, or ice cleared regularly.  Use sand or salt on walking paths during winter.  Clean up any spills in your garage right away. This includes oil or grease spills. What can I do in the bathroom?  Use night lights.  Install grab bars by the toilet and in the tub and shower. Do not use towel bars as grab bars.  Use non-skid mats or decals in the tub or shower.  If you need to sit down in the shower, use a plastic, non-slip stool.  Keep the floor dry. Clean up any water that spills on the floor as soon as it happens.  Remove soap buildup in the tub or shower regularly.  Attach bath mats securely with double-sided non-slip rug tape.  Do not have throw rugs and other things on the floor that can make  you trip. What can I do in the bedroom?  Use night lights.  Make sure that you have a light by your bed that is easy to reach.  Do not use any sheets or blankets that are too big for your bed. They should not hang down onto the floor.  Have a firm chair that has side arms. You can use this for support while you get dressed.  Do not have throw rugs and other things on the floor that can make you trip. What can I do in the kitchen?  Clean up any spills right away.  Avoid walking on wet floors.  Keep items that you use a lot in easy-to-reach places.  If you need to reach something above you, use a strong step stool that has a grab bar.  Keep electrical cords out of the way.  Do not use floor polish or wax that makes floors slippery. If you must use wax, use non-skid floor wax.  Do not have throw rugs and other things on the floor that can make you trip. What can I do with my stairs?  Do not leave  any items on the stairs.  Make sure that there are handrails on both sides of the stairs and use them. Fix handrails that are broken or loose. Make sure that handrails are as long as the stairways.  Check any carpeting to make sure that it is firmly attached to the stairs. Fix any carpet that is loose or worn.  Avoid having throw rugs at the top or bottom of the stairs. If you do have throw rugs, attach them to the floor with carpet tape.  Make sure that you have a light switch at the top of the stairs and the bottom of the stairs. If you do not have them, ask someone to add them for you. What else can I do to help prevent falls?  Wear shoes that:  Do not have high heels.  Have rubber bottoms.  Are comfortable and fit you well.  Are closed at the toe. Do not wear sandals.  If you use a stepladder:  Make sure that it is fully opened. Do not climb a closed stepladder.  Make sure that both sides of the stepladder are locked into place.  Ask someone to hold it for you, if  possible.  Clearly mark and make sure that you can see:  Any grab bars or handrails.  First and last steps.  Where the edge of each step is.  Use tools that help you move around (mobility aids) if they are needed. These include:  Canes.  Walkers.  Scooters.  Crutches.  Turn on the lights when you go into a dark area. Replace any light bulbs as soon as they burn out.  Set up your furniture so you have a clear path. Avoid moving your furniture around.  If any of your floors are uneven, fix them.  If there are any pets around you, be aware of where they are.  Review your medicines with your doctor. Some medicines can make you feel dizzy. This can increase your chance of falling. Ask your doctor what other things that you can do to help prevent falls. This information is not intended to replace advice given to you by your health care provider. Make sure you discuss any questions you have with your health care provider. Document Released: 12/16/2008 Document Revised: 07/28/2015 Document Reviewed: 03/26/2014 Elsevier Interactive Patient Education  2017 [employer].

## 2016-06-27 NOTE — Progress Notes (Signed)
   I reviewed health advisor's note, was available for consultation and agree with the assessment and plan as written.  hba1c was done in march and 7.7 which is at goal for her age and functional status.  Will plan on tdap at next visit if pt and daughter agree.  Keenan Trefry L. Mionna Advincula, D.O. [facility] [address], [place] [address] Cell Phone (Mon-Fri 8am-5pm):  [phone] On Call:  [phone] & follow prompts after 5pm & weekends Office Phone:  [phone] Office Fax:  [phone]   Quick Notes   Health Maintenance: TDAP & HgA1C due.     Abnormal Screen: MMSE 23/30. Passed clock drawing.     Patient Concerns: None     Nurse Concerns: None

## 2016-06-27 NOTE — Progress Notes (Signed)
Subjective:   Rachel Cooke is a 83 y.o. female who presents for an Initial Medicare Annual Wellness Visit at [facility].   Cardiac Risk Factors include: family history of premature cardiovascular disease;diabetes mellitus;hypertension;obesity (BMI >30kg/m2);sedentary lifestyle;smoking/ tobacco exposure     Objective:    Today's Vitals   06/27/16 1254  BP: 133/77  Pulse: 84  Temp: 97.9 F (36.6 C)  TempSrc: Oral  SpO2: 94%  Weight: 171 lb (77.6 kg)  Height: 5\' 1"  (1.549 m)   Body mass index is 32.31 kg/m.   Current Medications (verified) Outpatient Encounter Prescriptions as of 06/27/2016  Medication Sig  . acetaminophen (TYLENOL) 650 MG CR tablet Take 650 mg by mouth 2 (two) times daily. For pain  . carboxymethylcellulose (REFRESH PLUS) 0.5 % SOLN 2 drops 2 (two) times daily.   . Dentifrices (BIOTENE DRY MOUTH CARE DT) Place 1 spray onto teeth 4 (four) times daily.  Marland Kitchen dextromethorphan-guaiFENesin (MUCINEX DM) 30-600 MG 12hr tablet Take 1 tablet by mouth 2 (two) times daily as needed.   Marland Kitchen escitalopram (LEXAPRO) 20 MG tablet Take 20 mg by mouth daily. For depression/ anxiety.  Marland Kitchen GLUCERNA (GLUCERNA) LIQD Take 1 Can by mouth 2 (two) times daily between meals. At breakfast and lunch 1 can divided between b/f and lunch  . ipratropium-albuterol (DUONEB) 0.5-2.5 (3) MG/3ML SOLN Take 3 mLs by nebulization every 6 (six) hours as needed (cough/wheezing).  Marland Kitchen levothyroxine (SYNTHROID, LEVOTHROID) 75 MCG tablet Take 75 mcg by mouth daily before breakfast. For thyroid therapy  . liver oil-zinc oxide (DESITIN) 40 % ointment Apply 1 application topically as needed for irritation.  Marland Kitchen loratadine (CLARITIN) 10 MG tablet Take 10 mg by mouth daily.   Marland Kitchen LORazepam (ATIVAN) 0.5 MG tablet Take 0.5 mg by mouth every 8 (eight) hours as needed for anxiety.  Marland Kitchen losartan (COZAAR) 25 MG tablet Take 1 tablet (25 mg total) by mouth daily.  . Melatonin 5 MG TABS Take 5 tablets by mouth at bedtime.  .  memantine (NAMENDA) 10 MG tablet Take 10 mg by mouth 2 (two) times daily.  . metoprolol tartrate (LOPRESSOR) 25 MG tablet Take 25 mg by mouth 2 (two) times daily.  . Multiple Vitamin (MULTIVITAMIN) tablet Take 1 tablet by mouth daily.  . Multiple Vitamins-Minerals (PRESERVISION AREDS PO) Take by mouth daily.  Marland Kitchen omeprazole (PRILOSEC) 20 MG capsule Take 20 mg by mouth daily.  Marland Kitchen trolamine salicylate (ASPERCREME) 10 % cream Apply 1 application topically 2 (two) times daily as needed for muscle pain.  Addison Lank Hazel 14 % LIQD Apply topically as needed (for hemorrhoid flare up).   No facility-administered encounter medications on file as of 06/27/2016.     Allergies (verified) Augmentin [amoxicillin-pot clavulanate] and Lactose intolerance (gi)   History: Past Medical History:  Diagnosis Date  . Abnormality of gait 2007  . Acquired cyst of kidney 2002  . Acute sinus infection 11/25/2012  . Allergic rhinitis due to pollen   . Altered mental status 11/28/2012  . Anxiety state, unspecified   . Atrioventricular block, complete ([place]) 2003   s/p PPM 2003, generator chnage 2011  . Cardiac pacemaker in situ 2003  . Coronary atherosclerosis of native coronary artery 2003   non obstructive by Cath 2003  . CVA (cerebral infarction) 06/28/2012   rt hemiparesis, dysphagia  . Dendritic keratitis 2012  . Dysuria   . Edema 2003  . Esophageal dysmotility 08/19/2012  . Food impaction of esophagus 08/15/2012  . HTN (hypertension) 06/28/2012  .  Hypertension   . Macular degeneration (senile) of retina, unspecified 2012  . Malignant neoplasm of breast (female), unspecified site 1970   S/P Rt radical mastectomy  . NSTEMI (non-ST elevated myocardial infarction) ([place]) 06/28/2012  . Osteoarthrosis, unspecified whether generalized or localized, unspecified site 2013  . Pacemaker   . Personal history of colonic polyps     s/p colonoscopy/polypectomy 2003  . Pure hyperglyceridemia 2004  . Senile osteoporosis 2003    . Stroke ([place]) 06/28/2012  . Tinnitus 2007  . Type II or unspecified type diabetes mellitus without mention of complication, not stated as uncontrolled 2003  . Unspecified arthropathy, pelvic region and thigh 2010  . Unspecified glaucoma(365.9) 2013  . Unspecified hypothyroidism 2009  . Unspecified vitamin D deficiency 2009  . Urinary retention 07/07/2012   Past Surgical History:  Procedure Laterality Date  . BOTOX INJECTION N/A 10/03/2012   Procedure: BOTOX INJECTION;  Surgeon: [name], MD;  Location: [facility] ENDOSCOPY;  Service: Endoscopy;  Laterality: N/A;  . ESOPHAGOGASTRODUODENOSCOPY N/A 08/15/2012   Procedure: ESOPHAGOGASTRODUODENOSCOPY (EGD);  Surgeon: [name], MD;  Location: [facility] ENDOSCOPY;  Service: Endoscopy;  Laterality: N/A;  . ESOPHAGOGASTRODUODENOSCOPY N/A 08/15/2012   Procedure: ESOPHAGOGASTRODUODENOSCOPY (EGD);  Surgeon: [name], MD;  Location: [place];  Service: Endoscopy;  Laterality: N/A;  . ESOPHAGOGASTRODUODENOSCOPY N/A 10/03/2012   Procedure: ESOPHAGOGASTRODUODENOSCOPY (EGD);  Surgeon: [name], MD;  Location: [facility];  Service: Endoscopy;  Laterality: N/A;  . ESOPHAGOGASTRODUODENOSCOPY N/A 12/22/2012   Procedure: ESOPHAGOGASTRODUODENOSCOPY (EGD);  Surgeon: [name], MD;  Location: [facility] ENDOSCOPY;  Service: Endoscopy;  Laterality: N/A;  . ESOPHAGOSCOPY N/A 08/15/2012   Procedure: ESOPHAGOSCOPY;  Surgeon: [name], MD;  Location: [place];  Service: ENT;  Laterality: N/A;  . FOREIGN BODY REMOVAL ESOPHAGEAL N/A 08/15/2012   Procedure: REMOVAL FOREIGN BODY ESOPHAGEAL;  Surgeon: [name], MD;  Location: [place];  Service: ENT;  Laterality: N/A;  . INSERT / REPLACE / REMOVE PACEMAKER    . MASTECTOMY Right 1970   Cancer  . SAVORY DILATION N/A 10/03/2012   Procedure: SAVORY DILATION;  Surgeon: [name], MD;  Location: [facility] ENDOS[facility];  Service: Endoscopy;  Laterality: N/A;   Family History  Problem Relation Age of Onset  . Hypertension Father    Social  History   Occupational History  . retired    Social History Main Topics  . Smoking status: Former Smoker    Packs/day: 0.25    Years: 3.00  . Smokeless tobacco: Never Used  . Alcohol use No  . Drug use: No  . Sexual activity: No    Tobacco Counseling Counseling given: Not Answered   Activities of Daily Living In your present state of health, do you have any difficulty performing the following activities: 06/27/2016  Hearing? N  Vision? Y  Difficulty concentrating or making decisions? N  Walking or climbing stairs? Y  Dressing or bathing? Y  Doing errands, shopping? Y  Preparing Food and eating ? Y  Using the Toilet? Y  In the past six months, have you accidently leaked urine? Y  Do you have problems with loss of bowel control? Y  Managing your Medications? Y  Managing your Finances? Y  Housekeeping or managing your Housekeeping? Y  Some recent data might be hidden    Immunizations and Health Maintenance Immunization History  Administered Date(s) Administered  . Influenza Inj Mdck Quad Pf 12/22/2015  . Influenza Whole 12/17/2012  . Influenza-Unspecified 12/08/2013, 12/16/2014  . PPD Test  07/18/2012  . Pneumococcal Conjugate-13 08/27/2015   Health Maintenance Due  Topic Date Due  . TETANUS/TDAP  10/29/1938    Patient Care Team: [name], DO as PCP - General (Geriatric Medicine) Well [facility] Clarene Essex, MD as Attending Physician (Gastroenterology) Garvin Fila, MD as Consulting Physician (Neurology)  Indicate any recent Medical Services you may have received from other than [facility] providers in the past year (date may be approximate).     Assessment:   This is a routine wellness examination for [facility].   Hearing/Vision screen No exam data present  Dietary issues and exercise activities discussed: Current Exercise Habits: Home exercise routine, Type of exercise: Other - see comments (standing up 20 times), Time (Minutes): 20,  Frequency (Times/Week): 4, Weekly Exercise (Minutes/Week): 80, Intensity: Mild, Exercise limited by: None identified  Goals    . Increase water intake          Starting 06/27/2016 I will increase my water by a glass a day.       Depression Screen PHQ 2/9 Scores 06/27/2016 11/07/2015 01/24/2015  PHQ - 2 Score 0 2 0  PHQ- 9 Score - 3 -    Fall Risk Fall Risk  06/27/2016 11/07/2015 01/24/2015  Falls in the past year? No No No  Risk for fall due to : - History of fall(s) -    Cognitive Function: MMSE - Mini Mental State Exam 06/27/2016  Orientation to time 4  Orientation to Place 3  Registration 3  Attention/ Calculation 5  Recall 0  Language- name 2 objects 2  Language- repeat 1  Language- follow 3 step command 2  Language- read & follow direction 1  Write a sentence 1  Copy design 1  Total score 23        Screening Tests Health Maintenance  Topic Date Due  . TETANUS/TDAP  10/29/1938  . PNA vac Low Risk Adult (2 of 2 - PPSV23) 08/26/2016  . INFLUENZA VACCINE  10/03/2016  . OPHTHALMOLOGY EXAM  10/25/2016  . HEMOGLOBIN A1C  11/23/2016  . FOOT EXAM  01/02/2017      Plan:    I have personally reviewed and addressed the Medicare Annual Wellness questionnaire and have noted the following in the patient's chart:  A. Medical and social history B. Use of alcohol, tobacco or illicit drugs  C. Current medications and supplements D. Functional ability and status E.  Nutritional status F.  Physical activity G. Advance directives H. List of other physicians I.  Hospitalizations, surgeries, and ER visits in previous 12 months J.  [place] to include hearing, vision, cognitive, depression L. Referrals and appointments - none  In addition, I have reviewed and discussed with patient certain preventive protocols, quality metrics, and best practice recommendations. A written personalized care plan for preventive services as well as general preventive health  recommendations were provided to patient.  See attached scanned questionnaire for additional information.   Signed,   Rich Reining, RN Nurse Health Advisor

## 2016-07-01 NOTE — Progress Notes (Deleted)
Patient ID: Rachel Cooke, female   DOB: 1920-01-11, 83 y.o.   MRN: [id number]  Location:  [place] Room Number: 109 Place of Service:  SNF (31) Provider:  Hollace Kinnier, DO  Patient Care Team: [name], DO as PCP - General (Geriatric Medicine) Well [facility] Clarene Essex, MD as Attending Physician (Gastroenterology) Garvin Fila, MD as Consulting Physician (Neurology)  Extended Emergency Contact Information Primary Emergency Contact: [place] Address: [address]          [place], [place] [address] [name] of [place] Phone: [phone] Mobile Phone: [phone] Relation: Daughter  Code Status:  *** Goals of care: Advanced Directive information Advanced Directives 06/27/2016  Does Patient Have a Medical Advance Directive? Yes  Type of Paramedic of [place];Living will;Out of facility DNR (pink MOST or yellow form)  Does patient want to make changes to medical advance directive? No - Patient declined  Copy of [place] in Chart? Yes  Pre-existing out of facility DNR order (yellow form or pink MOST form) Yellow form placed in chart (order not valid for inpatient use)     Chief Complaint  Patient presents with  . Medical Management of Chronic Issues    routine visit    HPI:  Pt is a 83 y.o. female seen today for medical management of chronic diseases.     Past Medical History:  Diagnosis Date  . Abnormality of gait 2007  . Acquired cyst of kidney 2002  . Acute sinus infection 11/25/2012  . Allergic rhinitis due to pollen   . Altered mental status 11/28/2012  . Anxiety state, unspecified   . Atrioventricular block, complete ([place]) 2003   s/p PPM 2003, generator chnage 2011  . Cardiac pacemaker in situ 2003  . Coronary atherosclerosis of native coronary artery 2003   non obstructive by Cath 2003  . CVA (cerebral infarction) 06/28/2012   rt  hemiparesis, dysphagia  . Dendritic keratitis 2012  . Dysuria   . Edema 2003  . Esophageal dysmotility 08/19/2012  . Food impaction of esophagus 08/15/2012  . HTN (hypertension) 06/28/2012  . Hypertension   . Macular degeneration (senile) of retina, unspecified 2012  . Malignant neoplasm of breast (female), unspecified site 1970   S/P Rt radical mastectomy  . NSTEMI (non-ST elevated myocardial infarction) ([place]) 06/28/2012  . Osteoarthrosis, unspecified whether generalized or localized, unspecified site 2013  . Pacemaker   . Personal history of colonic polyps     s/p colonoscopy/polypectomy 2003  . Pure hyperglyceridemia 2004  . Senile osteoporosis 2003  . Stroke ([place]) 06/28/2012  . Tinnitus 2007  . Type II or unspecified type diabetes mellitus without mention of complication, not stated as uncontrolled 2003  . Unspecified arthropathy, pelvic region and thigh 2010  . Unspecified glaucoma(365.9) 2013  . Unspecified hypothyroidism 2009  . Unspecified vitamin D deficiency 2009  . Urinary retention 07/07/2012   Past Surgical History:  Procedure Laterality Date  . BOTOX INJECTION N/A 10/03/2012   Procedure: BOTOX INJECTION;  Surgeon: [name], MD;  [facility];  Service: Endoscopy;  Laterality: N/A;  . ESOPHAGOGASTRODUODENOSCOPY N/A 08/15/2012   Procedure: ESOPHAGOGASTRODUODENOSCOPY (EGD);  Surgeon: [name], MD;  Location: [facility] ENDOSCOPY;  Service: Endoscopy;  Laterality: N/A;  . ESOPHAGOGASTRODUODENOSCOPY N/A 08/15/2012   Procedure: ESOPHAGOGASTRODUODENOSCOPY (EGD);  Surgeon: [name], MD;  Location: [place];  Service: Endoscopy;  Laterality: N/A;  . ESOPHAGOGASTRODUODENOSCOPY N/A 10/03/2012  Procedure: ESOPHAGOGASTRODUODENOSCOPY (EGD);  Surgeon: [name], MD;  Location: [facility];  Service: Endoscopy;  Laterality: N/A;  . ESOPHAGOGASTRODUODENOSCOPY N/A 12/22/2012   Procedure: ESOPHAGOGASTRODUODENOSCOPY (EGD);  Surgeon: [name], MD;  Location: [facility] ENDOSCOPY;   Service: Endoscopy;  Laterality: N/A;  . ESOPHAGOSCOPY N/A 08/15/2012   Procedure: ESOPHAGOSCOPY;  Surgeon: [name], MD;  Location: [place];  Service: ENT;  Laterality: N/A;  . FOREIGN BODY REMOVAL ESOPHAGEAL N/A 08/15/2012   Procedure: REMOVAL FOREIGN BODY ESOPHAGEAL;  Surgeon: [name], MD;  Location: [place];  Service: ENT;  Laterality: N/A;  . INSERT / REPLACE / REMOVE PACEMAKER    . MASTECTOMY Right 1970   Cancer  . SAVORY DILATION N/A 10/03/2012   Procedure: SAVORY DILATION;  Surgeon: [name], MD;  [facility];  Service: Endoscopy;  Laterality: N/A;    Allergies  Allergen Reactions  . Augmentin [Amoxicillin-Pot Clavulanate] Nausea And Vomiting  . Lactose Intolerance (Gi)     Allergies as of 05/22/2016      Reactions   Augmentin [amoxicillin-pot Clavulanate] Nausea And Vomiting   Lactose Intolerance (gi)       Medication List       Accurate as of 05/22/16 11:59 PM. Always use your most recent med list.          acetaminophen 650 MG CR tablet Commonly known as:  TYLENOL Take 650 mg by mouth 2 (two) times daily. For pain   BIOTENE DRY MOUTH CARE DT Place 1 spray onto teeth 4 (four) times daily.   carboxymethylcellulose 0.5 % Soln Commonly known as:  REFRESH PLUS 2 drops 2 (two) times daily.   dextromethorphan-guaiFENesin 30-600 MG 12hr tablet Commonly known as:  MUCINEX DM Take 1 tablet by mouth 2 (two) times daily as needed.   escitalopram 20 MG tablet Commonly known as:  LEXAPRO Take 20 mg by mouth daily. For depression/ anxiety.   GLUCERNA Liqd Take 1 Can by mouth 2 (two) times daily between meals. At breakfast and lunch 1 can divided between b/f and lunch   ipratropium-albuterol 0.5-2.5 (3) MG/3ML Soln Commonly known as:  DUONEB Take 3 mLs by nebulization every 6 (six) hours as needed (cough/wheezing).   levothyroxine 75 MCG tablet Commonly known as:  SYNTHROID, LEVOTHROID Take 75 mcg by mouth daily before breakfast. For thyroid therapy     liver oil-zinc oxide 40 % ointment Commonly known as:  DESITIN Apply 1 application topically as needed for irritation.   loratadine 10 MG tablet Commonly known as:  CLARITIN Take 10 mg by mouth daily.   LORazepam 0.5 MG tablet Commonly known as:  ATIVAN Take 0.5 mg by mouth every 8 (eight) hours as needed for anxiety.   losartan 25 MG tablet Commonly known as:  COZAAR Take 1 tablet (25 mg total) by mouth daily.   Melatonin 5 MG Tabs Take 5 tablets by mouth at bedtime.   memantine 10 MG tablet Commonly known as:  NAMENDA Take 10 mg by mouth 2 (two) times daily.   metoprolol tartrate 25 MG tablet Commonly known as:  LOPRESSOR Take 25 mg by mouth 2 (two) times daily.   multivitamin tablet Take 1 tablet by mouth daily.   omeprazole 20 MG capsule Commonly known as:  PRILOSEC Take 20 mg by mouth daily.   PRESERVISION AREDS PO Take by mouth daily.   trolamine salicylate 10 % cream Commonly known as:  ASPERCREME Apply 1 application topically 2 (two) times daily as needed for muscle pain.  Witch Hazel 14 % Liqd Apply topically as needed (for hemorrhoid flare up).       Review of Systems  Immunization History  Administered Date(s) Administered  . Influenza Inj Mdck Quad Pf 12/22/2015  . Influenza Whole 12/17/2012  . Influenza-Unspecified 12/08/2013, 12/16/2014  . PPD Test 07/18/2012  . Pneumococcal Conjugate-13 08/27/2015   Pertinent  Health Maintenance Due  Topic Date Due  . PNA vac Low Risk Adult (2 of 2 - PPSV23) 08/26/2016  . INFLUENZA VACCINE  10/03/2016  . OPHTHALMOLOGY EXAM  10/25/2016  . HEMOGLOBIN A1C  11/23/2016  . FOOT EXAM  01/02/2017   Fall Risk  06/27/2016 11/07/2015 01/24/2015  Falls in the past year? No No No  Risk for fall due to : - History of fall(s) -   Functional Status Survey:    Vitals:   05/22/16 1324  BP: 130/76  Pulse: 76  Resp: 18  Temp: 97.4 F (36.3 C)  TempSrc: Oral  SpO2: 95%   There is no height or weight on file  to calculate BMI. Physical Exam  Labs reviewed:  Recent Labs  09/19/15 03/26/16  NA 138 142  K 4.1 4.6  BUN 11 19  CREATININE 0.6 0.6    Recent Labs  09/19/15  AST 26  ALT 15  ALKPHOS 64    Recent Labs  09/19/15 10/20/15 03/26/16  WBC 11.7 8.8 8.5  HGB 10.6* 11.5* 12.2  HCT 34* 37 36  PLT 282 171 128*   Lab Results  Component Value Date   TSH 0.88 03/26/2016   Lab Results  Component Value Date   HGBA1C 7.7 05/23/2016   Lab Results  Component Value Date   CHOL 135 09/19/2015   HDL 50 09/19/2015   LDLCALC 55 09/19/2015   TRIG 149 09/19/2015   CHOLHDL 4.1 07/01/2012    Significant Diagnostic Results in last 30 days:  No results found.  Assessment/Plan There are no diagnoses linked to this encounter.   Family/ staff Communication: ***  Labs/tests ordered:  ***

## 2016-07-09 DIAGNOSIS — H353133 Nonexudative age-related macular degeneration, bilateral, advanced atrophic without subfoveal involvement: Secondary | ICD-10-CM | POA: Diagnosis not present

## 2016-07-09 LAB — HM DIABETES EYE EXAM

## 2016-07-19 ENCOUNTER — Non-Acute Institutional Stay (SKILLED_NURSING_FACILITY): Payer: Medicare Other | Admitting: Adult Health

## 2016-07-19 DIAGNOSIS — I10 Essential (primary) hypertension: Secondary | ICD-10-CM | POA: Diagnosis not present

## 2016-07-19 DIAGNOSIS — J302 Other seasonal allergic rhinitis: Secondary | ICD-10-CM | POA: Diagnosis not present

## 2016-07-19 DIAGNOSIS — E118 Type 2 diabetes mellitus with unspecified complications: Secondary | ICD-10-CM

## 2016-07-19 DIAGNOSIS — R635 Abnormal weight gain: Secondary | ICD-10-CM

## 2016-07-19 DIAGNOSIS — K224 Dyskinesia of esophagus: Secondary | ICD-10-CM | POA: Diagnosis not present

## 2016-07-19 NOTE — Progress Notes (Signed)
Location:  Occupational psychologist of Service:  SNF (31) Provider:   Cindi Carbon, ANP [place] [phone]   [name], DO  Patient Care Team: [name], DO as PCP - General (Geriatric Medicine) Community, [facility] [name], Altamese Dilling, MD as Attending Physician (Gastroenterology) Garvin Fila, MD as Consulting Physician (Neurology)  Extended Emergency Contact Information Primary Emergency Contact: [place] Address: [address]          [place], [place] [address] [name] of [place] Phone: [phone] Mobile Phone: [phone] Relation: Daughter  Code Status:  DNR Goals of care: Advanced Directive information Advanced Directives 07/24/2016  Does Patient Have a Medical Advance Directive? Yes  Type of Paramedic of [place];Living will;Out of facility DNR (pink MOST or yellow form)  Does patient want to make changes to medical advance directive? -  Copy of [place] in Chart? Yes  Pre-existing out of facility DNR order (yellow form or pink MOST form) Yellow form placed in chart (order not valid for inpatient use)     Chief Complaint  Patient presents with  . Medical Management of Chronic Issues    HPI:  Pt is a 83 y.o. female seen today for medical management of chronic diseases.  Resides in skilled care due to previous CVA with right sided weakness left parietal corona radiata/centrum semiovale infarct. Infarct etiology likley small vessel disease. Patient with resultant right hemiparesis and short-term memory loss. uses a hoyer lift for mobility.  Wt Readings from Last 3 Encounters:  07/24/16 174 lb 9.6 oz (79.2 kg)  06/27/16 171 lb (77.6 kg)  06/22/16 171 lb (77.6 kg)  Her weight continues to increase due to increase intake of chocolate, goldfish, desserts, and condiments on her food. She is on diet of baby food due toa hx of esophageal dysmotility with  impaction but makes concessions with other items not on the recommended diet for quality of life.   I have been asked to see her for increased edema in her legs when she doesn't wear compression hose and now increased edema to her right arm. She has a previous hx of mastectomy on the right side. She does not like to wear the compression sleeve I ordered for her arm so it was discontinued.  No signs of SOB, JVD, or chest pain. We have not used diuretics yet as she is prone to dehydration and already complains of frequent urination at night.  Last echo in 2015 showed EF 55-60%, Grade 1 DD, moderate aortic stenosis She has short term memory loss and uses Namenda for vascular dementia. Family refused Tdap ordered at last visit A1C is rising, now 7.7.  05/23/16 Up from 6.4 09/18/16 Allergic rhinitis: uses claritin daily, sneezes frequently  Past Medical History:  Diagnosis Date  . Abnormality of gait 2007  . Acquired cyst of kidney 2002  . Acute sinus infection 11/25/2012  . Allergic rhinitis due to pollen   . Altered mental status 11/28/2012  . Anxiety state, unspecified   . Atrioventricular block, complete ([place]) 2003   s/p PPM 2003, generator chnage 2011  . Cardiac pacemaker in situ 2003  . Coronary atherosclerosis of native coronary artery 2003   non obstructive by Cath 2003  . CVA (cerebral infarction) 06/28/2012   rt hemiparesis, dysphagia  . Dendritic keratitis 2012  . Dysuria   . Edema 2003  . Esophageal dysmotility 08/19/2012  . Food impaction of esophagus 08/15/2012  .  HTN (hypertension) 06/28/2012  . Hypertension   . Macular degeneration (senile) of retina, unspecified 2012  . Malignant neoplasm of breast (female), unspecified site 1970   S/P Rt radical mastectomy  . NSTEMI (non-ST elevated myocardial infarction) ([place]) 06/28/2012  . Osteoarthrosis, unspecified whether generalized or localized, unspecified site 2013  . Pacemaker   . Personal history of colonic polyps     s/p  colonoscopy/polypectomy 2003  . Pure hyperglyceridemia 2004  . Senile osteoporosis 2003  . Stroke ([place]) 06/28/2012  . Tinnitus 2007  . Type II or unspecified type diabetes mellitus without mention of complication, not stated as uncontrolled 2003  . Unspecified arthropathy, pelvic region and thigh 2010  . Unspecified glaucoma(365.9) 2013  . Unspecified hypothyroidism 2009  . Unspecified vitamin D deficiency 2009  . Urinary retention 07/07/2012   Past Surgical History:  Procedure Laterality Date  . BOTOX INJECTION N/A 10/03/2012   Procedure: BOTOX INJECTION;  Surgeon: [name], MD;  Location: [facility] ENDOSCOPY;  Service: Endoscopy;  Laterality: N/A;  . ESOPHAGOGASTRODUODENOSCOPY N/A 08/15/2012   Procedure: ESOPHAGOGASTRODUODENOSCOPY (EGD);  Surgeon: [name], MD;  Location: [facility] ENDOSCOPY;  Service: Endoscopy;  Laterality: N/A;  . ESOPHAGOGASTRODUODENOSCOPY N/A 08/15/2012   Procedure: ESOPHAGOGASTRODUODENOSCOPY (EGD);  Surgeon: [name], MD;  Location: [place];  Service: Endoscopy;  Laterality: N/A;  . ESOPHAGOGASTRODUODENOSCOPY N/A 10/03/2012   Procedure: ESOPHAGOGASTRODUODENOSCOPY (EGD);  Surgeon: [name], MD;  Location: [facility]OSCOPY;  Service: Endoscopy;  Laterality: N/A;  . ESOPHAGOGASTRODUODENOSCOPY N/A 12/22/2012   Procedure: ESOPHAGOGASTRODUODENOSCOPY (EGD);  Surgeon: [name], MD;  Location: [facility] ENDOSCOPY;  Service: Endoscopy;  Laterality: N/A;  . ESOPHAGOSCOPY N/A 08/15/2012   Procedure: ESOPHAGOSCOPY;  Surgeon: [name], MD;  Location: [place];  Service: ENT;  Laterality: N/A;  . FOREIGN BODY REMOVAL ESOPHAGEAL N/A 08/15/2012   Procedure: REMOVAL FOREIGN BODY ESOPHAGEAL;  Surgeon: [name], MD;  Location: [place];  Service: ENT;  Laterality: N/A;  . INSERT / REPLACE / REMOVE PACEMAKER    . MASTECTOMY Right 1970   Cancer  . SAVORY DILATION N/A 10/03/2012   Procedure: SAVORY DILATION;  Surgeon: [name], MD;  [facility]: [facility] ENDOSCOPY;  Service: Endoscopy;  Laterality: N/A;     Allergies  Allergen Reactions  . Augmentin [Amoxicillin-Pot Clavulanate] Nausea And Vomiting  . Lactose Intolerance (Gi)     Outpatient Encounter Prescriptions as of 07/19/2016  Medication Sig  . acetaminophen (TYLENOL) 650 MG CR tablet Take 650 mg by mouth 2 (two) times daily. For pain  . carboxymethylcellulose (REFRESH PLUS) 0.5 % SOLN 2 drops 2 (two) times daily.   . Dentifrices (BIOTENE DRY MOUTH CARE DT) Place 1 spray onto teeth 4 (four) times daily.  Marland Kitchen dextromethorphan-guaiFENesin (MUCINEX DM) 30-600 MG 12hr tablet Take 1 tablet by mouth 2 (two) times daily as needed.   Marland Kitchen escitalopram (LEXAPRO) 20 MG tablet Take 20 mg by mouth daily. For depression/ anxiety.  Marland Kitchen GLUCERNA (GLUCERNA) LIQD Take 1 Can by mouth 2 (two) times daily between meals. At breakfast and lunch 1 can divided between b/f and lunch  . ipratropium-albuterol (DUONEB) 0.5-2.5 (3) MG/3ML SOLN Take 3 mLs by nebulization every 6 (six) hours as needed (cough/wheezing).  Marland Kitchen levothyroxine (SYNTHROID, LEVOTHROID) 75 MCG tablet Take 75 mcg by mouth daily before breakfast. For thyroid therapy  . liver oil-zinc oxide (DESITIN) 40 % ointment Apply 1 application topically as needed for irritation.  Marland Kitchen loratadine (CLARITIN) 10 MG tablet Take 10 mg by mouth daily.   Marland Kitchen  LORazepam (ATIVAN) 0.5 MG tablet Take 0.5 mg by mouth daily as needed for anxiety.   Marland Kitchen losartan (COZAAR) 25 MG tablet Take 1 tablet (25 mg total) by mouth daily.  . Melatonin 5 MG TABS Take 5 tablets by mouth at bedtime.  . memantine (NAMENDA) 10 MG tablet Take 10 mg by mouth 2 (two) times daily.  . metoprolol tartrate (LOPRESSOR) 25 MG tablet Take 25 mg by mouth 2 (two) times daily.  . Multiple Vitamin (MULTIVITAMIN) tablet Take 1 tablet by mouth daily.  . Multiple Vitamins-Minerals (PRESERVISION AREDS PO) Take by mouth daily.  Marland Kitchen omeprazole (PRILOSEC) 20 MG capsule Take 20 mg by mouth daily.  Marland Kitchen trolamine salicylate (ASPERCREME) 10 % cream Apply 1 application topically  2 (two) times daily as needed for muscle pain.  Addison Lank Hazel 14 % LIQD Apply topically as needed (for hemorrhoid flare up).   No facility-administered encounter medications on file as of 07/19/2016.     Review of Systems  Constitutional: Negative for activity change, appetite change, chills, diaphoresis, fatigue, fever and unexpected weight change.  HENT: Positive for trouble swallowing. Negative for congestion.   Respiratory: Negative for cough, shortness of breath and wheezing.   Cardiovascular: Positive for leg swelling. Negative for chest pain and palpitations.  Gastrointestinal: Negative for abdominal distention, abdominal pain, constipation and diarrhea.  Genitourinary: Negative for difficulty urinating and dysuria.  Musculoskeletal: Positive for arthralgias and gait problem. Negative for back pain, joint swelling and myalgias.  Neurological: Positive for weakness. Negative for dizziness, tremors, seizures, syncope, facial asymmetry, speech difficulty, light-headedness, numbness and headaches.  Psychiatric/Behavioral: Positive for confusion. Negative for agitation, behavioral problems and sleep disturbance.    Immunization History  Administered Date(s) Administered  . Influenza Inj Mdck Quad Pf 12/22/2015  . Influenza Whole 12/17/2012  . Influenza-Unspecified 12/08/2013, 12/16/2014  . PPD Test 07/18/2012  . Pneumococcal Conjugate-13 08/27/2015   Pertinent  Health Maintenance Due  Topic Date Due  . PNA vac Low Risk Adult (2 of 2 - PPSV23) 08/26/2016  . INFLUENZA VACCINE  10/03/2016  . OPHTHALMOLOGY EXAM  10/25/2016  . HEMOGLOBIN A1C  11/23/2016  . FOOT EXAM  01/02/2017   Fall Risk  06/27/2016 11/07/2015 01/24/2015  Falls in the past year? No No No  Risk for fall due to : - History of fall(s) -   Functional Status Survey:    Vitals:   07/24/16 1021  BP: 105/66  Pulse: 86  Resp: 18  Temp: 97.7 F (36.5 C)  SpO2: 95%  Weight: 174 lb 9.6 oz (79.2 kg)   Body mass index  is 32.99 kg/m. Physical Exam  Constitutional: She is oriented to person, place, and time. No distress.  HENT:  Head: Normocephalic and atraumatic.  Neck: No JVD present.  Cardiovascular: Normal rate and regular rhythm.   No murmur heard. BLE +2  Pulmonary/Chest: Effort normal and breath sounds normal. No respiratory distress. She has no wheezes.  Abdominal: Soft. Bowel sounds are normal. She exhibits no distension.  Musculoskeletal: She exhibits edema (Right arm, +CMS).  Neurological: She is alert and oriented to person, place, and time.  Skin: Skin is warm and dry. She is not diaphoretic.  Psychiatric: She has a normal mood and affect.    Labs reviewed:  Recent Labs  09/19/15 03/26/16  NA 138 142  K 4.1 4.6  BUN 11 19  CREATININE 0.6 0.6    Recent Labs  09/19/15  AST 26  ALT 15  ALKPHOS 64    Recent  Labs  09/19/15 10/20/15 03/26/16  WBC 11.7 8.8 8.5  HGB 10.6* 11.5* 12.2  HCT 34* 37 36  PLT 282 171 128*   Lab Results  Component Value Date   TSH 0.88 03/26/2016   Lab Results  Component Value Date   HGBA1C 7.7 05/23/2016   Lab Results  Component Value Date   CHOL 135 09/19/2015   HDL 50 09/19/2015   LDLCALC 55 09/19/2015   TRIG 149 09/19/2015   CHOLHDL 4.1 07/01/2012    Significant Diagnostic Results in last 30 days:  No results found.  Assessment/Plan  1. Controlled type 2 diabetes mellitus with complication, without long-term current use of insulin ([place]) The dietician has met with her and her daughter and made small changes to her diet to help with the rising A1C and weight.  She is not willing to give up the chocolate/goldfish which is reasonable given her already limited diet. Goal A1C <8 given her advanced age. She is at goal but numbers are rising.   2. Esophageal dysmotility Continue baby food diet with asp prec She is at risk for impaction and aspiration as she deviates from her recommended diet but her family would not want anything  aggressive done if she became acutely ill  3.  Weight gain -Due to diet, ? CHF as well Continue to monitor weight and consider diuretic therapy but would hold off at this point since she frequently urinates and is incontinent and is not currently short of breath  4. Allergic rhinitis Continue Claritin 10 mg qd  5. HTN Controlled Continue Cozaar 25 mg qd  Family/ staff Communication: discussed with resident and dietician  Labs/tests ordered:  NA  Refused tetanus

## 2016-07-24 ENCOUNTER — Encounter: Payer: Self-pay | Admitting: Adult Health

## 2016-08-06 ENCOUNTER — Telehealth: Payer: Self-pay | Admitting: Cardiology

## 2016-08-06 ENCOUNTER — Ambulatory Visit (INDEPENDENT_AMBULATORY_CARE_PROVIDER_SITE_OTHER): Payer: Medicare Other | Admitting: *Deleted

## 2016-08-06 DIAGNOSIS — I442 Atrioventricular block, complete: Secondary | ICD-10-CM | POA: Diagnosis not present

## 2016-08-06 NOTE — Telephone Encounter (Signed)
Confirmed remote transmission w/ pt son.    

## 2016-08-07 NOTE — Progress Notes (Signed)
Remote pacemaker transmission.   

## 2016-08-08 LAB — CUP PACEART REMOTE DEVICE CHECK
Battery Remaining Longevity: 64 mo
Battery Voltage: 2.78 V
Brady Statistic AP VS Percent: 0 %
Date Time Interrogation Session: 20180604171659
Implantable Lead Implant Date: 20110506
Implantable Lead Location: 753859
Implantable Lead Location: 753860
Lead Channel Pacing Threshold Pulse Width: 0.4 ms
Lead Channel Pacing Threshold Pulse Width: 0.4 ms
Lead Channel Setting Pacing Amplitude: 2 V
Lead Channel Setting Pacing Pulse Width: 0.4 ms
Lead Channel Setting Sensing Sensitivity: 2.8 mV
MDC IDC LEAD IMPLANT DT: 20110506
MDC IDC MSMT BATTERY IMPEDANCE: 734 Ohm
MDC IDC MSMT LEADCHNL RA IMPEDANCE VALUE: 480 Ohm
MDC IDC MSMT LEADCHNL RA PACING THRESHOLD AMPLITUDE: 0.875 V
MDC IDC MSMT LEADCHNL RV IMPEDANCE VALUE: 407 Ohm
MDC IDC MSMT LEADCHNL RV PACING THRESHOLD AMPLITUDE: 1.125 V
MDC IDC PG IMPLANT DT: 20110506
MDC IDC SET LEADCHNL RV PACING AMPLITUDE: 2.5 V
MDC IDC STAT BRADY AP VP PERCENT: 0 %
MDC IDC STAT BRADY AS VP PERCENT: 100 %
MDC IDC STAT BRADY AS VS PERCENT: 0 %

## 2016-08-14 ENCOUNTER — Encounter: Payer: Self-pay | Admitting: Cardiology

## 2016-08-16 DIAGNOSIS — Z9011 Acquired absence of right breast and nipple: Secondary | ICD-10-CM | POA: Diagnosis not present

## 2016-08-20 ENCOUNTER — Encounter: Payer: Self-pay | Admitting: Adult Health

## 2016-08-20 ENCOUNTER — Non-Acute Institutional Stay (SKILLED_NURSING_FACILITY): Payer: Medicare Other | Admitting: Adult Health

## 2016-08-20 DIAGNOSIS — R635 Abnormal weight gain: Secondary | ICD-10-CM | POA: Diagnosis not present

## 2016-08-20 DIAGNOSIS — I890 Lymphedema, not elsewhere classified: Secondary | ICD-10-CM

## 2016-08-20 DIAGNOSIS — E118 Type 2 diabetes mellitus with unspecified complications: Secondary | ICD-10-CM | POA: Diagnosis not present

## 2016-08-20 NOTE — Progress Notes (Signed)
This encounter was created in error - please disregard.

## 2016-08-20 NOTE — Progress Notes (Signed)
Location:  Occupational psychologist of Service:  SNF (31) Provider:   Cindi Carbon, ANP [place] [phone]   [name], DO  Patient Care Team: [name], DO as PCP - General (Geriatric Medicine) Community, Well [name], Altamese Dilling, MD as Attending Physician (Gastroenterology) Garvin Fila, MD as Consulting Physician (Neurology)  Extended Emergency Contact Information Primary Emergency Contact: [place] Address: [address]          [place], [place] [address] [name] of [place] Phone: [phone] Mobile Phone: [phone] Relation: Daughter  Code Status:  DNR Goals of care: Advanced Directive information Advanced Directives 07/24/2016  Does Patient Have a Medical Advance Directive? Yes  Type of Paramedic of [place];Living will;Out of facility DNR (pink MOST or yellow form)  Does patient want to make changes to medical advance directive? -  Copy of [place] in Chart? Yes  Pre-existing out of facility DNR order (yellow form or pink MOST form) Yellow form placed in chart (order not valid for inpatient use)     Chief Complaint  Patient presents with  . Acute Visit    weight gain    HPI:  Pt is a 83 y.o. female seen today for an acute visit for weight gain. She resides in skilled care at [facility] due to previous hx of CVA with right sided weakness. Her weight continues to increase see below Wt Readings from Last 3 Encounters:  08/20/16 180 lb (81.6 kg)  07/24/16 174 lb 9.6 oz (79.2 kg)  06/27/16 171 lb (77.6 kg)  She denies any sob or chest pain. She does endorse fatigue and floating.  She has increased intake of chocolate, goldfish, desserts, and condiments on her food. She is on diet of baby food due toa hx of esophageal dysmotility with impaction but makes concessions with other items not on the recommended diet for quality of life.  She has  increased edema in her left arm that has been present for several months. We tried a compression sleeve but she ultimately refused it due to discomfort. This is the same side as her previous mastectomy and affected side from a CVA.  Last echo in 2015 showed EF 55-60%, Grade 1 DD, moderate aortic stenosis  A1C is rising, now 7.7.  05/23/16 Up from 6.4 09/18/16 The dietician has discussed diet changes with her but she has not made any meaningful changes.   Past Medical History:  Diagnosis Date  . Abnormality of gait 2007  . Acquired cyst of kidney 2002  . Acute sinus infection 11/25/2012  . Allergic rhinitis due to pollen   . Altered mental status 11/28/2012  . Anxiety state, unspecified   . Atrioventricular block, complete ([place]) 2003   s/p PPM 2003, generator chnage 2011  . Cardiac pacemaker in situ 2003  . Coronary atherosclerosis of native coronary artery 2003   non obstructive by Cath 2003  . CVA (cerebral infarction) 06/28/2012   rt hemiparesis, dysphagia  . Dendritic keratitis 2012  . Dysuria   . Edema 2003  . Esophageal dysmotility 08/19/2012  . Food impaction of esophagus 08/15/2012  . HTN (hypertension) 06/28/2012  . Hypertension   . Macular degeneration (senile) of retina, unspecified 2012  . Malignant neoplasm of breast (female), unspecified site 1970   S/P Rt radical mastectomy  . NSTEMI (non-ST elevated myocardial infarction) ([place]) 06/28/2012  . Osteoarthrosis, unspecified whether generalized or localized, unspecified site 2013  . Pacemaker   .  Personal history of colonic polyps     s/p colonoscopy/polypectomy 2003  . Pure hyperglyceridemia 2004  . Senile osteoporosis 2003  . Stroke ([place]) 06/28/2012  . Tinnitus 2007  . Type II or unspecified type diabetes mellitus without mention of complication, not stated as uncontrolled 2003  . Unspecified arthropathy, pelvic region and thigh 2010  . Unspecified glaucoma(365.9) 2013  . Unspecified hypothyroidism 2009  . Unspecified  vitamin D deficiency 2009  . Urinary retention 07/07/2012   Past Surgical History:  Procedure Laterality Date  . BOTOX INJECTION N/A 10/03/2012   Procedure: BOTOX INJECTION;  Surgeon: [name], MD;  [facility] ENDOS[facility];  Service: Endoscopy;  Laterality: N/A;  . ESOPHAGOGASTRODUODENOSCOPY N/A 08/15/2012   Procedure: ESOPHAGOGASTRODUODENOSCOPY (EGD);  Surgeon: [name], MD;  Location: [facility] ENDOSCOPY;  Service: Endoscopy;  Laterality: N/A;  . ESOPHAGOGASTRODUODENOSCOPY N/A 08/15/2012   Procedure: ESOPHAGOGASTRODUODENOSCOPY (EGD);  Surgeon: [name], MD;  Location: [place];  Service: Endoscopy;  Laterality: N/A;  . ESOPHAGOGASTRODUODENOSCOPY N/A 10/03/2012   Procedure: ESOPHAGOGASTRODUODENOSCOPY (EGD);  Surgeon: [name], MD;  Location: [facility];  Service: Endoscopy;  Laterality: N/A;  . ESOPHAGOGASTRODUODENOSCOPY N/A 12/22/2012   Procedure: ESOPHAGOGASTRODUODENOSCOPY (EGD);  Surgeon: [name], MD;  Location: [facility] ENDOSCOPY;  Service: Endoscopy;  Laterality: N/A;  . ESOPHAGOSCOPY N/A 08/15/2012   Procedure: ESOPHAGOSCOPY;  Surgeon: [name], MD;  Location: [facility];  Service: ENT;  Laterality: N/A;  . FOREIGN BODY REMOVAL ESOPHAGEAL N/A 08/15/2012   Procedure: REMOVAL FOREIGN BODY ESOPHAGEAL;  Surgeon: [name], MD;  Location: [facility];  Service: ENT;  Laterality: N/A;  . INSERT / REPLACE / REMOVE PACEMAKER    . MASTECTOMY Right 1970   Cancer  . SAVORY DILATION N/A 10/03/2012   Procedure: SAVORY DILATION;  Surgeon: [name], MD;  [facility] ENDOS[facility];  Service: Endoscopy;  Laterality: N/A;    Allergies  Allergen Reactions  . Augmentin [Amoxicillin-Pot Clavulanate] Nausea And Vomiting  . Lactose Intolerance (Gi)     Outpatient Encounter Prescriptions as of 08/20/2016  Medication Sig  . acetaminophen (TYLENOL) 650 MG CR tablet Take 650 mg by mouth 2 (two) times daily. For pain  . carboxymethylcellulose (REFRESH PLUS) 0.5 % SOLN 2 drops 2 (two) times daily.   . Dentifrices  (BIOTENE DRY MOUTH CARE DT) Place 1 spray onto teeth 4 (four) times daily.  Marland Kitchen dextromethorphan-guaiFENesin (MUCINEX DM) 30-600 MG 12hr tablet Take 1 tablet by mouth 2 (two) times daily as needed.   Marland Kitchen escitalopram (LEXAPRO) 20 MG tablet Take 20 mg by mouth daily. For depression/ anxiety.  Marland Kitchen GLUCERNA (GLUCERNA) LIQD Take 1 Can by mouth 2 (two) times daily between meals. At breakfast and lunch 1 can divided between b/f and lunch  . ipratropium-albuterol (DUONEB) 0.5-2.5 (3) MG/3ML SOLN Take 3 mLs by nebulization every 6 (six) hours as needed (cough/wheezing).  Marland Kitchen levothyroxine (SYNTHROID, LEVOTHROID) 75 MCG tablet Take 75 mcg by mouth daily before breakfast. For thyroid therapy  . liver oil-zinc oxide (DESITIN) 40 % ointment Apply 1 application topically as needed for irritation.  Marland Kitchen loratadine (CLARITIN) 10 MG tablet Take 10 mg by mouth daily.   Marland Kitchen LORazepam (ATIVAN) 0.5 MG tablet Take 0.5 mg by mouth daily as needed for anxiety.   Marland Kitchen losartan (COZAAR) 25 MG tablet Take 1 tablet (25 mg total) by mouth daily.  . Melatonin 5 MG TABS Take 5 tablets by mouth at bedtime.  . memantine (NAMENDA) 10 MG tablet Take 10 mg by mouth 2 (  two) times daily.  . metoprolol tartrate (LOPRESSOR) 25 MG tablet Take 25 mg by mouth 2 (two) times daily.  . Multiple Vitamin (MULTIVITAMIN) tablet Take 1 tablet by mouth daily.  . Multiple Vitamins-Minerals (PRESERVISION AREDS PO) Take by mouth daily.  Marland Kitchen omeprazole (PRILOSEC) 20 MG capsule Take 20 mg by mouth daily.  Marland Kitchen trolamine salicylate (ASPERCREME) 10 % cream Apply 1 application topically 2 (two) times daily as needed for muscle pain.  Addison Lank Hazel 14 % LIQD Apply topically as needed (for hemorrhoid flare up).   No facility-administered encounter medications on file as of 08/20/2016.     Review of Systems  Constitutional: Positive for fatigue. Negative for activity change, appetite change, chills, diaphoresis, fever and unexpected weight change.  HENT: Negative for  congestion.   Respiratory: Negative for cough, shortness of breath and wheezing.   Cardiovascular: Positive for leg swelling. Negative for chest pain and palpitations.  Gastrointestinal: Negative for abdominal distention, abdominal pain, constipation and diarrhea.  Endocrine: Negative for cold intolerance, heat intolerance, polydipsia, polyphagia and polyuria.  Genitourinary: Negative for difficulty urinating and dysuria.  Musculoskeletal: Positive for gait problem. Negative for arthralgias, back pain, joint swelling and myalgias.  Neurological: Positive for weakness. Negative for dizziness, tremors, seizures, syncope, facial asymmetry, speech difficulty, light-headedness, numbness and headaches.  Psychiatric/Behavioral: Negative for agitation, behavioral problems and confusion.       Memory loss    Immunization History  Administered Date(s) Administered  . Influenza Inj Mdck Quad Pf 12/22/2015  . Influenza Whole 12/17/2012  . Influenza-Unspecified 12/08/2013, 12/16/2014  . PPD Test 07/18/2012  . Pneumococcal Conjugate-13 08/27/2015   Pertinent  Health Maintenance Due  Topic Date Due  . PNA vac Low Risk Adult (2 of 2 - PPSV23) 08/26/2016  . INFLUENZA VACCINE  10/03/2016  . OPHTHALMOLOGY EXAM  10/25/2016  . HEMOGLOBIN A1C  11/23/2016  . FOOT EXAM  01/02/2017   Fall Risk  06/27/2016 11/07/2015 01/24/2015  Falls in the past year? No No No  Risk for fall due to : - History of fall(s) -   Functional Status Survey:    Vitals:   08/20/16 1415  BP: 133/73  Pulse: 80  Resp: 16  Temp: 97.6 F (36.4 C)  SpO2: 94%  Weight: 180 lb (81.6 kg)   Body mass index is 34.01 kg/m. Physical Exam  Constitutional: She is oriented to person, place, and time. No distress.  HENT:  Head: Normocephalic and atraumatic.  Neck: No JVD present.  Cardiovascular: Normal rate and regular rhythm.   No murmur heard. BLE edema +1. RUE edema +2, pos radial pulse. No tender, no redness or warmth    Pulmonary/Chest: Effort normal. No respiratory distress. She has no wheezes. She has rales (fine bibasilar).  Abdominal: Soft. Bowel sounds are normal. She exhibits no distension.  Lymphadenopathy:    She has no cervical adenopathy.  Neurological: She is alert and oriented to person, place, and time.  R sided hemiparesis.    Skin: Skin is warm and dry. No rash noted. She is not diaphoretic. No erythema.  No ulcerations to feet, BPPP. Sensation intact  Psychiatric: She has a normal mood and affect.    Labs reviewed:  Recent Labs  09/19/15 03/26/16  NA 138 142  K 4.1 4.6  BUN 11 19  CREATININE 0.6 0.6    Recent Labs  09/19/15  AST 26  ALT 15  ALKPHOS 64    Recent Labs  09/19/15 10/20/15 03/26/16  WBC 11.7 8.8 8.5  HGB 10.6* 11.5* 12.2  HCT 34* 37 36  PLT 282 171 128*   Lab Results  Component Value Date   TSH 0.88 03/26/2016   Lab Results  Component Value Date   HGBA1C 7.7 05/23/2016   Lab Results  Component Value Date   CHOL 135 09/19/2015   HDL 50 09/19/2015   LDLCALC 55 09/19/2015   TRIG 149 09/19/2015   CHOLHDL 4.1 07/01/2012    Significant Diagnostic Results in last 30 days:  No results found.  Assessment/Plan  1. Weight gain Could have underlying CHF, coupled with diet noncompliance, also immobile Lasix 20 mg with Kdur 10 meq qd x 3 days Weight 3 x week BMP on 6/21 Continue compression hose Diet changes encouraged.  2. Controlled type 2 diabetes mellitus with complication, without long-term current use of insulin ([place]) Encouraged her to reduce her chocolate intake Will recheck A1C next visit Foot exam performed Goal A1C <8%  3. Lymphedema of arm Now has some tenderness but could not reproduce on exam, will check doppler to rule out DVT Most likely the edema is due to disuse and previous mastectomy   Family/ staff Communication: discussed with staff  Labs/tests ordered:  NA

## 2016-08-21 DIAGNOSIS — M7989 Other specified soft tissue disorders: Secondary | ICD-10-CM | POA: Diagnosis not present

## 2016-08-23 DIAGNOSIS — D649 Anemia, unspecified: Secondary | ICD-10-CM | POA: Diagnosis not present

## 2016-08-23 LAB — BASIC METABOLIC PANEL
BUN: 17 (ref 4–21)
CREATININE: 0.6 (ref 0.5–1.1)
Glucose: 146
POTASSIUM: 4.6 (ref 3.4–5.3)
Sodium: 141 (ref 137–147)

## 2016-08-27 ENCOUNTER — Telehealth: Payer: Self-pay | Admitting: Nurse Practitioner

## 2016-08-27 NOTE — Telephone Encounter (Signed)
Left message for Rachel Cooke to call back.

## 2016-08-27 NOTE — Telephone Encounter (Signed)
New message    Pt daughter is calling asking for a call back about pt.

## 2016-08-28 ENCOUNTER — Non-Acute Institutional Stay (SKILLED_NURSING_FACILITY): Payer: Medicare Other | Admitting: Internal Medicine

## 2016-08-28 ENCOUNTER — Encounter: Payer: Self-pay | Admitting: Internal Medicine

## 2016-08-28 DIAGNOSIS — I890 Lymphedema, not elsewhere classified: Secondary | ICD-10-CM

## 2016-08-28 DIAGNOSIS — E118 Type 2 diabetes mellitus with unspecified complications: Secondary | ICD-10-CM | POA: Diagnosis not present

## 2016-08-28 DIAGNOSIS — R635 Abnormal weight gain: Secondary | ICD-10-CM | POA: Diagnosis not present

## 2016-08-28 DIAGNOSIS — F0150 Vascular dementia without behavioral disturbance: Secondary | ICD-10-CM

## 2016-08-28 DIAGNOSIS — I872 Venous insufficiency (chronic) (peripheral): Secondary | ICD-10-CM | POA: Diagnosis not present

## 2016-08-28 DIAGNOSIS — B002 Herpesviral gingivostomatitis and pharyngotonsillitis: Secondary | ICD-10-CM

## 2016-08-28 NOTE — Progress Notes (Signed)
Patient ID: ATLANTIS DELONG, female   DOB: Feb 20, 1920, 83 y.o.   MRN: [id number]  Location:  [facility] Room Number: [place] of Service:  SNF [phone]) Provider:   [name], DO  Patient Care Team: [name], DO as PCP - General (Geriatric Medicine) [name], Altamese Dilling, MD as Attending Physician (Gastroenterology) Garvin Fila, MD as Consulting Physician (Neurology)  Extended Emergency Contact Information Primary Emergency Contact: [place] Address: [address]          [place], [place] [address] [name] of [place] Phone: [phone] Mobile Phone: [phone] Relation: Daughter  Code Status:  DNR Goals of care: Advanced Directive information Advanced Directives 08/28/2016  Does Patient Have a Medical Advance Directive? Yes  Type of Advance Directive Out of facility DNR (pink MOST or yellow form);[place];Living will  Does patient want to make changes to medical advance directive? -  Copy of [place] in Chart? Yes  Pre-existing out of facility DNR order (yellow form or pink MOST form) Yellow form placed in chart (order not valid for inpatient use)     Chief Complaint  Patient presents with  . Medical Management of Chronic Issues    routine visit    HPI:  Pt is a 83 y.o. female seen today for medical management of chronic diseases.    Lymphedema right arm:  Has been going on for years, but recently having weight gain, swelling of bilateral arms and legs, distention of abdomen.  She has been given two courses of lasix to diurese her.  Her weight trended down the last time after a three day course, but has already gone back up 2 more lbs to 180.9 lbs.  She denies dyspnea.    She also has a herpetic lesion on her left upper lip.    She has had well-controlled diabetes without medications long term.   Lab Results  Component Value Date   HGBA1C  7.7 05/23/2016   She has had some degree of chronic venous insufficiency with compression hose use.  Edema has worsened in her feet and legs also.    Her cognition is gradually declining with short term memory loss.  She seems unfamiliar with me each visit a couple of months apart.  She retold the story of her lymphedema s/p mastectomy and lymph node dissection a second time today.    Past Medical History:  Diagnosis Date  . Abnormality of gait 2007  . Acquired cyst of kidney 2002  . Acute sinus infection 11/25/2012  . Allergic rhinitis due to pollen   . Altered mental status 11/28/2012  . Anxiety state, unspecified   . Atrioventricular block, complete ([place]) 2003   s/p PPM 2003, generator chnage 2011  . Cardiac pacemaker in situ 2003  . Coronary atherosclerosis of native coronary artery 2003   non obstructive by Cath 2003  . CVA (cerebral infarction) 06/28/2012   rt hemiparesis, dysphagia  . Dendritic keratitis 2012  . Dysuria   . Edema 2003  . Esophageal dysmotility 08/19/2012  . Food impaction of esophagus 08/15/2012  . HTN (hypertension) 06/28/2012  . Hypertension   . Macular degeneration (senile) of retina, unspecified 2012  . Malignant neoplasm of breast (female), unspecified site 1970   S/P Rt radical mastectomy  . NSTEMI (non-ST elevated myocardial infarction) ([place]) 06/28/2012  . Osteoarthrosis, unspecified whether generalized or localized, unspecified site 2013  . Pacemaker   . Personal history of  colonic polyps     s/p colonoscopy/polypectomy 2003  . Pure hyperglyceridemia 2004  . Senile osteoporosis 2003  . Stroke ([place]) 06/28/2012  . Tinnitus 2007  . Type II or unspecified type diabetes mellitus without mention of complication, not stated as uncontrolled 2003  . Unspecified arthropathy, pelvic region and thigh 2010  . Unspecified glaucoma(365.9) 2013  . Unspecified hypothyroidism 2009  . Unspecified vitamin D deficiency 2009  . Urinary retention 07/07/2012   Past  Surgical History:  Procedure Laterality Date  . BOTOX INJECTION N/A 10/03/2012   Procedure: BOTOX INJECTION;  Surgeon: [name], MD;  Location: [facility] ENDOSCOPY;  Service: Endoscopy;  Laterality: N/A;  . ESOPHAGOGASTRODUODENOSCOPY N/A 08/15/2012   Procedure: ESOPHAGOGASTRODUODENOSCOPY (EGD);  Surgeon: [name], MD;  [facility] END[facility]COPY;  Service: Endoscopy;  Laterality: N/A;  . ESOPHAGOGASTRODUODENOSCOPY N/A 08/15/2012   Procedure: ESOPHAGOGASTRODUODENOSCOPY (EGD);  Surgeon: [name], MD;  Location: [facility];  Service: Endoscopy;  Laterality: N/A;  . ESOPHAGOGASTRODUODENOSCOPY N/A 10/03/2012   Procedure: ESOPHAGOGASTRODUODENOSCOPY (EGD);  Surgeon: [name], MD;  Location: [facility] ENDOSCOPY;  Service: Endoscopy;  Laterality: N/A;  . ESOPHAGOGASTRODUODENOSCOPY N/A 12/22/2012   Procedure: ESOPHAGOGASTRODUODENOSCOPY (EGD);  Surgeon: [name], MD;  Location: [facility] ENDOSCOPY;  Service: Endoscopy;  Laterality: N/A;  . ESOPHAGOSCOPY N/A 08/15/2012   Procedure: ESOPHAGOSCOPY;  Surgeon: [name], MD;  Location: [facility];  Service: ENT;  Laterality: N/A;  . FOREIGN BODY REMOVAL ESOPHAGEAL N/A 08/15/2012   Procedure: REMOVAL FOREIGN BODY ESOPHAGEAL;  Surgeon: [name], MD;  Location: [facility];  Service: ENT;  Laterality: N/A;  . INSERT / REPLACE / REMOVE PACEMAKER    . MASTECTOMY Right 1970   Cancer  . SAVORY DILATION N/A 10/03/2012   Procedure: SAVORY DILATION;  Surgeon: [name], MD;  Location: [facility] ENDOSCOPY;  Service: Endoscopy;  Laterality: N/A;    Allergies  Allergen Reactions  . Augmentin [Amoxicillin-Pot Clavulanate] Nausea And Vomiting  . Lactose Intolerance (Gi)     Outpatient Encounter Prescriptions as of 08/28/2016  Medication Sig  . acetaminophen (TYLENOL) 650 MG CR tablet Take 650 mg by mouth 2 (two) times daily. For pain  . carboxymethylcellulose (REFRESH PLUS) 0.5 % SOLN 2 drops 2 (two) times daily.   . Dentifrices (BIOTENE DRY MOUTH CARE DT) Place 1 spray onto teeth 4 (four)  times daily.  Marland Kitchen dextromethorphan-guaiFENesin (MUCINEX DM) 30-600 MG 12hr tablet Take 1 tablet by mouth 2 (two) times daily as needed.   . Docosanol (ABREVA) 10 % CREA Apply 1 application topically 5 (five) times daily. To affected area until healed and then once daily as needed  . escitalopram (LEXAPRO) 20 MG tablet Take 20 mg by mouth daily. For depression/ anxiety.  . furosemide (LASIX) 20 MG tablet Take 20 mg by mouth daily.  Marland Kitchen GLUCERNA (GLUCERNA) LIQD Take 1 Can by mouth 2 (two) times daily between meals. At breakfast and lunch 1 can divided between b/f and lunch  . ipratropium-albuterol (DUONEB) 0.5-2.5 (3) MG/3ML SOLN Take 3 mLs by nebulization every 6 (six) hours as needed (cough/wheezing).  Marland Kitchen levothyroxine (SYNTHROID, LEVOTHROID) 75 MCG tablet Take 75 mcg by mouth daily before breakfast. For thyroid therapy  . liver oil-zinc oxide (DESITIN) 40 % ointment Apply 1 application topically as needed for irritation.  Marland Kitchen loratadine (CLARITIN) 10 MG tablet Take 10 mg by mouth daily.   Marland Kitchen LORazepam (ATIVAN) 0.5 MG tablet Take 0.5 mg by mouth daily as needed for anxiety.   Marland Kitchen losartan (COZAAR) 25 MG  tablet Take 1 tablet (25 mg total) by mouth daily.  . Melatonin 5 MG TABS Take 5 tablets by mouth at bedtime.  . memantine (NAMENDA) 10 MG tablet Take 10 mg by mouth 2 (two) times daily.  . metoprolol tartrate (LOPRESSOR) 25 MG tablet Take 25 mg by mouth 2 (two) times daily.  . Multiple Vitamin (MULTIVITAMIN) tablet Take 1 tablet by mouth daily.  . Multiple Vitamins-Minerals (PRESERVISION AREDS PO) Take by mouth daily.  Marland Kitchen omeprazole (PRILOSEC) 20 MG capsule Take 20 mg by mouth daily.  . potassium chloride (K-DUR,KLOR-CON) 10 MEQ tablet Take 10 mEq by mouth daily.  Marland Kitchen trolamine salicylate (ASPERCREME) 10 % cream Apply 1 application topically 2 (two) times daily as needed for muscle pain.  Addison Lank Hazel 14 % LIQD Apply topically as needed (for hemorrhoid flare up).   No facility-administered encounter  medications on file as of 08/28/2016.     Review of Systems  Constitutional: Negative for activity change, appetite change, chills, fatigue and fever.  HENT: Negative for congestion.   Eyes: Negative for visual disturbance.  Respiratory: Negative for chest tightness and shortness of breath.   Cardiovascular: Positive for leg swelling. Negative for chest pain and palpitations.  Gastrointestinal: Negative for abdominal distention, abdominal pain, constipation, diarrhea, nausea and vomiting.  Genitourinary: Negative for dysuria.  Musculoskeletal: Positive for arthralgias and gait problem. Negative for joint swelling.  Skin: Negative for color change.  Neurological: Negative for dizziness and weakness.  Hematological: Bruises/bleeds easily.  Psychiatric/Behavioral: Positive for confusion.    Immunization History  Administered Date(s) Administered  . Influenza Inj Mdck Quad Pf 12/22/2015  . Influenza Whole 12/17/2012  . Influenza-Unspecified 12/08/2013, 12/16/2014  . PPD Test 07/18/2012  . Pneumococcal Conjugate-13 08/27/2015   Pertinent  Health Maintenance Due  Topic Date Due  . PNA vac Low Risk Adult (2 of 2 - PPSV23) 08/26/2016  . INFLUENZA VACCINE  10/03/2016  . OPHTHALMOLOGY EXAM  10/25/2016  . HEMOGLOBIN A1C  11/23/2016  . FOOT EXAM  01/02/2017   Fall Risk  06/27/2016 11/07/2015 01/24/2015  Falls in the past year? No No No  Risk for fall due to : - History of fall(s) -   Functional Status Survey:    Vitals:   08/28/16 1407  BP: 132/82  Pulse: 91  Resp: 18  Temp: 97.6 F (36.4 C)  TempSrc: Oral  SpO2: 95%  Weight: 180 lb (81.6 kg)   Body mass index is 34.01 kg/m. Physical Exam  Constitutional: She appears well-developed and well-nourished. No distress.  Cardiovascular: Normal rate, regular rhythm, normal heart sounds and intact distal pulses.   2+ edema of arms, legs, increased abdominal prominence  Pulmonary/Chest: Effort normal and breath sounds normal. She has  no rales.  Abdominal: Soft. Bowel sounds are normal. She exhibits distension. There is no tenderness.  Musculoskeletal: Normal range of motion.  Neurological: She is alert.  Oriented to self, place  Skin: Skin is warm and dry.  Psychiatric: She has a normal mood and affect.    Labs reviewed:  Recent Labs  09/19/15 03/26/16  NA 138 142  K 4.1 4.6  BUN 11 19  CREATININE 0.6 0.6    Recent Labs  09/19/15  AST 26  ALT 15  ALKPHOS 64    Recent Labs  09/19/15 10/20/15 03/26/16  WBC 11.7 8.8 8.5  HGB 10.6* 11.5* 12.2  HCT 34* 37 36  PLT 282 171 128*   Lab Results  Component Value Date   TSH 0.88 03/26/2016  Lab Results  Component Value Date   HGBA1C 7.7 05/23/2016   Lab Results  Component Value Date   CHOL 135 09/19/2015   HDL 50 09/19/2015   LDLCALC 55 09/19/2015   TRIG 149 09/19/2015   CHOLHDL 4.1 07/01/2012   Assessment/Plan 1. Weight gain -will check current labs for renal function, may also need echo if volume overload persists  2. Lymphedema of arm -chronic, cont elevation, had OT eval in past about this  3. Controlled type 2 diabetes mellitus with complication, without long-term current use of insulin (HCC) -cont diet--NP had discussed adjusting diet, glucerna rather than other supplements, no longer on statin therapy at her age and with more comfort focused goals  4. Vascular dementia without behavioral disturbance -progressing gradually, cont to monitor, not on meds  5. Chronic venous insufficiency -cont compression hose, elevation of feet at rest  6. Oral herpes simplex infection -cont abreva which has helped swelling and discomfort. . Family/ staff Communication: discussed with snf nurse  Labs/tests ordered:  Bmp for renal function, may need echo if renal function is not cause of edema  Nilsa Macht L. Demesha Boorman, D.O. [facility] [address] [place], [place] [address] Cell Phone (Mon-Fri 8am-5pm):   [phone] On Call:  [phone] & follow prompts after 5pm & weekends Office Phone:  [phone] Office Fax:  [phone]

## 2016-08-28 NOTE — Telephone Encounter (Signed)
Patients daughter states that patient has a pacemaker check in august. Patient lives in nursing home so it is difficult to get patient out for appointments. Patients daughter wanted to know if she could do the appointment over the phone or if this is one she absolutely needed to come in for.   Spoke to Debroah Loop, RN she states that she will speak with Dr. Caryl Comes this afternoon and follow up with patients daughter in regard to message.

## 2016-08-28 NOTE — Telephone Encounter (Signed)
Reviewed situation with Dr. Caryl Comes. PPM implanted for CHB, 5.5 years batter longevity remaining, device capable of remote monitoring. OK to cancel OV and continue with remote monitoring every 3 months. Verlon Au made aware and is appreciative.

## 2016-08-29 DIAGNOSIS — R0602 Shortness of breath: Secondary | ICD-10-CM | POA: Diagnosis not present

## 2016-08-29 DIAGNOSIS — E039 Hypothyroidism, unspecified: Secondary | ICD-10-CM | POA: Diagnosis not present

## 2016-08-29 DIAGNOSIS — D649 Anemia, unspecified: Secondary | ICD-10-CM | POA: Diagnosis not present

## 2016-08-29 LAB — BASIC METABOLIC PANEL
BUN: 15 (ref 4–21)
CREATININE: 0.6 (ref 0.5–1.1)
GLUCOSE: 144
Potassium: 4.2 (ref 3.4–5.3)
Sodium: 140 (ref 137–147)

## 2016-08-29 LAB — TSH: TSH: 1.48 (ref <=5.90)

## 2016-09-03 ENCOUNTER — Non-Acute Institutional Stay (SKILLED_NURSING_FACILITY): Payer: Medicare Other | Admitting: Adult Health

## 2016-09-03 DIAGNOSIS — B002 Herpesviral gingivostomatitis and pharyngotonsillitis: Secondary | ICD-10-CM | POA: Insufficient documentation

## 2016-09-03 DIAGNOSIS — R635 Abnormal weight gain: Secondary | ICD-10-CM | POA: Diagnosis not present

## 2016-09-03 DIAGNOSIS — F339 Major depressive disorder, recurrent, unspecified: Secondary | ICD-10-CM | POA: Insufficient documentation

## 2016-09-03 NOTE — Progress Notes (Signed)
Location:  Occupational psychologist of Service:  SNF (31) Provider:   Cindi Carbon, ANP [place] [phone]   [name], DO  Patient Care Team: [name], DO as PCP - General (Geriatric Medicine) Community, [facility] [name], Altamese Dilling, MD as Attending Physician (Gastroenterology) Garvin Fila, MD as Consulting Physician (Neurology)  Extended Emergency Contact Information Primary Emergency Contact: [place] Address: [address]          [place], [place] [address] [name] of [place] Phone: [phone] Mobile Phone: [phone] Relation: Daughter  Code Status:  DNR Goals of care: Advanced Directive information Advanced Directives 08/28/2016  Does Patient Have a Medical Advance Directive? Yes  Type of Advance Directive Out of facility DNR (pink MOST or yellow form);[place];Living will  Does patient want to make changes to medical advance directive? -  Copy of [place] in Chart? Yes  Pre-existing out of facility DNR order (yellow form or pink MOST form) Yellow form placed in chart (order not valid for inpatient use)     Chief Complaint  Patient presents with  . Acute Visit    f/u diuresis    HPI:  Pt is a 83 y.o. female seen today for an acute visit for weight gain. She resides in skilled care at [facility] due to previous hx of CVA with right sided weakness.  Wt Readings from Last 3 Encounters:  09/03/16 179 lb (81.2 kg)  08/28/16 180 lb (81.6 kg)  08/20/16 180 lb (81.6 kg)  She denies any sob or chest pain. Reports abd bloating but no fatigue.   She has increased intake of chocolate, goldfish, desserts, and condiments on her food. She is on diet of baby food due to a hx of esophageal dysmotility with impaction but makes concessions with other items not on the recommended diet for quality of life.  She has increased edema in her left arm that has been  present for several months. We tried a compression sleeve but she ultimately refused it due to discomfort. This is the same side as her previous mastectomy and affected side from a CVA.  Last echo in 2015 showed EF 55-60%, Grade 1 DD, moderate aortic stenosis BNP 6/27 262.4 She has received lasix 20 mg qd for 6 days and 40 mg qd for 3 days with minimal response.    Past Medical History:  Diagnosis Date  . Abnormality of gait 2007  . Acquired cyst of kidney 2002  . Acute sinus infection 11/25/2012  . Allergic rhinitis due to pollen   . Altered mental status 11/28/2012  . Anxiety state, unspecified   . Atrioventricular block, complete ([place]) 2003   s/p PPM 2003, generator chnage 2011  . Cardiac pacemaker in situ 2003  . Coronary atherosclerosis of native coronary artery 2003   non obstructive by Cath 2003  . CVA (cerebral infarction) 06/28/2012   rt hemiparesis, dysphagia  . Dendritic keratitis 2012  . Dysuria   . Edema 2003  . Esophageal dysmotility 08/19/2012  . Food impaction of esophagus 08/15/2012  . HTN (hypertension) 06/28/2012  . Hypertension   . Macular degeneration (senile) of retina, unspecified 2012  . Malignant neoplasm of breast (female), unspecified site 1970   S/P Rt radical mastectomy  . NSTEMI (non-ST elevated myocardial infarction) ([place]) 06/28/2012  . Osteoarthrosis, unspecified whether generalized or localized, unspecified site 2013  . Pacemaker   . Personal history of colonic polyps  s/p colonoscopy/polypectomy 2003  . Pure hyperglyceridemia 2004  . Senile osteoporosis 2003  . Stroke ([place]) 06/28/2012  . Tinnitus 2007  . Type II or unspecified type diabetes mellitus without mention of complication, not stated as uncontrolled 2003  . Unspecified arthropathy, pelvic region and thigh 2010  . Unspecified glaucoma(365.9) 2013  . Unspecified hypothyroidism 2009  . Unspecified vitamin D deficiency 2009  . Urinary retention 07/07/2012   Past Surgical History:    Procedure Laterality Date  . BOTOX INJECTION N/A 10/03/2012   Procedure: BOTOX INJECTION;  Surgeon: [name], MD;  Location: [facility] ENDOSCOPY;  Service: Endoscopy;  Laterality: N/A;  . ESOPHAGOGASTRODUODENOSCOPY N/A 08/15/2012   Procedure: ESOPHAGOGASTRODUODENOSCOPY (EGD);  Surgeon: [name], MD;  Location: [facility] ENDOSCOPY;  Service: Endoscopy;  Laterality: N/A;  . ESOPHAGOGASTRODUODENOSCOPY N/A 08/15/2012   Procedure: ESOPHAGOGASTRODUODENOSCOPY (EGD);  Surgeon: [name], MD;  Location: [place];  Service: Endoscopy;  Laterality: N/A;  . ESOPHAGOGASTRODUODENOSCOPY N/A 10/03/2012   Procedure: ESOPHAGOGASTRODUODENOSCOPY (EGD);  Surgeon: [name], MD;  Location: [facility];  Service: Endoscopy;  Laterality: N/A;  . ESOPHAGOGASTRODUODENOSCOPY N/A 12/22/2012   Procedure: ESOPHAGOGASTRODUODENOSCOPY (EGD);  Surgeon: [name], MD;  Location[facility] ENDOSCOPY;  Service: Endoscopy;  Laterality: N/A;  . ESOPHAGOSCOPY N/A 08/15/2012   Procedure: ESOPHAGOSCOPY;  Surgeon: [name], MD;  Location: [place];  Service: ENT;  Laterality: N/A;  . FOREIGN BODY REMOVAL ESOPHAGEAL N/A 08/15/2012   Procedure: REMOVAL FOREIGN BODY ESOPHAGEAL;  Surgeon: [name], MD;  Location: [place];  Service: ENT;  Laterality: N/A;  . INSERT / REPLACE / REMOVE PACEMAKER    . MASTECTOMY Right 1970   Cancer  . SAVORY DILATION N/A 10/03/2012   Procedure: SAVORY DILATION;  Surgeon: [name], MD;  [facility];  Service: Endoscopy;  Laterality: N/A;    Allergies  Allergen Reactions  . Augmentin [Amoxicillin-Pot Clavulanate] Nausea And Vomiting  . Lactose Intolerance (Gi)     Outpatient Encounter Prescriptions as of 09/03/2016  Medication Sig  . acetaminophen (TYLENOL) 650 MG CR tablet Take 650 mg by mouth 2 (two) times daily. For pain  . carboxymethylcellulose (REFRESH PLUS) 0.5 % SOLN 2 drops 2 (two) times daily.   . Dentifrices (BIOTENE DRY MOUTH CARE DT) Place 1 spray onto teeth 4 (four) times daily.  Marland Kitchen  dextromethorphan-guaiFENesin (MUCINEX DM) 30-600 MG 12hr tablet Take 1 tablet by mouth 2 (two) times daily as needed.   . Docosanol (ABREVA) 10 % CREA Apply 1 application topically 5 (five) times daily. To affected area until healed and then once daily as needed  . escitalopram (LEXAPRO) 20 MG tablet Take 20 mg by mouth daily. For depression/ anxiety.  Marland Kitchen GLUCERNA (GLUCERNA) LIQD Take 1 Can by mouth 2 (two) times daily between meals. At breakfast and lunch 1 can divided between b/f and lunch  . ipratropium-albuterol (DUONEB) 0.5-2.5 (3) MG/3ML SOLN Take 3 mLs by nebulization every 6 (six) hours as needed (cough/wheezing).  Marland Kitchen levothyroxine (SYNTHROID, LEVOTHROID) 75 MCG tablet Take 75 mcg by mouth daily before breakfast. For thyroid therapy  . liver oil-zinc oxide (DESITIN) 40 % ointment Apply 1 application topically as needed for irritation.  Marland Kitchen loratadine (CLARITIN) 10 MG tablet Take 10 mg by mouth daily.   Marland Kitchen LORazepam (ATIVAN) 0.5 MG tablet Take 0.5 mg by mouth daily as needed for anxiety.   Marland Kitchen losartan (COZAAR) 25 MG tablet Take 1 tablet (25 mg total) by mouth daily.  . Melatonin 5 MG TABS Take 5  tablets by mouth at bedtime.  . memantine (NAMENDA) 10 MG tablet Take 10 mg by mouth 2 (two) times daily.  . metoprolol tartrate (LOPRESSOR) 25 MG tablet Take 25 mg by mouth 2 (two) times daily.  . Multiple Vitamin (MULTIVITAMIN) tablet Take 1 tablet by mouth daily.  . Multiple Vitamins-Minerals (PRESERVISION AREDS PO) Take by mouth daily.  Marland Kitchen omeprazole (PRILOSEC) 20 MG capsule Take 20 mg by mouth daily.  Marland Kitchen trolamine salicylate (ASPERCREME) 10 % cream Apply 1 application topically 2 (two) times daily as needed for muscle pain.  Addison Lank Hazel 14 % LIQD Apply topically as needed (for hemorrhoid flare up).   No facility-administered encounter medications on file as of 09/03/2016.     Review of Systems  Constitutional: Positive for fatigue. Negative for activity change, appetite change, chills, diaphoresis,  fever and unexpected weight change.  HENT: Negative for congestion.   Respiratory: Negative for cough, shortness of breath and wheezing.   Cardiovascular: Positive for leg swelling. Negative for chest pain and palpitations.  Gastrointestinal: Negative for abdominal distention, abdominal pain, constipation and diarrhea.  Endocrine: Negative for cold intolerance, heat intolerance, polydipsia, polyphagia and polyuria.  Genitourinary: Negative for difficulty urinating and dysuria.  Musculoskeletal: Positive for gait problem. Negative for arthralgias, back pain, joint swelling and myalgias.  Neurological: Positive for weakness. Negative for dizziness, tremors, seizures, syncope, facial asymmetry, speech difficulty, light-headedness, numbness and headaches.  Psychiatric/Behavioral: Negative for agitation, behavioral problems and confusion.       Memory loss    Immunization History  Administered Date(s) Administered  . Influenza Inj Mdck Quad Pf 12/22/2015  . Influenza Whole 12/17/2012  . Influenza-Unspecified 12/08/2013, 12/16/2014  . PPD Test 07/18/2012  . Pneumococcal Conjugate-13 08/27/2015   Pertinent  Health Maintenance Due  Topic Date Due  . PNA vac Low Risk Adult (2 of 2 - PPSV23) 08/26/2016  . INFLUENZA VACCINE  10/03/2016  . OPHTHALMOLOGY EXAM  10/25/2016  . HEMOGLOBIN A1C  11/23/2016  . FOOT EXAM  01/02/2017   Fall Risk  06/27/2016 11/07/2015 01/24/2015  Falls in the past year? No No No  Risk for fall due to : - History of fall(s) -   Functional Status Survey:    Vitals:   09/03/16 1107  BP: 114/73  Pulse: 80  Resp: 18  Temp: 97.5 F (36.4 C)  SpO2: 96%  Weight: 179 lb (81.2 kg)   Body mass index is 33.82 kg/m. Physical Exam  Constitutional: She is oriented to person, place, and time. No distress.  HENT:  Head: Normocephalic and atraumatic.  Neck: No JVD present.  Cardiovascular: Normal rate and regular rhythm.   No murmur heard. BLE edema +1. RUE edema +1, pos  radial pulse. No tender, no redness or warmth  Pulmonary/Chest: Effort normal. No respiratory distress. She has no wheezes. She has no rales.  Abdominal: Soft. Bowel sounds are normal. She exhibits no distension.  Lymphadenopathy:    She has no cervical adenopathy.  Neurological: She is alert and oriented to person, place, and time.  R sided hemiparesis.    Skin: Skin is warm and dry. No rash noted. She is not diaphoretic. No erythema.  Psychiatric: She has a normal mood and affect.    Labs reviewed:  Recent Labs  03/26/16 08/23/16 08/29/16  NA 142 141 140  K 4.6 4.6 4.2  BUN 19 17 15   CREATININE 0.6 0.6 0.6    Recent Labs  09/19/15  AST 26  ALT 15  ALKPHOS 64  Recent Labs  09/19/15 10/20/15 03/26/16  WBC 11.7 8.8 8.5  HGB 10.6* 11.5* 12.2  HCT 34* 37 36  PLT 282 171 128*   Lab Results  Component Value Date   TSH 1.48 08/29/2016   Lab Results  Component Value Date   HGBA1C 7.7 05/23/2016   Lab Results  Component Value Date   CHOL 135 09/19/2015   HDL 50 09/19/2015   LDLCALC 55 09/19/2015   TRIG 149 09/19/2015   CHOLHDL 4.1 07/01/2012   08/29/16: pro BNP 252  Significant Diagnostic Results in last 30 days:  No results found.  Assessment/Plan  1. Weight gain On exam she does appear slightly less edematous but the weights do not reflect this.  After discussion with Dr. Mariea Clonts we will order a 2D echo and a liver function panel to further assess her weight gain. With more information we may be further able to assess what is causing her weight gain and guide our current plan of care. She has multiple reasons that she could be gaining weight to include her diet, immobility, venous insuff, ?nutritional status, and ?CHF.  Ms Fojtik is not interested in making significant diet changes.  She is not SOB so I will hold off on further diuretic therapy until we have more information.      Family/ staff Communication: discussed with staff  Labs/tests ordered:  NA

## 2016-09-10 DIAGNOSIS — Z79899 Other long term (current) drug therapy: Secondary | ICD-10-CM | POA: Diagnosis not present

## 2016-09-10 LAB — HEPATIC FUNCTION PANEL
ALT: 31 (ref 7–35)
AST: 42 — AB (ref 13–35)
Alkaline Phosphatase: 48 (ref 25–125)
BILIRUBIN, TOTAL: 0.4

## 2016-09-11 DIAGNOSIS — R9431 Abnormal electrocardiogram [ECG] [EKG]: Secondary | ICD-10-CM | POA: Diagnosis not present

## 2016-09-24 ENCOUNTER — Encounter: Payer: Self-pay | Admitting: Adult Health

## 2016-09-24 ENCOUNTER — Non-Acute Institutional Stay (SKILLED_NURSING_FACILITY): Payer: Medicare Other | Admitting: Adult Health

## 2016-09-24 DIAGNOSIS — M79661 Pain in right lower leg: Secondary | ICD-10-CM | POA: Diagnosis not present

## 2016-09-24 DIAGNOSIS — R58 Hemorrhage, not elsewhere classified: Secondary | ICD-10-CM

## 2016-09-24 DIAGNOSIS — L03115 Cellulitis of right lower limb: Secondary | ICD-10-CM | POA: Diagnosis not present

## 2016-09-24 NOTE — Progress Notes (Addendum)
Location:  Occupational psychologist of Service:  SNF (31) Provider:   Cindi Carbon, ANP [place] [phone]   [name], DO  Patient Care Team: [name], DO as PCP - General (Geriatric Medicine) Community, [facility] [name], Altamese Dilling, MD as Attending Physician (Gastroenterology) Garvin Fila, MD as Consulting Physician (Neurology)  Extended Emergency Contact Information Primary Emergency Contact: [place] Address: [address]          [place], [place] [address] [name] of [place] Phone: [phone] Mobile Phone: [phone] Relation: Daughter  Code Status:  DNR Goals of care: Advanced Directive information Advanced Directives 08/28/2016  Does Patient Have a Medical Advance Directive? Yes  Type of Advance Directive Out of facility DNR (pink MOST or yellow form);[place];Living will  Does patient want to make changes to medical advance directive? -  Copy of [place] in Chart? Yes  Pre-existing out of facility DNR order (yellow form or pink MOST form) Yellow form placed in chart (order not valid for inpatient use)     Chief Complaint  Patient presents with  . Acute Visit    injury to leg    HPI:  Pt is a 83 y.o. female seen today for an acute visit for injury to right lower ext.  Two days ago she hit her leg on the lower side of the bed. She developed a bruise to the shin area. Today she has surrounding redness and mild warmth. No open areas, pain, drainage, or increased swelling. She has been compression hose and had her feet in the dependent position on morning.    Past Medical History:  Diagnosis Date  . Abnormality of gait 2007  . Acquired cyst of kidney 2002  . Acute sinus infection 11/25/2012  . Allergic rhinitis due to pollen   . Altered mental status 11/28/2012  . Anxiety state, unspecified   . Atrioventricular block, complete ([place]) 2003   s/p  PPM 2003, generator chnage 2011  . Cardiac pacemaker in situ 2003  . Coronary atherosclerosis of native coronary artery 2003   non obstructive by Cath 2003  . CVA (cerebral infarction) 06/28/2012   rt hemiparesis, dysphagia  . Dendritic keratitis 2012  . Dysuria   . Edema 2003  . Esophageal dysmotility 08/19/2012  . Food impaction of esophagus 08/15/2012  . HTN (hypertension) 06/28/2012  . Hypertension   . Macular degeneration (senile) of retina, unspecified 2012  . Malignant neoplasm of breast (female), unspecified site 1970   S/P Rt radical mastectomy  . NSTEMI (non-ST elevated myocardial infarction) ([place]) 06/28/2012  . Osteoarthrosis, unspecified whether generalized or localized, unspecified site 2013  . Pacemaker   . Personal history of colonic polyps     s/p colonoscopy/polypectomy 2003  . Pure hyperglyceridemia 2004  . Senile osteoporosis 2003  . Stroke ([place]) 06/28/2012  . Tinnitus 2007  . Type II or unspecified type diabetes mellitus without mention of complication, not stated as uncontrolled 2003  . Unspecified arthropathy, pelvic region and thigh 2010  . Unspecified glaucoma(365.9) 2013  . Unspecified hypothyroidism 2009  . Unspecified vitamin D deficiency 2009  . Urinary retention 07/07/2012   Past Surgical History:  Procedure Laterality Date  . BOTOX INJECTION N/A 10/03/2012   Procedure: BOTOX INJECTION;  Surgeon: [name], MD;  Location: [facility] ENDOSCOPY;  Service: Endoscopy;  Laterality: N/A;  . ESOPHAGOGASTRODUODENOSCOPY N/A 08/15/2012   Procedure: ESOPHAGOGASTRODUODENOSCOPY (EGD);  Surgeon: [name], MD;  Location: [facility] ENDOSCOPY;  Service: Endoscopy;  Laterality: N/A;  . ESOPHAGOGASTRODUODENOSCOPY N/A 08/15/2012   Procedure: ESOPHAGOGASTRODUODENOSCOPY (EGD);  Surgeon: [name], MD;  Location: [place];  Service: Endoscopy;  Laterality: N/A;  . ESOPHAGOGASTRODUODENOSCOPY N/A 10/03/2012   Procedure: ESOPHAGOGASTRODUODENOSCOPY (EGD);  Surgeon: [name], MD;  Location:  [facility];  Service: Endoscopy;  Laterality: N/A;  . ESOPHAGOGASTRODUODENOSCOPY N/A 12/22/2012   Procedure: ESOPHAGOGASTRODUODENOSCOPY (EGD);  Surgeon: [name], MD;  Location: [facility] ENDOSCOPY;  Service: Endoscopy;  Laterality: N/A;  . ESOPHAGOSCOPY N/A 08/15/2012   Procedure: ESOPHAGOSCOPY;  Surgeon: [name], MD;  Location: [place];  Service: ENT;  Laterality: N/A;  . FOREIGN BODY REMOVAL ESOPHAGEAL N/A 08/15/2012   Procedure: REMOVAL FOREIGN BODY ESOPHAGEAL;  Surgeon: [name], MD;  Location: [place];  Service: ENT;  Laterality: N/A;  . INSERT / REPLACE / REMOVE PACEMAKER    . MASTECTOMY Right 1970   Cancer  . SAVORY DILATION N/A 10/03/2012   Procedure: SAVORY DILATION;  Surgeon: [name], MD;  [facility] ENDOS[facility];  Service: Endoscopy;  Laterality: N/A;    Allergies  Allergen Reactions  . Augmentin [Amoxicillin-Pot Clavulanate] Nausea And Vomiting  . Lactose Intolerance (Gi)     Outpatient Encounter Prescriptions as of 09/24/2016  Medication Sig  . acetaminophen (TYLENOL) 650 MG CR tablet Take 650 mg by mouth 2 (two) times daily. For pain  . carboxymethylcellulose (REFRESH PLUS) 0.5 % SOLN 2 drops 2 (two) times daily.   . Dentifrices (BIOTENE DRY MOUTH CARE DT) Place 1 spray onto teeth 4 (four) times daily.  Marland Kitchen dextromethorphan-guaiFENesin (MUCINEX DM) 30-600 MG 12hr tablet Take 1 tablet by mouth 2 (two) times daily as needed.   . Docosanol (ABREVA) 10 % CREA Apply 1 application topically 5 (five) times daily. To affected area until healed and then once daily as needed  . escitalopram (LEXAPRO) 20 MG tablet Take 20 mg by mouth daily. For depression/ anxiety.  Marland Kitchen GLUCERNA (GLUCERNA) LIQD Take 1 Can by mouth 2 (two) times daily between meals. At breakfast and lunch 1 can divided between b/f and lunch  . ipratropium-albuterol (DUONEB) 0.5-2.5 (3) MG/3ML SOLN Take 3 mLs by nebulization every 6 (six) hours as needed (cough/wheezing).  Marland Kitchen levothyroxine (SYNTHROID, LEVOTHROID) 75 MCG  tablet Take 75 mcg by mouth daily before breakfast. For thyroid therapy  . liver oil-zinc oxide (DESITIN) 40 % ointment Apply 1 application topically as needed for irritation.  Marland Kitchen loratadine (CLARITIN) 10 MG tablet Take 10 mg by mouth daily.   Marland Kitchen LORazepam (ATIVAN) 0.5 MG tablet Take 0.5 mg by mouth daily as needed for anxiety.   Marland Kitchen losartan (COZAAR) 25 MG tablet Take 1 tablet (25 mg total) by mouth daily.  . Melatonin 5 MG TABS Take 5 tablets by mouth at bedtime.  . memantine (NAMENDA) 10 MG tablet Take 10 mg by mouth 2 (two) times daily.  . metoprolol tartrate (LOPRESSOR) 25 MG tablet Take 25 mg by mouth 2 (two) times daily.  . Multiple Vitamin (MULTIVITAMIN) tablet Take 1 tablet by mouth daily.  . Multiple Vitamins-Minerals (PRESERVISION AREDS PO) Take by mouth daily.  Marland Kitchen omeprazole (PRILOSEC) 20 MG capsule Take 20 mg by mouth daily.  Marland Kitchen trolamine salicylate (ASPERCREME) 10 % cream Apply 1 application topically 2 (two) times daily as needed for muscle pain.  Addison Lank Hazel 14 % LIQD Apply topically as needed (for hemorrhoid flare up).   No facility-administered encounter medications on file as of 09/24/2016.  Review of Systems  Constitutional: Negative for activity change, appetite change, chills, diaphoresis, fatigue, fever and unexpected weight change.  HENT: Negative for congestion.   Respiratory: Negative for cough, shortness of breath and wheezing.   Cardiovascular: Positive for leg swelling. Negative for chest pain and palpitations.  Gastrointestinal: Negative for abdominal distention, abdominal pain, constipation and diarrhea.  Genitourinary: Negative for difficulty urinating and dysuria.  Musculoskeletal: Positive for gait problem. Negative for arthralgias, back pain, joint swelling and myalgias.  Skin: Positive for wound.  Neurological: Positive for weakness. Negative for dizziness, tremors, seizures, syncope, facial asymmetry, speech difficulty, light-headedness, numbness and  headaches.  Psychiatric/Behavioral: Negative for agitation, behavioral problems and confusion.       Memory loss    Immunization History  Administered Date(s) Administered  . Influenza Inj Mdck Quad Pf 12/22/2015  . Influenza Whole 12/17/2012  . Influenza-Unspecified 12/08/2013, 12/16/2014  . PPD Test 07/18/2012  . Pneumococcal Conjugate-13 08/27/2015   Pertinent  Health Maintenance Due  Topic Date Due  . PNA vac Low Risk Adult (2 of 2 - PPSV23) 08/26/2016  . INFLUENZA VACCINE  10/03/2016  . OPHTHALMOLOGY EXAM  10/25/2016  . HEMOGLOBIN A1C  11/23/2016  . FOOT EXAM  01/02/2017   Fall Risk  06/27/2016 11/07/2015 01/24/2015  Falls in the past year? No No No  Risk for fall due to : - History of fall(s) -   Functional Status Survey:    Vitals:   09/24/16 1519  BP: (!) 170/93  Pulse: 90  Resp: 20  Temp: (!) 97.2 F (36.2 C)  SpO2: 98%  Weight: 179 lb (81.2 kg)   Body mass index is 33.82 kg/m. Physical Exam  Constitutional: She is oriented to person, place, and time. No distress.  HENT:  Head: Normocephalic and atraumatic.  Neck: No JVD present.  Cardiovascular: Normal rate and regular rhythm.   No murmur heard. Pulmonary/Chest: Effort normal and breath sounds normal. No respiratory distress. She has no wheezes.  Abdominal: Soft. Bowel sounds are normal.  Neurological: She is alert and oriented to person, place, and time.  Skin: Skin is warm and dry. She is not diaphoretic.  Ecchymoses to right shin with surrounding erythema from ankle to knee.  Mild warmth, no increase in swelling. No open areas or drainage. Neg homans sign. No pain on palpation. No adenopathy  Psychiatric: She has a normal mood and affect.    Labs reviewed:  Recent Labs  03/26/16 08/23/16 08/29/16  NA 142 141 140  K 4.6 4.6 4.2  BUN 19 17 15   CREATININE 0.6 0.6 0.6   No results for input(s): AST, ALT, ALKPHOS, BILITOT, PROT, ALBUMIN in the last 8760 hours.  Recent Labs  10/20/15 03/26/16    WBC 8.8 8.5  HGB 11.5* 12.2  HCT 37 36  PLT 171 128*   Lab Results  Component Value Date   TSH 1.48 08/29/2016   Lab Results  Component Value Date   HGBA1C 7.7 05/23/2016   Lab Results  Component Value Date   CHOL 135 09/19/2015   HDL 50 09/19/2015   LDLCALC 55 09/19/2015   TRIG 149 09/19/2015   CHOLHDL 4.1 07/01/2012    Significant Diagnostic Results in last 30 days:  No results found.  Assessment/Plan  1. Cellulitis of right lower extremity No signs of systemic illness Doxycycline 100 mg BID for 7 days with florastor 1 cap BID for 7 days Monitor VS and marked areas of redness  2. Ecchymosis Ice 15 min TID for 72  hrs, keep right leg elevated and hose off Xray RLE   Family/ staff Communication: discussed with resident  Labs/tests ordered:  CBC, RLE xray

## 2016-09-25 DIAGNOSIS — D649 Anemia, unspecified: Secondary | ICD-10-CM | POA: Diagnosis not present

## 2016-09-25 DIAGNOSIS — R609 Edema, unspecified: Secondary | ICD-10-CM | POA: Diagnosis not present

## 2016-09-25 LAB — CBC AND DIFFERENTIAL
HCT: 39 (ref 36–46)
Hemoglobin: 13.7 (ref 12.0–16.0)
Platelets: 130 — AB (ref 150–399)
WBC: 6.8

## 2016-10-08 DIAGNOSIS — R262 Difficulty in walking, not elsewhere classified: Secondary | ICD-10-CM | POA: Diagnosis not present

## 2016-10-08 DIAGNOSIS — R414 Neurologic neglect syndrome: Secondary | ICD-10-CM | POA: Diagnosis not present

## 2016-10-08 DIAGNOSIS — L03115 Cellulitis of right lower limb: Secondary | ICD-10-CM | POA: Diagnosis not present

## 2016-10-09 DIAGNOSIS — R262 Difficulty in walking, not elsewhere classified: Secondary | ICD-10-CM | POA: Diagnosis not present

## 2016-10-09 DIAGNOSIS — L03115 Cellulitis of right lower limb: Secondary | ICD-10-CM | POA: Diagnosis not present

## 2016-10-09 DIAGNOSIS — R414 Neurologic neglect syndrome: Secondary | ICD-10-CM | POA: Diagnosis not present

## 2016-10-10 DIAGNOSIS — R414 Neurologic neglect syndrome: Secondary | ICD-10-CM | POA: Diagnosis not present

## 2016-10-10 DIAGNOSIS — R262 Difficulty in walking, not elsewhere classified: Secondary | ICD-10-CM | POA: Diagnosis not present

## 2016-10-10 DIAGNOSIS — L03115 Cellulitis of right lower limb: Secondary | ICD-10-CM | POA: Diagnosis not present

## 2016-10-15 DIAGNOSIS — R414 Neurologic neglect syndrome: Secondary | ICD-10-CM | POA: Diagnosis not present

## 2016-10-15 DIAGNOSIS — L03115 Cellulitis of right lower limb: Secondary | ICD-10-CM | POA: Diagnosis not present

## 2016-10-15 DIAGNOSIS — R262 Difficulty in walking, not elsewhere classified: Secondary | ICD-10-CM | POA: Diagnosis not present

## 2016-10-16 ENCOUNTER — Non-Acute Institutional Stay (SKILLED_NURSING_FACILITY): Payer: Medicare Other | Admitting: Internal Medicine

## 2016-10-16 DIAGNOSIS — L03115 Cellulitis of right lower limb: Secondary | ICD-10-CM | POA: Diagnosis not present

## 2016-10-16 DIAGNOSIS — F0150 Vascular dementia without behavioral disturbance: Secondary | ICD-10-CM | POA: Diagnosis not present

## 2016-10-16 DIAGNOSIS — I872 Venous insufficiency (chronic) (peripheral): Secondary | ICD-10-CM | POA: Diagnosis not present

## 2016-10-16 DIAGNOSIS — R262 Difficulty in walking, not elsewhere classified: Secondary | ICD-10-CM | POA: Diagnosis not present

## 2016-10-16 DIAGNOSIS — M79661 Pain in right lower leg: Secondary | ICD-10-CM

## 2016-10-16 DIAGNOSIS — R414 Neurologic neglect syndrome: Secondary | ICD-10-CM | POA: Diagnosis not present

## 2016-10-16 DIAGNOSIS — M7989 Other specified soft tissue disorders: Secondary | ICD-10-CM

## 2016-10-17 NOTE — Progress Notes (Signed)
Location:  Artist of Service:  SNF (31) Provider:   [name], DO  Patient Care Team: [name], DO as PCP - General (Geriatric Medicine) Community, [facility] [name], Altamese Dilling, MD as Attending Physician (Gastroenterology) Garvin Fila, MD as Consulting Physician (Neurology)  Extended Emergency Contact Information Primary Emergency Contact: [place] Address: [address]          [place], [place] [address] [name] of [place] Phone: [phone] Mobile Phone: [phone] Relation: Daughter  Code Status:  DNR Goals of care: Advanced Directive information Advanced Directives 08/28/2016  Does Patient Have a Medical Advance Directive? Yes  Type of Advance Directive Out of facility DNR (pink MOST or yellow form);[place];Living will  Does patient want to make changes to medical advance directive? -  Copy of [place] in Chart? Yes  Pre-existing out of facility DNR order (yellow form or pink MOST form) Yellow form placed in chart (order not valid for inpatient use)     Chief Complaint  Patient presents with  . Acute Visit    pain, swelling and redness over lower leg after removing compression hose    HPI:  Pt is a 83 y.o. female seen today for an acute visit for right anterior shin pain, swelling and redness after zippered compression hose removed.  Ms. Mun has been having difficulty with increased edema for a couple of months now.  She also had injured her right leg when she ran her power chair into her bed.  There has been a bump over the anterolateral shin since that injury.  She has not had pain there since recovering from a cellulitis associated with that injury. This afternoon, her compression sock was removed b/c she had pain there.  There is a streak of redness consistent with the location of the zipper over the middle of the chronically swollen area and there is  tenderness there.  Her daughter is here visiting and confirms the story.  Pt is afebrile and otherwise feels well.  She remembers what we explain about this for only a short time.    Past Medical History:  Diagnosis Date  . Abnormality of gait 2007  . Acquired cyst of kidney 2002  . Acute sinus infection 11/25/2012  . Allergic rhinitis due to pollen   . Altered mental status 11/28/2012  . Anxiety state, unspecified   . Atrioventricular block, complete ([place]) 2003   s/p PPM 2003, generator chnage 2011  . Cardiac pacemaker in situ 2003  . Coronary atherosclerosis of native coronary artery 2003   non obstructive by Cath 2003  . CVA (cerebral infarction) 06/28/2012   rt hemiparesis, dysphagia  . Dendritic keratitis 2012  . Dysuria   . Edema 2003  . Esophageal dysmotility 08/19/2012  . Food impaction of esophagus 08/15/2012  . HTN (hypertension) 06/28/2012  . Hypertension   . Macular degeneration (senile) of retina, unspecified 2012  . Malignant neoplasm of breast (female), unspecified site 1970   S/P Rt radical mastectomy  . NSTEMI (non-ST elevated myocardial infarction) ([place]) 06/28/2012  . Osteoarthrosis, unspecified whether generalized or localized, unspecified site 2013  . Pacemaker   . Personal history of colonic polyps     s/p colonoscopy/polypectomy 2003  . Pure hyperglyceridemia 2004  . Senile osteoporosis 2003  . Stroke ([place]) 06/28/2012  . Tinnitus 2007  . Type II or unspecified type diabetes mellitus without mention of complication, not stated as uncontrolled 2003  .  Unspecified arthropathy, pelvic region and thigh 2010  . Unspecified glaucoma(365.9) 2013  . Unspecified hypothyroidism 2009  . Unspecified vitamin D deficiency 2009  . Urinary retention 07/07/2012   Past Surgical History:  Procedure Laterality Date  . BOTOX INJECTION N/A 10/03/2012   Procedure: BOTOX INJECTION;  Surgeon: [name], MD;  Location: [facility] ENDOSCOPY;  Service: Endoscopy;  Laterality: N/A;  .  ESOPHAGOGASTRODUODENOSCOPY N/A 08/15/2012   Procedure: ESOPHAGOGASTRODUODENOSCOPY (EGD);  Surgeon: [name], MD;  Location: [facility] ENDOSCOPY;  Service: Endoscopy;  Laterality: N/A;  . ESOPHAGOGASTRODUODENOSCOPY N/A 08/15/2012   Procedure: ESOPHAGOGASTRODUODENOSCOPY (EGD);  Surgeon: [name], MD;  Location: [place];  Service: Endoscopy;  Laterality: N/A;  . ESOPHAGOGASTRODUODENOSCOPY N/A 10/03/2012   Procedure: ESOPHAGOGASTRODUODENOSCOPY (EGD);  Surgeon: [name], MD;  Location: [facility];  Service: Endoscopy;  Laterality: N/A;  . ESOPHAGOGASTRODUODENOSCOPY N/A 12/22/2012   Procedure: ESOPHAGOGASTRODUODENOSCOPY (EGD);  Surgeon: [name], MD;  Location: [facility] ENDOSCOPY;  Service: Endoscopy;  Laterality: N/A;  . ESOPHAGOSCOPY N/A 08/15/2012   Procedure: ESOPHAGOSCOPY;  Surgeon: [name], MD;  Location: [place];  Service: ENT;  Laterality: N/A;  . FOREIGN BODY REMOVAL ESOPHAGEAL N/A 08/15/2012   Procedure: REMOVAL FOREIGN BODY ESOPHAGEAL;  Surgeon: [name], MD;  Location: [place];  Service: ENT;  Laterality: N/A;  . INSERT / REPLACE / REMOVE PACEMAKER    . MASTECTOMY Right 1970   Cancer  . SAVORY DILATION N/A 10/03/2012   Procedure: SAVORY DILATION;  Surgeon: [name], MD;  [facility];  Service: Endoscopy;  Laterality: N/A;    Allergies  Allergen Reactions  . Augmentin [Amoxicillin-Pot Clavulanate] Nausea And Vomiting  . Lactose Intolerance (Gi)     Outpatient Encounter Prescriptions as of 10/16/2016  Medication Sig  . acetaminophen (TYLENOL) 650 MG CR tablet Take 650 mg by mouth 2 (two) times daily. For pain  . carboxymethylcellulose (REFRESH PLUS) 0.5 % SOLN 2 drops 2 (two) times daily.   . Dentifrices (BIOTENE DRY MOUTH CARE DT) Place 1 spray onto teeth 4 (four) times daily.  Marland Kitchen dextromethorphan-guaiFENesin (MUCINEX DM) 30-600 MG 12hr tablet Take 1 tablet by mouth 2 (two) times daily as needed.   . Docosanol (ABREVA) 10 % CREA Apply 1 application topically 5 (five)  times daily. To affected area until healed and then once daily as needed  . escitalopram (LEXAPRO) 20 MG tablet Take 20 mg by mouth daily. For depression/ anxiety.  Marland Kitchen GLUCERNA (GLUCERNA) LIQD Take 1 Can by mouth 2 (two) times daily between meals. At breakfast and lunch 1 can divided between b/f and lunch  . ipratropium-albuterol (DUONEB) 0.5-2.5 (3) MG/3ML SOLN Take 3 mLs by nebulization every 6 (six) hours as needed (cough/wheezing).  Marland Kitchen levothyroxine (SYNTHROID, LEVOTHROID) 75 MCG tablet Take 75 mcg by mouth daily before breakfast. For thyroid therapy  . liver oil-zinc oxide (DESITIN) 40 % ointment Apply 1 application topically as needed for irritation.  Marland Kitchen loratadine (CLARITIN) 10 MG tablet Take 10 mg by mouth daily.   Marland Kitchen LORazepam (ATIVAN) 0.5 MG tablet Take 0.5 mg by mouth daily as needed for anxiety.   Marland Kitchen losartan (COZAAR) 25 MG tablet Take 1 tablet (25 mg total) by mouth daily.  . Melatonin 5 MG TABS Take 5 tablets by mouth at bedtime.  . memantine (NAMENDA) 10 MG tablet Take 10 mg by mouth 2 (two) times daily.  . metoprolol tartrate (LOPRESSOR) 25 MG tablet Take 25 mg by mouth 2 (two) times daily.  . Multiple Vitamin (MULTIVITAMIN)  tablet Take 1 tablet by mouth daily.  . Multiple Vitamins-Minerals (PRESERVISION AREDS PO) Take by mouth daily.  Marland Kitchen omeprazole (PRILOSEC) 20 MG capsule Take 20 mg by mouth daily.  Marland Kitchen trolamine salicylate (ASPERCREME) 10 % cream Apply 1 application topically 2 (two) times daily as needed for muscle pain.  [place] L[place] Hazel 14 % LIQD Apply topically as needed (for hemorrhoid flare up).   No facility-administered encounter medications on file as of 10/16/2016.     Review of Systems  Constitutional: Negative for activity change, appetite change, chills, fatigue and fever.  Respiratory: Negative for shortness of breath.   Cardiovascular: Positive for leg swelling. Negative for chest pain.  Musculoskeletal:       Pain over right anterolateral shin  Skin:       Redness  over sore area on leg  Hematological: Bruises/bleeds easily.  Psychiatric/Behavioral: Positive for confusion.    Immunization History  Administered Date(s) Administered  . Influenza Inj Mdck Quad Pf 12/22/2015  . Influenza Whole 12/17/2012  . Influenza-Unspecified 12/08/2013, 12/16/2014  . PPD Test 07/18/2012  . Pneumococcal Conjugate-13 08/27/2015   Pertinent  Health Maintenance Due  Topic Date Due  . PNA vac Low Risk Adult (2 of 2 - PPSV23) 08/26/2016  . INFLUENZA VACCINE  10/03/2016  . OPHTHALMOLOGY EXAM  10/25/2016  . HEMOGLOBIN A1C  11/23/2016  . FOOT EXAM  01/02/2017   Fall Risk  06/27/2016 11/07/2015 01/24/2015  Falls in the past year? No No No  Risk for fall due to : - History of fall(s) -   Functional Status Survey:  dependent except feeding  Vitals:   10/16/16 1117  BP: 133/77  Pulse: 82  Resp: 18  Temp: (!) 97.5 F (36.4 C)  SpO2: 95%  Weight: 179 lb (81.2 kg)  Height: 5\' 1"  (1.549 m)   Body mass index is 33.82 kg/m. Physical Exam  Constitutional: She appears well-developed and well-nourished. No distress.  Cardiovascular: Normal rate and regular rhythm.   Pulmonary/Chest: Effort normal and breath sounds normal. She has no rales.  Neurological: She is alert.  Short term memory loss  Skin: Skin is warm and dry.  Erythema in line where zipper was over the chronically swollen area on her right anterolateral shin, tender to touch, not warmer than the rest of the leg  Psychiatric: She has a normal mood and affect.    Labs reviewed:  Recent Labs  03/26/16 08/23/16 08/29/16  NA 142 141 140  K 4.6 4.6 4.2  BUN 19 17 15   CREATININE 0.6 0.6 0.6    Recent Labs  09/10/16  AST 42*  ALT 31  ALKPHOS 48    Recent Labs  03/26/16  WBC 8.5  HGB 12.2  HCT 36  PLT 128*   Lab Results  Component Value Date   TSH 1.48 08/29/2016   Lab Results  Component Value Date   HGBA1C 7.7 05/23/2016   Lab Results  Component Value Date   CHOL 135 09/19/2015    HDL 50 09/19/2015   LDLCALC 55 09/19/2015   TRIG 149 09/19/2015   CHOLHDL 4.1 07/01/2012    Assessment/Plan 1. Pain and swelling of right lower leg -due to zipper from compression sock being over the swollen area from prior injury -nurse manager present with me and we decided to leave sock off just for this evening, then return to using, but place with zippers medially to avoid recurrence--she was going to be sure nurse on unit was aware and to make CNAs  aware of change--I suggested a drawing be made -in the long run, prefer hose w/o zippers if possible (I'm told there were difficulties getting these on but I know staff have since had an inservice on this technique)  2. Chronic venous insufficiency -needs compression to help with edema so resume in am -see above  3. Vascular dementia without behavioral disturbance -pt will not remember this change in the plan so communication among staff crucial, her daughter is aware but, of course, cannot always be there  Family/ staff Communication: discussed with nurse manager, pt's daughter, pt  Labs/tests ordered:  Change placement of compression hose and eventually switch to those w/o zippers  Terrill Wauters L. Roshaunda Starkey, D.O. [facility] [address], [place] [address] Cell Phone (Mon-Fri 8am-5pm):  [phone] On Call:  [phone] & follow prompts after 5pm & weekends Office Phone:  [phone] Office Fax:  [phone]

## 2016-10-19 DIAGNOSIS — R414 Neurologic neglect syndrome: Secondary | ICD-10-CM | POA: Diagnosis not present

## 2016-10-19 DIAGNOSIS — R262 Difficulty in walking, not elsewhere classified: Secondary | ICD-10-CM | POA: Diagnosis not present

## 2016-10-19 DIAGNOSIS — L03115 Cellulitis of right lower limb: Secondary | ICD-10-CM | POA: Diagnosis not present

## 2016-10-21 ENCOUNTER — Encounter: Payer: Self-pay | Admitting: Internal Medicine

## 2016-10-22 DIAGNOSIS — R262 Difficulty in walking, not elsewhere classified: Secondary | ICD-10-CM | POA: Diagnosis not present

## 2016-10-22 DIAGNOSIS — R414 Neurologic neglect syndrome: Secondary | ICD-10-CM | POA: Diagnosis not present

## 2016-10-22 DIAGNOSIS — L03115 Cellulitis of right lower limb: Secondary | ICD-10-CM | POA: Diagnosis not present

## 2016-10-22 NOTE — Addendum Note (Signed)
Addended by: Hollace Kinnier L on: 10/22/2016 10:09 AM   Modules accepted: Level of Service

## 2016-10-24 DIAGNOSIS — L03115 Cellulitis of right lower limb: Secondary | ICD-10-CM | POA: Diagnosis not present

## 2016-10-24 DIAGNOSIS — R262 Difficulty in walking, not elsewhere classified: Secondary | ICD-10-CM | POA: Diagnosis not present

## 2016-10-24 DIAGNOSIS — R414 Neurologic neglect syndrome: Secondary | ICD-10-CM | POA: Diagnosis not present

## 2016-10-26 DIAGNOSIS — R414 Neurologic neglect syndrome: Secondary | ICD-10-CM | POA: Diagnosis not present

## 2016-10-26 DIAGNOSIS — R262 Difficulty in walking, not elsewhere classified: Secondary | ICD-10-CM | POA: Diagnosis not present

## 2016-10-26 DIAGNOSIS — L03115 Cellulitis of right lower limb: Secondary | ICD-10-CM | POA: Diagnosis not present

## 2016-11-07 ENCOUNTER — Ambulatory Visit (INDEPENDENT_AMBULATORY_CARE_PROVIDER_SITE_OTHER): Payer: Medicare Other | Admitting: *Deleted

## 2016-11-07 ENCOUNTER — Telehealth: Payer: Self-pay | Admitting: Cardiology

## 2016-11-07 DIAGNOSIS — I442 Atrioventricular block, complete: Secondary | ICD-10-CM | POA: Diagnosis not present

## 2016-11-07 NOTE — Telephone Encounter (Signed)
LMOVM reminding pt to send remote transmission.   

## 2016-11-08 NOTE — Progress Notes (Signed)
Remote pacemaker transmission.   

## 2016-11-13 ENCOUNTER — Encounter: Payer: Self-pay | Admitting: Cardiology

## 2016-11-16 LAB — CUP PACEART REMOTE DEVICE CHECK
Battery Impedance: 810 Ohm
Brady Statistic AP VP Percent: 0 %
Brady Statistic AS VP Percent: 100 %
Brady Statistic AS VS Percent: 0 %
Implantable Lead Implant Date: 20110506
Implantable Lead Location: 753860
Implantable Lead Model: 5076
Implantable Lead Model: 5076
Implantable Pulse Generator Implant Date: 20110506
Lead Channel Impedance Value: 473 Ohm
Lead Channel Impedance Value: 474 Ohm
Lead Channel Pacing Threshold Amplitude: 0.75 V
Lead Channel Sensing Intrinsic Amplitude: 1 mV
Lead Channel Setting Pacing Amplitude: 2 V
MDC IDC LEAD IMPLANT DT: 20110506
MDC IDC LEAD LOCATION: 753859
MDC IDC MSMT BATTERY REMAINING LONGEVITY: 63 mo
MDC IDC MSMT BATTERY VOLTAGE: 2.78 V
MDC IDC MSMT LEADCHNL RA PACING THRESHOLD PULSEWIDTH: 0.4 ms
MDC IDC MSMT LEADCHNL RV PACING THRESHOLD AMPLITUDE: 1.125 V
MDC IDC MSMT LEADCHNL RV PACING THRESHOLD PULSEWIDTH: 0.4 ms
MDC IDC SESS DTM: 20180906150721
MDC IDC SET LEADCHNL RV PACING AMPLITUDE: 2.5 V
MDC IDC SET LEADCHNL RV PACING PULSEWIDTH: 0.4 ms
MDC IDC SET LEADCHNL RV SENSING SENSITIVITY: 2.8 mV
MDC IDC STAT BRADY AP VS PERCENT: 0 %

## 2016-11-20 ENCOUNTER — Non-Acute Institutional Stay (SKILLED_NURSING_FACILITY): Payer: Medicare Other | Admitting: Internal Medicine

## 2016-11-20 ENCOUNTER — Encounter: Payer: Self-pay | Admitting: Internal Medicine

## 2016-11-20 DIAGNOSIS — I10 Essential (primary) hypertension: Secondary | ICD-10-CM

## 2016-11-20 DIAGNOSIS — I69351 Hemiplegia and hemiparesis following cerebral infarction affecting right dominant side: Secondary | ICD-10-CM

## 2016-11-20 DIAGNOSIS — F0150 Vascular dementia without behavioral disturbance: Secondary | ICD-10-CM | POA: Diagnosis not present

## 2016-11-20 DIAGNOSIS — E039 Hypothyroidism, unspecified: Secondary | ICD-10-CM | POA: Diagnosis not present

## 2016-11-20 DIAGNOSIS — E118 Type 2 diabetes mellitus with unspecified complications: Secondary | ICD-10-CM | POA: Diagnosis not present

## 2016-11-20 DIAGNOSIS — F339 Major depressive disorder, recurrent, unspecified: Secondary | ICD-10-CM | POA: Diagnosis not present

## 2016-11-20 DIAGNOSIS — F5101 Primary insomnia: Secondary | ICD-10-CM

## 2016-11-20 DIAGNOSIS — I442 Atrioventricular block, complete: Secondary | ICD-10-CM

## 2016-11-20 NOTE — Progress Notes (Signed)
Patient ID: Rachel Cooke, female   DOB: 03-11-1919, 83 y.o.   MRN: [id number]  Location:  [place] Room Number: [place] of Service:  SNF ([phone]) Provider:   [name], DO  Patient Care Team: [name], DO as PCP - General (Geriatric Medicine) [facility], [facility] [name], Altamese Dilling, MD as Attending Physician (Gastroenterology) Garvin Fila, MD as Consulting Physician (Neurology)  Extended Emergency Contact Information Primary Emergency Contact: [place] Address: [address]          [place], [place] [address] [name] of [place] Phone: [phone] Mobile Phone: [phone] Relation: Daughter  Code Status:  DNR Goals of care: Advanced Directive information Advanced Directives 11/20/2016  Does Patient Have a Medical Advance Directive? Yes  Type of Advance Directive Out of facility DNR (pink MOST or yellow form);[place];Living will  Does patient want to make changes to medical advance directive? -  Copy of [place] in Chart? Yes  Pre-existing out of facility DNR order (yellow form or pink MOST form) Yellow form placed in chart (order not valid for inpatient use)     Chief Complaint  Patient presents with  . Medical Management of Chronic Issues    routine visit    HPI:  Pt is a 83 y.o. female seen today for medical management of chronic diseases.  Rachel Cooke says she has no complaints and is doing quite well.   Depression-Says she is tired of being in a chair all of the time but enjoys going to activities. Says she feels pretty good lately despite her situation. Dr Casimiro Needle is going to see her to manage her medications which includes lorazepam and  Lexapro.   Dementia- Currently on Namenda Last MMSE was 23/30 on June 27, 2016. Had a conversation about her granddaughters and she recalled going to paint at activities that AM. Uses a powered wheelchair for  mobility. This enables her to attend activities.  Diabetes Type 2-Not on any medications at this time. Last A1c was 7.7 in March. Will recheck A!C   Insomnia-Says she is sleeping OK as long as her room temperature is OK. On Melatonin HS.   Hypothyroid-TSH 1.48 in June. Maintained on levothyroxine.   Hypertension and AV block- on Metoprolol and losartan with good control. States that her heart has not been giving her any trouble.   Chronic venous insufficiency Wears zippered compression socks. Had previously had issues with zipper causing redness and pain but this has been remedied by putting the zipper to the inside of her leg and not on shin   Past Medical History:  Diagnosis Date  . Abnormality of gait 2007  . Acquired cyst of kidney 2002  . Acute sinus infection 11/25/2012  . Allergic rhinitis due to pollen   . Altered mental status 11/28/2012  . Anxiety state, unspecified   . Atrioventricular block, complete ([place]) 2003   s/p PPM 2003, generator chnage 2011  . Cardiac pacemaker in situ 2003  . Coronary atherosclerosis of native coronary artery 2003   non obstructive by Cath 2003  . CVA (cerebral infarction) 06/28/2012   rt hemiparesis, dysphagia  . Dendritic keratitis 2012  . Dysuria   . Edema 2003  . Esophageal dysmotility 08/19/2012  . Food impaction of esophagus 08/15/2012  . HTN (hypertension) 06/28/2012  . Hypertension   . Macular degeneration (senile) of retina, unspecified 2012  . Malignant neoplasm of breast (female), unspecified site 42   S/P  Rt radical mastectomy  . NSTEMI (non-ST elevated myocardial infarction) ([place]) 06/28/2012  . Osteoarthrosis, unspecified whether generalized or localized, unspecified site 2013  . Pacemaker   . Personal history of colonic polyps     s/p colonoscopy/polypectomy 2003  . Pure hyperglyceridemia 2004  . Senile osteoporosis 2003  . Stroke ([place]) 06/28/2012  . Tinnitus 2007  . Type II or unspecified type diabetes mellitus without  mention of complication, not stated as uncontrolled 2003  . Unspecified arthropathy, pelvic region and thigh 2010  . Unspecified glaucoma(365.9) 2013  . Unspecified hypothyroidism 2009  . Unspecified vitamin D deficiency 2009  . Urinary retention 07/07/2012   Past Surgical History:  Procedure Laterality Date  . BOTOX INJECTION N/A 10/03/2012   Procedure: BOTOX INJECTION;  Surgeon: [name], MD;  [facility];  Service: Endoscopy;  Laterality: N/A;  . ESOPHAGOGASTRODUODENOSCOPY N/A 08/15/2012   Procedure: ESOPHAGOGASTRODUODENOSCOPY (EGD);  Surgeon: [name], MD;  Location: [facility] ENDOSCOPY;  Service: Endoscopy;  Laterality: N/A;  . ESOPHAGOGASTRODUODENOSCOPY N/A 08/15/2012   Procedure: ESOPHAGOGASTRODUODENOSCOPY (EGD);  Surgeon: [name], MD;  Location: [place];  Service: Endoscopy;  Laterality: N/A;  . ESOPHAGOGASTRODUODENOSCOPY N/A 10/03/2012   Procedure: ESOPHAGOGASTRODUODENOSCOPY (EGD);  Surgeon: [name], MD;  Location: [facility];  Service: Endoscopy;  Laterality: N/A;  . ESOPHAGOGASTRODUODENOSCOPY N/A 12/22/2012   Procedure: ESOPHAGOGASTRODUODENOSCOPY (EGD);  Surgeon: [name], MD;  Location: [facility] ENDOSCOPY;  Service: Endoscopy;  Laterality: N/A;  . ESOPHAGOSCOPY N/A 08/15/2012   Procedure: ESOPHAGOSCOPY;  Surgeon: [name], MD;  Location: [place];  Service: ENT;  Laterality: N/A;  . FOREIGN BODY REMOVAL ESOPHAGEAL N/A 08/15/2012   Procedure: REMOVAL FOREIGN BODY ESOPHAGEAL;  Surgeon: [name], MD;  Location: [facility];  Service: ENT;  Laterality: N/A;  . INSERT / REPLACE / REMOVE PACEMAKER    . MASTECTOMY Right 1970   Cancer  . SAVORY DILATION N/A 10/03/2012   Procedure: SAVORY DILATION;  Surgeon: [name], MD;  [facility];  Service: Endoscopy;  Laterality: N/A;    Allergies  Allergen Reactions  . Augmentin [Amoxicillin-Pot Clavulanate] Nausea And Vomiting  . Lactose Intolerance (Gi)     Outpatient Encounter Prescriptions as of 11/20/2016    Medication Sig  . acetaminophen (TYLENOL) 650 MG CR tablet Take 650 mg by mouth 2 (two) times daily. For pain  . carboxymethylcellulose (REFRESH PLUS) 0.5 % SOLN 2 drops 2 (two) times daily.   . Dentifrices (BIOTENE DRY MOUTH CARE DT) Place 1 spray onto teeth 4 (four) times daily.  Marland Kitchen dextromethorphan-guaiFENesin (MUCINEX DM) 30-600 MG 12hr tablet Take 1 tablet by mouth 2 (two) times daily as needed.   . Docosanol (ABREVA) 10 % CREA Apply 1 application topically 5 (five) times daily. To affected area until healed and then once daily as needed  . escitalopram (LEXAPRO) 20 MG tablet Take 20 mg by mouth daily. For depression/ anxiety.  Marland Kitchen GLUCERNA (GLUCERNA) LIQD Take 1 Can by mouth 2 (two) times daily between meals. At breakfast and lunch 1 can divided between b/f and lunch  . ipratropium-albuterol (DUONEB) 0.5-2.5 (3) MG/3ML SOLN Take 3 mLs by nebulization every 6 (six) hours as needed (cough/wheezing).  Marland Kitchen levothyroxine (SYNTHROID, LEVOTHROID) 75 MCG tablet Take 75 mcg by mouth daily before breakfast. For thyroid therapy  . liver oil-zinc oxide (DESITIN) 40 % ointment Apply 1 application topically as needed for irritation.  Marland Kitchen loratadine (CLARITIN) 10 MG tablet Take 10 mg by mouth daily.   Marland Kitchen LORazepam (ATIVAN)  0.5 MG tablet Take 0.5 mg by mouth daily as needed for anxiety.   Marland Kitchen losartan (COZAAR) 25 MG tablet Take 1 tablet (25 mg total) by mouth daily.  . Melatonin 5 MG TABS Take 5 tablets by mouth at bedtime.  . memantine (NAMENDA) 10 MG tablet Take 10 mg by mouth 2 (two) times daily.  . metoprolol tartrate (LOPRESSOR) 25 MG tablet Take 25 mg by mouth 2 (two) times daily.  . Multiple Vitamin (MULTIVITAMIN) tablet Take 1 tablet by mouth daily.  . Multiple Vitamins-Minerals (PRESERVISION AREDS PO) Take by mouth daily.  Marland Kitchen omeprazole (PRILOSEC) 20 MG capsule Take 20 mg by mouth daily.  Marland Kitchen trolamine salicylate (ASPERCREME) 10 % cream Apply 1 application topically 2 (two) times daily as needed for muscle  pain.  [place] Lank Hazel 14 % LIQD Apply topically as needed (for hemorrhoid flare up).   No facility-administered encounter medications on file as of 11/20/2016.     Review of Systems  Constitutional: Negative for activity change, appetite change, fatigue and unexpected weight change.  HENT: Positive for postnasal drip.   Eyes: Negative for visual disturbance.  Respiratory: Negative for cough, shortness of breath and wheezing.   Cardiovascular: Positive for leg swelling. Negative for chest pain.  Gastrointestinal: Negative for abdominal pain.  Endocrine: Negative.  Negative for cold intolerance and heat intolerance.  Genitourinary: Negative for dysuria.  Musculoskeletal: Positive for gait problem.  Neurological: Positive for weakness. Negative for dizziness, facial asymmetry, speech difficulty and headaches.       Weakness on right side  Hematological: Bruises/bleeds easily.  Psychiatric/Behavioral: Negative for behavioral problems, dysphoric mood and sleep disturbance.    Immunization History  Administered Date(s) Administered  . Influenza Inj Mdck Quad Pf 12/22/2015  . Influenza Whole 12/17/2012  . Influenza-Unspecified 12/08/2013, 12/16/2014  . PPD Test 07/18/2012  . Pneumococcal Conjugate-13 08/27/2015   Pertinent  Health Maintenance Due  Topic Date Due  . PNA vac Low Risk Adult (2 of 2 - PPSV23) 08/26/2016  . INFLUENZA VACCINE  10/03/2016  . OPHTHALMOLOGY EXAM  10/25/2016  . HEMOGLOBIN A1C  11/23/2016  . FOOT EXAM  01/02/2017   Fall Risk  06/27/2016 11/07/2015 01/24/2015  Falls in the past year? No No No  Risk for fall due to : - History of fall(s) -   Functional Status Survey:  dependent in ADLs except feeding herself, SNF  Vitals:   11/20/16 1501  BP: 109/69  Pulse: 80  Resp: 16  Temp: 98.1 F (36.7 C)  TempSrc: Oral  SpO2: 91%  Weight: 180 lb (81.6 kg)   Body mass index is 34.01 kg/m. Physical Exam  Constitutional: She is oriented to person, place, and  time. She appears well-developed and well-nourished.  Eyes:  glasses  Cardiovascular: Normal rate, regular rhythm, normal heart sounds and intact distal pulses.   Lower extremity edema wears compression socks  Pulmonary/Chest: Effort normal and breath sounds normal.  Abdominal: Soft. Bowel sounds are normal.  Musculoskeletal:  Seated in power chair  Neurological: She is alert and oriented to person, place, and time.  Skin: Skin is warm, dry and intact.  Psychiatric: She has a normal mood and affect. Her speech is normal and behavior is normal. Judgment and thought content normal. Cognition and memory are impaired.  MMSE 23/30 but remembers today's events and family    Labs reviewed:  Recent Labs  03/26/16 08/23/16 08/29/16  NA 142 141 140  K 4.6 4.6 4.2  BUN 19 17 15   CREATININE 0.6  0.6 0.6    Recent Labs  09/10/16  AST 42*  ALT 31  ALKPHOS 48    Recent Labs  03/26/16 09/25/16 0300  WBC 8.5 6.8  HGB 12.2 13.7  HCT 36 39  PLT 128* 130*   Lab Results  Component Value Date   TSH 1.48 08/29/2016   Lab Results  Component Value Date   HGBA1C 7.7 05/23/2016   Lab Results  Component Value Date   CHOL 135 09/19/2015   HDL 50 09/19/2015   LDLCALC 55 09/19/2015   TRIG 149 09/19/2015   CHOLHDL 4.1 07/01/2012    Assessment/Plan 1. Essential hypertension and AV block Maintain on Metoprolol and losartan. BP and HR within goal.  2. Controlled type 2 diabetes mellitus with complication, without long-term current use of insulin (HCC) Recheck A1C and manage with diet if appropriate.   3. Vascular dementia without behavioral disturbance Continue on Namenda, Seems to be stable at present.   4. Primary insomnia Continue on melatonin. Adjust temperature in room as needed for her night time comfort.  5. Depression, recurrent (HCC) Appreciate Dr Plovsky's asstistance with medication management, will differ to his reccomendations.  6. Hypothyroidism, unspecified  type Continue current dose of levothyroxine Recheck TSH yearly.    Family/ staff Communication: Discussed with SNF staff  Labs/tests ordered:  Hgb A1c   Kenneith Stief L. Alonni Heimsoth, D.O. [facility] [address],  [address] Cell Phone (Mon-Fri 8am-5pm):  [phone] On Call:  [phone] & follow prompts after 5pm & weekends Office Phone:  [phone] Office Fax:  [phone]

## 2016-11-28 DIAGNOSIS — E1121 Type 2 diabetes mellitus with diabetic nephropathy: Secondary | ICD-10-CM | POA: Diagnosis not present

## 2016-11-28 DIAGNOSIS — E119 Type 2 diabetes mellitus without complications: Secondary | ICD-10-CM | POA: Diagnosis not present

## 2016-11-28 LAB — HEMOGLOBIN A1C: HEMOGLOBIN A1C: 7.6

## 2016-12-18 DIAGNOSIS — L84 Corns and callosities: Secondary | ICD-10-CM | POA: Diagnosis not present

## 2016-12-18 DIAGNOSIS — L602 Onychogryphosis: Secondary | ICD-10-CM | POA: Diagnosis not present

## 2016-12-18 DIAGNOSIS — E1159 Type 2 diabetes mellitus with other circulatory complications: Secondary | ICD-10-CM | POA: Diagnosis not present

## 2016-12-24 ENCOUNTER — Non-Acute Institutional Stay (SKILLED_NURSING_FACILITY): Payer: Medicare Other | Admitting: Adult Health

## 2016-12-24 DIAGNOSIS — I69351 Hemiplegia and hemiparesis following cerebral infarction affecting right dominant side: Secondary | ICD-10-CM | POA: Diagnosis not present

## 2016-12-24 DIAGNOSIS — K224 Dyskinesia of esophagus: Secondary | ICD-10-CM

## 2016-12-24 DIAGNOSIS — F339 Major depressive disorder, recurrent, unspecified: Secondary | ICD-10-CM

## 2016-12-24 DIAGNOSIS — E118 Type 2 diabetes mellitus with unspecified complications: Secondary | ICD-10-CM | POA: Diagnosis not present

## 2016-12-24 DIAGNOSIS — F0150 Vascular dementia without behavioral disturbance: Secondary | ICD-10-CM

## 2016-12-24 DIAGNOSIS — R635 Abnormal weight gain: Secondary | ICD-10-CM

## 2017-01-01 ENCOUNTER — Encounter: Payer: Self-pay | Admitting: Adult Health

## 2017-01-01 NOTE — Progress Notes (Signed)
Location:  Occupational psychologist of Service:  SNF (31) Provider:   Cindi Carbon, ANP [facility] [phone]   [name], DO  Patient Care Team: [name], DO as PCP - General (Geriatric Medicine) Community, [facility] [name], Altamese Dilling, MD as Attending Physician (Gastroenterology) Garvin Fila, MD as Consulting Physician (Neurology)  Extended Emergency Contact Information Primary Emergency Contact: Shirley Address: [address]          [place], [place] [address] [name] of [place] Phone: [phone] Mobile Phone: [phone] Relation: Daughter  Code Status:  DNR Goals of care: Advanced Directive information Advanced Directives 01/01/2017  Does Patient Have a Medical Advance Directive? Yes  Type of Advance Directive Out of facility DNR (pink MOST or yellow form);[place];Living will  Does patient want to make changes to medical advance directive? -  Copy of [place] in Chart? Yes  Pre-existing out of facility DNR order (yellow form or pink MOST form) Yellow form placed in chart (order not valid for inpatient use)     Chief Complaint  Patient presents with  . Medical Management of Chronic Issues    HPI:  Pt is a 83 y.o. female seen today for medical management of chronic diseases.  She resides in skilled care at [facility] due to a CVA with right hemiparesis as well as vascular dementia.   Weight gain: steadily gaining weight due to intake and edema  DM II: not on medication, has diet non compliance Lab Results  Component Value Date   HGBA1C 7.6 11/28/2016   Esophageal dysmotility: continues on baby food diet with concessions of gold fish and hershey kisses (POA is aware of the risk)  Despite this she is doing quite well and denies any respiratory symptoms  Depression: She is not having symptoms of appetite loss, sadness, and lack of sleep She has  occasional periods of anxiety, but apparently she did not end up seeing Dr. Casimiro Needle as it was felt that it was no necessary She remains on lexapro.  Previously she was on ativan but this was discontinued due to non use.  Insomnia: sleeps fairly well with melatonin  Vascular dementia: MMSE 23/30 on 06/27/16 on namenda     Past Medical History:  Diagnosis Date  . Abnormality of gait 2007  . Acquired cyst of kidney 2002  . Acute sinus infection 11/25/2012  . Allergic rhinitis due to pollen   . Altered mental status 11/28/2012  . Anxiety state, unspecified   . Atrioventricular block, complete ([place]) 2003   s/p PPM 2003, generator chnage 2011  . Cardiac pacemaker in situ 2003  . Coronary atherosclerosis of native coronary artery 2003   non obstructive by Cath 2003  . CVA (cerebral infarction) 06/28/2012   rt hemiparesis, dysphagia  . Dendritic keratitis 2012  . Dysuria   . Edema 2003  . Esophageal dysmotility 08/19/2012  . Food impaction of esophagus 08/15/2012  . HTN (hypertension) 06/28/2012  . Hypertension   . Macular degeneration (senile) of retina, unspecified 2012  . Malignant neoplasm of breast (female), unspecified site 1970   S/P Rt radical mastectomy  . NSTEMI (non-ST elevated myocardial infarction) ([place]) 06/28/2012  . Osteoarthrosis, unspecified whether generalized or localized, unspecified site 2013  . Pacemaker   . Personal history of colonic polyps     s/p colonoscopy/polypectomy 2003  . Pure hyperglyceridemia 2004  . Senile osteoporosis 2003  . Stroke ([place]) 06/28/2012  . Tinnitus  2007  . Type II or unspecified type diabetes mellitus without mention of complication, not stated as uncontrolled 2003  . Unspecified arthropathy, pelvic region and thigh 2010  . Unspecified glaucoma(365.9) 2013  . Unspecified hypothyroidism 2009  . Unspecified vitamin D deficiency 2009  . Urinary retention 07/07/2012   Past Surgical History:  Procedure Laterality Date  . BOTOX INJECTION  N/A 10/03/2012   Procedure: BOTOX INJECTION;  Surgeon: [name], MD;  Location: [facility] ENDOSCOPY;  Service: Endoscopy;  Laterality: N/A;  . ESOPHAGOGASTRODUODENOSCOPY N/A 08/15/2012   Procedure: ESOPHAGOGASTRODUODENOSCOPY (EGD);  Surgeon: [name], MD;  Location: [facility] ENDOSCOPY;  Service: Endoscopy;  Laterality: N/A;  . ESOPHAGOGASTRODUODENOSCOPY N/A 08/15/2012   Procedure: ESOPHAGOGASTRODUODENOSCOPY (EGD);  Surgeon: [name], MD;  Location: [place];  Service: Endoscopy;  Laterality: N/A;  . ESOPHAGOGASTRODUODENOSCOPY N/A 10/03/2012   Procedure: ESOPHAGOGASTRODUODENOSCOPY (EGD);  Surgeon: [name], MD;  Location: [facility] ENDOSCOPY;  Service: Endoscopy;  Laterality: N/A;  . ESOPHAGOGASTRODUODENOSCOPY N/A 12/22/2012   Procedure: ESOPHAGOGASTRODUODENOSCOPY (EGD);  Surgeon: [name], MD;  Location: [facility] ENDOSCOPY;  Service: Endoscopy;  Laterality: N/A;  . ESOPHAGOSCOPY N/A 08/15/2012   Procedure: ESOPHAGOSCOPY;  Surgeon: [name], MD;  Location: [place];  Service: ENT;  Laterality: N/A;  . FOREIGN BODY REMOVAL ESOPHAGEAL N/A 08/15/2012   Procedure: REMOVAL FOREIGN BODY ESOPHAGEAL;  Surgeon: [name], MD;  Location: [place];  Service: ENT;  Laterality: N/A;  . INSERT / REPLACE / REMOVE PACEMAKER    . MASTECTOMY Right 1970   Cancer  . SAVORY DILATION N/A 10/03/2012   Procedure: SAVORY DILATION;  Surgeon: [name], MD;  Location: [facility] ENDOSCOPY;  Service: Endoscopy;  Laterality: N/A;    Allergies  Allergen Reactions  . Augmentin [Amoxicillin-Pot Clavulanate] Nausea And Vomiting  . Lactose Intolerance (Gi)     Outpatient Encounter Prescriptions as of 12/24/2016  Medication Sig  . acetaminophen (TYLENOL) 650 MG CR tablet Take 650 mg by mouth 2 (two) times daily. For pain  . carboxymethylcellulose (REFRESH PLUS) 0.5 % SOLN 2 drops 2 (two) times daily.   . Dentifrices (BIOTENE DRY MOUTH CARE DT) Place 1 spray onto teeth 4 (four) times daily.  Marland Kitchen dextromethorphan-guaiFENesin (MUCINEX DM) 30-600 MG  12hr tablet Take 1 tablet by mouth 2 (two) times daily as needed.   . Docosanol (ABREVA) 10 % CREA Apply 1 application topically 5 (five) times daily. To affected area until healed and then once daily as needed  . escitalopram (LEXAPRO) 20 MG tablet Take 20 mg by mouth daily. For depression/ anxiety.  Marland Kitchen GLUCERNA (GLUCERNA) LIQD Take 1 Can by mouth 2 (two) times daily between meals. At breakfast and lunch 1 can divided between b/f and lunch  . ipratropium-albuterol (DUONEB) 0.5-2.5 (3) MG/3ML SOLN Take 3 mLs by nebulization every 6 (six) hours as needed (cough/wheezing).  Marland Kitchen levothyroxine (SYNTHROID, LEVOTHROID) 75 MCG tablet Take 75 mcg by mouth daily before breakfast. For thyroid therapy  . liver oil-zinc oxide (DESITIN) 40 % ointment Apply 1 application topically as needed for irritation.  Marland Kitchen loratadine (CLARITIN) 10 MG tablet Take 10 mg by mouth daily.   Marland Kitchen losartan (COZAAR) 25 MG tablet Take 1 tablet (25 mg total) by mouth daily.  . Melatonin 5 MG TABS Take 5 tablets by mouth at bedtime.  . memantine (NAMENDA) 10 MG tablet Take 10 mg by mouth 2 (two) times daily.  . metoprolol tartrate (LOPRESSOR) 25 MG tablet Take 25 mg by mouth 2 (two) times daily.  Marland Kitchen  Multiple Vitamin (MULTIVITAMIN) tablet Take 1 tablet by mouth daily.  . Multiple Vitamins-Minerals (PRESERVISION AREDS PO) Take by mouth daily.  Marland Kitchen omeprazole (PRILOSEC) 20 MG capsule Take 20 mg by mouth daily.  Marland Kitchen trolamine salicylate (ASPERCREME) 10 % cream Apply 1 application topically 2 (two) times daily as needed for muscle pain.  Addison Lank Hazel 14 % LIQD Apply topically as needed (for hemorrhoid flare up).  . [DISCONTINUED] LORazepam (ATIVAN) 0.5 MG tablet Take 0.5 mg by mouth daily as needed for anxiety.    No facility-administered encounter medications on file as of 12/24/2016.     Review of Systems  Constitutional: Positive for unexpected weight change (steady gain). Negative for activity change, appetite change, chills, diaphoresis,  fatigue and fever.  HENT: Positive for trouble swallowing. Negative for congestion.   Eyes: Negative for visual disturbance.  Respiratory: Negative for cough, shortness of breath and wheezing.   Cardiovascular: Positive for leg swelling. Negative for chest pain and palpitations.       Pacemaker  Gastrointestinal: Negative for abdominal distention, abdominal pain, constipation and diarrhea.  Genitourinary: Negative for difficulty urinating and dysuria.  Musculoskeletal: Positive for gait problem. Negative for arthralgias, back pain, joint swelling and myalgias.  Neurological: Positive for weakness. Negative for dizziness, tremors, seizures, syncope, facial asymmetry, speech difficulty, light-headedness, numbness and headaches.  Psychiatric/Behavioral: Positive for confusion. Negative for agitation, behavioral problems, dysphoric mood and sleep disturbance.    Immunization History  Administered Date(s) Administered  . Influenza Inj Mdck Quad Pf 12/22/2015  . Influenza Whole 12/17/2012  . Influenza-Unspecified 12/08/2013, 12/16/2014  . PPD Test 07/18/2012  . Pneumococcal Conjugate-13 08/27/2015   Pertinent  Health Maintenance Due  Topic Date Due  . PNA vac Low Risk Adult (2 of 2 - PPSV23) 08/26/2016  . INFLUENZA VACCINE  10/03/2016  . OPHTHALMOLOGY EXAM  10/25/2016  . FOOT EXAM  01/02/2017  . HEMOGLOBIN A1C  05/28/2017   Fall Risk  06/27/2016 11/07/2015 01/24/2015  Falls in the past year? No No No  Risk for fall due to : - History of fall(s) -   Functional Status Survey:    Vitals:   01/01/17 1107  BP: 114/79  Pulse: 79  Resp: 20  Temp: (!) 97.4 F (36.3 C)  SpO2: 90%  Weight: 183 lb 6.4 oz (83.2 kg)   Body mass index is 34.65 kg/m.  Wt Readings from Last 3 Encounters:  01/01/17 183 lb 6.4 oz (83.2 kg)  11/20/16 180 lb (81.6 kg)  10/16/16 179 lb (81.2 kg)    Physical Exam  Constitutional: She is oriented to person, place, and time. No distress.  HENT:  Head:  Normocephalic and atraumatic.  Mouth/Throat: No oropharyngeal exudate.  Eyes: Pupils are equal, round, and reactive to light. Conjunctivae are normal. Right eye exhibits no discharge. Left eye exhibits no discharge.  Neck: No JVD present.  Cardiovascular: Normal rate and regular rhythm.   No murmur heard. BLE edema +1 (no redness)  Pulmonary/Chest: Effort normal and breath sounds normal. No respiratory distress. She has no wheezes.  Abdominal: Soft. Bowel sounds are normal.  Musculoskeletal:  Right sided weakness  Neurological: She is alert and oriented to person, place, and time.  Skin: Skin is warm and dry. She is not diaphoretic.  Psychiatric: She has a normal mood and affect.    Labs reviewed:  Recent Labs  03/26/16 08/23/16 08/29/16  NA 142 141 140  K 4.6 4.6 4.2  BUN 19 17 15   CREATININE 0.6 0.6 0.6  Recent Labs  09/10/16  AST 42*  ALT 31  ALKPHOS 48    Recent Labs  03/26/16 09/25/16 0300  WBC 8.5 6.8  HGB 12.2 13.7  HCT 36 39  PLT 128* 130*   Lab Results  Component Value Date   TSH 1.48 08/29/2016   Lab Results  Component Value Date   HGBA1C 7.6 11/28/2016   Lab Results  Component Value Date   CHOL 135 09/19/2015   HDL 50 09/19/2015   LDLCALC 55 09/19/2015   TRIG 149 09/19/2015   CHOLHDL 4.1 07/01/2012    Significant Diagnostic Results in last 30 days:  No results found.  Assessment/Plan 1. Vascular dementia without behavioral disturbance Continues to participate in activities and use her scooter I believe she stills benefits from the [place] so we will continue it  2. Controlled type 2 diabetes mellitus with complication, without long-term current use of insulin (HCC) A1C q 6 months She should avoid sugary items but given her restrictive baby food we understand that she makes concessions at times as a quality of life choice  3. Hemiparesis affecting right side as late effect of cerebrovascular accident [facility]) She has some edema in her  right arm that the staff elevates which seems to help.  BP controlled No lipid monitoring due to goals of care  4. Depression, recurrent ([place]) Continue lexapro due to occasional periods of anxiety  5. Weight gain Diet as discussed above Continue monitor weight, which did not respond to diuresis well previously.  We could consider this again if SOB develops. Her goals of care are comfort based at this point due to her poor quality of life.  6. Esophageal dysmotility Continue baby food diet and asp prec   Family/ staff Communication: discussed with resident Labs/tests ordered:  NA

## 2017-01-18 ENCOUNTER — Non-Acute Institutional Stay (SKILLED_NURSING_FACILITY): Payer: Medicare Other | Admitting: Adult Health

## 2017-01-18 DIAGNOSIS — R635 Abnormal weight gain: Secondary | ICD-10-CM | POA: Diagnosis not present

## 2017-01-18 DIAGNOSIS — E118 Type 2 diabetes mellitus with unspecified complications: Secondary | ICD-10-CM | POA: Diagnosis not present

## 2017-01-18 DIAGNOSIS — F0150 Vascular dementia without behavioral disturbance: Secondary | ICD-10-CM

## 2017-01-18 DIAGNOSIS — E039 Hypothyroidism, unspecified: Secondary | ICD-10-CM | POA: Diagnosis not present

## 2017-01-18 DIAGNOSIS — J3089 Other allergic rhinitis: Secondary | ICD-10-CM | POA: Diagnosis not present

## 2017-01-18 DIAGNOSIS — I890 Lymphedema, not elsewhere classified: Secondary | ICD-10-CM

## 2017-01-18 DIAGNOSIS — I442 Atrioventricular block, complete: Secondary | ICD-10-CM | POA: Diagnosis not present

## 2017-01-18 NOTE — Progress Notes (Signed)
Location:  Occupational psychologist of Service:  SNF (31) Provider:   Cindi Carbon, ANP [place] [phone]   [name], DO  Patient Care Team: [name], DO as PCP - General (Geriatric Medicine) Community, Well [name], Altamese Dilling, MD as Attending Physician (Gastroenterology) Garvin Fila, MD as Consulting Physician (Neurology)  Extended Emergency Contact Information Primary Emergency Contact: [place] Address: [address]          [place], [place] [address] [name] of [place] Phone: [phone] Mobile Phone: [phone] Relation: Daughter  Code Status:  DNR Goals of care: Advanced Directive information Advanced Directives 01/01/2017  Does Patient Have a Medical Advance Directive? Yes  Type of Advance Directive Out of facility DNR (pink MOST or yellow form);[place];Living will  Does patient want to make changes to medical advance directive? -  Copy of [place] in Chart? Yes  Pre-existing out of facility DNR order (yellow form or pink MOST form) Yellow form placed in chart (order not valid for inpatient use)     Chief Complaint  Patient presents with  . Medical Management of Chronic Issues    HPI:  Pt is a 83 y.o. female seen today for medical management of chronic diseases.  She resides in skilled care at [facility] due to a CVA with right hemiparesis as well as vascular dementia.   Hypothyroidism:  Lab Results  Component Value Date   TSH 1.48 08/29/2016   Claritin: reports itchy watery eyes and noses periodically.  Not present today. Currently on claritin  Weight gain: steadily gaining weight due to intake and edema. Denies shortness of breath or chest pain Wt Readings from Last 3 Encounters:  01/21/17 184 lb 9.6 oz (83.7 kg)  01/01/17 183 lb 6.4 oz (83.2 kg)  11/20/16 180 lb (81.6 kg)   Right arm edema: hx of mastectomy. She declined a  compression sleeve. She feels the edema is better with elevation.  (also has right sided weakness due to CVA)  DM II: not on medication, has diet non compliance Lab Results  Component Value Date   HGBA1C 7.6 11/28/2016   Pacemaker due to complete heart block  Vascular dementia: MMSE 23/30 on 06/27/16 on namenda     Past Medical History:  Diagnosis Date  . Abnormality of gait 2007  . Acquired cyst of kidney 2002  . Acute sinus infection 11/25/2012  . Allergic rhinitis due to pollen   . Altered mental status 11/28/2012  . Anxiety state, unspecified   . Atrioventricular block, complete ([place]) 2003   s/p PPM 2003, generator chnage 2011  . Cardiac pacemaker in situ 2003  . Coronary atherosclerosis of native coronary artery 2003   non obstructive by Cath 2003  . CVA (cerebral infarction) 06/28/2012   rt hemiparesis, dysphagia  . Dendritic keratitis 2012  . Dysuria   . Edema 2003  . Esophageal dysmotility 08/19/2012  . Food impaction of esophagus 08/15/2012  . HTN (hypertension) 06/28/2012  . Hypertension   . Macular degeneration (senile) of retina, unspecified 2012  . Malignant neoplasm of breast (female), unspecified site 1970   S/P Rt radical mastectomy  . NSTEMI (non-ST elevated myocardial infarction) ([place]) 06/28/2012  . Osteoarthrosis, unspecified whether generalized or localized, unspecified site 2013  . Pacemaker   . Personal history of colonic polyps     s/p colonoscopy/polypectomy 2003  . Pure hyperglyceridemia 2004  . Senile osteoporosis 2003  . Stroke ([place]) 06/28/2012  .  Tinnitus 2007  . Type II or unspecified type diabetes mellitus without mention of complication, not stated as uncontrolled 2003  . Unspecified arthropathy, pelvic region and thigh 2010  . Unspecified glaucoma(365.9) 2013  . Unspecified hypothyroidism 2009  . Unspecified vitamin D deficiency 2009  . Urinary retention 07/07/2012   Past Surgical History:  Procedure Laterality Date  . BOTOX INJECTION N/A  10/03/2012   Performed by [name], MD at [facility]  . ESOPHAGOGASTRODUODENOSCOPY (EGD) N/A 12/22/2012   Performed by [name], MD at [place]  . ESOPHAGOGASTRODUODENOSCOPY (EGD) N/A 10/03/2012   Performed by [name], MD at [facility]  . ESOPHAGOGASTRODUODENOSCOPY (EGD) N/A 08/15/2012   Performed by [name], MD at [facility]  . ESOPHAGOGASTRODUODENOSCOPY (EGD) N/A 08/15/2012   Performed by [name], MD at [facility]  . ESOPHAGOSCOPY N/A 08/15/2012   Performed by [name], MD at [facility]  . INSERT / REPLACE / REMOVE PACEMAKER    . MASTECTOMY Right 1970   Cancer  . REMOVAL FOREIGN BODY ESOPHAGEAL N/A 08/15/2012   Performed by [name], MD at [facility]  . SAVORY DILATION N/A 10/03/2012   Performed by [name], MD at [facility] [facility]  Allergen Reactions  . Augmentin [Amoxicillin-Pot Clavulanate] Nausea And Vomiting  . Lactose Intolerance (Gi)     Outpatient Encounter Medications as of 01/18/2017  Medication Sig  . acetaminophen (TYLENOL) 650 MG CR tablet Take 650 mg by mouth 2 (two) times daily. For pain  . carboxymethylcellulose (REFRESH PLUS) 0.5 % SOLN 2 drops 2 (two) times daily.   . Dentifrices (BIOTENE DRY MOUTH CARE DT) Place 1 spray onto teeth 4 (four) times daily.  Marland Kitchen dextromethorphan-guaiFENesin (MUCINEX DM) 30-600 MG 12hr tablet Take 1 tablet by mouth 2 (two) times daily as needed.   . Docosanol (ABREVA) 10 % CREA Apply 1 application topically 5 (five) times daily. To affected area until healed and then once daily as needed  . escitalopram (LEXAPRO) 20 MG tablet Take 20 mg by mouth daily. For depression/ anxiety.  Marland Kitchen GLUCERNA (GLUCERNA) LIQD Take 1 Can by mouth 2 (two) times daily between meals. At breakfast and lunch 1 can divided between b/f and lunch  . ipratropium-albuterol (DUONEB) 0.5-2.5 (3) MG/3ML SOLN Take 3 mLs by nebulization every 6 (six) hours as needed (cough/wheezing).  Marland Kitchen levothyroxine (SYNTHROID, LEVOTHROID) 75 MCG tablet  Take 75 mcg by mouth daily before breakfast. For thyroid therapy  . liver oil-zinc oxide (DESITIN) 40 % ointment Apply 1 application topically as needed for irritation.  Marland Kitchen loratadine (CLARITIN) 10 MG tablet Take 10 mg by mouth daily.   Marland Kitchen losartan (COZAAR) 25 MG tablet Take 1 tablet (25 mg total) by mouth daily.  . Melatonin 5 MG TABS Take 5 tablets by mouth at bedtime.  . memantine (NAMENDA) 10 MG tablet Take 10 mg by mouth 2 (two) times daily.  . metoprolol tartrate (LOPRESSOR) 25 MG tablet Take 25 mg by mouth 2 (two) times daily.  . Multiple Vitamin (MULTIVITAMIN) tablet Take 1 tablet by mouth daily.  . Multiple Vitamins-Minerals (PRESERVISION AREDS PO) Take by mouth daily.  Marland Kitchen omeprazole (PRILOSEC) 20 MG capsule Take 20 mg by mouth daily.  Marland Kitchen trolamine salicylate (ASPERCREME) 10 % cream Apply 1 application topically 2 (two) times daily as needed for muscle pain.  Addison Lank Hazel 14 % LIQD Apply topically as needed (for hemorrhoid flare up).   No facility-administered encounter medications  on file as of 01/18/2017.       Immunization History  Administered Date(s) Administered  . Influenza Inj Mdck Quad Pf 12/22/2015  . Influenza Whole 12/17/2012  . Influenza-Unspecified 12/08/2013, 12/16/2014  . PPD Test 07/18/2012  . Pneumococcal Conjugate-13 08/27/2015   Pertinent  Health Maintenance Due  Topic Date Due  . PNA vac Low Risk Adult (2 of 2 - PPSV23) 08/26/2016  . OPHTHALMOLOGY EXAM  10/25/2016  . FOOT EXAM  01/02/2017  . HEMOGLOBIN A1C  05/28/2017  . INFLUENZA VACCINE  Completed   Fall Risk  06/27/2016 11/07/2015 01/24/2015  Falls in the past year? No No No  Risk for fall due to : - History of fall(s) -   Functional Status Survey:    Vitals:   01/21/17 1626  BP: 140/79  Pulse: 86  Resp: 20  Temp: (!) 97.5 F (36.4 C)  SpO2: 93%  Weight: 184 lb 9.6 oz (83.7 kg)   Body mass index is 34.88 kg/m.  Wt Readings from Last 3 Encounters:  01/21/17 184 lb 9.6 oz (83.7 kg)    01/01/17 183 lb 6.4 oz (83.2 kg)  11/20/16 180 lb (81.6 kg)    Review of Systems  Constitutional: Negative for chills, diaphoresis, fever, malaise/fatigue and weight loss.  Respiratory: Negative for cough, hemoptysis, sputum production and wheezing.   Cardiovascular: Positive for leg swelling. Negative for chest pain and PND.  Gastrointestinal: Negative for abdominal pain, constipation, diarrhea, nausea and vomiting.  Genitourinary: Negative for dysuria and urgency.  Musculoskeletal: Negative for back pain, falls, joint pain, myalgias and neck pain.  Skin: Negative for itching and rash.  Neurological: Positive for focal weakness. Negative for weakness.  Psychiatric/Behavioral: Positive for memory loss. Negative for depression.   Physical Exam  Constitutional: She is oriented to person, place, and time. No distress.  HENT:  Head: Normocephalic and atraumatic.  Neck: No JVD present.  Cardiovascular: Normal rate and regular rhythm.  No murmur heard. Right arm edema improved, still present in the upper arm and elbow area.+ right radial pulse.   BLE edema +2  Pulmonary/Chest: Effort normal and breath sounds normal. No respiratory distress. She has no wheezes.  Abdominal: Soft. Bowel sounds are normal.  Neurological: She is alert and oriented to person, place, and time.  Skin: Skin is warm and dry. She is not diaphoretic.  Psychiatric: She has a normal mood and affect.    Labs reviewed: Recent Labs    03/26/16 08/23/16 08/29/16  NA 142 141 140  K 4.6 4.6 4.2  BUN 19 17 15   CREATININE 0.6 0.6 0.6   Recent Labs    09/10/16  AST 42*  ALT 31  ALKPHOS 48   Recent Labs    03/26/16 09/25/16 0300  WBC 8.5 6.8  HGB 12.2 13.7  HCT 36 39  PLT 128* 130*   Lab Results  Component Value Date   TSH 1.48 08/29/2016   Lab Results  Component Value Date   HGBA1C 7.6 11/28/2016   Lab Results  Component Value Date   CHOL 135 09/19/2015   HDL 50 09/19/2015   LDLCALC 55  09/19/2015   TRIG 149 09/19/2015   CHOLHDL 4.1 07/01/2012    Significant Diagnostic Results in last 30 days:  No results found.  Assessment/Plan  Hypothyroidism Check TSH annually. Continue synthroid 75 mcg qd  Controlled type 2 diabetes mellitus with complication, without long-term current use of insulin (HCC) A1C has risen over time due to diet. She is also gaining  weight. We will continue to monitor her A1C with a goal of <8% due to her goals of care. She is due for a diabetic eye exam.  Vascular dementia without behavioral disturbance Continue Namenda. Fairly stable function over the past year.   Weight gain She continues to have edema in her arm and both legs. It did not respond well to lasix. She is not short of breath and would like to avoid frequent urination.  She also is gaining weight due to diet non compliance. We may consider torsemide in the future if she continues to gain weight but at this point she is not short of breath and has multiple reasons for the edema so we will hold off.  Atrioventricular block, complete Has a pacemaker and followed remotely  Lymphedema of arm Continue to keep right arm elevated when possible.   Allergic rhinitis She has periods of symptoms so would continue the Claritin to prevent flares.    Family/ staff Communication: discussed with resident Labs/tests ordered:  NA

## 2017-01-21 ENCOUNTER — Encounter: Payer: Self-pay | Admitting: Adult Health

## 2017-01-21 DIAGNOSIS — J309 Allergic rhinitis, unspecified: Secondary | ICD-10-CM | POA: Insufficient documentation

## 2017-01-21 NOTE — Assessment & Plan Note (Signed)
Has a pacemaker and followed remotely

## 2017-01-21 NOTE — Assessment & Plan Note (Signed)
Check TSH annually. Continue synthroid 75 mcg qd

## 2017-01-21 NOTE — Assessment & Plan Note (Signed)
Continue Namenda. Fairly stable function over the past year.

## 2017-01-21 NOTE — Assessment & Plan Note (Signed)
She continues to have edema in her arm and both legs. It did not respond well to lasix. She is not short of breath and would like to avoid frequent urination.  She also is gaining weight due to diet non compliance. We may consider torsemide in the future if she continues to gain weight but at this point she is not short of breath and has multiple reasons for the edema so we will hold off.

## 2017-01-21 NOTE — Assessment & Plan Note (Signed)
Continue to keep right arm elevated when possible.

## 2017-01-21 NOTE — Assessment & Plan Note (Signed)
She has periods of symptoms so would continue the Claritin to prevent flares.

## 2017-01-21 NOTE — Assessment & Plan Note (Signed)
A1C has risen over time due to diet. She is also gaining weight. We will continue to monitor her A1C with a goal of <8% due to her goals of care. She is due for a diabetic eye exam.

## 2017-02-06 ENCOUNTER — Ambulatory Visit (INDEPENDENT_AMBULATORY_CARE_PROVIDER_SITE_OTHER): Payer: Medicare Other | Admitting: *Deleted

## 2017-02-06 DIAGNOSIS — I442 Atrioventricular block, complete: Secondary | ICD-10-CM

## 2017-02-07 ENCOUNTER — Encounter: Payer: Self-pay | Admitting: Cardiology

## 2017-02-07 NOTE — Progress Notes (Signed)
Remote pacemaker transmission.   

## 2017-02-13 LAB — CUP PACEART REMOTE DEVICE CHECK
Battery Impedance: 885 Ohm
Brady Statistic AP VP Percent: 0 %
Brady Statistic AS VP Percent: 100 %
Brady Statistic AS VS Percent: 0 %
Date Time Interrogation Session: 20181205195425
Implantable Lead Implant Date: 20110506
Implantable Lead Location: 753860
Implantable Lead Model: 5076
Implantable Pulse Generator Implant Date: 20110506
Lead Channel Impedance Value: 456 Ohm
Lead Channel Pacing Threshold Amplitude: 1 V
Lead Channel Setting Pacing Amplitude: 2 V
Lead Channel Setting Sensing Sensitivity: 2.8 mV
MDC IDC LEAD IMPLANT DT: 20110506
MDC IDC LEAD LOCATION: 753859
MDC IDC MSMT BATTERY REMAINING LONGEVITY: 60 mo
MDC IDC MSMT BATTERY VOLTAGE: 2.78 V
MDC IDC MSMT LEADCHNL RA IMPEDANCE VALUE: 481 Ohm
MDC IDC MSMT LEADCHNL RA PACING THRESHOLD AMPLITUDE: 0.75 V
MDC IDC MSMT LEADCHNL RA PACING THRESHOLD PULSEWIDTH: 0.4 ms
MDC IDC MSMT LEADCHNL RV PACING THRESHOLD PULSEWIDTH: 0.4 ms
MDC IDC SET LEADCHNL RV PACING AMPLITUDE: 2.5 V
MDC IDC SET LEADCHNL RV PACING PULSEWIDTH: 0.4 ms
MDC IDC STAT BRADY AP VS PERCENT: 0 %

## 2017-02-18 ENCOUNTER — Encounter: Payer: Self-pay | Admitting: Adult Health

## 2017-02-18 ENCOUNTER — Non-Acute Institutional Stay (SKILLED_NURSING_FACILITY): Payer: Medicare Other | Admitting: Adult Health

## 2017-02-18 DIAGNOSIS — K224 Dyskinesia of esophagus: Secondary | ICD-10-CM | POA: Diagnosis not present

## 2017-02-18 DIAGNOSIS — I890 Lymphedema, not elsewhere classified: Secondary | ICD-10-CM | POA: Diagnosis not present

## 2017-02-18 DIAGNOSIS — F0150 Vascular dementia without behavioral disturbance: Secondary | ICD-10-CM

## 2017-02-18 DIAGNOSIS — R635 Abnormal weight gain: Secondary | ICD-10-CM

## 2017-02-18 DIAGNOSIS — J3089 Other allergic rhinitis: Secondary | ICD-10-CM | POA: Diagnosis not present

## 2017-02-18 DIAGNOSIS — F5101 Primary insomnia: Secondary | ICD-10-CM | POA: Diagnosis not present

## 2017-02-18 DIAGNOSIS — I10 Essential (primary) hypertension: Secondary | ICD-10-CM | POA: Diagnosis not present

## 2017-02-18 NOTE — Assessment & Plan Note (Signed)
Continue melatonin 5 mg qhs 

## 2017-02-18 NOTE — Assessment & Plan Note (Signed)
Controlled, continue current medication 

## 2017-02-18 NOTE — Assessment & Plan Note (Signed)
Continue modified diet and asp prec. She is at risk for food impaction and aspiration due to diet non compliance and has been educated regarding this issue.

## 2017-02-18 NOTE — Assessment & Plan Note (Signed)
Resident request to wear compression sleeve each evening

## 2017-02-18 NOTE — Assessment & Plan Note (Signed)
Weight continues to rise due to diet choices. She has been educated on making better choices but given her age of 38 and poor quality of life we will not push this issue.

## 2017-02-18 NOTE — Assessment & Plan Note (Signed)
She is continues to be able to operate a motorized scooter, communicate, feed herself, etc. She still benefits from [place] it will be continued.

## 2017-02-18 NOTE — Progress Notes (Signed)
Location:  Occupational psychologist of Service:  SNF (31) Provider:   Cindi Carbon, ANP [place] [phone]   [name], DO  Patient Care Team: [name], DO as PCP - General (Geriatric Medicine) [name], Altamese Dilling, MD as Attending Physician (Gastroenterology) Garvin Fila, MD as Consulting Physician (Neurology)  Extended Emergency Contact Information Primary Emergency Contact: [place] Address: [address]          [place], [place] [address] [name] of [place] Phone: [phone] Mobile Phone: [phone] Relation: Daughter  Code Status:  DNR Goals of care: Advanced Directive information Advanced Directives 01/01/2017  Does Patient Have a Medical Advance Directive? Yes  Type of Advance Directive Out of facility DNR (pink MOST or yellow form);[place];Living will  Does patient want to make changes to medical advance directive? -  Copy of [place] in Chart? Yes  Pre-existing out of facility DNR order (yellow form or pink MOST form) Yellow form placed in chart (order not valid for inpatient use)     Chief Complaint  Patient presents with  . Medical Management of Chronic Issues    HPI:  Pt is a 83 y.o. female seen today for medical management of chronic diseases.  She resides in skilled care at [facility] due to a CVA with right hemiparesis as well as vascular dementia.   She reports a chronic post nasal drip, no headaches or nasal stuffiness. No sore throat or fever. She also has a hx of itchy watery eyes and takes claritin.   Weight gain: steadily gaining weight and eats chocolate and gold fish regularly. Recently she has been drinking sprite.  Right arm edema: hx of mastectomy. Edema worse today. She would like to try the compression sleeve at night.   HTN: BP controlled with cozaar and lopressor  Esophageal dysmotility with hx of  impaction: eats baby food and drinks glucerna. She eats foods that are not recommended for quality of life reasons.  Vascular dementia: MMSE 23/30 on 06/27/16 on namenda.  Her memory loss has been fairly stable over the past 6 months.   Insomnia: sleeps well per resident, takes melatonin  Functional status: hoyer lift, incontinent   Past Medical History:  Diagnosis Date  . Abnormality of gait 2007  . Acquired cyst of kidney 2002  . Acute sinus infection 11/25/2012  . Allergic rhinitis due to pollen   . Altered mental status 11/28/2012  . Anxiety state, unspecified   . Atrioventricular block, complete ([place]) 2003   s/p PPM 2003, generator chnage 2011  . Cardiac pacemaker in situ 2003  . Coronary atherosclerosis of native coronary artery 2003   non obstructive by Cath 2003  . CVA (cerebral infarction) 06/28/2012   rt hemiparesis, dysphagia  . Dendritic keratitis 2012  . Dysuria   . Edema 2003  . Esophageal dysmotility 08/19/2012  . Food impaction of esophagus 08/15/2012  . HTN (hypertension) 06/28/2012  . Hypertension   . Macular degeneration (senile) of retina, unspecified 2012  . Malignant neoplasm of breast (female), unspecified site 1970   S/P Rt radical mastectomy  . NSTEMI (non-ST elevated myocardial infarction) ([place]) 06/28/2012  . Osteoarthrosis, unspecified whether generalized or localized, unspecified site 2013  . Pacemaker   . Personal history of colonic polyps     s/p colonoscopy/polypectomy 2003  . Pure hyperglyceridemia 2004  . Senile osteoporosis 2003  . Stroke ([place]) 06/28/2012  . Tinnitus 2007  .  Type II or unspecified type diabetes mellitus without mention of complication, not stated as uncontrolled 2003  . Unspecified arthropathy, pelvic region and thigh 2010  . Unspecified glaucoma(365.9) 2013  . Unspecified hypothyroidism 2009  . Unspecified vitamin D deficiency 2009  . Urinary retention 07/07/2012   Past Surgical History:  Procedure Laterality Date  . BOTOX  INJECTION N/A 10/03/2012   Procedure: BOTOX INJECTION;  Surgeon: [name], MD;  Location: [facility] ENDOSCOPY;  Service: Endoscopy;  Laterality: N/A;  . ESOPHAGOGASTRODUODENOSCOPY N/A 08/15/2012   Procedure: ESOPHAGOGASTRODUODENOSCOPY (EGD);  Surgeon: [name], MD;  Location[facility] ENDOSCOPY;  Service: Endoscopy;  Laterality: N/A;  . ESOPHAGOGASTRODUODENOSCOPY N/A 08/15/2012   Procedure: ESOPHAGOGASTRODUODENOSCOPY (EGD);  Surgeon: [name], MD;  Location: [place];  Service: Endoscopy;  Laterality: N/A;  . ESOPHAGOGASTRODUODENOSCOPY N/A 10/03/2012   Procedure: ESOPHAGOGASTRODUODENOSCOPY (EGD);  Surgeon: [name], MD;  Location: [facility];  Service: Endoscopy;  Laterality: N/A;  . ESOPHAGOGASTRODUODENOSCOPY N/A 12/22/2012   Procedure: ESOPHAGOGASTRODUODENOSCOPY (EGD);  Surgeon: [name], MD;  Location: [facility] ENDOSCOPY;  Service: Endoscopy;  Laterality: N/A;  . ESOPHAGOSCOPY N/A 08/15/2012   Procedure: ESOPHAGOSCOPY;  Surgeon: [name], MD;  Location: [place];  Service: ENT;  Laterality: N/A;  . FOREIGN BODY REMOVAL ESOPHAGEAL N/A 08/15/2012   Procedure: REMOVAL FOREIGN BODY ESOPHAGEAL;  Surgeon: [name], MD;  Location: [facility];  Service: ENT;  Laterality: N/A;  . INSERT / REPLACE / REMOVE PACEMAKER    . MASTECTOMY Right 1970   Cancer  . SAVORY DILATION N/A 10/03/2012   Procedure: SAVORY DILATION;  Surgeon: [name], MD;  Location: [facility] ENDOSCOPY;  Service: Endoscopy;  Laterality: N/A;    Allergies  Allergen Reactions  . Augmentin [Amoxicillin-Pot Clavulanate] Nausea And Vomiting  . Lactose Intolerance (Gi)     Outpatient Encounter Medications as of 02/18/2017  Medication Sig  . acetaminophen (TYLENOL) 650 MG CR tablet Take 650 mg by mouth 2 (two) times daily. For pain  . carboxymethylcellulose (REFRESH PLUS) 0.5 % SOLN 2 drops 2 (two) times daily.   . Dentifrices (BIOTENE DRY MOUTH CARE DT) Place 1 spray onto teeth 4 (four) times daily.  Marland Kitchen dextromethorphan-guaiFENesin (MUCINEX DM)  30-600 MG 12hr tablet Take 1 tablet by mouth 2 (two) times daily as needed.   . Docosanol (ABREVA) 10 % CREA Apply 1 application topically 5 (five) times daily. To affected area until healed and then once daily as needed  . escitalopram (LEXAPRO) 20 MG tablet Take 20 mg by mouth daily. For depression/ anxiety.  Marland Kitchen GLUCERNA (GLUCERNA) LIQD Take 1 Can by mouth 2 (two) times daily between meals. At breakfast and lunch 1 can divided between b/f and lunch  . ipratropium-albuterol (DUONEB) 0.5-2.5 (3) MG/3ML SOLN Take 3 mLs by nebulization every 6 (six) hours as needed (cough/wheezing).  Marland Kitchen levothyroxine (SYNTHROID, LEVOTHROID) 75 MCG tablet Take 75 mcg by mouth daily before breakfast. For thyroid therapy  . liver oil-zinc oxide (DESITIN) 40 % ointment Apply 1 application topically as needed for irritation.  Marland Kitchen loratadine (CLARITIN) 10 MG tablet Take 10 mg by mouth daily.   Marland Kitchen losartan (COZAAR) 25 MG tablet Take 1 tablet (25 mg total) by mouth daily.  . Melatonin 5 MG TABS Take 5 tablets by mouth at bedtime.  . memantine (NAMENDA) 10 MG tablet Take 10 mg by mouth 2 (two) times daily.  . metoprolol tartrate (LOPRESSOR) 25 MG tablet Take 25 mg by mouth 2 (two) times daily.  . Multiple Vitamin (MULTIVITAMIN)  tablet Take 1 tablet by mouth daily.  . Multiple Vitamins-Minerals (PRESERVISION AREDS PO) Take by mouth daily.  Marland Kitchen omeprazole (PRILOSEC) 20 MG capsule Take 20 mg by mouth daily.  Marland Kitchen trolamine salicylate (ASPERCREME) 10 % cream Apply 1 application topically 2 (two) times daily as needed for muscle pain.  Addison Lank Hazel 14 % LIQD Apply topically as needed (for hemorrhoid flare up).   No facility-administered encounter medications on file as of 02/18/2017.       Immunization History  Administered Date(s) Administered  . Influenza Inj Mdck Quad Pf 12/22/2015  . Influenza Whole 12/17/2012  . Influenza-Unspecified 12/08/2013, 12/16/2014, 12/27/2016  . PPD Test 07/18/2012  . Pneumococcal Conjugate-13  08/27/2015   Pertinent  Health Maintenance Due  Topic Date Due  . PNA vac Low Risk Adult (2 of 2 - PPSV23) 08/26/2016  . OPHTHALMOLOGY EXAM  10/25/2016  . FOOT EXAM  01/02/2017  . HEMOGLOBIN A1C  05/28/2017  . INFLUENZA VACCINE  Completed   Fall Risk  06/27/2016 11/07/2015 01/24/2015  Falls in the past year? No No No  Risk for fall due to : - History of fall(s) -   Functional Status Survey:    There were no vitals filed for this visit. There is no height or weight on file to calculate BMI.  Wt Readings from Last 3 Encounters:  02/18/17 184 lb 6.4 oz (83.6 kg)  01/21/17 184 lb 9.6 oz (83.7 kg)  01/01/17 183 lb 6.4 oz (83.2 kg)    Review of Systems  Constitutional: Negative for chills, diaphoresis, fever, malaise/fatigue and weight loss.  HENT: Positive for congestion (watery post nasal drip) and hearing loss. Negative for ear discharge, ear pain, nosebleeds, sinus pain, sore throat and tinnitus.   Respiratory: Negative for cough, hemoptysis, sputum production, shortness of breath, wheezing and stridor.   Cardiovascular: Positive for leg swelling. Negative for chest pain, palpitations, orthopnea, claudication and PND.  Genitourinary: Negative for dysuria and flank pain.  Musculoskeletal: Negative for falls and joint pain.  Neurological: Positive for focal weakness (right side due to previou cva). Negative for weakness.  Psychiatric/Behavioral: Positive for memory loss. Negative for depression, hallucinations, substance abuse and suicidal ideas. The patient is not nervous/anxious and does not have insomnia.    Physical Exam  Constitutional: She is oriented to person, place, and time. No distress.  HENT:  Head: Normocephalic and atraumatic.  Nose: Nose normal.  Mouth/Throat: Oropharynx is clear and moist. No oropharyngeal exudate.  Eyes: Conjunctivae are normal. Pupils are equal, round, and reactive to light.  Neck: No JVD present.  Cardiovascular: Normal rate and regular rhythm.    No murmur heard. BLE edema +1. RUE with +2 edema to upper arm, no redness or pain.   Pulmonary/Chest: Effort normal and breath sounds normal. No respiratory distress. She has no wheezes.  Abdominal: Soft. Bowel sounds are normal.  Lymphadenopathy:    She has no cervical adenopathy.  Neurological: She is alert and oriented to person, place, and time.  Right sided weakness  Skin: Skin is warm and dry. She is not diaphoretic.  Psychiatric: She has a normal mood and affect.  Nursing note and vitals reviewed.   Labs reviewed: Recent Labs    03/26/16 08/23/16 08/29/16  NA 142 141 140  K 4.6 4.6 4.2  BUN 19 17 15   CREATININE 0.6 0.6 0.6   Recent Labs    09/10/16  AST 42*  ALT 31  ALKPHOS 48   Recent Labs    03/26/16 09/25/16  0300  WBC 8.5 6.8  HGB 12.2 13.7  HCT 36 39  PLT 128* 130*   Lab Results  Component Value Date   TSH 1.48 08/29/2016   Lab Results  Component Value Date   HGBA1C 7.6 11/28/2016   Lab Results  Component Value Date   CHOL 135 09/19/2015   HDL 50 09/19/2015   LDLCALC 55 09/19/2015   TRIG 149 09/19/2015   CHOLHDL 4.1 07/01/2012    Significant Diagnostic Results in last 30 days:  No results found.  Assessment/Plan  HTN (hypertension) Controlled, continue current medication  Esophageal dysmotility Continue modified diet and asp prec. She is at risk for food impaction and aspiration due to diet non compliance and has been educated regarding this issue.   Allergic rhinitis D/C claritin because she does not feel it is working Facilities manager 180 mg qd  Insomnia Continue melatonin 5 mg qhs  Lymphedema of arm Resident request to wear compression sleeve each evening  Weight gain Weight continues to rise due to diet choices. She has been educated on making better choices but given her age of 28 and poor quality of life we will not push this issue.  Vascular dementia without behavioral disturbance She is continues to be able to operate a  motorized scooter, communicate, feed herself, etc. She still benefits from [place] it will be continued.    Family/ staff Communication: discussed with resident Labs/tests ordered:  NA

## 2017-02-18 NOTE — Assessment & Plan Note (Signed)
D/C claritin because she does not feel it is working Facilities manager 180 mg qd

## 2017-03-12 ENCOUNTER — Other Ambulatory Visit: Payer: Self-pay | Admitting: Internal Medicine

## 2017-03-12 DIAGNOSIS — F419 Anxiety disorder, unspecified: Secondary | ICD-10-CM

## 2017-03-12 MED ORDER — LORAZEPAM 0.5 MG PO TABS: 0.5000 mg | ORAL_TABLET | Freq: Every evening | ORAL | 1 refills | Status: DC | PRN

## 2017-03-28 DIAGNOSIS — E1159 Type 2 diabetes mellitus with other circulatory complications: Secondary | ICD-10-CM | POA: Diagnosis not present

## 2017-03-28 DIAGNOSIS — L84 Corns and callosities: Secondary | ICD-10-CM | POA: Diagnosis not present

## 2017-03-28 DIAGNOSIS — L602 Onychogryphosis: Secondary | ICD-10-CM | POA: Diagnosis not present

## 2017-05-03 ENCOUNTER — Non-Acute Institutional Stay (SKILLED_NURSING_FACILITY): Payer: Medicare Other | Admitting: Adult Health

## 2017-05-03 ENCOUNTER — Encounter: Payer: Self-pay | Admitting: Adult Health

## 2017-05-03 DIAGNOSIS — E669 Obesity, unspecified: Secondary | ICD-10-CM

## 2017-05-03 DIAGNOSIS — F0150 Vascular dementia without behavioral disturbance: Secondary | ICD-10-CM | POA: Diagnosis not present

## 2017-05-03 DIAGNOSIS — I872 Venous insufficiency (chronic) (peripheral): Secondary | ICD-10-CM

## 2017-05-03 DIAGNOSIS — E118 Type 2 diabetes mellitus with unspecified complications: Secondary | ICD-10-CM

## 2017-05-03 DIAGNOSIS — I10 Essential (primary) hypertension: Secondary | ICD-10-CM | POA: Diagnosis not present

## 2017-05-03 DIAGNOSIS — I69351 Hemiplegia and hemiparesis following cerebral infarction affecting right dominant side: Secondary | ICD-10-CM

## 2017-05-03 DIAGNOSIS — J3089 Other allergic rhinitis: Secondary | ICD-10-CM | POA: Diagnosis not present

## 2017-05-03 NOTE — Assessment & Plan Note (Signed)
Continue Allegra 180 mg qd

## 2017-05-03 NOTE — Assessment & Plan Note (Signed)
Controlled. Continue losartan and lopressor.

## 2017-05-03 NOTE — Assessment & Plan Note (Signed)
Continue compression hose on in the am and off in the pm

## 2017-05-03 NOTE — Assessment & Plan Note (Signed)
Progressive weight gain over time due to diet choices and immobility. Diet counseling has been provided to the family/resident

## 2017-05-03 NOTE — Assessment & Plan Note (Signed)
No new events, off plavix. Goals of care are comfort based.

## 2017-05-03 NOTE — Assessment & Plan Note (Signed)
Check A1C Goal <8% Non compliance with diet due to poor quality of life she has chosen to make concession to her diet. Need to ensure annual diabetic eye exam. The last MD visit note is not in the chart. I will have the nurse verify that she has been for her check up and obtain a copy of the note.

## 2017-05-03 NOTE — Progress Notes (Signed)
Location:  [facility]upational psychologist of Service:  SNF (31) Provider:   Cindi Carbon, ANP [place] [phone]   [name], DO  Patient Care Team: [name], DO as PCP - General (Geriatric Medicine) [name], Altamese Dilling, MD as Attending Physician (Gastroenterology) Garvin Fila, MD as Consulting Physician (Neurology)  Extended Emergency Contact Information Primary Emergency Contact: [place] Address: [address]          [place], [place] [address] [name] of [place] Phone: [phone] Mobile Phone: [phone] Relation: Daughter  Code Status:  DNR Goals of care: Advanced Directive information Advanced Directives 01/01/2017  Does Patient Have a Medical Advance Directive? Yes  Type of Advance Directive Out of facility DNR (pink MOST or yellow form);[place];Living will  Does patient want to make changes to medical advance directive? -  Copy of [place] in Chart? Yes  Pre-existing out of facility DNR order (yellow form or pink MOST form) Yellow form placed in chart (order not valid for inpatient use)     Chief Complaint  Patient presents with  . Medical Management of Chronic Issues    HPI:  Pt is a 84 y.o. female seen today for medical management of chronic diseases. She resides in skilled care.   Hx of CVA with right sided weakness: has right arm and leg edema.  Off plavix due to rectal bleeding  DM II: needs diabetic eye exam Lab Results  Component Value Date   HGBA1C 7.6 11/28/2016   HTN: controlled  Allergic rhinitis: has occasionally drippy nose and water eyes Takes allegra  Obesity: Her weight has been stable for the month, has gained 5 lbs in the past 6 months She tends to eat snacks. However, due to a hx of esophageal impaction she is on a baby food diet per GI but has a waiver to eat other items that she wishes. Wt  Readings from Last 3 Encounters:  05/03/17 185 lb 3.2 oz (84 kg)  02/18/17 184 lb 6.4 oz (83.6 kg)  01/21/17 184 lb 9.6 oz (83.7 kg)    Vascular dementia: MMSE 25/30 on 03/13/17 MMSE scores have been stable over the passed year but the nurse reports worsening in short term memory loss  2D echo 09/11/16: EF 65-70% Mild aortic insuff Mild mitral insuff Mild TR with mild pulmonary htn Past Medical History:  Diagnosis Date  . Abnormality of gait 2007  . Acquired cyst of kidney 2002  . Acute sinus infection 11/25/2012  . Allergic rhinitis due to pollen   . Altered mental status 11/28/2012  . Anxiety state, unspecified   . Atrioventricular block, complete ([place]) 2003   s/p PPM 2003, generator chnage 2011  . Cardiac pacemaker in situ 2003  . Coronary atherosclerosis of native coronary artery 2003   non obstructive by Cath 2003  . CVA (cerebral infarction) 06/28/2012   rt hemiparesis, dysphagia  . Dendritic keratitis 2012  . Dysuria   . Edema 2003  . Esophageal dysmotility 08/19/2012  . Food impaction of esophagus 08/15/2012  . HTN (hypertension) 06/28/2012  . Hypertension   . Macular degeneration (senile) of retina, unspecified 2012  . Malignant neoplasm of breast (female), unspecified site 1970   S/P Rt radical mastectomy  . NSTEMI (non-ST elevated myocardial infarction) ([place]) 06/28/2012  . Osteoarthrosis, unspecified whether generalized or localized, unspecified site 2013  . Pacemaker   . Personal history of colonic polyps  s/p colonoscopy/polypectomy 2003  . Pure hyperglyceridemia 2004  . Senile osteoporosis 2003  . Stroke ([facility]) 06/28/2012  . Tinnitus 2007  . Type II or unspecified type diabetes mellitus without mention of complication, not stated as uncontrolled 2003  . Unspecified arthropathy, pelvic region and thigh 2010  . Unspecified glaucoma(365.9) 2013  . Unspecified hypothyroidism 2009  . Unspecified vitamin D deficiency 2009  . Urinary retention 07/07/2012   Past Surgical  History:  Procedure Laterality Date  . BOTOX INJECTION N/A 10/03/2012   Procedure: BOTOX INJECTION;  Surgeon: [name], MD;  Location: [facility] ENDOSCOPY;  Service: Endoscopy;  Laterality: N/A;  . ESOPHAGOGASTRODUODENOSCOPY N/A 08/15/2012   Procedure: ESOPHAGOGASTRODUODENOSCOPY (EGD);  Surgeon: [name], MD;  Location: [facility] ENDOSCOPY;  Service: Endoscopy;  Laterality: N/A;  . ESOPHAGOGASTRODUODENOSCOPY N/A 08/15/2012   Procedure: ESOPHAGOGASTRODUODENOSCOPY (EGD);  Surgeon: [name], MD;  Location: [facility];  Service: Endoscopy;  Laterality: N/A;  . ESOPHAGOGASTRODUODENOSCOPY N/A 10/03/2012   Procedure: ESOPHAGOGASTRODUODENOSCOPY (EGD);  Surgeon: [name], MD;  Location: [facility];  Service: Endoscopy;  Laterality: N/A;  . ESOPHAGOGASTRODUODENOSCOPY N/A 12/22/2012   Procedure: ESOPHAGOGASTRODUODENOSCOPY (EGD);  Surgeon: [name], MD;  Location: [facility] ENDOSCOPY;  Service: Endoscopy;  Laterality: N/A;  . ESOPHAGOSCOPY N/A 08/15/2012   Procedure: ESOPHAGOSCOPY;  Surgeon: [name], MD;  Location: [facility];  Service: ENT;  Laterality: N/A;  . FOREIGN BODY REMOVAL ESOPHAGEAL N/A 08/15/2012   Procedure: REMOVAL FOREIGN BODY ESOPHAGEAL;  Surgeon: [name], MD;  Location: [facility];  Service: ENT;  Laterality: N/A;  . INSERT / REPLACE / REMOVE PACEMAKER    . MASTECTOMY Right 1970   Cancer  . SAVORY DILATION N/A 10/03/2012   Procedure: SAVORY DILATION;  Surgeon: [name], MD;  Location: [facility] ENDOSCOPY;  Service: Endoscopy;  Laterality: N/A;    Allergies  Allergen Reactions  . Augmentin [Amoxicillin-Pot Clavulanate] Nausea And Vomiting  . Lactose Intolerance (Gi)     Outpatient Encounter Medications as of 05/03/2017  Medication Sig  . acetaminophen (TYLENOL) 650 MG CR tablet Take 650 mg by mouth 2 (two) times daily. For pain  . carboxymethylcellulose (REFRESH PLUS) 0.5 % SOLN 2 drops 2 (two) times daily.   . Dentifrices (BIOTENE DRY MOUTH CARE DT) Place 1 spray onto teeth 4 (four) times daily.  Marland Kitchen  dextromethorphan-guaiFENesin (MUCINEX DM) 30-600 MG 12hr tablet Take 1 tablet by mouth 2 (two) times daily as needed.   . Docosanol (ABREVA) 10 % CREA Apply 1 application topically 5 (five) times daily. To affected area until healed and then once daily as needed  . escitalopram (LEXAPRO) 20 MG tablet Take 20 mg by mouth daily. For depression/ anxiety.  . fexofenadine (ALLEGRA) 180 MG tablet Take 180 mg by mouth daily.  Marland Kitchen GLUCERNA (GLUCERNA) LIQD Take 1 Can by mouth 2 (two) times daily between meals. At breakfast and lunch 1 can divided between b/f and lunch  . ipratropium-albuterol (DUONEB) 0.5-2.5 (3) MG/3ML SOLN Take 3 mLs by nebulization every 6 (six) hours as needed (cough/wheezing).  Marland Kitchen levothyroxine (SYNTHROID, LEVOTHROID) 75 MCG tablet Take 75 mcg by mouth daily before breakfast. For thyroid therapy  . liver oil-zinc oxide (DESITIN) 40 % ointment Apply 1 application topically as needed for irritation.  Marland Kitchen LORazepam (ATIVAN) 0.5 MG tablet Take 1 tablet (0.5 mg total) by mouth at bedtime as needed for anxiety. (Patient taking differently: Take 0.25 mg by mouth at bedtime as needed for anxiety. )  . losartan (COZAAR) 25 MG tablet Take  1 tablet (25 mg total) by mouth daily.  . Melatonin 5 MG TABS Take 5 tablets by mouth at bedtime.  . memantine (NAMENDA) 10 MG tablet Take 10 mg by mouth 2 (two) times daily.  . metoprolol tartrate (LOPRESSOR) 25 MG tablet Take 25 mg by mouth 2 (two) times daily.  . Multiple Vitamin (MULTIVITAMIN) tablet Take 1 tablet by mouth daily.  . Multiple Vitamins-Minerals (PRESERVISION AREDS PO) Take 1 tablet by mouth 2 (two) times daily.   Marland Kitchen omeprazole (PRILOSEC) 20 MG capsule Take 20 mg by mouth daily.  Marland Kitchen trolamine salicylate (ASPERCREME) 10 % cream Apply 1 application topically 2 (two) times daily as needed for muscle pain.  Addison Lank Hazel 14 % LIQD Apply topically as needed (for hemorrhoid flare up).  . [DISCONTINUED] loratadine (CLARITIN) 10 MG tablet Take 10 mg by mouth  daily.    No facility-administered encounter medications on file as of 05/03/2017.     Review of Systems  Constitutional: Negative for activity change, appetite change, chills, diaphoresis, fatigue, fever and unexpected weight change.  HENT: Positive for rhinorrhea and trouble swallowing. Negative for congestion, sinus pressure and sore throat.   Eyes: Negative for visual disturbance.  Respiratory: Negative for cough, shortness of breath and wheezing.   Cardiovascular: Positive for leg swelling. Negative for chest pain and palpitations.  Gastrointestinal: Negative for abdominal distention, abdominal pain, constipation and diarrhea.  Genitourinary: Negative for difficulty urinating and dysuria.  Musculoskeletal: Positive for gait problem. Negative for arthralgias, back pain, joint swelling and myalgias.  Neurological: Positive for weakness. Negative for dizziness, tremors, seizures, syncope, facial asymmetry, speech difficulty, light-headedness, numbness and headaches.  Psychiatric/Behavioral: Positive for confusion. Negative for agitation and behavioral problems.    Immunization History  Administered Date(s) Administered  . Influenza Inj Mdck Quad Pf 12/22/2015  . Influenza Whole 12/17/2012  . Influenza-Unspecified 12/08/2013, 12/16/2014, 12/27/2016  . PPD Test 07/18/2012  . Pneumococcal Conjugate-13 08/27/2015   Pertinent  Health Maintenance Due  Topic Date Due  . OPHTHALMOLOGY EXAM  10/25/2016  . FOOT EXAM  01/02/2017  . PNA vac Low Risk Adult (2 of 2 - PPSV23) 02/18/2018 (Originally 08/26/2016)  . HEMOGLOBIN A1C  05/28/2017  . INFLUENZA VACCINE  Completed   Fall Risk  06/27/2016 11/07/2015 01/24/2015  Falls in the past year? No No No  Risk for fall due to : - History of fall(s) -   Functional Status Survey:    Vitals:   05/03/17 1118  BP: 134/68  SpO2: 91%  Weight: 185 lb 3.2 oz (84 kg)   Body mass index is 34.99 kg/m. Physical Exam  Constitutional: She is oriented to  person, place, and time. No distress.  HENT:  Head: Normocephalic and atraumatic.  Mouth/Throat: No oropharyngeal exudate.  Neck: No JVD present.  Cardiovascular: Normal rate and regular rhythm.  No murmur heard. Edema to BLE R>L due to previous CVA +1  Pulmonary/Chest: Effort normal and breath sounds normal. No respiratory distress. She has no wheezes.  Abdominal: Soft. Bowel sounds are normal.  Musculoskeletal: She exhibits edema (right arm). She exhibits no tenderness.  Lymphadenopathy:    She has no cervical adenopathy.  Neurological: She is alert and oriented to person, place, and time.  Short term memory loss  Skin: Skin is warm and dry. She is not diaphoretic.  Psychiatric: She has a normal mood and affect.    Labs reviewed: Recent Labs    08/23/16 08/29/16  NA 141 140  K 4.6 4.2  BUN 17 15  CREATININE 0.6 0.6   Recent Labs    09/10/16  AST 42*  ALT 31  ALKPHOS 48   Recent Labs    09/25/16 0300  WBC 6.8  HGB 13.7  HCT 39  PLT 130*   Lab Results  Component Value Date   TSH 1.48 08/29/2016   Lab Results  Component Value Date   HGBA1C 7.6 11/28/2016   Lab Results  Component Value Date   CHOL 135 09/19/2015   HDL 50 09/19/2015   LDLCALC 55 09/19/2015   TRIG 149 09/19/2015   CHOLHDL 4.1 07/01/2012    Significant Diagnostic Results in last 30 days:  No results found.  Assessment/Plan  Vascular dementia without behavioral disturbance Worsening short term memory loss Continue Namenda  Diabetes mellitus type 2, controlled, with complications Check T1Z Goal <8% Non compliance with diet due to poor quality of life she has chosen to make concession to her diet. Need to ensure annual diabetic eye exam. The last MD visit note is not in the chart. I will have the nurse verify that she has been for her check up and obtain a copy of the note.   Obesity (BMI 30.0-34.9) Progressive weight gain over time due to diet choices and immobility. Diet counseling  has been provided to the family/resident  Hemiparesis affecting right side as late effect of cerebrovascular accident No new events, off plavix. Goals of care are comfort based.   Allergic rhinitis Continue Allegra 180 mg qd  HTN (hypertension) Controlled. Continue losartan and lopressor.  Chronic venous insufficiency Continue compression hose on in the am and off in the pm   Family/ staff Communication: discussed with staff  Labs/tests ordered:  CMP and A1C

## 2017-05-03 NOTE — Assessment & Plan Note (Signed)
Worsening short term memory loss Continue Namenda

## 2017-05-06 DIAGNOSIS — E119 Type 2 diabetes mellitus without complications: Secondary | ICD-10-CM | POA: Diagnosis not present

## 2017-05-06 DIAGNOSIS — E118 Type 2 diabetes mellitus with unspecified complications: Secondary | ICD-10-CM | POA: Diagnosis not present

## 2017-05-06 LAB — HEPATIC FUNCTION PANEL
ALT: 20 (ref 7–35)
AST: 33 (ref 13–35)
Alkaline Phosphatase: 52 (ref 25–125)
Bilirubin, Total: 0.4

## 2017-05-06 LAB — HEMOGLOBIN A1C: Hemoglobin A1C: 8.3

## 2017-05-06 LAB — BASIC METABOLIC PANEL
BUN: 18 (ref 4–21)
Creatinine: 0.5 (ref 0.5–1.1)
Glucose: 176
Potassium: 4.7 (ref 3.4–5.3)
Sodium: 140 (ref 137–147)

## 2017-05-08 ENCOUNTER — Ambulatory Visit (INDEPENDENT_AMBULATORY_CARE_PROVIDER_SITE_OTHER): Payer: Medicare Other | Admitting: *Deleted

## 2017-05-08 DIAGNOSIS — I442 Atrioventricular block, complete: Secondary | ICD-10-CM | POA: Diagnosis not present

## 2017-05-09 ENCOUNTER — Encounter: Payer: Self-pay | Admitting: Cardiology

## 2017-05-09 NOTE — Progress Notes (Signed)
Remote pacemaker transmission.   

## 2017-05-13 LAB — CUP PACEART REMOTE DEVICE CHECK
Battery Impedance: 1040 Ohm
Battery Remaining Longevity: 53 mo
Battery Voltage: 2.77 V
Brady Statistic AP VP Percent: 0 %
Brady Statistic AS VP Percent: 99 %
Implantable Lead Implant Date: 20110506
Implantable Lead Model: 5076
Implantable Lead Model: 5076
Implantable Pulse Generator Implant Date: 20110506
Lead Channel Impedance Value: 495 Ohm
Lead Channel Pacing Threshold Amplitude: 0.75 V
Lead Channel Pacing Threshold Amplitude: 1.125 V
Lead Channel Pacing Threshold Pulse Width: 0.4 ms
Lead Channel Setting Pacing Amplitude: 2 V
Lead Channel Setting Pacing Pulse Width: 0.4 ms
Lead Channel Setting Sensing Sensitivity: 2.8 mV
MDC IDC LEAD IMPLANT DT: 20110506
MDC IDC LEAD LOCATION: 753859
MDC IDC LEAD LOCATION: 753860
MDC IDC MSMT LEADCHNL RV IMPEDANCE VALUE: 430 Ohm
MDC IDC MSMT LEADCHNL RV PACING THRESHOLD PULSEWIDTH: 0.4 ms
MDC IDC SESS DTM: 20190306154441
MDC IDC SET LEADCHNL RV PACING AMPLITUDE: 2.5 V
MDC IDC STAT BRADY AP VS PERCENT: 0 %
MDC IDC STAT BRADY AS VS PERCENT: 0 %

## 2017-05-28 DIAGNOSIS — R293 Abnormal posture: Secondary | ICD-10-CM | POA: Diagnosis not present

## 2017-05-28 DIAGNOSIS — R278 Other lack of coordination: Secondary | ICD-10-CM | POA: Diagnosis not present

## 2017-05-28 DIAGNOSIS — I69351 Hemiplegia and hemiparesis following cerebral infarction affecting right dominant side: Secondary | ICD-10-CM | POA: Diagnosis not present

## 2017-05-28 DIAGNOSIS — M6389 Disorders of muscle in diseases classified elsewhere, multiple sites: Secondary | ICD-10-CM | POA: Diagnosis not present

## 2017-05-29 DIAGNOSIS — I69351 Hemiplegia and hemiparesis following cerebral infarction affecting right dominant side: Secondary | ICD-10-CM | POA: Diagnosis not present

## 2017-05-29 DIAGNOSIS — R278 Other lack of coordination: Secondary | ICD-10-CM | POA: Diagnosis not present

## 2017-05-29 DIAGNOSIS — M6389 Disorders of muscle in diseases classified elsewhere, multiple sites: Secondary | ICD-10-CM | POA: Diagnosis not present

## 2017-05-29 DIAGNOSIS — R293 Abnormal posture: Secondary | ICD-10-CM | POA: Diagnosis not present

## 2017-05-30 DIAGNOSIS — I69351 Hemiplegia and hemiparesis following cerebral infarction affecting right dominant side: Secondary | ICD-10-CM | POA: Diagnosis not present

## 2017-05-30 DIAGNOSIS — M6389 Disorders of muscle in diseases classified elsewhere, multiple sites: Secondary | ICD-10-CM | POA: Diagnosis not present

## 2017-05-30 DIAGNOSIS — R293 Abnormal posture: Secondary | ICD-10-CM | POA: Diagnosis not present

## 2017-05-30 DIAGNOSIS — R278 Other lack of coordination: Secondary | ICD-10-CM | POA: Diagnosis not present

## 2017-05-31 DIAGNOSIS — R293 Abnormal posture: Secondary | ICD-10-CM | POA: Diagnosis not present

## 2017-05-31 DIAGNOSIS — M6389 Disorders of muscle in diseases classified elsewhere, multiple sites: Secondary | ICD-10-CM | POA: Diagnosis not present

## 2017-05-31 DIAGNOSIS — R278 Other lack of coordination: Secondary | ICD-10-CM | POA: Diagnosis not present

## 2017-05-31 DIAGNOSIS — I69351 Hemiplegia and hemiparesis following cerebral infarction affecting right dominant side: Secondary | ICD-10-CM | POA: Diagnosis not present

## 2017-06-03 DIAGNOSIS — M6389 Disorders of muscle in diseases classified elsewhere, multiple sites: Secondary | ICD-10-CM | POA: Diagnosis not present

## 2017-06-03 DIAGNOSIS — I69351 Hemiplegia and hemiparesis following cerebral infarction affecting right dominant side: Secondary | ICD-10-CM | POA: Diagnosis not present

## 2017-06-03 DIAGNOSIS — R293 Abnormal posture: Secondary | ICD-10-CM | POA: Diagnosis not present

## 2017-06-03 DIAGNOSIS — R278 Other lack of coordination: Secondary | ICD-10-CM | POA: Diagnosis not present

## 2017-06-04 DIAGNOSIS — E1159 Type 2 diabetes mellitus with other circulatory complications: Secondary | ICD-10-CM | POA: Diagnosis not present

## 2017-06-04 DIAGNOSIS — I69351 Hemiplegia and hemiparesis following cerebral infarction affecting right dominant side: Secondary | ICD-10-CM | POA: Diagnosis not present

## 2017-06-04 DIAGNOSIS — R293 Abnormal posture: Secondary | ICD-10-CM | POA: Diagnosis not present

## 2017-06-04 DIAGNOSIS — R278 Other lack of coordination: Secondary | ICD-10-CM | POA: Diagnosis not present

## 2017-06-04 DIAGNOSIS — L84 Corns and callosities: Secondary | ICD-10-CM | POA: Diagnosis not present

## 2017-06-04 DIAGNOSIS — L602 Onychogryphosis: Secondary | ICD-10-CM | POA: Diagnosis not present

## 2017-06-04 DIAGNOSIS — M6389 Disorders of muscle in diseases classified elsewhere, multiple sites: Secondary | ICD-10-CM | POA: Diagnosis not present

## 2017-06-05 DIAGNOSIS — R293 Abnormal posture: Secondary | ICD-10-CM | POA: Diagnosis not present

## 2017-06-05 DIAGNOSIS — I69351 Hemiplegia and hemiparesis following cerebral infarction affecting right dominant side: Secondary | ICD-10-CM | POA: Diagnosis not present

## 2017-06-05 DIAGNOSIS — M6389 Disorders of muscle in diseases classified elsewhere, multiple sites: Secondary | ICD-10-CM | POA: Diagnosis not present

## 2017-06-05 DIAGNOSIS — R278 Other lack of coordination: Secondary | ICD-10-CM | POA: Diagnosis not present

## 2017-06-06 DIAGNOSIS — I69351 Hemiplegia and hemiparesis following cerebral infarction affecting right dominant side: Secondary | ICD-10-CM | POA: Diagnosis not present

## 2017-06-06 DIAGNOSIS — R278 Other lack of coordination: Secondary | ICD-10-CM | POA: Diagnosis not present

## 2017-06-06 DIAGNOSIS — R293 Abnormal posture: Secondary | ICD-10-CM | POA: Diagnosis not present

## 2017-06-06 DIAGNOSIS — M6389 Disorders of muscle in diseases classified elsewhere, multiple sites: Secondary | ICD-10-CM | POA: Diagnosis not present

## 2017-06-10 DIAGNOSIS — R293 Abnormal posture: Secondary | ICD-10-CM | POA: Diagnosis not present

## 2017-06-10 DIAGNOSIS — I69351 Hemiplegia and hemiparesis following cerebral infarction affecting right dominant side: Secondary | ICD-10-CM | POA: Diagnosis not present

## 2017-06-10 DIAGNOSIS — M6389 Disorders of muscle in diseases classified elsewhere, multiple sites: Secondary | ICD-10-CM | POA: Diagnosis not present

## 2017-06-10 DIAGNOSIS — R278 Other lack of coordination: Secondary | ICD-10-CM | POA: Diagnosis not present

## 2017-06-11 DIAGNOSIS — I69351 Hemiplegia and hemiparesis following cerebral infarction affecting right dominant side: Secondary | ICD-10-CM | POA: Diagnosis not present

## 2017-06-11 DIAGNOSIS — R278 Other lack of coordination: Secondary | ICD-10-CM | POA: Diagnosis not present

## 2017-06-11 DIAGNOSIS — M6389 Disorders of muscle in diseases classified elsewhere, multiple sites: Secondary | ICD-10-CM | POA: Diagnosis not present

## 2017-06-11 DIAGNOSIS — R293 Abnormal posture: Secondary | ICD-10-CM | POA: Diagnosis not present

## 2017-06-12 DIAGNOSIS — R278 Other lack of coordination: Secondary | ICD-10-CM | POA: Diagnosis not present

## 2017-06-12 DIAGNOSIS — R293 Abnormal posture: Secondary | ICD-10-CM | POA: Diagnosis not present

## 2017-06-12 DIAGNOSIS — M6389 Disorders of muscle in diseases classified elsewhere, multiple sites: Secondary | ICD-10-CM | POA: Diagnosis not present

## 2017-06-12 DIAGNOSIS — I69351 Hemiplegia and hemiparesis following cerebral infarction affecting right dominant side: Secondary | ICD-10-CM | POA: Diagnosis not present

## 2017-06-13 DIAGNOSIS — I69351 Hemiplegia and hemiparesis following cerebral infarction affecting right dominant side: Secondary | ICD-10-CM | POA: Diagnosis not present

## 2017-06-13 DIAGNOSIS — M6389 Disorders of muscle in diseases classified elsewhere, multiple sites: Secondary | ICD-10-CM | POA: Diagnosis not present

## 2017-06-13 DIAGNOSIS — R293 Abnormal posture: Secondary | ICD-10-CM | POA: Diagnosis not present

## 2017-06-13 DIAGNOSIS — R278 Other lack of coordination: Secondary | ICD-10-CM | POA: Diagnosis not present

## 2017-06-18 DIAGNOSIS — I69351 Hemiplegia and hemiparesis following cerebral infarction affecting right dominant side: Secondary | ICD-10-CM | POA: Diagnosis not present

## 2017-06-18 DIAGNOSIS — R293 Abnormal posture: Secondary | ICD-10-CM | POA: Diagnosis not present

## 2017-06-18 DIAGNOSIS — M6389 Disorders of muscle in diseases classified elsewhere, multiple sites: Secondary | ICD-10-CM | POA: Diagnosis not present

## 2017-06-18 DIAGNOSIS — R278 Other lack of coordination: Secondary | ICD-10-CM | POA: Diagnosis not present

## 2017-06-20 DIAGNOSIS — I69351 Hemiplegia and hemiparesis following cerebral infarction affecting right dominant side: Secondary | ICD-10-CM | POA: Diagnosis not present

## 2017-06-20 DIAGNOSIS — R293 Abnormal posture: Secondary | ICD-10-CM | POA: Diagnosis not present

## 2017-06-20 DIAGNOSIS — M6389 Disorders of muscle in diseases classified elsewhere, multiple sites: Secondary | ICD-10-CM | POA: Diagnosis not present

## 2017-06-20 DIAGNOSIS — R278 Other lack of coordination: Secondary | ICD-10-CM | POA: Diagnosis not present

## 2017-06-25 ENCOUNTER — Encounter: Payer: Self-pay | Admitting: Internal Medicine

## 2017-06-25 ENCOUNTER — Non-Acute Institutional Stay (SKILLED_NURSING_FACILITY): Payer: Medicare Other | Admitting: Internal Medicine

## 2017-06-25 DIAGNOSIS — E785 Hyperlipidemia, unspecified: Secondary | ICD-10-CM

## 2017-06-25 DIAGNOSIS — I442 Atrioventricular block, complete: Secondary | ICD-10-CM | POA: Diagnosis not present

## 2017-06-25 DIAGNOSIS — F339 Major depressive disorder, recurrent, unspecified: Secondary | ICD-10-CM | POA: Diagnosis not present

## 2017-06-25 DIAGNOSIS — I69351 Hemiplegia and hemiparesis following cerebral infarction affecting right dominant side: Secondary | ICD-10-CM | POA: Diagnosis not present

## 2017-06-25 DIAGNOSIS — F5101 Primary insomnia: Secondary | ICD-10-CM | POA: Diagnosis not present

## 2017-06-25 DIAGNOSIS — E039 Hypothyroidism, unspecified: Secondary | ICD-10-CM | POA: Diagnosis not present

## 2017-06-25 DIAGNOSIS — E118 Type 2 diabetes mellitus with unspecified complications: Secondary | ICD-10-CM

## 2017-06-25 DIAGNOSIS — F411 Generalized anxiety disorder: Secondary | ICD-10-CM | POA: Diagnosis not present

## 2017-06-25 DIAGNOSIS — Z0000 Encounter for general adult medical examination without abnormal findings: Secondary | ICD-10-CM

## 2017-06-25 DIAGNOSIS — E669 Obesity, unspecified: Secondary | ICD-10-CM | POA: Diagnosis not present

## 2017-06-25 DIAGNOSIS — F0150 Vascular dementia without behavioral disturbance: Secondary | ICD-10-CM | POA: Diagnosis not present

## 2017-06-25 DIAGNOSIS — I10 Essential (primary) hypertension: Secondary | ICD-10-CM | POA: Diagnosis not present

## 2017-06-25 DIAGNOSIS — K224 Dyskinesia of esophagus: Secondary | ICD-10-CM

## 2017-06-25 DIAGNOSIS — E66811 Obesity, class 1: Secondary | ICD-10-CM

## 2017-06-25 NOTE — Progress Notes (Signed)
Patient ID: Rachel Cooke, female   DOB: 08-Jan-1920, 84 y.o.   MRN: [id number]  Provider:  Rexene Edison. Mariea Clonts, D.O., C.M.D. Location: [facility] Room Number: [place] of Service:  SNF (31)   PCP: [name], DO Patient Care Team: [name], DO as PCP - General (Geriatric Medicine) [facility] Gwenette Greet, MD as Attending Physician (Gastroenterology) Garvin Fila, MD as Consulting Physician (Neurology)  Extended Emergency Contact Information Primary Emergency Contact: [place] Address: [address]          [place], [place] [address] [name] of [place] Phone: [phone] Mobile Phone: [phone] Relation: Daughter  Code Status: DNR Goals of Care: Advanced Directive information Advanced Directives 06/25/2017  Does Patient Have a Medical Advance Directive? Yes  Type of Paramedic of [place];Living will;Out of facility DNR (pink MOST or yellow form)  Does patient want to make changes to medical advance directive? No - Patient declined  Copy of [place] in Chart? Yes  Pre-existing out of facility DNR order (yellow form or pink MOST form) Yellow form placed in chart (order not valid for inpatient use);Pink MOST form placed in chart (order not valid for inpatient use)   Chief Complaint  Patient presents with  . Annual Exam    CPE    HPI: Patient is a 84 y.o. female seen today for an annual comprehensive examination.  She lives in skilled care due to her prior stroke with right hemiparesis that resulted in physical debility and she has since developed dementia, as well. She is transferred with a hoyer lift.   Met with her nurse. She reports that she typically does not complain to staff, only to her daughter.  Recently, her memory has been declining more notably.  She misplaced her glasses and her hearing aids which were both located in her room.  She is  on namenda for her vascular dementia.  She did see Dr. Claudean Kinds for an eye exam back in 07/09/16 and macular degeneration was noted but no diabetic retinopathy.  Esophageal dysmotility:  She c/o her diet with baby food due to her esophageal dysmotility and h/o impactions.  This is a chronic complaint.  She does have a waiver to eat other snacks and does this.  Not having difficulty maintaining her weight.  On prilosec for gerd and her esophageal motility issues.  H/o CVA:  Ongoing right hemiparesis with associated right arm and leg edema.  She no longer takes plavix after at least 3 episodes of rectal bleeding.    Obesity:  Ongoing.  Weight stable since last month at 135 lbs.  Complete AV block:  Had pacemaker interrogation/transmission on 05/08/17 with RN Long and Dr. Caryl Comes w/o concerns.    HTN:  BP well controlled with current regimen and asymptomatic.  Hyperlipidemia:  No longer on meds at 84 yo with comfort care goals.    Hypothyroidism:   Lab Results  Component Value Date   TSH 1.48 08/29/2016  due in JU[place] for annual tsh.  On levothyroxine 83mg daily before breakfast.  Depression with anxiety and insomnia:  No complaints to [place]  Remains on lexapro with ativan for anxiety, melatonin for sleep.  Reports resting well.  Past Medical History:  Diagnosis Date  . Abnormality of gait 2007  . Acquired cyst of kidney 2002  . Acute sinus infection 11/25/2012  . Allergic rhinitis due to pollen   . Altered mental status 11/28/2012  .  Anxiety state, unspecified   . Atrioventricular block, complete ([place]) 2003   s/p PPM 2003, generator chnage 2011  . Cardiac pacemaker in situ 2003  . Coronary atherosclerosis of native coronary artery 2003   non obstructive by Cath 2003  . CVA (cerebral infarction) 06/28/2012   rt hemiparesis, dysphagia  . Dendritic keratitis 2012  . Dysuria   . Edema 2003  . Esophageal dysmotility 08/19/2012  . Food impaction of esophagus 08/15/2012  . HTN (hypertension)  06/28/2012  . Hypertension   . Macular degeneration (senile) of retina, unspecified 2012  . Malignant neoplasm of breast (female), unspecified site 1970   S/P Rt radical mastectomy  . NSTEMI (non-ST elevated myocardial infarction) ([place]) 06/28/2012  . Osteoarthrosis, unspecified whether generalized or localized, unspecified site 2013  . Pacemaker   . Personal history of colonic polyps     s/p colonoscopy/polypectomy 2003  . Pure hyperglyceridemia 2004  . Senile osteoporosis 2003  . Stroke ([place]) 06/28/2012  . Tinnitus 2007  . Type II or unspecified type diabetes mellitus without mention of complication, not stated as uncontrolled 2003  . Unspecified arthropathy, pelvic region and thigh 2010  . Unspecified glaucoma(365.9) 2013  . Unspecified hypothyroidism 2009  . Unspecified vitamin D deficiency 2009  . Urinary retention 07/07/2012   Past Surgical History:  Procedure Laterality Date  . BOTOX INJECTION N/A 10/03/2012   Procedure: BOTOX INJECTION;  Surgeon: [name], MD;  Location: [facility] ENDOSCOPY;  Service: Endoscopy;  Laterality: N/A;  . ESOPHAGOGASTRODUODENOSCOPY N/A 08/15/2012   Procedure: ESOPHAGOGASTRODUODENOSCOPY (EGD);  Surgeon: [name], MD;  Location[facility] ENDOSCOPY;  Service: Endoscopy;  Laterality: N/A;  . ESOPHAGOGASTRODUODENOSCOPY N/A 08/15/2012   Procedure: ESOPHAGOGASTRODUODENOSCOPY (EGD);  Surgeon: [name], MD;  Location: [facility];  Service: Endoscopy;  Laterality: N/A;  . ESOPHAGOGASTRODUODENOSCOPY N/A 10/03/2012   Procedure: ESOPHAGOGASTRODUODENOSCOPY (EGD);  Surgeon: [name], MD;  Location: [facility] ENDOSCOPY;  Service: Endoscopy;  Laterality: N/A;  . ESOPHAGOGASTRODUODENOSCOPY N/A 12/22/2012   Procedure: ESOPHAGOGASTRODUODENOSCOPY (EGD);  Surgeon: [name], MD;  Location: [facility] ENDOSCOPY;  Service: Endoscopy;  Laterality: N/A;  . ESOPHAGOSCOPY N/A 08/15/2012   Procedure: ESOPHAGOSCOPY;  Surgeon: [name], MD;  Location: [facility];  Service: ENT;  Laterality: N/A;  .  FOREIGN BODY REMOVAL ESOPHAGEAL N/A 08/15/2012   Procedure: REMOVAL FOREIGN BODY ESOPHAGEAL;  Surgeon: [name], MD;  Location: [facility];  Service: ENT;  Laterality: N/A;  . INSERT / REPLACE / REMOVE PACEMAKER    . MASTECTOMY Right 1970   Cancer  . SAVORY DILATION N/A 10/03/2012   Procedure: SAVORY DILATION;  Surgeon: [name], MD;  Location: [facility] ENDOSCOPY;  Service: Endoscopy;  Laterality: N/A;    reports that she has quit smoking. She has a 0.75 pack-year smoking history. She has never used smokeless tobacco. She reports that she does not drink alcohol or use drugs. Social History   Socioeconomic History  . Marital status: Widowed    Spouse name: Not on file  . Number of children: 3  . Years of education: 12th  . Highest education level: Not on file  Occupational History  . Occupation: retired  Scientific laboratory technician  . Financial resource strain: Not on file  . Food insecurity:    Worry: Not on file    Inability: Not on file  . Transportation needs:    Medical: Not on file    Non-medical: Not on file  Tobacco Use  . Smoking status: Former Smoker    Packs/day: 0.25  Years: 3.00    Pack years: 0.75  . Smokeless tobacco: Never Used  Substance and Sexual Activity  . Alcohol use: No  . Drug use: No  . Sexual activity: Never  Lifestyle  . Physical activity:    Days per week: Not on file    Minutes per session: Not on file  . Stress: Not on file  Relationships  . Social connections:    Talks on phone: Not on file    Gets together: Not on file    Attends religious service: Not on file    Active member of club or organization: Not on file    Attends meetings of clubs or organizations: Not on file    Relationship status: Not on file  Other Topics Concern  . Not on file  Social History Narrative  . Not on file   Family History  Problem Relation Age of Onset  . Hypertension Father     Pertinent  Health Maintenance Due  Topic Date Due  . OPHTHALMOLOGY EXAM  10/25/2016  .  FOOT EXAM  01/02/2017  . HEMOGLOBIN A1C  05/28/2017  . PNA vac Low Risk Adult (2 of 2 - PPSV23) 02/18/2018 (Originally 08/26/2016)  . INFLUENZA VACCINE  10/03/2017   Fall Risk  06/27/2016 11/07/2015 01/24/2015  Falls in the past year? No No No  Risk for fall due to : - History of fall(s) -   Depression screen [facility] 2/9 06/27/2016 11/07/2015 01/24/2015  Decreased Interest 0 1 0  Down, Depressed, Hopeless 0 1 0  PHQ - 2 Score 0 2 0  Altered sleeping - 0 -  Tired, decreased energy - 0 -  Change in appetite - 0 -  Feeling bad or failure about yourself  - 1 -  Trouble concentrating - 0 -  Moving slowly or fidgety/restless - 0 -  Suicidal thoughts - 0 -  PHQ-9 Score - 3 -  Difficult doing work/chores - Somewhat difficult -    Functional Status Survey:    Allergies  Allergen Reactions  . Augmentin [Amoxicillin-Pot Clavulanate] Nausea And Vomiting  . Lactose Intolerance (Gi)     Outpatient Encounter Medications as of 06/25/2017  Medication Sig  . acetaminophen (TYLENOL) 650 MG CR tablet Take 650 mg by mouth 2 (two) times daily. For pain  . carboxymethylcellulose (REFRESH PLUS) 0.5 % SOLN 2 drops 2 (two) times daily.   . Dentifrices (BIOTENE DRY MOUTH CARE DT) Place 1 spray onto teeth 4 (four) times daily.  Marland Kitchen dextromethorphan-guaiFENesin (MUCINEX DM) 30-600 MG 12hr tablet Take 1 tablet by mouth 2 (two) times daily as needed.   . Docosanol (ABREVA) 10 % CREA Apply 1 application topically 5 (five) times daily. To affected area until healed and then once daily as needed  . escitalopram (LEXAPRO) 20 MG tablet Take 20 mg by mouth daily. For depression/ anxiety.  . fexofenadine (ALLEGRA) 180 MG tablet Take 180 mg by mouth daily.  Marland Kitchen GLUCERNA (GLUCERNA) LIQD Take 1 Can by mouth 2 (two) times daily between meals. At breakfast and lunch 1 can divided between b/f and lunch  . ipratropium-albuterol (DUONEB) 0.5-2.5 (3) MG/3ML SOLN Take 3 mLs by nebulization every 6 (six) hours as needed (cough/wheezing).   Marland Kitchen levothyroxine (SYNTHROID, LEVOTHROID) 75 MCG tablet Take 75 mcg by mouth daily before breakfast. For thyroid therapy  . liver oil-zinc oxide (DESITIN) 40 % ointment Apply 1 application topically as needed for irritation.  Marland Kitchen LORazepam (ATIVAN) 0.5 MG tablet Take 0.25 mg by mouth at  bedtime.  Marland Kitchen losartan (COZAAR) 25 MG tablet Take 1 tablet (25 mg total) by mouth daily.  . Melatonin 5 MG TABS Take 5 tablets by mouth at bedtime.  . memantine (NAMENDA) 10 MG tablet Take 10 mg by mouth 2 (two) times daily.  . metoprolol tartrate (LOPRESSOR) 25 MG tablet Take 25 mg by mouth 2 (two) times daily.  . Multiple Vitamin (MULTIVITAMIN) tablet Take 1 tablet by mouth daily.  . Multiple Vitamins-Minerals (PRESERVISION AREDS PO) Take 1 tablet by mouth 2 (two) times daily.   Marland Kitchen nystatin (NYSTATIN) powder Apply topically 2 (two) times daily.  Marland Kitchen omeprazole (PRILOSEC) 20 MG capsule Take 20 mg by mouth daily.  Marland Kitchen trolamine salicylate (ASPERCREME) 10 % cream Apply 1 application topically 2 (two) times daily as needed for muscle pain.  Rachel Cooke 14 % LIQD Apply topically as needed (for hemorrhoid flare up).  . [DISCONTINUED] LORazepam (ATIVAN) 0.5 MG tablet Take 1 tablet (0.5 mg total) by mouth at bedtime as needed for anxiety. (Patient taking differently: Take 0.25 mg by mouth at bedtime as needed for anxiety. )   No facility-administered encounter medications on file as of 06/25/2017.     Review of Systems  Constitutional: Positive for fatigue. Negative for appetite change, chills and fever.  HENT: Positive for trouble swallowing. Negative for congestion.   Eyes:       Glasses  Respiratory: Negative for chest tightness and shortness of breath.   Cardiovascular: Positive for leg swelling. Negative for chest pain.       Right side swelling  Gastrointestinal: Negative for abdominal pain, constipation, diarrhea and nausea.  Genitourinary: Negative for dysuria.  Musculoskeletal: Positive for gait problem.    Allergic/Immunologic: Positive for environmental allergies.  Neurological: Positive for weakness and numbness. Negative for dizziness.  Psychiatric/Behavioral: Positive for confusion. Negative for agitation and behavioral problems. The patient is nervous/anxious.     Vitals:   06/25/17 1456  BP: 134/81  Pulse: 62  Resp: 20  Temp: 97.6 F (36.4 C)  TempSrc: Oral  SpO2: 95%  Weight: 185 lb (83.9 kg)  Height: '5\' 1"'$  (1.549 m)   Body mass index is 34.96 kg/m. Physical Exam  Constitutional: She appears well-developed and well-nourished. No distress.  Eyes:  glasses  Cardiovascular: Normal rate, regular rhythm, normal heart sounds and intact distal pulses.  Nonpitting edema of right arm and leg, hose in place, afo  Pulmonary/Chest: Effort normal and breath sounds normal.  Abdominal: Bowel sounds are normal. She exhibits no distension. There is no tenderness.  Musculoskeletal:  Right sided weakness  Neurological: She is alert.  Oriented to person and place, confused some on time during history  Skin: Skin is warm and dry.  Psychiatric: She has a normal mood and affect.    Labs reviewed: Basic Metabolic Panel: Recent Labs    08/23/16 08/29/16 05/06/17 0600  NA 141 140 140  K 4.6 4.2 4.7  BUN '17 15 18  '$ CREATININE 0.6 0.6 0.5   Liver Function Tests: Recent Labs    09/10/16 05/06/17 0600  AST 42* 33  ALT 31 20  ALKPHOS 48 52   No results for input(s): LIPASE, AMYLASE in the last 8760 hours. No results for input(s): AMMONIA in the last 8760 hours. CBC: Recent Labs    09/25/16 0300  WBC 6.8  HGB 13.7  HCT 39  PLT 130*   Cardiac Enzymes: No results for input(s): CKTOTAL, CKMB, CKMBINDEX, TROPONINI in the last 8760 hours. BNP: Invalid input(s): POCBNP Lab  Results  Component Value Date   HGBA1C 8.3 05/06/2017   Lab Results  Component Value Date   TSH 1.48 08/29/2016   Assessment/Plan 1. Atrioventricular block, complete (HCC) -pacer check last month  wnl  2. Esophageal dysmotility -cont modified diet with some snacks as desired   3. Essential hypertension -bp controlled with current regimen, no changes needed  4. Controlled type 2 diabetes mellitus with complication, without long-term current use of insulin ([place]) Lab Results  Component Value Date   HGBA1C 8.3 05/06/2017  this is reasonable for her -she is limited in what foods she can eat, is on glucerna as shake -recheck in June when she gets TSH for year  5. Hypothyroidism, unspecified type -cont current levothyroxine -check tsh in june  6. Hemiparesis affecting right side as late effect of cerebrovascular accident [facility]) -ongoing, requires skilled care for adls, hoyer  7. Vascular dementia without behavioral disturbance -progressing gradually MMSE - Mini Mental State Exam 06/27/2016  Orientation to time 4  Orientation to Place 3  Registration 3  Attention/ Calculation 5  Recall 0  Language- name 2 objects 2  Language- repeat 1  Language- follow 3 step command 2  Language- read & follow direction 1  Write a sentence 1  Copy design 1  Total score 23  continues on namenda, continues to feed herself and can participate in conversation, but poor historian  8. Primary insomnia -sleeps well with lexapro for mood and melatonin for rest, ativan for anxiety  9. Depression, recurrent ([place]) -cont lexapro--has done well with this  10. Anxiety state -cont prn ativan due to periods of anxiety and worry  11. Dyslipidemia -off medication, diet choices limited by dysphagia  12. Obesity (BMI 30.0-34.9) -activity and diet limited, is 84 yo and eating some things she likes can improve quality of life so would not interfere with this  13. Annual physical exam -performed -up to date on preventive care--suspect she have pneumovax pre-epic and it was not transferred  Family/ staff Communication: discussed with snf nurse, days  Labs/tests ordered:  Will need tsh, cbc, cmp,  hba1c in June.  Would not check lipids given goals of care.  Rachel Cooke, D.O. [facility] [address], [place] [address] Cell Phone (Mon-Fri 8am-5pm):  [phone] On Call:  [phone] & follow prompts after 5pm & weekends Office Phone:  [phone] Office Fax:  [phone]

## 2017-07-03 DIAGNOSIS — Z961 Presence of intraocular lens: Secondary | ICD-10-CM | POA: Diagnosis not present

## 2017-07-03 DIAGNOSIS — H353133 Nonexudative age-related macular degeneration, bilateral, advanced atrophic without subfoveal involvement: Secondary | ICD-10-CM | POA: Diagnosis not present

## 2017-07-03 LAB — HM DIABETES EYE EXAM

## 2017-07-09 DIAGNOSIS — C50911 Malignant neoplasm of unspecified site of right female breast: Secondary | ICD-10-CM | POA: Diagnosis not present

## 2017-07-10 ENCOUNTER — Non-Acute Institutional Stay (SKILLED_NURSING_FACILITY): Payer: Medicare Other

## 2017-07-10 DIAGNOSIS — Z0000 Encounter for general adult medical examination without abnormal findings: Secondary | ICD-10-CM | POA: Diagnosis not present

## 2017-07-10 NOTE — Patient Instructions (Signed)
Rachel Cooke , Thank you for taking time to come for your Medicare Wellness Visit. I appreciate your ongoing commitment to your health goals. Please review the following plan we discussed and let me know if I can assist you in the future.   Screening recommendations/referrals: Colonoscopy excluded, over age 82 Mammogram excluded, over age 24 Bone Density up to date Recommended yearly ophthalmology/optometry visit for glaucoma screening and checkup Recommended yearly dental visit for hygiene and checkup  Vaccinations: Influenza vaccine up to date, due 2019 fall season Pneumococcal vaccine 23 due, ordered Tdap vaccine due, ordered Shingles vaccine not in past records    Advanced directives: in chart  Conditions/risks identified: none  Next appointment: Dr. Mariea Clonts makes rounds   Preventive Care 50 Years and Older, Female Preventive care refers to lifestyle choices and visits with your health care provider that can promote health and wellness. What does preventive care include?  A yearly physical exam. This is also called an annual well check.  Dental exams once or twice a year.  Routine eye exams. Ask your health care provider how often you should have your eyes checked.  Personal lifestyle choices, including:  Daily care of your teeth and gums.  Regular physical activity.  Eating a healthy diet.  Avoiding tobacco and drug use.  Limiting alcohol use.  Practicing safe sex.  Taking low-dose aspirin every day.  Taking vitamin and mineral supplements as recommended by your health care provider. What happens during an annual well check? The services and screenings done by your health care provider during your annual well check will depend on your age, overall health, lifestyle risk factors, and family history of disease. Counseling  Your health care provider may ask you questions about your:  Alcohol use.  Tobacco use.  Drug use.  Emotional well-being.  Home and  relationship well-being.  Sexual activity.  Eating habits.  History of falls.  Memory and ability to understand (cognition).  Work and work Statistician.  Reproductive health. Screening  You may have the following tests or measurements:  Height, weight, and BMI.  Blood pressure.  Lipid and cholesterol levels. These may be checked every 5 years, or more frequently if you are over 60 years old.  Skin check.  Lung cancer screening. You may have this screening every year starting at age 44 if you have a 30-pack-year history of smoking and currently smoke or have quit within the past 15 years.  Fecal occult blood test (FOBT) of the stool. You may have this test every year starting at age 52.  Flexible sigmoidoscopy or colonoscopy. You may have a sigmoidoscopy every 5 years or a colonoscopy every 10 years starting at age 20.  Hepatitis C blood test.  Hepatitis B blood test.  Sexually transmitted disease (STD) testing.  Diabetes screening. This is done by checking your blood sugar (glucose) after you have not eaten for a while (fasting). You may have this done every 1-3 years.  Bone density scan. This is done to screen for osteoporosis. You may have this done starting at age 48.  Mammogram. This may be done every 1-2 years. Talk to your health care provider about how often you should have regular mammograms. Talk with your health care provider about your test results, treatment options, and if necessary, the need for more tests. Vaccines  Your health care provider may recommend certain vaccines, such as:  Influenza vaccine. This is recommended every year.  Tetanus, diphtheria, and acellular pertussis (Tdap, Td) vaccine. You may  need a Td booster every 10 years.  Zoster vaccine. You may need this after age 93.  Pneumococcal 13-valent conjugate (PCV13) vaccine. One dose is recommended after age 18.  Pneumococcal polysaccharide (PPSV23) vaccine. One dose is recommended after  age 59. Talk to your health care provider about which screenings and vaccines you need and how often you need them. This information is not intended to replace advice given to you by your health care provider. Make sure you discuss any questions you have with your health care provider. Document Released: 03/18/2015 Document Revised: 11/09/2015 Document Reviewed: 12/21/2014 Elsevier Interactive Patient Education  2017 [place] Prevention in the Home Falls can cause injuries. They can happen to people of all ages. There are many things you can do to make your home safe and to help prevent falls. What can I do on the outside of my home?  Regularly fix the edges of walkways and driveways and fix any cracks.  Remove anything that might make you trip as you walk through a door, such as a raised step or threshold.  Trim any bushes or trees on the path to your home.  Use bright outdoor lighting.  Clear any walking paths of anything that might make someone trip, such as rocks or tools.  Regularly check to see if handrails are loose or broken. Make sure that both sides of any steps have handrails.  Any raised decks and porches should have guardrails on the edges.  Have any leaves, snow, or ice cleared regularly.  Use sand or salt on walking paths during winter.  Clean up any spills in your garage right away. This includes oil or grease spills. What can I do in the bathroom?  Use night lights.  Install grab bars by the toilet and in the tub and shower. Do not use towel bars as grab bars.  Use non-skid mats or decals in the tub or shower.  If you need to sit down in the shower, use a plastic, non-slip stool.  Keep the floor dry. Clean up any water that spills on the floor as soon as it happens.  Remove soap buildup in the tub or shower regularly.  Attach bath mats securely with double-sided non-slip rug tape.  Do not have throw rugs and other things on the floor that can  make you trip. What can I do in the bedroom?  Use night lights.  Make sure that you have a light by your bed that is easy to reach.  Do not use any sheets or blankets that are too big for your bed. They should not hang down onto the floor.  Have a firm chair that has side arms. You can use this for support while you get dressed.  Do not have throw rugs and other things on the floor that can make you trip. What can I do in the kitchen?  Clean up any spills right away.  Avoid walking on wet floors.  Keep items that you use a lot in easy-to-reach places.  If you need to reach something above you, use a strong step stool that has a grab bar.  Keep electrical cords out of the way.  Do not use floor polish or wax that makes floors slippery. If you must use wax, use non-skid floor wax.  Do not have throw rugs and other things on the floor that can make you trip. What can I do with my stairs?  Do not leave any items on  the stairs.  Make sure that there are handrails on both sides of the stairs and use them. Fix handrails that are broken or loose. Make sure that handrails are as long as the stairways.  Check any carpeting to make sure that it is firmly attached to the stairs. Fix any carpet that is loose or worn.  Avoid having throw rugs at the top or bottom of the stairs. If you do have throw rugs, attach them to the floor with carpet tape.  Make sure that you have a light switch at the top of the stairs and the bottom of the stairs. If you do not have them, ask someone to add them for you. What else can I do to help prevent falls?  Wear shoes that:  Do not have high heels.  Have rubber bottoms.  Are comfortable and fit you well.  Are closed at the toe. Do not wear sandals.  If you use a stepladder:  Make sure that it is fully opened. Do not climb a closed stepladder.  Make sure that both sides of the stepladder are locked into place.  Ask someone to hold it for you,  if possible.  Clearly mark and make sure that you can see:  Any grab bars or handrails.  First and last steps.  Where the edge of each step is.  Use tools that help you move around (mobility aids) if they are needed. These include:  Canes.  Walkers.  Scooters.  Crutches.  Turn on the lights when you go into a dark area. Replace any light bulbs as soon as they burn out.  Set up your furniture so you have a clear path. Avoid moving your furniture around.  If any of your floors are uneven, fix them.  If there are any pets around you, be aware of where they are.  Review your medicines with your doctor. Some medicines can make you feel dizzy. This can increase your chance of falling. Ask your doctor what other things that you can do to help prevent falls. This information is not intended to replace advice given to you by your health care provider. Make sure you discuss any questions you have with your health care provider. Document Released: 12/16/2008 Document Revised: 07/28/2015 Document Reviewed: 03/26/2014 Elsevier Interactive Patient Education  2017 [employer].

## 2017-07-10 NOTE — Progress Notes (Signed)
Subjective:   Rachel Cooke is a 84 y.o. female who presents for Medicare Annual (Subsequent) preventive examination at [facility]  Last AWV-06/27/2016 Objective:     Vitals: BP 126/68 (BP Location: Left Arm, Patient Position: Sitting)   Pulse 84   Temp 97.9 F (36.6 C) (Oral)   Ht 5\' 1"  (1.549 m)   Wt 185 lb (83.9 kg)   SpO2 90%   BMI 34.96 kg/m   Body mass index is 34.96 kg/m.  Advanced Directives 07/10/2017 06/25/2017 01/01/2017 11/20/2016 08/28/2016 07/24/2016 06/27/2016  Does Patient Have a Medical Advance Directive? Yes Yes Yes Yes Yes Yes Yes  Type of Paramedic of [place];Living will;Out of facility DNR (pink MOST or yellow form) [place];Living will;Out of facility DNR (pink MOST or yellow form) Out of facility DNR (pink MOST or yellow form);[place];Living will Out of facility DNR (pink MOST or yellow form);[place];Living will Out of facility DNR (pink MOST or yellow form);[place];Living will [place];Living will;Out of facility DNR (pink MOST or yellow form) [place];Living will;Out of facility DNR (pink MOST or yellow form)  Does patient want to make changes to medical advance directive? No - Patient declined No - Patient declined - - - - No - Patient declined  Copy of [place] in Chart? Yes Yes Yes Yes Yes Yes Yes  Pre-existing out of facility DNR order (yellow form or pink MOST form) Yellow form placed in chart (order not valid for inpatient use);Pink MOST form placed in chart (order not valid for inpatient use) Yellow form placed in chart (order not valid for inpatient use);Pink MOST form placed in chart (order not valid for inpatient use) Yellow form placed in chart (order not valid for inpatient use) Yellow form placed in chart (order not valid for inpatient use) Yellow form placed in chart (order  not valid for inpatient use) Yellow form placed in chart (order not valid for inpatient use) Yellow form placed in chart (order not valid for inpatient use)    Tobacco Social History   Tobacco Use  Smoking Status Former Smoker  . Packs/day: 0.25  . Years: 3.00  . Pack years: 0.75  Smokeless Tobacco Never Used     Counseling given: Not Answered   Clinical Intake:  Pre-visit preparation completed: No  Pain : No/denies pain     Nutritional Risks: None Diabetes: No  How often do you need to have someone help you when you read instructions, pamphlets, or other written materials from your doctor or pharmacy?: 3 - Sometimes  Interpreter Needed?: No  Information entered by :: Tyson Dense, RN  Past Medical History:  Diagnosis Date  . Abnormality of gait 2007  . Acquired cyst of kidney 2002  . Acute sinus infection 11/25/2012  . Allergic rhinitis due to pollen   . Altered mental status 11/28/2012  . Anxiety state, unspecified   . Atrioventricular block, complete ([place]) 2003   s/p PPM 2003, generator chnage 2011  . Cardiac pacemaker in situ 2003  . Coronary atherosclerosis of native coronary artery 2003   non obstructive by Cath 2003  . CVA (cerebral infarction) 06/28/2012   rt hemiparesis, dysphagia  . Dendritic keratitis 2012  . Dysuria   . Edema 2003  . Esophageal dysmotility 08/19/2012  . Food impaction of esophagus 08/15/2012  . HTN (hypertension) 06/28/2012  . Hypertension   . Macular degeneration (  senile) of retina, unspecified 2012  . Malignant neoplasm of breast (female), unspecified site 1970   S/P Rt radical mastectomy  . NSTEMI (non-ST elevated myocardial infarction) ([place]) 06/28/2012  . Osteoarthrosis, unspecified whether generalized or localized, unspecified site 2013  . Pacemaker   . Personal history of colonic polyps     s/p colonoscopy/polypectomy 2003  . Pure hyperglyceridemia 2004  . Senile osteoporosis 2003  . Stroke ([place]) 06/28/2012  . Tinnitus 2007   . Type II or unspecified type diabetes mellitus without mention of complication, not stated as uncontrolled 2003  . Unspecified arthropathy, pelvic region and thigh 2010  . Unspecified glaucoma(365.9) 2013  . Unspecified hypothyroidism 2009  . Unspecified vitamin D deficiency 2009  . Urinary retention 07/07/2012   Past Surgical History:  Procedure Laterality Date  . BOTOX INJECTION N/A 10/03/2012   Procedure: BOTOX INJECTION;  Surgeon: [name], MD;  Location: [facility] ENDOSCOPY;  Service: Endoscopy;  Laterality: N/A;  . ESOPHAGOGASTRODUODENOSCOPY N/A 08/15/2012   Procedure: ESOPHAGOGASTRODUODENOSCOPY (EGD);  Surgeon: [name], MD;  Location: [facility]COPY;  Service: Endoscopy;  Laterality: N/A;  . ESOPHAGOGASTRODUODENOSCOPY N/A 08/15/2012   Procedure: ESOPHAGOGASTRODUODENOSCOPY (EGD);  Surgeon: [name], MD;  Location: [facility];  Service: Endoscopy;  Laterality: N/A;  . ESOPHAGOGASTRODUODENOSCOPY N/A 10/03/2012   Procedure: ESOPHAGOGASTRODUODENOSCOPY (EGD);  Surgeon: [name], MD;  Location: [facility];  Service: Endoscopy;  Laterality: N/A;  . ESOPHAGOGASTRODUODENOSCOPY N/A 12/22/2012   Procedure: ESOPHAGOGASTRODUODENOSCOPY (EGD);  Surgeon: [name], MD;  Location[facility]COPY;  Service: Endoscopy;  Laterality: N/A;  . ESOPHAGOSCOPY N/A 08/15/2012   Procedure: ESOPHAGOSCOPY;  Surgeon: [name], MD;  Location: [facility];  Service: ENT;  Laterality: N/A;  . FOREIGN BODY REMOVAL ESOPHAGEAL N/A 08/15/2012   Procedure: REMOVAL FOREIGN BODY ESOPHAGEAL;  Surgeon: [name], MD;  Location: [facility];  Service: ENT;  Laterality: N/A;  . INSERT / REPLACE / REMOVE PACEMAKER    . MASTECTOMY Right 1970   Cancer  . SAVORY DILATION N/A 10/03/2012   Procedure: SAVORY DILATION;  Surgeon: [name], MD;  Location: [facility] ENDOSCOPY;  Service: Endoscopy;  Laterality: N/A;   Family History  Problem Relation Age of Onset  . Hypertension Father    Social History   Socioeconomic History  . Marital  status: Widowed    Spouse name: Not on file  . Number of children: 3  . Years of education: 12th  . Highest education level: Not on file  Occupational History  . Occupation: retired  Scientific laboratory technician  . Financial resource strain: Not hard at all  . Food insecurity:    Worry: Never true    Inability: Never true  . Transportation needs:    Medical: No    Non-medical: No  Tobacco Use  . Smoking status: Former Smoker    Packs/day: 0.25    Years: 3.00    Pack years: 0.75  . Smokeless tobacco: Never Used  Substance and Sexual Activity  . Alcohol use: No  . Drug use: No  . Sexual activity: Never  Lifestyle  . Physical activity:    Days per week: 0 days    Minutes per session: 0 min  . Stress: Only a little  Relationships  . Social connections:    Talks on phone: More than three times a week    Gets together: More than three times a week    Attends religious service: Never    Active member of club or organization: No  Attends meetings of clubs or organizations: Never    Relationship status: Widowed  Other Topics Concern  . Not on file  Social History Narrative  . Not on file    Outpatient Encounter Medications as of 07/10/2017  Medication Sig  . acetaminophen (TYLENOL) 650 MG CR tablet Take 650 mg by mouth 2 (two) times daily. For pain  . carboxymethylcellulose (REFRESH PLUS) 0.5 % SOLN 2 drops 2 (two) times daily.   . Dentifrices (BIOTENE DRY MOUTH CARE DT) Place 1 spray onto teeth 4 (four) times daily.  Marland Kitchen dextromethorphan-guaiFENesin (MUCINEX DM) 30-600 MG 12hr tablet Take 1 tablet by mouth 2 (two) times daily as needed.   . Docosanol (ABREVA) 10 % CREA Apply 1 application topically 5 (five) times daily. To affected area until healed and then once daily as needed  . escitalopram (LEXAPRO) 20 MG tablet Take 20 mg by mouth daily. For depression/ anxiety.  . fexofenadine (ALLEGRA) 180 MG tablet Take 180 mg by mouth daily.  Marland Kitchen GLUCERNA (GLUCERNA) LIQD Take 1 Can by mouth 2 (two)  times daily between meals. At breakfast and lunch 1 can divided between b/f and lunch  . ipratropium-albuterol (DUONEB) 0.5-2.5 (3) MG/3ML SOLN Take 3 mLs by nebulization every 6 (six) hours as needed (cough/wheezing).  Marland Kitchen levothyroxine (SYNTHROID, LEVOTHROID) 75 MCG tablet Take 75 mcg by mouth daily before breakfast. For thyroid therapy  . liver oil-zinc oxide (DESITIN) 40 % ointment Apply 1 application topically as needed for irritation.  Marland Kitchen LORazepam (ATIVAN) 0.5 MG tablet Take 0.25 mg by mouth at bedtime.  Marland Kitchen losartan (COZAAR) 25 MG tablet Take 1 tablet (25 mg total) by mouth daily.  . Melatonin 5 MG TABS Take 5 tablets by mouth at bedtime.  . memantine (NAMENDA) 10 MG tablet Take 10 mg by mouth 2 (two) times daily.  . metoprolol tartrate (LOPRESSOR) 25 MG tablet Take 25 mg by mouth 2 (two) times daily.  . Multiple Vitamin (MULTIVITAMIN) tablet Take 1 tablet by mouth daily.  . Multiple Vitamins-Minerals (PRESERVISION AREDS PO) Take 1 tablet by mouth 2 (two) times daily.   Marland Kitchen nystatin (NYSTATIN) powder Apply topically 2 (two) times daily.  Marland Kitchen omeprazole (PRILOSEC) 20 MG capsule Take 20 mg by mouth daily.  Marland Kitchen trolamine salicylate (ASPERCREME) 10 % cream Apply 1 application topically 2 (two) times daily as needed for muscle pain.  Addison Lank Hazel 14 % LIQD Apply topically as needed (for hemorrhoid flare up).   No facility-administered encounter medications on file as of 07/10/2017.     Activities of Daily Living In your present state of health, do you have any difficulty performing the following activities: 07/10/2017  Hearing? N  Vision? N  Difficulty concentrating or making decisions? Y  Walking or climbing stairs? Y  Dressing or bathing? Y  Doing errands, shopping? Y  Preparing Food and eating ? Y  Using the Toilet? Y  In the past six months, have you accidently leaked urine? N  Do you have problems with loss of bowel control? N  Managing your Medications? Y  Managing your Finances? Y    Housekeeping or managing your Housekeeping? Y  Some recent data might be hidden    Patient Care Team: [name], DO as PCP - General (Geriatric Medicine) [facility], Altamese Dilling, MD as Attending Physician (Gastroenterology) Garvin Fila, MD as Consulting Physician (Neurology)    Assessment:   This is a routine wellness examination for [facility].  Exercise Activities and Dietary recommendations Current  Exercise Habits: The patient does not participate in regular exercise at present, Exercise limited by: orthopedic condition(s)  Goals    None      Fall Risk Fall Risk  07/10/2017 06/27/2016 11/07/2015 01/24/2015  Falls in the past year? No No No No  Risk for fall due to : - - History of fall(s) -   Is the patient's home free of loose throw rugs in walkways, pet beds, electrical cords, etc?   yes      Grab bars in the bathroom? yes      Handrails on the stairs?   yes      Adequate lighting?   yes  Depression Screen PHQ 2/9 Scores 07/10/2017 06/27/2016 11/07/2015 01/24/2015  PHQ - 2 Score 0 0 2 0  PHQ- 9 Score - - 3 -     Cognitive Function completed within last year MMSE - [place] Exam 03/13/2017 06/27/2016  Orientation to time 5 4  Orientation to Place 4 3  Registration 1 3  Attention/ Calculation 5 5  Recall 1 0  Language- name 2 objects 2 2  Language- repeat 1 1  Language- follow 3 step command 3 2  Language- read & follow direction 1 1  Write a sentence 1 1  Copy design 1 1  Total score 25 23        Immunization History  Administered Date(s) Administered  . Influenza Inj Mdck Quad Pf 12/22/2015  . Influenza Whole 12/17/2012  . Influenza-Unspecified 12/08/2013, 12/16/2014, 12/27/2016  . PPD Test 07/18/2012  . Pneumococcal Conjugate-13 08/27/2015    Qualifies for Shingles Vaccine? Not in past records  Screening Tests Health Maintenance  Topic Date Due  . OPHTHALMOLOGY EXAM  07/09/2017  . TETANUS/TDAP  02/18/2018  (Originally 10/29/1938)  . PNA vac Low Risk Adult (2 of 2 - PPSV23) 02/18/2018 (Originally 08/26/2016)  . INFLUENZA VACCINE  10/03/2017  . HEMOGLOBIN A1C  11/06/2017  . FOOT EXAM  06/26/2018    Cancer Screenings: Lung: Low Dose CT Chest recommended if Age 67-80 years, 30 pack-year currently smoking OR have quit w/in 15years. Patient does not qualify. Breast:  Up to date on Mammogram? Yes   Up to date of Bone Density/Dexa? Yes Colorectal: up to date  Additional Screenings:  Hepatitis C Screening: declined Had eye exam yesterday Pneumovax due-ordered TDAP due-ordered    Plan:    I have personally reviewed and addressed the Medicare Annual Wellness questionnaire and have noted the following in the patient's chart:  A. Medical and social history B. Use of alcohol, tobacco or illicit drugs  C. Current medications and supplements D. Functional ability and status E.  Nutritional status F.  Physical activity G. Advance directives H. List of other physicians I.  Hospitalizations, surgeries, and ER visits in previous 12 months J.  [place] to include hearing, vision, cognitive, depression L. Referrals and appointments - none  In addition, I have reviewed and discussed with patient certain preventive protocols, quality metrics, and best practice recommendations. A written personalized care plan for preventive services as well as general preventive health recommendations were provided to patient.  See attached scanned questionnaire for additional information.   Signed,   Tyson Dense, RN Nurse Health Advisor  Patient Concerns: Has has a slight sore throat since this morning

## 2017-07-15 ENCOUNTER — Non-Acute Institutional Stay (SKILLED_NURSING_FACILITY): Payer: Medicare Other | Admitting: Adult Health

## 2017-07-15 DIAGNOSIS — E118 Type 2 diabetes mellitus with unspecified complications: Secondary | ICD-10-CM

## 2017-07-15 DIAGNOSIS — F0150 Vascular dementia without behavioral disturbance: Secondary | ICD-10-CM

## 2017-07-15 DIAGNOSIS — I679 Cerebrovascular disease, unspecified: Secondary | ICD-10-CM

## 2017-07-15 DIAGNOSIS — F339 Major depressive disorder, recurrent, unspecified: Secondary | ICD-10-CM | POA: Diagnosis not present

## 2017-07-15 DIAGNOSIS — K224 Dyskinesia of esophagus: Secondary | ICD-10-CM

## 2017-07-18 ENCOUNTER — Encounter: Payer: Self-pay | Admitting: Adult Health

## 2017-07-18 NOTE — Assessment & Plan Note (Signed)
No longer on plavix due to heme pos stools. No longer receiving lipid monitoring due to lack of benefit.

## 2017-07-18 NOTE — Assessment & Plan Note (Addendum)
Continue diet changes recommended by Dietary A1C recheck in June

## 2017-07-18 NOTE — Assessment & Plan Note (Signed)
Continue modified diet and asp prec 

## 2017-07-18 NOTE — Progress Notes (Signed)
Location:  Occupational psychologist of Service:  SNF (31) Provider:   Cindi Carbon, ANP [place] [phone]   [name], DO  Patient Care Team: [name], DO as PCP - General (Geriatric Medicine) Community, [facility] [name], Altamese Dilling, MD as Attending Physician (Gastroenterology) Garvin Fila, MD as Consulting Physician (Neurology)  Extended Emergency Contact Information Primary Emergency Contact: [place] Address: [address]          [place], [place] [address] [name] of [place] Phone: [phone] Mobile Phone: [phone] Relation: Daughter  Code Status:  DNR Goals of care: Advanced Directive information Advanced Directives 07/10/2017  Does Patient Have a Medical Advance Directive? Yes  Type of Paramedic of [place];Living will;Out of facility DNR (pink MOST or yellow form)  Does patient want to make changes to medical advance directive? No - Patient declined  Copy of [place] in Chart? Yes  Pre-existing out of facility DNR order (yellow form or pink MOST form) Yellow form placed in chart (order not valid for inpatient use);Pink MOST form placed in chart (order not valid for inpatient use)     Chief Complaint  Patient presents with  . Medical Management of Chronic Issues    HPI:  Pt is a 84 y.o. female seen today for medical management of chronic diseases. She resides in skilled care due to a previous CVA with right sided weakness.  DMII: CBG 157-168, diet changes recommended by dietary to increase fiber to help with blood sugars.  Lab Results  Component Value Date   HGBA1C 8.3 05/06/2017    Cerebrovascular dz with associated vascular dementia Short term memory is declining, currently on Namenda  Esophageal dysmotility: no issues with choking or cough Currently on baby food diet and makes concessions with goldfish and chocolate  Depression:  no current symptoms, sleeps well, good appetite, no crying episodes   Past Medical History:  Diagnosis Date  . Abnormality of gait 2007  . Acquired cyst of kidney 2002  . Acute sinus infection 11/25/2012  . Allergic rhinitis due to pollen   . Altered mental status 11/28/2012  . Anxiety state, unspecified   . Atrioventricular block, complete ([place]) 2003   s/p PPM 2003, generator chnage 2011  . Cardiac pacemaker in situ 2003  . Coronary atherosclerosis of native coronary artery 2003   non obstructive by Cath 2003  . CVA (cerebral infarction) 06/28/2012   rt hemiparesis, dysphagia  . Dendritic keratitis 2012  . Dysuria   . Edema 2003  . Esophageal dysmotility 08/19/2012  . Food impaction of esophagus 08/15/2012  . HTN (hypertension) 06/28/2012  . Hypertension   . Macular degeneration (senile) of retina, unspecified 2012  . Malignant neoplasm of breast (female), unspecified site 1970   S/P Rt radical mastectomy  . NSTEMI (non-ST elevated myocardial infarction) ([place]) 06/28/2012  . Osteoarthrosis, unspecified whether generalized or localized, unspecified site 2013  . Pacemaker   . Personal history of colonic polyps     s/p colonoscopy/polypectomy 2003  . Pure hyperglyceridemia 2004  . Senile osteoporosis 2003  . Stroke ([place]) 06/28/2012  . Tinnitus 2007  . Type II or unspecified type diabetes mellitus without mention of complication, not stated as uncontrolled 2003  . Unspecified arthropathy, pelvic region and thigh 2010  . Unspecified glaucoma(365.9) 2013  . Unspecified hypothyroidism 2009  . Unspecified vitamin D deficiency 2009  . Urinary retention 07/07/2012   Past Surgical History:  Procedure Laterality  Date  . BOTOX INJECTION N/A 10/03/2012   Procedure: BOTOX INJECTION;  Surgeon: [name], MD;  [facility]: [facility];  Service: Endoscopy;  Laterality: N/A;  . ESOPHAGOGASTRODUODENOSCOPY N/A 08/15/2012   Procedure: ESOPHAGOGASTRODUODENOSCOPY (EGD);  Surgeon: [name], MD;   Location: [facility] ENDOSCOPY;  Service: Endoscopy;  Laterality: N/A;  . ESOPHAGOGASTRODUODENOSCOPY N/A 08/15/2012   Procedure: ESOPHAGOGASTRODUODENOSCOPY (EGD);  Surgeon: [name], MD;  Location: [place];  Service: Endoscopy;  Laterality: N/A;  . ESOPHAGOGASTRODUODENOSCOPY N/A 10/03/2012   Procedure: ESOPHAGOGASTRODUODENOSCOPY (EGD);  Surgeon: [name], MD;  Location: [facility]OSCOPY;  Service: Endoscopy;  Laterality: N/A;  . ESOPHAGOGASTRODUODENOSCOPY N/A 12/22/2012   Procedure: ESOPHAGOGASTRODUODENOSCOPY (EGD);  Surgeon: [name], MD;  Location: [facility] ENDOSCOPY;  Service: Endoscopy;  Laterality: N/A;  . ESOPHAGOSCOPY N/A 08/15/2012   Procedure: ESOPHAGOSCOPY;  Surgeon: [name], MD;  Location: [place];  Service: ENT;  Laterality: N/A;  . FOREIGN BODY REMOVAL ESOPHAGEAL N/A 08/15/2012   Procedure: REMOVAL FOREIGN BODY ESOPHAGEAL;  Surgeon: [name], MD;  Location: [place];  Service: ENT;  Laterality: N/A;  . INSERT / REPLACE / REMOVE PACEMAKER    . MASTECTOMY Right 1970   Cancer  . SAVORY DILATION N/A 10/03/2012   Procedure: SAVORY DILATION;  Surgeon: [name], MD;  [facility];  Service: Endoscopy;  Laterality: N/A;    Allergies  Allergen Reactions  . Augmentin [Amoxicillin-Pot Clavulanate] Nausea And Vomiting  . Lactose Intolerance (Gi)     Outpatient Encounter Medications as of 07/15/2017  Medication Sig  . acetaminophen (TYLENOL) 650 MG CR tablet Take 650 mg by mouth 2 (two) times daily. For pain  . carboxymethylcellulose (REFRESH PLUS) 0.5 % SOLN 2 drops 2 (two) times daily.   . Dentifrices (BIOTENE DRY MOUTH CARE DT) Place 1 spray onto teeth 4 (four) times daily.  Marland Kitchen dextromethorphan-guaiFENesin (MUCINEX DM) 30-600 MG 12hr tablet Take 1 tablet by mouth 2 (two) times daily as needed.   . Docosanol (ABREVA) 10 % CREA Apply 1 application topically 5 (five) times daily. To affected area until healed and then once daily as needed  . escitalopram (LEXAPRO) 20 MG tablet Take 20  mg by mouth daily. For depression/ anxiety.  . fexofenadine (ALLEGRA) 180 MG tablet Take 180 mg by mouth daily.  Marland Kitchen GLUCERNA (GLUCERNA) LIQD Take 1 Can by mouth 2 (two) times daily between meals. At breakfast and lunch 1 can divided between b/f and lunch  . ipratropium-albuterol (DUONEB) 0.5-2.5 (3) MG/3ML SOLN Take 3 mLs by nebulization every 6 (six) hours as needed (cough/wheezing).  Marland Kitchen levothyroxine (SYNTHROID, LEVOTHROID) 75 MCG tablet Take 75 mcg by mouth daily before breakfast. For thyroid therapy  . liver oil-zinc oxide (DESITIN) 40 % ointment Apply 1 application topically as needed for irritation.  Marland Kitchen LORazepam (ATIVAN) 0.5 MG tablet Take 0.25 mg by mouth at bedtime.  Marland Kitchen losartan (COZAAR) 25 MG tablet Take 1 tablet (25 mg total) by mouth daily.  . Melatonin 5 MG TABS Take 5 tablets by mouth at bedtime.  . memantine (NAMENDA) 10 MG tablet Take 10 mg by mouth 2 (two) times daily.  . metoprolol tartrate (LOPRESSOR) 25 MG tablet Take 25 mg by mouth 2 (two) times daily.  . Multiple Vitamin (MULTIVITAMIN) tablet Take 1 tablet by mouth daily.  . Multiple Vitamins-Minerals (PRESERVISION AREDS PO) Take 1 tablet by mouth 2 (two) times daily.   Marland Kitchen nystatin (NYSTATIN) powder Apply topically 2 (two) times daily.  Marland Kitchen omeprazole (PRILOSEC) 20 MG capsule  Take 20 mg by mouth daily.  Marland Kitchen trolamine salicylate (ASPERCREME) 10 % cream Apply 1 application topically 2 (two) times daily as needed for muscle pain.  Addison Lank Hazel 14 % LIQD Apply topically as needed (for hemorrhoid flare up).   No facility-administered encounter medications on file as of 07/15/2017.     Review of Systems  Constitutional: Negative for activity change, appetite change, chills, diaphoresis, fatigue, fever and unexpected weight change.  HENT: Negative for congestion.   Respiratory: Negative for cough, shortness of breath and wheezing.   Cardiovascular: Positive for leg swelling. Negative for chest pain and palpitations.  Gastrointestinal:  Negative for abdominal distention, abdominal pain, constipation and diarrhea.  Genitourinary: Negative for difficulty urinating and dysuria.  Musculoskeletal: Positive for gait problem. Negative for arthralgias, back pain, joint swelling and myalgias.  Neurological: Positive for weakness. Negative for dizziness, tremors, seizures, syncope, facial asymmetry, speech difficulty, light-headedness, numbness and headaches.  Psychiatric/Behavioral: Positive for confusion. Negative for agitation and behavioral problems.    Immunization History  Administered Date(s) Administered  . Influenza Inj Mdck Quad Pf 12/22/2015  . Influenza Whole 12/17/2012  . Influenza-Unspecified 12/08/2013, 12/16/2014, 12/27/2016  . PPD Test 07/18/2012  . Pneumococcal Conjugate-13 08/27/2015   Pertinent  Health Maintenance Due  Topic Date Due  . PNA vac Low Risk Adult (2 of 2 - PPSV23) 02/18/2018 (Originally 08/26/2016)  . INFLUENZA VACCINE  10/03/2017  . HEMOGLOBIN A1C  11/06/2017  . FOOT EXAM  06/26/2018  . OPHTHALMOLOGY EXAM  07/04/2018   Fall Risk  07/10/2017 06/27/2016 11/07/2015 01/24/2015  Falls in the past year? No No No No  Risk for fall due to : - - History of fall(s) -   Functional Status Survey:    Vitals:   07/18/17 1547  Weight: 187 lb 3.2 oz (84.9 kg)   Body mass index is 35.37 kg/m.  Wt Readings from Last 3 Encounters:  07/18/17 187 lb 3.2 oz (84.9 kg)  07/10/17 185 lb (83.9 kg)  06/25/17 185 lb (83.9 kg)   Physical Exam  Constitutional: She is oriented to person, place, and time. No distress.  HENT:  Head: Normocephalic and atraumatic.  Neck: No JVD present.  Cardiovascular: Normal rate and regular rhythm.  No murmur heard. BLE edema R>L  Pulmonary/Chest: Effort normal and breath sounds normal. No respiratory distress. She has no wheezes.  Abdominal: Soft. Bowel sounds are normal.  Musculoskeletal: She exhibits edema (right arm). She exhibits no tenderness or deformity.  Neurological:  She is alert and oriented to person, place, and time.  Right sided weakness   Skin: Skin is warm and dry. She is not diaphoretic.  Psychiatric: She has a normal mood and affect.  Nursing note and vitals reviewed.   Labs reviewed: Recent Labs    08/23/16 08/29/16 05/06/17 0600  NA 141 140 140  K 4.6 4.2 4.7  BUN 17 15 18   CREATININE 0.6 0.6 0.5   Recent Labs    09/10/16 05/06/17 0600  AST 42* 33  ALT 31 20  ALKPHOS 48 52   Recent Labs    09/25/16 0300  WBC 6.8  HGB 13.7  HCT 39  PLT 130*   Lab Results  Component Value Date   TSH 1.48 08/29/2016   Lab Results  Component Value Date   HGBA1C 8.3 05/06/2017   Lab Results  Component Value Date   CHOL 135 09/19/2015   HDL 50 09/19/2015   LDLCALC 55 09/19/2015   TRIG 149 09/19/2015   CHOLHDL  4.1 07/01/2012    Significant Diagnostic Results in last 30 days:  No results found.  Assessment/Plan  Vascular dementia without behavioral disturbance Short term memory loss worsening Continue Namenda.   Controlled type 2 diabetes mellitus with complication, without long-term current use of insulin (HCC) Continue diet changes recommended by Dietary A1C recheck in June  Depression, recurrent (HCC) Continue Lexapro 20 mg qd. Would not taper at this point as she frequently complains about her health situation and diet. This depresses her and the medication seems to help.  Cerebrovascular disease No longer on plavix due to heme pos stools. No longer receiving lipid monitoring due to lack of benefit.   Esophageal dysmotility Continue modified diet and asp prec.    Family/ staff Communication: resident and staff  Labs/tests ordered:  NA

## 2017-07-18 NOTE — Assessment & Plan Note (Signed)
Short term memory loss worsening Continue Namenda.

## 2017-07-18 NOTE — Assessment & Plan Note (Addendum)
Continue Lexapro 20 mg qd. Would not taper at this point as she frequently complains about her health situation and diet. This depresses her and the medication seems to help.

## 2017-07-25 ENCOUNTER — Non-Acute Institutional Stay (SKILLED_NURSING_FACILITY): Payer: Medicare Other | Admitting: Adult Health

## 2017-07-25 ENCOUNTER — Encounter: Payer: Self-pay | Admitting: Adult Health

## 2017-07-25 DIAGNOSIS — B372 Candidiasis of skin and nail: Secondary | ICD-10-CM

## 2017-07-25 NOTE — Progress Notes (Signed)
Location:  Occupational psychologist of Service:  SNF (31) Provider:   Cindi Carbon, ANP [place] [phone]   [name], DO  Patient Care Team: [name], DO as PCP - General (Geriatric Medicine) Community, Well [name], Altamese Dilling, MD as Attending Physician (Gastroenterology) Garvin Fila, MD as Consulting Physician (Neurology)  Extended Emergency Contact Information Primary Emergency Contact: [place] Address: [address]          [place], [place] [address] [name] of [place] Phone: [phone] Mobile Phone: [phone] Relation: Daughter  Code Status:  DNR Goals of care: Advanced Directive information Advanced Directives 07/10/2017  Does Patient Have a Medical Advance Directive? Yes  Type of Paramedic of [place];Living will;Out of facility DNR (pink MOST or yellow form)  Does patient want to make changes to medical advance directive? No - Patient declined  Copy of [place] in Chart? Yes  Pre-existing out of facility DNR order (yellow form or pink MOST form) Yellow form placed in chart (order not valid for inpatient use);Pink MOST form placed in chart (order not valid for inpatient use)     Chief Complaint  Patient presents with  . Acute Visit    rash    HPI:  Pt is a 84 y.o. female seen today for an acute visit for rash to the groin area. The staff asked me to see this area for concern of shingles. She has had a rash the groin area (both sides) for two weeks. They have tried Nystatin powder but it has not helped. The resident reports discomfort but no numbness, tingling, or pain. There is also associated itching. No fever, drainage, or blisters.   Past Medical History:  Diagnosis Date  . Abnormality of gait 2007  . Acquired cyst of kidney 2002  . Acute sinus infection 11/25/2012  . Allergic rhinitis due to pollen   . Altered mental status  11/28/2012  . Anxiety state, unspecified   . Atrioventricular block, complete ([place]) 2003   s/p PPM 2003, generator chnage 2011  . Cardiac pacemaker in situ 2003  . Coronary atherosclerosis of native coronary artery 2003   non obstructive by Cath 2003  . CVA (cerebral infarction) 06/28/2012   rt hemiparesis, dysphagia  . Dendritic keratitis 2012  . Dysuria   . Edema 2003  . Esophageal dysmotility 08/19/2012  . Food impaction of esophagus 08/15/2012  . HTN (hypertension) 06/28/2012  . Hypertension   . Macular degeneration (senile) of retina, unspecified 2012  . Malignant neoplasm of breast (female), unspecified site 1970   S/P Rt radical mastectomy  . NSTEMI (non-ST elevated myocardial infarction) ([place]) 06/28/2012  . Osteoarthrosis, unspecified whether generalized or localized, unspecified site 2013  . Pacemaker   . Personal history of colonic polyps     s/p colonoscopy/polypectomy 2003  . Pure hyperglyceridemia 2004  . Senile osteoporosis 2003  . Stroke ([place]) 06/28/2012  . Tinnitus 2007  . Type II or unspecified type diabetes mellitus without mention of complication, not stated as uncontrolled 2003  . Unspecified arthropathy, pelvic region and thigh 2010  . Unspecified glaucoma(365.9) 2013  . Unspecified hypothyroidism 2009  . Unspecified vitamin D deficiency 2009  . Urinary retention 07/07/2012   Past Surgical History:  Procedure Laterality Date  . BOTOX INJECTION N/A 10/03/2012   Procedure: BOTOX INJECTION;  Surgeon: [name], MD;  [facility];  Service: Endoscopy;  Laterality: N/A;  . ESOPHAGOGASTRODUODENOSCOPY N/A 08/15/2012  Procedure: ESOPHAGOGASTRODUODENOSCOPY (EGD);  Surgeon: [name], MD;  Location: [facility] ENDOSCOPY;  Service: Endoscopy;  Laterality: N/A;  . ESOPHAGOGASTRODUODENOSCOPY N/A 08/15/2012   Procedure: ESOPHAGOGASTRODUODENOSCOPY (EGD);  Surgeon: [name], MD;  Location: [place];  Service: Endoscopy;  Laterality: N/A;  . ESOPHAGOGASTRODUODENOSCOPY N/A  10/03/2012   Procedure: ESOPHAGOGASTRODUODENOSCOPY (EGD);  Surgeon: [name], MD;  Location: [facility];  Service: Endoscopy;  Laterality: N/A;  . ESOPHAGOGASTRODUODENOSCOPY N/A 12/22/2012   Procedure: ESOPHAGOGASTRODUODENOSCOPY (EGD);  Surgeon: [name], MD;  Location: [facility] ENDOSCOPY;  Service: Endoscopy;  Laterality: N/A;  . ESOPHAGOSCOPY N/A 08/15/2012   Procedure: ESOPHAGOSCOPY;  Surgeon: [name], MD;  Location: [place];  Service: ENT;  Laterality: N/A;  . FOREIGN BODY REMOVAL ESOPHAGEAL N/A 08/15/2012   Procedure: REMOVAL FOREIGN BODY ESOPHAGEAL;  Surgeon: [name], MD;  Location: [place];  Service: ENT;  Laterality: N/A;  . INSERT / REPLACE / REMOVE PACEMAKER    . MASTECTOMY Right 1970   Cancer  . SAVORY DILATION N/A 10/03/2012   Procedure: SAVORY DILATION;  Surgeon: [name], MD;  [facility];  Service: Endoscopy;  Laterality: N/A;    Allergies  Allergen Reactions  . Augmentin [Amoxicillin-Pot Clavulanate] Nausea And Vomiting  . Lactose Intolerance (Gi)     Outpatient Encounter Medications as of 07/25/2017  Medication Sig  . acetaminophen (TYLENOL) 650 MG CR tablet Take 650 mg by mouth 2 (two) times daily. For pain  . carboxymethylcellulose (REFRESH PLUS) 0.5 % SOLN 2 drops 2 (two) times daily.   . Dentifrices (BIOTENE DRY MOUTH CARE DT) Place 1 spray onto teeth 4 (four) times daily.  Marland Kitchen dextromethorphan-guaiFENesin (MUCINEX DM) 30-600 MG 12hr tablet Take 1 tablet by mouth 2 (two) times daily as needed.   . Docosanol (ABREVA) 10 % CREA Apply 1 application topically 5 (five) times daily. To affected area until healed and then once daily as needed  . escitalopram (LEXAPRO) 20 MG tablet Take 20 mg by mouth daily. For depression/ anxiety.  . fexofenadine (ALLEGRA) 180 MG tablet Take 180 mg by mouth daily.  Marland Kitchen GLUCERNA (GLUCERNA) LIQD Take 1 Can by mouth 2 (two) times daily between meals. At breakfast and lunch 1 can divided between b/f and lunch  .  ipratropium-albuterol (DUONEB) 0.5-2.5 (3) MG/3ML SOLN Take 3 mLs by nebulization every 6 (six) hours as needed (cough/wheezing).  Marland Kitchen levothyroxine (SYNTHROID, LEVOTHROID) 75 MCG tablet Take 75 mcg by mouth daily before breakfast. For thyroid therapy  . liver oil-zinc oxide (DESITIN) 40 % ointment Apply 1 application topically as needed for irritation.  Marland Kitchen LORazepam (ATIVAN) 0.5 MG tablet Take 0.25 mg by mouth at bedtime.  Marland Kitchen losartan (COZAAR) 25 MG tablet Take 1 tablet (25 mg total) by mouth daily.  . Melatonin 5 MG TABS Take 5 tablets by mouth at bedtime.  . memantine (NAMENDA) 10 MG tablet Take 10 mg by mouth 2 (two) times daily.  . metoprolol tartrate (LOPRESSOR) 25 MG tablet Take 25 mg by mouth 2 (two) times daily.  . Multiple Vitamin (MULTIVITAMIN) tablet Take 1 tablet by mouth daily.  . Multiple Vitamins-Minerals (PRESERVISION AREDS PO) Take 1 tablet by mouth 2 (two) times daily.   Marland Kitchen nystatin (NYSTATIN) powder Apply topically 2 (two) times daily.  Marland Kitchen omeprazole (PRILOSEC) 20 MG capsule Take 20 mg by mouth daily.  Marland Kitchen trolamine salicylate (ASPERCREME) 10 % cream Apply 1 application topically 2 (two) times daily as needed for muscle pain.  Addison Lank Hazel 14 % LIQD Apply  topically as needed (for hemorrhoid flare up).   No facility-administered encounter medications on file as of 07/25/2017.     Review of Systems  Constitutional: Negative for activity change, appetite change, chills, diaphoresis, fatigue, fever and unexpected weight change.  Skin: Positive for color change and rash. Negative for pallor and wound.    Immunization History  Administered Date(s) Administered  . Influenza Inj Mdck Quad Pf 12/22/2015  . Influenza Whole 12/17/2012  . Influenza-Unspecified 12/08/2013, 12/16/2014, 12/27/2016  . PPD Test 07/18/2012  . Pneumococcal Conjugate-13 08/27/2015   Pertinent  Health Maintenance Due  Topic Date Due  . PNA vac Low Risk Adult (2 of 2 - PPSV23) 02/18/2018 (Originally 08/26/2016)   . INFLUENZA VACCINE  10/03/2017  . HEMOGLOBIN A1C  11/06/2017  . FOOT EXAM  06/26/2018  . OPHTHALMOLOGY EXAM  07/04/2018   Fall Risk  07/10/2017 06/27/2016 11/07/2015 01/24/2015  Falls in the past year? No No No No  Risk for fall due to : - - History of fall(s) -   Functional Status Survey:    Vitals:   07/25/17 1104  BP: 120/73  Pulse: 73  Resp: 20  Temp: 97.6 F (36.4 C)   There is no height or weight on file to calculate BMI. Physical Exam  Constitutional: She is oriented to person, place, and time. No distress.  Neurological: She is alert and oriented to person, place, and time.  Skin: Skin is warm and dry. She is not diaphoretic. There is erythema (maculopapular rash to the bilateral groin area. No drainage or tenderness.).    Labs reviewed: Recent Labs    08/23/16 08/29/16 05/06/17 0600  NA 141 140 140  K 4.6 4.2 4.7  BUN 17 15 18   CREATININE 0.6 0.6 0.5   Recent Labs    09/10/16 05/06/17 0600  AST 42* 33  ALT 31 20  ALKPHOS 48 52   Recent Labs    09/25/16 0300  WBC 6.8  HGB 13.7  HCT 39  PLT 130*   Lab Results  Component Value Date   TSH 1.48 08/29/2016   Lab Results  Component Value Date   HGBA1C 8.3 05/06/2017   Lab Results  Component Value Date   CHOL 135 09/19/2015   HDL 50 09/19/2015   LDLCALC 55 09/19/2015   TRIG 149 09/19/2015   CHOLHDL 4.1 07/01/2012    Significant Diagnostic Results in last 30 days:  No results found.  Assessment/Plan  1. Candidal intertrigo Diflucan 150 mg x 1 dose Nystatin BID cream to groin area x 10 days   Family/ staff Communication: staff/resident  Labs/tests ordered:  NA

## 2017-07-30 ENCOUNTER — Encounter: Payer: Self-pay | Admitting: Internal Medicine

## 2017-07-30 ENCOUNTER — Non-Acute Institutional Stay (SKILLED_NURSING_FACILITY): Payer: Medicare Other | Admitting: Internal Medicine

## 2017-07-30 DIAGNOSIS — Z7189 Other specified counseling: Secondary | ICD-10-CM

## 2017-07-30 DIAGNOSIS — B029 Zoster without complications: Secondary | ICD-10-CM | POA: Diagnosis not present

## 2017-07-30 DIAGNOSIS — R410 Disorientation, unspecified: Secondary | ICD-10-CM | POA: Diagnosis not present

## 2017-07-30 DIAGNOSIS — B372 Candidiasis of skin and nail: Secondary | ICD-10-CM

## 2017-07-30 DIAGNOSIS — R52 Pain, unspecified: Secondary | ICD-10-CM | POA: Diagnosis not present

## 2017-07-30 NOTE — Progress Notes (Signed)
Patient ID: Rachel Cooke, female   DOB: [birth year], 84 y.o.   MRN: [id number]  [facility] Room Number: [place] of Service:  SNF ([phone]) Provider:   [name], DO  Patient Care Team: [name], DO as PCP - General (Geriatric Medicine) [name], Altamese Dilling, MD as Attending Physician (Gastroenterology) Garvin Fila, MD as Consulting Physician (Neurology)  Extended Emergency Contact Information Primary Emergency Contact: [place] Address: [address]          [place], [place] [address] [name] of [place] Phone: [phone] Mobile Phone: [phone] Relation: Daughter  Code Status:  DNR Goals of care: Advanced Directive information Advanced Directives 07/30/2017  Does Patient Have a Medical Advance Directive? Yes  Type of Paramedic of [place];Living will;Out of facility DNR (pink MOST or yellow form)  Does patient want to make changes to medical advance directive? No - Patient declined  Copy of [place] in Chart? Yes  Pre-existing out of facility DNR order (yellow form or pink MOST form) Yellow form placed in chart (order not valid for inpatient use);Pink MOST form placed in chart (order not valid for inpatient use)     Chief Complaint  Patient presents with  . Acute Visit    left side pain under breast, suspected shingles?    HPI:  Pt is a 84 y.o. female with moderate dementia, prior stroke, esophageal dysmotility, DMII, hypothyroidism,OA, depression, chronic venous insufficiency, prior breast cancer with lymphedema of arm and obesity seen today for an acute visit for severe pain on left side felt to be shingles, diffuse pain, delirium, and overall decline since this began.  Family was asking about hospice care and getting her off opioids which she does not tolerate well.    5/23, she was seen by NP for a groin rash felt to be  candidal and treated with nystatin and iflucan.  There was improvement.   Pt has done fine with bowels moving with large bm sat, none yesterday or Sunday, but small this am.  She's had wet briefs.  Intake was poor over the weekend--ate nothing yesterday and had a small amount of breakfast this morning.  She remains very lethargic and tender all over her body.    Past Medical History:  Diagnosis Date  . Abnormality of gait 2007  . Acquired cyst of kidney 2002  . Acute sinus infection 11/25/2012  . Allergic rhinitis due to pollen   . Altered mental status 11/28/2012  . Anxiety state, unspecified   . Atrioventricular block, complete ([place]) 2003   s/p PPM 2003, generator chnage 2011  . Cardiac pacemaker in situ 2003  . Coronary atherosclerosis of native coronary artery 2003   non obstructive by Cath 2003  . CVA (cerebral infarction) 06/28/2012   rt hemiparesis, dysphagia  . Dendritic keratitis 2012  . Dysuria   . Edema 2003  . Esophageal dysmotility 08/19/2012  . Food impaction of esophagus 08/15/2012  . HTN (hypertension) 06/28/2012  . Hypertension   . Macular degeneration (senile) of retina, unspecified 2012  . Malignant neoplasm of breast (female), unspecified site 1970   S/P Rt radical mastectomy  . NSTEMI (non-ST elevated myocardial infarction) ([facility]) 06/28/2012  . Osteoarthrosis, unspecified whether generalized or localized, unspecified site 2013  . Pacemaker   . Personal history of colonic polyps     s/p colonoscopy/polypectomy 2003  . Pure hyperglyceridemia 2004  . Senile osteoporosis 2003  .  Stroke ([place]) 06/28/2012  . Tinnitus 2007  . Type II or unspecified type diabetes mellitus without mention of complication, not stated as uncontrolled 2003  . Unspecified arthropathy, pelvic region and thigh 2010  . Unspecified glaucoma(365.9) 2013  . Unspecified hypothyroidism 2009  . Unspecified vitamin D deficiency 2009  . Urinary retention 07/07/2012   Past Surgical History:  Procedure  Laterality Date  . BOTOX INJECTION N/A 10/03/2012   Procedure: BOTOX INJECTION;  Surgeon: [name], MD;  Location: [facility];  Service: Endoscopy;  Laterality: N/A;  . ESOPHAGOGASTRODUODENOSCOPY N/A 08/15/2012   Procedure: ESOPHAGOGASTRODUODENOSCOPY (EGD);  Surgeon: [name], MD;  Location: [facility] ENDOSCOPY;  Service: Endoscopy;  Laterality: N/A;  . ESOPHAGOGASTRODUODENOSCOPY N/A 08/15/2012   Procedure: ESOPHAGOGASTRODUODENOSCOPY (EGD);  Surgeon: [name], MD;  Location: [facility];  Service: Endoscopy;  Laterality: N/A;  . ESOPHAGOGASTRODUODENOSCOPY N/A 10/03/2012   Procedure: ESOPHAGOGASTRODUODENOSCOPY (EGD);  Surgeon: [name], MD;  Location: [facility];  Service: Endoscopy;  Laterality: N/A;  . ESOPHAGOGASTRODUODENOSCOPY N/A 12/22/2012   Procedure: ESOPHAGOGASTRODUODENOSCOPY (EGD);  Surgeon: [name], MD;  Location: [facility] ENDOSCOPY;  Service: Endoscopy;  Laterality: N/A;  . ESOPHAGOSCOPY N/A 08/15/2012   Procedure: ESOPHAGOSCOPY;  Surgeon: [name], MD;  Location: [facility];  Service: ENT;  Laterality: N/A;  . FOREIGN BODY REMOVAL ESOPHAGEAL N/A 08/15/2012   Procedure: REMOVAL FOREIGN BODY ESOPHAGEAL;  Surgeon: [name], MD;  Location: [facility];  Service: ENT;  Laterality: N/A;  . INSERT / REPLACE / REMOVE PACEMAKER    . MASTECTOMY Right 1970   Cancer  . SAVORY DILATION N/A 10/03/2012   Procedure: SAVORY DILATION;  Surgeon: [name], MD;  Location: [facility] ENDOS[facility];  Service: Endoscopy;  Laterality: N/A;    Allergies  Allergen Reactions  . Augmentin [Amoxicillin-Pot Clavulanate] Nausea And Vomiting  . Lactose Intolerance (Gi)     Outpatient Encounter Medications as of 07/30/2017  Medication Sig  . acetaminophen (TYLENOL) 650 MG CR tablet Take 650 mg by mouth 2 (two) times daily. For pain  . carboxymethylcellulose (REFRESH PLUS) 0.5 % SOLN 2 drops 2 (two) times daily.   . cetirizine (ZYRTEC) 10 MG tablet Take 10 mg by mouth daily as needed (itching).  . Dentifrices (BIOTENE DRY  MOUTH CARE DT) Place 1 spray onto teeth 4 (four) times daily.  Marland Kitchen dextromethorphan-guaiFENesin (MUCINEX DM) 30-600 MG 12hr tablet Take 1 tablet by mouth 2 (two) times daily as needed.   . Docosanol (ABREVA) 10 % CREA Apply 1 application topically 5 (five) times daily. To affected area until healed and then once daily as needed  . escitalopram (LEXAPRO) 20 MG tablet Take 20 mg by mouth daily. For depression/ anxiety.  . fexofenadine (ALLEGRA) 180 MG tablet Take 180 mg by mouth daily.  Marland Kitchen gabapentin (NEURONTIN) 100 MG capsule Take 100 mg by mouth 2 (two) times daily.  Marland Kitchen ibuprofen (ADVIL,MOTRIN) 200 MG tablet Take 400 mg by mouth every 8 (eight) hours as needed.  Marland Kitchen ipratropium-albuterol (DUONEB) 0.5-2.5 (3) MG/3ML SOLN Take 3 mLs by nebulization every 6 (six) hours as needed (cough/wheezing).  Marland Kitchen levothyroxine (SYNTHROID, LEVOTHROID) 75 MCG tablet Take 75 mcg by mouth daily before breakfast. For thyroid therapy  . liver oil-zinc oxide (DESITIN) 40 % ointment Apply 1 application topically as needed for irritation.  Marland Kitchen LORazepam (ATIVAN) 0.5 MG tablet Take 0.25 mg by mouth at bedtime. Give every 4 hours as needed for agitation  . losartan (COZAAR) 25 MG tablet Take 1 tablet (25 mg total) by  mouth daily.  . Melatonin 5 MG TABS Take 5 tablets by mouth at bedtime.  . memantine (NAMENDA) 10 MG tablet Take 10 mg by mouth 2 (two) times daily.  . metoprolol tartrate (LOPRESSOR) 25 MG tablet Take 25 mg by mouth 2 (two) times daily.  . Multiple Vitamin (MULTIVITAMIN) tablet Take 1 tablet by mouth daily.  . Multiple Vitamins-Minerals (PRESERVISION AREDS PO) Take 1 tablet by mouth 2 (two) times daily.   . Nutritional Supplements (BOOST GLUCOSE CONTROL) LIQD Take by mouth 2 (two) times daily.  Marland Kitchen nystatin (NYSTATIN) powder Apply topically 2 (two) times daily.  Marland Kitchen omeprazole (PRILOSEC) 20 MG capsule Take 20 mg by mouth daily.  Marland Kitchen oxybutynin (DITROPAN) 5 MG tablet Take 5 mg by mouth every 4 (four) hours as needed.  .  trolamine salicylate (ASPERCREME) 10 % cream Apply 1 application topically 2 (two) times daily as needed for muscle pain.  . valACYclovir (VALTREX) 1000 MG tablet Take 1,000 mg by mouth 3 (three) times daily.  Addison Lank Hazel 14 % LIQD Apply topically as needed (for hemorrhoid flare up).  . [DISCONTINUED] GLUCERNA (GLUCERNA) LIQD Take 1 Can by mouth 2 (two) times daily between meals. At breakfast and lunch 1 can divided between b/f and lunch   No facility-administered encounter medications on file as of 07/30/2017.     Review of Systems  Constitutional: Positive for activity change, appetite change and fatigue. Negative for chills, diaphoresis and fever.       T99 axillary this am  HENT: Negative for congestion.   Eyes: Negative for visual disturbance.  Respiratory: Negative for chest tightness and shortness of breath.   Cardiovascular: Positive for leg swelling. Negative for chest pain and palpitations.  Gastrointestinal: Positive for abdominal pain and constipation. Negative for blood in stool, diarrhea and nausea.  Genitourinary: Negative for dysuria.  Musculoskeletal: Positive for arthralgias, gait problem and myalgias.  Skin: Positive for rash. Negative for color change.  Neurological: Positive for weakness. Negative for dizziness.  Hematological: Bruises/bleeds easily.  Psychiatric/Behavioral: Positive for confusion and sleep disturbance. Negative for agitation and behavioral problems. The patient is nervous/anxious.     Immunization History  Administered Date(s) Administered  . Influenza Inj Mdck Quad Pf 12/22/2015  . Influenza Whole 12/17/2012  . Influenza-Unspecified 12/08/2013, 12/16/2014, 12/27/2016  . PPD Test 07/18/2012  . Pneumococcal Conjugate-13 08/27/2015   Pertinent  Health Maintenance Due  Topic Date Due  . PNA vac Low Risk Adult (2 of 2 - PPSV23) 02/18/2018 (Originally 08/26/2016)  . INFLUENZA VACCINE  10/03/2017  . HEMOGLOBIN A1C  11/06/2017  . FOOT EXAM   06/26/2018  . OPHTHALMOLOGY EXAM  07/04/2018   Fall Risk  07/10/2017 06/27/2016 11/07/2015 01/24/2015  Falls in the past year? No No No No  Risk for fall due to : - - History of fall(s) -   Functional Status Survey:  currently fully dependent, but normally feeds self and helps staff some with her morning routine  Vitals:   07/30/17 1015  BP: 113/68  Pulse: 91  Resp: (!) 22  Temp: 99 F (37.2 C)  TempSrc: Axillary  SpO2: 93%  Weight: 185 lb (83.9 kg)  Height: '5\' 1"'$  (1.549 m)   Body mass index is 34.96 kg/m. Physical Exam  Constitutional: She appears distressed.  Writhing around in bed  Cardiovascular: Normal rate and regular rhythm.  Murmur heard. Chronic edema  Pulmonary/Chest: Effort normal and breath sounds normal. She has no wheezes. She has no rales.  Neurological:  Lethargic, keeps closing  her eyes during visit  Skin:  Erythematous papular appearing rash beneath left breast up to nipple and around past axilla on left--no vesicles or drainage; also moist, shiny erythematous rash in groin and perineal area w/o openings; skin tender to touch all over her body, but most pronounced over left breast, chest and side    Labs reviewed: Recent Labs    08/23/16 08/29/16 05/06/17 0600  NA 141 140 140  K 4.6 4.2 4.7  BUN '17 15 18  '$ CREATININE 0.6 0.6 0.5   Recent Labs    09/10/16 05/06/17 0600  AST 42* 33  ALT 31 20  ALKPHOS 48 52   Recent Labs    09/25/16 0300  WBC 6.8  HGB 13.7  HCT 39  PLT 130*   Lab Results  Component Value Date   TSH 1.48 08/29/2016   Lab Results  Component Value Date   HGBA1C 8.3 05/06/2017   Lab Results  Component Value Date   CHOL 135 09/19/2015   HDL 50 09/19/2015   LDLCALC 55 09/19/2015   TRIG 149 09/19/2015   CHOLHDL 4.1 07/01/2012    Assessment/Plan 1. Herpes zoster infection of thoracic region -odd presentation, complete course of valtrex as ordered -d/c oxycodone due to delirium and not helping -d/c ativan except at hs  (to help with rest at this time) -begin salonpas with lidocaine topical cream tid applied gently to affected area -cont gabapentin  2. Candidal intertrigo -d/c nystatin cream, begin nystatin powder until resolved, keep clean and dry  3. Delirium -ongoing, multifactorial, due to #1, valtrex, oxycodone, ativan, and baseline dementia -try to reduce polypharmacy contribution -also was not eating well over the past few days--encourage hydration and intake, encourage normal sleep cycles  4. Generalized pain -unusual, but seems to be her reaction to shingles--hoping it will be short-term -cont gabapentin--hope to taper after she makes some strides  5.  Advance care planning -met with Rachel Cooke--we agreed to see if her mom improves with less opioids which she's historically had difficulty tolerating, less ativan, and more local, shingles-specific therapy and after she's off valtrex -too early to make a call about whether she'll get back to baseline or declining to require hospice care -does have living will, hcpoa, DNR and MOST forms on file for comfort care and keeping at [facility] for mgt -discussion lasted about 20 mins   Family/ staff Communication: met with Rachel Cooke, pt's daughter, and her husband, also d/w nurse and nurse managers and DON  Labs/tests ordered:  No new

## 2017-08-06 NOTE — ACP (Advance Care Planning) (Signed)
met with Peggy's daughter, [name]--we agreed to see if her mom improves with less opioids which she's historically had difficulty tolerating, less ativan, and more local, shingles-specific therapy and after she's off valtrex -too early to make a call about whether she'll get back to baseline or declining to require hospice care -does have living will, hcpoa, DNR and MOST forms on file for comfort care and keeping at [facility] for mgt -discussion lasted about 20 mins

## 2017-08-07 ENCOUNTER — Telehealth: Payer: Self-pay | Admitting: Cardiology

## 2017-08-07 ENCOUNTER — Ambulatory Visit (INDEPENDENT_AMBULATORY_CARE_PROVIDER_SITE_OTHER): Payer: Medicare Other | Admitting: *Deleted

## 2017-08-07 DIAGNOSIS — I442 Atrioventricular block, complete: Secondary | ICD-10-CM

## 2017-08-07 NOTE — Telephone Encounter (Signed)
LMOVM reminding pt to send remote transmission.   

## 2017-08-09 DIAGNOSIS — E118 Type 2 diabetes mellitus with unspecified complications: Secondary | ICD-10-CM | POA: Diagnosis not present

## 2017-08-09 DIAGNOSIS — E119 Type 2 diabetes mellitus without complications: Secondary | ICD-10-CM | POA: Diagnosis not present

## 2017-08-09 LAB — HEMOGLOBIN A1C: HEMOGLOBIN A1C: 8.3

## 2017-08-14 DIAGNOSIS — M62562 Muscle wasting and atrophy, not elsewhere classified, left lower leg: Secondary | ICD-10-CM | POA: Diagnosis not present

## 2017-08-14 DIAGNOSIS — M79661 Pain in right lower leg: Secondary | ICD-10-CM | POA: Diagnosis not present

## 2017-08-14 DIAGNOSIS — R278 Other lack of coordination: Secondary | ICD-10-CM | POA: Diagnosis not present

## 2017-08-14 DIAGNOSIS — I69351 Hemiplegia and hemiparesis following cerebral infarction affecting right dominant side: Secondary | ICD-10-CM | POA: Diagnosis not present

## 2017-08-14 DIAGNOSIS — M158 Other polyosteoarthritis: Secondary | ICD-10-CM | POA: Diagnosis not present

## 2017-08-14 DIAGNOSIS — M62561 Muscle wasting and atrophy, not elsewhere classified, right lower leg: Secondary | ICD-10-CM | POA: Diagnosis not present

## 2017-08-15 DIAGNOSIS — M62561 Muscle wasting and atrophy, not elsewhere classified, right lower leg: Secondary | ICD-10-CM | POA: Diagnosis not present

## 2017-08-15 DIAGNOSIS — M158 Other polyosteoarthritis: Secondary | ICD-10-CM | POA: Diagnosis not present

## 2017-08-15 DIAGNOSIS — I69351 Hemiplegia and hemiparesis following cerebral infarction affecting right dominant side: Secondary | ICD-10-CM | POA: Diagnosis not present

## 2017-08-15 DIAGNOSIS — M79661 Pain in right lower leg: Secondary | ICD-10-CM | POA: Diagnosis not present

## 2017-08-15 DIAGNOSIS — M62562 Muscle wasting and atrophy, not elsewhere classified, left lower leg: Secondary | ICD-10-CM | POA: Diagnosis not present

## 2017-08-15 DIAGNOSIS — R278 Other lack of coordination: Secondary | ICD-10-CM | POA: Diagnosis not present

## 2017-08-20 DIAGNOSIS — M62561 Muscle wasting and atrophy, not elsewhere classified, right lower leg: Secondary | ICD-10-CM | POA: Diagnosis not present

## 2017-08-20 DIAGNOSIS — R278 Other lack of coordination: Secondary | ICD-10-CM | POA: Diagnosis not present

## 2017-08-20 DIAGNOSIS — M158 Other polyosteoarthritis: Secondary | ICD-10-CM | POA: Diagnosis not present

## 2017-08-20 DIAGNOSIS — I69351 Hemiplegia and hemiparesis following cerebral infarction affecting right dominant side: Secondary | ICD-10-CM | POA: Diagnosis not present

## 2017-08-20 DIAGNOSIS — M79661 Pain in right lower leg: Secondary | ICD-10-CM | POA: Diagnosis not present

## 2017-08-20 DIAGNOSIS — M62562 Muscle wasting and atrophy, not elsewhere classified, left lower leg: Secondary | ICD-10-CM | POA: Diagnosis not present

## 2017-08-23 DIAGNOSIS — M79661 Pain in right lower leg: Secondary | ICD-10-CM | POA: Diagnosis not present

## 2017-08-23 DIAGNOSIS — M62561 Muscle wasting and atrophy, not elsewhere classified, right lower leg: Secondary | ICD-10-CM | POA: Diagnosis not present

## 2017-08-23 DIAGNOSIS — M62562 Muscle wasting and atrophy, not elsewhere classified, left lower leg: Secondary | ICD-10-CM | POA: Diagnosis not present

## 2017-08-23 DIAGNOSIS — R278 Other lack of coordination: Secondary | ICD-10-CM | POA: Diagnosis not present

## 2017-08-23 DIAGNOSIS — M158 Other polyosteoarthritis: Secondary | ICD-10-CM | POA: Diagnosis not present

## 2017-08-23 DIAGNOSIS — I69351 Hemiplegia and hemiparesis following cerebral infarction affecting right dominant side: Secondary | ICD-10-CM | POA: Diagnosis not present

## 2017-08-26 ENCOUNTER — Non-Acute Institutional Stay (SKILLED_NURSING_FACILITY): Payer: Medicare Other | Admitting: Adult Health

## 2017-08-26 DIAGNOSIS — M792 Neuralgia and neuritis, unspecified: Secondary | ICD-10-CM | POA: Diagnosis not present

## 2017-08-26 DIAGNOSIS — E669 Obesity, unspecified: Secondary | ICD-10-CM

## 2017-08-26 DIAGNOSIS — F5101 Primary insomnia: Secondary | ICD-10-CM | POA: Diagnosis not present

## 2017-08-26 DIAGNOSIS — E118 Type 2 diabetes mellitus with unspecified complications: Secondary | ICD-10-CM | POA: Diagnosis not present

## 2017-08-26 DIAGNOSIS — I10 Essential (primary) hypertension: Secondary | ICD-10-CM | POA: Diagnosis not present

## 2017-08-26 DIAGNOSIS — I442 Atrioventricular block, complete: Secondary | ICD-10-CM

## 2017-08-27 DIAGNOSIS — E039 Hypothyroidism, unspecified: Secondary | ICD-10-CM | POA: Diagnosis not present

## 2017-08-27 LAB — TSH: TSH: 2.11 (ref <=5.90)

## 2017-08-28 ENCOUNTER — Encounter: Payer: Self-pay | Admitting: Cardiology

## 2017-08-28 NOTE — Progress Notes (Signed)
Remote pacemaker transmission.   

## 2017-08-29 ENCOUNTER — Encounter: Payer: Self-pay | Admitting: Adult Health

## 2017-08-29 NOTE — Progress Notes (Signed)
Location:  Occupational psychologist of Service:  SNF (31) Provider:   Cindi Carbon, ANP [place] [phone]   [name], DO  Patient Care Team: [name], DO as PCP - General (Geriatric Medicine) [facility], [name], Altamese Dilling, MD as Attending Physician (Gastroenterology) Garvin Fila, MD as Consulting Physician (Neurology)  Extended Emergency Contact Information Primary Emergency Contact: [place] Address: [address]          [place], [place] [address] [name] of [place] Phone: [phone] Mobile Phone: [phone] Relation: Daughter  Code Status:  DNR Goals of care: Advanced Directive information Advanced Directives 07/30/2017  Does Patient Have a Medical Advance Directive? Yes  Type of Paramedic of [place];Living will;Out of facility DNR (pink MOST or yellow form)  Does patient want to make changes to medical advance directive? No - Patient declined  Copy of [place] in Chart? Yes  Pre-existing out of facility DNR order (yellow form or pink MOST form) Yellow form placed in chart (order not valid for inpatient use);Pink MOST form placed in chart (order not valid for inpatient use)     Chief Complaint  Patient presents with  . Medical Management of Chronic Issues    HPI:  Pt is a 84 y.o. female seen today for medical management of chronic diseases.   She was treated for shingles on 07/30/17 with valtrex, which has resolved. She remains on neurontin for neuropathic pain and denies pain. She reports feeling sleepy during the day. Denies insomnia, currently sleeping 8-9 hrs at night.   DM II: CBG check q monday range 133-155 Lab Results  Component Value Date   HGBA1C 8.3 08/09/2017   Her weight is down 4 lbs since her acute illness which is favorable Body mass index is 34.58 kg/m.  HTN: controlled Hx of pacemaker with routine remote  transmissions   Past Medical History:  Diagnosis Date  . Abnormality of gait 2007  . Acquired cyst of kidney 2002  . Acute sinus infection 11/25/2012  . Allergic rhinitis due to pollen   . Altered mental status 11/28/2012  . Anxiety state, unspecified   . Atrioventricular block, complete ([place]) 2003   s/p PPM 2003, generator chnage 2011  . Cardiac pacemaker in situ 2003  . Coronary atherosclerosis of native coronary artery 2003   non obstructive by Cath 2003  . CVA (cerebral infarction) 06/28/2012   rt hemiparesis, dysphagia  . Dendritic keratitis 2012  . Dysuria   . Edema 2003  . Esophageal dysmotility 08/19/2012  . Food impaction of esophagus 08/15/2012  . HTN (hypertension) 06/28/2012  . Hypertension   . Macular degeneration (senile) of retina, unspecified 2012  . Malignant neoplasm of breast (female), unspecified site 1970   S/P Rt radical mastectomy  . NSTEMI (non-ST elevated myocardial infarction) ([place]) 06/28/2012  . Osteoarthrosis, unspecified whether generalized or localized, unspecified site 2013  . Pacemaker   . Personal history of colonic polyps     s/p colonoscopy/polypectomy 2003  . Pure hyperglyceridemia 2004  . Senile osteoporosis 2003  . Stroke ([place]) 06/28/2012  . Tinnitus 2007  . Type II or unspecified type diabetes mellitus without mention of complication, not stated as uncontrolled 2003  . Unspecified arthropathy, pelvic region and thigh 2010  . Unspecified glaucoma(365.9) 2013  . Unspecified hypothyroidism 2009  . Unspecified vitamin D deficiency 2009  . Urinary retention 07/07/2012   Past Surgical History:  Procedure Laterality Date  .  BOTOX INJECTION N/A 10/03/2012   Procedure: BOTOX INJECTION;  Surgeon: [name], MD;  [facility]: [facility];  Service: Endoscopy;  Laterality: N/A;  . ESOPHAGOGASTRODUODENOSCOPY N/A 08/15/2012   Procedure: ESOPHAGOGASTRODUODENOSCOPY (EGD);  Surgeon: [name], MD;  Location: [facility] ENDOSCOPY;  Service: Endoscopy;  Laterality:  N/A;  . ESOPHAGOGASTRODUODENOSCOPY N/A 08/15/2012   Procedure: ESOPHAGOGASTRODUODENOSCOPY (EGD);  Surgeon: [name], MD;  Location: [facility];  Service: Endoscopy;  Laterality: N/A;  . ESOPHAGOGASTRODUODENOSCOPY N/A 10/03/2012   Procedure: ESOPHAGOGASTRODUODENOSCOPY (EGD);  Surgeon: [name], MD;  Location: [facility] END[facility]COPY;  Service: Endoscopy;  Laterality: N/A;  . ESOPHAGOGASTRODUODENOSCOPY N/A 12/22/2012   Procedure: ESOPHAGOGASTRODUODENOSCOPY (EGD);  Surgeon: [name], MD;  Location: [facility] ENDOSCOPY;  Service: Endoscopy;  Laterality: N/A;  . ESOPHAGOSCOPY N/A 08/15/2012   Procedure: ESOPHAGOSCOPY;  Surgeon: [name], MD;  Location: [facility];  Service: ENT;  Laterality: N/A;  . FOREIGN BODY REMOVAL ESOPHAGEAL N/A 08/15/2012   Procedure: REMOVAL FOREIGN BODY ESOPHAGEAL;  Surgeon: [name], MD;  Location: [facility];  Service: ENT;  Laterality: N/A;  . INSERT / REPLACE / REMOVE PACEMAKER    . MASTECTOMY Right 1970   Cancer  . SAVORY DILATION N/A 10/03/2012   Procedure: SAVORY DILATION;  Surgeon: [name], MD;  [facility];  Service: Endoscopy;  Laterality: N/A;    Allergies  Allergen Reactions  . Augmentin [Amoxicillin-Pot Clavulanate] Nausea And Vomiting  . Lactose Intolerance (Gi)     Outpatient Encounter Medications as of 08/26/2017  Medication Sig  . acetaminophen (TYLENOL) 650 MG CR tablet Take 650 mg by mouth 2 (two) times daily. For pain  . carboxymethylcellulose (REFRESH PLUS) 0.5 % SOLN 2 drops 2 (two) times daily.   . cetirizine (ZYRTEC) 10 MG tablet Take 10 mg by mouth daily as needed (itching).  . Dentifrices (BIOTENE DRY MOUTH CARE DT) Place 1 spray onto teeth 4 (four) times daily.  Marland Kitchen dextromethorphan-guaiFENesin (MUCINEX DM) 30-600 MG 12hr tablet Take 1 tablet by mouth 2 (two) times daily as needed.   . Docosanol (ABREVA) 10 % CREA Apply 1 application topically 5 (five) times daily. To affected area until healed and then once daily as needed  . escitalopram  (LEXAPRO) 20 MG tablet Take 20 mg by mouth daily. For depression/ anxiety.  . fexofenadine (ALLEGRA) 180 MG tablet Take 180 mg by mouth daily.  Marland Kitchen gabapentin (NEURONTIN) 100 MG capsule Take 100 mg by mouth 2 (two) times daily.  Marland Kitchen ipratropium-albuterol (DUONEB) 0.5-2.5 (3) MG/3ML SOLN Take 3 mLs by nebulization every 6 (six) hours as needed (cough/wheezing).  Marland Kitchen levothyroxine (SYNTHROID, LEVOTHROID) 75 MCG tablet Take 75 mcg by mouth daily before breakfast. For thyroid therapy  . liver oil-zinc oxide (DESITIN) 40 % ointment Apply 1 application topically as needed for irritation.  Marland Kitchen LORazepam (ATIVAN) 0.5 MG tablet Take 0.25 mg by mouth at bedtime. Give every 4 hours as needed for agitation  . losartan (COZAAR) 25 MG tablet Take 1 tablet (25 mg total) by mouth daily.  . Melatonin 5 MG TABS Take 5 tablets by mouth at bedtime.  . memantine (NAMENDA) 10 MG tablet Take 10 mg by mouth 2 (two) times daily.  . metoprolol tartrate (LOPRESSOR) 25 MG tablet Take 25 mg by mouth 2 (two) times daily.  . Multiple Vitamin (MULTIVITAMIN) tablet Take 1 tablet by mouth daily.  . Multiple Vitamins-Minerals (PRESERVISION AREDS PO) Take 1 tablet by mouth 2 (two) times daily.   . Nutritional Supplements (BOOST GLUCOSE CONTROL) LIQD  Take by mouth 2 (two) times daily.  Marland Kitchen nystatin (NYSTATIN) powder Apply topically 2 (two) times daily.  Marland Kitchen omeprazole (PRILOSEC) 20 MG capsule Take 20 mg by mouth daily.  Marland Kitchen trolamine salicylate (ASPERCREME) 10 % cream Apply 1 application topically 2 (two) times daily as needed for muscle pain.  Addison Lank Hazel 14 % LIQD Apply topically as needed (for hemorrhoid flare up).  . [DISCONTINUED] ibuprofen (ADVIL,MOTRIN) 200 MG tablet Take 400 mg by mouth every 8 (eight) hours as needed.   No facility-administered encounter medications on file as of 08/26/2017.     Review of Systems  Constitutional: Positive for fatigue. Negative for activity change, appetite change, chills, diaphoresis, fever and  unexpected weight change.  HENT: Negative for congestion.   Respiratory: Negative for cough, shortness of breath and wheezing.   Cardiovascular: Positive for leg swelling. Negative for chest pain and palpitations.  Gastrointestinal: Negative for abdominal distention, abdominal pain, constipation and diarrhea.  Genitourinary: Negative for difficulty urinating and dysuria.  Musculoskeletal: Positive for arthralgias and gait problem. Negative for back pain, joint swelling and myalgias.  Skin: Negative for rash and wound.  Neurological: Positive for weakness. Negative for dizziness, tremors, seizures, syncope, facial asymmetry, speech difficulty, light-headedness, numbness and headaches.  Psychiatric/Behavioral: Positive for confusion. Negative for agitation and behavioral problems.    Immunization History  Administered Date(s) Administered  . Influenza Inj Mdck Quad Pf 12/22/2015  . Influenza Whole 12/17/2012  . Influenza-Unspecified 12/08/2013, 12/16/2014, 12/27/2016  . PPD Test 07/18/2012  . Pneumococcal Conjugate-13 08/27/2015   Pertinent  Health Maintenance Due  Topic Date Due  . PNA vac Low Risk Adult (2 of 2 - PPSV23) 02/18/2018 (Originally 08/26/2016)  . INFLUENZA VACCINE  10/03/2017  . HEMOGLOBIN A1C  02/08/2018  . FOOT EXAM  06/26/2018  . OPHTHALMOLOGY EXAM  07/04/2018   Fall Risk  07/10/2017 06/27/2016 11/07/2015 01/24/2015  Falls in the past year? No No No No  Risk for fall due to : - - History of fall(s) -   Functional Status Survey:    Vitals:   08/29/17 1541  Weight: 183 lb (83 kg)   Body mass index is 34.58 kg/m.  Wt Readings from Last 3 Encounters:  08/29/17 183 lb (83 kg)  07/30/17 185 lb (83.9 kg)  07/18/17 187 lb 3.2 oz (84.9 kg)   Physical Exam  Constitutional: She is oriented to person, place, and time. No distress.  HENT:  Head: Normocephalic and atraumatic.  Mouth/Throat: No oropharyngeal exudate.  Neck: No JVD present.  Cardiovascular: Normal rate  and regular rhythm.  No murmur heard. Swelling to BLE +1 and RUE +1  Pulmonary/Chest: Effort normal and breath sounds normal. No respiratory distress. She has no wheezes.  Abdominal: Soft. Bowel sounds are normal.  Neurological: She is alert and oriented to person, place, and time.  Skin: Skin is warm and dry. She is not diaphoretic.  Psychiatric: She has a normal mood and affect.    Labs reviewed: Recent Labs    05/06/17 0600  NA 140  K 4.7  BUN 18  CREATININE 0.5   Recent Labs    09/10/16 05/06/17 0600  AST 42* 33  ALT 31 20  ALKPHOS 48 52   Recent Labs    09/25/16 0300  WBC 6.8  HGB 13.7  HCT 39  PLT 130*   Lab Results  Component Value Date   TSH 1.48 08/29/2016   Lab Results  Component Value Date   HGBA1C 8.3 08/09/2017   Lab  Results  Component Value Date   CHOL 135 09/19/2015   HDL 50 09/19/2015   LDLCALC 55 09/19/2015   TRIG 149 09/19/2015   CHOLHDL 4.1 07/01/2012    Significant Diagnostic Results in last 30 days:  No results found.  Assessment/Plan  1. Neuropathic pain Due to shingles which has resolved Trial off neurontin and monitor for increased pain   2. Controlled type 2 diabetes mellitus with complication, without long-term current use of insulin (HCC) Continue to monitor CBG qmonday and A1C q 6 months No aggressive diabetes management given her age/frailty and risk for hypoglycemia  3. Primary insomnia Continue scheduled ativan and melatonin for sleep May consider discontinuing ativan if her daytime sleepiness does not improve off neurontin  4. Obesity (BMI 30.0-34.9) She is immobile and uses the scooter to get around the facility, also she is already on a very limited diet due to her swallowing issues. Dietary has worked with her on reducing items that have sugar such as condiments and increasing fiber intake  5. Atrioventricular block, complete (HCC) pacemker followed by EP  6. Essential hypertension Controlled in review of  matrix blood pressures, continue metoprolol and losartan   Family/ staff Communication: staff  Labs/tests ordered: NA

## 2017-09-09 ENCOUNTER — Encounter: Payer: Self-pay | Admitting: Adult Health

## 2017-09-09 ENCOUNTER — Non-Acute Institutional Stay (SKILLED_NURSING_FACILITY): Payer: Medicare Other | Admitting: Adult Health

## 2017-09-09 DIAGNOSIS — L304 Erythema intertrigo: Secondary | ICD-10-CM | POA: Diagnosis not present

## 2017-09-09 NOTE — Progress Notes (Signed)
Location:  Occupational psychologist of Service:  SNF (31) Provider:  Cindi Carbon, ANP [place] [phone]  [name], DO  Patient Care Team: [name], DO as PCP - General (Geriatric Medicine) [name], Altamese Dilling, MD as Attending Physician (Gastroenterology) Garvin Fila, MD as Consulting Physician (Neurology)  Extended Emergency Contact Information Primary Emergency Contact: [place] Address: [address]          [place], [place] [address] [name] of [place] Phone: [phone] Mobile Phone: [phone] Relation: Daughter  Code Status:  DNR Goals of care: Advanced Directive information Advanced Directives 07/30/2017  Does Patient Have a Medical Advance Directive? Yes  Type of Paramedic of [place];Living will;Out of facility DNR (pink MOST or yellow form)  Does patient want to make changes to medical advance directive? No - Patient declined  Copy of [place] in Chart? Yes  Pre-existing out of facility DNR order (yellow form or pink MOST form) Yellow form placed in chart (order not valid for inpatient use);Pink MOST form placed in chart (order not valid for inpatient use)     Chief Complaint  Patient presents with  . Acute Visit    rash    HPI:  Pt is a 84 y.o. female seen today for an acute visit for rash to the groin area. She is currently using antifungal cream but continues with itching, discomfort to the bilateral groin area with redness. Took one dose of Diflucan to bilateral groin area in May with relief.  No fever or difficulty urinating. She is mostly continent of urine but wears a brief for protection.    Past Medical History:  Diagnosis Date  . Abnormality of gait 2007  . Acquired cyst of kidney 2002  . Acute sinus infection 11/25/2012  . Allergic rhinitis due to pollen   . Altered mental status 11/28/2012  . Anxiety  state, unspecified   . Atrioventricular block, complete ([place]) 2003   s/p PPM 2003, generator chnage 2011  . Cardiac pacemaker in situ 2003  . Coronary atherosclerosis of native coronary artery 2003   non obstructive by Cath 2003  . CVA (cerebral infarction) 06/28/2012   rt hemiparesis, dysphagia  . Dendritic keratitis 2012  . Dysuria   . Edema 2003  . Esophageal dysmotility 08/19/2012  . Food impaction of esophagus 08/15/2012  . HTN (hypertension) 06/28/2012  . Hypertension   . Macular degeneration (senile) of retina, unspecified 2012  . Malignant neoplasm of breast (female), unspecified site 1970   S/P Rt radical mastectomy  . NSTEMI (non-ST elevated myocardial infarction) ([place]) 06/28/2012  . Osteoarthrosis, unspecified whether generalized or localized, unspecified site 2013  . Pacemaker   . Personal history of colonic polyps     s/p colonoscopy/polypectomy 2003  . Pure hyperglyceridemia 2004  . Senile osteoporosis 2003  . Stroke ([place]) 06/28/2012  . Tinnitus 2007  . Type II or unspecified type diabetes mellitus without mention of complication, not stated as uncontrolled 2003  . Unspecified arthropathy, pelvic region and thigh 2010  . Unspecified glaucoma(365.9) 2013  . Unspecified hypothyroidism 2009  . Unspecified vitamin D deficiency 2009  . Urinary retention 07/07/2012   Past Surgical History:  Procedure Laterality Date  . BOTOX INJECTION N/A 10/03/2012   Procedure: BOTOX INJECTION;  Surgeon: [name], MD;  [facility];  Service: Endoscopy;  Laterality: N/A;  . ESOPHAGOGASTRODUODENOSCOPY N/A 08/15/2012   Procedure: ESOPHAGOGASTRODUODENOSCOPY (EGD);  Surgeon:  [name], MD;  Location[facility] ENDOSCOPY;  Service: Endoscopy;  Laterality: N/A;  . ESOPHAGOGASTRODUODENOSCOPY N/A 08/15/2012   Procedure: ESOPHAGOGASTRODUODENOSCOPY (EGD);  Surgeon: [name], MD;  Location: [facility];  Service: Endoscopy;  Laterality: N/A;  . ESOPHAGOGASTRODUODENOSCOPY N/A 10/03/2012   Procedure:  ESOPHAGOGASTRODUODENOSCOPY (EGD);  Surgeon: [name], MD;  Location: [facility];  Service: Endoscopy;  Laterality: N/A;  . ESOPHAGOGASTRODUODENOSCOPY N/A 12/22/2012   Procedure: ESOPHAGOGASTRODUODENOSCOPY (EGD);  Surgeon: [name], MD;  Location: [facility] ENDOSCOPY;  Service: Endoscopy;  Laterality: N/A;  . ESOPHAGOSCOPY N/A 08/15/2012   Procedure: ESOPHAGOSCOPY;  Surgeon: [name], MD;  Location: [facility];  Service: ENT;  Laterality: N/A;  . FOREIGN BODY REMOVAL ESOPHAGEAL N/A 08/15/2012   Procedure: REMOVAL FOREIGN BODY ESOPHAGEAL;  Surgeon: [name], MD;  Location: [facility];  Service: ENT;  Laterality: N/A;  . INSERT / REPLACE / REMOVE PACEMAKER    . MASTECTOMY Right 1970   Cancer  . SAVORY DILATION N/A 10/03/2012   Procedure: SAVORY DILATION;  Surgeon: [name], MD;  [facility];  Service: Endoscopy;  Laterality: N/A;    Allergies  Allergen Reactions  . Augmentin [Amoxicillin-Pot Clavulanate] Nausea And Vomiting  . Lactose Intolerance (Gi)     Outpatient Encounter Medications as of 09/09/2017  Medication Sig  . acetaminophen (TYLENOL) 650 MG CR tablet Take 650 mg by mouth 2 (two) times daily. For pain  . carboxymethylcellulose (REFRESH PLUS) 0.5 % SOLN 2 drops 2 (two) times daily.   . cetirizine (ZYRTEC) 10 MG tablet Take 10 mg by mouth daily as needed (itching).  . Dentifrices (BIOTENE DRY MOUTH CARE DT) Place 1 spray onto teeth 4 (four) times daily.  Marland Kitchen dextromethorphan-guaiFENesin (MUCINEX DM) 30-600 MG 12hr tablet Take 1 tablet by mouth 2 (two) times daily as needed.   . Docosanol (ABREVA) 10 % CREA Apply 1 application topically 5 (five) times daily. To affected area until healed and then once daily as needed  . escitalopram (LEXAPRO) 20 MG tablet Take 20 mg by mouth daily. For depression/ anxiety.  . fexofenadine (ALLEGRA) 180 MG tablet Take 180 mg by mouth daily.  Marland Kitchen gabapentin (NEURONTIN) 100 MG capsule Take 100 mg by mouth 2 (two) times daily.  Marland Kitchen  ipratropium-albuterol (DUONEB) 0.5-2.5 (3) MG/3ML SOLN Take 3 mLs by nebulization every 6 (six) hours as needed (cough/wheezing).  Marland Kitchen levothyroxine (SYNTHROID, LEVOTHROID) 75 MCG tablet Take 75 mcg by mouth daily before breakfast. For thyroid therapy  . liver oil-zinc oxide (DESITIN) 40 % ointment Apply 1 application topically as needed for irritation.  Marland Kitchen LORazepam (ATIVAN) 0.5 MG tablet Take 0.25 mg by mouth at bedtime. Give every 4 hours as needed for agitation  . losartan (COZAAR) 25 MG tablet Take 1 tablet (25 mg total) by mouth daily.  . Melatonin 5 MG TABS Take 5 tablets by mouth at bedtime.  . memantine (NAMENDA) 10 MG tablet Take 10 mg by mouth 2 (two) times daily.  . metoprolol tartrate (LOPRESSOR) 25 MG tablet Take 25 mg by mouth 2 (two) times daily.  . Multiple Vitamin (MULTIVITAMIN) tablet Take 1 tablet by mouth daily.  . Multiple Vitamins-Minerals (PRESERVISION AREDS PO) Take 1 tablet by mouth 2 (two) times daily.   . Nutritional Supplements (BOOST GLUCOSE CONTROL) LIQD Take by mouth 2 (two) times daily.  Marland Kitchen nystatin (NYSTATIN) powder Apply topically 2 (two) times daily.  Marland Kitchen omeprazole (PRILOSEC) 20 MG capsule Take 20 mg by mouth daily.  Marland Kitchen trolamine salicylate (ASPERCREME) 10 %  cream Apply 1 application topically 2 (two) times daily as needed for muscle pain.  Addison Lank Hazel 14 % LIQD Apply topically as needed (for hemorrhoid flare up).   No facility-administered encounter medications on file as of 09/09/2017.     Review of Systems  Constitutional: Negative for activity change, appetite change, chills, diaphoresis, fatigue, fever and unexpected weight change.  Genitourinary: Negative for difficulty urinating, dysuria, hematuria and pelvic pain.  Skin: Positive for rash.    Immunization History  Administered Date(s) Administered  . Influenza Inj Mdck Quad Pf 12/22/2015  . Influenza Whole 12/17/2012  . Influenza-Unspecified 12/08/2013, 12/16/2014, 12/27/2016  . PPD Test 07/18/2012    . Pneumococcal Conjugate-13 08/27/2015   Pertinent  Health Maintenance Due  Topic Date Due  . PNA vac Low Risk Adult (2 of 2 - PPSV23) 02/18/2018 (Originally 08/26/2016)  . INFLUENZA VACCINE  10/03/2017  . HEMOGLOBIN A1C  02/08/2018  . FOOT EXAM  06/26/2018  . OPHTHALMOLOGY EXAM  07/04/2018   Fall Risk  07/10/2017 06/27/2016 11/07/2015 01/24/2015  Falls in the past year? No No No No  Risk for fall due to : - - History of fall(s) -   Functional Status Survey:    Vitals:   09/09/17 1131  BP: 123/73  Pulse: 86  Resp: 18  Temp: 98 F (36.7 C)  SpO2: 94%   There is no height or weight on file to calculate BMI. Physical Exam  Constitutional: No distress.  Abdominal: Soft. Bowel sounds are normal. She exhibits no distension.  Skin: Skin is warm and dry. There is erythema (macular rash to bilateral groin area and vaginal area).    Labs reviewed: Recent Labs    05/06/17 0600  NA 140  K 4.7  BUN 18  CREATININE 0.5   Recent Labs    09/10/16 05/06/17 0600  AST 42* 33  ALT 31 20  ALKPHOS 48 52   Recent Labs    09/25/16 0300  WBC 6.8  HGB 13.7  HCT 39  PLT 130*   Lab Results  Component Value Date   TSH 1.48 08/29/2016   Lab Results  Component Value Date   HGBA1C 8.3 08/09/2017   Lab Results  Component Value Date   CHOL 135 09/19/2015   HDL 50 09/19/2015   LDLCALC 55 09/19/2015   TRIG 149 09/19/2015   CHOLHDL 4.1 07/01/2012    Significant Diagnostic Results in last 30 days:  No results found.  Assessment/Plan  1. Intertrigo Has failed previous treatment with continued groin rash and itching Diflucan 150 mg x 1 dose, repeat in 48 hrs Recommend lying down 2-3 times a day open to air to help with moisture    Family/ staff Communication: staff/resident  Labs/tests ordered:  NA

## 2017-09-11 ENCOUNTER — Non-Acute Institutional Stay (SKILLED_NURSING_FACILITY): Payer: Medicare Other | Admitting: Internal Medicine

## 2017-09-11 ENCOUNTER — Encounter: Payer: Self-pay | Admitting: Internal Medicine

## 2017-09-11 ENCOUNTER — Telehealth: Payer: Self-pay | Admitting: *Deleted

## 2017-09-11 DIAGNOSIS — Z7189 Other specified counseling: Secondary | ICD-10-CM | POA: Diagnosis not present

## 2017-09-11 NOTE — Telephone Encounter (Signed)
-----   Message from Hart Robinsons sent at 09/11/2017 10:35 AM EDT ----- Regarding: Son Needs To Speak With Dr. Mariea Clonts Patient's daughter Heywood Iles called.  She stated that patient's son Rachel Cooke will be at [facility] today would like to speak with Dr. Mariea Clonts privately regarding their mother.  He will be at [facility] from about 2pm-8pm today.  Scott's cell number is [phone])

## 2017-09-11 NOTE — ACP (Advance Care Planning) (Signed)
Met with pt's son, Nicki Reaper, today.  He was in town from [place] visiting his mom.  He has noted a tremendous decline in her overall, especially since her shingles outbreak.  She admits to not having the energy she used to have.  She continues to eat very well.  She is anxious about death, per Nicki Reaper, but is not one to discuss this with staff.  Scott asked about palliative care and we reviewed Peggy's MOST form which indicates DNR, comfort measures, no fluids or antibiotics and no tube feeding as per her living will and discussion with patient and POA, [name], Scott's sister.  He is in full agreement with this also.  He also asked when hospice might be considered for her.  We discussed that this depends on her functional status, po intake, and evidence of terminal condition which she does not have at this time.  We also reviewed her medications to ensure all were really necessary at this time. I reassured him that we will make her comfortable at the time of her death when it does come and, if she's eligible for hospice, we can certainly involve [facility].  Scott indicated he really just wanted to meet me when he was here b/c we had not met before since he lives out of town.  Ryah Cribb L. Kais Monje, D.O. [facility] [address], [place] [address] Cell Phone (Mon-Fri 8am-5pm):  [phone] On Call:  [phone] & follow prompts after 5pm & weekends Office Phone:  [phone] Office Fax:  [phone]

## 2017-09-11 NOTE — Telephone Encounter (Signed)
Son would like to talk with Dr.Reed about end of life care and palliative care.

## 2017-09-11 NOTE — Telephone Encounter (Signed)
Met with Scott.  ACP note completed.   

## 2017-09-11 NOTE — Telephone Encounter (Signed)
Spoke with nursing staff at [facility] to ask what questions son has, per verbal from Dr.Reed Haematologist in the office while we where discussing this and they will follow-up)

## 2017-09-11 NOTE — Progress Notes (Signed)
ACP discussion held.  See advance care planning.   Last ACP Note 06/13/2017 to 09/11/2017       ACP (Advance Care Planning) by [name], DO at 09/11/2017  4:33 PM    Date of Service   Author Author Type Status Note Type File Time  09/11/2017 Addend [name], DO Physician Signed ACP (Advance Care Planning) 09/11/2017             Met with pt's son, Nicki Reaper, today.  He was in town from [place] visiting his mom.  He has noted a tremendous decline in her overall, especially since her shingles outbreak.  She admits to not having the energy she used to have.  She continues to eat very well.  She is anxious about death, per Nicki Reaper, but is not one to discuss this with staff.  Scott asked about palliative care and we reviewed Peggy's MOST form which indicates DNR, comfort measures, no fluids or antibiotics and no tube feeding as per her living will and discussion with patient and POA, [name], Scott's sister.  He is in full agreement with this also.  He also asked when hospice might be considered for her.  We discussed that this depends on her functional status, po intake, and evidence of terminal condition which she does not have at this time.  We also reviewed her medications to ensure all were really necessary at this time. I reassured him that we will make her comfortable at the time of her death when it does come and, if she's eligible for hospice, we can certainly involve [facility].  Scott indicated he really just wanted to meet me when he was here b/c we had not met before since he lives out of town.  Nala Kachel L. Keali Mccraw, D.O. [facility] [address], [place] [address] Cell Phone (Mon-Fri 8am-5pm):  [phone] On Call:  [phone] & follow prompts after 5pm & weekends Office Phone:  [phone] Office Fax:  [phone]              More notes were found than are currently displayed.

## 2017-09-17 DIAGNOSIS — E1159 Type 2 diabetes mellitus with other circulatory complications: Secondary | ICD-10-CM | POA: Diagnosis not present

## 2017-09-17 DIAGNOSIS — L84 Corns and callosities: Secondary | ICD-10-CM | POA: Diagnosis not present

## 2017-09-17 DIAGNOSIS — L602 Onychogryphosis: Secondary | ICD-10-CM | POA: Diagnosis not present

## 2017-09-25 LAB — CUP PACEART REMOTE DEVICE CHECK
Battery Remaining Longevity: 51 mo
Battery Voltage: 2.77 V
Brady Statistic AS VS Percent: 0 %
Date Time Interrogation Session: 20190606142139
Implantable Lead Implant Date: 20110506
Implantable Lead Location: 753859
Implantable Lead Location: 753860
Implantable Pulse Generator Implant Date: 20110506
Lead Channel Pacing Threshold Amplitude: 0.875 V
Lead Channel Pacing Threshold Pulse Width: 0.4 ms
Lead Channel Setting Pacing Amplitude: 2 V
Lead Channel Setting Pacing Amplitude: 2.5 V
Lead Channel Setting Pacing Pulse Width: 0.4 ms
Lead Channel Setting Sensing Sensitivity: 2.8 mV
MDC IDC LEAD IMPLANT DT: 20110506
MDC IDC MSMT BATTERY IMPEDANCE: 1117 Ohm
MDC IDC MSMT LEADCHNL RA IMPEDANCE VALUE: 488 Ohm
MDC IDC MSMT LEADCHNL RA PACING THRESHOLD AMPLITUDE: 0.875 V
MDC IDC MSMT LEADCHNL RA PACING THRESHOLD PULSEWIDTH: 0.4 ms
MDC IDC MSMT LEADCHNL RV IMPEDANCE VALUE: 441 Ohm
MDC IDC STAT BRADY AP VP PERCENT: 0 %
MDC IDC STAT BRADY AP VS PERCENT: 0 %
MDC IDC STAT BRADY AS VP PERCENT: 99 %

## 2017-10-03 ENCOUNTER — Non-Acute Institutional Stay (SKILLED_NURSING_FACILITY): Payer: Medicare Other | Admitting: Adult Health

## 2017-10-03 DIAGNOSIS — K224 Dyskinesia of esophagus: Secondary | ICD-10-CM | POA: Diagnosis not present

## 2017-10-03 DIAGNOSIS — I872 Venous insufficiency (chronic) (peripheral): Secondary | ICD-10-CM | POA: Diagnosis not present

## 2017-10-03 DIAGNOSIS — I10 Essential (primary) hypertension: Secondary | ICD-10-CM | POA: Diagnosis not present

## 2017-10-03 DIAGNOSIS — I442 Atrioventricular block, complete: Secondary | ICD-10-CM

## 2017-10-03 DIAGNOSIS — I69351 Hemiplegia and hemiparesis following cerebral infarction affecting right dominant side: Secondary | ICD-10-CM

## 2017-10-03 DIAGNOSIS — L304 Erythema intertrigo: Secondary | ICD-10-CM

## 2017-10-03 DIAGNOSIS — F0150 Vascular dementia without behavioral disturbance: Secondary | ICD-10-CM

## 2017-10-05 ENCOUNTER — Encounter: Payer: Self-pay | Admitting: Adult Health

## 2017-10-05 NOTE — Progress Notes (Signed)
Location:  Occupational psychologist of Service:  SNF (31) Provider:   Cindi Carbon, ANP [place] [phone]   [name], DO  Patient Care Team: [name], DO as PCP - General (Geriatric Medicine) [name], Altamese Dilling, MD as Attending Physician (Gastroenterology) Garvin Fila, MD as Consulting Physician (Neurology)  Extended Emergency Contact Information Primary Emergency Contact: [place] Address: [address]          [place], [place] [address] [name] of [place] Phone: [phone] Mobile Phone: [phone] Relation: Daughter  Code Status:  DNR Goals of care: Advanced Directive information Advanced Directives 07/30/2017  Does Patient Have a Medical Advance Directive? Yes  Type of Paramedic of [place];Living will;Out of facility DNR (pink MOST or yellow form)  Does patient want to make changes to medical advance directive? No - Patient declined  Copy of [place] in Chart? Yes  Pre-existing out of facility DNR order (yellow form or pink MOST form) Yellow form placed in chart (order not valid for inpatient use);Pink MOST form placed in chart (order not valid for inpatient use)     Chief Complaint  Patient presents with  . Medical Management of Chronic Issues    HPI:  Pt is a 84 y.o. female seen today for medical management of chronic diseases.   She is currently being treated for a fungal infection to the groin area with diflucan that is refractory. It is slowly improving per the resident.  She routinely feels fatigued and complains of lack of energy regularly. She has a normal appetite. Her weight has been trending upward over time but has dropped by 4 lbs in the past few months. Her family questioned recently if she was ready for hospice but it does not seem that she is ready for that transition yet. She has a hx of vascular dementia  associated with a stroke but remains oriented and verbal but does show signs of short term memory loss. She still operates her motorized scooter and attends events at [facility]. She denies any complaints of pain.  Hx of esophageal dysmotility with impaction, noted currently on baby food diet with certain concessions made for QOL.   Past Medical History:  Diagnosis Date  . Abnormality of gait 2007  . Acquired cyst of kidney 2002  . Acute sinus infection 11/25/2012  . Allergic rhinitis due to pollen   . Altered mental status 11/28/2012  . Anxiety state, unspecified   . Atrioventricular block, complete ([place]) 2003   s/p PPM 2003, generator chnage 2011  . Cardiac pacemaker in situ 2003  . Coronary atherosclerosis of native coronary artery 2003   non obstructive by Cath 2003  . CVA (cerebral infarction) 06/28/2012   rt hemiparesis, dysphagia  . Dendritic keratitis 2012  . Dysuria   . Edema 2003  . Esophageal dysmotility 08/19/2012  . Food impaction of esophagus 08/15/2012  . HTN (hypertension) 06/28/2012  . Hypertension   . Macular degeneration (senile) of retina, unspecified 2012  . Malignant neoplasm of breast (female), unspecified site 1970   S/P Rt radical mastectomy  . NSTEMI (non-ST elevated myocardial infarction) ([place]) 06/28/2012  . Osteoarthrosis, unspecified whether generalized or localized, unspecified site 2013  . Pacemaker   . Personal history of colonic polyps     s/p colonoscopy/polypectomy 2003  . Pure hyperglyceridemia 2004  . Senile osteoporosis 2003  . Stroke ([place]) 06/28/2012  . Tinnitus 2007  . Type  II or unspecified type diabetes mellitus without mention of complication, not stated as uncontrolled 2003  . Unspecified arthropathy, pelvic region and thigh 2010  . Unspecified glaucoma(365.9) 2013  . Unspecified hypothyroidism 2009  . Unspecified vitamin D deficiency 2009  . Urinary retention 07/07/2012   Past Surgical History:  Procedure Laterality Date  . BOTOX  INJECTION N/A 10/03/2012   Procedure: BOTOX INJECTION;  Surgeon: [name], MD;  Location: [facility] ENDOSCOPY;  Service: Endoscopy;  Laterality: N/A;  . ESOPHAGOGASTRODUODENOSCOPY N/A 08/15/2012   Procedure: ESOPHAGOGASTRODUODENOSCOPY (EGD);  Surgeon: [name], MD;  Location[facility] ENDOSCOPY;  Service: Endoscopy;  Laterality: N/A;  . ESOPHAGOGASTRODUODENOSCOPY N/A 08/15/2012   Procedure: ESOPHAGOGASTRODUODENOSCOPY (EGD);  Surgeon: [name], MD;  Location: [facility];  Service: Endoscopy;  Laterality: N/A;  . ESOPHAGOGASTRODUODENOSCOPY N/A 10/03/2012   Procedure: ESOPHAGOGASTRODUODENOSCOPY (EGD);  Surgeon: [name], MD;  Location: [facility] ENDOS[facility];  Service: Endoscopy;  Laterality: N/A;  . ESOPHAGOGASTRODUODENOSCOPY N/A 12/22/2012   Procedure: ESOPHAGOGASTRODUODENOSCOPY (EGD);  Surgeon: [name], MD;  Location: [facility] ENDOSCOPY;  Service: Endoscopy;  Laterality: N/A;  . ESOPHAGOSCOPY N/A 08/15/2012   Procedure: ESOPHAGOSCOPY;  Surgeon: [name], MD;  Location: [facility];  Service: ENT;  Laterality: N/A;  . FOREIGN BODY REMOVAL ESOPHAGEAL N/A 08/15/2012   Procedure: REMOVAL FOREIGN BODY ESOPHAGEAL;  Surgeon: [name], MD;  Location: [facility];  Service: ENT;  Laterality: N/A;  . INSERT / REPLACE / REMOVE PACEMAKER    . MASTECTOMY Right 1970   Cancer  . SAVORY DILATION N/A 10/03/2012   Procedure: SAVORY DILATION;  Surgeon: [name], MD;  Location: [facility] ENDOSCOPY;  Service: Endoscopy;  Laterality: N/A;    Allergies  Allergen Reactions  . Augmentin [Amoxicillin-Pot Clavulanate] Nausea And Vomiting  . Lactose Intolerance (Gi)     Outpatient Encounter Medications as of 10/03/2017  Medication Sig  . acetaminophen (TYLENOL) 650 MG CR tablet Take 650 mg by mouth 2 (two) times daily. For pain  . escitalopram (LEXAPRO) 20 MG tablet Take 20 mg by mouth daily. For depression/ anxiety.  . fluconazole (DIFLUCAN) 150 MG tablet Take 150 mg by mouth every 3 (three) days. X 4 weeks  . ipratropium-albuterol (DUONEB)  0.5-2.5 (3) MG/3ML SOLN Take 3 mLs by nebulization every 6 (six) hours as needed (cough/wheezing).  Marland Kitchen levothyroxine (SYNTHROID, LEVOTHROID) 75 MCG tablet Take 75 mcg by mouth daily before breakfast. For thyroid therapy  . LORazepam (ATIVAN) 0.5 MG tablet Take 0.25 mg by mouth at bedtime. Give every 4 hours as needed for agitation  . losartan (COZAAR) 25 MG tablet Take 1 tablet (25 mg total) by mouth daily.  . Melatonin 5 MG TABS Take 5 tablets by mouth at bedtime.  . memantine (NAMENDA) 10 MG tablet Take 10 mg by mouth 2 (two) times daily.  . metoprolol tartrate (LOPRESSOR) 25 MG tablet Take 25 mg by mouth 2 (two) times daily.  . Multiple Vitamin (MULTIVITAMIN) tablet Take 1 tablet by mouth daily.  . Multiple Vitamins-Minerals (PRESERVISION AREDS PO) Take 1 tablet by mouth 2 (two) times daily.   . Nutritional Supplements (BOOST GLUCOSE CONTROL) LIQD Take by mouth 2 (two) times daily.  Marland Kitchen omeprazole (PRILOSEC) 20 MG capsule Take 20 mg by mouth daily.  Marland Kitchen trolamine salicylate (ASPERCREME) 10 % cream Apply 1 application topically 2 (two) times daily as needed for muscle pain.  Addison Lank Hazel 14 % LIQD Apply topically as needed (for hemorrhoid flare up).  . carboxymethylcellulose (REFRESH PLUS) 0.5 % SOLN 2  drops 2 (two) times daily.   . cetirizine (ZYRTEC) 10 MG tablet Take 10 mg by mouth daily as needed (itching).  . Dentifrices (BIOTENE DRY MOUTH CARE DT) Place 1 spray onto teeth 4 (four) times daily.  Marland Kitchen dextromethorphan-guaiFENesin (MUCINEX DM) 30-600 MG 12hr tablet Take 1 tablet by mouth 2 (two) times daily as needed.   . Docosanol (ABREVA) 10 % CREA Apply 1 application topically 5 (five) times daily. To affected area until healed and then once daily as needed  . fexofenadine (ALLEGRA) 180 MG tablet Take 180 mg by mouth daily.  Marland Kitchen liver oil-zinc oxide (DESITIN) 40 % ointment Apply 1 application topically as needed for irritation.  . [DISCONTINUED] gabapentin (NEURONTIN) 100 MG capsule Take 100 mg by  mouth 2 (two) times daily.  . [DISCONTINUED] nystatin (NYSTATIN) powder Apply topically 2 (two) times daily.   No facility-administered encounter medications on file as of 10/03/2017.     Review of Systems  Constitutional: Positive for fatigue. Negative for activity change, appetite change, chills, diaphoresis, fever and unexpected weight change.  HENT: Negative for congestion.   Respiratory: Negative for cough, shortness of breath and wheezing.   Cardiovascular: Positive for leg swelling. Negative for chest pain and palpitations.  Gastrointestinal: Negative for abdominal distention, abdominal pain, constipation and diarrhea.  Genitourinary: Negative for difficulty urinating and dysuria.  Musculoskeletal: Positive for gait problem. Negative for arthralgias, back pain, joint swelling and myalgias.  Skin: Positive for rash.  Neurological: Positive for weakness. Negative for dizziness, tremors, seizures, syncope, facial asymmetry, speech difficulty, light-headedness, numbness and headaches.  Psychiatric/Behavioral: Positive for confusion. Negative for agitation and behavioral problems.    Immunization History  Administered Date(s) Administered  . Influenza Inj Mdck Quad Pf 12/22/2015  . Influenza Whole 12/17/2012  . Influenza-Unspecified 12/08/2013, 12/16/2014, 12/27/2016  . PPD Test 07/18/2012  . Pneumococcal Conjugate-13 08/27/2015   Pertinent  Health Maintenance Due  Topic Date Due  . INFLUENZA VACCINE  10/03/2017  . PNA vac Low Risk Adult (2 of 2 - PPSV23) 02/18/2018 (Originally 08/26/2016)  . HEMOGLOBIN A1C  02/08/2018  . FOOT EXAM  06/26/2018  . OPHTHALMOLOGY EXAM  07/04/2018   Fall Risk  07/10/2017 06/27/2016 11/07/2015 01/24/2015  Falls in the past year? No No No No  Risk for fall due to : - - History of fall(s) -   Functional Status Survey:    Vitals:   10/03/17 1057  Weight: 181 lb 9.6 oz (82.4 kg)   Body mass index is 34.31 kg/m.  Wt Readings from Last 3 Encounters:    10/03/17 181 lb 9.6 oz (82.4 kg)  08/29/17 183 lb (83 kg)  07/30/17 185 lb (83.9 kg)   Physical Exam  Constitutional: No distress.  HENT:  Head: Normocephalic and atraumatic.  Mouth/Throat: No oropharyngeal exudate.  Neck: No JVD present.  Cardiovascular: Normal rate and regular rhythm.  No murmur heard. BLE edema +1  Pulmonary/Chest: Effort normal and breath sounds normal. No respiratory distress. She has no wheezes.  Abdominal: Soft. Bowel sounds are normal.  Neurological: She is alert.  Oriented x 3, forgetful of the details of her care.   Skin: Skin is warm and dry. She is not diaphoretic.  Bilateral groin erythema, worse on the right. Improved per pt  Psychiatric: She has a normal mood and affect.  Nursing note and vitals reviewed.   Labs reviewed: Recent Labs    05/06/17 0600  NA 140  K 4.7  BUN 18  CREATININE 0.5   Recent Labs  05/06/17 0600  AST 33  ALT 20  ALKPHOS 52   No results for input(s): WBC, NEUTROABS, HGB, HCT, MCV, PLT in the last 8760 hours. Lab Results  Component Value Date   TSH 2.11 08/27/2017   Lab Results  Component Value Date   HGBA1C 8.3 08/09/2017   Lab Results  Component Value Date   CHOL 135 09/19/2015   HDL 50 09/19/2015   LDLCALC 55 09/19/2015   TRIG 149 09/19/2015   CHOLHDL 4.1 07/01/2012    Significant Diagnostic Results in last 30 days:  No results found.  Assessment/Plan  1. Intertrigo Improved Continue Diflucan to complete 4 week course  2. Esophageal dysmotility Continue baby food diet Asp prec   3. Atrioventricular block, complete ([place])  Pacemaker with remote transmission in Junr 2019  4. Essential hypertension Controlled, continue losartan 25 mg qd and lopressor 25 mg bid  5. Chronic venous insufficiency Needs to be measured again for compression hose due to ill fitting hose, staff alerted  6. Vascular dementia without behavioral disturbance Continue namenda 10 mg bid  7. Hemiparesis affecting  right side as late effect of cerebrovascular accident [facility]) Continue to be able to use her scooter to go throughout the facility and the stand up lift for toileting.    Family/ staff Communication: staff  Labs/tests ordered:  NA

## 2017-11-05 DIAGNOSIS — L821 Other seborrheic keratosis: Secondary | ICD-10-CM | POA: Diagnosis not present

## 2017-11-05 DIAGNOSIS — L814 Other melanin hyperpigmentation: Secondary | ICD-10-CM | POA: Diagnosis not present

## 2017-11-05 DIAGNOSIS — L304 Erythema intertrigo: Secondary | ICD-10-CM | POA: Diagnosis not present

## 2017-11-15 ENCOUNTER — Encounter: Payer: Self-pay | Admitting: Adult Health

## 2017-11-15 ENCOUNTER — Non-Acute Institutional Stay (SKILLED_NURSING_FACILITY): Payer: Medicare Other | Admitting: Adult Health

## 2017-11-15 DIAGNOSIS — I442 Atrioventricular block, complete: Secondary | ICD-10-CM

## 2017-11-15 DIAGNOSIS — F0150 Vascular dementia without behavioral disturbance: Secondary | ICD-10-CM | POA: Diagnosis not present

## 2017-11-15 DIAGNOSIS — L304 Erythema intertrigo: Secondary | ICD-10-CM | POA: Diagnosis not present

## 2017-11-15 DIAGNOSIS — E118 Type 2 diabetes mellitus with unspecified complications: Secondary | ICD-10-CM | POA: Diagnosis not present

## 2017-11-15 DIAGNOSIS — I69351 Hemiplegia and hemiparesis following cerebral infarction affecting right dominant side: Secondary | ICD-10-CM

## 2017-11-15 DIAGNOSIS — I872 Venous insufficiency (chronic) (peripheral): Secondary | ICD-10-CM

## 2017-11-15 DIAGNOSIS — F4321 Adjustment disorder with depressed mood: Secondary | ICD-10-CM

## 2017-11-15 NOTE — Assessment & Plan Note (Signed)
Continue zeasorb, ketoconazole, and hydrocortisone Much improved Keep dry and discussed trying absorbant pads between folds to keep dry

## 2017-11-15 NOTE — Progress Notes (Signed)
This encounter was created in error - please disregard.

## 2017-11-15 NOTE — Progress Notes (Signed)
Location:  Occupational psychologist of Service:  SNF (31) Provider:   Cindi Carbon, ANP [place] [phone]   [name], DO  Patient Care Team: [name], DO as PCP - General (Geriatric Medicine) [facility], [facility] [name], Altamese Dilling, MD as Attending Physician (Gastroenterology) Garvin Fila, MD as Consulting Physician (Neurology)  Extended Emergency Contact Information Primary Emergency Contact: [place] Address: [address]          [place], [place] [address] [name] of [place] Phone: [phone] Mobile Phone: [phone] Relation: Daughter  Code Status:  DNR Goals of care: Advanced Directive information Advanced Directives 07/30/2017  Does Patient Have a Medical Advance Directive? Yes  Type of Paramedic of [place];Living will;Out of facility DNR (pink MOST or yellow form)  Does patient want to make changes to medical advance directive? No - Patient declined  Copy of [place] in Chart? Yes  Pre-existing out of facility DNR order (yellow form or pink MOST form) Yellow form placed in chart (order not valid for inpatient use);Pink MOST form placed in chart (order not valid for inpatient use)     Chief Complaint  Patient presents with  . Medical Management of Chronic Issues    HPI:  Pt is a 84 y.o. female seen today for medical management of chronic diseases.    Intertrigo: seen by derm and hydrocortisone/ketoconazole regimen ordered with zeasorb powder. Much improved. Previously given Diflucan as well.  Has large pannus, hx of DM II, as well as incontinence which contributes to her risk  Vascular dementia: declining MMSE score from 23 to 21 in August of 2019. Currently on namenda. No behaviors  Depression: no issues with crying of feelings of sadness, can be anxious at times or become frustrated with her current health situation  AV block s/p  pacemaker with remote transmission June of 2019  DM II: not on meds Lab Results  Component Value Date   HGBA1C 8.3 08/09/2017   Hx of CVA in 2014 with right sided weakness: uses a motorized scooter    Functional status:lift for transfers, intermittently incontinent Past Medical History:  Diagnosis Date  . Abnormality of gait 2007  . Acquired cyst of kidney 2002  . Acute sinus infection 11/25/2012  . Allergic rhinitis due to pollen   . Altered mental status 11/28/2012  . Anxiety state, unspecified   . Atrioventricular block, complete ([place]) 2003   s/p PPM 2003, generator chnage 2011  . Cardiac pacemaker in situ 2003  . Coronary atherosclerosis of native coronary artery 2003   non obstructive by Cath 2003  . CVA (cerebral infarction) 06/28/2012   rt hemiparesis, dysphagia  . Dendritic keratitis 2012  . Dysuria   . Edema 2003  . Esophageal dysmotility 08/19/2012  . Food impaction of esophagus 08/15/2012  . HTN (hypertension) 06/28/2012  . Hypertension   . Macular degeneration (senile) of retina, unspecified 2012  . Malignant neoplasm of breast (female), unspecified site 1970   S/P Rt radical mastectomy  . NSTEMI (non-ST elevated myocardial infarction) ([place]) 06/28/2012  . Osteoarthrosis, unspecified whether generalized or localized, unspecified site 2013  . Pacemaker   . Personal history of colonic polyps     s/p colonoscopy/polypectomy 2003  . Pure hyperglyceridemia 2004  . Senile osteoporosis 2003  . Stroke ([place]) 06/28/2012  . Tinnitus 2007  . Type II or unspecified type diabetes mellitus without mention of complication, not stated as uncontrolled 2003  .  Unspecified arthropathy, pelvic region and thigh 2010  . Unspecified glaucoma(365.9) 2013  . Unspecified hypothyroidism 2009  . Unspecified vitamin D deficiency 2009  . Urinary retention 07/07/2012   Past Surgical History:  Procedure Laterality Date  . BOTOX INJECTION N/A 10/03/2012   Procedure: BOTOX INJECTION;  Surgeon:  [name], MD;  Location: [facility] ENDOSCOPY;  Service: Endoscopy;  Laterality: N/A;  . ESOPHAGOGASTRODUODENOSCOPY N/A 08/15/2012   Procedure: ESOPHAGOGASTRODUODENOSCOPY (EGD);  Surgeon: [name], MD;  Location[facility] ENDOSCOPY;  Service: Endoscopy;  Laterality: N/A;  . ESOPHAGOGASTRODUODENOSCOPY N/A 08/15/2012   Procedure: ESOPHAGOGASTRODUODENOSCOPY (EGD);  Surgeon: [name], MD;  Location: [facility];  Service: Endoscopy;  Laterality: N/A;  . ESOPHAGOGASTRODUODENOSCOPY N/A 10/03/2012   Procedure: ESOPHAGOGASTRODUODENOSCOPY (EGD);  Surgeon: [name], MD;  Location: [facility];  Service: Endoscopy;  Laterality: N/A;  . ESOPHAGOGASTRODUODENOSCOPY N/A 12/22/2012   Procedure: ESOPHAGOGASTRODUODENOSCOPY (EGD);  Surgeon: [name], MD;  Location: [facility] ENDOSCOPY;  Service: Endoscopy;  Laterality: N/A;  . ESOPHAGOSCOPY N/A 08/15/2012   Procedure: ESOPHAGOSCOPY;  Surgeon: [name], MD;  Location: [facility];  Service: ENT;  Laterality: N/A;  . FOREIGN BODY REMOVAL ESOPHAGEAL N/A 08/15/2012   Procedure: REMOVAL FOREIGN BODY ESOPHAGEAL;  Surgeon: [name], MD;  Location: [place];  Service: ENT;  Laterality: N/A;  . INSERT / REPLACE / REMOVE PACEMAKER    . MASTECTOMY Right 1970   Cancer  . SAVORY DILATION N/A 10/03/2012   Procedure: SAVORY DILATION;  Surgeon: [name], MD;  Location: [facility] ENDOSCOPY;  Service: Endoscopy;  Laterality: N/A;    Allergies  Allergen Reactions  . Augmentin [Amoxicillin-Pot Clavulanate] Nausea And Vomiting  . Lactose Intolerance (Gi)     Outpatient Encounter Medications as of 11/15/2017  Medication Sig  . acetaminophen (TYLENOL) 650 MG CR tablet Take 650 mg by mouth 2 (two) times daily. For pain  . carboxymethylcellulose (REFRESH PLUS) 0.5 % SOLN 2 drops 2 (two) times daily.   . cetirizine (ZYRTEC) 10 MG tablet Take 10 mg by mouth daily as needed (itching).  . Dentifrices (BIOTENE DRY MOUTH CARE DT) Place 1 spray onto teeth 4 (four) times daily.  Marland Kitchen  dextromethorphan-guaiFENesin (MUCINEX DM) 30-600 MG 12hr tablet Take 1 tablet by mouth 2 (two) times daily as needed.   . Docosanol (ABREVA) 10 % CREA Apply 1 application topically 5 (five) times daily. To affected area until healed and then once daily as needed  . escitalopram (LEXAPRO) 20 MG tablet Take 20 mg by mouth daily. For depression/ anxiety.  . fexofenadine (ALLEGRA) 180 MG tablet Take 180 mg by mouth daily.  . fluconazole (DIFLUCAN) 150 MG tablet Take 150 mg by mouth every 3 (three) days. X 4 weeks  . ipratropium-albuterol (DUONEB) 0.5-2.5 (3) MG/3ML SOLN Take 3 mLs by nebulization every 6 (six) hours as needed (cough/wheezing).  Marland Kitchen levothyroxine (SYNTHROID, LEVOTHROID) 75 MCG tablet Take 75 mcg by mouth daily before breakfast. For thyroid therapy  . liver oil-zinc oxide (DESITIN) 40 % ointment Apply 1 application topically as needed for irritation.  Marland Kitchen LORazepam (ATIVAN) 0.5 MG tablet Take 0.25 mg by mouth at bedtime. Give every 4 hours as needed for agitation  . losartan (COZAAR) 25 MG tablet Take 1 tablet (25 mg total) by mouth daily.  . Melatonin 5 MG TABS Take 5 tablets by mouth at bedtime.  . memantine (NAMENDA) 10 MG tablet Take 10 mg by mouth 2 (two) times daily.  . metoprolol tartrate (LOPRESSOR) 25 MG tablet Take 25 mg  by mouth 2 (two) times daily.  . Multiple Vitamin (MULTIVITAMIN) tablet Take 1 tablet by mouth daily.  . Multiple Vitamins-Minerals (PRESERVISION AREDS PO) Take 1 tablet by mouth 2 (two) times daily.   . Nutritional Supplements (BOOST GLUCOSE CONTROL) LIQD Take by mouth 2 (two) times daily.  Marland Kitchen omeprazole (PRILOSEC) 20 MG capsule Take 20 mg by mouth daily.  Marland Kitchen trolamine salicylate (ASPERCREME) 10 % cream Apply 1 application topically 2 (two) times daily as needed for muscle pain.  Addison Lank Hazel 14 % LIQD Apply topically as needed (for hemorrhoid flare up).   No facility-administered encounter medications on file as of 11/15/2017.     Review of Systems    Constitutional: Negative for activity change, appetite change, chills, diaphoresis, fatigue, fever and unexpected weight change.  HENT: Negative for congestion.   Respiratory: Negative for cough, shortness of breath and wheezing.   Cardiovascular: Positive for leg swelling. Negative for chest pain and palpitations.  Gastrointestinal: Negative for abdominal distention, abdominal pain, constipation and diarrhea.  Genitourinary: Negative for difficulty urinating and dysuria.  Musculoskeletal: Positive for gait problem. Negative for arthralgias, back pain, joint swelling and myalgias.  Neurological: Positive for weakness. Negative for dizziness, tremors, seizures, syncope, facial asymmetry, speech difficulty, light-headedness, numbness and headaches.  Psychiatric/Behavioral: Positive for confusion. Negative for agitation and behavioral problems.    Immunization History  Administered Date(s) Administered  . Influenza Inj Mdck Quad Pf 12/22/2015  . Influenza Whole 12/17/2012  . Influenza-Unspecified 12/08/2013, 12/16/2014, 12/27/2016  . PPD Test 07/18/2012  . Pneumococcal Conjugate-13 08/27/2015   Pertinent  Health Maintenance Due  Topic Date Due  . INFLUENZA VACCINE  10/03/2017  . PNA vac Low Risk Adult (2 of 2 - PPSV23) 02/18/2018 (Originally 08/26/2016)  . HEMOGLOBIN A1C  02/08/2018  . FOOT EXAM  06/26/2018  . OPHTHALMOLOGY EXAM  07/04/2018   Fall Risk  07/10/2017 06/27/2016 11/07/2015 01/24/2015  Falls in the past year? No No No No  Risk for fall due to : - - History of fall(s) -   Functional Status Survey:    Vitals:   11/15/17 1012  Weight: 180 lb 1.6 oz (81.7 kg)   Body mass index is 34.03 kg/m. Physical Exam  Constitutional: She is oriented to person, place, and time. No distress.  HENT:  Head: Normocephalic and atraumatic.  Neck: No JVD present.  Cardiovascular: Normal rate and regular rhythm.  No murmur heard. Pulmonary/Chest: Effort normal and breath sounds normal. No  respiratory distress. She has no wheezes.  Abdominal: Soft. Bowel sounds are normal.  Neurological: She is alert and oriented to person, place, and time.  Skin: Skin is warm and dry. She is not diaphoretic.  Improved area to bilateral groin with minimal redness Moisture noted to pannus and groin  Psychiatric: She has a normal mood and affect.  Nursing note and vitals reviewed.   Labs reviewed: Recent Labs    05/06/17 0600  NA 140  K 4.7  BUN 18  CREATININE 0.5   Recent Labs    05/06/17 0600  AST 33  ALT 20  ALKPHOS 52   No results for input(s): WBC, NEUTROABS, HGB, HCT, MCV, PLT in the last 8760 hours. Lab Results  Component Value Date   TSH 2.11 08/27/2017   Lab Results  Component Value Date   HGBA1C 8.3 08/09/2017   Lab Results  Component Value Date   CHOL 135 09/19/2015   HDL 50 09/19/2015   LDLCALC 55 09/19/2015   TRIG 149 09/19/2015  CHOLHDL 4.1 07/01/2012    Significant Diagnostic Results in last 30 days:  No results found.  Assessment/Plan  1. Vascular dementia without behavioral disturbance Continue namenda  2. Intertrigo Improved. Continue current therapy. Discussed using absorbant pads between pannus to prevent future issues.  3. Atrioventricular block, complete ([place]) S/p pacemaker  4. Chronic venous insufficiency Continue compression hose  5. Controlled type 2 diabetes mellitus with complication, without long-term current use of insulin (HCC) Above goal at 8.3 % but given her age and functional status and goals of care, will hold off treatment  6. Hemiparesis affecting right side as late effect of cerebrovascular accident [facility]) Due to CVA in 2014  7. Situational depression Continue lexapro 20 mg qd    Family/ staff Communication: staff/resident  Labs/tests ordered:  NA

## 2017-11-15 NOTE — Addendum Note (Signed)
Addended by: Barnie Mort on: 11/15/2017 11:29 AM   Modules accepted: Level of Service, SmartSet

## 2017-11-26 ENCOUNTER — Ambulatory Visit (INDEPENDENT_AMBULATORY_CARE_PROVIDER_SITE_OTHER): Payer: Medicare Other | Admitting: *Deleted

## 2017-11-26 ENCOUNTER — Telehealth: Payer: Self-pay

## 2017-11-26 DIAGNOSIS — I442 Atrioventricular block, complete: Secondary | ICD-10-CM | POA: Diagnosis not present

## 2017-11-26 NOTE — Telephone Encounter (Signed)
Confirmed remote transmission w/ pt son in law.

## 2017-11-27 ENCOUNTER — Encounter: Payer: Self-pay | Admitting: Cardiology

## 2017-11-27 NOTE — Progress Notes (Signed)
Remote pacemaker transmission.   

## 2017-12-04 LAB — CUP PACEART REMOTE DEVICE CHECK
Brady Statistic AP VP Percent: 0 %
Brady Statistic AS VP Percent: 99 %
Brady Statistic AS VS Percent: 0 %
Implantable Lead Implant Date: 20110506
Implantable Lead Implant Date: 20110506
Implantable Lead Location: 753859
Implantable Lead Location: 753860
Implantable Lead Model: 5076
Implantable Pulse Generator Implant Date: 20110506
Lead Channel Impedance Value: 416 Ohm
Lead Channel Impedance Value: 481 Ohm
Lead Channel Pacing Threshold Amplitude: 0.875 V
Lead Channel Setting Pacing Amplitude: 2 V
MDC IDC MSMT BATTERY IMPEDANCE: 1273 Ohm
MDC IDC MSMT BATTERY REMAINING LONGEVITY: 46 mo
MDC IDC MSMT BATTERY VOLTAGE: 2.77 V
MDC IDC MSMT LEADCHNL RA PACING THRESHOLD AMPLITUDE: 0.75 V
MDC IDC MSMT LEADCHNL RA PACING THRESHOLD PULSEWIDTH: 0.4 ms
MDC IDC MSMT LEADCHNL RV PACING THRESHOLD PULSEWIDTH: 0.4 ms
MDC IDC SESS DTM: 20190924181920
MDC IDC SET LEADCHNL RV PACING AMPLITUDE: 2.5 V
MDC IDC SET LEADCHNL RV PACING PULSEWIDTH: 0.4 ms
MDC IDC SET LEADCHNL RV SENSING SENSITIVITY: 2.8 mV
MDC IDC STAT BRADY AP VS PERCENT: 0 %

## 2017-12-13 ENCOUNTER — Non-Acute Institutional Stay: Payer: Medicare Other | Admitting: Internal Medicine

## 2017-12-16 ENCOUNTER — Non-Acute Institutional Stay: Payer: Medicare Other | Admitting: Internal Medicine

## 2017-12-16 DIAGNOSIS — Z515 Encounter for palliative care: Secondary | ICD-10-CM

## 2017-12-16 DIAGNOSIS — F411 Generalized anxiety disorder: Secondary | ICD-10-CM

## 2017-12-17 NOTE — Progress Notes (Signed)
PALLIATIVE CARE CONSULT VISIT   PATIENT NAME: Rachel Cooke DOB: 01/23/1920 MRN: [id number]  PRIMARY CARE PROVIDER:   [name], DO  REFERRING PROVIDER:  [name], DO [place], [place] [address]  RESPONSIBLE PARTY: (dtr) Verlon Au [name]). Address: [address], [place], [place] [address]  Home Phone: [phone], Mobile Phone: [phone]  IMPRESSION / RECOMMENDATIONS:  1. Anxiety; situational and constitutional             A. Reviewed stress reduction techniques that she could utilize, some which have been successful for her in the past (painting, exercises, focused and goal oriented conversations with family members, letter witting)             [place] consult for supportive counseling/stress reduction/life review. 2. Advanced Care planning: DNR/MOST form in place. Specifics reviewed and confirmed with patient and son-in-law.  Patient had a PM; last battery change out approximately 8 years ago. Family leaning towards not having the battery changed out should checks reveal battery nearing end-of-life.  3. Follow up:             A. PC NP visit in 1-2 months.   I spent 55 minutes providing this consultation, from 2pm to 2:55pm. More than 50% of the time in this consultation was spent coordinating communication.   HPI: 84 yo female with history significant for vascular dementia (MMSE 23 Oct 2017), depression, AV block (PM), CVA (right sided weakness. motorized scooter) DM (not on meds), and  intertrigo. Referred to Palliative Care for assistance with symptom management, help with coordination of resources, and ongoing discussions regarding goals of care/advanced directives  CODE STATUS: DNR, MOST (DNR, Comfort Measures, No Antibiotice, No IVFs, No Tube Feedings).   REVIEW OF SYSTEMS: Patient's PPS is 30%. She is dependent for transfers, and for assist with hygiene and dressing. All transfers are with a sit/lift to stand device with 2  staff assist. She is non-ambulatory. She is continent of bowel and bladder. She motors about in her power wheelchair. She has mild right sided weakness.  Staff report occasional mild agitation and anxiety if her routine is varied, or when new staff introduced. They note this has progressed over the last 5 months. Patient is forgetful of day to day events, but consistently remembers her family and most staff members. She has h/o esophageal obstruction r/t dysmotility from stroke. Her diet was recently liberated to Dysphagia 1 with smooth puree (from baby food consistency only) a few days ago and she is consuming 75% of this without signs dysphagia. She has diet-controlled DM with blood sugars check once to twice a month. She has had a R mastectomy with residual right UE mild swelling. Bilateral LE edema dependent in nature. She has mild arthritic type complaints various joints managed with Tylenol.   HOSPICE ELIGIBILITY: No, as prognosis thought greater than 6 months.   PAST MEDICAL HISTORY:  Past Medical History:  Diagnosis Date  . Abnormality of gait 2007  . Acquired cyst of kidney 2002  . Acute sinus infection 11/25/2012  . Allergic rhinitis due to pollen   . Altered mental status 11/28/2012  . Anxiety state, unspecified   . Atrioventricular block, complete ([place]) 2003   s/p PPM 2003, generator chnage 2011  . Cardiac pacemaker in situ 2003  . Coronary atherosclerosis of native coronary artery 2003   non obstructive by Cath 2003  . CVA (cerebral infarction) 06/28/2012   rt hemiparesis, dysphagia  . Dendritic keratitis  2012  . Dysuria   . Edema 2003  . Esophageal dysmotility 08/19/2012  . Food impaction of esophagus 08/15/2012  . HTN (hypertension) 06/28/2012  . Hypertension   . Macular degeneration (senile) of retina, unspecified 2012  . Malignant neoplasm of breast (female), unspecified site 1970   S/P Rt radical mastectomy  . NSTEMI (non-ST elevated myocardial infarction) ([place]) 06/28/2012    . Osteoarthrosis, unspecified whether generalized or localized, unspecified site 2013  . Pacemaker   . Personal history of colonic polyps     s/p colonoscopy/polypectomy 2003  . Pure hyperglyceridemia 2004  . Senile osteoporosis 2003  . Stroke ([place]) 06/28/2012  . Tinnitus 2007  . Type II or unspecified type diabetes mellitus without mention of complication, not stated as uncontrolled 2003  . Unspecified arthropathy, pelvic region and thigh 2010  . Unspecified glaucoma(365.9) 2013  . Unspecified hypothyroidism 2009  . Unspecified vitamin D deficiency 2009  . Urinary retention 07/07/2012    SOCIAL HX:  Social History   Tobacco Use  . Smoking status: Former Smoker    Packs/day: 0.25    Years: 3.00    Pack years: 0.75  . Smokeless tobacco: Never Used  Substance Use Topics  . Alcohol use: No    ALLERGIES:  Allergies  Allergen Reactions  . Augmentin [Amoxicillin-Pot Clavulanate] Nausea And Vomiting  . Lactose Intolerance (Gi)      PERTINENT MEDICATIONS:  Outpatient Encounter Medications as of 12/16/2017  Medication Sig  . acetaminophen (TYLENOL) 650 MG CR tablet Take 650 mg by mouth 2 (two) times daily. For pain  . carboxymethylcellulose (REFRESH PLUS) 0.5 % SOLN 2 drops 2 (two) times daily.   . cetirizine (ZYRTEC) 10 MG tablet Take 10 mg by mouth daily as needed (itching).  . Dentifrices (BIOTENE DRY MOUTH CARE DT) Place 1 spray onto teeth 4 (four) times daily.  Marland Kitchen dextromethorphan-guaiFENesin (MUCINEX DM) 30-600 MG 12hr tablet Take 1 tablet by mouth 2 (two) times daily as needed.   . Docosanol (ABREVA) 10 % CREA Apply 1 application topically 5 (five) times daily. To affected area until healed and then once daily as needed  . escitalopram (LEXAPRO) 20 MG tablet Take 20 mg by mouth daily. For depression/ anxiety.  . fexofenadine (ALLEGRA) 180 MG tablet Take 180 mg by mouth daily.  . hydrocortisone 2.5 % cream Apply topically 2 (two) times daily.  Marland Kitchen ipratropium-albuterol (DUONEB)  0.5-2.5 (3) MG/3ML SOLN Take 3 mLs by nebulization every 6 (six) hours as needed (cough/wheezing).  Marland Kitchen ketoconazole (NIZORAL) 2 % cream Apply 1 application topically 2 (two) times daily.  Marland Kitchen levothyroxine (SYNTHROID, LEVOTHROID) 75 MCG tablet Take 75 mcg by mouth daily before breakfast. For thyroid therapy  . liver oil-zinc oxide (DESITIN) 40 % ointment Apply 1 application topically as needed for irritation.  Marland Kitchen LORazepam (ATIVAN) 0.5 MG tablet Take 0.25 mg by mouth at bedtime. Give every 4 hours as needed for agitation  . losartan (COZAAR) 25 MG tablet Take 1 tablet (25 mg total) by mouth daily.  . Melatonin 5 MG TABS Take 5 tablets by mouth at bedtime.  . memantine (NAMENDA) 10 MG tablet Take 10 mg by mouth 2 (two) times daily.  . metoprolol tartrate (LOPRESSOR) 25 MG tablet Take 25 mg by mouth 2 (two) times daily.  . Multiple Vitamin (MULTIVITAMIN) tablet Take 1 tablet by mouth daily.  . Multiple Vitamins-Minerals (PRESERVISION AREDS PO) Take 1 tablet by mouth 2 (two) times daily.   . Nutritional Supplements (BOOST GLUCOSE CONTROL) LIQD Take  by mouth 2 (two) times daily.  Marland Kitchen omeprazole (PRILOSEC) 20 MG capsule Take 20 mg by mouth daily.  Marland Kitchen talc (ZEASORB) powder Apply 1 application topically daily.  Marland Kitchen trolamine salicylate (ASPERCREME) 10 % cream Apply 1 application topically 2 (two) times daily as needed for muscle pain.  Addison Lank Hazel 14 % LIQD Apply topically as needed (for hemorrhoid flare up).   No facility-administered encounter medications on file as of 12/16/2017.     PHYSICAL EXAM:  Pleasantly conversant elderly female sitting in recliner with LEs elevated. Her S-I-L is visiting.  General: Well nourished, NAD Cardiovascular: regular rate and rhythm without MRG Pulmonary: clear ant fields Abdomen: soft, nontender, + bowel sounds Extremities: bilateral LE edema softly pitting to mid ankle, no joint deformities Skin: exposed areas without rashes Neurological: Weakness but otherwise  nonfocal  Julianne Handler, NP

## 2017-12-18 ENCOUNTER — Telehealth: Payer: Self-pay | Admitting: Internal Medicine

## 2017-12-18 NOTE — Telephone Encounter (Signed)
11:15am TC to patient's daughter Heywood Iles to discuss recent PC visit 12/16/2017. Katharine Look mentioned patient's [employer] background, and that patient was not able to establish a church home when patient relocated to [place]. Katharine Look is going to try to arrange for a church community outreach contact to visit her mom. We discussed the manifestations of patient's underlying dementia, and how that might look as disease progresses. I mentioned that I had requested PC SW follow up. We confirmed family / patient wishes for DNR and no plans for aggressive medical interventions should PM battery reach EOL. Violeta Gelinas NP-C

## 2017-12-24 ENCOUNTER — Non-Acute Institutional Stay (SKILLED_NURSING_FACILITY): Payer: Medicare Other | Admitting: Internal Medicine

## 2017-12-24 ENCOUNTER — Encounter: Payer: Self-pay | Admitting: Internal Medicine

## 2017-12-24 DIAGNOSIS — K224 Dyskinesia of esophagus: Secondary | ICD-10-CM

## 2017-12-24 DIAGNOSIS — F0150 Vascular dementia without behavioral disturbance: Secondary | ICD-10-CM

## 2017-12-24 DIAGNOSIS — E039 Hypothyroidism, unspecified: Secondary | ICD-10-CM

## 2017-12-24 DIAGNOSIS — F339 Major depressive disorder, recurrent, unspecified: Secondary | ICD-10-CM

## 2017-12-24 DIAGNOSIS — E118 Type 2 diabetes mellitus with unspecified complications: Secondary | ICD-10-CM

## 2017-12-24 NOTE — Progress Notes (Signed)
Location:   [facility]   Place of Service:   SNF Provider:  Brendaliz Kuk L. Mariea Clonts, D.O., C.M.D.  [name], DO  Patient Care Team: [name], DO as PCP - General (Geriatric Medicine) [facility], [name], Altamese Dilling, MD as Attending Physician (Gastroenterology) Garvin Fila, MD as Consulting Physician (Neurology)  Extended Emergency Contact Information Primary Emergency Contact: [facility]anger Address: [address]          [place], [place] [address] [name] of [place] Phone: [phone] Mobile Phone: [phone] Relation: Daughter  Code Status:  DNR, MOST, palliative care Goals of care: Advanced Directive information Advanced Directives 07/30/2017  Does Patient Have a Medical Advance Directive? Yes  Type of Paramedic of [place];Living will;Out of facility DNR (pink MOST or yellow form)  Does patient want to make changes to medical advance directive? No - Patient declined  Copy of [place] in Chart? Yes  Pre-existing out of facility DNR order (yellow form or pink MOST form) Yellow form placed in chart (order not valid for inpatient use);Pink MOST form placed in chart (order not valid for inpatient use)     Chief Complaint  Patient presents with  . Medical Management of Chronic Issues    med mgt of chronic diseases    HPI:  Pt is a 84 y.o. female with prior stroke with right hemiparesis, vascular dementia, depression, hypothyroidism, seen today for medical management of chronic diseases.    Ms. Penland memory has been declining more especially the past few months.  She has a long history of depression even in younger life.  She has been more forgetful with nursing--forgets she's already received her evening meds and asks for them again.  Her family has requested she be followed by palliative care.  They initially requested hospice, but she did not qualify at this point.    Sat, she ran her  wheelchair into her dresser and got a bruise on her foot-discolored.    She's had her diet changed from the baby food specially purchased to soft puree from here in hopes she'll enjoy it more.  She has not had any recent rashes or skin changes since her shingles and candidal intertrigo resolved.    When seen, she was ecstatic about her new diet.  Enjoys going to the dining room again--reports she would not be upset if she never sees babyfood peas again in her life.    No pain.  Bowels moving.    Weight had trended up until May and then has declined.  bmi remains in obese range.  Pt now eating better since she likes her new diet more.   Past Medical History:  Diagnosis Date  . Abnormality of gait 2007  . Acquired cyst of kidney 2002  . Acute sinus infection 11/25/2012  . Allergic rhinitis due to pollen   . Altered mental status 11/28/2012  . Anxiety state, unspecified   . Atrioventricular block, complete ([place]) 2003   s/p PPM 2003, generator chnage 2011  . Cardiac pacemaker in situ 2003  . Coronary atherosclerosis of native coronary artery 2003   non obstructive by Cath 2003  . CVA (cerebral infarction) 06/28/2012   rt hemiparesis, dysphagia  . Dendritic keratitis 2012  . Dysuria   . Edema 2003  . Esophageal dysmotility 08/19/2012  . Food impaction of esophagus 08/15/2012  . HTN (hypertension) 06/28/2012  . Hypertension   . Macular degeneration (senile) of retina, unspecified 2012  . Malignant  neoplasm of breast (female), unspecified site 1970   S/P Rt radical mastectomy  . NSTEMI (non-ST elevated myocardial infarction) ([place]) 06/28/2012  . Osteoarthrosis, unspecified whether generalized or localized, unspecified site 2013  . Pacemaker   . Personal history of colonic polyps     s/p colonoscopy/polypectomy 2003  . Pure hyperglyceridemia 2004  . Senile osteoporosis 2003  . Stroke ([place]) 06/28/2012  . Tinnitus 2007  . Type II or unspecified type diabetes mellitus without mention of  complication, not stated as uncontrolled 2003  . Unspecified arthropathy, pelvic region and thigh 2010  . Unspecified glaucoma(365.9) 2013  . Unspecified hypothyroidism 2009  . Unspecified vitamin D deficiency 2009  . Urinary retention 07/07/2012   Past Surgical History:  Procedure Laterality Date  . BOTOX INJECTION N/A 10/03/2012   Procedure: BOTOX INJECTION;  Surgeon: [name], MD;  Location: [facility];  Service: Endoscopy;  Laterality: N/A;  . ESOPHAGOGASTRODUODENOSCOPY N/A 08/15/2012   Procedure: ESOPHAGOGASTRODUODENOSCOPY (EGD);  Surgeon: [name], MD;  Location: [facility] ENDOSCOPY;  Service: Endoscopy;  Laterality: N/A;  . ESOPHAGOGASTRODUODENOSCOPY N/A 08/15/2012   Procedure: ESOPHAGOGASTRODUODENOSCOPY (EGD);  Surgeon: [name], MD;  Location: [facility];  Service: Endoscopy;  Laterality: N/A;  . ESOPHAGOGASTRODUODENOSCOPY N/A 10/03/2012   Procedure: ESOPHAGOGASTRODUODENOSCOPY (EGD);  Surgeon: [name], MD;  Location: [facility];  Service: Endoscopy;  Laterality: N/A;  . ESOPHAGOGASTRODUODENOSCOPY N/A 12/22/2012   Procedure: ESOPHAGOGASTRODUODENOSCOPY (EGD);  Surgeon: [name], MD;  Location: [facility] ENDOSCOPY;  Service: Endoscopy;  Laterality: N/A;  . ESOPHAGOSCOPY N/A 08/15/2012   Procedure: ESOPHAGOSCOPY;  Surgeon: [name], MD;  Location: [facility];  Service: ENT;  Laterality: N/A;  . FOREIGN BODY REMOVAL ESOPHAGEAL N/A 08/15/2012   Procedure: REMOVAL FOREIGN BODY ESOPHAGEAL;  Surgeon: [name], MD;  Location: [facility];  Service: ENT;  Laterality: N/A;  . INSERT / REPLACE / REMOVE PACEMAKER    . MASTECTOMY Right 1970   Cancer  . SAVORY DILATION N/A 10/03/2012   Procedure: SAVORY DILATION;  Surgeon: [name], MD;  [facility];  Service: Endoscopy;  Laterality: N/A;    Allergies  Allergen Reactions  . Augmentin [Amoxicillin-Pot Clavulanate] Nausea And Vomiting  . Lactose Intolerance (Gi)     Outpatient Encounter Medications as of 12/24/2017  Medication Sig  .  acetaminophen (TYLENOL) 650 MG CR tablet Take 650 mg by mouth 2 (two) times daily. For pain  . carboxymethylcellulose (REFRESH PLUS) 0.5 % SOLN 2 drops 2 (two) times daily.   . cetirizine (ZYRTEC) 10 MG tablet Take 10 mg by mouth daily as needed (itching).  . Dentifrices (BIOTENE DRY MOUTH CARE DT) Place 1 spray onto teeth 4 (four) times daily.  Marland Kitchen dextromethorphan-guaiFENesin (MUCINEX DM) 30-600 MG 12hr tablet Take 1 tablet by mouth 2 (two) times daily as needed.   . Docosanol (ABREVA) 10 % CREA Apply 1 application topically 5 (five) times daily. To affected area until healed and then once daily as needed  . escitalopram (LEXAPRO) 20 MG tablet Take 20 mg by mouth daily. For depression/ anxiety.  . fexofenadine (ALLEGRA) 180 MG tablet Take 180 mg by mouth daily.  . hydrocortisone 2.5 % cream Apply topically 2 (two) times daily.  Marland Kitchen ipratropium-albuterol (DUONEB) 0.5-2.5 (3) MG/3ML SOLN Take 3 mLs by nebulization every 6 (six) hours as needed (cough/wheezing).  Marland Kitchen ketoconazole (NIZORAL) 2 % cream Apply 1 application topically 2 (two) times daily.  Marland Kitchen levothyroxine (SYNTHROID, LEVOTHROID) 75 MCG tablet Take 75 mcg by mouth daily before breakfast.  For thyroid therapy  . liver oil-zinc oxide (DESITIN) 40 % ointment Apply 1 application topically as needed for irritation.  Marland Kitchen LORazepam (ATIVAN) 0.5 MG tablet Take 0.25 mg by mouth at bedtime. Give every 4 hours as needed for agitation  . losartan (COZAAR) 25 MG tablet Take 1 tablet (25 mg total) by mouth daily.  . Melatonin 5 MG TABS Take 5 tablets by mouth at bedtime.  . memantine (NAMENDA) 10 MG tablet Take 10 mg by mouth 2 (two) times daily.  . metoprolol tartrate (LOPRESSOR) 25 MG tablet Take 25 mg by mouth 2 (two) times daily.  . Multiple Vitamin (MULTIVITAMIN) tablet Take 1 tablet by mouth daily.  . Multiple Vitamins-Minerals (PRESERVISION AREDS PO) Take 1 tablet by mouth 2 (two) times daily.   . Nutritional Supplements (BOOST GLUCOSE CONTROL) LIQD  Take by mouth 2 (two) times daily.  Marland Kitchen omeprazole (PRILOSEC) 20 MG capsule Take 20 mg by mouth daily.  Marland Kitchen talc (ZEASORB) powder Apply 1 application topically daily.  Marland Kitchen trolamine salicylate (ASPERCREME) 10 % cream Apply 1 application topically 2 (two) times daily as needed for muscle pain.  Addison Lank Hazel 14 % LIQD Apply topically as needed (for hemorrhoid flare up).   No facility-administered encounter medications on file as of 12/24/2017.     Review of Systems  Constitutional: Positive for malaise/fatigue. Negative for chills and fever.  HENT: Negative for congestion.   Eyes: Negative for blurred vision.  Respiratory: Negative for cough and shortness of breath.   Cardiovascular: Positive for leg swelling. Negative for chest pain and palpitations.  Gastrointestinal: Negative for abdominal pain, blood in stool, constipation, diarrhea and melena.  Genitourinary: Negative for dysuria.  Musculoskeletal: Negative for falls and joint pain.  Neurological: Positive for sensory change and focal weakness. Negative for dizziness and loss of consciousness.  Psychiatric/Behavioral: Positive for depression and memory loss. The patient is not nervous/anxious and does not have insomnia.     Immunization History  Administered Date(s) Administered  . Influenza Inj Mdck Quad Pf 12/22/2015  . Influenza Whole 12/17/2012  . Influenza-Unspecified 12/08/2013, 12/16/2014, 12/27/2016  . PPD Test 07/18/2012  . Pneumococcal Conjugate-13 08/27/2015   Pertinent  Health Maintenance Due  Topic Date Due  . INFLUENZA VACCINE  10/03/2017  . PNA vac Low Risk Adult (2 of 2 - PPSV23) 02/18/2018 (Originally 08/26/2016)  . HEMOGLOBIN A1C  02/08/2018  . FOOT EXAM  06/26/2018  . OPHTHALMOLOGY EXAM  07/04/2018   Fall Risk  07/10/2017 06/27/2016 11/07/2015 01/24/2015  Falls in the past year? No No No No  Risk for fall due to : - - History of fall(s) -   Functional Status Survey:    There were no vitals filed for this  visit. There is no height or weight on file to calculate BMI. Physical Exam  Constitutional: She appears well-developed and well-nourished. No distress.  Cardiovascular: Normal rate, regular rhythm, normal heart sounds and intact distal pulses.  Pulmonary/Chest: Effort normal and breath sounds normal. No respiratory distress.  Abdominal: Bowel sounds are normal.  Musculoskeletal:  Right hemiparesis  Neurological: She is alert.  Oriented to person, place, not time  Skin: Skin is warm and dry.  Discoloration of foot but no longer tender (from running wheelchair into her dresser)  Psychiatric: She has a normal mood and affect.    Labs reviewed: Recent Labs    05/06/17 0600  NA 140  K 4.7  BUN 18  CREATININE 0.5   Recent Labs    05/06/17 0600  AST 33  ALT 20  ALKPHOS 52   No results for input(s): WBC, NEUTROABS, HGB, HCT, MCV, PLT in the last 8760 hours. Lab Results  Component Value Date   TSH 2.11 08/27/2017   Lab Results  Component Value Date   HGBA1C 8.3 08/09/2017   Lab Results  Component Value Date   CHOL 135 09/19/2015   HDL 50 09/19/2015   LDLCALC 55 09/19/2015   TRIG 149 09/19/2015   CHOLHDL 4.1 07/01/2012    Assessment/Plan 1. Esophageal dysmotility -cont soft puree diet upgrade as requested by resident, aspiration precautions, comfort care  2. Controlled type 2 diabetes mellitus with complication, without long-term current use of insulin (HCC) -last hba1c elevated, but pt goals are comfort at this time, she's receiving palliative care services and just had her diet liberalized somewhat  3. Hypothyroidism, unspecified type -cont current levothyroxine  4. Vascular dementia without behavioral disturbance (HCC) -progressive, last mmse was 21/30--she passed the clock  5. Depression, recurrent ([place]) -cont chronic lexapro therapy and now getting some counseling from the palliative care team  Family/ staff Communication: discussed with snf  nurse  Labs/tests ordered:  No new   Arpan Eskelson L. Aksh Swart, D.O. [facility] [address], [place] [address] Cell Phone (Mon-Fri 8am-5pm):  [phone] On Call:  [phone] & follow prompts after 5pm & weekends Office Phone:  [phone] Office Fax:  [phone]

## 2017-12-31 DIAGNOSIS — E1159 Type 2 diabetes mellitus with other circulatory complications: Secondary | ICD-10-CM | POA: Diagnosis not present

## 2017-12-31 DIAGNOSIS — L602 Onychogryphosis: Secondary | ICD-10-CM | POA: Diagnosis not present

## 2017-12-31 DIAGNOSIS — L84 Corns and callosities: Secondary | ICD-10-CM | POA: Diagnosis not present

## 2018-01-20 ENCOUNTER — Encounter: Payer: Self-pay | Admitting: Adult Health

## 2018-01-20 ENCOUNTER — Non-Acute Institutional Stay (SKILLED_NURSING_FACILITY): Payer: Medicare Other | Admitting: Adult Health

## 2018-01-20 DIAGNOSIS — R635 Abnormal weight gain: Secondary | ICD-10-CM

## 2018-01-20 DIAGNOSIS — F339 Major depressive disorder, recurrent, unspecified: Secondary | ICD-10-CM

## 2018-01-20 DIAGNOSIS — I442 Atrioventricular block, complete: Secondary | ICD-10-CM

## 2018-01-20 DIAGNOSIS — K224 Dyskinesia of esophagus: Secondary | ICD-10-CM | POA: Diagnosis not present

## 2018-01-20 DIAGNOSIS — E118 Type 2 diabetes mellitus with unspecified complications: Secondary | ICD-10-CM

## 2018-01-20 DIAGNOSIS — F0150 Vascular dementia without behavioral disturbance: Secondary | ICD-10-CM | POA: Diagnosis not present

## 2018-01-20 DIAGNOSIS — F411 Generalized anxiety disorder: Secondary | ICD-10-CM | POA: Diagnosis not present

## 2018-01-20 DIAGNOSIS — I69351 Hemiplegia and hemiparesis following cerebral infarction affecting right dominant side: Secondary | ICD-10-CM

## 2018-01-20 NOTE — Progress Notes (Signed)
Location:  Occupational psychologist of Service:  SNF (31) Provider:  Cindi Carbon, ANP [place] [phone]   [name], DO  Patient Care Team: [name], DO as PCP - General (Geriatric Medicine) [name], Altamese Dilling, MD as Attending Physician (Gastroenterology) Garvin Fila, MD as Consulting Physician (Neurology)  Extended Emergency Contact Information Primary Emergency Contact: [place] Address: [address]          [place], [place] [address] [name] of [place] Phone: [phone] Mobile Phone: [phone] Relation: Daughter  Code Status:  DNR Goals of care: Advanced Directive information Advanced Directives 07/30/2017  Does Patient Have a Medical Advance Directive? Yes  Type of Paramedic of [place];Living will;Out of facility DNR (pink MOST or yellow form)  Does patient want to make changes to medical advance directive? No - Patient declined  Copy of [place] in Chart? Yes  Pre-existing out of facility DNR order (yellow form or pink MOST form) Yellow form placed in chart (order not valid for inpatient use);Pink MOST form placed in chart (order not valid for inpatient use)     Chief Complaint  Patient presents with  . Medical Management of Chronic Issues    HPI:  Pt is a 84 y.o. female seen today for medical management of chronic diseases.   Her family had felt that she was nearing the end of life her life and in need of hospice care but she did not qualify and therefore she is followed by Palliative care. She has a hx of CVA with right sided weakness, as well as vascular dementia. MMSE 21/30. She reports some anxiety about her finances and feels a loss of independence now that her daughter manages her health care and finances. She reports she sleeps well at night and is eating well.  DM II: CBGs range 150-160 in the am  Her weight  is steadily climbing, currently at 183.6 lbs with a BMI of 34  She has a hx of dysphagia and esophageal impaction but recently her family signed a waiver allowing her to have a D1 diet rather than baby food. She has enjoyed eating this type of food and it has improved her quality of life. No issue so choking or coughing have been reported.   There was concern mentioned regarding her pacemaker battery which was put in place due to complete AV Block. Notes in epic reveal that the it is functioning well and has 46 months left on the longevity.   Functional status: lift for tranfers, intermittently incontinent Past Medical History:  Diagnosis Date  . Abnormality of gait 2007  . Acquired cyst of kidney 2002  . Acute sinus infection 11/25/2012  . Allergic rhinitis due to pollen   . Altered mental status 11/28/2012  . Anxiety state, unspecified   . Atrioventricular block, complete ([place]) 2003   s/p PPM 2003, generator chnage 2011  . Cardiac pacemaker in situ 2003  . Coronary atherosclerosis of native coronary artery 2003   non obstructive by Cath 2003  . CVA (cerebral infarction) 06/28/2012   rt hemiparesis, dysphagia  . Dendritic keratitis 2012  . Dysuria   . Edema 2003  . Esophageal dysmotility 08/19/2012  . Food impaction of esophagus 08/15/2012  . HTN (hypertension) 06/28/2012  . Hypertension   . Macular degeneration (senile) of retina, unspecified 2012  . Malignant neoplasm of breast (female), unspecified site 1970   S/P Rt radical mastectomy  .  NSTEMI (non-ST elevated myocardial infarction) ([place]) 06/28/2012  . Osteoarthrosis, unspecified whether generalized or localized, unspecified site 2013  . Pacemaker   . Personal history of colonic polyps     s/p colonoscopy/polypectomy 2003  . Pure hyperglyceridemia 2004  . Senile osteoporosis 2003  . Stroke ([place]) 06/28/2012  . Tinnitus 2007  . Type II or unspecified type diabetes mellitus without mention of complication, not stated as  uncontrolled 2003  . Unspecified arthropathy, pelvic region and thigh 2010  . Unspecified glaucoma(365.9) 2013  . Unspecified hypothyroidism 2009  . Unspecified vitamin D deficiency 2009  . Urinary retention 07/07/2012   Past Surgical History:  Procedure Laterality Date  . BOTOX INJECTION N/A 10/03/2012   Procedure: BOTOX INJECTION;  Surgeon: [name], MD;  [facility];  Service: Endoscopy;  Laterality: N/A;  . ESOPHAGOGASTRODUODENOSCOPY N/A 08/15/2012   Procedure: ESOPHAGOGASTRODUODENOSCOPY (EGD);  Surgeon: [name], MD;  [facility] ENDOSCOPY;  Service: Endoscopy;  Laterality: N/A;  . ESOPHAGOGASTRODUODENOSCOPY N/A 08/15/2012   Procedure: ESOPHAGOGASTRODUODENOSCOPY (EGD);  Surgeon: [name], MD;  Location: [facility];  Service: Endoscopy;  Laterality: N/A;  . ESOPHAGOGASTRODUODENOSCOPY N/A 10/03/2012   Procedure: ESOPHAGOGASTRODUODENOSCOPY (EGD);  Surgeon: [name], MD;  Location: [facility];  Service: Endoscopy;  Laterality: N/A;  . ESOPHAGOGASTRODUODENOSCOPY N/A 12/22/2012   Procedure: ESOPHAGOGASTRODUODENOSCOPY (EGD);  Surgeon: [name], MD;  Location: [facility] ENDOSCOPY;  Service: Endoscopy;  Laterality: N/A;  . ESOPHAGOSCOPY N/A 08/15/2012   Procedure: ESOPHAGOSCOPY;  Surgeon: [name], MD;  Location: [facility];  Service: ENT;  Laterality: N/A;  . FOREIGN BODY REMOVAL ESOPHAGEAL N/A 08/15/2012   Procedure: REMOVAL FOREIGN BODY ESOPHAGEAL;  Surgeon: [name], MD;  Location: [facility];  Service: ENT;  Laterality: N/A;  . INSERT / REPLACE / REMOVE PACEMAKER    . MASTECTOMY Right 1970   Cancer  . SAVORY DILATION N/A 10/03/2012   Procedure: SAVORY DILATION;  Surgeon: [name], MD;  [facility];  Service: Endoscopy;  Laterality: N/A;    Allergies  Allergen Reactions  . Augmentin [Amoxicillin-Pot Clavulanate] Nausea And Vomiting  . Lactose Intolerance (Gi)     Outpatient Encounter Medications as of 01/20/2018  Medication Sig  . acetaminophen (TYLENOL) 650  MG CR tablet Take 650 mg by mouth 2 (two) times daily. For pain  . carboxymethylcellulose (REFRESH PLUS) 0.5 % SOLN 2 drops 2 (two) times daily.   . Dentifrices (BIOTENE DRY MOUTH CARE DT) Place 1 spray onto teeth 4 (four) times daily.  . Docosanol (ABREVA) 10 % CREA Apply 1 application topically 5 (five) times daily. To affected area until healed and then once daily as needed  . escitalopram (LEXAPRO) 20 MG tablet Take 20 mg by mouth daily. For depression/ anxiety.  . fexofenadine (ALLEGRA) 180 MG tablet Take 180 mg by mouth daily.  Marland Kitchen GLUCERNA (GLUCERNA) LIQD Take 237 mLs by mouth daily as needed.  Marland Kitchen levothyroxine (SYNTHROID, LEVOTHROID) 75 MCG tablet Take 75 mcg by mouth daily before breakfast. For thyroid therapy  . liver oil-zinc oxide (DESITIN) 40 % ointment Apply 1 application topically as needed for irritation.  Marland Kitchen LORazepam (ATIVAN) 0.5 MG tablet Take 0.25 mg by mouth at bedtime.   Marland Kitchen losartan (COZAAR) 25 MG tablet Take 1 tablet (25 mg total) by mouth daily.  . Melatonin 5 MG TABS Take 5 tablets by mouth at bedtime.  . memantine (NAMENDA) 10 MG tablet Take 10 mg by mouth 2 (two) times daily.  . metoprolol tartrate (LOPRESSOR) 25  MG tablet Take 25 mg by mouth 2 (two) times daily.  . Multiple Vitamin (MULTIVITAMIN) tablet Take 1 tablet by mouth daily.  . Multiple Vitamins-Minerals (PRESERVISION AREDS PO) Take 1 tablet by mouth 2 (two) times daily.   Marland Kitchen omeprazole (PRILOSEC) 20 MG capsule Take 20 mg by mouth daily.  Addison Lank Hazel 14 % LIQD Apply topically as needed (for hemorrhoid flare up).   No facility-administered encounter medications on file as of 01/20/2018.     Review of Systems  Constitutional: Negative for activity change, appetite change, chills, diaphoresis, fatigue, fever and unexpected weight change.  HENT: Negative for congestion.   Respiratory: Negative for cough, shortness of breath and wheezing.   Cardiovascular: Positive for leg swelling. Negative for chest pain and  palpitations.  Gastrointestinal: Negative for abdominal distention, abdominal pain, constipation and diarrhea.  Genitourinary: Negative for difficulty urinating and dysuria.  Musculoskeletal: Positive for gait problem. Negative for arthralgias, back pain, joint swelling and myalgias.  Skin: Negative for wound.  Neurological: Positive for weakness. Negative for dizziness, tremors, seizures, syncope, facial asymmetry, speech difficulty, light-headedness, numbness and headaches.  Psychiatric/Behavioral: Positive for confusion and dysphoric mood. Negative for agitation, behavioral problems, decreased concentration and hallucinations. The patient is nervous/anxious. The patient is not hyperactive.     Immunization History  Administered Date(s) Administered  . Influenza Inj Mdck Quad Pf 12/22/2015  . Influenza Whole 12/17/2012  . Influenza,inj,Quad PF,6+ Mos 12/24/2017  . Influenza-Unspecified 12/08/2013, 12/16/2014, 12/27/2016  . PPD Test 07/18/2012  . Pneumococcal Conjugate-13 08/27/2015   Pertinent  Health Maintenance Due  Topic Date Due  . PNA vac Low Risk Adult (2 of 2 - PPSV23) 02/18/2018 (Originally 08/26/2016)  . HEMOGLOBIN A1C  02/08/2018  . FOOT EXAM  06/26/2018  . OPHTHALMOLOGY EXAM  07/04/2018  . INFLUENZA VACCINE  Completed   Fall Risk  07/10/2017 06/27/2016 11/07/2015 01/24/2015  Falls in the past year? No No No No  Risk for fall due to : - - History of fall(s) -   Functional Status Survey:    Vitals:   01/20/18 1540  Weight: 183 lb 9.6 oz (83.3 kg)   Body mass index is 34.69 kg/m.  Wt Readings from Last 3 Encounters:  01/20/18 183 lb 9.6 oz (83.3 kg)  12/24/17 180 lb (81.6 kg)  11/15/17 180 lb 1.6 oz (81.7 kg)   Physical Exam  Constitutional: She is oriented to person, place, and time. No distress.  HENT:  Head: Normocephalic and atraumatic.  Neck: No JVD present.  Cardiovascular: Normal rate and regular rhythm.  No murmur heard. Edema +1 to RUE. Edema to BLE +2  on the right and +1 on the lef.t   Pulmonary/Chest: Effort normal and breath sounds normal. No respiratory distress. She has no wheezes.  Abdominal: Soft. Bowel sounds are normal.  Neurological: She is alert and oriented to person, place, and time.  Right sided weakness  Skin: Skin is warm and dry. She is not diaphoretic.  Psychiatric: She has a normal mood and affect.  Nursing note and vitals reviewed.   Labs reviewed: Recent Labs    05/06/17 0600  NA 140  K 4.7  BUN 18  CREATININE 0.5   Recent Labs    05/06/17 0600  AST 33  ALT 20  ALKPHOS 52   No results for input(s): WBC, NEUTROABS, HGB, HCT, MCV, PLT in the last 8760 hours. Lab Results  Component Value Date   TSH 2.11 08/27/2017   Lab Results  Component Value Date  HGBA1C 8.3 08/09/2017   Lab Results  Component Value Date   CHOL 135 09/19/2015   HDL 50 09/19/2015   LDLCALC 55 09/19/2015   TRIG 149 09/19/2015   CHOLHDL 4.1 07/01/2012    Significant Diagnostic Results in last 30 days:  No results found.  Assessment/Plan  1. Esophageal dysmotility Doing well on a D1 diet per signed waiver  2. Controlled type 2 diabetes mellitus with complication, without long-term current use of insulin (HCC) Goal A1C <8% due to goals of comfort care and her age of 84 Monitor A1C q 6 months  3. Vascular dementia without behavioral disturbance (HCC) Continue Namenda 10 mg BID  4. Anxiety state Continues with periods of anxiety, would not taper ativan  Discussed strategies to cope with her feelings  5. Depression, recurrent ([place]) See above, continue Lexapro  6. Atrioventricular block, complete [facility]) Per epic notes pacemaker battery longevity 46 months with appropriate function   7. Hemiparesis affecting right side as late effect of cerebrovascular accident [facility]) Continue elevation of the right arm  Uses a lift for transfers but is still able to operate a motorized scooter   Family/ staff Communication:  staff/resident  Labs/tests ordered:  NA

## 2018-02-07 ENCOUNTER — Non-Acute Institutional Stay: Payer: Medicare Other | Admitting: Internal Medicine

## 2018-02-07 ENCOUNTER — Encounter: Payer: Self-pay | Admitting: Internal Medicine

## 2018-02-07 VITALS — BP 140/62 | HR 72 | Resp 16

## 2018-02-07 DIAGNOSIS — F4321 Adjustment disorder with depressed mood: Secondary | ICD-10-CM

## 2018-02-07 NOTE — Progress Notes (Signed)
[facility] Telephone: [phone] Fax: [phone]  PATIENT NAME: Rachel Cooke DOB: 02-18-1920 MRN: [id number]  PRIMARY CARE PROVIDER:   [name], DO  REFERRING PROVIDER:  [name], DO [place], [place] [address]  RESPONSIBLE PARTY: (dtr) Verlon Au [name]). Address: [address], [place], [place] [address]  Home Phone: [phone], Mobile Phone: [phone]  HPI / INTERVAL HISTORY: 84 yo female with history significant for vascular dementia (MMSE 23 Oct 2017), depression, AV block (PM), CVA (right sided weakness. motorized scooter) DM (not on meds), and  intertrigo.This is a f/u Palliative Care visit for ongoing symptom management of anxiety/depression.  IMPRESSION / RECOMMENDATIONS:  1. Anxiety; situational and constitutional: 10:40am: TC to daughter [name], who mentions that patient seems more easily fatigued / decreased energy and with symptoms of depression. [name] wonders that this may be r/t the holidays / seasonal depression. [name] has some guilt as she needs to leave her mom part of December to consult regarding her own health issues, and to visit some distant family members. Patient's son will be spending time with patient during this time. I reassured [name] that it's important for her to take care of herself, in order to continue to be a good care provider. And in any event, patient is receiving excellent care at [facility] and her son will be visiting as well. We discussed patient's antidepressants/anxietolytics, and possible adjustments, but [name] would like to wait till after the holidays, and also get in-put from her brother. Because her brother visits ever 4-5 months or so, [name] feels he would have a good prospective on changes in patient behavior, over time.  Staff report patient in good spirits over these last few months, without signs/symptoms depression. They report poor short term memory, but no symptoms of agitation.  She remembers and recognizes family members, and staff members by facial recognition. Patient has been actively participating in activities. She remains dependent in hygiene and transfers. She can feed herself her pureed diet. She has exhibited no functional decline. No signs of aspiration noted by staff. She is continent of [employer], alerting staff when she needs to toilet.   Today patient is bright and pleasantly conversant. She admits episodic anxiety b/c of concern over her daughter's health issues, but she acknowledges that her daughter is seeking proper help and taking care of herself. Her anxiety is improved with talking it through. She mentions she greatly enjoys facility activities and goes to all that are available. She has very good recall of current family events, but asked me repetitious questions regarding my name, role, organization, etc. She appears to be on a good medication regimen (Lexapro 20mg  qd, Ativan 0.25mg  qhs) at this time. She continues on Namenda.  2. Advanced Care planning: DNR/MOST form in place.    3. Follow up:             A. PC NP visit in 2-3 months.   I spent 25 minutes providing this consultation, from 11:15 am to 11:40am. More than 50% of this time was spent coordinating communication.   CODE STATUS: DNR, MOST (DNR, Comfort Measures, No Antibiotice, No IVFs, No Tube Feedings).   PPS: 30%. ROS without significant changes.  HOSPICE ELIGIBILITY: No, as prognosis thought greater than 6 months.  PAST MEDICAL HISTORY:  Past Medical History:  Diagnosis Date  . Abnormality of gait 2007  . Acquired cyst of kidney 2002  . Acute sinus infection 11/25/2012  . Allergic rhinitis due to  pollen   . Altered mental status 11/28/2012  . Anxiety state, unspecified   . Atrioventricular block, complete ([place]) 2003   s/p PPM 2003, generator chnage 2011  . Cardiac pacemaker in situ 2003  . Coronary atherosclerosis of native coronary artery 2003   non obstructive by Cath 2003   . CVA (cerebral infarction) 06/28/2012   rt hemiparesis, dysphagia  . Dendritic keratitis 2012  . Dysuria   . Edema 2003  . Esophageal dysmotility 08/19/2012  . Food impaction of esophagus 08/15/2012  . HTN (hypertension) 06/28/2012  . Hypertension   . Macular degeneration (senile) of retina, unspecified 2012  . Malignant neoplasm of breast (female), unspecified site 1970   S/P Rt radical mastectomy  . NSTEMI (non-ST elevated myocardial infarction) ([place]) 06/28/2012  . Osteoarthrosis, unspecified whether generalized or localized, unspecified site 2013  . Pacemaker   . Personal history of colonic polyps     s/p colonoscopy/polypectomy 2003  . Pure hyperglyceridemia 2004  . Senile osteoporosis 2003  . Stroke ([place]) 06/28/2012  . Tinnitus 2007  . Type II or unspecified type diabetes mellitus without mention of complication, not stated as uncontrolled 2003  . Unspecified arthropathy, pelvic region and thigh 2010  . Unspecified glaucoma(365.9) 2013  . Unspecified hypothyroidism 2009  . Unspecified vitamin D deficiency 2009  . Urinary retention 07/07/2012    SOCIAL HX:  Social History   Tobacco Use  . Smoking status: Former Smoker    Packs/day: 0.25    Years: 3.00    Pack years: 0.75  . Smokeless tobacco: Never Used  Substance Use Topics  . Alcohol use: No    ALLERGIES:  Allergies  Allergen Reactions  . Augmentin [Amoxicillin-Pot Clavulanate] Nausea And Vomiting  . Lactose Intolerance (Gi)      PERTINENT MEDICATIONS:  Outpatient Encounter Medications as of 02/07/2018  Medication Sig  . acetaminophen (TYLENOL) 650 MG CR tablet Take 650 mg by mouth 2 (two) times daily. For pain  . carboxymethylcellulose (REFRESH PLUS) 0.5 % SOLN 2 drops 2 (two) times daily.   . Dentifrices (BIOTENE DRY MOUTH CARE DT) Place 1 spray onto teeth 4 (four) times daily.  . Docosanol (ABREVA) 10 % CREA Apply 1 application topically 5 (five) times daily. To affected area until healed and then once daily as  needed  . escitalopram (LEXAPRO) 20 MG tablet Take 20 mg by mouth daily. For depression/ anxiety.  . fexofenadine (ALLEGRA) 180 MG tablet Take 180 mg by mouth daily.  Marland Kitchen GLUCERNA (GLUCERNA) LIQD Take 237 mLs by mouth daily as needed.  Marland Kitchen levothyroxine (SYNTHROID, LEVOTHROID) 75 MCG tablet Take 75 mcg by mouth daily before breakfast. For thyroid therapy  . liver oil-zinc oxide (DESITIN) 40 % ointment Apply 1 application topically as needed for irritation.  Marland Kitchen LORazepam (ATIVAN) 0.5 MG tablet Take 0.25 mg by mouth at bedtime.   Marland Kitchen losartan (COZAAR) 25 MG tablet Take 1 tablet (25 mg total) by mouth daily.  . Melatonin 5 MG TABS Take 5 tablets by mouth at bedtime.  . memantine (NAMENDA) 10 MG tablet Take 10 mg by mouth 2 (two) times daily.  . metoprolol tartrate (LOPRESSOR) 25 MG tablet Take 25 mg by mouth 2 (two) times daily.  . Multiple Vitamin (MULTIVITAMIN) tablet Take 1 tablet by mouth daily.  . Multiple Vitamins-Minerals (PRESERVISION AREDS PO) Take 1 tablet by mouth 2 (two) times daily.   Marland Kitchen omeprazole (PRILOSEC) 20 MG capsule Take 20 mg by mouth daily.  Addison Lank Hazel 14 % LIQD Apply  topically as needed (for hemorrhoid flare up).   No facility-administered encounter medications on file as of 02/07/2018.     PHYSICAL EXAM:   Well nourished, pleasantly conversant elderly Caucasian female looking younger than her 63 years. She is alert and engaging.  Cardiovascular: regular rate and rhythm Pulmonary: bibasilar soft inspiratory crackles Abdomen: soft, nontender, + bowel sounds Extremities: bilateral LE softly pitting edema 3/4 up calves, non-weeping.  Skin: no rashes Neurological:  nonfocal  Julianne Handler, NP

## 2018-02-10 ENCOUNTER — Non-Acute Institutional Stay (SKILLED_NURSING_FACILITY): Payer: Medicare Other | Admitting: Adult Health

## 2018-02-10 ENCOUNTER — Encounter: Payer: Self-pay | Admitting: Adult Health

## 2018-02-10 DIAGNOSIS — B3731 Acute candidiasis of vulva and vagina: Secondary | ICD-10-CM

## 2018-02-10 DIAGNOSIS — I679 Cerebrovascular disease, unspecified: Secondary | ICD-10-CM | POA: Diagnosis not present

## 2018-02-10 DIAGNOSIS — J3089 Other allergic rhinitis: Secondary | ICD-10-CM | POA: Diagnosis not present

## 2018-02-10 DIAGNOSIS — K224 Dyskinesia of esophagus: Secondary | ICD-10-CM

## 2018-02-10 DIAGNOSIS — B373 Candidiasis of vulva and vagina: Secondary | ICD-10-CM

## 2018-02-10 DIAGNOSIS — F0150 Vascular dementia without behavioral disturbance: Secondary | ICD-10-CM

## 2018-02-10 DIAGNOSIS — E118 Type 2 diabetes mellitus with unspecified complications: Secondary | ICD-10-CM

## 2018-02-10 DIAGNOSIS — I10 Essential (primary) hypertension: Secondary | ICD-10-CM | POA: Diagnosis not present

## 2018-02-10 NOTE — Progress Notes (Signed)
Location:  Occupational psychologist of Service:  SNF (31) Provider:  Cindi Carbon, ANP [place] [phone]   [name], DO  Patient Care Team: [name], DO as PCP - General (Geriatric Medicine) [name], Altamese Dilling, MD as Attending Physician (Gastroenterology) Garvin Fila, MD as Consulting Physician (Neurology) Julianne Handler, NP as Nurse Practitioner [facility] and Palliative Medicine)  Extended Emergency Contact Information Primary Emergency Contact: [place] Address: [address]          [place], [place] [address] [name] of [place] Phone: [phone] Mobile Phone: [phone] Relation: Daughter  Code Status:  DNR Goals of care: Advanced Directive information Advanced Directives 07/30/2017  Does Patient Have a Medical Advance Directive? Yes  Type of Paramedic of [place];Living will;Out of facility DNR (pink MOST or yellow form)  Does patient want to make changes to medical advance directive? No - Patient declined  Copy of [place] in Chart? Yes  Pre-existing out of facility DNR order (yellow form or pink MOST form) Yellow form placed in chart (order not valid for inpatient use);Pink MOST form placed in chart (order not valid for inpatient use)     Chief Complaint  Patient presents with  . Medical Management of Chronic Issues    HPI:  Pt is a 84 y.o. female seen today for medical management of chronic diseases.    Cerebrovascular disease and vascular dementia She has a hx of CVA with right sided weakness, as well as vascular dementia. MMSE 21/30. Currently namenda. Able to operate her motorized scooter. Not participating in activities and fatiguing more easily per nurse.  Allergic rhinitis: chronically drippy nose, no itchy water eyes or cough  DM II: CBGs range CBG 170-180' in the am  HTN: controlled  Dysphagia and  esophageal impaction: family signed a waiver allowing her to have a D1 diet rather than baby food. No reported issus with coughing or choking.    Functional status: lift for tranfers, intermittently incontinent Past Medical History:  Diagnosis Date  . Abnormality of gait 2007  . Acquired cyst of kidney 2002  . Acute sinus infection 11/25/2012  . Allergic rhinitis due to pollen   . Altered mental status 11/28/2012  . Anxiety state, unspecified   . Atrioventricular block, complete ([place]) 2003   s/p PPM 2003, generator chnage 2011  . Cardiac pacemaker in situ 2003  . Coronary atherosclerosis of native coronary artery 2003   non obstructive by Cath 2003  . CVA (cerebral infarction) 06/28/2012   rt hemiparesis, dysphagia  . Dendritic keratitis 2012  . Dysuria   . Edema 2003  . Esophageal dysmotility 08/19/2012  . Food impaction of esophagus 08/15/2012  . HTN (hypertension) 06/28/2012  . Hypertension   . Macular degeneration (senile) of retina, unspecified 2012  . Malignant neoplasm of breast (female), unspecified site 1970   S/P Rt radical mastectomy  . NSTEMI (non-ST elevated myocardial infarction) ([place]) 06/28/2012  . Osteoarthrosis, unspecified whether generalized or localized, unspecified site 2013  . Pacemaker   . Personal history of colonic polyps     s/p colonoscopy/polypectomy 2003  . Pure hyperglyceridemia 2004  . Senile osteoporosis 2003  . Stroke ([place]) 06/28/2012  . Tinnitus 2007  . Type II or unspecified type diabetes mellitus without mention of complication, not stated as uncontrolled 2003  . Unspecified arthropathy, pelvic region and thigh 2010  . Unspecified glaucoma(365.9) 2013  . Unspecified hypothyroidism 2009  .  Unspecified vitamin D deficiency 2009  . Urinary retention 07/07/2012   Past Surgical History:  Procedure Laterality Date  . BOTOX INJECTION N/A 10/03/2012   Procedure: BOTOX INJECTION;  Surgeon: [name], MD;  Location: [facility] ENDOSCOPY;  Service: Endoscopy;   Laterality: N/A;  . ESOPHAGOGASTRODUODENOSCOPY N/A 08/15/2012   Procedure: ESOPHAGOGASTRODUODENOSCOPY (EGD);  Surgeon: [name], MD;  [facility];  Service: Endoscopy;  Laterality: N/A;  . ESOPHAGOGASTRODUODENOSCOPY N/A 08/15/2012   Procedure: ESOPHAGOGASTRODUODENOSCOPY (EGD);  Surgeon: [name], MD;  Location: [facility];  Service: Endoscopy;  Laterality: N/A;  . ESOPHAGOGASTRODUODENOSCOPY N/A 10/03/2012   Procedure: ESOPHAGOGASTRODUODENOSCOPY (EGD);  Surgeon: [name], MD;  Location: [facility] ENDOSCOPY;  Service: Endoscopy;  Laterality: N/A;  . ESOPHAGOGASTRODUODENOSCOPY N/A 12/22/2012   Procedure: ESOPHAGOGASTRODUODENOSCOPY (EGD);  Surgeon: [name], MD;  Location: [facility] ENDOSCOPY;  Service: Endoscopy;  Laterality: N/A;  . ESOPHAGOSCOPY N/A 08/15/2012   Procedure: ESOPHAGOSCOPY;  Surgeon: [name], MD;  Location: [facility];  Service: ENT;  Laterality: N/A;  . FOREIGN BODY REMOVAL ESOPHAGEAL N/A 08/15/2012   Procedure: REMOVAL FOREIGN BODY ESOPHAGEAL;  Surgeon: [name], MD;  Location: [facility];  Service: ENT;  Laterality: N/A;  . INSERT / REPLACE / REMOVE PACEMAKER    . MASTECTOMY Right 1970   Cancer  . SAVORY DILATION N/A 10/03/2012   Procedure: SAVORY DILATION;  Surgeon: [name], MD;  Location: [facility] ENDOSCOPY;  Service: Endoscopy;  Laterality: N/A;    Allergies  Allergen Reactions  . Augmentin [Amoxicillin-Pot Clavulanate] Nausea And Vomiting  . Lactose Intolerance (Gi)     Outpatient Encounter Medications as of 02/10/2018  Medication Sig  . acetaminophen (TYLENOL) 650 MG CR tablet Take 650 mg by mouth 2 (two) times daily. For pain  . carboxymethylcellulose (REFRESH PLUS) 0.5 % SOLN 2 drops 2 (two) times daily.   . Dentifrices (BIOTENE DRY MOUTH CARE DT) Place 1 spray onto teeth 4 (four) times daily.  . Docosanol (ABREVA) 10 % CREA Apply 1 application topically 5 (five) times daily. To affected area until healed and then once daily as needed  . escitalopram (LEXAPRO) 20 MG  tablet Take 20 mg by mouth daily. For depression/ anxiety.  . fexofenadine (ALLEGRA) 180 MG tablet Take 180 mg by mouth daily.  Marland Kitchen GLUCERNA (GLUCERNA) LIQD Take 237 mLs by mouth daily as needed.  Marland Kitchen levothyroxine (SYNTHROID, LEVOTHROID) 75 MCG tablet Take 75 mcg by mouth daily before breakfast. For thyroid therapy  . liver oil-zinc oxide (DESITIN) 40 % ointment Apply 1 application topically as needed for irritation.  Marland Kitchen LORazepam (ATIVAN) 0.5 MG tablet Take 0.25 mg by mouth at bedtime.   Marland Kitchen losartan (COZAAR) 25 MG tablet Take 1 tablet (25 mg total) by mouth daily.  . Melatonin 5 MG TABS Take 5 tablets by mouth at bedtime.  . memantine (NAMENDA) 10 MG tablet Take 10 mg by mouth 2 (two) times daily.  . metoprolol tartrate (LOPRESSOR) 25 MG tablet Take 25 mg by mouth 2 (two) times daily.  . Multiple Vitamin (MULTIVITAMIN) tablet Take 1 tablet by mouth daily.  . Multiple Vitamins-Minerals (PRESERVISION AREDS PO) Take 1 tablet by mouth 2 (two) times daily.   Marland Kitchen omeprazole (PRILOSEC) 20 MG capsule Take 20 mg by mouth daily.  Addison Lank Hazel 14 % LIQD Apply topically as needed (for hemorrhoid flare up).   No facility-administered encounter medications on file as of 02/10/2018.     Review of Systems  Constitutional: Negative for chills, diaphoresis, fever, malaise/fatigue  and weight loss.  Respiratory: Negative for cough, hemoptysis, sputum production and wheezing.   Cardiovascular: Positive for leg swelling. Negative for chest pain and PND.  Gastrointestinal: Negative for abdominal pain, constipation, diarrhea, nausea and vomiting.  Genitourinary: Negative for dysuria and urgency.  Musculoskeletal: Negative for back pain, falls, joint pain, myalgias and neck pain.  Skin: Negative for itching and rash.  Neurological: Positive for weakness.  Psychiatric/Behavioral: Positive for depression and memory loss. Negative for hallucinations. The patient does not have insomnia.       Immunization History    Administered Date(s) Administered  . Influenza Inj Mdck Quad Pf 12/22/2015  . Influenza Whole 12/17/2012  . Influenza,inj,Quad PF,6+ Mos 12/24/2017  . Influenza-Unspecified 12/08/2013, 12/16/2014, 12/27/2016  . PPD Test 07/18/2012  . Pneumococcal Conjugate-13 08/27/2015   Pertinent  Health Maintenance Due  Topic Date Due  . HEMOGLOBIN A1C  02/08/2018  . PNA vac Low Risk Adult (2 of 2 - PPSV23) 02/18/2018 (Originally 08/26/2016)  . FOOT EXAM  06/26/2018  . OPHTHALMOLOGY EXAM  07/04/2018  . INFLUENZA VACCINE  Completed   Fall Risk  07/10/2017 06/27/2016 11/07/2015 01/24/2015  Falls in the past year? No No No No  Risk for fall due to : - - History of fall(s) -   Functional Status Survey:    Vitals:   02/10/18 1605  Weight: 179 lb 3.2 oz (81.3 kg)   Body mass index is 33.86 kg/m.  Wt Readings from Last 3 Encounters:  02/10/18 179 lb 3.2 oz (81.3 kg)  01/20/18 183 lb 9.6 oz (83.3 kg)  12/24/17 180 lb (81.6 kg)   Physical Exam  Constitutional: She is oriented to person, place, and time. No distress.  HENT:  Head: Normocephalic and atraumatic.  Neck: No JVD present.  Cardiovascular: Normal rate and regular rhythm.  No murmur heard. Edema +1 to RUE. Edema to BLE +2 on the right and +1 on the left   Pulmonary/Chest: Effort normal and breath sounds normal. No respiratory distress. She has no wheezes.  Abdominal: Soft. Bowel sounds are normal.  Neurological: She is alert and oriented to person, place, and time.  Right sided weakness  Skin: Skin is warm and dry. She is not diaphoretic.  Psychiatric: She has a normal mood and affect.  Nursing note and vitals reviewed.   Labs reviewed: Recent Labs    05/06/17 0600  NA 140  K 4.7  BUN 18  CREATININE 0.5   Recent Labs    05/06/17 0600  AST 33  ALT 20  ALKPHOS 52   No results for input(s): WBC, NEUTROABS, HGB, HCT, MCV, PLT in the last 8760 hours. Lab Results  Component Value Date   TSH 2.11 08/27/2017   Lab Results   Component Value Date   HGBA1C 8.3 08/09/2017   Lab Results  Component Value Date   CHOL 135 09/19/2015   HDL 50 09/19/2015   LDLCALC 55 09/19/2015   TRIG 149 09/19/2015   CHOLHDL 4.1 07/01/2012    Significant Diagnostic Results in last 30 days:  No results found.  Assessment/Plan  1. Vaginal candidiasis Nystatin cream to vaginal area BID x 10 days  2. Esophageal dysmotility Continue asp prec and monitor closely as there is signed a waiver to have foods against medical advice  3. Non-seasonal allergic rhinitis, unspecified trigger Continue Allegra 180 mg qd  4. Essential hypertension Controlled Continue lopressor 25 mg BID and losartan 25 mg qd  5. Cerebrovascular disease With hx of CVA Off anti platelet  therapy due to hx of GIB Goals of care are comfort based  6. Vascular dementia without behavioral disturbance (HCC) Showing signs of progressive cognitive decline Continue Namenda 10 mg BID Consider treating for depression   7. Controlled type 2 diabetes mellitus with complication, without long-term current use of insulin (HCC) Check A1C  Family/ staff Communication: staff/resident  Labs/tests ordered:  NA

## 2018-02-11 DIAGNOSIS — E119 Type 2 diabetes mellitus without complications: Secondary | ICD-10-CM | POA: Diagnosis not present

## 2018-02-11 DIAGNOSIS — E0821 Diabetes mellitus due to underlying condition with diabetic nephropathy: Secondary | ICD-10-CM | POA: Diagnosis not present

## 2018-02-11 LAB — HEMOGLOBIN A1C: Hemoglobin A1C: 7.6

## 2018-02-25 ENCOUNTER — Ambulatory Visit (INDEPENDENT_AMBULATORY_CARE_PROVIDER_SITE_OTHER): Payer: Medicare Other

## 2018-02-25 DIAGNOSIS — I442 Atrioventricular block, complete: Secondary | ICD-10-CM

## 2018-02-26 LAB — CUP PACEART REMOTE DEVICE CHECK
Brady Statistic AP VS Percent: 0 %
Brady Statistic AS VP Percent: 99 %
Date Time Interrogation Session: 20191224144514
Implantable Lead Implant Date: 20110506
Implantable Lead Location: 753859
Implantable Lead Location: 753860
Implantable Lead Model: 5076
Lead Channel Pacing Threshold Amplitude: 0.75 V
Lead Channel Pacing Threshold Amplitude: 0.75 V
Lead Channel Pacing Threshold Pulse Width: 0.4 ms
Lead Channel Pacing Threshold Pulse Width: 0.4 ms
Lead Channel Setting Pacing Amplitude: 2 V
Lead Channel Setting Pacing Amplitude: 2.5 V
Lead Channel Setting Pacing Pulse Width: 0.4 ms
MDC IDC LEAD IMPLANT DT: 20110506
MDC IDC MSMT BATTERY IMPEDANCE: 1381 Ohm
MDC IDC MSMT BATTERY REMAINING LONGEVITY: 43 mo
MDC IDC MSMT BATTERY VOLTAGE: 2.76 V
MDC IDC MSMT LEADCHNL RA IMPEDANCE VALUE: 467 Ohm
MDC IDC MSMT LEADCHNL RV IMPEDANCE VALUE: 419 Ohm
MDC IDC PG IMPLANT DT: 20110506
MDC IDC SET LEADCHNL RV SENSING SENSITIVITY: 2.8 mV
MDC IDC STAT BRADY AP VP PERCENT: 0 %
MDC IDC STAT BRADY AS VS PERCENT: 0 %

## 2018-02-27 NOTE — Progress Notes (Signed)
Remote pacemaker transmission.   

## 2018-03-07 ENCOUNTER — Non-Acute Institutional Stay (SKILLED_NURSING_FACILITY): Payer: Medicare Other | Admitting: Adult Health

## 2018-03-07 ENCOUNTER — Encounter: Payer: Self-pay | Admitting: Adult Health

## 2018-03-07 DIAGNOSIS — F339 Major depressive disorder, recurrent, unspecified: Secondary | ICD-10-CM

## 2018-03-07 NOTE — Progress Notes (Signed)
Location:  Occupational psychologist of Service:  SNF (31) Provider:   Cindi Carbon, ANP [place] [phone]   [name], DO  Patient Care Team: [name], DO as PCP - General (Geriatric Medicine) Community, [facility] [name], Altamese Dilling, MD as Attending Physician (Gastroenterology) Garvin Fila, MD as Consulting Physician (Neurology) Julianne Handler, NP as Nurse Practitioner [facility] and Palliative Medicine)  Extended Emergency Contact Information Primary Emergency Contact: [name] Address: [address]          [place], [place] [address] [name] of [place] Phone: [phone] Mobile Phone: [phone] Relation: Daughter  Code Status:  DNR Goals of care: Advanced Directive information Advanced Directives 07/30/2017  Does Patient Have a Medical Advance Directive? Yes  Type of Paramedic of [place];Living will;Out of facility DNR (pink MOST or yellow form)  Does patient want to make changes to medical advance directive? No - Patient declined  Copy of [place] in Chart? Yes  Pre-existing out of facility DNR order (yellow form or pink MOST form) Yellow form placed in chart (order not valid for inpatient use);Pink MOST form placed in chart (order not valid for inpatient use)     Chief Complaint  Patient presents with  . Acute Visit    eval for depression    HPI:  Pt is a 85 y.o. female seen today for an acute visit for evaluation for depression. Rachel Cooke resides in skilled care and has a long history of depression and anxiety and has been on Lexapro. For the past 2-3 months she has experienced lethargy, lack of energy and disinterest in going to activities at [place]. She is dwelling on her family issues and feels bad that the grandkids don't come to visit. She feels anxious at times and sad times. She denies any issues with appetite or sleep. She denies  feelings of SI.  She wishes she had more energy and motivation to attend meetings and activities at [facility] and would dwell less on the negative.   Past Medical History:  Diagnosis Date  . Abnormality of gait 2007  . Acquired cyst of kidney 2002  . Acute sinus infection 11/25/2012  . Allergic rhinitis due to pollen   . Altered mental status 11/28/2012  . Anxiety state, unspecified   . Atrioventricular block, complete ([place]) 2003   s/p PPM 2003, generator chnage 2011  . Cardiac pacemaker in situ 2003  . Coronary atherosclerosis of native coronary artery 2003   non obstructive by Cath 2003  . CVA (cerebral infarction) 06/28/2012   rt hemiparesis, dysphagia  . Dendritic keratitis 2012  . Dysuria   . Edema 2003  . Esophageal dysmotility 08/19/2012  . Food impaction of esophagus 08/15/2012  . HTN (hypertension) 06/28/2012  . Hypertension   . Macular degeneration (senile) of retina, unspecified 2012  . Malignant neoplasm of breast (female), unspecified site 1970   S/P Rt radical mastectomy  . NSTEMI (non-ST elevated myocardial infarction) ([place]) 06/28/2012  . Osteoarthrosis, unspecified whether generalized or localized, unspecified site 2013  . Pacemaker   . Personal history of colonic polyps     s/p colonoscopy/polypectomy 2003  . Pure hyperglyceridemia 2004  . Senile osteoporosis 2003  . Stroke ([place]) 06/28/2012  . Tinnitus 2007  . Type II or unspecified type diabetes mellitus without mention of complication, not stated as uncontrolled 2003  . Unspecified arthropathy, pelvic region and thigh 2010  . Unspecified glaucoma(365.9) 2013  .  Unspecified hypothyroidism 2009  . Unspecified vitamin D deficiency 2009  . Urinary retention 07/07/2012   Past Surgical History:  Procedure Laterality Date  . BOTOX INJECTION N/A 10/03/2012   Procedure: BOTOX INJECTION;  Surgeon: [name], MD;  [facility];  Service: Endoscopy;  Laterality: N/A;  . ESOPHAGOGASTRODUODENOSCOPY N/A 08/15/2012    Procedure: ESOPHAGOGASTRODUODENOSCOPY (EGD);  Surgeon: [name], MD;  Location: [facility] ENDOSCOPY;  Service: Endoscopy;  Laterality: N/A;  . ESOPHAGOGASTRODUODENOSCOPY N/A 08/15/2012   Procedure: ESOPHAGOGASTRODUODENOSCOPY (EGD);  Surgeon: [name], MD;  Location: [place];  Service: Endoscopy;  Laterality: N/A;  . ESOPHAGOGASTRODUODENOSCOPY N/A 10/03/2012   Procedure: ESOPHAGOGASTRODUODENOSCOPY (EGD);  Surgeon: [name], MD;  Location: [facility];  Service: Endoscopy;  Laterality: N/A;  . ESOPHAGOGASTRODUODENOSCOPY N/A 12/22/2012   Procedure: ESOPHAGOGASTRODUODENOSCOPY (EGD);  Surgeon: [name], MD;  Location: [facility] ENDOSCOPY;  Service: Endoscopy;  Laterality: N/A;  . ESOPHAGOSCOPY N/A 08/15/2012   Procedure: ESOPHAGOSCOPY;  Surgeon: [name], MD;  Location: [place];  Service: ENT;  Laterality: N/A;  . FOREIGN BODY REMOVAL ESOPHAGEAL N/A 08/15/2012   Procedure: REMOVAL FOREIGN BODY ESOPHAGEAL;  Surgeon: [name], MD;  Location: [place];  Service: ENT;  Laterality: N/A;  . INSERT / REPLACE / REMOVE PACEMAKER    . MASTECTOMY Right 1970   Cancer  . SAVORY DILATION N/A 10/03/2012   Procedure: SAVORY DILATION;  Surgeon: [name], MD;  [facility];  Service: Endoscopy;  Laterality: N/A;    Allergies  Allergen Reactions  . Augmentin [Amoxicillin-Pot Clavulanate] Nausea And Vomiting  . Lactose Intolerance (Gi)     Outpatient Encounter Medications as of 03/07/2018  Medication Sig  . acetaminophen (TYLENOL) 650 MG CR tablet Take 650 mg by mouth 2 (two) times daily. For pain  . carboxymethylcellulose (REFRESH PLUS) 0.5 % SOLN 2 drops 2 (two) times daily.   . Dentifrices (BIOTENE DRY MOUTH CARE DT) Place 1 spray onto teeth 4 (four) times daily.  . Docosanol (ABREVA) 10 % CREA Apply 1 application topically 5 (five) times daily. To affected area until healed and then once daily as needed  . escitalopram (LEXAPRO) 20 MG tablet Take 20 mg by mouth daily. For depression/ anxiety.  .  fexofenadine (ALLEGRA) 180 MG tablet Take 180 mg by mouth daily.  Marland Kitchen GLUCERNA (GLUCERNA) LIQD Take 237 mLs by mouth daily as needed.  Marland Kitchen levothyroxine (SYNTHROID, LEVOTHROID) 75 MCG tablet Take 75 mcg by mouth daily before breakfast. For thyroid therapy  . liver oil-zinc oxide (DESITIN) 40 % ointment Apply 1 application topically as needed for irritation.  Marland Kitchen LORazepam (ATIVAN) 0.5 MG tablet Take 0.25 mg by mouth at bedtime.   Marland Kitchen losartan (COZAAR) 25 MG tablet Take 1 tablet (25 mg total) by mouth daily.  . Melatonin 5 MG TABS Take 5 tablets by mouth at bedtime.  . memantine (NAMENDA) 10 MG tablet Take 10 mg by mouth 2 (two) times daily.  . metoprolol tartrate (LOPRESSOR) 25 MG tablet Take 25 mg by mouth 2 (two) times daily.  . Multiple Vitamin (MULTIVITAMIN) tablet Take 1 tablet by mouth daily.  . Multiple Vitamins-Minerals (PRESERVISION AREDS PO) Take 1 tablet by mouth 2 (two) times daily.   Marland Kitchen omeprazole (PRILOSEC) 20 MG capsule Take 20 mg by mouth daily.  Addison Lank Hazel 14 % LIQD Apply topically as needed (for hemorrhoid flare up).   No facility-administered encounter medications on file as of 03/07/2018.     Review of Systems  Constitutional: Positive  for activity change and fatigue. Negative for appetite change, chills, diaphoresis, fever and unexpected weight change.  HENT: Negative for congestion.   Respiratory: Negative for cough, shortness of breath and wheezing.   Cardiovascular: Positive for leg swelling. Negative for chest pain and palpitations.  Gastrointestinal: Negative for abdominal distention, abdominal pain, constipation and diarrhea.  Genitourinary: Negative for difficulty urinating and dysuria.  Musculoskeletal: Positive for gait problem. Negative for arthralgias, back pain, joint swelling and myalgias.  Neurological: Positive for weakness (right sided). Negative for dizziness, tremors, seizures, syncope, facial asymmetry, speech difficulty, light-headedness, numbness and  headaches.  Psychiatric/Behavioral: Positive for dysphoric mood. Negative for agitation, behavioral problems, confusion, hallucinations, self-injury, sleep disturbance and suicidal ideas. The patient is nervous/anxious. The patient is not hyperactive.        Worsening memory loss    Immunization History  Administered Date(s) Administered  . Influenza Inj Mdck Quad Pf 12/22/2015  . Influenza Whole 12/17/2012  . Influenza,inj,Quad PF,6+ Mos 12/24/2017  . Influenza-Unspecified 12/08/2013, 12/16/2014, 12/27/2016  . PPD Test 07/18/2012  . Pneumococcal Conjugate-13 08/27/2015   Pertinent  Health Maintenance Due  Topic Date Due  . PNA vac Low Risk Adult (2 of 2 - PPSV23) 08/26/2016  . HEMOGLOBIN A1C  02/08/2018  . FOOT EXAM  06/26/2018  . OPHTHALMOLOGY EXAM  07/04/2018  . INFLUENZA VACCINE  Completed   Fall Risk  07/10/2017 06/27/2016 11/07/2015 01/24/2015  Falls in the past year? No No No No  Risk for fall due to : - - History of fall(s) -   Functional Status Survey:    Vitals:   03/07/18 1050  BP: 129/72  Pulse: 70  Resp: 20  SpO2: 92%   There is no height or weight on file to calculate BMI. Physical Exam Vitals signs and nursing note reviewed.  Constitutional:      General: She is not in acute distress.    Appearance: She is not diaphoretic.  HENT:     Head: Normocephalic and atraumatic.  Neck:     Vascular: No JVD.  Cardiovascular:     Rate and Rhythm: Normal rate and regular rhythm.     Heart sounds: No murmur.  Pulmonary:     Effort: Pulmonary effort is normal. No respiratory distress.     Breath sounds: Normal breath sounds. No wheezing.  Musculoskeletal:     Right lower leg: Edema present.     Left lower leg: Edema present.  Skin:    General: Skin is warm and dry.  Neurological:     Mental Status: She is alert and oriented to person, place, and time.     Labs reviewed: Recent Labs    05/06/17 0600  NA 140  K 4.7  BUN 18  CREATININE 0.5   Recent Labs      05/06/17 0600  AST 33  ALT 20  ALKPHOS 52   No results for input(s): WBC, NEUTROABS, HGB, HCT, MCV, PLT in the last 8760 hours. Lab Results  Component Value Date   TSH 2.11 08/27/2017   Lab Results  Component Value Date   HGBA1C 8.3 08/09/2017   Lab Results  Component Value Date   CHOL 135 09/19/2015   HDL 50 09/19/2015   LDLCALC 55 09/19/2015   TRIG 149 09/19/2015   CHOLHDL 4.1 07/01/2012    Significant Diagnostic Results in last 30 days:  No results found.  Assessment/Plan  1. Depression, recurrent (HCC) Effexor 75 mg qd in hope to improve s/s of depression, anxiety, and lethargy.  Decrease Lexapro to 10 mg qd x 1 week then d/c   Family/ staff Communication: discussed with Ms. Pinkus and her nurse  Labs/tests ordered:  NA

## 2018-03-11 DIAGNOSIS — E1159 Type 2 diabetes mellitus with other circulatory complications: Secondary | ICD-10-CM | POA: Diagnosis not present

## 2018-03-11 DIAGNOSIS — L602 Onychogryphosis: Secondary | ICD-10-CM | POA: Diagnosis not present

## 2018-03-11 DIAGNOSIS — L84 Corns and callosities: Secondary | ICD-10-CM | POA: Diagnosis not present

## 2018-03-14 ENCOUNTER — Non-Acute Institutional Stay (SKILLED_NURSING_FACILITY): Payer: Medicare Other | Admitting: Adult Health

## 2018-03-14 ENCOUNTER — Encounter: Payer: Self-pay | Admitting: Adult Health

## 2018-03-14 DIAGNOSIS — F339 Major depressive disorder, recurrent, unspecified: Secondary | ICD-10-CM

## 2018-03-14 DIAGNOSIS — I5033 Acute on chronic diastolic (congestive) heart failure: Secondary | ICD-10-CM | POA: Diagnosis not present

## 2018-03-14 NOTE — Progress Notes (Signed)
Location:  Occupational psychologist of Service:  SNF (31) Provider:   Cindi Carbon, ANP [place] [phone]   [name], DO  Patient Care Team: [name], DO as PCP - General (Geriatric Medicine) Community, Well [name], Altamese Dilling, MD as Attending Physician (Gastroenterology) Garvin Fila, MD as Consulting Physician (Neurology) Julianne Handler, NP as Nurse Practitioner [facility] and Palliative Medicine)  Extended Emergency Contact Information Primary Emergency Contact: [place] Address: [address]          [place], [place] [address] [name] of [place] Phone: [phone] Mobile Phone: [phone] Relation: Daughter  Code Status:  DNR Goals of care: Advanced Directive information Advanced Directives 07/30/2017  Does Patient Have a Medical Advance Directive? Yes  Type of Paramedic of [place];Living will;Out of facility DNR (pink MOST or yellow form)  Does patient want to make changes to medical advance directive? No - Patient declined  Copy of [place] in Chart? Yes  Pre-existing out of facility DNR order (yellow form or pink MOST form) Yellow form placed in chart (order not valid for inpatient use);Pink MOST form placed in chart (order not valid for inpatient use)     Chief Complaint  Patient presents with  . Acute Visit    weight gain, not tolerating new med    HPI:  Pt is a 85 y.o. female seen today for an acute visit for weight gain and not tolerating new medication. She was started on Effexor for depression one week ago. Her daughter reports increased anxiety and feeling like she could scream.  In addition, she has again 20 lbs in the past two weeks. She has more edema in her right hand and both legs. She is fatigued. She also has blue discoloration to both hands and sats are 88-91% on room air. She is not short of breath and does not have any  chest pain. She has been gaining weight having more edema over time. The bluish discoloration was noted also on 1/3 during my visit but no change in edema or weight at that time. We checked her oxygen levels at that time and they were within normal limits. She has a most form indicating comfort care only with no labs, IV fluid, tube feeding, or antibiotics. She is a DNR.   2D echo 02/15/14 Impressions:  - Normal LV size with mild LV hypertrophy. EF 55-60%. Mildly dilated RV with normal systolic function. The aortic valve was calcified and there appeared to be at least moderate stenosis visually but measured mean gradient was only 9 mmHg. Would consider TEE to assess more closely. Mild pulmonary hypertension.  Past Medical History:  Diagnosis Date  . Abnormality of gait 2007  . Acquired cyst of kidney 2002  . Acute sinus infection 11/25/2012  . Allergic rhinitis due to pollen   . Altered mental status 11/28/2012  . Anxiety state, unspecified   . Atrioventricular block, complete ([place]) 2003   s/p PPM 2003, generator chnage 2011  . Cardiac pacemaker in situ 2003  . Coronary atherosclerosis of native coronary artery 2003   non obstructive by Cath 2003  . CVA (cerebral infarction) 06/28/2012   rt hemiparesis, dysphagia  . Dendritic keratitis 2012  . Dysuria   . Edema 2003  . Esophageal dysmotility 08/19/2012  . Food impaction of esophagus 08/15/2012  . HTN (hypertension) 06/28/2012  . Hypertension   . Macular degeneration (senile) of retina, unspecified 2012  .  Malignant neoplasm of breast (female), unspecified site 1970   S/P Rt radical mastectomy  . NSTEMI (non-ST elevated myocardial infarction) ([place]) 06/28/2012  . Osteoarthrosis, unspecified whether generalized or localized, unspecified site 2013  . Pacemaker   . Personal history of colonic polyps     s/p colonoscopy/polypectomy 2003  . Pure hyperglyceridemia 2004  . Senile osteoporosis 2003  . Stroke ([place]) 06/28/2012  .  Tinnitus 2007  . Type II or unspecified type diabetes mellitus without mention of complication, not stated as uncontrolled 2003  . Unspecified arthropathy, pelvic region and thigh 2010  . Unspecified glaucoma(365.9) 2013  . Unspecified hypothyroidism 2009  . Unspecified vitamin D deficiency 2009  . Urinary retention 07/07/2012   Past Surgical History:  Procedure Laterality Date  . BOTOX INJECTION N/A 10/03/2012   Procedure: BOTOX INJECTION;  Surgeon: [name], MD;  [facility];  Service: Endoscopy;  Laterality: N/A;  . ESOPHAGOGASTRODUODENOSCOPY N/A 08/15/2012   Procedure: ESOPHAGOGASTRODUODENOSCOPY (EGD);  Surgeon: [name], MD;  Location: [facility] ENDOSCOPY;  Service: Endoscopy;  Laterality: N/A;  . ESOPHAGOGASTRODUODENOSCOPY N/A 08/15/2012   Procedure: ESOPHAGOGASTRODUODENOSCOPY (EGD);  Surgeon: [name], MD;  Location: [place];  Service: Endoscopy;  Laterality: N/A;  . ESOPHAGOGASTRODUODENOSCOPY N/A 10/03/2012   Procedure: ESOPHAGOGASTRODUODENOSCOPY (EGD);  Surgeon: [name], MD;  Location: [facility];  Service: Endoscopy;  Laterality: N/A;  . ESOPHAGOGASTRODUODENOSCOPY N/A 12/22/2012   Procedure: ESOPHAGOGASTRODUODENOSCOPY (EGD);  Surgeon: [name], MD;  Location: [facility] ENDOSCOPY;  Service: Endoscopy;  Laterality: N/A;  . ESOPHAGOSCOPY N/A 08/15/2012   Procedure: ESOPHAGOSCOPY;  Surgeon: [name], MD;  Location: [place];  Service: ENT;  Laterality: N/A;  . FOREIGN BODY REMOVAL ESOPHAGEAL N/A 08/15/2012   Procedure: REMOVAL FOREIGN BODY ESOPHAGEAL;  Surgeon: [name], MD;  Location: [place];  Service: ENT;  Laterality: N/A;  . INSERT / REPLACE / REMOVE PACEMAKER    . MASTECTOMY Right 1970   Cancer  . SAVORY DILATION N/A 10/03/2012   Procedure: SAVORY DILATION;  Surgeon: [name], MD;  [facility];  Service: Endoscopy;  Laterality: N/A;    Allergies  Allergen Reactions  . Augmentin [Amoxicillin-Pot Clavulanate] Nausea And Vomiting  . Lactose Intolerance  (Gi)     Outpatient Encounter Medications as of 03/14/2018  Medication Sig  . venlafaxine (EFFEXOR) 75 MG tablet Take 75 mg by mouth daily.  Marland Kitchen acetaminophen (TYLENOL) 650 MG CR tablet Take 650 mg by mouth 2 (two) times daily. For pain  . carboxymethylcellulose (REFRESH PLUS) 0.5 % SOLN 2 drops 2 (two) times daily.   . Dentifrices (BIOTENE DRY MOUTH CARE DT) Place 1 spray onto teeth 4 (four) times daily.  . Docosanol (ABREVA) 10 % CREA Apply 1 application topically 5 (five) times daily. To affected area until healed and then once daily as needed  . escitalopram (LEXAPRO) 20 MG tablet Take 10 mg by mouth daily. For depression/ anxiety.  . fexofenadine (ALLEGRA) 180 MG tablet Take 180 mg by mouth daily.  Marland Kitchen GLUCERNA (GLUCERNA) LIQD Take 237 mLs by mouth daily as needed.  Marland Kitchen levothyroxine (SYNTHROID, LEVOTHROID) 75 MCG tablet Take 75 mcg by mouth daily before breakfast. For thyroid therapy  . liver oil-zinc oxide (DESITIN) 40 % ointment Apply 1 application topically as needed for irritation.  Marland Kitchen LORazepam (ATIVAN) 0.5 MG tablet Take 0.25 mg by mouth at bedtime.   Marland Kitchen losartan (COZAAR) 25 MG tablet Take 1 tablet (25 mg total) by mouth daily.  . Melatonin 5 MG TABS  Take 5 tablets by mouth at bedtime.  . memantine (NAMENDA) 10 MG tablet Take 10 mg by mouth 2 (two) times daily.  . metoprolol tartrate (LOPRESSOR) 25 MG tablet Take 25 mg by mouth 2 (two) times daily.  . Multiple Vitamin (MULTIVITAMIN) tablet Take 1 tablet by mouth daily.  . Multiple Vitamins-Minerals (PRESERVISION AREDS PO) Take 1 tablet by mouth 2 (two) times daily.   Marland Kitchen omeprazole (PRILOSEC) 20 MG capsule Take 20 mg by mouth daily.  Addison Lank Hazel 14 % LIQD Apply topically as needed (for hemorrhoid flare up).   No facility-administered encounter medications on file as of 03/14/2018.     Review of Systems  Constitutional: Positive for unexpected weight change. Negative for activity change, appetite change, chills, diaphoresis, fatigue and  fever.  HENT: Negative for congestion.   Respiratory: Negative for cough, shortness of breath and wheezing.   Cardiovascular: Positive for leg swelling. Negative for chest pain and palpitations.  Gastrointestinal: Negative for abdominal distention, abdominal pain, constipation and diarrhea.  Genitourinary: Negative for difficulty urinating and dysuria.  Musculoskeletal: Positive for gait problem. Negative for arthralgias, back pain, joint swelling and myalgias.  Neurological: Positive for weakness. Negative for dizziness, tremors, seizures, syncope, facial asymmetry, speech difficulty, light-headedness, numbness and headaches.  Psychiatric/Behavioral: Positive for confusion. Negative for agitation and behavioral problems.    Immunization History  Administered Date(s) Administered  . Influenza Inj Mdck Quad Pf 12/22/2015  . Influenza Whole 12/17/2012  . Influenza,inj,Quad PF,6+ Mos 12/24/2017  . Influenza-Unspecified 12/08/2013, 12/16/2014, 12/27/2016  . PPD Test 07/18/2012  . Pneumococcal Conjugate-13 08/27/2015   Pertinent  Health Maintenance Due  Topic Date Due  . PNA vac Low Risk Adult (2 of 2 - PPSV23) 08/26/2016  . HEMOGLOBIN A1C  02/08/2018  . FOOT EXAM  06/26/2018  . OPHTHALMOLOGY EXAM  07/04/2018  . INFLUENZA VACCINE  Completed   Fall Risk  07/10/2017 06/27/2016 11/07/2015 01/24/2015  Falls in the past year? No No No No  Risk for fall due to : - - History of fall(s) -   Functional Status Survey:    Vitals:   03/14/18 1147  BP: 126/75  Pulse: 95  Resp: 20  Temp: (!) 97.4 F (36.3 C)  SpO2: 91%  Weight: 200 lb (90.7 kg)   Body mass index is 37.79 kg/m. Physical Exam Vitals signs and nursing note reviewed.  Constitutional:      General: She is not in acute distress.    Appearance: She is obese.  Cardiovascular:     Rate and Rhythm: Normal rate and regular rhythm.     Heart sounds: No murmur.  Pulmonary:     Effort: Pulmonary effort is normal.     Breath  sounds: Normal breath sounds.  Abdominal:     General: Bowel sounds are normal. There is distension.     Palpations: Abdomen is soft. There is no mass.     Tenderness: There is no abdominal tenderness. There is no guarding.     Hernia: No hernia is present.  Musculoskeletal:     Right lower leg: Edema present.     Left lower leg: Edema present.     Comments: Edema 3+ to RUE and BLE  Neurological:     Mental Status: She is alert.     Comments: Right sided weakness Short term memory loss  Psychiatric:        Mood and Affect: Mood normal.     Labs reviewed: Recent Labs    05/06/17 0600  NA 140  K 4.7  BUN 18  CREATININE 0.5   Recent Labs    05/06/17 0600  AST 33  ALT 20  ALKPHOS 52   No results for input(s): WBC, NEUTROABS, HGB, HCT, MCV, PLT in the last 8760 hours. Lab Results  Component Value Date   TSH 2.11 08/27/2017   Lab Results  Component Value Date   HGBA1C 8.3 08/09/2017   Lab Results  Component Value Date   CHOL 135 09/19/2015   HDL 50 09/19/2015   LDLCALC 55 09/19/2015   TRIG 149 09/19/2015   CHOLHDL 4.1 07/01/2012    Significant Diagnostic Results in last 30 days:  No results found.  Assessment/Plan  1. Acute on chronic diastolic CHF (congestive heart failure) (HCC) Lasix 40 mg qd x 3 days Kdur 20 meq x 3 days Reweigh Monday  No CXR or aggressive treament/tests due to goals of care.   2. Depression, recurrent ([place]) Did not tolerate new med D/C effexor Increase Lexapro back to 20 mg qd  Family/ staff Communication: discussed with [name] her daughter.  Labs/tests ordered:  NA

## 2018-03-18 LAB — BASIC METABOLIC PANEL
BUN: 15 (ref 4–21)
Creatinine: 0.5 (ref 0.5–1.1)
Glucose: 150
Potassium: 4.4 (ref 3.4–5.3)
Sodium: 144 (ref 137–147)

## 2018-03-20 ENCOUNTER — Encounter: Payer: Self-pay | Admitting: Internal Medicine

## 2018-03-20 ENCOUNTER — Non-Acute Institutional Stay: Payer: Medicare Other | Admitting: Internal Medicine

## 2018-03-20 VITALS — BP 160/80 | HR 96 | Resp 20 | Ht 61.0 in | Wt 200.0 lb

## 2018-03-20 DIAGNOSIS — Z515 Encounter for palliative care: Secondary | ICD-10-CM | POA: Diagnosis not present

## 2018-03-20 NOTE — Progress Notes (Signed)
Jan 16th, 2020 [facility] Palliative Care Telephone: [phone] Fax: [phone]  PATIENT NAME: Rachel Cooke DOB: Jun 25, 1919 MRN: [id number] [facility]  PRIMARY CARE PROVIDER:  Royal Hawthorn NP ([facility])    [name], DO  REFERRING PROVIDER:  [name], DO [place], [place] [address]  RESPONSIBLE PARTY:(dtr)[place],Sandra [name]).Address: [place], [place] [address]  Home Phone: [phone] Phone: [phone]  HPI / INTERVAL HISTORY:85 yo female with history significant for vascular dementia (MMSE 23 Oct 2017), depression, AV block (PM), CVA (right sided weakness. motorized scooter) DM (not on meds), and intertrigo.This is a f/u Palliative Care visit from 02/07/18. Patient has shown cognitive and functional decline with fluid volume overload. Family wondering if Hospice eligible, with goal of comfort care.  IMPRESSION /RECOMMENDATIONS: 1. Cognitive and functional decline: TC to PCG daughter Verlon Au, who reports over the last month patient has been retaining fluid, with abdominal swelling and marked UE and LE edema. Weight on 03/14/18 was 200lbs, which is an 18lb gain over the 2 weeks prior to this. She was started on Lasix 40mg  qd with K-Dur 20 MEQ. At height of 5'1", her BMI is 34 kg/m2.  She still seems overall edematous. Daughter reports patient with progressive short term memory loss and confusion, and progressive fatigue and activity intolerance. Daughter reports patient no longer has the energy or interest to participate in facility activities. She also was under the impression that patient's pacemaker was approaching EOL (last change out 9 years ago).  On my evaluation and in discussion with nursing staff, patient's cognition seemed at baseline. She was bright and pleasantly conversant. She recalled her son's visit over the holidays. She was unable to tolerate a trial of Effexor for depresssion;  changed back to Lexapro with dose adjusted upwards to 20mg  qd. Staff report patient eats dinner in the dining area. Patient reports she participates in some facility activities, though is more fatigued. She does admit fluid weight gain, and some infrequent dyspnea when excited. Her pulse oximetry reads falsely low, as it is interperting her pulse by 1/2 of it's true rate. She reports oral intake of 50%, and a diminished appetite. She has overall increased body fluid retention, though lung fields are clear. Chronic RUE swelling 2/2 lymphedema after R mastectomy. Worsening bilateral LE edema R>L. She continues dependent for assist with dressing, hygiene, transfers, and toileting.  -Consider increase of Lasix to 80mg  qd, with 40 MEQ potassium supplement. Daily or QOD weights. Target dry weight of 180 lbs.  2. Goals of Care:  Family does not wish any work up or evaluation of current symptoms of CHF. They do not wish to pursue PM batteychange out, or any future hospitalizations. They wish evaluation for hospice eligibility, with goal of comfort care. Medications have been simplified, with discontinuation of MTVs, Melatonin, Allerga, and memantine.  -will follow along to identify if patient becomes hospice eligible. Currently her prognosis appears greater than 6 months.   3. Advanced Care Directives: DNR/MOST form in place.    4. Follow up: A. PC NP visit in 1-2 months.   I spent 7minutes providing this consultation, from3:50pm to 4:50pm. More than 50% of this time was spent coordinating communication, interview of staff, review of chart, and discussions with daughter.   CODE STATUS:DNR, MOST (DNR, Comfort Measures, No Antibiotics, No IVFs, No Tube Feedings).   PPS: 30%. ROS without significant changes.  HOSPICE ELIGIBILITY:No, as prognosis thought greater than 6 months.  PAST MEDICAL HISTORY:  Past  Medical History:  Diagnosis Date  . Abnormality of gait 2007  . Acquired cyst  of kidney 2002  . Acute sinus infection 11/25/2012  . Allergic rhinitis due to pollen   . Altered mental status 11/28/2012  . Anxiety state, unspecified   . Atrioventricular block, complete ([place]) 2003   s/p PPM 2003, generator chnage 2011  . Cardiac pacemaker in situ 2003  . Coronary atherosclerosis of native coronary artery 2003   non obstructive by Cath 2003  . CVA (cerebral infarction) 06/28/2012   rt hemiparesis, dysphagia  . Dendritic keratitis 2012  . Dysuria   . Edema 2003  . Esophageal dysmotility 08/19/2012  . Food impaction of esophagus 08/15/2012  . HTN (hypertension) 06/28/2012  . Hypertension   . Macular degeneration (senile) of retina, unspecified 2012  . Malignant neoplasm of breast (female), unspecified site 1970   S/P Rt radical mastectomy  . NSTEMI (non-ST elevated myocardial infarction) ([place]) 06/28/2012  . Osteoarthrosis, unspecified whether generalized or localized, unspecified site 2013  . Pacemaker   . Personal history of colonic polyps     s/p colonoscopy/polypectomy 2003  . Pure hyperglyceridemia 2004  . Senile osteoporosis 2003  . Stroke ([place]) 06/28/2012  . Tinnitus 2007  . Type II or unspecified type diabetes mellitus without mention of complication, not stated as uncontrolled 2003  . Unspecified arthropathy, pelvic region and thigh 2010  . Unspecified glaucoma(365.9) 2013  . Unspecified hypothyroidism 2009  . Unspecified vitamin D deficiency 2009  . Urinary retention 07/07/2012    SOCIAL HX:  Social History   Tobacco Use  . Smoking status: Former Smoker    Packs/day: 0.25    Years: 3.00    Pack years: 0.75  . Smokeless tobacco: Never Used  Substance Use Topics  . Alcohol use: No    ALLERGIES:  Allergies  Allergen Reactions  . Augmentin [Amoxicillin-Pot Clavulanate] Nausea And Vomiting  . Lactose Intolerance (Gi)      PERTINENT MEDICATIONS:  Outpatient Encounter Medications as of 03/20/2018  Medication Sig  . acetaminophen (TYLENOL) 650 MG CR  tablet Take 650 mg by mouth 2 (two) times daily. For pain  . carboxymethylcellulose (REFRESH PLUS) 0.5 % SOLN 2 drops 2 (two) times daily.   . Dentifrices (BIOTENE DRY MOUTH CARE DT) Place 1 spray onto teeth 4 (four) times daily.  . Docosanol (ABREVA) 10 % CREA Apply 1 application topically 5 (five) times daily. To affected area until healed and then once daily as needed  . escitalopram (LEXAPRO) 20 MG tablet Take 10 mg by mouth daily. For depression/ anxiety.  . fexofenadine (ALLEGRA) 180 MG tablet Take 180 mg by mouth daily.  Marland Kitchen GLUCERNA (GLUCERNA) LIQD Take 237 mLs by mouth daily as needed.  Marland Kitchen levothyroxine (SYNTHROID, LEVOTHROID) 75 MCG tablet Take 75 mcg by mouth daily before breakfast. For thyroid therapy  . liver oil-zinc oxide (DESITIN) 40 % ointment Apply 1 application topically as needed for irritation.  Marland Kitchen LORazepam (ATIVAN) 0.5 MG tablet Take 0.25 mg by mouth at bedtime.   Marland Kitchen losartan (COZAAR) 25 MG tablet Take 1 tablet (25 mg total) by mouth daily.  . Melatonin 5 MG TABS Take 5 tablets by mouth at bedtime.  . memantine (NAMENDA) 10 MG tablet Take 10 mg by mouth 2 (two) times daily.  . metoprolol tartrate (LOPRESSOR) 25 MG tablet Take 25 mg by mouth 2 (two) times daily.  . Multiple Vitamin (MULTIVITAMIN) tablet Take 1 tablet by mouth daily.  . Multiple Vitamins-Minerals (PRESERVISION AREDS PO) Take  1 tablet by mouth 2 (two) times daily.   Marland Kitchen omeprazole (PRILOSEC) 20 MG capsule Take 20 mg by mouth daily.  Marland Kitchen venlafaxine (EFFEXOR) 75 MG tablet Take 75 mg by mouth daily.  Addison Lank Hazel 14 % LIQD Apply topically as needed (for hemorrhoid flare up).   No facility-administered encounter medications on file as of 03/20/2018.    PHYSICAL EXAM:  VS:160/80, HR 96, RR 20 Well nourished, pleasantly conversant elderly Caucasian female looking younger than her 56 years. She is alert and engaging. She has increased LE edema since my last visit 5 weeks ago. Cardiovascular: regular rate and rhythm.  Soft systolic murmur. Atrial gallop Pulmonary: bibasilar very soft inspiratory crackles; distant lung sounds Abdomen: soft, nontender, + bowel sounds Extremities: bilateral LE softly pitting edema 3/4 up calves, non-weeping. R>L Skin: no rashes Neurological:  nonfocal; generalized weakness.  Julianne Handler, NP

## 2018-03-24 ENCOUNTER — Encounter: Payer: Self-pay | Admitting: Adult Health

## 2018-03-24 ENCOUNTER — Non-Acute Institutional Stay (SKILLED_NURSING_FACILITY): Payer: Medicare Other | Admitting: Adult Health

## 2018-03-24 DIAGNOSIS — F0150 Vascular dementia without behavioral disturbance: Secondary | ICD-10-CM | POA: Diagnosis not present

## 2018-03-24 DIAGNOSIS — I5031 Acute diastolic (congestive) heart failure: Secondary | ICD-10-CM | POA: Diagnosis not present

## 2018-03-24 MED ORDER — LORAZEPAM 0.5 MG PO TABS: 0.2500 mg | ORAL_TABLET | Freq: Three times a day (TID) | ORAL | 1 refills | Status: DC | PRN

## 2018-03-24 NOTE — Progress Notes (Signed)
Location:  Occupational psychologist of Service:  SNF (31) Provider:   Cindi Carbon, ANP [place] [phone]   [name], DO  Patient Care Team: [name], DO as PCP - General (Geriatric Medicine) [name], Altamese Dilling, MD as Attending Physician (Gastroenterology) Garvin Fila, MD as Consulting Physician (Neurology) Julianne Handler, NP as Nurse Practitioner [facility])  Extended Emergency Contact Information Primary Emergency Contact: [place] Address: [address]          [place], [place] [address] [name] of [place] Phone: [phone] Mobile Phone: [phone] Relation: Daughter  Code Status:  DNR Goals of care: Advanced Directive information Advanced Directives 07/30/2017  Does Patient Have a Medical Advance Directive? Yes  Type of Paramedic of [place];Living will;Out of facility DNR (pink MOST or yellow form)  Does patient want to make changes to medical advance directive? No - Patient declined  Copy of [place] in Chart? Yes  Pre-existing out of facility DNR order (yellow form or pink MOST form) Yellow form placed in chart (order not valid for inpatient use);Pink MOST form placed in chart (order not valid for inpatient use)     Chief Complaint  Patient presents with  . Acute Visit    weight gain    HPI:  Pt is a 85 y.o. female seen today for follow up regarding weight gain. She has lost 5 lbs since starting Lasix 40 mg daily 11 days ago. Her edema is slightly improved. She gained 20 lbs in less than a month which necessitated treatment. She denies any sob or cp. No DOE or PND. She reports fatigue and periods of anxiety. She continues to have slight cyanosis to her finger tips. Sats remain WNL. She does not want any aggressive care. Her nurse states that she becomes very fatigued during her morning routine and is  frustrated with herself and the situation. She has refused to lie down to facility removal of fluid. She is followed by Palliative care and she was seen on 1/16 . Notes indicate that she is still not appropriate for hospice.  Wt Readings from Last 3 Encounters:  03/24/18 195 lb (88.5 kg)  03/20/18 200 lb (90.7 kg)  03/14/18 200 lb (90.7 kg)   She has a most form indicating comfort care only with no labs, IV fluid, tube feeding, or antibiotics. She is a DNR.   2D echo 02/15/14 Impressions:  - Normal LV size with mild LV hypertrophy. EF 55-60%. Mildly dilated RV with normal systolic function. The aortic valve was calcified and there appeared to be at least moderate stenosis visually but measured mean gradient was only 9 mmHg. Would consider TEE to assess more closely. Mild pulmonary hypertension.  Past Medical History:  Diagnosis Date  . Abnormality of gait 2007  . Acquired cyst of kidney 2002  . Acute sinus infection 11/25/2012  . Allergic rhinitis due to pollen   . Altered mental status 11/28/2012  . Anxiety state, unspecified   . Atrioventricular block, complete ([place]) 2003   s/p PPM 2003, generator chnage 2011  . Cardiac pacemaker in situ 2003  . Coronary atherosclerosis of native coronary artery 2003   non obstructive by Cath 2003  . CVA (cerebral infarction) 06/28/2012   rt hemiparesis, dysphagia  . Dendritic keratitis 2012  . Dysuria   . Edema 2003  . Esophageal dysmotility 08/19/2012  . Food impaction of esophagus 08/15/2012  .  HTN (hypertension) 06/28/2012  . Hypertension   . Macular degeneration (senile) of retina, unspecified 2012  . Malignant neoplasm of breast (female), unspecified site 1970   S/P Rt radical mastectomy  . NSTEMI (non-ST elevated myocardial infarction) ([place]) 06/28/2012  . Osteoarthrosis, unspecified whether generalized or localized, unspecified site 2013  . Pacemaker   . Personal history of colonic polyps     s/p colonoscopy/polypectomy 2003    . Pure hyperglyceridemia 2004  . Senile osteoporosis 2003  . Stroke ([place]) 06/28/2012  . Tinnitus 2007  . Type II or unspecified type diabetes mellitus without mention of complication, not stated as uncontrolled 2003  . Unspecified arthropathy, pelvic region and thigh 2010  . Unspecified glaucoma(365.9) 2013  . Unspecified hypothyroidism 2009  . Unspecified vitamin D deficiency 2009  . Urinary retention 07/07/2012   Past Surgical History:  Procedure Laterality Date  . BOTOX INJECTION N/A 10/03/2012   Procedure: BOTOX INJECTION;  Surgeon: [name], MD;  Location: [facility];  Service: Endoscopy;  Laterality: N/A;  . ESOPHAGOGASTRODUODENOSCOPY N/A 08/15/2012   Procedure: ESOPHAGOGASTRODUODENOSCOPY (EGD);  Surgeon: [name], MD;  Location: [facility] ENDOSCOPY;  Service: Endoscopy;  Laterality: N/A;  . ESOPHAGOGASTRODUODENOSCOPY N/A 08/15/2012   Procedure: ESOPHAGOGASTRODUODENOSCOPY (EGD);  Surgeon: [name], MD;  Location: [facility];  Service: Endoscopy;  Laterality: N/A;  . ESOPHAGOGASTRODUODENOSCOPY N/A 10/03/2012   Procedure: ESOPHAGOGASTRODUODENOSCOPY (EGD);  Surgeon: [name], MD;  Location: [facility];  Service: Endoscopy;  Laterality: N/A;  . ESOPHAGOGASTRODUODENOSCOPY N/A 12/22/2012   Procedure: ESOPHAGOGASTRODUODENOSCOPY (EGD);  Surgeon: [name], MD;  Location: [facility] ENDOSCOPY;  Service: Endoscopy;  Laterality: N/A;  . ESOPHAGOSCOPY N/A 08/15/2012   Procedure: ESOPHAGOSCOPY;  Surgeon: [name], MD;  Location: [facility];  Service: ENT;  Laterality: N/A;  . FOREIGN BODY REMOVAL ESOPHAGEAL N/A 08/15/2012   Procedure: REMOVAL FOREIGN BODY ESOPHAGEAL;  Surgeon: [name], MD;  Location: [facility];  Service: ENT;  Laterality: N/A;  . INSERT / REPLACE / REMOVE PACEMAKER    . MASTECTOMY Right 1970   Cancer  . SAVORY DILATION N/A 10/03/2012   Procedure: SAVORY DILATION;  Surgeon: [name], MD;  Location: [facility];  Service: Endoscopy;  Laterality: N/A;    Allergies  Allergen  Reactions  . Augmentin [Amoxicillin-Pot Clavulanate] Nausea And Vomiting  . Lactose Intolerance (Gi)     Outpatient Encounter Medications as of 03/24/2018  Medication Sig  . LORazepam (ATIVAN) 0.5 MG tablet Take 0.5 tablets (0.25 mg total) by mouth 3 (three) times daily as needed for anxiety.  . [DISCONTINUED] LORazepam (ATIVAN) 0.5 MG tablet Take 0.25 mg by mouth 3 (three) times daily as needed for anxiety.  Marland Kitchen acetaminophen (TYLENOL) 650 MG CR tablet Take 650 mg by mouth 2 (two) times daily. For pain  . carboxymethylcellulose (REFRESH PLUS) 0.5 % SOLN 2 drops 2 (two) times daily.   . Dentifrices (BIOTENE DRY MOUTH CARE DT) Place 1 spray onto teeth 4 (four) times daily.  Marland Kitchen escitalopram (LEXAPRO) 20 MG tablet Take 10 mg by mouth daily. For depression/ anxiety.  . furosemide (LASIX) 40 MG tablet Take 40 mg by mouth.  Marland Kitchen GLUCERNA (GLUCERNA) LIQD Take 237 mLs by mouth daily as needed.  Marland Kitchen levothyroxine (SYNTHROID, LEVOTHROID) 75 MCG tablet Take 75 mcg by mouth daily before breakfast. For thyroid therapy  . liver oil-zinc oxide (DESITIN) 40 % ointment Apply 1 application topically as needed for irritation.  Marland Kitchen LORazepam (ATIVAN) 0.5 MG tablet Take 0.25 mg by mouth at bedtime.   Marland Kitchen  losartan (COZAAR) 25 MG tablet Take 1 tablet (25 mg total) by mouth daily.  . metoprolol tartrate (LOPRESSOR) 25 MG tablet Take 25 mg by mouth 2 (two) times daily.  Marland Kitchen omeprazole (PRILOSEC) 20 MG capsule Take 20 mg by mouth daily.  . potassium chloride SA (K-DUR,KLOR-CON) 20 MEQ tablet Take 20 mEq by mouth daily.  Addison Lank Hazel 14 % LIQD Apply topically as needed (for hemorrhoid flare up).   No facility-administered encounter medications on file as of 03/24/2018.     Review of Systems  Constitutional: Positive for malaise/fatigue.  Respiratory: Negative for cough, sputum production, shortness of breath and wheezing.   Cardiovascular: Positive for leg swelling. Negative for chest pain, palpitations, orthopnea, claudication  and PND.  Genitourinary: Negative for dysuria and urgency.  Musculoskeletal: Negative for falls and joint pain.  Neurological: Positive for focal weakness. Negative for seizures and loss of consciousness.  Psychiatric/Behavioral: Positive for depression and memory loss. Negative for hallucinations, substance abuse and suicidal ideas. The patient is nervous/anxious. The patient does not have insomnia.       Immunization History  Administered Date(s) Administered  . Influenza Inj Mdck Quad Pf 12/22/2015  . Influenza Whole 12/17/2012  . Influenza,inj,Quad PF,6+ Mos 12/24/2017  . Influenza-Unspecified 12/08/2013, 12/16/2014, 12/27/2016  . PPD Test 07/18/2012  . Pneumococcal Conjugate-13 08/27/2015   Pertinent  Health Maintenance Due  Topic Date Due  . PNA vac Low Risk Adult (2 of 2 - PPSV23) 08/26/2016  . HEMOGLOBIN A1C  02/08/2018  . FOOT EXAM  06/26/2018  . OPHTHALMOLOGY EXAM  07/04/2018  . INFLUENZA VACCINE  Completed   Fall Risk  07/10/2017 06/27/2016 11/07/2015 01/24/2015  Falls in the past year? No No No No  Risk for fall due to : - - History of fall(s) -   Functional Status Survey:    Vitals:   03/24/18 1554  BP: 118/67  Pulse: 88  Resp: 20  Temp: 98.1 F (36.7 C)  SpO2: 91%  Weight: 195 lb (88.5 kg)   Body mass index is 36.84 kg/m.  Physical Exam  Constitutional: She is oriented to person, place, and time. No distress.  HENT:  Head: Normocephalic and atraumatic.  Nose: Nose normal.  Mouth/Throat: No oropharyngeal exudate.  Neck: No JVD present.  Cardiovascular: Normal rate and regular rhythm.  No murmur heard. Pulmonary/Chest: Effort normal. No respiratory distress. She has no wheezes. She has rales.  Abdominal: Soft. Bowel sounds are normal. She exhibits no distension. There is no abdominal tenderness.  Musculoskeletal:        General: Edema (BLE +2, RUE +1) present.  Neurological: She is alert and oriented to person, place, and time.  Skin: Skin is warm  and dry. She is not diaphoretic.  Psychiatric: She has a normal mood and affect.    Labs reviewed: Recent Labs    05/06/17 0600  NA 140  K 4.7  BUN 18  CREATININE 0.5   Recent Labs    05/06/17 0600  AST 33  ALT 20  ALKPHOS 52   No results for input(s): WBC, NEUTROABS, HGB, HCT, MCV, PLT in the last 8760 hours. Lab Results  Component Value Date   TSH 2.11 08/27/2017   Lab Results  Component Value Date   HGBA1C 8.3 08/09/2017   Lab Results  Component Value Date   CHOL 135 09/19/2015   HDL 50 09/19/2015   LDLCALC 55 09/19/2015   TRIG 149 09/19/2015   CHOLHDL 4.1 07/01/2012    Significant Diagnostic Results in  last 30 days:  No results found.  Assessment/Plan  1. Acute on chronic diastolic CHF (congestive heart failure) (HCC) Slight improvement in edema noted. Continues with cyanosis to fingers but no sob. Reporting more fatigue and feelings of anxiety.  Increase lasix 40 mg bid  Increase Kdur 40 meq qd Weights Monday Wed Fri Ativan 0.25 mg tid prn for feelings of sob or anxiety  Recommend that she take periods of rest during the day in the bed or chair. She should try to conserve energy and allow staff to assist her more with her morning routine.   2. Vascular dementia Memory loss progressing. Her goals of care are comfort based. At her recent care plan meeting her namenda and several other non essential meds were discontinued.   Family/ staff Communication: discussed with her nurse Labs/tests ordered:  NA

## 2018-04-01 ENCOUNTER — Encounter: Payer: Self-pay | Admitting: Internal Medicine

## 2018-04-01 ENCOUNTER — Non-Acute Institutional Stay (SKILLED_NURSING_FACILITY): Payer: Medicare Other | Admitting: Internal Medicine

## 2018-04-01 DIAGNOSIS — I69351 Hemiplegia and hemiparesis following cerebral infarction affecting right dominant side: Secondary | ICD-10-CM

## 2018-04-01 DIAGNOSIS — I442 Atrioventricular block, complete: Secondary | ICD-10-CM

## 2018-04-01 DIAGNOSIS — K224 Dyskinesia of esophagus: Secondary | ICD-10-CM

## 2018-04-01 DIAGNOSIS — E1142 Type 2 diabetes mellitus with diabetic polyneuropathy: Secondary | ICD-10-CM

## 2018-04-01 DIAGNOSIS — Z23 Encounter for immunization: Secondary | ICD-10-CM

## 2018-04-01 DIAGNOSIS — F0150 Vascular dementia without behavioral disturbance: Secondary | ICD-10-CM

## 2018-04-01 DIAGNOSIS — F4321 Adjustment disorder with depressed mood: Secondary | ICD-10-CM

## 2018-04-01 NOTE — Progress Notes (Signed)
Patient ID: Rachel Cooke, female   DOB: 10-Nov-1919, 85 y.o.   MRN: [id number]  Location:  [place] Room Number: [place] of Service:  SNF ([phone]) Provider:   [name], DO  Patient Care Team: [name], DO as PCP - General (Geriatric Medicine) Community, Well Gwenette Greet, MD as Attending Physician (Gastroenterology) Garvin Fila, MD as Consulting Physician (Neurology) Julianne Handler, NP as Nurse Practitioner [facility] and Palliative Medicine)  Extended Emergency Contact Information Primary Emergency Contact: [place] Address: [address]          [place], [place] [address] [name] of [place] Phone: [phone] Mobile Phone: [phone] Relation: Daughter  Code Status:  DNR, palliative care Goals of care: Advanced Directive information Advanced Directives 04/01/2018  Does Patient Have a Medical Advance Directive? Yes  Type of Paramedic of [place];Living will;Out of facility DNR (pink MOST or yellow form)  Does patient want to make changes to medical advance directive? No - Patient declined  Copy of [place] in Chart? Yes - validated most recent copy scanned in chart (See row information)  Pre-existing out of facility DNR order (yellow form or pink MOST form) Yellow form placed in chart (order not valid for inpatient use);Pink MOST form placed in chart (order not valid for inpatient use)     Chief Complaint  Patient presents with  . Medical Management of Chronic Issues    Routine Visit    HPI:  Pt is a 85 y.o. female seen today for medical management of chronic diseases.  She has a h/o prior stroke with right hemiparesis, edema, DMII, complete AV block, dysphagia with esophageal dysmotility and prior impactions, longstanding depression over much of her life, gradually progressive dementia especially over the past 2 years.    When seen today,  she reported her usual complaint of "mostly eating baby food" when she prefers more solids, but she becomes very ill and impaction has required endoscopic intervention in the past so she has just some minor liberalization of her diet to make her more content but not put her at too much risk.    Spirits seem to be improved vs last visit I had with her.    Edema of her stroke-affected side persists vs the left side.    Chart indicates she is not up to date on her pneumovax 23 or her tdap.  Family may have opted out of this.   Past Medical History:  Diagnosis Date  . Abnormality of gait 2007  . Acquired cyst of kidney 2002  . Acute sinus infection 11/25/2012  . Allergic rhinitis due to pollen   . Altered mental status 11/28/2012  . Anxiety state, unspecified   . Atrioventricular block, complete ([place]) 2003   s/p PPM 2003, generator chnage 2011  . Cardiac pacemaker in situ 2003  . Coronary atherosclerosis of native coronary artery 2003   non obstructive by Cath 2003  . CVA (cerebral infarction) 06/28/2012   rt hemiparesis, dysphagia  . Dendritic keratitis 2012  . Dysuria   . Edema 2003  . Esophageal dysmotility 08/19/2012  . Food impaction of esophagus 08/15/2012  . HTN (hypertension) 06/28/2012  . Hypertension   . Macular degeneration (senile) of retina, unspecified 2012  . Malignant neoplasm of breast (female), unspecified site 1970   S/P Rt radical mastectomy  . NSTEMI (non-ST elevated myocardial infarction) ([place]) 06/28/2012  . Osteoarthrosis, unspecified whether generalized or localized, unspecified  site 2013  . Pacemaker   . Personal history of colonic polyps     s/p colonoscopy/polypectomy 2003  . Pure hyperglyceridemia 2004  . Senile osteoporosis 2003  . Stroke ([place]) 06/28/2012  . Tinnitus 2007  . Type II or unspecified type diabetes mellitus without mention of complication, not stated as uncontrolled 2003  . Unspecified arthropathy, pelvic region and thigh 2010  . Unspecified  glaucoma(365.9) 2013  . Unspecified hypothyroidism 2009  . Unspecified vitamin D deficiency 2009  . Urinary retention 07/07/2012   Past Surgical History:  Procedure Laterality Date  . BOTOX INJECTION N/A 10/03/2012   Procedure: BOTOX INJECTION;  Surgeon: [name], MD;  [facility]: [facility] ENDOSCOPY;  Service: Endoscopy;  Laterality: N/A;  . ESOPHAGOGASTRODUODENOSCOPY N/A 08/15/2012   Procedure: ESOPHAGOGASTRODUODENOSCOPY (EGD);  Surgeon: [name], MD;  Location: [facility] ENDOSCOPY;  Service: Endoscopy;  Laterality: N/A;  . ESOPHAGOGASTRODUODENOSCOPY N/A 08/15/2012   Procedure: ESOPHAGOGASTRODUODENOSCOPY (EGD);  Surgeon: [name], MD;  Location: [place];  Service: Endoscopy;  Laterality: N/A;  . ESOPHAGOGASTRODUODENOSCOPY N/A 10/03/2012   Procedure: ESOPHAGOGASTRODUODENOSCOPY (EGD);  Surgeon: [name], MD;  Location: [facility];  Service: Endoscopy;  Laterality: N/A;  . ESOPHAGOGASTRODUODENOSCOPY N/A 12/22/2012   Procedure: ESOPHAGOGASTRODUODENOSCOPY (EGD);  Surgeon: [name], MD;  Location: [facility] ENDOSCOPY;  Service: Endoscopy;  Laterality: N/A;  . ESOPHAGOSCOPY N/A 08/15/2012   Procedure: ESOPHAGOSCOPY;  Surgeon: [name], MD;  Location: [place];  Service: ENT;  Laterality: N/A;  . FOREIGN BODY REMOVAL ESOPHAGEAL N/A 08/15/2012   Procedure: REMOVAL FOREIGN BODY ESOPHAGEAL;  Surgeon: [name], MD;  Location: [place];  Service: ENT;  Laterality: N/A;  . INSERT / REPLACE / REMOVE PACEMAKER    . MASTECTOMY Right 1970   Cancer  . SAVORY DILATION N/A 10/03/2012   Procedure: SAVORY DILATION;  Surgeon: [name], MD;  [facility];  Service: Endoscopy;  Laterality: N/A;    Allergies  Allergen Reactions  . Augmentin [Amoxicillin-Pot Clavulanate] Nausea And Vomiting  . Lactose Intolerance (Gi)     Outpatient Encounter Medications as of 04/01/2018  Medication Sig  . acetaminophen (TYLENOL) 650 MG CR tablet Take 650 mg by mouth 2 (two) times daily. For pain  . carboxymethylcellulose  (REFRESH PLUS) 0.5 % SOLN 2 drops 2 (two) times daily.   . Dentifrices (BIOTENE DRY MOUTH CARE DT) Place 1 spray onto teeth 4 (four) times daily.  . Docosanol (ABREVA) 10 % CREA Apply topically as needed.  Marland Kitchen escitalopram (LEXAPRO) 20 MG tablet Take 10 mg by mouth daily. For depression/ anxiety.  . furosemide (LASIX) 40 MG tablet Take 40 mg by mouth.  Marland Kitchen GLUCERNA (GLUCERNA) LIQD Take 237 mLs by mouth daily as needed.  Marland Kitchen levothyroxine (SYNTHROID, LEVOTHROID) 75 MCG tablet Take 75 mcg by mouth daily before breakfast. For thyroid therapy  . liver oil-zinc oxide (DESITIN) 40 % ointment Apply 1 application topically as needed for irritation.  Marland Kitchen LORazepam (ATIVAN) 0.5 MG tablet Take 0.25 mg by mouth at bedtime.   Marland Kitchen LORazepam (ATIVAN) 0.5 MG tablet Take 0.5 tablets (0.25 mg total) by mouth 3 (three) times daily as needed for anxiety.  Marland Kitchen losartan (COZAAR) 25 MG tablet Take 1 tablet (25 mg total) by mouth daily.  . metoprolol tartrate (LOPRESSOR) 25 MG tablet Take 25 mg by mouth 2 (two) times daily.  Marland Kitchen omeprazole (PRILOSEC) 20 MG capsule Take 20 mg by mouth daily.  . potassium chloride SA (K-DUR,KLOR-CON) 20 MEQ tablet Take 20 mEq by mouth  daily.  . [place] Hazel 14 % LIQD Apply topically as needed (for hemorrhoid flare up).   No facility-administered encounter medications on file as of 04/01/2018.     Review of Systems  Constitutional: Positive for fatigue. Negative for activity change, appetite change, chills and fever.  HENT: Positive for hearing loss and trouble swallowing. Negative for congestion.   Eyes: Negative for visual disturbance.       Glasses  Respiratory: Negative for cough, choking, shortness of breath and wheezing.   Cardiovascular: Positive for leg swelling. Negative for chest pain and palpitations.  Gastrointestinal: Negative for abdominal pain, constipation and diarrhea.  Genitourinary: Negative for frequency.  Musculoskeletal: Positive for arthralgias and gait problem.    Allergic/Immunologic:       DM  Neurological: Positive for weakness. Negative for dizziness.  Hematological: Bruises/bleeds easily.  Psychiatric/Behavioral: Positive for confusion. Negative for agitation, behavioral problems, sleep disturbance and suicidal ideas.    Immunization History  Administered Date(s) Administered  . Influenza Inj Mdck Quad Pf 12/22/2015  . Influenza Whole 12/17/2012  . Influenza,inj,Quad PF,6+ Mos 12/24/2017  . Influenza-Unspecified 12/08/2013, 12/16/2014, 12/27/2016  . PPD Test 07/18/2012  . Pneumococcal Conjugate-13 08/27/2015   Pertinent  Health Maintenance Due  Topic Date Due  . PNA vac Low Risk Adult (2 of 2 - PPSV23) 08/26/2016  . HEMOGLOBIN A1C  02/08/2018  . FOOT EXAM  06/26/2018  . OPHTHALMOLOGY EXAM  07/04/2018  . INFLUENZA VACCINE  Completed   Fall Risk  07/10/2017 06/27/2016 11/07/2015 01/24/2015  Falls in the past year? No No No No  Risk for fall due to : - - History of fall(s) -   Functional Status Survey:    Vitals:   04/01/18 1346  BP: 137/80  Pulse: 64  Resp: 20  Temp: 97.6 F (36.4 C)  TempSrc: Oral  SpO2: 96%  Weight: 188 lb (85.3 kg)  Height: 5\' 1"  (1.549 m)   Body mass index is 35.52 kg/m. Physical Exam Constitutional:      General: She is not in acute distress.    Appearance: Normal appearance. She is obese. She is not toxic-appearing.  HENT:     Head: Normocephalic and atraumatic.  Eyes:     Comments: glasses  Cardiovascular:     Rate and Rhythm: Normal rate and regular rhythm.     Pulses: Normal pulses.     Heart sounds: Normal heart sounds.  Pulmonary:     Effort: Pulmonary effort is normal.     Breath sounds: No wheezing or rhonchi.  Abdominal:     General: Bowel sounds are normal.     Palpations: Abdomen is soft.  Musculoskeletal:     Right lower leg: Edema present.     Left lower leg: Edema present.  Skin:    Capillary Refill: Capillary refill takes less than 2 seconds.     Findings: Bruising  present.  Neurological:     General: No focal deficit present.     Mental Status: She is alert.     Cranial Nerves: No cranial nerve deficit.     Comments: Oriented to person, place, not time and has short term memory loss; hemiparesis  Psychiatric:        Mood and Affect: Mood normal.     Labs reviewed: Recent Labs    05/06/17 0600 03/18/18 0600  NA 140 144  K 4.7 4.4  BUN 18 15  CREATININE 0.5 0.5   Recent Labs    05/06/17 0600  AST 33  ALT 20  ALKPHOS 52   No results for input(s): WBC, NEUTROABS, HGB, HCT, MCV, PLT in the last 8760 hours. Lab Results  Component Value Date   TSH 2.11 08/27/2017   Lab Results  Component Value Date   HGBA1C 7.6 02/11/2018   Lab Results  Component Value Date   CHOL 135 09/19/2015   HDL 50 09/19/2015   LDLCALC 55 09/19/2015   TRIG 149 09/19/2015   CHOLHDL 4.1 07/01/2012    Assessment/Plan 1. Hemiparesis affecting right side as late effect of cerebrovascular accident ([place]) -ongoing, cont SNF support, hoyer transfers  2. Controlled type 2 diabetes mellitus with diabetic polyneuropathy, without long-term current use of insulin ([place]) Lab Results  Component Value Date   HGBA1C 7.6 02/11/2018  really with her palliative goals, we can avoid regular checks of this going forward  3. Atrioventricular block, complete ([facility]) -chronic, s/p pacemaker in 2003, generator changed in 2011, cont to monitor  4. Vascular dementia without behavioral disturbance ([place]) -progressing considerably the past two years, short term memory quite poor now -scored better than expected in Jan last year MMSE - [place] Exam 03/13/2017 06/27/2016  Orientation to time 5 4  Orientation to Place 4 3  Registration 1 3  Attention/ Calculation 5 5  Recall 1 0  Language- name 2 objects 2 2  Language- repeat 1 1  Language- follow 3 step command 3 2  Language- read & follow direction 1 1  Write a sentence 1 1  Copy design 1 1  Total score 25 23   5.  Situational depression -has been lifelong issue per family, has been miserable about her diet restrictions -cont lexapro for depression with ativan daily at hs and tid prn anxiety episodes or panic attacks  6. Esophageal dysmotility -ongoing for many years, does get a few pleasure foods but is mostly on "baby food" as she calls it; cont glucerna supplement if intake poor, but weight remains obese at 188 lb with height of 5 ft 1 in  7. Need for 23-polyvalent pneumococcal polysaccharide vaccine -recommend she get pneumonia vaccine--appears she only had prevnar 13--I recommend this despite her palliative goals due to preventing spread of infection in SNF community also, plus shot is a benign procedure  Family/ staff Communication: discussed with SNF nurse  Labs/tests ordered:  No new

## 2018-04-25 ENCOUNTER — Non-Acute Institutional Stay (SKILLED_NURSING_FACILITY): Payer: Medicare Other | Admitting: Adult Health

## 2018-04-25 ENCOUNTER — Encounter: Payer: Self-pay | Admitting: Adult Health

## 2018-04-25 DIAGNOSIS — K224 Dyskinesia of esophagus: Secondary | ICD-10-CM | POA: Diagnosis not present

## 2018-04-25 DIAGNOSIS — I10 Essential (primary) hypertension: Secondary | ICD-10-CM | POA: Diagnosis not present

## 2018-04-25 DIAGNOSIS — E039 Hypothyroidism, unspecified: Secondary | ICD-10-CM | POA: Diagnosis not present

## 2018-04-25 DIAGNOSIS — I5032 Chronic diastolic (congestive) heart failure: Secondary | ICD-10-CM

## 2018-04-25 DIAGNOSIS — E118 Type 2 diabetes mellitus with unspecified complications: Secondary | ICD-10-CM

## 2018-04-25 DIAGNOSIS — I5030 Unspecified diastolic (congestive) heart failure: Secondary | ICD-10-CM | POA: Insufficient documentation

## 2018-04-25 NOTE — Progress Notes (Signed)
Location:  [facility]up[facility] of Service:  SNF (31) Provider:   Cindi Carbon, ANP [place] [phone]   [name], DO  Patient Care Team: [name], DO as PCP - General (Geriatric Medicine) Community, Well [name], Altamese Dilling, MD as Attending Physician (Gastroenterology) Garvin Fila, MD as Consulting Physician (Neurology) Julianne Handler, NP as Nurse Practitioner [facility] and Palliative Medicine)  Extended Emergency Contact Information Primary Emergency Contact: [place] Address: [address]          [place],  [address] [name] of [place] Phone: [phone] Mobile Phone: [phone] Relation: Daughter  Code Status:  DNR Goals of care: Advanced Directive information Advanced Directives 04/01/2018  Does Patient Have a Medical Advance Directive? Yes  Type of Paramedic of [place];Living will;Out of facility DNR (pink MOST or yellow form)  Does patient want to make changes to medical advance directive? No - Patient declined  Copy of [place] in Chart? Yes - validated most recent copy scanned in chart (See row information)  Pre-existing out of facility DNR order (yellow form or pink MOST form) Yellow form placed in chart (order not valid for inpatient use);Pink MOST form placed in chart (order not valid for inpatient use)     Chief Complaint  Patient presents with  . Medical Management of Chronic Issues    HPI:  Pt is a 85 y.o. female seen today for medical management of chronic diseases.   Recently she has had issues weight gain and blue discoloration to her fingers. Her weight/edema responded well to lasix and she is currently on 40 mg qd. Fingers continue to be blue but no pain, numbness, and sats are WNL. Her goals of care are comfort based. Her family does not want any life saving intervention. She had an echo in 2015 showing an EF of  50-55% with grade 1DD.  She has a pacemaker with recent check in dec of 2015 indicating good battery function. She has a hx of CVA with right sided weakness and she uses a motorized chair to get around the facility. Upon my visit she is eating gold fish and chocolate despite being recommended other wise due to a hx of dysphagia and esophageal impaction. She denies any issues with sob, cough, choking, cp, indigestion etc.  Functional status: hoyer lift, incontinent Past Medical History:  Diagnosis Date  . Abnormality of gait 2007  . Acquired cyst of kidney 2002  . Acute sinus infection 11/25/2012  . Allergic rhinitis due to pollen   . Altered mental status 11/28/2012  . Anxiety state, unspecified   . Atrioventricular block, complete ([place]) 2003   s/p PPM 2003, generator chnage 2011  . Cardiac pacemaker in situ 2003  . Coronary atherosclerosis of native coronary artery 2003   non obstructive by Cath 2003  . CVA (cerebral infarction) 06/28/2012   rt hemiparesis, dysphagia  . Dendritic keratitis 2012  . Dysuria   . Edema 2003  . Esophageal dysmotility 08/19/2012  . Food impaction of esophagus 08/15/2012  . HTN (hypertension) 06/28/2012  . Hypertension   . Macular degeneration (senile) of retina, unspecified 2012  . Malignant neoplasm of breast (female), unspecified site 1970   S/P Rt radical mastectomy  . NSTEMI (non-ST elevated myocardial infarction) ([place]) 06/28/2012  . Osteoarthrosis, unspecified whether generalized or localized, unspecified site 2013  . Pacemaker   . Personal history of colonic polyps     s/p colonoscopy/polypectomy  2003  . Pure hyperglyceridemia 2004  . Senile osteoporosis 2003  . Stroke ([place]) 06/28/2012  . Tinnitus 2007  . Type II or unspecified type diabetes mellitus without mention of complication, not stated as uncontrolled 2003  . Unspecified arthropathy, pelvic region and thigh 2010  . Unspecified glaucoma(365.9) 2013  . Unspecified hypothyroidism 2009  .  Unspecified vitamin D deficiency 2009  . Urinary retention 07/07/2012   Past Surgical History:  Procedure Laterality Date  . BOTOX INJECTION N/A 10/03/2012   Procedure: BOTOX INJECTION;  Surgeon: [name], MD;  [facility];  Service: Endoscopy;  Laterality: N/A;  . ESOPHAGOGASTRODUODENOSCOPY N/A 08/15/2012   Procedure: ESOPHAGOGASTRODUODENOSCOPY (EGD);  Surgeon: [name], MD;  Location[facility] ENDOSCOPY;  Service: Endoscopy;  Laterality: N/A;  . ESOPHAGOGASTRODUODENOSCOPY N/A 08/15/2012   Procedure: ESOPHAGOGASTRODUODENOSCOPY (EGD);  Surgeon: [name], MD;  Location: [place];  Service: Endoscopy;  Laterality: N/A;  . ESOPHAGOGASTRODUODENOSCOPY N/A 10/03/2012   Procedure: ESOPHAGOGASTRODUODENOSCOPY (EGD);  Surgeon: [name], MD;  Location[facility];  Service: Endoscopy;  Laterality: N/A;  . ESOPHAGOGASTRODUODENOSCOPY N/A 12/22/2012   Procedure: ESOPHAGOGASTRODUODENOSCOPY (EGD);  Surgeon: [name], MD;  Location: [facility] ENDOSCOPY;  Service: Endoscopy;  Laterality: N/A;  . ESOPHAGOSCOPY N/A 08/15/2012   Procedure: ESOPHAGOSCOPY;  Surgeon: [name], MD;  Location: [place];  Service: ENT;  Laterality: N/A;  . FOREIGN BODY REMOVAL ESOPHAGEAL N/A 08/15/2012   Procedure: REMOVAL FOREIGN BODY ESOPHAGEAL;  Surgeon: [name], MD;  Location: [place];  Service: ENT;  Laterality: N/A;  . INSERT / REPLACE / REMOVE PACEMAKER    . MASTECTOMY Right 1970   Cancer  . SAVORY DILATION N/A 10/03/2012   Procedure: SAVORY DILATION;  Surgeon: [name], MD;  [facility];  Service: Endoscopy;  Laterality: N/A;    Allergies  Allergen Reactions  . Augmentin [Amoxicillin-Pot Clavulanate] Nausea And Vomiting  . Lactose Intolerance (Gi)     Outpatient Encounter Medications as of 04/25/2018  Medication Sig  . acetaminophen (TYLENOL) 650 MG CR tablet Take 650 mg by mouth 2 (two) times daily. For pain  . carboxymethylcellulose (REFRESH PLUS) 0.5 % SOLN 2 drops 2 (two) times daily.   .  Dentifrices (BIOTENE DRY MOUTH CARE DT) Place 1 spray onto teeth 4 (four) times daily.  . Docosanol (ABREVA) 10 % CREA Apply topically as needed.  Marland Kitchen escitalopram (LEXAPRO) 20 MG tablet Take 20 mg by mouth daily. For depression/ anxiety.  . furosemide (LASIX) 40 MG tablet Take 40 mg by mouth.  Marland Kitchen GLUCERNA (GLUCERNA) LIQD Take 237 mLs by mouth daily as needed.  Marland Kitchen levothyroxine (SYNTHROID, LEVOTHROID) 75 MCG tablet Take 75 mcg by mouth daily before breakfast. For thyroid therapy  . liver oil-zinc oxide (DESITIN) 40 % ointment Apply 1 application topically as needed for irritation.  Marland Kitchen LORazepam (ATIVAN) 0.5 MG tablet Take 0.25 mg by mouth at bedtime.   Marland Kitchen losartan (COZAAR) 25 MG tablet Take 1 tablet (25 mg total) by mouth daily.  . metoprolol tartrate (LOPRESSOR) 25 MG tablet Take 25 mg by mouth 2 (two) times daily.  Marland Kitchen omeprazole (PRILOSEC) 20 MG capsule Take 20 mg by mouth daily.  . potassium chloride SA (K-DUR,KLOR-CON) 20 MEQ tablet Take 20 mEq by mouth daily.  [place] Hazel 14 % LIQD Apply topically as needed (for hemorrhoid flare up).  . [DISCONTINUED] LORazepam (ATIVAN) 0.5 MG tablet Take 0.5 tablets (0.25 mg total) by mouth 3 (three) times daily as needed for anxiety.  No facility-administered encounter medications on file as of 04/25/2018.     Review of Systems  Constitutional: Negative for activity change, appetite change, chills, diaphoresis, fatigue, fever and unexpected weight change.  HENT: Negative for congestion.   Respiratory: Negative for cough, shortness of breath and wheezing.   Cardiovascular: Positive for leg swelling. Negative for chest pain and palpitations.  Gastrointestinal: Negative for abdominal distention, abdominal pain, constipation and diarrhea.  Genitourinary: Negative for difficulty urinating and dysuria.  Musculoskeletal: Positive for gait problem. Negative for arthralgias, back pain, joint swelling and myalgias.  Neurological: Positive for weakness. Negative for  dizziness, tremors, seizures, syncope, facial asymmetry, speech difficulty, light-headedness, numbness and headaches.  Psychiatric/Behavioral: Positive for confusion and dysphoric mood. Negative for agitation, behavioral problems and decreased concentration.    Immunization History  Administered Date(s) Administered  . Influenza Inj Mdck Quad Pf 12/22/2015  . Influenza Whole 12/17/2012  . Influenza,inj,Quad PF,6+ Mos 12/24/2017  . Influenza-Unspecified 12/08/2013, 12/16/2014, 12/27/2016  . PPD Test 07/18/2012  . Pneumococcal Conjugate-13 08/27/2015   Pertinent  Health Maintenance Due  Topic Date Due  . PNA vac Low Risk Adult (2 of 2 - PPSV23) 08/26/2016  . FOOT EXAM  06/26/2018  . OPHTHALMOLOGY EXAM  07/04/2018  . INFLUENZA VACCINE  Completed   Fall Risk  04/08/2018 07/10/2017 06/27/2016 11/07/2015 01/24/2015  Falls in the past year? Exclusion - non ambulatory No No No No  Risk for fall due to : - - - History of fall(s) -   Functional Status Survey:    Vitals:   04/25/18 1323  Weight: 181 lb 9.6 oz (82.4 kg)   Body mass index is 34.31 kg/m. Physical Exam Constitutional:      General: She is not in acute distress.    Appearance: She is not diaphoretic.  HENT:     Head: Normocephalic and atraumatic.  Neck:     Vascular: No JVD.  Cardiovascular:     Rate and Rhythm: Normal rate and regular rhythm.     Heart sounds: No murmur.  Pulmonary:     Effort: Pulmonary effort is normal. No respiratory distress.     Breath sounds: Normal breath sounds. No wheezing.  Abdominal:     General: Abdomen is flat. Bowel sounds are normal.     Palpations: Abdomen is soft.  Musculoskeletal:     Right lower leg: Edema (+2) present.     Left lower leg: Edema (+2) present.  Lymphadenopathy:     Cervical: No cervical adenopathy.  Skin:    General: Skin is warm and dry.  Neurological:     Mental Status: She is alert and oriented to person, place, and time.     Comments: Right sided hemiparesis   Psychiatric:        Mood and Affect: Mood normal.     Labs reviewed: Recent Labs    05/06/17 0600 03/18/18 0600  NA 140 144  K 4.7 4.4  BUN 18 15  CREATININE 0.5 0.5   Recent Labs    05/06/17 0600  AST 33  ALT 20  ALKPHOS 52   No results for input(s): WBC, NEUTROABS, HGB, HCT, MCV, PLT in the last 8760 hours. Lab Results  Component Value Date   TSH 2.11 08/27/2017   Lab Results  Component Value Date   HGBA1C 7.6 02/11/2018   Lab Results  Component Value Date   CHOL 135 09/19/2015   HDL 50 09/19/2015   LDLCALC 55 09/19/2015   TRIG 149 09/19/2015   CHOLHDL  4.1 07/01/2012    Significant Diagnostic Results in last 30 days:  No results found.  Assessment/Plan 1. Chronic diastolic congestive heart failure (HCC) Improved volume status Continue with blue discoloration to both hands but no hypoxia or sob Continue Lasix 40 mg qd, continue arb therapy Comfort care goals  2. Acquired hypothyroidism Continue Synthroid 75 mcg qd and TSH annually  3. Esophageal dysmotility Not complaint with recommended diet and has signed a waiver. No current issues reported.   4. Essential hypertension Controlled Continue lasix, cozaar  5. Controlled type 2 diabetes mellitus with complication, without long-term current use of insulin (HCC) Improved, goal A1C <8%  Family would like hospice when she is appropriate.Seen by palliative care in the past.   Family/ staff Communication: discussed with resident  Labs/tests ordered:  NA

## 2018-05-16 ENCOUNTER — Non-Acute Institutional Stay (SKILLED_NURSING_FACILITY): Payer: Medicare Other | Admitting: Adult Health

## 2018-05-16 DIAGNOSIS — F0150 Vascular dementia without behavioral disturbance: Secondary | ICD-10-CM

## 2018-05-16 DIAGNOSIS — M25511 Pain in right shoulder: Secondary | ICD-10-CM

## 2018-05-16 DIAGNOSIS — I69351 Hemiplegia and hemiparesis following cerebral infarction affecting right dominant side: Secondary | ICD-10-CM | POA: Diagnosis not present

## 2018-05-16 DIAGNOSIS — E039 Hypothyroidism, unspecified: Secondary | ICD-10-CM | POA: Diagnosis not present

## 2018-05-16 DIAGNOSIS — I5032 Chronic diastolic (congestive) heart failure: Secondary | ICD-10-CM | POA: Diagnosis not present

## 2018-05-16 DIAGNOSIS — E118 Type 2 diabetes mellitus with unspecified complications: Secondary | ICD-10-CM

## 2018-05-16 NOTE — Progress Notes (Signed)
Location:  Occupational psychologist of Service:  SNF (31) Provider:  Cindi Carbon, ANP [place] [phone]   [name], DO  Patient Care Team: [name], DO as PCP - General (Geriatric Medicine) Community, [facility] [name], Altamese Dilling, MD as Attending Physician (Gastroenterology) Garvin Fila, MD as Consulting Physician (Neurology) Julianne Handler, NP as Nurse Practitioner [facility] and Palliative Medicine)  Extended Emergency Contact Information Primary Emergency Contact: Jerome Address: [address]          [place], [place] [address] [name] of [place] Phone: [phone] Mobile Phone: [phone] Relation: Daughter  Code Status:  DNR Goals of care: Advanced Directive information Advanced Directives 04/01/2018  Does Patient Have a Medical Advance Directive? Yes  Type of Paramedic of [place];Living will;Out of facility DNR (pink MOST or yellow form)  Does patient want to make changes to medical advance directive? No - Patient declined  Copy of [place] in Chart? Yes - validated most recent copy scanned in chart (See row information)  Pre-existing out of facility DNR order (yellow form or pink MOST form) Yellow form placed in chart (order not valid for inpatient use);Pink MOST form placed in chart (order not valid for inpatient use)     Chief Complaint  Patient presents with  . Medical Management of Chronic Issues    HPI:  Pt is a 85 y.o. female seen today for medical management of chronic diseases.   She has a hx of CVA with right sided hemiparesis. She reports right shoulder pain for the past few days with lateral movement. She does not remember an injury. No numbness or tingling noted.  Denies any sob, cp, worsening edema. Doing well on lasix 40 mg daily with weight trending downward.  Eats gold fish and chocolate regularly for QOL with noted history  of dysphagia and esophageal impaction (waiver signed) Progressive short term memory loss reported.   Functional status: incontinent, stand up lift Past Medical History:  Diagnosis Date  . Abnormality of gait 2007  . Acquired cyst of kidney 2002  . Acute sinus infection 11/25/2012  . Allergic rhinitis due to pollen   . Altered mental status 11/28/2012  . Anxiety state, unspecified   . Atrioventricular block, complete ([place]) 2003   s/p PPM 2003, generator chnage 2011  . Cardiac pacemaker in situ 2003  . Coronary atherosclerosis of native coronary artery 2003   non obstructive by Cath 2003  . CVA (cerebral infarction) 06/28/2012   rt hemiparesis, dysphagia  . Dendritic keratitis 2012  . Dysuria   . Edema 2003  . Esophageal dysmotility 08/19/2012  . Food impaction of esophagus 08/15/2012  . HTN (hypertension) 06/28/2012  . Hypertension   . Macular degeneration (senile) of retina, unspecified 2012  . Malignant neoplasm of breast (female), unspecified site 1970   S/P Rt radical mastectomy  . NSTEMI (non-ST elevated myocardial infarction) ([place]) 06/28/2012  . Osteoarthrosis, unspecified whether generalized or localized, unspecified site 2013  . Pacemaker   . Personal history of colonic polyps     s/p colonoscopy/polypectomy 2003  . Pure hyperglyceridemia 2004  . Senile osteoporosis 2003  . Stroke ([place]) 06/28/2012  . Tinnitus 2007  . Type II or unspecified type diabetes mellitus without mention of complication, not stated as uncontrolled 2003  . Unspecified arthropathy, pelvic region and thigh 2010  . Unspecified glaucoma(365.9) 2013  . Unspecified hypothyroidism 2009  . Unspecified vitamin D deficiency 2009  .  Urinary retention 07/07/2012   Past Surgical History:  Procedure Laterality Date  . BOTOX INJECTION N/A 10/03/2012   Procedure: BOTOX INJECTION;  Surgeon: [name], MD;  [facility];  Service: Endoscopy;  Laterality: N/A;  . ESOPHAGOGASTRODUODENOSCOPY N/A 08/15/2012    Procedure: ESOPHAGOGASTRODUODENOSCOPY (EGD);  Surgeon: [name], MD;  Location: [facility] ENDOSCOPY;  Service: Endoscopy;  Laterality: N/A;  . ESOPHAGOGASTRODUODENOSCOPY N/A 08/15/2012   Procedure: ESOPHAGOGASTRODUODENOSCOPY (EGD);  Surgeon: [name], MD;  Location: [place];  Service: Endoscopy;  Laterality: N/A;  . ESOPHAGOGASTRODUODENOSCOPY N/A 10/03/2012   Procedure: ESOPHAGOGASTRODUODENOSCOPY (EGD);  Surgeon: [name], MD;  Location: [facility];  Service: Endoscopy;  Laterality: N/A;  . ESOPHAGOGASTRODUODENOSCOPY N/A 12/22/2012   Procedure: ESOPHAGOGASTRODUODENOSCOPY (EGD);  Surgeon: [name], MD;  Location: [facility] ENDOSCOPY;  Service: Endoscopy;  Laterality: N/A;  . ESOPHAGOSCOPY N/A 08/15/2012   Procedure: ESOPHAGOSCOPY;  Surgeon: [name], MD;  Location: [place];  Service: ENT;  Laterality: N/A;  . FOREIGN BODY REMOVAL ESOPHAGEAL N/A 08/15/2012   Procedure: REMOVAL FOREIGN BODY ESOPHAGEAL;  Surgeon: [name], MD;  Location: [place];  Service: ENT;  Laterality: N/A;  . INSERT / REPLACE / REMOVE PACEMAKER    . MASTECTOMY Right 1970   Cancer  . SAVORY DILATION N/A 10/03/2012   Procedure: SAVORY DILATION;  Surgeon: [name], MD;  [facility];  Service: Endoscopy;  Laterality: N/A;    Allergies  Allergen Reactions  . Augmentin [Amoxicillin-Pot Clavulanate] Nausea And Vomiting  . Lactose Intolerance (Gi)     Outpatient Encounter Medications as of 05/16/2018  Medication Sig  . acetaminophen (TYLENOL) 650 MG CR tablet Take 650 mg by mouth 2 (two) times daily. For pain  . carboxymethylcellulose (REFRESH PLUS) 0.5 % SOLN 2 drops 2 (two) times daily.   . Dentifrices (BIOTENE DRY MOUTH CARE DT) Place 1 spray onto teeth 4 (four) times daily.  . Docosanol (ABREVA) 10 % CREA Apply topically as needed.  Marland Kitchen escitalopram (LEXAPRO) 20 MG tablet Take 20 mg by mouth daily. For depression/ anxiety.  . furosemide (LASIX) 40 MG tablet Take 40 mg by mouth.  Marland Kitchen GLUCERNA (GLUCERNA) LIQD Take  237 mLs by mouth daily as needed.  Marland Kitchen levothyroxine (SYNTHROID, LEVOTHROID) 75 MCG tablet Take 75 mcg by mouth daily before breakfast. For thyroid therapy  . liver oil-zinc oxide (DESITIN) 40 % ointment Apply 1 application topically as needed for irritation.  Marland Kitchen LORazepam (ATIVAN) 0.5 MG tablet Take 0.25 mg by mouth at bedtime.   Marland Kitchen losartan (COZAAR) 25 MG tablet Take 1 tablet (25 mg total) by mouth daily.  . metoprolol tartrate (LOPRESSOR) 25 MG tablet Take 25 mg by mouth 2 (two) times daily.  Marland Kitchen omeprazole (PRILOSEC) 20 MG capsule Take 20 mg by mouth daily.  . potassium chloride SA (K-DUR,KLOR-CON) 20 MEQ tablet Take 20 mEq by mouth daily.  Addison Lank Hazel 14 % LIQD Apply topically as needed (for hemorrhoid flare up).   No facility-administered encounter medications on file as of 05/16/2018.     Review of Systems  Constitutional: Negative for activity change, appetite change, chills, diaphoresis, fatigue, fever and unexpected weight change.  HENT: Negative for congestion.   Eyes: Negative for visual disturbance.  Respiratory: Negative for cough, shortness of breath and wheezing.   Cardiovascular: Positive for leg swelling. Negative for chest pain and palpitations.  Gastrointestinal: Negative for abdominal distention, abdominal pain, constipation and diarrhea.  Genitourinary: Negative for difficulty urinating and dysuria.  Musculoskeletal: Positive for  gait problem. Negative for arthralgias, back pain, joint swelling and myalgias.  Neurological: Positive for weakness. Negative for dizziness, tremors, seizures, syncope, facial asymmetry, speech difficulty, light-headedness, numbness and headaches.  Psychiatric/Behavioral: Positive for confusion. Negative for agitation and behavioral problems.    Immunization History  Administered Date(s) Administered  . Influenza Inj Mdck Quad Pf 12/22/2015  . Influenza Whole 12/17/2012  . Influenza,inj,Quad PF,6+ Mos 12/24/2017  . Influenza-Unspecified  12/08/2013, 12/16/2014, 12/27/2016  . PPD Test 07/18/2012  . Pneumococcal Conjugate-13 08/27/2015   Pertinent  Health Maintenance Due  Topic Date Due  . PNA vac Low Risk Adult (2 of 2 - PPSV23) 08/26/2016  . FOOT EXAM  06/26/2018  . OPHTHALMOLOGY EXAM  07/04/2018  . INFLUENZA VACCINE  Completed   Fall Risk  04/08/2018 07/10/2017 06/27/2016 11/07/2015 01/24/2015  Falls in the past year? Exclusion - non ambulatory No No No No  Risk for fall due to : - - - History of fall(s) -   Functional Status Survey:    Vitals:   05/19/18 0739  Weight: 176 lb 12.8 oz (80.2 kg)   Body mass index is 33.41 kg/m. Physical Exam Vitals signs and nursing note reviewed.  Constitutional:      General: She is not in acute distress.    Appearance: She is not diaphoretic.  HENT:     Head: Normocephalic and atraumatic.  Neck:     Vascular: No JVD.  Cardiovascular:     Rate and Rhythm: Normal rate and regular rhythm.     Heart sounds: No murmur.  Pulmonary:     Effort: Pulmonary effort is normal. No respiratory distress.     Breath sounds: Normal breath sounds. No wheezing.  Abdominal:     General: Abdomen is flat. Bowel sounds are normal.     Palpations: Abdomen is soft.  Musculoskeletal:        General: No swelling, tenderness or deformity.     Right lower leg: Edema (+2) present.     Left lower leg: Edema (+1) present.     Comments: Decreased ROM to the right arm on abduction laterally. No tenderness, swelling, or redness.   Skin:    General: Skin is warm and dry.  Neurological:     Mental Status: She is alert and oriented to person, place, and time.     Comments: Right sided hemiparesis  Psychiatric:        Mood and Affect: Mood normal.     Labs reviewed: Recent Labs    03/18/18 0600  NA 144  K 4.4  BUN 15  CREATININE 0.5   No results for input(s): AST, ALT, ALKPHOS, BILITOT, PROT, ALBUMIN in the last 8760 hours. No results for input(s): WBC, NEUTROABS, HGB, HCT, MCV, PLT in the  last 8760 hours. Lab Results  Component Value Date   TSH 2.11 08/27/2017   Lab Results  Component Value Date   HGBA1C 7.6 02/11/2018   Lab Results  Component Value Date   CHOL 135 09/19/2015   HDL 50 09/19/2015   LDLCALC 55 09/19/2015   TRIG 149 09/19/2015   CHOLHDL 4.1 07/01/2012    Significant Diagnostic Results in last 30 days:  No results found.  Assessment/Plan 1. Acute pain of right shoulder Voltaren gel 1% qid to right shoulder x 7 days Robaxin 500 mg bid x 7 days   2. Chronic diastolic congestive heart failure (HCC) Doing well on lasix 40 mg qd Continue to monitor weight, diet, and s/s of sob  3.  Vascular dementia without behavioral disturbance (HCC) Progressive short term memory loss noted Continue Namenda  4. Hemiparesis affecting right side as late effect of cerebrovascular accident [facility]) Able to operate motorized scooter with the right hand Due to this weakness she may not be able to protect the right shoulder well in the lift which is causing pain Continue skilled care   5. Acquired hypothyroidism Lab Results  Component Value Date   TSH 2.11 08/27/2017  Continue Synthroid 75 mcg qd   6. Controlled type 2 diabetes mellitus with complication, without long-term current use of insulin ([place]) Not compliant with diabetic diet Continue to monitor A1C q 6 mo Goal A1C <8% Lab Results  Component Value Date   HGBA1C 7.6 02/11/2018       Family/ staff Communication: discussed with resident  Labs/tests ordered:  NA

## 2018-05-19 ENCOUNTER — Encounter: Payer: Self-pay | Admitting: Adult Health

## 2018-05-27 ENCOUNTER — Encounter: Payer: Medicare Other | Admitting: *Deleted

## 2018-05-27 ENCOUNTER — Other Ambulatory Visit: Payer: Self-pay

## 2018-05-28 ENCOUNTER — Telehealth: Payer: Self-pay

## 2018-05-28 NOTE — Telephone Encounter (Signed)
Left message for patient to remind of missed remote transmission.  

## 2018-05-28 NOTE — Telephone Encounter (Signed)
Son in [place] called. He got the message about the missed remote transmission. The Pt lives in an [place], and he normally sends the transmissions when he goes to visit. Due to COVID precautions, no family is allowed to visit , and he says no one else has the equipment to send the transmissions.

## 2018-06-02 ENCOUNTER — Encounter: Payer: Self-pay | Admitting: Cardiology

## 2018-06-09 ENCOUNTER — Other Ambulatory Visit: Payer: Self-pay | Admitting: Adult Health

## 2018-06-09 MED ORDER — LORAZEPAM 0.5 MG PO TABS: 0.2500 mg | ORAL_TABLET | Freq: Every day | ORAL | 2 refills | Status: DC

## 2018-06-17 ENCOUNTER — Non-Acute Institutional Stay (SKILLED_NURSING_FACILITY): Payer: Medicare Other | Admitting: Internal Medicine

## 2018-06-17 ENCOUNTER — Encounter: Payer: Self-pay | Admitting: Internal Medicine

## 2018-06-17 DIAGNOSIS — E118 Type 2 diabetes mellitus with unspecified complications: Secondary | ICD-10-CM

## 2018-06-17 DIAGNOSIS — F0150 Vascular dementia without behavioral disturbance: Secondary | ICD-10-CM | POA: Diagnosis not present

## 2018-06-17 DIAGNOSIS — I69351 Hemiplegia and hemiparesis following cerebral infarction affecting right dominant side: Secondary | ICD-10-CM | POA: Diagnosis not present

## 2018-06-17 DIAGNOSIS — F339 Major depressive disorder, recurrent, unspecified: Secondary | ICD-10-CM

## 2018-06-17 DIAGNOSIS — K5904 Chronic idiopathic constipation: Secondary | ICD-10-CM

## 2018-06-17 DIAGNOSIS — K644 Residual hemorrhoidal skin tags: Secondary | ICD-10-CM | POA: Diagnosis not present

## 2018-06-17 NOTE — Progress Notes (Signed)
Patient ID: Rachel Cooke, female   DOB: [birth year], 85 y.o.   MRN: [id number]  Location:  [place] Room Number: [place] of Service:  SNF ([phone]) Provider:  [name], DO  Patient Care Team: [name], DO as PCP - General (Geriatric Medicine) Community, Well [name], Altamese Dilling, MD as Attending Physician (Gastroenterology) Garvin Fila, MD as Consulting Physician (Neurology) Julianne Handler, NP as Nurse Practitioner [facility] and Palliative Medicine)  Extended Emergency Contact Information Primary Emergency Contact: [place] Address: [address]          [place], [place] [address] [name] of [place] Phone: [phone] Mobile Phone: [phone] Relation: Daughter  Code Status:  DNR Goals of care: Advanced Directive information Advanced Directives 06/17/2018  Does Patient Have a Medical Advance Directive? Yes  Type of Paramedic of [place];Out of facility DNR (pink MOST or yellow form)  Does patient want to make changes to medical advance directive? No - Patient declined  Copy of [place] in Chart? Yes - validated most recent copy scanned in chart (See row information)  Pre-existing out of facility DNR order (yellow form or pink MOST form) Yellow form placed in chart (order not valid for inpatient use);Pink MOST form placed in chart (order not valid for inpatient use)   Chief Complaint  Patient presents with  . Medical Management of Chronic Issues    Routine Visit    HPI:  Pt is a 85 y.o. female seen today for medical management of chronic diseases.  She is doing well outside of boredom and loneliness amid covid pandemic.  Nursing and family have reported that her forgetfulness is getting worse.  I notice a slight progression each visit.  She forgets her hearing aids and nursing must remind her often.    She's had some rare episodes of tiny amounts of  rectal bleeding (less than the size of a quarter in her undergarments) after straining.  She has hemorrhoids.  She is resting at night.  She's eating well.  She has no chest pain or shortness of breath.  She gets around in her power chair.  She denies any pains.  Past Medical History:  Diagnosis Date  . Abnormality of gait 2007  . Acquired cyst of kidney 2002  . Acute sinus infection 11/25/2012  . Allergic rhinitis due to pollen   . Altered mental status 11/28/2012  . Anxiety state, unspecified   . Atrioventricular block, complete ([place]) 2003   s/p PPM 2003, generator chnage 2011  . Cardiac pacemaker in situ 2003  . Coronary atherosclerosis of native coronary artery 2003   non obstructive by Cath 2003  . CVA (cerebral infarction) 06/28/2012   rt hemiparesis, dysphagia  . Dendritic keratitis 2012  . Dysuria   . Edema 2003  . Esophageal dysmotility 08/19/2012  . Food impaction of esophagus 08/15/2012  . HTN (hypertension) 06/28/2012  . Hypertension   . Macular degeneration (senile) of retina, unspecified 2012  . Malignant neoplasm of breast (female), unspecified site 1970   S/P Rt radical mastectomy  . NSTEMI (non-ST elevated myocardial infarction) ([place]) 06/28/2012  . Osteoarthrosis, unspecified whether generalized or localized, unspecified site 2013  . Pacemaker   . Personal history of colonic polyps     s/p colonoscopy/polypectomy 2003  . Pure hyperglyceridemia 2004  . Senile osteoporosis 2003  . Stroke ([place]) 06/28/2012  . Tinnitus 2007  . Type II or unspecified type diabetes mellitus without  mention of complication, not stated as uncontrolled 2003  . Unspecified arthropathy, pelvic region and thigh 2010  . Unspecified glaucoma(365.9) 2013  . Unspecified hypothyroidism 2009  . Unspecified vitamin D deficiency 2009  . Urinary retention 07/07/2012   Past Surgical History:  Procedure Laterality Date  . BOTOX INJECTION N/A 10/03/2012   Procedure: BOTOX INJECTION;  Surgeon: [name], MD;  [facility];  Service: Endoscopy;  Laterality: N/A;  . ESOPHAGOGASTRODUODENOSCOPY N/A 08/15/2012   Procedure: ESOPHAGOGASTRODUODENOSCOPY (EGD);  Surgeon: [name], MD;  Location[facility] ENDOSCOPY;  Service: Endoscopy;  Laterality: N/A;  . ESOPHAGOGASTRODUODENOSCOPY N/A 08/15/2012   Procedure: ESOPHAGOGASTRODUODENOSCOPY (EGD);  Surgeon: [name], MD;  Location: [place];  Service: Endoscopy;  Laterality: N/A;  . ESOPHAGOGASTRODUODENOSCOPY N/A 10/03/2012   Procedure: ESOPHAGOGASTRODUODENOSCOPY (EGD);  Surgeon: [name], MD;  Location: [facility];  Service: Endoscopy;  Laterality: N/A;  . ESOPHAGOGASTRODUODENOSCOPY N/A 12/22/2012   Procedure: ESOPHAGOGASTRODUODENOSCOPY (EGD);  Surgeon: [name], MD;  Location: [facility] ENDOSCOPY;  Service: Endoscopy;  Laterality: N/A;  . ESOPHAGOSCOPY N/A 08/15/2012   Procedure: ESOPHAGOSCOPY;  Surgeon: [name], MD;  Location: [place];  Service: ENT;  Laterality: N/A;  . FOREIGN BODY REMOVAL ESOPHAGEAL N/A 08/15/2012   Procedure: REMOVAL FOREIGN BODY ESOPHAGEAL;  Surgeon: [name], MD;  Location: [place];  Service: ENT;  Laterality: N/A;  . INSERT / REPLACE / REMOVE PACEMAKER    . MASTECTOMY Right 1970   Cancer  . SAVORY DILATION N/A 10/03/2012   Procedure: SAVORY DILATION;  Surgeon: [name], MD;  [facility];  Service: Endoscopy;  Laterality: N/A;    Allergies  Allergen Reactions  . Augmentin [Amoxicillin-Pot Clavulanate] Nausea And Vomiting  . Lactose Intolerance (Gi)     Outpatient Encounter Medications as of 06/17/2018  Medication Sig  . acetaminophen (TYLENOL) 650 MG CR tablet Take 650 mg by mouth 2 (two) times daily. For pain  . carboxymethylcellulose (REFRESH PLUS) 0.5 % SOLN 2 drops 2 (two) times daily.   . Dentifrices (BIOTENE DRY MOUTH CARE DT) Place 1 spray onto teeth 4 (four) times daily.  . Docosanol (ABREVA) 10 % CREA Apply topically as needed.  Marland Kitchen escitalopram (LEXAPRO) 20 MG tablet Take 20 mg by mouth  daily. For depression/ anxiety.  . furosemide (LASIX) 40 MG tablet Take 40 mg by mouth.  Marland Kitchen GLUCERNA (GLUCERNA) LIQD Take 237 mLs by mouth daily as needed.  Marland Kitchen levothyroxine (SYNTHROID, LEVOTHROID) 75 MCG tablet Take 75 mcg by mouth daily before breakfast. For thyroid therapy  . liver oil-zinc oxide (DESITIN) 40 % ointment Apply 1 application topically as needed for irritation.  Marland Kitchen LORazepam (ATIVAN) 0.5 MG tablet Take 0.5 tablets (0.25 mg total) by mouth at bedtime.  Marland Kitchen losartan (COZAAR) 25 MG tablet Take 1 tablet (25 mg total) by mouth daily.  . metoprolol tartrate (LOPRESSOR) 25 MG tablet Take 25 mg by mouth 2 (two) times daily.  Marland Kitchen omeprazole (PRILOSEC) 20 MG capsule Take 20 mg by mouth daily.  . potassium chloride SA (K-DUR,KLOR-CON) 20 MEQ tablet Take 20 mEq by mouth daily.  Addison Lank Hazel 14 % LIQD Apply topically as needed (for hemorrhoid flare up).   No facility-administered encounter medications on file as of 06/17/2018.     Review of Systems  Constitutional: Negative for activity change, appetite change, chills, fatigue and fever.       Was sleeping in her power chair when I entered  HENT: Positive for hearing loss. Negative for congestion.  Respiratory: Negative for cough and shortness of breath.   Cardiovascular: Positive for leg swelling. Negative for chest pain and palpitations.  Gastrointestinal: Positive for blood in stool and constipation. Negative for abdominal pain, diarrhea and nausea.       Hemorrhoidal bleeding if she strains once in a while, not a consistent issue  Genitourinary: Negative for dysuria.  Musculoskeletal: Positive for gait problem. Negative for arthralgias.  Skin: Negative for color change.  Neurological: Positive for weakness.  Psychiatric/Behavioral: Positive for confusion. Negative for agitation, behavioral problems and sleep disturbance.    Immunization History  Administered Date(s) Administered  . Influenza Inj Mdck Quad Pf 12/22/2015  . Influenza  Whole 12/17/2012  . Influenza,inj,Quad PF,6+ Mos 12/24/2017  . Influenza-Unspecified 12/08/2013, 12/16/2014, 12/27/2016  . PPD Test 07/18/2012  . Pneumococcal Conjugate-13 08/27/2015   Pertinent  Health Maintenance Due  Topic Date Due  . PNA vac Low Risk Adult (2 of 2 - PPSV23) 08/26/2016  . FOOT EXAM  06/26/2018  . OPHTHALMOLOGY EXAM  07/04/2018  . INFLUENZA VACCINE  10/04/2018   Fall Risk  04/08/2018 07/10/2017 06/27/2016 11/07/2015 01/24/2015  Falls in the past year? Exclusion - non ambulatory No No No No  Risk for fall due to : - - - History of fall(s) -   Functional Status Survey:    Vitals:   06/17/18 1054  BP: (!) 133/92  Pulse: 83  Resp: 16  Temp: 97.6 F (36.4 C)  TempSrc: Oral  SpO2: 93%  Weight: 173 lb (78.5 kg)  Height: 5\' 1"  (1.549 m)   Body mass index is 32.69 kg/m. Physical Exam Vitals signs and nursing note reviewed.  Constitutional:      General: She is not in acute distress.    Appearance: Normal appearance. She is obese. She is not toxic-appearing.  HENT:     Head: Normocephalic and atraumatic.     Ears:     Comments: Hearing aids in, but TV still loud when I entered Eyes:     Comments: glasses  Neck:     Musculoskeletal: Neck supple.  Pulmonary:     Effort: Pulmonary effort is normal.  Abdominal:     General: Bowel sounds are normal.  Musculoskeletal: Normal range of motion.     Right lower leg: Edema present.     Comments: Uses power chair for mobility  Skin:    General: Skin is warm and dry.  Neurological:     General: No focal deficit present.     Mental Status: She is alert and oriented to person, place, and time.     Motor: Weakness present.     Comments: But short term memory loss notable; right hemiparesis and associated edema  Psychiatric:        Mood and Affect: Mood normal.     Labs reviewed: Recent Labs    03/18/18 0600  NA 144  K 4.4  BUN 15  CREATININE 0.5   No results for input(s): AST, ALT, ALKPHOS, BILITOT, PROT,  ALBUMIN in the last 8760 hours. No results for input(s): WBC, NEUTROABS, HGB, HCT, MCV, PLT in the last 8760 hours. Lab Results  Component Value Date   TSH 2.11 08/27/2017   Lab Results  Component Value Date   HGBA1C 7.6 02/11/2018   Lab Results  Component Value Date   CHOL 135 09/19/2015   HDL 50 09/19/2015   LDLCALC 55 09/19/2015   TRIG 149 09/19/2015   CHOLHDL 4.1 07/01/2012     Assessment/Plan 1. Vascular dementia  without behavioral disturbance ([place]) -gradually progressive short-term memory loss, but remains able to participate in activities (limited now by covid-19) -in SNF care so has support for adls   2. Hemiparesis affecting right side as late effect of cerebrovascular accident ([place]) -with edema and decreased mobility -cont power chair  3. Controlled type 2 diabetes mellitus with complication, without long-term current use of insulin ([place]) -fairly good control considering her limited food choices and love of sweet items Lab Results  Component Value Date   HGBA1C 7.6 02/11/2018  -goal less than 8.5 in snf residents with comorbities  4. Bleeding external hemorrhoids -try to avoid constipation, hydrate, fiber, mobility -has witch hazel for relief  5. Chronic idiopathic constipation -as above, mange with prn laxatives--has not been a consistent issue to require scheduled medications  6. Depression, recurrent ([place]) -has been in remission, but we're concerned this may get worse with social isolation (residents will be moving to having meals in their rooms now and no gatherings) -cont lexapro and prn ativan for anxiety -cont staff support with calls with her daughter, [name] and other family if desired  Family/ staff Communication: discussed with snf nurse  Labs/tests ordered:  No new  Heba Ige L. Ransom Nickson, D.O. [facility] [address], [place] [address] Cell Phone (Mon-Fri 8am-5pm):  [phone] On Call:   [phone] & follow prompts after 5pm & weekends Office Phone:  [phone] Office Fax:  [phone]

## 2018-07-14 ENCOUNTER — Non-Acute Institutional Stay (SKILLED_NURSING_FACILITY): Payer: Medicare Other | Admitting: Adult Health

## 2018-07-14 ENCOUNTER — Encounter: Payer: Self-pay | Admitting: Adult Health

## 2018-07-14 DIAGNOSIS — I69351 Hemiplegia and hemiparesis following cerebral infarction affecting right dominant side: Secondary | ICD-10-CM

## 2018-07-14 DIAGNOSIS — I872 Venous insufficiency (chronic) (peripheral): Secondary | ICD-10-CM

## 2018-07-14 DIAGNOSIS — I442 Atrioventricular block, complete: Secondary | ICD-10-CM

## 2018-07-14 DIAGNOSIS — E039 Hypothyroidism, unspecified: Secondary | ICD-10-CM | POA: Diagnosis not present

## 2018-07-14 DIAGNOSIS — J3089 Other allergic rhinitis: Secondary | ICD-10-CM

## 2018-07-14 DIAGNOSIS — F4321 Adjustment disorder with depressed mood: Secondary | ICD-10-CM

## 2018-07-14 DIAGNOSIS — I679 Cerebrovascular disease, unspecified: Secondary | ICD-10-CM

## 2018-07-14 DIAGNOSIS — I890 Lymphedema, not elsewhere classified: Secondary | ICD-10-CM

## 2018-07-14 DIAGNOSIS — F0150 Vascular dementia without behavioral disturbance: Secondary | ICD-10-CM | POA: Diagnosis not present

## 2018-07-14 DIAGNOSIS — E118 Type 2 diabetes mellitus with unspecified complications: Secondary | ICD-10-CM

## 2018-07-14 DIAGNOSIS — E669 Obesity, unspecified: Secondary | ICD-10-CM

## 2018-07-14 DIAGNOSIS — K224 Dyskinesia of esophagus: Secondary | ICD-10-CM

## 2018-07-14 DIAGNOSIS — I10 Essential (primary) hypertension: Secondary | ICD-10-CM

## 2018-07-14 DIAGNOSIS — I5032 Chronic diastolic (congestive) heart failure: Secondary | ICD-10-CM

## 2018-07-14 DIAGNOSIS — Z853 Personal history of malignant neoplasm of breast: Secondary | ICD-10-CM

## 2018-07-14 DIAGNOSIS — E66811 Obesity, class 1: Secondary | ICD-10-CM

## 2018-07-14 NOTE — Progress Notes (Signed)
Provider:   Cindi Carbon, ANP [place] [phone]  Location: [place] of Service:  SNF (31)   PCP: [name], DO Patient Care Team: [name], DO as PCP - General (Geriatric Medicine) [facility] [name], Altamese Dilling, MD as Attending Physician (Gastroenterology) Garvin Fila, MD as Consulting Physician (Neurology) Julianne Handler, NP as Nurse Practitioner [facility] and Palliative Medicine)  Extended Emergency Contact Information Primary Emergency Contact: Fernando Salinas Address: [address]          [place], [place] [address] [name] of [place] Phone: [phone] Mobile Phone: [phone] Relation: Daughter  Code Status: DNR Goals of Care: Advanced Directive information Advanced Directives 07/14/2018  Does Patient Have a Medical Advance Directive? Yes  Type of Paramedic of [place];Out of facility DNR (pink MOST or yellow form)  Does patient want to make changes to medical advance directive? -  Copy of [place] in Chart? -  Pre-existing out of facility DNR order (yellow form or pink MOST form) Yellow form placed in chart (order not valid for inpatient use);Pink MOST form placed in chart (order not valid for inpatient use)     Chief Complaint  Patient presents with  . Annual Exam    HPI: Patient is a 85 y.o. female seen today for an annual comprehensive examination. Her nurse reports that she is increasingly forgetful and has become paranoid of the staff stealing from her over the past few weeks. She is more irritable and reports anxiety about her relationship with her daughter. She states she is sleeping well and has a normal appetite.  BP controlled Weight improved on lasix 40 mg daily which was started due to edema with hx of diastolic chf. Denies any sob, pnd, doe.  Wears compression hose for leg edema. Esophageal dysmotility with hx of  impaction: currently on a puree diet and has signed a waiver to eat foods that she desires for QOL issues   Past Medical History:  Diagnosis Date  . Abnormality of gait 2007  . Acquired cyst of kidney 2002  . Acute sinus infection 11/25/2012  . Allergic rhinitis due to pollen   . Altered mental status 11/28/2012  . Anxiety state, unspecified   . Atrioventricular block, complete ([place]) 2003   s/p PPM 2003, generator chnage 2011  . Cardiac pacemaker in situ 2003  . Coronary atherosclerosis of native coronary artery 2003   non obstructive by Cath 2003  . CVA (cerebral infarction) 06/28/2012   rt hemiparesis, dysphagia  . Dendritic keratitis 2012  . Dysuria   . Edema 2003  . Esophageal dysmotility 08/19/2012  . Food impaction of esophagus 08/15/2012  . HTN (hypertension) 06/28/2012  . Hypertension   . Macular degeneration (senile) of retina, unspecified 2012  . Malignant neoplasm of breast (female), unspecified site 1970   S/P Rt radical mastectomy  . NSTEMI (non-ST elevated myocardial infarction) ([place]) 06/28/2012  . Osteoarthrosis, unspecified whether generalized or localized, unspecified site 2013  . Pacemaker   . Personal history of colonic polyps     s/p colonoscopy/polypectomy 2003  . Pure hyperglyceridemia 2004  . Senile osteoporosis 2003  . Stroke ([place]) 06/28/2012  . Tinnitus 2007  . Type II or unspecified type diabetes mellitus without mention of complication, not stated as uncontrolled 2003  . Unspecified arthropathy, pelvic region and thigh 2010  . Unspecified glaucoma(365.9) 2013  . Unspecified hypothyroidism 2009  . Unspecified vitamin D deficiency 2009  .  Urinary retention 07/07/2012   Past Surgical History:  Procedure Laterality Date  . BOTOX INJECTION N/A 10/03/2012   Procedure: BOTOX INJECTION;  Surgeon: [name], MD;  Location: [facility] ENDOSCOPY;  Service: Endoscopy;  Laterality: N/A;  . ESOPHAGOGASTRODUODENOSCOPY N/A 08/15/2012   Procedure: ESOPHAGOGASTRODUODENOSCOPY  (EGD);  Surgeon: [name], MD;  Location: [facility] ENDOSCOPY;  Service: Endoscopy;  Laterality: N/A;  . ESOPHAGOGASTRODUODENOSCOPY N/A 08/15/2012   Procedure: ESOPHAGOGASTRODUODENOSCOPY (EGD);  Surgeon: [name], MD;  Location: [facility];  Service: Endoscopy;  Laterality: N/A;  . ESOPHAGOGASTRODUODENOSCOPY N/A 10/03/2012   Procedure: ESOPHAGOGASTRODUODENOSCOPY (EGD);  Surgeon: [name], MD;  Location: [facility] ENDOSCOPY;  Service: Endoscopy;  Laterality: N/A;  . ESOPHAGOGASTRODUODENOSCOPY N/A 12/22/2012   Procedure: ESOPHAGOGASTRODUODENOSCOPY (EGD);  Surgeon: [name], MD;  Location[facility] ENDOSCOPY;  Service: Endoscopy;  Laterality: N/A;  . ESOPHAGOSCOPY N/A 08/15/2012   Procedure: ESOPHAGOSCOPY;  Surgeon: [name], MD;  Location: [place];  Service: ENT;  Laterality: N/A;  . FOREIGN BODY REMOVAL ESOPHAGEAL N/A 08/15/2012   Procedure: REMOVAL FOREIGN BODY ESOPHAGEAL;  Surgeon: [name], MD;  Location: [place];  Service: ENT;  Laterality: N/A;  . INSERT / REPLACE / REMOVE PACEMAKER    . MASTECTOMY Right 1970   Cancer  . SAVORY DILATION N/A 10/03/2012   Procedure: SAVORY DILATION;  Surgeon: [name], MD;  Location: [facility] ENDOSCOPY;  Service: Endoscopy;  Laterality: N/A;    reports that she has quit smoking. She has a 0.75 pack-year smoking history. She has never used smokeless tobacco. She reports that she does not drink alcohol or use drugs. Social History   Socioeconomic History  . Marital status: Widowed    Spouse name: Not on file  . Number of children: 3  . Years of education: 12th  . Highest education level: Not on file  Occupational History  . Occupation: retired  Scientific laboratory technician  . Financial resource strain: Not hard at all  . Food insecurity:    Worry: Never true    Inability: Never true  . Transportation needs:    Medical: No    Non-medical: No  Tobacco Use  . Smoking status: Former Smoker    Packs/day: 0.25    Years: 3.00    Pack years: 0.75  . Smokeless tobacco: Never Used   Substance and Sexual Activity  . Alcohol use: No  . Drug use: No  . Sexual activity: Never  Lifestyle  . Physical activity:    Days per week: 0 days    Minutes per session: 0 min  . Stress: Only a little  Relationships  . Social connections:    Talks on phone: More than three times a week    Gets together: More than three times a week    Attends religious service: Never    Active member of club or organization: No    Attends meetings of clubs or organizations: Never    Relationship status: Widowed  Other Topics Concern  . Not on file  Social History Narrative  . Not on file   Family History  Problem Relation Age of Onset  . Hypertension Father     Pertinent  Health Maintenance Due  Topic Date Due  . PNA vac Low Risk Adult (2 of 2 - PPSV23) 08/26/2016  . OPHTHALMOLOGY EXAM  07/04/2018  . INFLUENZA VACCINE  10/04/2018  . FOOT EXAM  07/14/2019   Fall Risk  07/14/2018 04/08/2018 07/10/2017 06/27/2016 11/07/2015  Falls in the past year? 0  Exclusion - non ambulatory No No No  Number falls in past yr: 0 - - - -  Injury with Fall? 0 - - - -  Risk for fall due to : Impaired balance/gait - - - History of fall(s)   Depression screen [facility] 2/9 07/10/2017 06/27/2016 11/07/2015 01/24/2015  Decreased Interest 0 0 1 0  Down, Depressed, Hopeless 0 0 1 0  PHQ - 2 Score 0 0 2 0  Altered sleeping - - 0 -  Tired, decreased energy - - 0 -  Change in appetite - - 0 -  Feeling bad or failure about yourself  - - 1 -  Trouble concentrating - - 0 -  Moving slowly or fidgety/restless - - 0 -  Suicidal thoughts - - 0 -  PHQ-9 Score - - 3 -  Difficult doing work/chores - - Somewhat difficult -    Functional Status Survey: Is the patient deaf or have difficulty hearing?: Yes Does the patient have difficulty seeing, even when wearing glasses/contacts?: Yes Does the patient have difficulty concentrating, remembering, or making decisions?: Yes Does the patient have difficulty walking or climbing stairs?:  Yes Does the patient have difficulty dressing or bathing?: Yes Does the patient have difficulty doing errands alone such as visiting a doctor's office or shopping?: Yes  Allergies  Allergen Reactions  . Augmentin [Amoxicillin-Pot Clavulanate] Nausea And Vomiting  . Lactose Intolerance (Gi)     Outpatient Encounter Medications as of 07/14/2018  Medication Sig  . acetaminophen (TYLENOL) 650 MG CR tablet Take 650 mg by mouth 2 (two) times daily. For pain  . carboxymethylcellulose (REFRESH PLUS) 0.5 % SOLN 2 drops 2 (two) times daily.   . Dentifrices (BIOTENE DRY MOUTH CARE DT) Place 1 spray onto teeth 4 (four) times daily.  . Docosanol (ABREVA) 10 % CREA Apply topically as needed.  Marland Kitchen escitalopram (LEXAPRO) 20 MG tablet Take 20 mg by mouth daily. For depression/ anxiety.  . furosemide (LASIX) 40 MG tablet Take 40 mg by mouth.  Marland Kitchen GLUCERNA (GLUCERNA) LIQD Take 237 mLs by mouth daily as needed.  Marland Kitchen levothyroxine (SYNTHROID, LEVOTHROID) 75 MCG tablet Take 75 mcg by mouth daily before breakfast. For thyroid therapy  . liver oil-zinc oxide (DESITIN) 40 % ointment Apply 1 application topically as needed for irritation.  Marland Kitchen LORazepam (ATIVAN) 0.5 MG tablet Take 0.5 tablets (0.25 mg total) by mouth at bedtime.  Marland Kitchen losartan (COZAAR) 25 MG tablet Take 1 tablet (25 mg total) by mouth daily.  . metoprolol tartrate (LOPRESSOR) 25 MG tablet Take 25 mg by mouth 2 (two) times daily.  Marland Kitchen omeprazole (PRILOSEC) 20 MG capsule Take 20 mg by mouth daily.  . potassium chloride SA (K-DUR,KLOR-CON) 20 MEQ tablet Take 20 mEq by mouth daily.  Addison Lank Haz[place] 14 % LIQD Apply topically as needed (for hemorrhoid flare up).   No facility-administered encounter medications on file as of 07/14/2018.     Review of Systems  Constitutional: Negative for activity change, appetite change, chills, diaphoresis, fatigue, fever and unexpected weight change.  HENT: Negative for congestion.   Eyes: Negative for visual disturbance.   Respiratory: Negative for cough, shortness of breath and wheezing.   Cardiovascular: Positive for leg swelling. Negative for chest pain and palpitations.  Gastrointestinal: Negative for abdominal distention, abdominal pain, constipation and diarrhea.  Genitourinary: Negative for difficulty urinating and dysuria.  Musculoskeletal: Positive for gait problem. Negative for arthralgias, back pain, joint swelling and myalgias.  Skin: Negative for wound.  Neurological: Negative for  dizziness, tremors, seizures, syncope, facial asymmetry, speech difficulty, weakness, light-headedness, numbness and headaches.  Psychiatric/Behavioral: Positive for behavioral problems and confusion. Negative for agitation, self-injury, sleep disturbance and suicidal ideas. The patient is nervous/anxious. The patient is not hyperactive.     Vitals:   07/14/18 1552  BP: 110/69  Pulse: 83  Resp: 16  Temp: 98.5 F (36.9 C)  SpO2: 97%  Weight: 171 lb 12.8 oz (77.9 kg)   Body mass index is 32.46 kg/m. Physical Exam Exam conducted with a chaperone present.  Constitutional:      General: She is not in acute distress.    Appearance: She is not diaphoretic.  HENT:     Head: Normocephalic and atraumatic.     Right Ear: Tympanic membrane, ear canal and external ear normal.     Left Ear: Tympanic membrane, ear canal and external ear normal.     Nose: No congestion.     Mouth/Throat:     Mouth: Mucous membranes are moist.     Pharynx: Oropharynx is clear. No oropharyngeal exudate or posterior oropharyngeal erythema.  Eyes:     Conjunctiva/sclera: Conjunctivae normal.     Pupils: Pupils are equal, round, and reactive to light.  Neck:     Musculoskeletal: No neck rigidity or muscular tenderness.     Thyroid: No thyromegaly.     Vascular: No carotid bruit or JVD.  Cardiovascular:     Rate and Rhythm: Normal rate.     Heart sounds: Normal heart sounds. No murmur.  Pulmonary:     Effort: Pulmonary effort is normal.  No respiratory distress.     Breath sounds: No stridor. Rales present.  Chest:     Chest wall: No mass, lacerations, deformity or swelling.     Breasts:        Right: Absent.        Left: Normal. No swelling, bleeding, inverted nipple, mass or nipple discharge.  Abdominal:     General: Bowel sounds are normal. There is no distension.     Palpations: Abdomen is soft.  Musculoskeletal: Normal range of motion.     Right lower leg: Edema present.     Left lower leg: Edema present.  Lymphadenopathy:     Cervical: No cervical adenopathy.     Upper Body:     Right upper body: No supraclavicular, axillary or pectoral adenopathy.     Left upper body: No supraclavicular, axillary or pectoral adenopathy.  Skin:    General: Skin is warm and dry.  Neurological:     Mental Status: She is alert and oriented to person, place, and time. Mental status is at baseline.     Comments: Right sided weakness hemiplegia  Psychiatric:        Mood and Affect: Mood normal.     Labs reviewed: Basic Metabolic Panel: Recent Labs    03/18/18 0600  NA 144  K 4.4  BUN 15  CREATININE 0.5   Liver Function Tests: No results for input(s): AST, ALT, ALKPHOS, BILITOT, PROT, ALBUMIN in the last 8760 hours. No results for input(s): LIPASE, AMYLASE in the last 8760 hours. No results for input(s): AMMONIA in the last 8760 hours. CBC: No results for input(s): WBC, NEUTROABS, HGB, HCT, MCV, PLT in the last 8760 hours. Cardiac Enzymes: No results for input(s): CKTOTAL, CKMB, CKMBINDEX, TROPONINI in the last 8760 hours. BNP: Invalid input(s): POCBNP Lab Results  Component Value Date   HGBA1C 7.6 02/11/2018   Lab Results  Component Value Date  TSH 2.11 08/27/2017   No results found for: VITAMINB12 No results found for: FOLATE No results found for: IRON, TIBC, FERRITIN  Imaging and Procedures obtained recently: No results found.  Assessment/Plan 1. Vascular dementia without behavioral disturbance (HCC)  Progressive decline in cognition and physical function c/w the disease, worse right now due to activity and visitation restrictions due to covid 19 Continue supportive care in the skilled environment.  2. Cerebrovascular disease With associated hx of CVA and right sided weakness   3. Hemiparesis affecting right side as late effect of cerebrovascular accident ([place]) Noted. Uses supportive motorized chair for ambulation.   4. Controlled type 2 diabetes mellitus with complication, without long-term current use of insulin (HCC) Lab Results  Component Value Date   HGBA1C 7.6 02/11/2018    Check A1C q 6 months, due in June  Diet controlled, goal <8%  5. Acquired hypothyroidism Lab Results  Component Value Date   TSH 2.11 08/27/2017  Continue Synthroid 75 mcg qd   6. Chronic diastolic congestive heart failure (HCC) Compensated. Continue lasix 40 mg qd and arb therapy  7. Chronic venous insufficiency Continue compression hose on in the am and off in the pm  8. Non-seasonal allergic rhinitis, unspecified trigger Not currently on medication, no symptoms reported.   9. Esophageal dysmotility No reported issues, continue puree diet with asp prec  10. Essential hypertension Controlled  11. Atrioventricular block, complete [facility]) S/p pacemaker  Remote transmission done in Dec of 2019, march apt missed due to Covid 19 restrictions   12. Situational depression Continues to report periods of anxiety and sadness. Would not taper ativan or lexapro   13. Obesity (BMI 30.0-34.9) BMI 32.46, above goal but trending down Due to her comfort care goals would not attempt aggressive diet restrictions  14. Lymphedema of arm Noted to right arm due to previous mastectomy. She does not like to wear compression hose but elevates it on a pillow.   15. History of breast cancer History of right mastectomy noted. No longer going out for mammograms due to age and goals of care. Left breast physical  exam WNL  Family/ staff Communication:  staff  Labs/tests ordered: NA

## 2018-08-13 ENCOUNTER — Encounter: Payer: Self-pay | Admitting: *Deleted

## 2018-08-14 LAB — NOVEL CORONAVIRUS, NAA: SARS-CoV-2, NAA: NEGATIVE

## 2018-09-02 DIAGNOSIS — L84 Corns and callosities: Secondary | ICD-10-CM | POA: Diagnosis not present

## 2018-09-02 DIAGNOSIS — E1159 Type 2 diabetes mellitus with other circulatory complications: Secondary | ICD-10-CM | POA: Diagnosis not present

## 2018-09-02 DIAGNOSIS — L602 Onychogryphosis: Secondary | ICD-10-CM | POA: Diagnosis not present

## 2018-09-05 ENCOUNTER — Other Ambulatory Visit: Payer: Self-pay | Admitting: Adult Health

## 2018-09-05 MED ORDER — LORAZEPAM 0.5 MG PO TABS: 0.2500 mg | ORAL_TABLET | Freq: Every day | ORAL | 0 refills | Status: DC

## 2018-09-09 ENCOUNTER — Non-Acute Institutional Stay (SKILLED_NURSING_FACILITY): Payer: Medicare Other | Admitting: Internal Medicine

## 2018-09-09 ENCOUNTER — Encounter: Payer: Self-pay | Admitting: Internal Medicine

## 2018-09-09 DIAGNOSIS — E039 Hypothyroidism, unspecified: Secondary | ICD-10-CM

## 2018-09-09 DIAGNOSIS — E118 Type 2 diabetes mellitus with unspecified complications: Secondary | ICD-10-CM | POA: Diagnosis not present

## 2018-09-09 DIAGNOSIS — I5032 Chronic diastolic (congestive) heart failure: Secondary | ICD-10-CM

## 2018-09-09 DIAGNOSIS — K224 Dyskinesia of esophagus: Secondary | ICD-10-CM | POA: Diagnosis not present

## 2018-09-09 DIAGNOSIS — I69351 Hemiplegia and hemiparesis following cerebral infarction affecting right dominant side: Secondary | ICD-10-CM

## 2018-09-09 DIAGNOSIS — F0150 Vascular dementia without behavioral disturbance: Secondary | ICD-10-CM

## 2018-09-09 NOTE — Progress Notes (Signed)
Patient ID: Rachel Cooke, female   DOB: [birth year], 85 y.o.   MRN: [id number]  Location:  [place] Room Number: [place] of Service:  SNF [phone]) Provider:   [name], DO  Patient Care Team: [name], DO as PCP - General (Geriatric Medicine) Community, Well Gwenette Greet, MD as Attending Physician (Gastroenterology) Garvin Fila, MD as Consulting Physician (Neurology) Julianne Handler, NP as Nurse Practitioner [facility] and Palliative Medicine)  Extended Emergency Contact Information Primary Emergency Contact: [place] Address: [address]          [place], [place] [address] [name] of [place] Phone: [phone] Mobile Phone: [phone] Relation: Daughter  Code Status:  DNR, palliative care Goals of care: Advanced Directive information Advanced Directives 07/14/2018  Does Patient Have a Medical Advance Directive? Yes  Type of Paramedic of [place];Out of facility DNR (pink MOST or yellow form)  Does patient want to make changes to medical advance directive? -  Copy of [place] in Chart? -  Pre-existing out of facility DNR order (yellow form or pink MOST form) Yellow form placed in chart (order not valid for inpatient use);Pink MOST form placed in chart (order not valid for inpatient use)     Chief Complaint  Patient presents with  . Medical Management of Chronic Issues    Routine visit    HPI:  Pt is a 85 y.o. female seen today for medical management of chronic diseases.  She lives in SNF for long-term care.  Reviewing nursing notes, she's been continuing to struggle with refusing baths in evenings much of the time.  Sounds like it takes a lot of encouragement for her to go.  She also had a recent accident with her power chair where she hit her bedside tablet and had right hand stuck (bruised considerably) and left leg with injury still requiring  dressing changes.    She is more confused and accusatory at times--saying that nursing gave her the injuries that were from her power chair accident.    When seen, she was pleasant with me, but seems confused.  She denies pain at present.  Weight is declining since January (though she'd gained considerable weight).      Past Medical History:  Diagnosis Date  . Abnormality of gait 2007  . Acquired cyst of kidney 2002  . Acute sinus infection 11/25/2012  . Allergic rhinitis due to pollen   . Altered mental status 11/28/2012  . Anxiety state, unspecified   . Atrioventricular block, complete ([place]) 2003   s/p PPM 2003, generator chnage 2011  . Cardiac pacemaker in situ 2003  . Coronary atherosclerosis of native coronary artery 2003   non obstructive by Cath 2003  . CVA (cerebral infarction) 06/28/2012   rt hemiparesis, dysphagia  . Dendritic keratitis 2012  . Dysuria   . Edema 2003  . Esophageal dysmotility 08/19/2012  . Food impaction of esophagus 08/15/2012  . HTN (hypertension) 06/28/2012  . Hypertension   . Macular degeneration (senile) of retina, unspecified 2012  . Malignant neoplasm of breast (female), unspecified site 1970   S/P Rt radical mastectomy  . NSTEMI (non-ST elevated myocardial infarction) ([place]) 06/28/2012  . Osteoarthrosis, unspecified whether generalized or localized, unspecified site 2013  . Pacemaker   . Personal history of colonic polyps     s/p colonoscopy/polypectomy 2003  . Pure hyperglyceridemia 2004  . Senile osteoporosis 2003  . Stroke ([place]) 06/28/2012  .  Tinnitus 2007  . Type II or unspecified type diabetes mellitus without mention of complication, not stated as uncontrolled 2003  . Unspecified arthropathy, pelvic region and thigh 2010  . Unspecified glaucoma(365.9) 2013  . Unspecified hypothyroidism 2009  . Unspecified vitamin D deficiency 2009  . Urinary retention 07/07/2012   Past Surgical History:  Procedure Laterality Date  . BOTOX INJECTION N/A  10/03/2012   Procedure: BOTOX INJECTION;  Surgeon: [name], MD;  Location: [facility] ENDOSCOPY;  Service: Endoscopy;  Laterality: N/A;  . ESOPHAGOGASTRODUODENOSCOPY N/A 08/15/2012   Procedure: ESOPHAGOGASTRODUODENOSCOPY (EGD);  Surgeon: [name], MD;  Location: [facility] ENDOSCOPY;  Service: Endoscopy;  Laterality: N/A;  . ESOPHAGOGASTRODUODENOSCOPY N/A 08/15/2012   Procedure: ESOPHAGOGASTRODUODENOSCOPY (EGD);  Surgeon: [name], MD;  Location: [place];  Service: Endoscopy;  Laterality: N/A;  . ESOPHAGOGASTRODUODENOSCOPY N/A 10/03/2012   Procedure: ESOPHAGOGASTRODUODENOSCOPY (EGD);  Surgeon: [name], MD;  Location: [facility];  Service: Endoscopy;  Laterality: N/A;  . ESOPHAGOGASTRODUODENOSCOPY N/A 12/22/2012   Procedure: ESOPHAGOGASTRODUODENOSCOPY (EGD);  Surgeon: [name], MD;  Location[facility]COPY;  Service: Endoscopy;  Laterality: N/A;  . ESOPHAGOSCOPY N/A 08/15/2012   Procedure: ESOPHAGOSCOPY;  Surgeon: [name], MD;  Location: [place];  Service: ENT;  Laterality: N/A;  . FOREIGN BODY REMOVAL ESOPHAGEAL N/A 08/15/2012   Procedure: REMOVAL FOREIGN BODY ESOPHAGEAL;  Surgeon: [name], MD;  Location: [facility];  Service: ENT;  Laterality: N/A;  . INSERT / REPLACE / REMOVE PACEMAKER    . MASTECTOMY Right 1970   Cancer  . SAVORY DILATION N/A 10/03/2012   Procedure: SAVORY DILATION;  Surgeon: [name], MD;  [facility] ENDOSCOPY;  Service: Endoscopy;  Laterality: N/A;    Allergies  Allergen Reactions  . Augmentin [Amoxicillin-Pot Clavulanate] Nausea And Vomiting  . Lactose Intolerance (Gi)     Outpatient Encounter Medications as of 09/09/2018  Medication Sig  . acetaminophen (TYLENOL) 650 MG CR tablet Take 650 mg by mouth 2 (two) times daily. For pain  . carboxymethylcellulose (REFRESH PLUS) 0.5 % SOLN 2 drops 2 (two) times daily.   . Dentifrices (BIOTENE DRY MOUTH CARE DT) Place 1 spray onto teeth 4 (four) times daily.  . Docosanol (ABREVA) 10 % CREA Apply topically as needed.  Marland Kitchen  escitalopram (LEXAPRO) 20 MG tablet Take 20 mg by mouth daily. For depression/ anxiety.  . furosemide (LASIX) 40 MG tablet Take 40 mg by mouth.  Marland Kitchen GLUCERNA (GLUCERNA) LIQD Take 237 mLs by mouth daily as needed.  Marland Kitchen levothyroxine (SYNTHROID, LEVOTHROID) 75 MCG tablet Take 75 mcg by mouth daily before breakfast. For thyroid therapy  . liver oil-zinc oxide (DESITIN) 40 % ointment Apply 1 application topically as needed for irritation.  Marland Kitchen LORazepam (ATIVAN) 0.5 MG tablet Take 0.5 tablets (0.25 mg total) by mouth at bedtime.  Marland Kitchen losartan (COZAAR) 25 MG tablet Take 1 tablet (25 mg total) by mouth daily.  . metoprolol tartrate (LOPRESSOR) 25 MG tablet Take 25 mg by mouth 2 (two) times daily.  Marland Kitchen omeprazole (PRILOSEC) 20 MG capsule Take 20 mg by mouth daily.  . potassium chloride SA (K-DUR,KLOR-CON) 20 MEQ tablet Take 20 mEq by mouth daily.  Addison Lank [place] 14 % LIQD Apply topically as needed (for hemorrhoid flare up).   No facility-administered encounter medications on file as of 09/09/2018.     Review of Systems  Constitutional: Positive for fatigue. Negative for activity change, appetite change, chills, fever and unexpected weight change.  HENT: Negative for congestion and sore  throat.   Eyes: Negative for visual disturbance.  Respiratory: Negative for chest tightness and shortness of breath.   Gastrointestinal: Negative for abdominal pain.  Genitourinary: Negative for dysuria.  Musculoskeletal: Positive for arthralgias and gait problem.  Skin: Positive for pallor.  Neurological: Positive for weakness. Negative for dizziness.  Psychiatric/Behavioral: Positive for behavioral problems and confusion. Negative for sleep disturbance and suicidal ideas. The patient is not nervous/anxious.     Immunization History  Administered Date(s) Administered  . Influenza Inj Mdck Quad Pf 12/22/2015  . Influenza Whole 12/17/2012  . Influenza,inj,Quad PF,6+ Mos 12/24/2017  . Influenza-Unspecified 12/08/2013,  12/16/2014, 12/27/2016  . PPD Test 07/18/2012  . Pneumococcal Conjugate-13 08/27/2015   Pertinent  Health Maintenance Due  Topic Date Due  . PNA vac Low Risk Adult (2 of 2 - PPSV23) 08/26/2016  . OPHTHALMOLOGY EXAM  07/04/2018  . INFLUENZA VACCINE  10/04/2018  . FOOT EXAM  07/14/2019   Fall Risk  07/14/2018 04/08/2018 07/10/2017 06/27/2016 11/07/2015  Falls in the past year? 0 Exclusion - non ambulatory No No No  Number falls in past yr: 0 - - - -  Injury with Fall? 0 - - - -  Risk for fall due to : Impaired balance/gait - - - History of fall(s)   Functional Status Survey:    Vitals:   09/09/18 1524  BP: (!) 131/7  Pulse: 64  Resp: 20  Temp: 98.4 F (36.9 C)  TempSrc: Oral  SpO2: 96%  Weight: 170 lb (77.1 kg)  Height: 5\' 1"  (1.549 m)   Body mass index is 32.12 kg/m. Physical Exam Constitutional:      General: She is not in acute distress.    Appearance: She is obese. She is not ill-appearing, toxic-appearing or diaphoretic.  HENT:     Head: Normocephalic and atraumatic.  Eyes:     Extraocular Movements: Extraocular movements intact.     Pupils: Pupils are equal, round, and reactive to light.  Cardiovascular:     Rate and Rhythm: Normal rate and regular rhythm.     Pulses: Normal pulses.     Heart sounds: Normal heart sounds.  Pulmonary:     Effort: Pulmonary effort is normal.     Breath sounds: Normal breath sounds.  Abdominal:     General: Bowel sounds are normal. There is no distension.     Palpations: Abdomen is soft.     Tenderness: There is no abdominal tenderness.  Musculoskeletal: Normal range of motion.  Skin:    General: Skin is warm and dry.     Comments: Dressing to LLE; ecchymoses right hand  Neurological:     General: No focal deficit present.     Mental Status: She is alert.     Motor: Weakness present.     Gait: Gait abnormal.     Comments: Short term memory loss  Psychiatric:     Comments: Pleasantly confused today     Labs  reviewed: Recent Labs    03/18/18 0600  NA 144  K 4.4  BUN 15  CREATININE 0.5   No results for input(s): AST, ALT, ALKPHOS, BILITOT, PROT, ALBUMIN in the last 8760 hours. No results for input(s): WBC, NEUTROABS, HGB, HCT, MCV, PLT in the last 8760 hours. Lab Results  Component Value Date   TSH 2.11 08/27/2017   Lab Results  Component Value Date   HGBA1C 7.6 02/11/2018   Lab Results  Component Value Date   CHOL 135 09/19/2015   HDL 50 09/19/2015  LDLCALC 55 09/19/2015   TRIG 149 09/19/2015   CHOLHDL 4.1 07/01/2012    Assessment/Plan 1. Vascular dementia with behavioral disturbance ([place]) -is progressing--is now having some behaviors with refusing baths and blaming nursing for things she's unfortunately done to herself accidentally -cont snf level of support and monitor--had safety changes to power wheelchair  2. Hemiparesis affecting right side as late effect of cerebrovascular accident ([place]) -ongoing, cont snf level care for support  3. Chronic diastolic congestive heart failure (HCC) -seems to have stabilized now -weight down  4. Esophageal dysmotility -cont diet with exceptions for quality of life purposes as recognized by family  57. Controlled type 2 diabetes mellitus with complication, without long-term current use of insulin ([place]) -at goal for her age and functional status Lab Results  Component Value Date   HGBA1C 7.6 02/11/2018    6. Acquired hypothyroidism Lab Results  Component Value Date   TSH 2.11 08/27/2017     Family/ staff Communication: discussed with snf nurse  Labs/tests ordered:  No new  Bettyjo Lundblad L. Mariabelen Pressly, D.O. [facility] [address], [place] [address] Cell Phone (Mon-Fri 8am-5pm):  [phone] On Call:  [phone] & follow prompts after 5pm & weekends Office Phone:  [phone] Office Fax:  [phone]

## 2018-09-12 ENCOUNTER — Encounter: Payer: Self-pay | Admitting: Internal Medicine

## 2018-09-22 DIAGNOSIS — Z961 Presence of intraocular lens: Secondary | ICD-10-CM | POA: Diagnosis not present

## 2018-09-22 DIAGNOSIS — H353132 Nonexudative age-related macular degeneration, bilateral, intermediate dry stage: Secondary | ICD-10-CM | POA: Diagnosis not present

## 2018-09-22 DIAGNOSIS — H52203 Unspecified astigmatism, bilateral: Secondary | ICD-10-CM | POA: Diagnosis not present

## 2018-09-22 LAB — HM DIABETES EYE EXAM

## 2018-09-23 ENCOUNTER — Encounter: Payer: Self-pay | Admitting: *Deleted

## 2018-10-02 ENCOUNTER — Other Ambulatory Visit: Payer: Self-pay | Admitting: Adult Health

## 2018-10-02 MED ORDER — LORAZEPAM 0.5 MG PO TABS: 0.2500 mg | ORAL_TABLET | Freq: Every day | ORAL | 2 refills | Status: DC

## 2018-10-06 ENCOUNTER — Encounter: Payer: Self-pay | Admitting: Adult Health

## 2018-10-06 ENCOUNTER — Non-Acute Institutional Stay (SKILLED_NURSING_FACILITY): Payer: Medicare Other | Admitting: Adult Health

## 2018-10-06 DIAGNOSIS — F4321 Adjustment disorder with depressed mood: Secondary | ICD-10-CM

## 2018-10-06 DIAGNOSIS — E039 Hypothyroidism, unspecified: Secondary | ICD-10-CM

## 2018-10-06 DIAGNOSIS — K224 Dyskinesia of esophagus: Secondary | ICD-10-CM | POA: Diagnosis not present

## 2018-10-06 DIAGNOSIS — I10 Essential (primary) hypertension: Secondary | ICD-10-CM

## 2018-10-06 DIAGNOSIS — I5032 Chronic diastolic (congestive) heart failure: Secondary | ICD-10-CM

## 2018-10-06 DIAGNOSIS — E118 Type 2 diabetes mellitus with unspecified complications: Secondary | ICD-10-CM | POA: Diagnosis not present

## 2018-10-06 NOTE — Progress Notes (Signed)
Location:  Occupational psychologist of Service:  SNF (31) Provider:   Cindi Carbon, ANP [place] [phone]   [name], DO  Patient Care Team: [name], DO as PCP - General (Geriatric Medicine) Community, Well [name], Altamese Dilling, MD as Attending Physician (Gastroenterology) Garvin Fila, MD as Consulting Physician (Neurology) Julianne Handler, NP as Nurse Practitioner (Hospice and Palliative Medicine) Luberta Mutter, MD as Consulting Physician (Ophthalmology)  Extended Emergency Contact Information Primary Emergency Contact: [place] Address: [address]          [place], [place] [address] [name] of [place] Phone: [phone] Mobile Phone: [phone] Relation: Daughter  Code Status:  DNR Goals of care: Advanced Directive information Advanced Directives 07/14/2018  Does Patient Have a Medical Advance Directive? Yes  Type of Paramedic of [place];Out of facility DNR (pink MOST or yellow form)  Does patient want to make changes to medical advance directive? -  Copy of [place] in Chart? -  Pre-existing out of facility DNR order (yellow form or pink MOST form) Yellow form placed in chart (order not valid for inpatient use);Pink MOST form placed in chart (order not valid for inpatient use)     Chief Complaint  Patient presents with  . Medical Management of Chronic Issues    HPI:  Pt is a 85 y.o. female seen today for medical management of chronic diseases.   She reports that her appetite is fair and that she is sleeping well. She feels depressed by the fact that she can not see her family and would like more activities in her day to keep her busy.  Denies any issues with chewing or swallowing. No recent fever or decreased 02 sats BP controlled Weight remains stable with no sob, doe, etc.  Wt Readings from Last 3 Encounters:  10/06/18 170 lb 9.6  oz (77.4 kg)  09/09/18 170 lb (77.1 kg)  07/14/18 171 lb 12.8 oz (77.9 kg)     Functional status: hoyer lift, incontinent  Past Medical History:  Diagnosis Date  . Abnormality of gait 2007  . Acquired cyst of kidney 2002  . Acute sinus infection 11/25/2012  . Allergic rhinitis due to pollen   . Altered mental status 11/28/2012  . Anxiety state, unspecified   . Atrioventricular block, complete ([place]) 2003   s/p PPM 2003, generator chnage 2011  . Cardiac pacemaker in situ 2003  . Coronary atherosclerosis of native coronary artery 2003   non obstructive by Cath 2003  . CVA (cerebral infarction) 06/28/2012   rt hemiparesis, dysphagia  . Dendritic keratitis 2012  . Dysuria   . Edema 2003  . Esophageal dysmotility 08/19/2012  . Food impaction of esophagus 08/15/2012  . HTN (hypertension) 06/28/2012  . Hypertension   . Macular degeneration (senile) of retina, unspecified 2012  . Malignant neoplasm of breast (female), unspecified site 1970   S/P Rt radical mastectomy  . NSTEMI (non-ST elevated myocardial infarction) ([place]) 06/28/2012  . Osteoarthrosis, unspecified whether generalized or localized, unspecified site 2013  . Pacemaker   . Personal history of colonic polyps     s/p colonoscopy/polypectomy 2003  . Pure hyperglyceridemia 2004  . Senile osteoporosis 2003  . Stroke ([place]) 06/28/2012  . Tinnitus 2007  . Type II or unspecified type diabetes mellitus without mention of complication, not stated as uncontrolled 2003  . Unspecified arthropathy, pelvic region and thigh 2010  . Unspecified glaucoma(365.9) 2013  .  Unspecified hypothyroidism 2009  . Unspecified vitamin D deficiency 2009  . Urinary retention 07/07/2012   Past Surgical History:  Procedure Laterality Date  . BOTOX INJECTION N/A 10/03/2012   Procedure: BOTOX INJECTION;  Surgeon: [name], MD;  [facility] ENDOSCOPY;  Service: Endoscopy;  Laterality: N/A;  . ESOPHAGOGASTRODUODENOSCOPY N/A 08/15/2012   Procedure:  ESOPHAGOGASTRODUODENOSCOPY (EGD);  Surgeon: [name], MD;  Location: [facility] ENDOSCOPY;  Service: Endoscopy;  Laterality: N/A;  . ESOPHAGOGASTRODUODENOSCOPY N/A 08/15/2012   Procedure: ESOPHAGOGASTRODUODENOSCOPY (EGD);  Surgeon: [name], MD;  Location: [place];  Service: Endoscopy;  Laterality: N/A;  . ESOPHAGOGASTRODUODENOSCOPY N/A 10/03/2012   Procedure: ESOPHAGOGASTRODUODENOSCOPY (EGD);  Surgeon: [name], MD;  Location: [facility];  Service: Endoscopy;  Laterality: N/A;  . ESOPHAGOGASTRODUODENOSCOPY N/A 12/22/2012   Procedure: ESOPHAGOGASTRODUODENOSCOPY (EGD);  Surgeon: [name], MD;  Location: [facility] ENDOSCOPY;  Service: Endoscopy;  Laterality: N/A;  . ESOPHAGOSCOPY N/A 08/15/2012   Procedure: ESOPHAGOSCOPY;  Surgeon: [name], MD;  Location: [facility];  Service: ENT;  Laterality: N/A;  . FOREIGN BODY REMOVAL ESOPHAGEAL N/A 08/15/2012   Procedure: REMOVAL FOREIGN BODY ESOPHAGEAL;  Surgeon: [name], MD;  Location: [place] [place];  Service: ENT;  Laterality: N/A;  . INSERT / REPLACE / REMOVE PACEMAKER    . MASTECTOMY Right 1970   Cancer  . SAVORY DILATION N/A 10/03/2012   Procedure: SAVORY DILATION;  Surgeon: [name], MD;  [facility] ENDOSCOPY;  Service: Endoscopy;  Laterality: N/A;    Allergies  Allergen Reactions  . Augmentin [Amoxicillin-Pot Clavulanate] Nausea And Vomiting  . Lactose Intolerance (Gi)     Outpatient Encounter Medications as of 10/06/2018  Medication Sig  . acetaminophen (TYLENOL) 650 MG CR tablet Take 650 mg by mouth 2 (two) times daily. For pain  . carboxymethylcellulose (REFRESH PLUS) 0.5 % SOLN 2 drops 2 (two) times daily.   . Dentifrices (BIOTENE DRY MOUTH CARE DT) Place 1 spray onto teeth 4 (four) times daily as needed.   . Docosanol (ABREVA) 10 % CREA Apply topically as needed.  Marland Kitchen escitalopram (LEXAPRO) 20 MG tablet Take 20 mg by mouth daily. For depression/ anxiety.  . furosemide (LASIX) 40 MG tablet Take 40 mg by mouth.  Marland Kitchen GLUCERNA (GLUCERNA) LIQD Take 1  Can by mouth daily. Divided 1/2 can bid  . levothyroxine (SYNTHROID, LEVOTHROID) 75 MCG tablet Take 75 mcg by mouth daily before breakfast. For thyroid therapy  . liver oil-zinc oxide (DESITIN) 40 % ointment Apply 1 application topically as needed for irritation.  Marland Kitchen LORazepam (ATIVAN) 0.5 MG tablet Take 0.5 tablets (0.25 mg total) by mouth at bedtime.  Marland Kitchen losartan (COZAAR) 25 MG tablet Take 1 tablet (25 mg total) by mouth daily.  . metoprolol tartrate (LOPRESSOR) 25 MG tablet Take 25 mg by mouth 2 (two) times daily.  Marland Kitchen omeprazole (PRILOSEC) 20 MG capsule Take 20 mg by mouth daily.  . potassium chloride SA (K-DUR,KLOR-CON) 20 MEQ tablet Take 20 mEq by mouth daily.  Addison Lank Rachel Cooke 14 % LIQD Apply topically as needed (for hemorrhoid flare up).   No facility-administered encounter medications on file as of 10/06/2018.     Review of Systems  Immunization History  Administered Date(s) Administered  . Influenza Inj Mdck Quad Pf 12/22/2015  . Influenza Whole 12/17/2012  . Influenza,inj,Quad PF,6+ Mos 12/24/2017  . Influenza-Unspecified 12/08/2013, 12/16/2014, 12/27/2016  . PPD Test 07/18/2012  . Pneumococcal Conjugate-13 08/27/2015   Pertinent  Health Maintenance Due  Topic Date Due  .  INFLUENZA VACCINE  10/04/2018  . FOOT EXAM  07/14/2019  . OPHTHALMOLOGY EXAM  09/22/2019  . PNA vac Low Risk Adult  Completed   Fall Risk  07/14/2018 04/08/2018 07/10/2017 06/27/2016 11/07/2015  Falls in the past year? 0 Exclusion - non ambulatory No No No  Number falls in past yr: 0 - - - -  Injury with Fall? 0 - - - -  Risk for fall due to : Impaired balance/gait - - - History of fall(s)   Functional Status Survey:    Vitals:   10/06/18 1109  Weight: 170 lb 9.6 oz (77.4 kg)   Body mass index is 32.23 kg/m. Physical Exam  Labs reviewed: Recent Labs    03/18/18 0600  NA 144  K 4.4  BUN 15  CREATININE 0.5   No results for input(s): AST, ALT, ALKPHOS, BILITOT, PROT, ALBUMIN in the last 8760 hours.  No results for input(s): WBC, NEUTROABS, HGB, HCT, MCV, PLT in the last 8760 hours. Lab Results  Component Value Date   TSH 2.11 08/27/2017   Lab Results  Component Value Date   HGBA1C 7.6 02/11/2018   Lab Results  Component Value Date   CHOL 135 09/19/2015   HDL 50 09/19/2015   LDLCALC 55 09/19/2015   TRIG 149 09/19/2015   CHOLHDL 4.1 07/01/2012    Significant Diagnostic Results in last 30 days:  No results found.  Assessment/Plan 1. Chronic diastolic congestive heart failure (HCC) Compensated Continue lasix 40 mg qd   2. Esophageal dysmotility Continue current diet of pureed food with waiver signed for exceptions for qol.  3. Controlled type 2 diabetes mellitus with complication, without long-term current use of insulin ([place]) Lab Results  Component Value Date   HGBA1C 7.6 02/11/2018  Controlled without medications   4. Acquired hypothyroidism Needs tsh   5. Situational depression She is experiencing some dysphoria associated with social isolation/covid 19.  Would not make any changes at this point to her regimen. I encouraged her to go outside and find ways to participate in the facility.  Continue lexapro 20 mg qd and ativan 0.25 mg at bedtime. Would not taper ativan at this time which may cause worsening of symptoms.   6. Essential hypertension Controlled. Continue lasix and losartan 25 mg qd.     Family/ staff Communication: staff/resident   Labs/tests ordered:  TSH A1C

## 2018-10-07 DIAGNOSIS — R7309 Other abnormal glucose: Secondary | ICD-10-CM | POA: Diagnosis not present

## 2018-10-07 DIAGNOSIS — E039 Hypothyroidism, unspecified: Secondary | ICD-10-CM | POA: Diagnosis not present

## 2018-10-07 DIAGNOSIS — E119 Type 2 diabetes mellitus without complications: Secondary | ICD-10-CM | POA: Diagnosis not present

## 2018-10-07 LAB — HEMOGLOBIN A1C: Hemoglobin A1C: 7.7

## 2018-10-07 LAB — TSH: TSH: 1.32 (ref 0.41–5.90)

## 2018-10-16 ENCOUNTER — Encounter: Payer: Self-pay | Admitting: Internal Medicine

## 2018-11-11 DIAGNOSIS — E1159 Type 2 diabetes mellitus with other circulatory complications: Secondary | ICD-10-CM | POA: Diagnosis not present

## 2018-11-11 DIAGNOSIS — L84 Corns and callosities: Secondary | ICD-10-CM | POA: Diagnosis not present

## 2018-11-11 DIAGNOSIS — L602 Onychogryphosis: Secondary | ICD-10-CM | POA: Diagnosis not present

## 2018-11-13 ENCOUNTER — Non-Acute Institutional Stay (SKILLED_NURSING_FACILITY): Payer: Medicare Other | Admitting: Adult Health

## 2018-11-13 DIAGNOSIS — F0150 Vascular dementia without behavioral disturbance: Secondary | ICD-10-CM | POA: Diagnosis not present

## 2018-11-13 DIAGNOSIS — I10 Essential (primary) hypertension: Secondary | ICD-10-CM | POA: Diagnosis not present

## 2018-11-13 DIAGNOSIS — I872 Venous insufficiency (chronic) (peripheral): Secondary | ICD-10-CM

## 2018-11-13 DIAGNOSIS — I442 Atrioventricular block, complete: Secondary | ICD-10-CM | POA: Diagnosis not present

## 2018-11-13 DIAGNOSIS — I679 Cerebrovascular disease, unspecified: Secondary | ICD-10-CM | POA: Diagnosis not present

## 2018-11-18 ENCOUNTER — Encounter: Payer: Self-pay | Admitting: Adult Health

## 2018-11-18 NOTE — Progress Notes (Signed)
Location:  [facility] of Service:  SNF (31) Provider:   Cindi Carbon, ANP [place] [phone]   [name], DO  Patient Care Team: [name], DO as PCP - General (Geriatric Medicine) Community, [facility] [name], Altamese Dilling, MD as Attending Physician (Gastroenterology) Garvin Fila, MD as Consulting Physician (Neurology) Julianne Handler, NP as Nurse Practitioner (Hospice and Palliative Medicine) Luberta Mutter, MD as Consulting Physician (Ophthalmology)  Extended Emergency Contact Information Primary Emergency Contact: [place] Address: [address]          [place], [place] [address] [name] of [place] Phone: [phone] Mobile Phone: [phone] Relation: Daughter  Code Status:  DNR Goals of care: Advanced Directive information Advanced Directives 07/14/2018  Does Patient Have a Medical Advance Directive? Yes  Type of Paramedic of [place];Out of facility DNR (pink MOST or yellow form)  Does patient want to make changes to medical advance directive? -  Copy of [place] in Chart? -  Pre-existing out of facility DNR order (yellow form or pink MOST form) Yellow form placed in chart (order not valid for inpatient use);Pink MOST form placed in chart (order not valid for inpatient use)     Chief Complaint  Patient presents with  . Medical Management of Chronic Issues    HPI:  Pt is a 85 y.o. female seen today for medical management of chronic diseases.    Staff report that she is more forgetful over time and can be accusatory or paranoid. The resident reports anxiety at times regarding family matters and also her own life and loss of independence over time due to health issues. She reports she is eating well and sleeping well at night.   No issues with edema, sob, cp, etc.   She has a pacemaker due to completed AV block, she has not had a  remote transmission documented since Dec.   BP controlled  Cerebrovascular disease with hx of CVA and right sided weakness in 2014.  Her goals of care are comfort based and she is followed by palliative care.  Functional status: hoyer lift, incontinent  Past Medical History:  Diagnosis Date  . Abnormality of gait 2007  . Acquired cyst of kidney 2002  . Acute sinus infection 11/25/2012  . Allergic rhinitis due to pollen   . Altered mental status 11/28/2012  . Anxiety state, unspecified   . Atrioventricular block, complete ([place]) 2003   s/p PPM 2003, generator chnage 2011  . Cardiac pacemaker in situ 2003  . Coronary atherosclerosis of native coronary artery 2003   non obstructive by Cath 2003  . CVA (cerebral infarction) 06/28/2012   rt hemiparesis, dysphagia  . Dendritic keratitis 2012  . Dysuria   . Edema 2003  . Esophageal dysmotility 08/19/2012  . Food impaction of esophagus 08/15/2012  . HTN (hypertension) 06/28/2012  . Hypertension   . Macular degeneration (senile) of retina, unspecified 2012  . Malignant neoplasm of breast (female), unspecified site 1970   S/P Rt radical mastectomy  . NSTEMI (non-ST elevated myocardial infarction) ([place]) 06/28/2012  . Osteoarthrosis, unspecified whether generalized or localized, unspecified site 2013  . Pacemaker   . Personal history of colonic polyps     s/p colonoscopy/polypectomy 2003  . Pure hyperglyceridemia 2004  . Senile osteoporosis 2003  . Stroke ([place]) 06/28/2012  . Tinnitus 2007  . Type II or unspecified type diabetes mellitus without mention of complication, not stated as  uncontrolled 2003  . Unspecified arthropathy, pelvic region and thigh 2010  . Unspecified glaucoma(365.9) 2013  . Unspecified hypothyroidism 2009  . Unspecified vitamin D deficiency 2009  . Urinary retention 07/07/2012   Past Surgical History:  Procedure Laterality Date  . BOTOX INJECTION N/A 10/03/2012   Procedure: BOTOX INJECTION;  Surgeon: [name], MD;   [facility];  Service: Endoscopy;  Laterality: N/A;  . ESOPHAGOGASTRODUODENOSCOPY N/A 08/15/2012   Procedure: ESOPHAGOGASTRODUODENOSCOPY (EGD);  Surgeon: [name], MD;  Location: [facility] ENDOSCOPY;  Service: Endoscopy;  Laterality: N/A;  . ESOPHAGOGASTRODUODENOSCOPY N/A 08/15/2012   Procedure: ESOPHAGOGASTRODUODENOSCOPY (EGD);  Surgeon: [name], MD;  Location: [facility];  Service: Endoscopy;  Laterality: N/A;  . ESOPHAGOGASTRODUODENOSCOPY N/A 10/03/2012   Procedure: ESOPHAGOGASTRODUODENOSCOPY (EGD);  Surgeon: [name], MD;  Location: [facility];  Service: Endoscopy;  Laterality: N/A;  . ESOPHAGOGASTRODUODENOSCOPY N/A 12/22/2012   Procedure: ESOPHAGOGASTRODUODENOSCOPY (EGD);  Surgeon: [name], MD;  Location: [facility];  Service: Endoscopy;  Laterality: N/A;  . ESOPHAGOSCOPY N/A 08/15/2012   Procedure: ESOPHAGOSCOPY;  Surgeon: [name], MD;  Location: [facility];  Service: ENT;  Laterality: N/A;  . FOREIGN BODY REMOVAL ESOPHAGEAL N/A 08/15/2012   Procedure: REMOVAL FOREIGN BODY ESOPHAGEAL;  Surgeon: [name], MD;  Location: [facility];  Service: ENT;  Laterality: N/A;  . INSERT / REPLACE / REMOVE PACEMAKER    . MASTECTOMY Right 1970   Cancer  . SAVORY DILATION N/A 10/03/2012   Procedure: SAVORY DILATION;  Surgeon: [name], MD;  [facility];  Service: Endoscopy;  Laterality: N/A;    Allergies  Allergen Reactions  . Augmentin [Amoxicillin-Pot Clavulanate] Nausea And Vomiting  . Lactose Intolerance (Gi)     Outpatient Encounter Medications as of 11/13/2018  Medication Sig  . acetaminophen (TYLENOL) 650 MG CR tablet Take 650 mg by mouth 2 (two) times daily. For pain  . carboxymethylcellulose (REFRESH PLUS) 0.5 % SOLN 2 drops 2 (two) times daily.   . Dentifrices (BIOTENE DRY MOUTH CARE DT) Place 1 spray onto teeth 4 (four) times daily as needed.   Marland Kitchen escitalopram (LEXAPRO) 20 MG tablet Take 20 mg by mouth daily. For depression/ anxiety.  . furosemide (LASIX) 40 MG  tablet Take 40 mg by mouth.  Marland Kitchen GLUCERNA (GLUCERNA) LIQD Take 1 Can by mouth daily. Divided 1/2 can bid  . levothyroxine (SYNTHROID, LEVOTHROID) 75 MCG tablet Take 75 mcg by mouth daily before breakfast. For thyroid therapy  . liver oil-zinc oxide (DESITIN) 40 % ointment Apply 1 application topically as needed for irritation.  Marland Kitchen LORazepam (ATIVAN) 0.5 MG tablet Take 0.5 tablets (0.25 mg total) by mouth at bedtime.  Marland Kitchen losartan (COZAAR) 25 MG tablet Take 1 tablet (25 mg total) by mouth daily.  . metoprolol tartrate (LOPRESSOR) 25 MG tablet Take 25 mg by mouth 2 (two) times daily.  Marland Kitchen omeprazole (PRILOSEC) 20 MG capsule Take 20 mg by mouth daily.  . potassium chloride SA (K-DUR,KLOR-CON) 20 MEQ tablet Take 20 mEq by mouth daily.  Addison Lank Hazel 14 % LIQD Apply topically as needed (for hemorrhoid flare up).  . [DISCONTINUED] Docosanol (ABREVA) 10 % CREA Apply topically as needed.   No facility-administered encounter medications on file as of 11/13/2018.     Review of Systems  Constitutional: Negative for activity change, appetite change, chills, diaphoresis, fatigue, fever and unexpected weight change.  HENT: Negative for congestion.   Respiratory: Negative for cough, shortness of breath and wheezing.   Cardiovascular: Positive  for leg swelling. Negative for chest pain and palpitations.  Gastrointestinal: Negative for abdominal distention, abdominal pain, constipation and diarrhea.  Genitourinary: Negative for difficulty urinating and dysuria.  Musculoskeletal: Positive for gait problem. Negative for arthralgias, back pain, joint swelling and myalgias.  Neurological: Positive for weakness. Negative for dizziness, tremors, seizures, syncope, facial asymmetry, speech difficulty, light-headedness, numbness and headaches.  Psychiatric/Behavioral: Positive for behavioral problems and confusion. Negative for agitation.    Immunization History  Administered Date(s) Administered  . Influenza Inj Mdck  Quad Pf 12/22/2015  . Influenza Whole 12/17/2012  . Influenza,inj,Quad PF,6+ Mos 12/24/2017  . Influenza-Unspecified 12/08/2013, 12/16/2014, 12/27/2016  . PPD Test 07/18/2012  . Pneumococcal Conjugate-13 08/27/2015   Pertinent  Health Maintenance Due  Topic Date Due  . INFLUENZA VACCINE  10/04/2018  . FOOT EXAM  07/14/2019  . OPHTHALMOLOGY EXAM  09/22/2019  . PNA vac Low Risk Adult  Completed   Fall Risk  07/14/2018 04/08/2018 07/10/2017 06/27/2016 11/07/2015  Falls in the past year? 0 Exclusion - non ambulatory No No No  Number falls in past yr: 0 - - - -  Injury with Fall? 0 - - - -  Risk for fall due to : Impaired balance/gait - - - History of fall(s)   Functional Status Survey:    Vitals:   11/18/18 0801  Weight: 171 lb 9.6 oz (77.8 kg)   Body mass index is 32.42 kg/m.  Wt Readings from Last 3 Encounters:  11/18/18 171 lb 9.6 oz (77.8 kg)  10/06/18 170 lb 9.6 oz (77.4 kg)  09/09/18 170 lb (77.1 kg)   Physical Exam Vitals signs and nursing note reviewed.  Constitutional:      General: She is not in acute distress.    Appearance: She is not diaphoretic.  HENT:     Head: Normocephalic and atraumatic.  Neck:     Vascular: No JVD.  Cardiovascular:     Rate and Rhythm: Normal rate and regular rhythm.     Heart sounds: No murmur.  Pulmonary:     Effort: Pulmonary effort is normal. No respiratory distress.     Breath sounds: Normal breath sounds. No wheezing.  Abdominal:     General: Bowel sounds are normal. There is no distension.     Palpations: Abdomen is soft.  Musculoskeletal:     Right lower leg: Edema present.     Left lower leg: Edema present.  Skin:    General: Skin is warm and dry.  Neurological:     Mental Status: She is alert and oriented to person, place, and time.     Comments: Right sided hemiplegia     Labs reviewed: Recent Labs    03/18/18 0600  NA 144  K 4.4  BUN 15  CREATININE 0.5   No results for input(s): AST, ALT, ALKPHOS, BILITOT,  PROT, ALBUMIN in the last 8760 hours. No results for input(s): WBC, NEUTROABS, HGB, HCT, MCV, PLT in the last 8760 hours. Lab Results  Component Value Date   TSH 1.32 10/07/2018   Lab Results  Component Value Date   HGBA1C 7.7 10/07/2018   Lab Results  Component Value Date   CHOL 135 09/19/2015   HDL 50 09/19/2015   LDLCALC 55 09/19/2015   TRIG 149 09/19/2015   CHOLHDL 4.1 07/01/2012    Significant Diagnostic Results in last 30 days:  No results found.  Assessment/Plan  1. Vascular dementia without behavioral disturbance (HCC) Progressive with worsening short term memory loss and feeling of  paranoia. Due to this would not taper the ativan or lexapro. Continues on palliative care and goals of care are comfort based.  2. Cerebrovascular disease S/p CVA with right sided weakness Needs skilled care due to this with assistance in all ADLs  3. Chronic venous insufficiency Continue compression hose daily and lasix 40 mg qd   4. Atrioventricular block, complete ([place]) Will need to check with nursing staff at [facility] regarding remote transmission that needs to be done.   5. Essential hypertension Controlled, metoprolol 25 mg bid and losartan 25 mg qd    Family/ staff Communication: staff/resident   Labs/tests ordered:  NA

## 2018-11-28 DIAGNOSIS — M79671 Pain in right foot: Secondary | ICD-10-CM | POA: Diagnosis not present

## 2018-12-03 DIAGNOSIS — Z9189 Other specified personal risk factors, not elsewhere classified: Secondary | ICD-10-CM | POA: Diagnosis not present

## 2018-12-03 LAB — NOVEL CORONAVIRUS, NAA: SARS-CoV-2, NAA: NOT DETECTED

## 2018-12-08 DIAGNOSIS — M25571 Pain in right ankle and joints of right foot: Secondary | ICD-10-CM | POA: Diagnosis not present

## 2018-12-08 DIAGNOSIS — I69151 Hemiplegia and hemiparesis following nontraumatic intracerebral hemorrhage affecting right dominant side: Secondary | ICD-10-CM | POA: Diagnosis not present

## 2018-12-08 DIAGNOSIS — R600 Localized edema: Secondary | ICD-10-CM | POA: Diagnosis not present

## 2018-12-09 ENCOUNTER — Encounter: Payer: Self-pay | Admitting: Internal Medicine

## 2018-12-09 ENCOUNTER — Non-Acute Institutional Stay (SKILLED_NURSING_FACILITY): Payer: Medicare Other | Admitting: Internal Medicine

## 2018-12-09 DIAGNOSIS — M25571 Pain in right ankle and joints of right foot: Secondary | ICD-10-CM | POA: Diagnosis not present

## 2018-12-09 DIAGNOSIS — I5032 Chronic diastolic (congestive) heart failure: Secondary | ICD-10-CM

## 2018-12-09 DIAGNOSIS — K224 Dyskinesia of esophagus: Secondary | ICD-10-CM

## 2018-12-09 DIAGNOSIS — I69351 Hemiplegia and hemiparesis following cerebral infarction affecting right dominant side: Secondary | ICD-10-CM

## 2018-12-09 DIAGNOSIS — I872 Venous insufficiency (chronic) (peripheral): Secondary | ICD-10-CM | POA: Diagnosis not present

## 2018-12-09 DIAGNOSIS — F0150 Vascular dementia without behavioral disturbance: Secondary | ICD-10-CM

## 2018-12-09 DIAGNOSIS — R600 Localized edema: Secondary | ICD-10-CM | POA: Diagnosis not present

## 2018-12-09 DIAGNOSIS — E1142 Type 2 diabetes mellitus with diabetic polyneuropathy: Secondary | ICD-10-CM | POA: Diagnosis not present

## 2018-12-09 DIAGNOSIS — I69151 Hemiplegia and hemiparesis following nontraumatic intracerebral hemorrhage affecting right dominant side: Secondary | ICD-10-CM | POA: Diagnosis not present

## 2018-12-09 NOTE — Progress Notes (Signed)
Patient ID: Rachel Cooke, female   DOB: 1919/04/25, 85 y.o.   MRN: [id number]  Location:  [facility] Room Number: [place] of Service:  SNF [phone]) Provider:   [name], DO  Patient Care Team: [name], DO as PCP - General (Geriatric Medicine) Community, Well Gwenette Greet, MD as Attending Physician (Gastroenterology) Garvin Fila, MD as Consulting Physician (Neurology) Julianne Handler, NP as Nurse Practitioner (Hospice and Palliative Medicine) Luberta Mutter, MD as Consulting Physician (Ophthalmology)  Extended Emergency Contact Information Primary Emergency Contact: [place] Address: [address]          [place], [place] [address] [name] of [place] Phone: [phone] Mobile Phone: [phone] Relation: Daughter  Code Status:  DNR; palliative care  Goals of care: Advanced Directive information Advanced Directives 07/14/2018  Does Patient Have a Medical Advance Directive? Yes  Type of Paramedic of [place];Out of facility DNR (pink MOST or yellow form)  Does patient want to make changes to medical advance directive? -  Copy of [place] in Chart? -  Pre-existing out of facility DNR order (yellow form or pink MOST form) Yellow form placed in chart (order not valid for inpatient use);Pink MOST form placed in chart (order not valid for inpatient use)     Chief Complaint  Patient presents with  . Medical Management of Chronic Issues    Routine visit at [facility]   . Immunizations    Flu vaccine will be administered at facility this month    HPI:  Pt is a 85 y.o. female seen today for medical management of chronic diseases.  She had an injury to her right foot on 9/14 when she ran over it herself in her power chair.  She was using the hoyer lift temporarily due to pain in this foot as best I can tell.  It was  ecchymotic, swollen, and painful especially on her great toe.  Tylenol and ice were used for pain.  It has since improved and xrays on 9/25 were negative for fracture showing only arthritic changes and hammertoe deformity.    When I first went by to see her today, she was facetiming with family.  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  She scored 18/30 on her MoCA with difficulties in short-term recall, visuospatial skills and reaction time all crucial for operating any type of motor vehicle.    Past Medical History:  Diagnosis Date  . Abnormality of gait 2007  . Acquired cyst of kidney 2002  . Acute sinus infection 11/25/2012  . Allergic rhinitis due to pollen   . Altered mental status 11/28/2012  . Anxiety state, unspecified   . Atrioventricular block, complete ([place]) 2003   s/p PPM 2003, generator chnage 2011  . Cardiac pacemaker in situ 2003  . Coronary atherosclerosis of native coronary artery 2003   non obstructive by Cath 2003  . CVA (cerebral infarction) 06/28/2012   rt hemiparesis, dysphagia  . Dendritic keratitis 2012  . Dysuria   . Edema 2003  . Esophageal dysmotility 08/19/2012  . Food impaction of esophagus 08/15/2012  . HTN (hypertension) 06/28/2012  . Hypertension   . Macular degeneration (senile) of retina, unspecified 2012  . Malignant neoplasm of breast (female), unspecified site 1970   S/P Rt radical mastectomy  . NSTEMI (non-ST elevated myocardial  infarction) ([place]) 06/28/2012  . Osteoarthrosis, unspecified whether generalized or localized, unspecified site 2013  . Pacemaker   . Personal history of colonic polyps     s/p colonoscopy/polypectomy 2003  . Pure hyperglyceridemia 2004  . Senile osteoporosis 2003  . Stroke ([place]) 06/28/2012  . Tinnitus 2007  . Type II or unspecified type diabetes mellitus without mention of complication, not stated as uncontrolled 2003  . Unspecified arthropathy,  pelvic region and thigh 2010  . Unspecified glaucoma(365.9) 2013  . Unspecified hypothyroidism 2009  . Unspecified vitamin D deficiency 2009  . Urinary retention 07/07/2012   Past Surgical History:  Procedure Laterality Date  . BOTOX INJECTION N/A 10/03/2012   Procedure: BOTOX INJECTION;  Surgeon: [name], MD;  [facility];  Service: Endoscopy;  Laterality: N/A;  . ESOPHAGOGASTRODUODENOSCOPY N/A 08/15/2012   Procedure: ESOPHAGOGASTRODUODENOSCOPY (EGD);  Surgeon: [name], MD;  Location: [facility] ENDOSCOPY;  Service: Endoscopy;  Laterality: N/A;  . ESOPHAGOGASTRODUODENOSCOPY N/A 08/15/2012   Procedure: ESOPHAGOGASTRODUODENOSCOPY (EGD);  Surgeon: [name], MD;  Location: [facility];  Service: Endoscopy;  Laterality: N/A;  . ESOPHAGOGASTRODUODENOSCOPY N/A 10/03/2012   Procedure: ESOPHAGOGASTRODUODENOSCOPY (EGD);  Surgeon: [name], MD;  Location: [facility];  Service: Endoscopy;  Laterality: N/A;  . ESOPHAGOGASTRODUODENOSCOPY N/A 12/22/2012   Procedure: ESOPHAGOGASTRODUODENOSCOPY (EGD);  Surgeon: [name], MD;  Location: [facility] ENDOSCOPY;  Service: Endoscopy;  Laterality: N/A;  . ESOPHAGOSCOPY N/A 08/15/2012   Procedure: ESOPHAGOSCOPY;  Surgeon: [name], MD;  Location: [facility];  Service: ENT;  Laterality: N/A;  . FOREIGN BODY REMOVAL ESOPHAGEAL N/A 08/15/2012   Procedure: REMOVAL FOREIGN BODY ESOPHAGEAL;  Surgeon: [name], MD;  Location: [facility];  Service: ENT;  Laterality: N/A;  . INSERT / REPLACE / REMOVE PACEMAKER    . MASTECTOMY Right 1970   Cancer  . SAVORY DILATION N/A 10/03/2012   Procedure: SAVORY DILATION;  Surgeon: [name], MD;  [facility];  Service: Endoscopy;  Laterality: N/A;    Allergies  Allergen Reactions  . Augmentin [Amoxicillin-Pot Clavulanate] Nausea And Vomiting  . Lactose Intolerance (Gi)     Outpatient Encounter Medications as of 12/09/2018  Medication Sig  . acetaminophen (TYLENOL) 650 MG CR tablet Take 650 mg by mouth 2 (two) times  daily. For pain and every 4 hours as needed for up to 72 hours, document area of discomfort and effectiveness or medication  . carboxymethylcellulose (REFRESH PLUS) 0.5 % SOLN 2 drops 2 (two) times daily.   . Dentifrices (BIOTENE DRY MOUTH CARE DT) Take 1 spray by mouth 3 (three) times daily as needed.   Marland Kitchen escitalopram (LEXAPRO) 20 MG tablet Take 20 mg by mouth daily. For depression/ anxiety.  . furosemide (LASIX) 40 MG tablet Take 40 mg by mouth.  Marland Kitchen GLUCERNA (GLUCERNA) LIQD Take 1 Can by mouth daily. Divided 1/2 can with breakfast and 1/2 can at lunch  . levothyroxine (SYNTHROID, LEVOTHROID) 75 MCG tablet Take 75 mcg by mouth daily before breakfast. For thyroid therapy  . liver oil-zinc oxide (DESITIN) 40 % ointment Apply 1 application topically as needed for irritation (to buttocks and inner thighs).   . LORazepam (ATIVAN) 0.5 MG tablet Take 0.5 tablets (0.25 mg total) by mouth at bedtime.  Marland Kitchen losartan (COZAAR) 50 MG tablet Take 25 mg by mouth daily. In the morning  . metoprolol tartrate (LOPRESSOR) 25 MG tablet Take 25 mg by mouth 2 (two) times daily.  Marland Kitchen omeprazole (PRILOSEC) 20 MG capsule Take 20 mg by  mouth daily.  . potassium chloride SA (K-DUR,KLOR-CON) 20 MEQ tablet Take 20 mEq by mouth daily.  Addison Lank Hazel 14 % LIQD Apply topically as needed (for hemorrhoid flare up).  . [DISCONTINUED] losartan (COZAAR) 25 MG tablet Take 1 tablet (25 mg total) by mouth daily.   No facility-administered encounter medications on file as of 12/09/2018.     Review of Systems  Constitutional: Positive for fatigue. Negative for activity change, appetite change, chills and fever.  HENT: Positive for trouble swallowing. Negative for congestion.   Eyes: Negative for visual disturbance.  Respiratory: Negative for cough and shortness of breath.   Cardiovascular: Negative for chest pain, palpitations and leg swelling.  Gastrointestinal: Negative for abdominal pain, constipation, diarrhea, nausea and vomiting.    Genitourinary: Negative for dysuria.  Musculoskeletal: Positive for gait problem.  Skin: Negative for color change.    Immunization History  Administered Date(s) Administered  . Influenza Inj Mdck Quad Pf 12/22/2015  . Influenza Whole 12/17/2012  . Influenza,inj,Quad PF,6+ Mos 12/24/2017  . Influenza-Unspecified 12/08/2013, 12/16/2014, 12/27/2016  . PPD Test 07/18/2012  . Pneumococcal Conjugate-13 08/27/2015   Pertinent  Health Maintenance Due  Topic Date Due  . INFLUENZA VACCINE  10/04/2018  . FOOT EXAM  07/14/2019  . OPHTHALMOLOGY EXAM  09/22/2019  . PNA vac Low Risk Adult  Completed   Fall Risk  07/14/2018 04/08/2018 07/10/2017 06/27/2016 11/07/2015  Falls in the past year? 0 Exclusion - non ambulatory No No No  Number falls in past yr: 0 - - - -  Injury with Fall? 0 - - - -  Risk for fall due to : Impaired balance/gait - - - History of fall(s)     Vitals:   12/09/18 1056  BP: 130/67  Pulse: 88  Resp: 20  Temp: 97.6 F (36.4 C)  SpO2: 94%  Weight: 172 lb (78 kg)  Height: 5\' 1"  (1.549 m)   Body mass index is 32.5 kg/m. Physical Exam Vitals signs and nursing note reviewed.  Constitutional:      General: She is not in acute distress.    Appearance: Normal appearance. She is not ill-appearing or toxic-appearing.  HENT:     Head: Normocephalic and atraumatic.  Cardiovascular:     Rate and Rhythm: Normal rate and regular rhythm.     Pulses: Normal pulses.     Heart sounds: Normal heart sounds.  Pulmonary:     Effort: Pulmonary effort is normal.     Breath sounds: Normal breath sounds.  Abdominal:     General: Bowel sounds are normal.  Musculoskeletal:     Right lower leg: Edema present.     Left lower leg: Edema present.  Skin:    General: Skin is warm and dry.     Capillary Refill: Capillary refill takes less than 2 seconds.  Neurological:     General: No focal deficit present.     Mental Status: She is alert.     Comments: Short-term memory notably  declining with repetition over just a few minutes; right hemiparesis  Psychiatric:        Mood and Affect: Mood normal.     Labs reviewed: Recent Labs    03/18/18 0600  NA 144  K 4.4  BUN 15  CREATININE 0.5   No results for input(s): AST, ALT, ALKPHOS, BILITOT, PROT, ALBUMIN in the last 8760 hours. No results for input(s): WBC, NEUTROABS, HGB, HCT, MCV, PLT in the last 8760 hours. Lab Results  Component Value Date  TSH 1.32 10/07/2018   Lab Results  Component Value Date   HGBA1C 7.7 10/07/2018   Lab Results  Component Value Date   CHOL 135 09/19/2015   HDL 50 09/19/2015   LDLCALC 55 09/19/2015   TRIG 149 09/19/2015   CHOLHDL 4.1 07/01/2012    Significant Diagnostic Results in last 30 days:  Reviewed xrays of foot  Assessment/Plan 1. Vascular dementia without behavioral disturbance ([place]) -cognition declining and now limiting her safety with power chair use -OT completing eval today on this and MoCA shows she needs her license restricted due to the areas of concern above--limited at present anyway with covid quarantine  2. Hemiparesis affecting right side as late effect of cerebrovascular accident ([place]) -cont standup lift now which she's been able to do now that foot feels better   3. Chronic venous insufficiency -remains, elevate feet at rest  4. Esophageal dysmotility -continues on restricted diet with a few comfort foods  -often forgets about diet restrictions and requests other things; but, sadly, has had impaction concerns in the past   5. Controlled type 2 diabetes mellitus with diabetic polyneuropathy, without long-term current use of insulin (HCC) -sugar average ok for her age and comorbidities and functional status Lab Results  Component Value Date   HGBA1C 7.7 10/07/2018    6. Chronic diastolic congestive heart failure (HCC) -no signs of acute volume overload -wt stable from last month  Family/ staff Communication: discussed with SNF nurse and  OT  Labs/tests ordered:  No new  Jordon Bourquin L. Abigaelle Verley, D.O. [facility] [address], [place] [address] Cell Phone (Mon-Fri 8am-5pm):  [phone] On Call:  [phone] & follow prompts after 5pm & weekends Office Phone:  [phone] Office Fax:  [phone]

## 2018-12-12 ENCOUNTER — Other Ambulatory Visit: Payer: Self-pay | Admitting: *Deleted

## 2018-12-16 DIAGNOSIS — I69151 Hemiplegia and hemiparesis following nontraumatic intracerebral hemorrhage affecting right dominant side: Secondary | ICD-10-CM | POA: Diagnosis not present

## 2018-12-16 DIAGNOSIS — M25571 Pain in right ankle and joints of right foot: Secondary | ICD-10-CM | POA: Diagnosis not present

## 2018-12-16 DIAGNOSIS — R600 Localized edema: Secondary | ICD-10-CM | POA: Diagnosis not present

## 2018-12-17 DIAGNOSIS — Z9189 Other specified personal risk factors, not elsewhere classified: Secondary | ICD-10-CM | POA: Diagnosis not present

## 2018-12-19 DIAGNOSIS — M25571 Pain in right ankle and joints of right foot: Secondary | ICD-10-CM | POA: Diagnosis not present

## 2018-12-19 DIAGNOSIS — R600 Localized edema: Secondary | ICD-10-CM | POA: Diagnosis not present

## 2018-12-19 DIAGNOSIS — I69151 Hemiplegia and hemiparesis following nontraumatic intracerebral hemorrhage affecting right dominant side: Secondary | ICD-10-CM | POA: Diagnosis not present

## 2018-12-22 DIAGNOSIS — Z9189 Other specified personal risk factors, not elsewhere classified: Secondary | ICD-10-CM | POA: Diagnosis not present

## 2018-12-23 DIAGNOSIS — M25571 Pain in right ankle and joints of right foot: Secondary | ICD-10-CM | POA: Diagnosis not present

## 2018-12-23 DIAGNOSIS — R600 Localized edema: Secondary | ICD-10-CM | POA: Diagnosis not present

## 2018-12-23 DIAGNOSIS — I69151 Hemiplegia and hemiparesis following nontraumatic intracerebral hemorrhage affecting right dominant side: Secondary | ICD-10-CM | POA: Diagnosis not present

## 2018-12-24 DIAGNOSIS — R600 Localized edema: Secondary | ICD-10-CM | POA: Diagnosis not present

## 2018-12-24 DIAGNOSIS — I69151 Hemiplegia and hemiparesis following nontraumatic intracerebral hemorrhage affecting right dominant side: Secondary | ICD-10-CM | POA: Diagnosis not present

## 2018-12-24 DIAGNOSIS — M25571 Pain in right ankle and joints of right foot: Secondary | ICD-10-CM | POA: Diagnosis not present

## 2018-12-29 ENCOUNTER — Other Ambulatory Visit: Payer: Self-pay | Admitting: Adult Health

## 2018-12-29 DIAGNOSIS — Z9189 Other specified personal risk factors, not elsewhere classified: Secondary | ICD-10-CM | POA: Diagnosis not present

## 2018-12-29 MED ORDER — LORAZEPAM 0.5 MG PO TABS: 0.2500 mg | ORAL_TABLET | Freq: Every day | ORAL | 2 refills | Status: DC

## 2018-12-30 DIAGNOSIS — R600 Localized edema: Secondary | ICD-10-CM | POA: Diagnosis not present

## 2018-12-30 DIAGNOSIS — I69151 Hemiplegia and hemiparesis following nontraumatic intracerebral hemorrhage affecting right dominant side: Secondary | ICD-10-CM | POA: Diagnosis not present

## 2018-12-30 DIAGNOSIS — M25571 Pain in right ankle and joints of right foot: Secondary | ICD-10-CM | POA: Diagnosis not present

## 2018-12-31 DIAGNOSIS — I69151 Hemiplegia and hemiparesis following nontraumatic intracerebral hemorrhage affecting right dominant side: Secondary | ICD-10-CM | POA: Diagnosis not present

## 2018-12-31 DIAGNOSIS — M25571 Pain in right ankle and joints of right foot: Secondary | ICD-10-CM | POA: Diagnosis not present

## 2018-12-31 DIAGNOSIS — R600 Localized edema: Secondary | ICD-10-CM | POA: Diagnosis not present

## 2019-01-01 ENCOUNTER — Non-Acute Institutional Stay (SKILLED_NURSING_FACILITY): Payer: Medicare Other | Admitting: Adult Health

## 2019-01-01 ENCOUNTER — Encounter: Payer: Self-pay | Admitting: Adult Health

## 2019-01-01 DIAGNOSIS — I872 Venous insufficiency (chronic) (peripheral): Secondary | ICD-10-CM

## 2019-01-01 DIAGNOSIS — M25571 Pain in right ankle and joints of right foot: Secondary | ICD-10-CM | POA: Diagnosis not present

## 2019-01-01 NOTE — Progress Notes (Signed)
Location:  Occupational psychologist of Service:  SNF (31) Provider:   Cindi Carbon, ANP [place] [phone]   [name], DO  Patient Care Team: [name], DO as PCP - General (Geriatric Medicine) Community, [name], Altamese Dilling, MD as Attending Physician (Gastroenterology) Garvin Fila, MD as Consulting Physician (Neurology) Julianne Handler, NP as Nurse Practitioner (Hospice and Palliative Medicine) Luberta Mutter, MD as Consulting Physician (Ophthalmology)  Extended Emergency Contact Information Primary Emergency Contact: [place] Address: [address]          [place], [place] [address] [name] of [place] Phone: [phone] Mobile Phone: [phone] Relation: Daughter  Code Status:  DNR Goals of care: Advanced Directive information Advanced Directives 07/14/2018  Does Patient Have a Medical Advance Directive? Yes  Type of Paramedic of [place];Out of facility DNR (pink MOST or yellow form)  Does patient want to make changes to medical advance directive? -  Copy of [place] in Chart? -  Pre-existing out of facility DNR order (yellow form or pink MOST form) Yellow form placed in chart (order not valid for inpatient use);Pink MOST form placed in chart (order not valid for inpatient use)     Chief Complaint  Patient presents with  . Acute Visit    right foot and ankle pain    HPI:  Pt is a 85 y.o. female seen today for an acute visit for right foot and ankle pain. The has been intermittent and present for one month to the ankle and bottom of the foot. She is not ambulatory and due to a prior CVA with right sided weakness. She bumped her foot while it was sticking out in her power wheelchair and an xray was ordered which did not show a fracture one month ago. She avoid bearing weight on it due to pain. Uses tylenol for pain with relief. The right  ankle is more swollen than the left which is chronic but possibly slightly worse than normal. She has chronic numbness in the right foot as well due to her hx of CVA.    11/28/2018 Right foot xray no acute fracture identified. There is mild halluw valgus and degenerative changes    Past Medical History:  Diagnosis Date  . Abnormality of gait 2007  . Acquired cyst of kidney 2002  . Acute sinus infection 11/25/2012  . Allergic rhinitis due to pollen   . Altered mental status 11/28/2012  . Anxiety state, unspecified   . Atrioventricular block, complete ([place]) 2003   s/p PPM 2003, generator chnage 2011  . Cardiac pacemaker in situ 2003  . Coronary atherosclerosis of native coronary artery 2003   non obstructive by Cath 2003  . CVA (cerebral infarction) 06/28/2012   rt hemiparesis, dysphagia  . Dendritic keratitis 2012  . Dysuria   . Edema 2003  . Esophageal dysmotility 08/19/2012  . Food impaction of esophagus 08/15/2012  . HTN (hypertension) 06/28/2012  . Hypertension   . Macular degeneration (senile) of retina, unspecified 2012  . Malignant neoplasm of breast (female), unspecified site 1970   S/P Rt radical mastectomy  . NSTEMI (non-ST elevated myocardial infarction) ([place]) 06/28/2012  . Osteoarthrosis, unspecified whether generalized or localized, unspecified site 2013  . Pacemaker   . Personal history of colonic polyps     s/p colonoscopy/polypectomy 2003  . Pure hyperglyceridemia 2004  . Senile osteoporosis 2003  . Stroke ([place]) 06/28/2012  . Tinnitus 2007  .  Type II or unspecified type diabetes mellitus without mention of complication, not stated as uncontrolled 2003  . Unspecified arthropathy, pelvic region and thigh 2010  . Unspecified glaucoma(365.9) 2013  . Unspecified hypothyroidism 2009  . Unspecified vitamin D deficiency 2009  . Urinary retention 07/07/2012   Past Surgical History:  Procedure Laterality Date  . BOTOX INJECTION N/A 10/03/2012   Procedure: BOTOX INJECTION;   Surgeon: [name], MD;  Location: [facility];  Service: Endoscopy;  Laterality: N/A;  . ESOPHAGOGASTRODUODENOSCOPY N/A 08/15/2012   Procedure: ESOPHAGOGASTRODUODENOSCOPY (EGD);  Surgeon: [name], MD;  Location: [facility] ENDOSCOPY;  Service: Endoscopy;  Laterality: N/A;  . ESOPHAGOGASTRODUODENOSCOPY N/A 08/15/2012   Procedure: ESOPHAGOGASTRODUODENOSCOPY (EGD);  Surgeon: [name], MD;  Location: [place];  Service: Endoscopy;  Laterality: N/A;  . ESOPHAGOGASTRODUODENOSCOPY N/A 10/03/2012   Procedure: ESOPHAGOGASTRODUODENOSCOPY (EGD);  Surgeon: [name], MD;  Location: [facility];  Service: Endoscopy;  Laterality: N/A;  . ESOPHAGOGASTRODUODENOSCOPY N/A 12/22/2012   Procedure: ESOPHAGOGASTRODUODENOSCOPY (EGD);  Surgeon: [name], MD;  Location: [facility] ENDOSCOPY;  Service: Endoscopy;  Laterality: N/A;  . ESOPHAGOSCOPY N/A 08/15/2012   Procedure: ESOPHAGOSCOPY;  Surgeon: [name], MD;  Location: [place];  Service: ENT;  Laterality: N/A;  . FOREIGN BODY REMOVAL ESOPHAGEAL N/A 08/15/2012   Procedure: REMOVAL FOREIGN BODY ESOPHAGEAL;  Surgeon: [name], MD;  Location: [place];  Service: ENT;  Laterality: N/A;  . INSERT / REPLACE / REMOVE PACEMAKER    . MASTECTOMY Right 1970   Cancer  . SAVORY DILATION N/A 10/03/2012   Procedure: SAVORY DILATION;  Surgeon: [name], MD;  [facility];  Service: Endoscopy;  Laterality: N/A;    Allergies  Allergen Reactions  . Augmentin [Amoxicillin-Pot Clavulanate] Nausea And Vomiting  . Lactose Intolerance (Gi)     Outpatient Encounter Medications as of 01/01/2019  Medication Sig  . acetaminophen (TYLENOL) 650 MG CR tablet Take 650 mg by mouth 2 (two) times daily. For pain and every 4 hours as needed for up to 72 hours, document area of discomfort and effectiveness or medication  . carboxymethylcellulose (REFRESH PLUS) 0.5 % SOLN 2 drops 2 (two) times daily.   . Dentifrices (BIOTENE DRY MOUTH CARE DT) Take 1 spray by mouth 3 (three) times  daily as needed.   Marland Kitchen escitalopram (LEXAPRO) 20 MG tablet Take 20 mg by mouth daily. For depression/ anxiety.  . furosemide (LASIX) 40 MG tablet Take 40 mg by mouth.  Marland Kitchen GLUCERNA (GLUCERNA) LIQD Take 1 Can by mouth daily. Divided 1/2 can with breakfast and 1/2 can at lunch  . levothyroxine (SYNTHROID, LEVOTHROID) 75 MCG tablet Take 75 mcg by mouth daily before breakfast. For thyroid therapy  . liver oil-zinc oxide (DESITIN) 40 % ointment Apply 1 application topically as needed for irritation (to buttocks and inner thighs).   . LORazepam (ATIVAN) 0.5 MG tablet Take 0.5 tablets (0.25 mg total) by mouth at bedtime.  Marland Kitchen losartan (COZAAR) 50 MG tablet Take 25 mg by mouth daily. In the morning  . metoprolol tartrate (LOPRESSOR) 25 MG tablet Take 25 mg by mouth 2 (two) times daily.  Marland Kitchen omeprazole (PRILOSEC) 20 MG capsule Take 20 mg by mouth daily.  . potassium chloride SA (K-DUR,KLOR-CON) 20 MEQ tablet Take 20 mEq by mouth daily.  Addison Lank Hazel 14 % LIQD Apply topically as needed (for hemorrhoid flare up).   No facility-administered encounter medications on file as of 01/01/2019.     Review of Systems  Constitutional: Positive  for activity change. Negative for appetite change, chills, diaphoresis, fatigue, fever and unexpected weight change.  HENT: Negative for congestion.   Respiratory: Negative for cough, shortness of breath and wheezing.   Cardiovascular: Positive for leg swelling. Negative for chest pain and palpitations.  Gastrointestinal: Negative for abdominal distention, abdominal pain, constipation and diarrhea.  Genitourinary: Negative for difficulty urinating and dysuria.  Musculoskeletal: Positive for gait problem and joint swelling. Negative for arthralgias, back pain and myalgias.       Right ankle and foot pain  Neurological: Positive for numbness. Negative for dizziness, tremors, seizures, syncope, facial asymmetry, speech difficulty, weakness, light-headedness and headaches.   Psychiatric/Behavioral: Negative for agitation, behavioral problems and confusion.    Immunization History  Administered Date(s) Administered  . Influenza Inj Mdck Quad Pf 12/22/2015  . Influenza Whole 12/17/2012  . Influenza, High Dose Seasonal PF 12/18/2018  . Influenza,inj,Quad PF,6+ Mos 12/24/2017  . Influenza-Unspecified 12/08/2013, 12/16/2014, 12/27/2016  . PPD Test 07/18/2012  . Pneumococcal Conjugate-13 08/27/2015   Pertinent  Health Maintenance Due  Topic Date Due  . FOOT EXAM  07/14/2019  . OPHTHALMOLOGY EXAM  09/22/2019  . INFLUENZA VACCINE  Completed  . PNA vac Low Risk Adult  Completed   Fall Risk  07/14/2018 04/08/2018 07/10/2017 06/27/2016 11/07/2015  Falls in the past year? 0 Exclusion - non ambulatory No No No  Number falls in past yr: 0 - - - -  Injury with Fall? 0 - - - -  Risk for fall due to : Impaired balance/gait - - - History of fall(s)   Functional Status Survey:    Vitals:   01/01/19 1202  Temp: 98.2 F (36.8 C)   There is no height or weight on file to calculate BMI. Physical Exam Vitals signs and nursing note reviewed.  Constitutional:      Appearance: Normal appearance.  Musculoskeletal:        General: Tenderness present. No deformity.     Right lower leg: Edema present.     Left lower leg: Edema present.  Skin:    General: Skin is warm and dry.     Comments: Rubor discoloration to the heel area of the right foot. Mild ankle joint laxity noted on anterior drawer test. Tenderness noted at the lateral malleolus, medial malleolus, and heel area on the right. Mild swelling noted. No warmth. +BPPP.  Normal ROM to the right ankle is noted.   Neurological:     Mental Status: She is alert.  Psychiatric:        Mood and Affect: Mood normal.     Labs reviewed: Recent Labs    03/18/18 0600  NA 144  K 4.4  BUN 15  CREATININE 0.5   No results for input(s): AST, ALT, ALKPHOS, BILITOT, PROT, ALBUMIN in the last 8760 hours. No results for  input(s): WBC, NEUTROABS, HGB, HCT, MCV, PLT in the last 8760 hours. Lab Results  Component Value Date   TSH 1.32 10/07/2018   Lab Results  Component Value Date   HGBA1C 7.7 10/07/2018   Lab Results  Component Value Date   CHOL 135 09/19/2015   HDL 50 09/19/2015   LDLCALC 55 09/19/2015   TRIG 149 09/19/2015   CHOLHDL 4.1 07/01/2012    Significant Diagnostic Results in last 30 days:  No results found.  Assessment/Plan  1. Acute right ankle pain Rest, ice and tylenol as needed. Avoid nsaids due to her health hx and age if possible. Apply walking boot.  Keep elevated  Check 3 view xray. This is likely a sprain rather than a fracture. Continue with OT.  2. Chronic venous insufficiency Continue compression hose on in the am and off in the pm   Family/ staff Communication: nurse to discuss with her family  Labs/tests ordered:  3 view of the right ankle

## 2019-01-02 ENCOUNTER — Non-Acute Institutional Stay (SKILLED_NURSING_FACILITY): Payer: Medicare Other | Admitting: Adult Health

## 2019-01-02 ENCOUNTER — Encounter: Payer: Self-pay | Admitting: Adult Health

## 2019-01-02 DIAGNOSIS — M25571 Pain in right ankle and joints of right foot: Secondary | ICD-10-CM | POA: Diagnosis not present

## 2019-01-02 DIAGNOSIS — Z0000 Encounter for general adult medical examination without abnormal findings: Secondary | ICD-10-CM | POA: Diagnosis not present

## 2019-01-02 DIAGNOSIS — M79671 Pain in right foot: Secondary | ICD-10-CM | POA: Diagnosis not present

## 2019-01-02 NOTE — Progress Notes (Addendum)
Subjective:   Rachel Cooke is a 85 y.o. female who presents for Medicare Annual (Subsequent) preventive examination [employer], skilled care.  Review of Systems:   Cardiac Risk Factors include: advanced age (>30men, >66 women);diabetes mellitus;sedentary lifestyle     Objective:     Vitals: There were no vitals taken for this visit.  There is no height or weight on file to calculate BMI.  Advanced Directives 01/02/2019 07/14/2018 06/17/2018 04/01/2018 07/30/2017 07/10/2017 06/25/2017  Does Patient Have a Medical Advance Directive? Yes Yes Yes Yes Yes Yes Yes  Type of Paramedic of [place];Out of facility DNR (pink MOST or yellow form);Living will [place];Out of facility DNR (pink MOST or yellow form) [place];Out of facility DNR (pink MOST or yellow form) [place];Living will;Out of facility DNR (pink MOST or yellow form) [place];Living will;Out of facility DNR (pink MOST or yellow form) [place];Living will;Out of facility DNR (pink MOST or yellow form) [place];Living will;Out of facility DNR (pink MOST or yellow form)  Does patient want to make changes to medical advance directive? No - Patient declined - No - Patient declined No - Patient declined No - Patient declined No - Patient declined No - Patient declined  Copy of [place] in Chart? Yes - validated most recent copy scanned in chart (See row information) - Yes - validated most recent copy scanned in chart (See row information) Yes - validated most recent copy scanned in chart (See row information) Yes Yes Yes  Pre-existing out of facility DNR order (yellow form or pink MOST form) - Yellow form placed in chart (order not valid for inpatient use);Pink MOST form placed in chart (order not valid for inpatient use) Yellow form placed in chart (order not  valid for inpatient use);Pink MOST form placed in chart (order not valid for inpatient use) Yellow form placed in chart (order not valid for inpatient use);Pink MOST form placed in chart (order not valid for inpatient use) Yellow form placed in chart (order not valid for inpatient use);Pink MOST form placed in chart (order not valid for inpatient use) Yellow form placed in chart (order not valid for inpatient use);Pink MOST form placed in chart (order not valid for inpatient use) Yellow form placed in chart (order not valid for inpatient use);Pink MOST form placed in chart (order not valid for inpatient use)    Tobacco Social History   Tobacco Use  Smoking Status Former Smoker   Packs/day: 0.25   Years: 3.00   Pack years: 0.75  Smokeless Tobacco Never Used     Counseling given: Not Answered   Clinical Intake:  Pre-visit preparation completed: No  Pain : No/denies pain     Diabetes: Yes CBG done?: No Did pt. bring in CBG monitor from home?: No  How often do you need to have someone help you when you read instructions, pamphlets, or other written materials from your doctor or pharmacy?: 4 - Often What is the last grade level you completed in school?: 2 years of college , worked in the [employer]?: No  Information entered by :: Royal Hawthorn NP  Past Medical History:  Diagnosis Date   Abnormality of gait 2007   Acquired cyst of kidney 2002   Acute sinus infection 11/25/2012   Allergic rhinitis due to pollen    Altered mental status 11/28/2012   Anxiety state, unspecified  Atrioventricular block, complete ([place]) 2003   s/p PPM 2003, generator chnage 2011   Cardiac pacemaker in situ 2003   Coronary atherosclerosis of native coronary artery 2003   non obstructive by Cath 2003   CVA (cerebral infarction) 06/28/2012   rt hemiparesis, dysphagia   Dendritic keratitis 2012   Dysuria    Edema 2003   Esophageal dysmotility 08/19/2012   Food  impaction of esophagus 08/15/2012   HTN (hypertension) 06/28/2012   Hypertension    Macular degeneration (senile) of retina, unspecified 2012   Malignant neoplasm of breast (female), unspecified site 1970   S/P Rt radical mastectomy   NSTEMI (non-ST elevated myocardial infarction) ([place]) 06/28/2012   Osteoarthrosis, unspecified whether generalized or localized, unspecified site 2013   Pacemaker    Personal history of colonic polyps     s/p colonoscopy/polypectomy 2003   Pure hyperglyceridemia 2004   Senile osteoporosis 2003   Stroke ([place]) 06/28/2012   Tinnitus 2007   Type II or unspecified type diabetes mellitus without mention of complication, not stated as uncontrolled 2003   Unspecified arthropathy, pelvic region and thigh 2010   Unspecified glaucoma(365.9) 2013   Unspecified hypothyroidism 2009   Unspecified vitamin D deficiency 2009   Urinary retention 07/07/2012   Past Surgical History:  Procedure Laterality Date   BOTOX INJECTION N/A 10/03/2012   Procedure: BOTOX INJECTION;  Surgeon: [name], MD;  [facility];  Service: Endoscopy;  Laterality: N/A;   ESOPHAGOGASTRODUODENOSCOPY N/A 08/15/2012   Procedure: ESOPHAGOGASTRODUODENOSCOPY (EGD);  Surgeon: [name], MD;  Location: [facility] ENDOSCOPY;  Service: Endoscopy;  Laterality: N/A;   ESOPHAGOGASTRODUODENOSCOPY N/A 08/15/2012   Procedure: ESOPHAGOGASTRODUODENOSCOPY (EGD);  Surgeon: [name], MD;  Location: [place];  Service: Endoscopy;  Laterality: N/A;   ESOPHAGOGASTRODUODENOSCOPY N/A 10/03/2012   Procedure: ESOPHAGOGASTRODUODENOSCOPY (EGD);  Surgeon: [name], MD;  Location: [facility];  Service: Endoscopy;  Laterality: N/A;   ESOPHAGOGASTRODUODENOSCOPY N/A 12/22/2012   Procedure: ESOPHAGOGASTRODUODENOSCOPY (EGD);  Surgeon: [name], MD;  Location: [facility] ENDOSCOPY;  Service: Endoscopy;  Laterality: N/A;   ESOPHAGOSCOPY N/A 08/15/2012   Procedure: ESOPHAGOSCOPY;  Surgeon: [name], MD;   Location: [place];  Service: ENT;  Laterality: N/A;   FOREIGN BODY REMOVAL ESOPHAGEAL N/A 08/15/2012   Procedure: REMOVAL FOREIGN BODY ESOPHAGEAL;  Surgeon: [name], MD;  Location: [place];  Service: ENT;  Laterality: N/A;   INSERT / REPLACE / REMOVE PACEMAKER     MASTECTOMY Right 1970   Cancer   SAVORY DILATION N/A 10/03/2012   Procedure: SAVORY DILATION;  Surgeon: [name], MD;  [facility];  Service: Endoscopy;  Laterality: N/A;   Family History  Problem Relation Age of Onset   Hypertension Father    Social History   Socioeconomic History   Marital status: Widowed    Spouse name: Not on file   Number of children: 3   Years of education: 12th   Highest education level: Not on file  Occupational History   Occupation: retired  Scientist, product/process development strain: Not hard at [employer]    Worry: Never true    Inability: Never true   Transportation needs    Medical: No    Non-medical: No  Tobacco Use   Smoking status: Former Smoker    Packs/day: 0.25    Years: 3.00    Pack years: 0.75   Smokeless tobacco: Never Used  Substance and Sexual Activity   Alcohol use: No  Drug use: No   Sexual activity: Never  Lifestyle   Physical activity    Days per week: 0 days    Minutes per session: 0 min   Stress: Only a little  Relationships   Social connections    Talks on phone: More than three times a week    Gets together: More than three times a week    Attends religious service: Never    Active member of club or organization: No    Attends meetings of clubs or organizations: Never    Relationship status: Widowed  Other Topics Concern   Not on file  Social History Narrative   Not on file    Outpatient Encounter Medications as of 01/02/2019  Medication Sig   acetaminophen (TYLENOL) 650 MG CR tablet Take 650 mg by mouth 2 (two) times daily. For pain and every 4 hours as needed for up to 72 hours, document area of  discomfort and effectiveness or medication   carboxymethylcellulose (REFRESH PLUS) 0.5 % SOLN 2 drops 2 (two) times daily.    Dentifrices (BIOTENE DRY MOUTH CARE DT) Take 1 spray by mouth 3 (three) times daily as needed.    escitalopram (LEXAPRO) 20 MG tablet Take 20 mg by mouth daily. For depression/ anxiety.   furosemide (LASIX) 40 MG tablet Take 40 mg by mouth.   GLUCERNA (GLUCERNA) LIQD Take 1 Can by mouth daily. Divided 1/2 can with breakfast and 1/2 can at lunch   levothyroxine (SYNTHROID, LEVOTHROID) 75 MCG tablet Take 75 mcg by mouth daily before breakfast. For thyroid therapy   liver oil-zinc oxide (DESITIN) 40 % ointment Apply 1 application topically as needed for irritation (to buttocks and inner thighs).    LORazepam (ATIVAN) 0.5 MG tablet Take 0.5 tablets (0.25 mg total) by mouth at bedtime.   losartan (COZAAR) 50 MG tablet Take 25 mg by mouth daily. In the morning   metoprolol tartrate (LOPRESSOR) 25 MG tablet Take 25 mg by mouth 2 (two) times daily.   omeprazole (PRILOSEC) 20 MG capsule Take 20 mg by mouth daily.   potassium chloride SA (K-DUR,KLOR-CON) 20 MEQ tablet Take 20 mEq by mouth daily.   Witch Hazel 14 % LIQD Apply topically as needed (for hemorrhoid flare up).   No facility-administered encounter medications on file as of 01/02/2019.     Activities of Daily Living In your present state of health, do you have any difficulty performing the following activities: 01/02/2019 07/14/2018  Hearing? Tempie Donning  Vision? Y Y  Difficulty concentrating or making decisions? Tempie Donning  Walking or climbing stairs? Y Y  Dressing or bathing? Y Y  Doing errands, shopping? Tempie Donning  Preparing Food and eating ? Y -  Using the Toilet? Y -  In the past six months, have you accidently leaked urine? Y -  Do you have problems with loss of bowel control? N -  Managing your Medications? Y -  Managing your Finances? Y -  Housekeeping or managing your Housekeeping? Y -  Some recent data might  be hidden    Patient Care Team: [name], DO as PCP - General (Geriatric Medicine) Community, [facility], Altamese Dilling, MD as Attending Physician (Gastroenterology) Garvin Fila, MD as Consulting Physician (Neurology) Julianne Handler, NP as Nurse Practitioner (Hospice and Palliative Medicine) Luberta Mutter, MD as Consulting Physician (Ophthalmology)    Assessment:   This is a routine wellness examination for [facility].  Exercise Activities and Dietary recommendations Current Exercise Habits:  Structured exercise class;The patient does not participate in regular exercise at present, Exercise limited by: neurologic condition(s)  Goals     DIET - EAT MORE FRUITS AND VEGETABLES     Increase water intake     Starting 06/27/2016 I will increase my water by a glass a day.        Fall Risk Fall Risk  01/02/2019 07/14/2018 04/08/2018 07/10/2017 06/27/2016  Falls in the past year? 0 0 Exclusion - non ambulatory No No  Number falls in past yr: 0 0 - - -  Injury with Fall? 0 0 - - -  Risk for fall due to : Impaired balance/gait Impaired balance/gait - - -  Follow up Falls evaluation completed - - - -   Is the patient's home free of loose throw rugs in walkways, pet beds, electrical cords, etc?   yes      Grab bars in the bathroom? yes      Handrails on the stairs?   yes      Adequate lighting?   yes  Timed Get Up and Go performed: not able to ambulate  Depression Screen PHQ 2/9 Scores 01/02/2019 07/10/2017 06/27/2016 11/07/2015  PHQ - 2 Score 0 0 0 2  PHQ- 9 Score - - - 3     Cognitive Function MMSE - Mini Mental State Exam 01/02/2019 03/13/2017 06/27/2016  Orientation to time 1 5 4   Orientation to Place 2 4 3   Registration 3 1 3   Attention/ Calculation 5 5 5   Recall 0 1 0  Language- name 2 objects 2 2 2   Language- repeat 1 1 1   Language- follow 3 step command 3 3 2   Language- read & follow direction 1 1 1   Write a sentence 1 1 1   Copy design 1 1 1   Total score  20 25 23         Immunization History  Administered Date(s) Administered   Influenza Inj Mdck Quad Pf 12/22/2015   Influenza Whole 12/17/2012   Influenza, High Dose Seasonal PF 12/18/2018   Influenza,inj,Quad PF,6+ Mos 12/24/2017   Influenza-Unspecified 12/08/2013, 12/16/2014, 12/27/2016   PPD Test 07/18/2012   Pneumococcal Conjugate-13 08/27/2015    Qualifies for Shingles Vaccine? Yes but has refused  Screening Tests Health Maintenance  Topic Date Due   TETANUS/TDAP  07/14/2019 (Originally 10/29/1938)   FOOT EXAM  07/14/2019   OPHTHALMOLOGY EXAM  09/22/2019   INFLUENZA VACCINE  Completed   PNA vac Low Risk Adult  Completed    Cancer Screenings: Lung: Low Dose CT Chest recommended if Age 35-80 years, 30 pack-year currently smoking OR have quit w/in 15years. Patient does not qualify. Breast:  Up to date on Mammogram? No   Up to date of Bone Density/Dexa? No aged out Colorectal: aged out  Additional Screenings: not indicated: Hepatitis C Screening:      Plan:      I have personally reviewed and noted the following in the patients chart:    Medical and social history  Use of alcohol, tobacco or illicit drugs   Current medications and supplements  Functional ability and status  Nutritional status  Physical activity  Advanced directives  List of other physicians  Hospitalizations, surgeries, and ER visits in previous 12 months  Vitals  Screenings to include cognitive, depression, and falls  Referrals and appointments  In addition, I have reviewed and discussed with patient certain preventive protocols, quality metrics, and best practice recommendations. A written personalized care plan for preventive services as  well as general preventive health recommendations were provided to patient.     Royal Hawthorn, NP  01/02/2019

## 2019-01-02 NOTE — Patient Instructions (Addendum)
Rachel Cooke , Thank you for taking time to come for your Medicare Wellness Visit. I appreciate your ongoing commitment to your health goals. Please review the following plan we discussed and let me know if I can assist you in the future.   Screening recommendations/referrals: Colonoscopy aged out Mammogram aged out Bone Density not ambulatory Recommended yearly ophthalmology/optometry visit for glaucoma screening and checkup Recommended yearly dental visit for hygiene and checkup  Vaccinations: Influenza vaccine up to date Pneumococcal vaccine up to date Tdap vaccine up to dat Shingles vaccine: pt refused Advanced directives: reviewed  Conditions/risks identified: cardiac risk  Next appointment: 1 year   Preventive Care 83 Years and Older, Female Preventive care refers to lifestyle choices and visits with your health care provider that can promote health and wellness. What does preventive care include?  A yearly physical exam. This is also called an annual well check.  Dental exams once or twice a year.  Routine eye exams. Ask your health care provider how often you should have your eyes checked.  Personal lifestyle choices, including:  Daily care of your teeth and gums.  Regular physical activity.  Eating a healthy diet.  Avoiding tobacco and drug use.  Limiting alcohol use.  Practicing safe sex.  Taking low-dose aspirin every day.  Taking vitamin and mineral supplements as recommended by your health care provider. What happens during an annual well check? The services and screenings done by your health care provider during your annual well check will depend on your age, overall health, lifestyle risk factors, and family history of disease. Counseling  Your health care provider may ask you questions about your:  Alcohol use.  Tobacco use.  Drug use.  Emotional well-being.  Home and relationship well-being.  Sexual activity.  Eating habits.  History  of falls.  Memory and ability to understand (cognition).  Work and work Statistician.  Reproductive health. Screening  You may have the following tests or measurements:  Height, weight, and BMI.  Blood pressure.  Lipid and cholesterol levels. These may be checked every 5 years, or more frequently if you are over 49 years old.  Skin check.  Lung cancer screening. You may have this screening every year starting at age 36 if you have a 30-pack-year history of smoking and currently smoke or have quit within the past 15 years.  Fecal occult blood test (FOBT) of the stool. You may have this test every year starting at age 12.  Flexible sigmoidoscopy or colonoscopy. You may have a sigmoidoscopy every 5 years or a colonoscopy every 10 years starting at age 80.  Hepatitis C blood test.  Hepatitis B blood test.  Sexually transmitted disease (STD) testing.  Diabetes screening. This is done by checking your blood sugar (glucose) after you have not eaten for a while (fasting). You may have this done every 1-3 years.  Bone density scan. This is done to screen for osteoporosis. You may have this done starting at age 4.  Mammogram. This may be done every 1-2 years. Talk to your health care provider about how often you should have regular mammograms. Talk with your health care provider about your test results, treatment options, and if necessary, the need for more tests. Vaccines  Your health care provider may recommend certain vaccines, such as:  Influenza vaccine. This is recommended every year.  Tetanus, diphtheria, and acellular pertussis (Tdap, Td) vaccine. You may need a Td booster every 10 years.  Zoster vaccine. You may need this after  age 37.  Pneumococcal 13-valent conjugate (PCV13) vaccine. One dose is recommended after age 68.  Pneumococcal polysaccharide (PPSV23) vaccine. One dose is recommended after age 71. Talk to your health care provider about which screenings and  vaccines you need and how often you need them. This information is not intended to replace advice given to you by your health care provider. Make sure you discuss any questions you have with your health care provider. Document Released: 03/18/2015 Document Revised: 11/09/2015 Document Reviewed: 12/21/2014 Elsevier Interactive Patient Education  2017 [place] Prevention in the Home Falls can cause injuries. They can happen to people of all ages. There are many things you can do to make your home safe and to help prevent falls. What can I do on the outside of my home?  Regularly fix the edges of walkways and driveways and fix any cracks.  Remove anything that might make you trip as you walk through a door, such as a raised step or threshold.  Trim any bushes or trees on the path to your home.  Use bright outdoor lighting.  Clear any walking paths of anything that might make someone trip, such as rocks or tools.  Regularly check to see if handrails are loose or broken. Make sure that both sides of any steps have handrails.  Any raised decks and porches should have guardrails on the edges.  Have any leaves, snow, or ice cleared regularly.  Use sand or salt on walking paths during winter.  Clean up any spills in your garage right away. This includes oil or grease spills. What can I do in the bathroom?  Use night lights.  Install grab bars by the toilet and in the tub and shower. Do not use towel bars as grab bars.  Use non-skid mats or decals in the tub or shower.  If you need to sit down in the shower, use a plastic, non-slip stool.  Keep the floor dry. Clean up any water that spills on the floor as soon as it happens.  Remove soap buildup in the tub or shower regularly.  Attach bath mats securely with double-sided non-slip rug tape.  Do not have throw rugs and other things on the floor that can make you trip. What can I do in the bedroom?  Use night lights.   Make sure that you have a light by your bed that is easy to reach.  Do not use any sheets or blankets that are too big for your bed. They should not hang down onto the floor.  Have a firm chair that has side arms. You can use this for support while you get dressed.  Do not have throw rugs and other things on the floor that can make you trip. What can I do in the kitchen?  Clean up any spills right away.  Avoid walking on wet floors.  Keep items that you use a lot in easy-to-reach places.  If you need to reach something above you, use a strong step stool that has a grab bar.  Keep electrical cords out of the way.  Do not use floor polish or wax that makes floors slippery. If you must use wax, use non-skid floor wax.  Do not have throw rugs and other things on the floor that can make you trip. What can I do with my stairs?  Do not leave any items on the stairs.  Make sure that there are handrails on both sides of the stairs  and use them. Fix handrails that are broken or loose. Make sure that handrails are as long as the stairways.  Check any carpeting to make sure that it is firmly attached to the stairs. Fix any carpet that is loose or worn.  Avoid having throw rugs at the top or bottom of the stairs. If you do have throw rugs, attach them to the floor with carpet tape.  Make sure that you have a light switch at the top of the stairs and the bottom of the stairs. If you do not have them, ask someone to add them for you. What else can I do to help prevent falls?  Wear shoes that:  Do not have high heels.  Have rubber bottoms.  Are comfortable and fit you well.  Are closed at the toe. Do not wear sandals.  If you use a stepladder:  Make sure that it is fully opened. Do not climb a closed stepladder.  Make sure that both sides of the stepladder are locked into place.  Ask someone to hold it for you, if possible.  Clearly mark and make sure that you can see:  Any  grab bars or handrails.  First and last steps.  Where the edge of each step is.  Use tools that help you move around (mobility aids) if they are needed. These include:  Canes.  Walkers.  Scooters.  Crutches.  Turn on the lights when you go into a dark area. Replace any light bulbs as soon as they burn out.  Set up your furniture so you have a clear path. Avoid moving your furniture around.  If any of your floors are uneven, fix them.  If there are any pets around you, be aware of where they are.  Review your medicines with your doctor. Some medicines can make you feel dizzy. This can increase your chance of falling. Ask your doctor what other things that you can do to help prevent falls. This information is not intended to replace advice given to you by your health care provider. Make sure you discuss any questions you have with your health care provider. Document Released: 12/16/2008 Document Revised: 07/28/2015 Document Reviewed: 03/26/2014 Elsevier Interactive Patient Education  2017 [employer].

## 2019-01-05 ENCOUNTER — Encounter: Payer: Self-pay | Admitting: Adult Health

## 2019-01-05 ENCOUNTER — Non-Acute Institutional Stay (SKILLED_NURSING_FACILITY): Payer: Medicare Other | Admitting: Adult Health

## 2019-01-05 DIAGNOSIS — E118 Type 2 diabetes mellitus with unspecified complications: Secondary | ICD-10-CM

## 2019-01-05 DIAGNOSIS — E039 Hypothyroidism, unspecified: Secondary | ICD-10-CM | POA: Diagnosis not present

## 2019-01-05 DIAGNOSIS — Z9189 Other specified personal risk factors, not elsewhere classified: Secondary | ICD-10-CM | POA: Diagnosis not present

## 2019-01-05 DIAGNOSIS — K224 Dyskinesia of esophagus: Secondary | ICD-10-CM | POA: Diagnosis not present

## 2019-01-05 DIAGNOSIS — S93401D Sprain of unspecified ligament of right ankle, subsequent encounter: Secondary | ICD-10-CM

## 2019-01-05 DIAGNOSIS — I69351 Hemiplegia and hemiparesis following cerebral infarction affecting right dominant side: Secondary | ICD-10-CM

## 2019-01-05 DIAGNOSIS — F0150 Vascular dementia without behavioral disturbance: Secondary | ICD-10-CM

## 2019-01-05 NOTE — Progress Notes (Signed)
Location:  Occupational psychologist of Service:  SNF (31) Provider:   Cindi Carbon, ANP [place] [phone]   [name], DO  Patient Care Team: [name], DO as PCP - General (Geriatric Medicine) [name], Altamese Dilling, MD as Attending Physician (Gastroenterology) Garvin Fila, MD as Consulting Physician (Neurology) Julianne Handler, NP as Nurse Practitioner (Hospice and Palliative Medicine) Luberta Mutter, MD as Consulting Physician (Ophthalmology)  Extended Emergency Contact Information Primary Emergency Contact: [place] Address: [address]          [place], [place] [address] [name] of [place] Phone: [phone] Mobile Phone: [phone] Relation: Daughter  Code Status:  DNR Goals of care: Advanced Directive information Advanced Directives 01/02/2019  Does Patient Have a Medical Advance Directive? Yes  Type of Paramedic of [place];Out of facility DNR (pink MOST or yellow form);Living will  Does patient want to make changes to medical advance directive? No - Patient declined  Copy of [place] in Chart? Yes - validated most recent copy scanned in chart (See row information)  Pre-existing out of facility DNR order (yellow form or pink MOST form) -     Chief Complaint  Patient presents with  . Medical Management of Chronic Issues    HPI:  Pt is a 85 y.o. female seen today for medical management of chronic diseases.   She was seen for right ankle pain and diagnosed with a sprain and a walking boot ordered on 10/29. This has not been applied yet. She reports that the pain is mild and tylenol helps. She uses a lift for transfers and tries not to place weight on the right ankle. Only mild pain and swelling persist.  DM II: CBGs fating have been in the 150's. Last A1C 7.7. Not currently on meds.   Vascular dementia: noted hx of CVA  with right sided weakness.  MMSE scores 20/30 10/16/18, slow declined noted.   Esophageal dysmotility: currently tolerating a puree diet. Has some items that are solid per waiver form. No issues with pain, cough, weight loss, etc.   Lab Results  Component Value Date   TSH 1.32 10/07/2018  Well controlled on synthroid.   Past Medical History:  Diagnosis Date  . Abnormality of gait 2007  . Acquired cyst of kidney 2002  . Acute sinus infection 11/25/2012  . Allergic rhinitis due to pollen   . Altered mental status 11/28/2012  . Anxiety state, unspecified   . Atrioventricular block, complete ([place]) 2003   s/p PPM 2003, generator chnage 2011  . Cardiac pacemaker in situ 2003  . Coronary atherosclerosis of native coronary artery 2003   non obstructive by Cath 2003  . CVA (cerebral infarction) 06/28/2012   rt hemiparesis, dysphagia  . Dendritic keratitis 2012  . Dysuria   . Edema 2003  . Esophageal dysmotility 08/19/2012  . Food impaction of esophagus 08/15/2012  . HTN (hypertension) 06/28/2012  . Hypertension   . Macular degeneration (senile) of retina, unspecified 2012  . Malignant neoplasm of breast (female), unspecified site 1970   S/P Rt radical mastectomy  . NSTEMI (non-ST elevated myocardial infarction) ([place]) 06/28/2012  . Osteoarthrosis, unspecified whether generalized or localized, unspecified site 2013  . Pacemaker   . Personal history of colonic polyps     s/p colonoscopy/polypectomy 2003  . Pure hyperglyceridemia 2004  . Senile osteoporosis 2003  . Stroke ([place]) 06/28/2012  . Tinnitus 2007  .  Type II or unspecified type diabetes mellitus without mention of complication, not stated as uncontrolled 2003  . Unspecified arthropathy, pelvic region and thigh 2010  . Unspecified glaucoma(365.9) 2013  . Unspecified hypothyroidism 2009  . Unspecified vitamin D deficiency 2009  . Urinary retention 07/07/2012   Past Surgical History:  Procedure Laterality Date  . BOTOX INJECTION N/A  10/03/2012   Procedure: BOTOX INJECTION;  Surgeon: [name], MD;  Location: [facility] ENDOSCOPY;  Service: Endoscopy;  Laterality: N/A;  . ESOPHAGOGASTRODUODENOSCOPY N/A 08/15/2012   Procedure: ESOPHAGOGASTRODUODENOSCOPY (EGD);  Surgeon: [name], MD;  Location[facility] ENDOSCOPY;  Service: Endoscopy;  Laterality: N/A;  . ESOPHAGOGASTRODUODENOSCOPY N/A 08/15/2012   Procedure: ESOPHAGOGASTRODUODENOSCOPY (EGD);  Surgeon: [name], MD;  Location: [facility];  Service: Endoscopy;  Laterality: N/A;  . ESOPHAGOGASTRODUODENOSCOPY N/A 10/03/2012   Procedure: ESOPHAGOGASTRODUODENOSCOPY (EGD);  Surgeon: [name], MD;  Location: [facility];  Service: Endoscopy;  Laterality: N/A;  . ESOPHAGOGASTRODUODENOSCOPY N/A 12/22/2012   Procedure: ESOPHAGOGASTRODUODENOSCOPY (EGD);  Surgeon: [name], MD;  Location: [facility] ENDOSCOPY;  Service: Endoscopy;  Laterality: N/A;  . ESOPHAGOSCOPY N/A 08/15/2012   Procedure: ESOPHAGOSCOPY;  Surgeon: [name], MD;  Location: [facility];  Service: ENT;  Laterality: N/A;  . FOREIGN BODY REMOVAL ESOPHAGEAL N/A 08/15/2012   Procedure: REMOVAL FOREIGN BODY ESOPHAGEAL;  Surgeon: [name], MD;  Location: [facility];  Service: ENT;  Laterality: N/A;  . INSERT / REPLACE / REMOVE PACEMAKER    . MASTECTOMY Right 1970   Cancer  . SAVORY DILATION N/A 10/03/2012   Procedure: SAVORY DILATION;  Surgeon: [name], MD;  Location: [facility] ENDOSCOPY;  Service: Endoscopy;  Laterality: N/A;    Allergies  Allergen Reactions  . Augmentin [Amoxicillin-Pot Clavulanate] Nausea And Vomiting  . Lactose Intolerance (Gi)     Outpatient Encounter Medications as of 01/05/2019  Medication Sig  . acetaminophen (TYLENOL) 650 MG CR tablet Take 650 mg by mouth 2 (two) times daily. For pain and every 4 hours as needed for up to 72 hours, document area of discomfort and effectiveness or medication  . carboxymethylcellulose (REFRESH PLUS) 0.5 % SOLN 2 drops 2 (two) times daily.   . Dentifrices (BIOTENE DRY MOUTH CARE DT)  Take 1 spray by mouth 3 (three) times daily as needed.   Marland Kitchen escitalopram (LEXAPRO) 20 MG tablet Take 20 mg by mouth daily. For depression/ anxiety.  . furosemide (LASIX) 40 MG tablet Take 40 mg by mouth.  Marland Kitchen GLUCERNA (GLUCERNA) LIQD Take 1 Can by mouth daily. Divided 1/2 can with breakfast and 1/2 can at lunch  . levothyroxine (SYNTHROID, LEVOTHROID) 75 MCG tablet Take 75 mcg by mouth daily before breakfast. For thyroid therapy  . liver oil-zinc oxide (DESITIN) 40 % ointment Apply 1 application topically as needed for irritation (to buttocks and inner thighs).   . LORazepam (ATIVAN) 0.5 MG tablet Take 0.5 tablets (0.25 mg total) by mouth at bedtime.  Marland Kitchen losartan (COZAAR) 50 MG tablet Take 25 mg by mouth daily. In the morning  . metoprolol tartrate (LOPRESSOR) 25 MG tablet Take 25 mg by mouth 2 (two) times daily.  Marland Kitchen omeprazole (PRILOSEC) 20 MG capsule Take 20 mg by mouth daily.  . potassium chloride SA (K-DUR,KLOR-CON) 20 MEQ tablet Take 20 mEq by mouth daily.  Addison Lank Haz[place] 14 % LIQD Apply topically as needed (for hemorrhoid flare up).   No facility-administered encounter medications on file as of 01/05/2019.     Review of Systems  Constitutional: Negative  for activity change, appetite change, chills, diaphoresis, fatigue, fever and unexpected weight change.  HENT: Negative for congestion.   Respiratory: Negative for cough, shortness of breath and wheezing.   Cardiovascular: Positive for leg swelling. Negative for chest pain and palpitations.  Gastrointestinal: Negative for abdominal distention, abdominal pain, constipation and diarrhea.  Genitourinary: Negative for difficulty urinating and dysuria.  Musculoskeletal: Positive for gait problem. Negative for arthralgias, back pain, joint swelling and myalgias.       Right ankle pain and swelling  Neurological: Positive for numbness (right arm and leg chronic). Negative for dizziness, tremors, seizures, syncope, facial asymmetry, speech difficulty,  weakness, light-headedness and headaches.  Psychiatric/Behavioral: Positive for confusion. Negative for agitation and behavioral problems.    Immunization History  Administered Date(s) Administered  . Influenza Inj Mdck Quad Pf 12/22/2015  . Influenza Whole 12/17/2012  . Influenza, High Dose Seasonal PF 12/18/2018  . Influenza,inj,Quad PF,6+ Mos 12/24/2017  . Influenza-Unspecified 12/08/2013, 12/16/2014, 12/27/2016  . PPD Test 07/18/2012  . Pneumococcal Conjugate-13 08/27/2015   Pertinent  Health Maintenance Due  Topic Date Due  . FOOT EXAM  07/14/2019  . OPHTHALMOLOGY EXAM  09/22/2019  . INFLUENZA VACCINE  Completed  . PNA vac Low Risk Adult  Completed   Fall Risk  01/02/2019 07/14/2018 04/08/2018 07/10/2017 06/27/2016  Falls in the past year? 0 0 Exclusion - non ambulatory No No  Number falls in past yr: 0 0 - - -  Injury with Fall? 0 0 - - -  Risk for fall due to : Impaired balance/gait Impaired balance/gait - - -  Follow up Falls evaluation completed - - - -   Functional Status Survey:    Vitals:   01/05/19 1635  Weight: 170 lb 3.2 oz (77.2 kg)   Body mass index is 32.16 kg/m. Physical Exam Vitals signs and nursing note reviewed.  Constitutional:      General: She is not in acute distress.    Appearance: She is not diaphoretic.  HENT:     Head: Normocephalic and atraumatic.  Neck:     Vascular: No JVD.  Cardiovascular:     Rate and Rhythm: Normal rate and regular rhythm.     Heart sounds: No murmur.  Pulmonary:     Effort: Pulmonary effort is normal. No respiratory distress.     Breath sounds: Normal breath sounds. No wheezing.  Abdominal:     General: Bowel sounds are normal. There is no distension.     Palpations: Abdomen is soft.  Musculoskeletal:     Right ankle: She exhibits swelling. She exhibits no ecchymosis, no deformity, no laceration and normal pulse. Tenderness. Lateral malleolus and medial malleolus tenderness found. Achilles tendon normal.      Right lower leg: Edema (R>L) present.     Left lower leg: Edema present.  Skin:    General: Skin is warm and dry.  Neurological:     Mental Status: She is alert and oriented to person, place, and time. Mental status is at baseline.     Comments: Right sided hemiparesis     Labs reviewed: Recent Labs    03/18/18 0600  NA 144  K 4.4  BUN 15  CREATININE 0.5   No results for input(s): AST, ALT, ALKPHOS, BILITOT, PROT, ALBUMIN in the last 8760 hours. No results for input(s): WBC, NEUTROABS, HGB, HCT, MCV, PLT in the last 8760 hours. Lab Results  Component Value Date   TSH 1.32 10/07/2018   Lab Results  Component Value Date  HGBA1C 7.7 10/07/2018   Lab Results  Component Value Date   CHOL 135 09/19/2015   HDL 50 09/19/2015   LDLCALC 55 09/19/2015   TRIG 149 09/19/2015   CHOLHDL 4.1 07/01/2012    Significant Diagnostic Results in last 30 days:  No results found.  Assessment/Plan 1. Sprain of right ankle, unspecified ligament, subsequent encounter Continue with some pain on weight bearing. Recommended boot but this has not been applied yet. Recommend rest, and tylenol for pain. Can use ace wrap until boot comes in. Continues to work with OT  2. Esophageal dysmotility No issues with current diet  Continue asp prec and puree diet.   3. Controlled type 2 diabetes mellitus with complication, without long-term current use of insulin (HCC) Lab Results  Component Value Date   HGBA1C 7.7 10/07/2018  Goal <8.  CBGS are stable, discontinued weekly checks.    4. Acquired hypothyroidism Lab Results  Component Value Date   TSH 1.32 10/07/2018  Continue Synthroid 75 mcg qd   5. Hemiparesis affecting right side as late effect of cerebrovascular accident ([place]) BP controlled. Not on antiplatelet due to prior hx of bleeding. Goals of care are comfort based.   6. Vascular dementia without behavioral disturbance (HCC) Progressive decline in cognition and physical function c/w  the disease. Continue supportive care in the skilled environment.     Family/ staff Communication: resident  Labs/tests ordered:  NA

## 2019-01-06 DIAGNOSIS — M25571 Pain in right ankle and joints of right foot: Secondary | ICD-10-CM | POA: Diagnosis not present

## 2019-01-06 DIAGNOSIS — R600 Localized edema: Secondary | ICD-10-CM | POA: Diagnosis not present

## 2019-01-06 DIAGNOSIS — M13871 Other specified arthritis, right ankle and foot: Secondary | ICD-10-CM | POA: Diagnosis not present

## 2019-01-06 DIAGNOSIS — I872 Venous insufficiency (chronic) (peripheral): Secondary | ICD-10-CM | POA: Diagnosis not present

## 2019-01-06 DIAGNOSIS — I69151 Hemiplegia and hemiparesis following nontraumatic intracerebral hemorrhage affecting right dominant side: Secondary | ICD-10-CM | POA: Diagnosis not present

## 2019-01-07 DIAGNOSIS — R600 Localized edema: Secondary | ICD-10-CM | POA: Diagnosis not present

## 2019-01-07 DIAGNOSIS — I69151 Hemiplegia and hemiparesis following nontraumatic intracerebral hemorrhage affecting right dominant side: Secondary | ICD-10-CM | POA: Diagnosis not present

## 2019-01-07 DIAGNOSIS — I872 Venous insufficiency (chronic) (peripheral): Secondary | ICD-10-CM | POA: Diagnosis not present

## 2019-01-07 DIAGNOSIS — M13871 Other specified arthritis, right ankle and foot: Secondary | ICD-10-CM | POA: Diagnosis not present

## 2019-01-07 DIAGNOSIS — M25571 Pain in right ankle and joints of right foot: Secondary | ICD-10-CM | POA: Diagnosis not present

## 2019-01-13 DIAGNOSIS — I69151 Hemiplegia and hemiparesis following nontraumatic intracerebral hemorrhage affecting right dominant side: Secondary | ICD-10-CM | POA: Diagnosis not present

## 2019-01-13 DIAGNOSIS — R600 Localized edema: Secondary | ICD-10-CM | POA: Diagnosis not present

## 2019-01-13 DIAGNOSIS — I872 Venous insufficiency (chronic) (peripheral): Secondary | ICD-10-CM | POA: Diagnosis not present

## 2019-01-13 DIAGNOSIS — M13871 Other specified arthritis, right ankle and foot: Secondary | ICD-10-CM | POA: Diagnosis not present

## 2019-01-13 DIAGNOSIS — M25571 Pain in right ankle and joints of right foot: Secondary | ICD-10-CM | POA: Diagnosis not present

## 2019-01-14 DIAGNOSIS — I872 Venous insufficiency (chronic) (peripheral): Secondary | ICD-10-CM | POA: Diagnosis not present

## 2019-01-14 DIAGNOSIS — Z9189 Other specified personal risk factors, not elsewhere classified: Secondary | ICD-10-CM | POA: Diagnosis not present

## 2019-01-14 DIAGNOSIS — M13871 Other specified arthritis, right ankle and foot: Secondary | ICD-10-CM | POA: Diagnosis not present

## 2019-01-14 DIAGNOSIS — R600 Localized edema: Secondary | ICD-10-CM | POA: Diagnosis not present

## 2019-01-14 DIAGNOSIS — M25571 Pain in right ankle and joints of right foot: Secondary | ICD-10-CM | POA: Diagnosis not present

## 2019-01-14 DIAGNOSIS — I69151 Hemiplegia and hemiparesis following nontraumatic intracerebral hemorrhage affecting right dominant side: Secondary | ICD-10-CM | POA: Diagnosis not present

## 2019-01-15 DIAGNOSIS — I872 Venous insufficiency (chronic) (peripheral): Secondary | ICD-10-CM | POA: Diagnosis not present

## 2019-01-15 DIAGNOSIS — I69151 Hemiplegia and hemiparesis following nontraumatic intracerebral hemorrhage affecting right dominant side: Secondary | ICD-10-CM | POA: Diagnosis not present

## 2019-01-15 DIAGNOSIS — M13871 Other specified arthritis, right ankle and foot: Secondary | ICD-10-CM | POA: Diagnosis not present

## 2019-01-15 DIAGNOSIS — M25571 Pain in right ankle and joints of right foot: Secondary | ICD-10-CM | POA: Diagnosis not present

## 2019-01-15 DIAGNOSIS — R600 Localized edema: Secondary | ICD-10-CM | POA: Diagnosis not present

## 2019-01-16 DIAGNOSIS — R600 Localized edema: Secondary | ICD-10-CM | POA: Diagnosis not present

## 2019-01-16 DIAGNOSIS — I872 Venous insufficiency (chronic) (peripheral): Secondary | ICD-10-CM | POA: Diagnosis not present

## 2019-01-16 DIAGNOSIS — I69151 Hemiplegia and hemiparesis following nontraumatic intracerebral hemorrhage affecting right dominant side: Secondary | ICD-10-CM | POA: Diagnosis not present

## 2019-01-16 DIAGNOSIS — M25571 Pain in right ankle and joints of right foot: Secondary | ICD-10-CM | POA: Diagnosis not present

## 2019-01-16 DIAGNOSIS — M13871 Other specified arthritis, right ankle and foot: Secondary | ICD-10-CM | POA: Diagnosis not present

## 2019-01-19 DIAGNOSIS — R600 Localized edema: Secondary | ICD-10-CM | POA: Diagnosis not present

## 2019-01-19 DIAGNOSIS — M25571 Pain in right ankle and joints of right foot: Secondary | ICD-10-CM | POA: Diagnosis not present

## 2019-01-19 DIAGNOSIS — I69151 Hemiplegia and hemiparesis following nontraumatic intracerebral hemorrhage affecting right dominant side: Secondary | ICD-10-CM | POA: Diagnosis not present

## 2019-01-19 DIAGNOSIS — I872 Venous insufficiency (chronic) (peripheral): Secondary | ICD-10-CM | POA: Diagnosis not present

## 2019-01-19 DIAGNOSIS — M13871 Other specified arthritis, right ankle and foot: Secondary | ICD-10-CM | POA: Diagnosis not present

## 2019-01-20 DIAGNOSIS — I872 Venous insufficiency (chronic) (peripheral): Secondary | ICD-10-CM | POA: Diagnosis not present

## 2019-01-20 DIAGNOSIS — Z9189 Other specified personal risk factors, not elsewhere classified: Secondary | ICD-10-CM | POA: Diagnosis not present

## 2019-01-20 DIAGNOSIS — M13871 Other specified arthritis, right ankle and foot: Secondary | ICD-10-CM | POA: Diagnosis not present

## 2019-01-20 DIAGNOSIS — I69151 Hemiplegia and hemiparesis following nontraumatic intracerebral hemorrhage affecting right dominant side: Secondary | ICD-10-CM | POA: Diagnosis not present

## 2019-01-20 DIAGNOSIS — M25571 Pain in right ankle and joints of right foot: Secondary | ICD-10-CM | POA: Diagnosis not present

## 2019-01-20 DIAGNOSIS — R600 Localized edema: Secondary | ICD-10-CM | POA: Diagnosis not present

## 2019-01-23 DIAGNOSIS — I872 Venous insufficiency (chronic) (peripheral): Secondary | ICD-10-CM | POA: Diagnosis not present

## 2019-01-23 DIAGNOSIS — M13871 Other specified arthritis, right ankle and foot: Secondary | ICD-10-CM | POA: Diagnosis not present

## 2019-01-23 DIAGNOSIS — I69151 Hemiplegia and hemiparesis following nontraumatic intracerebral hemorrhage affecting right dominant side: Secondary | ICD-10-CM | POA: Diagnosis not present

## 2019-01-23 DIAGNOSIS — R600 Localized edema: Secondary | ICD-10-CM | POA: Diagnosis not present

## 2019-01-23 DIAGNOSIS — M25571 Pain in right ankle and joints of right foot: Secondary | ICD-10-CM | POA: Diagnosis not present

## 2019-01-27 DIAGNOSIS — I872 Venous insufficiency (chronic) (peripheral): Secondary | ICD-10-CM | POA: Diagnosis not present

## 2019-01-27 DIAGNOSIS — I69151 Hemiplegia and hemiparesis following nontraumatic intracerebral hemorrhage affecting right dominant side: Secondary | ICD-10-CM | POA: Diagnosis not present

## 2019-01-27 DIAGNOSIS — M13871 Other specified arthritis, right ankle and foot: Secondary | ICD-10-CM | POA: Diagnosis not present

## 2019-01-27 DIAGNOSIS — M25571 Pain in right ankle and joints of right foot: Secondary | ICD-10-CM | POA: Diagnosis not present

## 2019-01-27 DIAGNOSIS — Z9189 Other specified personal risk factors, not elsewhere classified: Secondary | ICD-10-CM | POA: Diagnosis not present

## 2019-01-27 DIAGNOSIS — R600 Localized edema: Secondary | ICD-10-CM | POA: Diagnosis not present

## 2019-01-28 DIAGNOSIS — M25571 Pain in right ankle and joints of right foot: Secondary | ICD-10-CM | POA: Diagnosis not present

## 2019-01-28 DIAGNOSIS — M13871 Other specified arthritis, right ankle and foot: Secondary | ICD-10-CM | POA: Diagnosis not present

## 2019-01-28 DIAGNOSIS — I69151 Hemiplegia and hemiparesis following nontraumatic intracerebral hemorrhage affecting right dominant side: Secondary | ICD-10-CM | POA: Diagnosis not present

## 2019-01-28 DIAGNOSIS — R600 Localized edema: Secondary | ICD-10-CM | POA: Diagnosis not present

## 2019-01-28 DIAGNOSIS — I872 Venous insufficiency (chronic) (peripheral): Secondary | ICD-10-CM | POA: Diagnosis not present

## 2019-02-02 ENCOUNTER — Non-Acute Institutional Stay (SKILLED_NURSING_FACILITY): Payer: Medicare Other | Admitting: Adult Health

## 2019-02-02 DIAGNOSIS — I10 Essential (primary) hypertension: Secondary | ICD-10-CM | POA: Diagnosis not present

## 2019-02-02 DIAGNOSIS — I5032 Chronic diastolic (congestive) heart failure: Secondary | ICD-10-CM | POA: Diagnosis not present

## 2019-02-02 DIAGNOSIS — I442 Atrioventricular block, complete: Secondary | ICD-10-CM

## 2019-02-02 DIAGNOSIS — S93401D Sprain of unspecified ligament of right ankle, subsequent encounter: Secondary | ICD-10-CM

## 2019-02-02 DIAGNOSIS — F0150 Vascular dementia without behavioral disturbance: Secondary | ICD-10-CM

## 2019-02-02 DIAGNOSIS — F339 Major depressive disorder, recurrent, unspecified: Secondary | ICD-10-CM

## 2019-02-03 DIAGNOSIS — I872 Venous insufficiency (chronic) (peripheral): Secondary | ICD-10-CM | POA: Diagnosis not present

## 2019-02-03 DIAGNOSIS — M25571 Pain in right ankle and joints of right foot: Secondary | ICD-10-CM | POA: Diagnosis not present

## 2019-02-03 DIAGNOSIS — I69151 Hemiplegia and hemiparesis following nontraumatic intracerebral hemorrhage affecting right dominant side: Secondary | ICD-10-CM | POA: Diagnosis not present

## 2019-02-03 DIAGNOSIS — R600 Localized edema: Secondary | ICD-10-CM | POA: Diagnosis not present

## 2019-02-03 DIAGNOSIS — M13871 Other specified arthritis, right ankle and foot: Secondary | ICD-10-CM | POA: Diagnosis not present

## 2019-02-04 DIAGNOSIS — M13871 Other specified arthritis, right ankle and foot: Secondary | ICD-10-CM | POA: Diagnosis not present

## 2019-02-04 DIAGNOSIS — I69151 Hemiplegia and hemiparesis following nontraumatic intracerebral hemorrhage affecting right dominant side: Secondary | ICD-10-CM | POA: Diagnosis not present

## 2019-02-04 DIAGNOSIS — I872 Venous insufficiency (chronic) (peripheral): Secondary | ICD-10-CM | POA: Diagnosis not present

## 2019-02-04 DIAGNOSIS — M25571 Pain in right ankle and joints of right foot: Secondary | ICD-10-CM | POA: Diagnosis not present

## 2019-02-04 DIAGNOSIS — R600 Localized edema: Secondary | ICD-10-CM | POA: Diagnosis not present

## 2019-02-05 ENCOUNTER — Encounter: Payer: Self-pay | Admitting: Adult Health

## 2019-02-05 NOTE — Progress Notes (Signed)
Location:  Occupational psychologist of Service:  SNF (31) Provider:   Cindi Carbon, ANP [place] [phone]  [name], DO  Patient Care Team: [name], DO as PCP - General (Geriatric Medicine) Community, Well [name], Altamese Dilling, MD as Attending Physician (Gastroenterology) Garvin Fila, MD as Consulting Physician (Neurology) Julianne Handler, NP as Nurse Practitioner (Hospice and Palliative Medicine) Luberta Mutter, MD as Consulting Physician (Ophthalmology)  Extended Emergency Contact Information Primary Emergency Contact: [place] Address: [address]          [place], [place] [address] [name] of [name] Phone: [phone] Mobile Phone: [phone] Relation: Daughter  Code Status:  DNR Goals of care: Advanced Directive information Advanced Directives 01/02/2019  Does Patient Have a Medical Advance Directive? Yes  Type of Paramedic of [place];Out of facility DNR (pink MOST or yellow form);Living will  Does patient want to make changes to medical advance directive? No - Patient declined  Copy of [place] in Chart? Yes - validated most recent copy scanned in chart (See row information)  Pre-existing out of facility DNR order (yellow form or pink MOST form) -     Chief Complaint  Patient presents with  . Medical Management of Chronic Issues    HPI:  Pt is a 85 y.o. female seen today for medical management of chronic diseases.    Right ankle sprain: improvement in swelling and pain. Continues to work with OT, bearing weight as tolerated in stand up lift with a walking boot  She has a hx of CVA with right sided weakness. Continue to operate her motorized scooter. Hx of vascular dementia with progressive short term memory loss, no behaviors issues.  Weight controlled with lasix daily due to hx CHF. No sob, doe, or increased edema  BP  controlled  Mood: reports periods of anxiety and depression associated with social isolation. Sleeps well with good appetite.   Past Medical History:  Diagnosis Date  . Abnormality of gait 2007  . Acquired cyst of kidney 2002  . Acute sinus infection 11/25/2012  . Allergic rhinitis due to pollen   . Altered mental status 11/28/2012  . Anxiety state, unspecified   . Atrioventricular block, complete ([place]) 2003   s/p PPM 2003, generator chnage 2011  . Cardiac pacemaker in situ 2003  . Coronary atherosclerosis of native coronary artery 2003   non obstructive by Cath 2003  . CVA (cerebral infarction) 06/28/2012   rt hemiparesis, dysphagia  . Dendritic keratitis 2012  . Dysuria   . Edema 2003  . Esophageal dysmotility 08/19/2012  . Food impaction of esophagus 08/15/2012  . HTN (hypertension) 06/28/2012  . Hypertension   . Macular degeneration (senile) of retina, unspecified 2012  . Malignant neoplasm of breast (female), unspecified site 1970   S/P Rt radical mastectomy  . NSTEMI (non-ST elevated myocardial infarction) ([place]) 06/28/2012  . Osteoarthrosis, unspecified whether generalized or localized, unspecified site 2013  . Pacemaker   . Personal history of colonic polyps     s/p colonoscopy/polypectomy 2003  . Pure hyperglyceridemia 2004  . Senile osteoporosis 2003  . Stroke ([place]) 06/28/2012  . Tinnitus 2007  . Type II or unspecified type diabetes mellitus without mention of complication, not stated as uncontrolled 2003  . Unspecified arthropathy, pelvic region and thigh 2010  . Unspecified glaucoma(365.9) 2013  . Unspecified hypothyroidism 2009  . Unspecified vitamin D deficiency 2009  . Urinary retention 07/07/2012  Past Surgical History:  Procedure Laterality Date  . BOTOX INJECTION N/A 10/03/2012   Procedure: BOTOX INJECTION;  Surgeon: [name], MD;  Location: [facility];  Service: Endoscopy;  Laterality: N/A;  . ESOPHAGOGASTRODUODENOSCOPY N/A 08/15/2012   Procedure:  ESOPHAGOGASTRODUODENOSCOPY (EGD);  Surgeon: [name], MD;  Location: [facility] ENDOSCOPY;  Service: Endoscopy;  Laterality: N/A;  . ESOPHAGOGASTRODUODENOSCOPY N/A 08/15/2012   Procedure: ESOPHAGOGASTRODUODENOSCOPY (EGD);  Surgeon: [name], MD;  Location: [place];  Service: Endoscopy;  Laterality: N/A;  . ESOPHAGOGASTRODUODENOSCOPY N/A 10/03/2012   Procedure: ESOPHAGOGASTRODUODENOSCOPY (EGD);  Surgeon: [name], MD;  Location: [facility];  Service: Endoscopy;  Laterality: N/A;  . ESOPHAGOGASTRODUODENOSCOPY N/A 12/22/2012   Procedure: ESOPHAGOGASTRODUODENOSCOPY (EGD);  Surgeon: [name], MD;  Location: [facility] ENDOSCOPY;  Service: Endoscopy;  Laterality: N/A;  . ESOPHAGOSCOPY N/A 08/15/2012   Procedure: ESOPHAGOSCOPY;  Surgeon: [name], MD;  Location: [facility];  Service: ENT;  Laterality: N/A;  . FOREIGN BODY REMOVAL ESOPHAGEAL N/A 08/15/2012   Procedure: REMOVAL FOREIGN BODY ESOPHAGEAL;  Surgeon: [name], MD;  Location: [place];  Service: ENT;  Laterality: N/A;  . INSERT / REPLACE / REMOVE PACEMAKER    . MASTECTOMY Right 1970   Cancer  . SAVORY DILATION N/A 10/03/2012   Procedure: SAVORY DILATION;  Surgeon: [name], MD;  [facility];  Service: Endoscopy;  Laterality: N/A;    Allergies  Allergen Reactions  . Augmentin [Amoxicillin-Pot Clavulanate] Nausea And Vomiting  . Lactose Intolerance (Gi)   . Shingrix [Zoster Vac Recomb Adjuvanted] Rash    Outpatient Encounter Medications as of 02/02/2019  Medication Sig  . acetaminophen (TYLENOL) 650 MG CR tablet Take 650 mg by mouth 2 (two) times daily. For pain and every 4 hours as needed for up to 72 hours, document area of discomfort and effectiveness or medication  . carboxymethylcellulose (REFRESH PLUS) 0.5 % SOLN 2 drops 2 (two) times daily.   . Dentifrices (BIOTENE DRY MOUTH CARE DT) Take 1 spray by mouth 3 (three) times daily as needed.   Marland Kitchen escitalopram (LEXAPRO) 20 MG tablet Take 20 mg by mouth daily. For depression/  anxiety.  . furosemide (LASIX) 40 MG tablet Take 40 mg by mouth.  Marland Kitchen GLUCERNA (GLUCERNA) LIQD Take 1 Can by mouth daily. Divided 1/2 can with breakfast and 1/2 can at lunch  . levothyroxine (SYNTHROID, LEVOTHROID) 75 MCG tablet Take 75 mcg by mouth daily before breakfast. For thyroid therapy  . liver oil-zinc oxide (DESITIN) 40 % ointment Apply 1 application topically as needed for irritation (to buttocks and inner thighs).   . LORazepam (ATIVAN) 0.5 MG tablet Take 0.5 tablets (0.25 mg total) by mouth at bedtime.  Marland Kitchen losartan (COZAAR) 50 MG tablet Take 25 mg by mouth daily. In the morning  . metoprolol tartrate (LOPRESSOR) 25 MG tablet Take 25 mg by mouth 2 (two) times daily.  Marland Kitchen omeprazole (PRILOSEC) 20 MG capsule Take 20 mg by mouth daily.  . potassium chloride SA (K-DUR,KLOR-CON) 20 MEQ tablet Take 20 mEq by mouth daily.  Addison Lank Hazel 14 % LIQD Apply topically as needed (for hemorrhoid flare up).   No facility-administered encounter medications on file as of 02/02/2019.     Review of Systems  Constitutional: Negative for activity change, appetite change, chills, diaphoresis, fatigue, fever and unexpected weight change.  HENT: Negative for congestion.   Respiratory: Negative for cough, shortness of breath and wheezing.   Cardiovascular: Positive for leg swelling. Negative for chest pain and  palpitations.  Gastrointestinal: Negative for abdominal distention, abdominal pain, constipation and diarrhea.  Genitourinary: Negative for difficulty urinating and dysuria.  Musculoskeletal: Positive for arthralgias, gait problem and joint swelling. Negative for back pain and myalgias.  Neurological: Positive for weakness. Negative for dizziness, tremors, seizures, syncope, facial asymmetry, speech difficulty, light-headedness, numbness and headaches.  Psychiatric/Behavioral: Positive for confusion. Negative for agitation and behavioral problems.    Immunization History  Administered Date(s)  Administered  . Influenza Inj Mdck Quad Pf 12/22/2015  . Influenza Whole 12/17/2012  . Influenza, High Dose Seasonal PF 12/18/2018  . Influenza,inj,Quad PF,6+ Mos 12/24/2017  . Influenza-Unspecified 12/08/2013, 12/16/2014, 12/27/2016  . PPD Test 07/18/2012  . Pneumococcal Conjugate-13 08/27/2015   Pertinent  Health Maintenance Due  Topic Date Due  . FOOT EXAM  07/14/2019  . OPHTHALMOLOGY EXAM  09/22/2019  . INFLUENZA VACCINE  Completed  . PNA vac Low Risk Adult  Completed   Fall Risk  01/02/2019 07/14/2018 04/08/2018 07/10/2017 06/27/2016  Falls in the past year? 0 0 Exclusion - non ambulatory No No  Number falls in past yr: 0 0 - - -  Injury with Fall? 0 0 - - -  Risk for fall due to : Impaired balance/gait Impaired balance/gait - - -  Follow up Falls evaluation completed - - - -   Functional Status Survey:    Vitals:   02/05/19 1338  Weight: 170 lb 3.2 oz (77.2 kg)   Body mass index is 32.16 kg/m. Physical Exam Vitals signs and nursing note reviewed.  Constitutional:      General: She is not in acute distress.    Appearance: She is not diaphoretic.  HENT:     Head: Normocephalic and atraumatic.  Neck:     Vascular: No JVD.  Cardiovascular:     Rate and Rhythm: Normal rate and regular rhythm.     Heart sounds: No murmur.  Pulmonary:     Effort: Pulmonary effort is normal. No respiratory distress.     Breath sounds: Rales present. No wheezing.  Abdominal:     General: Bowel sounds are normal. There is no distension.     Palpations: Abdomen is soft.  Musculoskeletal:        General: Swelling (right lateral malleolus. no redness or deformity or warmth. MIld TTP. Joint laxiety noted to ATF) present.     Right lower leg: Edema present.     Left lower leg: Edema present.  Skin:    General: Skin is warm and dry.  Neurological:     Mental Status: She is alert and oriented to person, place, and time.  Psychiatric:        Mood and Affect: Mood normal.     Labs  reviewed: Recent Labs    03/18/18 0600  NA 144  K 4.4  BUN 15  CREATININE 0.5   No results for input(s): AST, ALT, ALKPHOS, BILITOT, PROT, ALBUMIN in the last 8760 hours. No results for input(s): WBC, NEUTROABS, HGB, HCT, MCV, PLT in the last 8760 hours. Lab Results  Component Value Date   TSH 1.32 10/07/2018   Lab Results  Component Value Date   HGBA1C 7.7 10/07/2018   Lab Results  Component Value Date   CHOL 135 09/19/2015   HDL 50 09/19/2015   LDLCALC 55 09/19/2015   TRIG 149 09/19/2015   CHOLHDL 4.1 07/01/2012    Significant Diagnostic Results in last 30 days:  No results found.  Assessment/Plan 1. Sprain of right ankle, unspecified ligament, subsequent  encounter Improving, continue walking boot for transfers and OT  2. Chronic diastolic congestive heart failure (HCC) Compensated. Continue lasix 40 mg qd and monitor weight  3. Atrioventricular block, complete [facility]) S/p pacemaker. Family no longer wants to check remote transmission due to goals of care.   4. Essential hypertension Controlled, continue arb therapy  5. Vascular dementia without behavioral disturbance ([place]) Progressing over time. Of namenda, continue supportive care   6. Depression, recurrent ([place]) Periods of sadness are present due to Covid 19 but no significant depressive issues. Continue Ativan at bedtime and lexapro 20 mg qd. Would not taper.     Family/ staff Communication: staff/resident  Labs/tests ordered:  NA

## 2019-02-06 DIAGNOSIS — I69151 Hemiplegia and hemiparesis following nontraumatic intracerebral hemorrhage affecting right dominant side: Secondary | ICD-10-CM | POA: Diagnosis not present

## 2019-02-06 DIAGNOSIS — I872 Venous insufficiency (chronic) (peripheral): Secondary | ICD-10-CM | POA: Diagnosis not present

## 2019-02-06 DIAGNOSIS — R600 Localized edema: Secondary | ICD-10-CM | POA: Diagnosis not present

## 2019-02-06 DIAGNOSIS — M13871 Other specified arthritis, right ankle and foot: Secondary | ICD-10-CM | POA: Diagnosis not present

## 2019-02-06 DIAGNOSIS — M25571 Pain in right ankle and joints of right foot: Secondary | ICD-10-CM | POA: Diagnosis not present

## 2019-02-09 DIAGNOSIS — M25571 Pain in right ankle and joints of right foot: Secondary | ICD-10-CM | POA: Diagnosis not present

## 2019-02-09 DIAGNOSIS — I872 Venous insufficiency (chronic) (peripheral): Secondary | ICD-10-CM | POA: Diagnosis not present

## 2019-02-09 DIAGNOSIS — M13871 Other specified arthritis, right ankle and foot: Secondary | ICD-10-CM | POA: Diagnosis not present

## 2019-02-09 DIAGNOSIS — R600 Localized edema: Secondary | ICD-10-CM | POA: Diagnosis not present

## 2019-02-09 DIAGNOSIS — I69151 Hemiplegia and hemiparesis following nontraumatic intracerebral hemorrhage affecting right dominant side: Secondary | ICD-10-CM | POA: Diagnosis not present

## 2019-02-12 ENCOUNTER — Non-Acute Institutional Stay (SKILLED_NURSING_FACILITY): Payer: Medicare Other | Admitting: Adult Health

## 2019-02-12 ENCOUNTER — Encounter: Payer: Self-pay | Admitting: Adult Health

## 2019-02-12 DIAGNOSIS — M13871 Other specified arthritis, right ankle and foot: Secondary | ICD-10-CM | POA: Diagnosis not present

## 2019-02-12 DIAGNOSIS — R600 Localized edema: Secondary | ICD-10-CM | POA: Diagnosis not present

## 2019-02-12 DIAGNOSIS — T148XXA Other injury of unspecified body region, initial encounter: Secondary | ICD-10-CM | POA: Diagnosis not present

## 2019-02-12 DIAGNOSIS — S93401D Sprain of unspecified ligament of right ankle, subsequent encounter: Secondary | ICD-10-CM | POA: Diagnosis not present

## 2019-02-12 DIAGNOSIS — I69151 Hemiplegia and hemiparesis following nontraumatic intracerebral hemorrhage affecting right dominant side: Secondary | ICD-10-CM | POA: Diagnosis not present

## 2019-02-12 DIAGNOSIS — I872 Venous insufficiency (chronic) (peripheral): Secondary | ICD-10-CM | POA: Diagnosis not present

## 2019-02-12 DIAGNOSIS — M25571 Pain in right ankle and joints of right foot: Secondary | ICD-10-CM | POA: Diagnosis not present

## 2019-02-12 NOTE — Progress Notes (Signed)
Location:  Occupational psychologist of Service:  SNF (31) Provider:   Cindi Carbon, ANP [place] [phone]  [name], DO  Patient Care Team: [name], DO as PCP - General (Geriatric Medicine) Community, Well [name], Altamese Dilling, MD as Attending Physician (Gastroenterology) Garvin Fila, MD as Consulting Physician (Neurology) Julianne Handler, NP as Nurse Practitioner (Hospice and Palliative Medicine) Luberta Mutter, MD as Consulting Physician (Ophthalmology)  Extended Emergency Contact Information Primary Emergency Contact: [place] Address: [address]          [place], [place] [address] [name] of [place] Phone: [phone] Mobile Phone: [phone] Relation: Daughter  Code Status:  DNR Goals of care: Advanced Directive information Advanced Directives 01/02/2019  Does Patient Have a Medical Advance Directive? Yes  Type of Paramedic of [place];Out of facility DNR (pink MOST or yellow form);Living will  Does patient want to make changes to medical advance directive? No - Patient declined  Copy of [place] in Chart? Yes - validated most recent copy scanned in chart (See row information)  Pre-existing out of facility DNR order (yellow form or pink MOST form) -     Chief Complaint  Patient presents with  . Acute Visit    blood blister    HPI:  Pt is a 85 y.o. female seen today for a blood blister. The nurses report that it has been present for almost a week. They are not sure how it happened. The patient can not remember due to her underlying dementia. They are questioning if she should continue the boot that she is wearing for an ankle sprain. The blood blister is above the boot line. She has compression hose that have not been applied due to the blister. The area is tender to the patient. Her right ankle sprain which occurred in October has  improved with less pain per OT but the patient reports some pain with weight bearing in the stand up lift. There is less swelling and point tenderness as well per pt.  She has a stroke on that side so there is some chronic joint laxity as well.     Past Medical History:  Diagnosis Date  . Abnormality of gait 2007  . Acquired cyst of kidney 2002  . Acute sinus infection 11/25/2012  . Allergic rhinitis due to pollen   . Altered mental status 11/28/2012  . Anxiety state, unspecified   . Atrioventricular block, complete ([place]) 2003   s/p PPM 2003, generator chnage 2011  . Cardiac pacemaker in situ 2003  . Coronary atherosclerosis of native coronary artery 2003   non obstructive by Cath 2003  . CVA (cerebral infarction) 06/28/2012   rt hemiparesis, dysphagia  . Dendritic keratitis 2012  . Dysuria   . Edema 2003  . Esophageal dysmotility 08/19/2012  . Food impaction of esophagus 08/15/2012  . HTN (hypertension) 06/28/2012  . Hypertension   . Macular degeneration (senile) of retina, unspecified 2012  . Malignant neoplasm of breast (female), unspecified site 1970   S/P Rt radical mastectomy  . NSTEMI (non-ST elevated myocardial infarction) ([place]) 06/28/2012  . Osteoarthrosis, unspecified whether generalized or localized, unspecified site 2013  . Pacemaker   . Personal history of colonic polyps     s/p colonoscopy/polypectomy 2003  . Pure hyperglyceridemia 2004  . Senile osteoporosis 2003  . Stroke ([place]) 06/28/2012  . Tinnitus 2007  . Type II or unspecified type diabetes mellitus without  mention of complication, not stated as uncontrolled 2003  . Unspecified arthropathy, pelvic region and thigh 2010  . Unspecified glaucoma(365.9) 2013  . Unspecified hypothyroidism 2009  . Unspecified vitamin D deficiency 2009  . Urinary retention 07/07/2012   Past Surgical History:  Procedure Laterality Date  . BOTOX INJECTION N/A 10/03/2012   Procedure: BOTOX INJECTION;  Surgeon: [name], MD;  Location:  [facility];  Service: Endoscopy;  Laterality: N/A;  . ESOPHAGOGASTRODUODENOSCOPY N/A 08/15/2012   Procedure: ESOPHAGOGASTRODUODENOSCOPY (EGD);  Surgeon: [name], MD;  Location: [facility] ENDOSCOPY;  Service: Endoscopy;  Laterality: N/A;  . ESOPHAGOGASTRODUODENOSCOPY N/A 08/15/2012   Procedure: ESOPHAGOGASTRODUODENOSCOPY (EGD);  Surgeon: [name], MD;  Location: [place];  Service: Endoscopy;  Laterality: N/A;  . ESOPHAGOGASTRODUODENOSCOPY N/A 10/03/2012   Procedure: ESOPHAGOGASTRODUODENOSCOPY (EGD);  Surgeon: [name], MD;  Location: [facility];  Service: Endoscopy;  Laterality: N/A;  . ESOPHAGOGASTRODUODENOSCOPY N/A 12/22/2012   Procedure: ESOPHAGOGASTRODUODENOSCOPY (EGD);  Surgeon: [name], MD;  Location: [facility] ENDOSCOPY;  Service: Endoscopy;  Laterality: N/A;  . ESOPHAGOSCOPY N/A 08/15/2012   Procedure: ESOPHAGOSCOPY;  Surgeon: [name], MD;  Location: [place];  Service: ENT;  Laterality: N/A;  . FOREIGN BODY REMOVAL ESOPHAGEAL N/A 08/15/2012   Procedure: REMOVAL FOREIGN BODY ESOPHAGEAL;  Surgeon: [name], MD;  Location: [place];  Service: ENT;  Laterality: N/A;  . INSERT / REPLACE / REMOVE PACEMAKER    . MASTECTOMY Right 1970   Cancer  . SAVORY DILATION N/A 10/03/2012   Procedure: SAVORY DILATION;  Surgeon: [name], MD;  [facility];  Service: Endoscopy;  Laterality: N/A;    Allergies  Allergen Reactions  . Augmentin [Amoxicillin-Pot Clavulanate] Nausea And Vomiting  . Lactose Intolerance (Gi)   . Shingrix [Zoster Vac Recomb Adjuvanted] Rash    Outpatient Encounter Medications as of 02/12/2019  Medication Sig  . acetaminophen (TYLENOL) 650 MG CR tablet Take 650 mg by mouth 2 (two) times daily. For pain and every 4 hours as needed for up to 72 hours, document area of discomfort and effectiveness or medication  . carboxymethylcellulose (REFRESH PLUS) 0.5 % SOLN 2 drops 2 (two) times daily.   . Dentifrices (BIOTENE DRY MOUTH CARE DT) Take 1 spray by mouth 3 (three)  times daily as needed.   Marland Kitchen escitalopram (LEXAPRO) 20 MG tablet Take 20 mg by mouth daily. For depression/ anxiety.  . furosemide (LASIX) 40 MG tablet Take 40 mg by mouth.  Marland Kitchen GLUCERNA (GLUCERNA) LIQD Take 1 Can by mouth daily. Divided 1/2 can with breakfast and 1/2 can at lunch  . levothyroxine (SYNTHROID, LEVOTHROID) 75 MCG tablet Take 75 mcg by mouth daily before breakfast. For thyroid therapy  . liver oil-zinc oxide (DESITIN) 40 % ointment Apply 1 application topically as needed for irritation (to buttocks and inner thighs).   . LORazepam (ATIVAN) 0.5 MG tablet Take 0.5 tablets (0.25 mg total) by mouth at bedtime.  Marland Kitchen losartan (COZAAR) 50 MG tablet Take 25 mg by mouth daily. In the morning  . metoprolol tartrate (LOPRESSOR) 25 MG tablet Take 25 mg by mouth 2 (two) times daily.  Marland Kitchen omeprazole (PRILOSEC) 20 MG capsule Take 20 mg by mouth daily.  . potassium chloride SA (K-DUR,KLOR-CON) 20 MEQ tablet Take 20 mEq by mouth daily.  Addison Lank Hazel 14 % LIQD Apply topically as needed (for hemorrhoid flare up).   No facility-administered encounter medications on file as of 02/12/2019.    Review of Systems  Constitutional: Negative for  activity change, appetite change, chills, diaphoresis, fatigue, fever and unexpected weight change.  Musculoskeletal: Positive for gait problem and joint swelling. Negative for arthralgias, back pain, myalgias, neck pain and neck stiffness.       Right ankle pain with standing.   Skin: Positive for color change and wound. Negative for rash.    Immunization History  Administered Date(s) Administered  . Influenza Inj Mdck Quad Pf 12/22/2015  . Influenza Whole 12/17/2012  . Influenza, High Dose Seasonal PF 12/18/2018  . Influenza,inj,Quad PF,6+ Mos 12/24/2017  . Influenza-Unspecified 12/08/2013, 12/16/2014, 12/27/2016  . PPD Test 07/18/2012  . Pneumococcal Conjugate-13 08/27/2015   Pertinent  Health Maintenance Due  Topic Date Due  . FOOT EXAM  07/14/2019  .  OPHTHALMOLOGY EXAM  09/22/2019  . INFLUENZA VACCINE  Completed  . PNA vac Low Risk Adult  Completed   Fall Risk  01/02/2019 07/14/2018 04/08/2018 07/10/2017 06/27/2016  Falls in the past year? 0 0 Exclusion - non ambulatory No No  Number falls in past yr: 0 0 - - -  Injury with Fall? 0 0 - - -  Risk for fall due to : Impaired balance/gait Impaired balance/gait - - -  Follow up Falls evaluation completed - - - -   Functional Status Survey:    There were no vitals filed for this visit. There is no height or weight on file to calculate BMI. Physical Exam Constitutional:      Appearance: Normal appearance.  Musculoskeletal:        General: Tenderness (right lateral and medial malleolus. Joint laxity noted on exam which is chronic ) present.     Right lower leg: Edema present.  Skin:    General: Skin is warm and dry.     Comments: Right upper anterior shin with large blood blister. She has mild erythema and swelling to this leg that is chronic due to immobility associated with her CVA.    Neurological:     Mental Status: She is alert.     Labs reviewed: Recent Labs    03/18/18 0600  NA 144  K 4.4  BUN 15  CREATININE 0.5   No results for input(s): AST, ALT, ALKPHOS, BILITOT, PROT, ALBUMIN in the last 8760 hours. No results for input(s): WBC, NEUTROABS, HGB, HCT, MCV, PLT in the last 8760 hours. Lab Results  Component Value Date   TSH 1.32 10/07/2018   Lab Results  Component Value Date   HGBA1C 7.7 10/07/2018   Lab Results  Component Value Date   CHOL 135 09/19/2015   HDL 50 09/19/2015   LDLCALC 55 09/19/2015   TRIG 149 09/19/2015   CHOLHDL 4.1 07/01/2012    Significant Diagnostic Results in last 30 days:  No results found.  Assessment/Plan 1. Sprain of right ankle, unspecified ligament, subsequent encounter Improved but continues with mild pain with weight bearing. May use boot for two more weeks then d/c.    2. Blood blister Not due to the boot she is wearing.  Likely she hit an object which caused the blister. Cover with dry dressing and monitor for redness, warmth, fever, or swelling. Hold compression hose on the right until this resolves.     Family/ staff Communication: resident/nurse  Labs/tests ordered:  NA

## 2019-02-13 DIAGNOSIS — I872 Venous insufficiency (chronic) (peripheral): Secondary | ICD-10-CM | POA: Diagnosis not present

## 2019-02-13 DIAGNOSIS — M13871 Other specified arthritis, right ankle and foot: Secondary | ICD-10-CM | POA: Diagnosis not present

## 2019-02-13 DIAGNOSIS — I69151 Hemiplegia and hemiparesis following nontraumatic intracerebral hemorrhage affecting right dominant side: Secondary | ICD-10-CM | POA: Diagnosis not present

## 2019-02-13 DIAGNOSIS — R600 Localized edema: Secondary | ICD-10-CM | POA: Diagnosis not present

## 2019-02-13 DIAGNOSIS — M25571 Pain in right ankle and joints of right foot: Secondary | ICD-10-CM | POA: Diagnosis not present

## 2019-02-16 DIAGNOSIS — M25571 Pain in right ankle and joints of right foot: Secondary | ICD-10-CM | POA: Diagnosis not present

## 2019-02-16 DIAGNOSIS — Z9189 Other specified personal risk factors, not elsewhere classified: Secondary | ICD-10-CM | POA: Diagnosis not present

## 2019-02-16 DIAGNOSIS — I69151 Hemiplegia and hemiparesis following nontraumatic intracerebral hemorrhage affecting right dominant side: Secondary | ICD-10-CM | POA: Diagnosis not present

## 2019-02-16 DIAGNOSIS — I872 Venous insufficiency (chronic) (peripheral): Secondary | ICD-10-CM | POA: Diagnosis not present

## 2019-02-16 DIAGNOSIS — R600 Localized edema: Secondary | ICD-10-CM | POA: Diagnosis not present

## 2019-02-16 DIAGNOSIS — M13871 Other specified arthritis, right ankle and foot: Secondary | ICD-10-CM | POA: Diagnosis not present

## 2019-02-19 DIAGNOSIS — I872 Venous insufficiency (chronic) (peripheral): Secondary | ICD-10-CM | POA: Diagnosis not present

## 2019-02-19 DIAGNOSIS — R600 Localized edema: Secondary | ICD-10-CM | POA: Diagnosis not present

## 2019-02-19 DIAGNOSIS — I69151 Hemiplegia and hemiparesis following nontraumatic intracerebral hemorrhage affecting right dominant side: Secondary | ICD-10-CM | POA: Diagnosis not present

## 2019-02-19 DIAGNOSIS — M13871 Other specified arthritis, right ankle and foot: Secondary | ICD-10-CM | POA: Diagnosis not present

## 2019-02-19 DIAGNOSIS — M25571 Pain in right ankle and joints of right foot: Secondary | ICD-10-CM | POA: Diagnosis not present

## 2019-02-23 DIAGNOSIS — Z9189 Other specified personal risk factors, not elsewhere classified: Secondary | ICD-10-CM | POA: Diagnosis not present

## 2019-03-02 DIAGNOSIS — R600 Localized edema: Secondary | ICD-10-CM | POA: Diagnosis not present

## 2019-03-02 DIAGNOSIS — I69151 Hemiplegia and hemiparesis following nontraumatic intracerebral hemorrhage affecting right dominant side: Secondary | ICD-10-CM | POA: Diagnosis not present

## 2019-03-02 DIAGNOSIS — I872 Venous insufficiency (chronic) (peripheral): Secondary | ICD-10-CM | POA: Diagnosis not present

## 2019-03-02 DIAGNOSIS — M13871 Other specified arthritis, right ankle and foot: Secondary | ICD-10-CM | POA: Diagnosis not present

## 2019-03-02 DIAGNOSIS — M25571 Pain in right ankle and joints of right foot: Secondary | ICD-10-CM | POA: Diagnosis not present

## 2019-03-03 DIAGNOSIS — M25571 Pain in right ankle and joints of right foot: Secondary | ICD-10-CM | POA: Diagnosis not present

## 2019-03-03 DIAGNOSIS — M13871 Other specified arthritis, right ankle and foot: Secondary | ICD-10-CM | POA: Diagnosis not present

## 2019-03-03 DIAGNOSIS — I872 Venous insufficiency (chronic) (peripheral): Secondary | ICD-10-CM | POA: Diagnosis not present

## 2019-03-03 DIAGNOSIS — I69151 Hemiplegia and hemiparesis following nontraumatic intracerebral hemorrhage affecting right dominant side: Secondary | ICD-10-CM | POA: Diagnosis not present

## 2019-03-03 DIAGNOSIS — R600 Localized edema: Secondary | ICD-10-CM | POA: Diagnosis not present

## 2019-03-04 DIAGNOSIS — I872 Venous insufficiency (chronic) (peripheral): Secondary | ICD-10-CM | POA: Diagnosis not present

## 2019-03-04 DIAGNOSIS — I69151 Hemiplegia and hemiparesis following nontraumatic intracerebral hemorrhage affecting right dominant side: Secondary | ICD-10-CM | POA: Diagnosis not present

## 2019-03-04 DIAGNOSIS — R600 Localized edema: Secondary | ICD-10-CM | POA: Diagnosis not present

## 2019-03-04 DIAGNOSIS — M25571 Pain in right ankle and joints of right foot: Secondary | ICD-10-CM | POA: Diagnosis not present

## 2019-03-04 DIAGNOSIS — M13871 Other specified arthritis, right ankle and foot: Secondary | ICD-10-CM | POA: Diagnosis not present

## 2019-03-08 DIAGNOSIS — Z9189 Other specified personal risk factors, not elsewhere classified: Secondary | ICD-10-CM | POA: Diagnosis not present

## 2019-03-09 DIAGNOSIS — M25571 Pain in right ankle and joints of right foot: Secondary | ICD-10-CM | POA: Diagnosis not present

## 2019-03-09 DIAGNOSIS — M13871 Other specified arthritis, right ankle and foot: Secondary | ICD-10-CM | POA: Diagnosis not present

## 2019-03-09 DIAGNOSIS — R600 Localized edema: Secondary | ICD-10-CM | POA: Diagnosis not present

## 2019-03-09 DIAGNOSIS — I872 Venous insufficiency (chronic) (peripheral): Secondary | ICD-10-CM | POA: Diagnosis not present

## 2019-03-09 DIAGNOSIS — I69151 Hemiplegia and hemiparesis following nontraumatic intracerebral hemorrhage affecting right dominant side: Secondary | ICD-10-CM | POA: Diagnosis not present

## 2019-03-11 DIAGNOSIS — R600 Localized edema: Secondary | ICD-10-CM | POA: Diagnosis not present

## 2019-03-11 DIAGNOSIS — M25571 Pain in right ankle and joints of right foot: Secondary | ICD-10-CM | POA: Diagnosis not present

## 2019-03-11 DIAGNOSIS — I872 Venous insufficiency (chronic) (peripheral): Secondary | ICD-10-CM | POA: Diagnosis not present

## 2019-03-11 DIAGNOSIS — M13871 Other specified arthritis, right ankle and foot: Secondary | ICD-10-CM | POA: Diagnosis not present

## 2019-03-11 DIAGNOSIS — I69151 Hemiplegia and hemiparesis following nontraumatic intracerebral hemorrhage affecting right dominant side: Secondary | ICD-10-CM | POA: Diagnosis not present

## 2019-03-12 ENCOUNTER — Encounter: Payer: Self-pay | Admitting: Adult Health

## 2019-03-12 ENCOUNTER — Non-Acute Institutional Stay (SKILLED_NURSING_FACILITY): Payer: Medicare Other | Admitting: Adult Health

## 2019-03-12 DIAGNOSIS — T8090XA Unspecified complication following infusion and therapeutic injection, initial encounter: Secondary | ICD-10-CM | POA: Diagnosis not present

## 2019-03-12 DIAGNOSIS — S93401D Sprain of unspecified ligament of right ankle, subsequent encounter: Secondary | ICD-10-CM

## 2019-03-12 DIAGNOSIS — E118 Type 2 diabetes mellitus with unspecified complications: Secondary | ICD-10-CM | POA: Diagnosis not present

## 2019-03-12 DIAGNOSIS — I5032 Chronic diastolic (congestive) heart failure: Secondary | ICD-10-CM | POA: Diagnosis not present

## 2019-03-12 DIAGNOSIS — I442 Atrioventricular block, complete: Secondary | ICD-10-CM | POA: Diagnosis not present

## 2019-03-12 DIAGNOSIS — Z9189 Other specified personal risk factors, not elsewhere classified: Secondary | ICD-10-CM | POA: Diagnosis not present

## 2019-03-13 NOTE — Progress Notes (Signed)
Location:  Occupational psychologist of Service:  SNF (31) Provider:   Cindi Carbon, ANP [place] [phone]   [name], DO  Patient Care Team: [name], DO as PCP - General (Geriatric Medicine) [facility], [name], Altamese Dilling, MD as Attending Physician (Gastroenterology) Garvin Fila, MD as Consulting Physician (Neurology) Julianne Handler, NP as Nurse Practitioner (Hospice and Palliative Medicine) Luberta Mutter, MD as Consulting Physician (Ophthalmology)  Extended Emergency Contact Information Primary Emergency Contact: Sher[place] Address: [address]          [place],  [address] [name] of [place] Phone: [phone] Mobile Phone: [phone] Relation: Daughter  Code Status:  DNR Goals of care: Advanced Directive information Advanced Directives 01/02/2019  Does Patient Have a Medical Advance Directive? Yes  Type of Paramedic of [place];Out of facility DNR (pink MOST or yellow form);Living will  Does patient want to make changes to medical advance directive? No - Patient declined  Copy of [place] in Chart? Yes - validated most recent copy scanned in chart (See row information)  Pre-existing out of facility DNR order (yellow form or pink MOST form) -     Chief Complaint  Patient presents with   Medical Management of Chronic Issues    HPI:  Pt is a 86 y.o. female seen today for medical management of chronic diseases.    Right Ankle sprain: improved swelling and pain. She continues to use a boot for support in the stand up lift. No pain is reported.   She had her covid vaccine this week and now has a temp of 99.6 with left arm swelling and pain. No wheezing or sob is noted.   She is monitored bi-weekly for covid 19 with swabs and has been negative.   CHF: diastolic Her weight is unchanged. She has chronic edema in her feet that  is unchanged. She wears compression hose. No sob, doe, or pnd.  Wt Readings from Last 3 Encounters:  03/13/19 171 lb 3.2 oz (77.7 kg)  02/05/19 170 lb 3.2 oz (77.2 kg)  01/05/19 170 lb 3.2 oz (77.2 kg)   She has a blood blister with an unclear etiology to the right shin that is open and the staff are doing dressing changes with polymem.   Past Medical History:  Diagnosis Date   Abnormality of gait 2007   Acquired cyst of kidney 2002   Acute sinus infection 11/25/2012   Allergic rhinitis due to pollen    Altered mental status 11/28/2012   Anxiety state, unspecified    Atrioventricular block, complete ([place]) 2003   s/p PPM 2003, generator chnage 2011   Cardiac pacemaker in situ 2003   Coronary atherosclerosis of native coronary artery 2003   non obstructive by Cath 2003   CVA (cerebral infarction) 06/28/2012   rt hemiparesis, dysphagia   Dendritic keratitis 2012   Dysuria    Edema 2003   Esophageal dysmotility 08/19/2012   Food impaction of esophagus 08/15/2012   HTN (hypertension) 06/28/2012   Hypertension    Macular degeneration (senile) of retina, unspecified 2012   Malignant neoplasm of breast (female), unspecified site 1970   S/P Rt radical mastectomy   NSTEMI (non-ST elevated myocardial infarction) ([place]) 06/28/2012   Osteoarthrosis, unspecified whether generalized or localized, unspecified site 2013   Pacemaker    Personal history of colonic polyps     s/p colonoscopy/polypectomy 2003   Pure hyperglyceridemia 2004  Senile osteoporosis 2003   Stroke [facility]) 06/28/2012   Tinnitus 2007   Type II or unspecified type diabetes mellitus without mention of complication, not stated as uncontrolled 2003   Unspecified arthropathy, pelvic region and thigh 2010   Unspecified glaucoma(365.9) 2013   Unspecified hypothyroidism 2009   Unspecified vitamin D deficiency 2009   Urinary retention 07/07/2012   Past Surgical History:  Procedure Laterality Date    BOTOX INJECTION N/A 10/03/2012   Procedure: BOTOX INJECTION;  Surgeon: [name], MD;  [facility]: [facility] ENDOSCOPY;  Service: Endoscopy;  Laterality: N/A;   ESOPHAGOGASTRODUODENOSCOPY N/A 08/15/2012   Procedure: ESOPHAGOGASTRODUODENOSCOPY (EGD);  Surgeon: [name], MD;  Location: [facility] ENDOSCOPY;  Service: Endoscopy;  Laterality: N/A;   ESOPHAGOGASTRODUODENOSCOPY N/A 08/15/2012   Procedure: ESOPHAGOGASTRODUODENOSCOPY (EGD);  Surgeon: [name], MD;  Location: [place];  Service: Endoscopy;  Laterality: N/A;   ESOPHAGOGASTRODUODENOSCOPY N/A 10/03/2012   Procedure: ESOPHAGOGASTRODUODENOSCOPY (EGD);  Surgeon: [name], MD;  Location: [facility];  Service: Endoscopy;  Laterality: N/A;   ESOPHAGOGASTRODUODENOSCOPY N/A 12/22/2012   Procedure: ESOPHAGOGASTRODUODENOSCOPY (EGD);  Surgeon: [name], MD;  Location: [facility] ENDOSCOPY;  Service: Endoscopy;  Laterality: N/A;   ESOPHAGOSCOPY N/A 08/15/2012   Procedure: ESOPHAGOSCOPY;  Surgeon: [name], MD;  Location: [place];  Service: ENT;  Laterality: N/A;   FOREIGN BODY REMOVAL ESOPHAGEAL N/A 08/15/2012   Procedure: REMOVAL FOREIGN BODY ESOPHAGEAL;  Surgeon: [name], MD;  Location: [place];  Service: ENT;  Laterality: N/A;   INSERT / REPLACE / REMOVE PACEMAKER     MASTECTOMY Right 1970   Cancer   SAVORY DILATION N/A 10/03/2012   Procedure: SAVORY DILATION;  Surgeon: [name], MD;  [facility] ENDOS[facility];  Service: Endoscopy;  Laterality: N/A;    Allergies  Allergen Reactions   Augmentin [Amoxicillin-Pot Clavulanate] Nausea And Vomiting   Lactose Intolerance (Gi)    Shingrix [Zoster Vac Recomb Adjuvanted] Rash    Outpatient Encounter Medications as of 03/12/2019  Medication Sig   acetaminophen (TYLENOL) 650 MG CR tablet Take 650 mg by mouth 2 (two) times daily. For pain and every 4 hours as needed for up to 72 hours, document area of discomfort and effectiveness or medication   carboxymethylcellulose (REFRESH PLUS) 0.5 % SOLN 2 drops  2 (two) times daily.    Dentifrices (BIOTENE DRY MOUTH CARE DT) Take 1 spray by mouth 3 (three) times daily as needed.    escitalopram (LEXAPRO) 20 MG tablet Take 20 mg by mouth daily. For depression/ anxiety.   furosemide (LASIX) 40 MG tablet Take 40 mg by mouth.   GLUCERNA (GLUCERNA) LIQD Take 1 Can by mouth daily. Divided 1/2 can with breakfast and 1/2 can at lunch   levothyroxine (SYNTHROID, LEVOTHROID) 75 MCG tablet Take 75 mcg by mouth daily before breakfast. For thyroid therapy   liver oil-zinc oxide (DESITIN) 40 % ointment Apply 1 application topically as needed for irritation (to buttocks and inner thighs).    LORazepam (ATIVAN) 0.5 MG tablet Take 0.5 tablets (0.25 mg total) by mouth at bedtime.   losartan (COZAAR) 50 MG tablet Take 25 mg by mouth daily. In the morning   metoprolol tartrate (LOPRESSOR) 25 MG tablet Take 25 mg by mouth 2 (two) times daily.   omeprazole (PRILOSEC) 20 MG capsule Take 20 mg by mouth daily.   potassium chloride SA (K-DUR,KLOR-CON) 20 MEQ tablet Take 20 mEq by mouth daily.   Witch Hazel 14 % LIQD Apply topically as needed (for hemorrhoid flare  up).   No facility-administered encounter medications on file as of 03/12/2019.    Review of Systems  Constitutional: Positive for fever (low grade). Negative for chills and diaphoresis.  Respiratory: Negative for cough and wheezing.   Cardiovascular: Positive for leg swelling. Negative for chest pain.  Gastrointestinal: Negative for abdominal pain, constipation, diarrhea, nausea and vomiting.  Genitourinary: Negative for dysuria and urgency.  Musculoskeletal: Positive for arthralgias and gait problem. Negative for back pain, joint swelling, myalgias and neck pain.  Skin: Positive for rash and wound.  Neurological: Negative for weakness.  Psychiatric/Behavioral: Positive for confusion. Negative for agitation and behavioral problems.    Immunization History  Administered Date(s) Administered    Influenza Inj Mdck Quad Pf 12/22/2015   Influenza Whole 12/17/2012   Influenza, High Dose Seasonal PF 12/18/2018   Influenza,inj,Quad PF,6+ Mos 12/24/2017   Influenza-Unspecified 12/08/2013, 12/16/2014, 12/27/2016   Moderna SARS-COVID-2 Vaccination 03/10/2019   PPD Test 07/18/2012   Pneumococcal Conjugate-13 08/27/2015   Pertinent  Health Maintenance Due  Topic Date Due   FOOT EXAM  07/14/2019   OPHTHALMOLOGY EXAM  09/22/2019   INFLUENZA VACCINE  Completed   PNA vac Low Risk Adult  Completed   Fall Risk  01/02/2019 07/14/2018 04/08/2018 07/10/2017 06/27/2016  Falls in the past year? 0 0 Exclusion - non ambulatory No No  Number falls in past yr: 0 0 - - -  Injury with Fall? 0 0 - - -  Risk for fall due to : Impaired balance/gait Impaired balance/gait - - -  Follow up Falls evaluation completed - - - -   Functional Status Survey:    Vitals:   03/13/19 1618  Temp: 99.6 F (37.6 C)  Weight: 171 lb 3.2 oz (77.7 kg)   Body mass index is 32.35 kg/m. Physical Exam Vitals and nursing note reviewed.  Constitutional:      General: She is not in acute distress.    Appearance: She is not diaphoretic.  HENT:     Head: Normocephalic and atraumatic.  Neck:     Vascular: No JVD.  Cardiovascular:     Rate and Rhythm: Normal rate and regular rhythm.     Heart sounds: No murmur.  Pulmonary:     Effort: Pulmonary effort is normal. No respiratory distress.     Breath sounds: Normal breath sounds. No wheezing.  Abdominal:     General: Bowel sounds are normal. There is no distension.     Palpations: Abdomen is soft.     Tenderness: There is no abdominal tenderness.  Musculoskeletal:        General: No tenderness, deformity or signs of injury.     Right lower leg: Edema present.     Left lower leg: Edema present.  Skin:    General: Skin is warm and dry.     Findings: Erythema (to LUA with mild tenderness, no drainage or swelling) present.  Neurological:     Mental Status: She  is alert and oriented to person, place, and time.  Psychiatric:        Mood and Affect: Mood normal.     Labs reviewed: Recent Labs    03/18/18 0600  NA 144  K 4.4  BUN 15  CREATININE 0.5   No results for input(s): AST, ALT, ALKPHOS, BILITOT, PROT, ALBUMIN in the last 8760 hours. No results for input(s): WBC, NEUTROABS, HGB, HCT, MCV, PLT in the last 8760 hours. Lab Results  Component Value Date   TSH 1.32 10/07/2018  Lab Results  Component Value Date   HGBA1C 7.7 10/07/2018   Lab Results  Component Value Date   CHOL 135 09/19/2015   HDL 50 09/19/2015   LDLCALC 55 09/19/2015   TRIG 149 09/19/2015   CHOLHDL 4.1 07/01/2012    Significant Diagnostic Results in last 30 days:  No results found.  Assessment/Plan 1. Injection site reaction, initial encounter Due to covid 19 immunization Continue to monitor and apply ice as needed. Tylenol as needed for arm soreness.   2. Sprain of right ankle, unspecified ligament, subsequent encounter Improved Continue supportive boot with transfers   3. Atrioventricular block, complete [facility]) Has pacemaker but her family has opted to perform remote transmissions any longer due to her goals of care.   4. Chronic diastolic congestive heart failure (HCC) Compensated Continue Lasix 40 mg qd and Kdur 20  Meq qd Continue cozaar 25 mg qd   5. Controlled type 2 diabetes mellitus with complication, without long-term current use of insulin (HCC) Diet controlled, given her age and goals of care goal A1C is 8% or less Lab Results  Component Value Date   HGBA1C 7.7 10/07/2018       Family/ staff Communication: discussed with resident   Labs/tests ordered:  NA

## 2019-03-14 LAB — NOVEL CORONAVIRUS, NAA: SARS-CoV-2, NAA: DETECTED

## 2019-03-26 ENCOUNTER — Non-Acute Institutional Stay (SKILLED_NURSING_FACILITY): Payer: Medicare Other | Admitting: Adult Health

## 2019-03-26 DIAGNOSIS — F419 Anxiety disorder, unspecified: Secondary | ICD-10-CM

## 2019-03-26 DIAGNOSIS — U071 COVID-19: Secondary | ICD-10-CM | POA: Diagnosis not present

## 2019-03-26 MED ORDER — LORAZEPAM 0.5 MG PO TABS: 0.2500 mg | ORAL_TABLET | Freq: Three times a day (TID) | ORAL | 1 refills | Status: DC | PRN

## 2019-03-27 ENCOUNTER — Encounter: Payer: Self-pay | Admitting: Adult Health

## 2019-03-27 NOTE — Progress Notes (Signed)
Location:  Occupational psychologist of Service:  SNF (31) Provider:   Cindi Carbon, ANP [place] [phone]   [name], DO  Patient Care Team: [name], DO as PCP - General (Geriatric Medicine) [facility] [name], Altamese Dilling, MD as Attending Physician (Gastroenterology) Garvin Fila, MD as Consulting Physician (Neurology) Julianne Handler, NP as Nurse Practitioner (Hospice and Palliative Medicine) Luberta Mutter, MD as Consulting Physician (Ophthalmology)  Extended Emergency Contact Information Primary Emergency Contact: [place] Address: [address]          [place], [place] [address] [name] of [place] Phone: [phone] Mobile Phone: [phone] Relation: Daughter  Code Status:  DNR Goals of care: Advanced Directive information Advanced Directives 01/02/2019  Does Patient Have a Medical Advance Directive? Yes  Type of Paramedic of [place];Out of facility DNR (pink MOST or yellow form);Living will  Does patient want to make changes to medical advance directive? No - Patient declined  Copy of [place] in Chart? Yes - validated most recent copy scanned in chart (See row information)  Pre-existing out of facility DNR order (yellow form or pink MOST form) -     Chief Complaint  Patient presents with  . Acute Visit    anxiety    HPI:  Pt is a 86 y.o. female seen today for an acute visit for anxiety. She tested positive for covid on 1/9.  She did receive her first Moderna covid vaccine on 1/5.  She has denied any sore throat, loss of taste or smell, sob, cough, fever, low 02 sats etc. In review of matrix records there was one low grade temp on 1/11.  Prior to this she had a site reaction from the vaccine which is resolving. The nurse supervisor reports that she has experienced anxiety while in isolation and calls her daughter frequently for  support. Her daughter is requesting something for anxiety to alleviate this symptom. She typically moves throughout the facility in a PWC and the loss of this independence and activities at [facility] has produce anxiety and flat mood. No issues sleeping or appetite per pt. Denies SI.   Past Medical History:  Diagnosis Date  . Abnormality of gait 2007  . Acquired cyst of kidney 2002  . Acute sinus infection 11/25/2012  . Allergic rhinitis due to pollen   . Altered mental status 11/28/2012  . Anxiety state, unspecified   . Atrioventricular block, complete ([place]) 2003   s/p PPM 2003, generator chnage 2011  . Cardiac pacemaker in situ 2003  . Coronary atherosclerosis of native coronary artery 2003   non obstructive by Cath 2003  . CVA (cerebral infarction) 06/28/2012   rt hemiparesis, dysphagia  . Dendritic keratitis 2012  . Dysuria   . Edema 2003  . Esophageal dysmotility 08/19/2012  . Food impaction of esophagus 08/15/2012  . HTN (hypertension) 06/28/2012  . Hypertension   . Macular degeneration (senile) of retina, unspecified 2012  . Malignant neoplasm of breast (female), unspecified site 1970   S/P Rt radical mastectomy  . NSTEMI (non-ST elevated myocardial infarction) ([place]) 06/28/2012  . Osteoarthrosis, unspecified whether generalized or localized, unspecified site 2013  . Pacemaker   . Personal history of colonic polyps     s/p colonoscopy/polypectomy 2003  . Pure hyperglyceridemia 2004  . Senile osteoporosis 2003  . Stroke ([place]) 06/28/2012  . Tinnitus 2007  . Type II or unspecified type diabetes mellitus without mention of  complication, not stated as uncontrolled 2003  . Unspecified arthropathy, pelvic region and thigh 2010  . Unspecified glaucoma(365.9) 2013  . Unspecified hypothyroidism 2009  . Unspecified vitamin D deficiency 2009  . Urinary retention 07/07/2012   Past Surgical History:  Procedure Laterality Date  . BOTOX INJECTION N/A 10/03/2012   Procedure: BOTOX INJECTION;   Surgeon: [name], MD;  [facility] END[facility];  Service: Endoscopy;  Laterality: N/A;  . ESOPHAGOGASTRODUODENOSCOPY N/A 08/15/2012   Procedure: ESOPHAGOGASTRODUODENOSCOPY (EGD);  Surgeon: [name], MD;  Location: [facility] ENDOSCOPY;  Service: Endoscopy;  Laterality: N/A;  . ESOPHAGOGASTRODUODENOSCOPY N/A 08/15/2012   Procedure: ESOPHAGOGASTRODUODENOSCOPY (EGD);  Surgeon: [name], MD;  Location: [place];  Service: Endoscopy;  Laterality: N/A;  . ESOPHAGOGASTRODUODENOSCOPY N/A 10/03/2012   Procedure: ESOPHAGOGASTRODUODENOSCOPY (EGD);  Surgeon: [name], MD;  Location: [facility];  Service: Endoscopy;  Laterality: N/A;  . ESOPHAGOGASTRODUODENOSCOPY N/A 12/22/2012   Procedure: ESOPHAGOGASTRODUODENOSCOPY (EGD);  Surgeon: [name], MD;  Location: [facility] ENDOSCOPY;  Service: Endoscopy;  Laterality: N/A;  . ESOPHAGOSCOPY N/A 08/15/2012   Procedure: ESOPHAGOSCOPY;  Surgeon: [name], MD;  Location: [place];  Service: ENT;  Laterality: N/A;  . FOREIGN BODY REMOVAL ESOPHAGEAL N/A 08/15/2012   Procedure: REMOVAL FOREIGN BODY ESOPHAGEAL;  Surgeon: [name], MD;  Location: [place];  Service: ENT;  Laterality: N/A;  . INSERT / REPLACE / REMOVE PACEMAKER    . MASTECTOMY Right 1970   Cancer  . SAVORY DILATION N/A 10/03/2012   Procedure: SAVORY DILATION;  Surgeon: [name], MD;  [facility] ENDOS[facility];  Service: Endoscopy;  Laterality: N/A;    Allergies  Allergen Reactions  . Augmentin [Amoxicillin-Pot Clavulanate] Nausea And Vomiting  . Lactose Intolerance (Gi)   . Shingrix [Zoster Vac Recomb Adjuvanted] Rash    Outpatient Encounter Medications as of 03/26/2019  Medication Sig  . LORazepam (ATIVAN) 0.5 MG tablet Take 0.5 tablets (0.25 mg total) by mouth every 8 (eight) hours as needed for anxiety.  . [DISCONTINUED] LORazepam (ATIVAN) 0.5 MG tablet Take 0.25 mg by mouth every 8 (eight) hours as needed for anxiety.  Marland Kitchen acetaminophen (TYLENOL) 650 MG CR tablet Take 650 mg by mouth 2 (two) times  daily. For pain and every 4 hours as needed for up to 72 hours, document area of discomfort and effectiveness or medication  . carboxymethylcellulose (REFRESH PLUS) 0.5 % SOLN 2 drops 2 (two) times daily.   . Dentifrices (BIOTENE DRY MOUTH CARE DT) Take 1 spray by mouth 3 (three) times daily as needed.   Marland Kitchen escitalopram (LEXAPRO) 20 MG tablet Take 20 mg by mouth daily. For depression/ anxiety.  . furosemide (LASIX) 40 MG tablet Take 40 mg by mouth.  Marland Kitchen GLUCERNA (GLUCERNA) LIQD Take 1 Can by mouth daily. Divided 1/2 can with breakfast and 1/2 can at lunch  . levothyroxine (SYNTHROID, LEVOTHROID) 75 MCG tablet Take 75 mcg by mouth daily before breakfast. For thyroid therapy  . liver oil-zinc oxide (DESITIN) 40 % ointment Apply 1 application topically as needed for irritation (to buttocks and inner thighs).   . LORazepam (ATIVAN) 0.5 MG tablet Take 0.5 tablets (0.25 mg total) by mouth at bedtime.  Marland Kitchen losartan (COZAAR) 50 MG tablet Take 25 mg by mouth daily. In the morning  . metoprolol tartrate (LOPRESSOR) 25 MG tablet Take 25 mg by mouth 2 (two) times daily.  Marland Kitchen omeprazole (PRILOSEC) 20 MG capsule Take 20 mg by mouth daily.  . potassium chloride SA (K-DUR,KLOR-CON) 20 MEQ tablet  Take 20 mEq by mouth daily.  Addison Lank Hazel 14 % LIQD Apply topically as needed (for hemorrhoid flare up).   No facility-administered encounter medications on file as of 03/26/2019.    Review of Systems  Constitutional: Negative for activity change, appetite change, chills, diaphoresis, fatigue, fever and unexpected weight change.  HENT: Negative for congestion.   Respiratory: Negative for cough, shortness of breath and wheezing.   Cardiovascular: Positive for leg swelling. Negative for chest pain and palpitations.  Gastrointestinal: Negative for abdominal distention, abdominal pain, constipation and diarrhea.  Genitourinary: Negative for difficulty urinating and dysuria.  Musculoskeletal: Positive for gait problem. Negative  for arthralgias, back pain, joint swelling and myalgias.  Neurological: Positive for weakness. Negative for dizziness, tremors, seizures, syncope, facial asymmetry, speech difficulty, light-headedness, numbness and headaches.  Psychiatric/Behavioral: Positive for confusion. Negative for agitation, behavioral problems, sleep disturbance and suicidal ideas. The patient is nervous/anxious.     Immunization History  Administered Date(s) Administered  . Influenza Inj Mdck Quad Pf 12/22/2015  . Influenza Whole 12/17/2012  . Influenza, High Dose Seasonal PF 12/18/2018  . Influenza,inj,Quad PF,6+ Mos 12/24/2017  . Influenza-Unspecified 12/08/2013, 12/16/2014, 12/27/2016  . Moderna SARS-COVID-2 Vaccination 03/10/2019  . PPD Test 07/18/2012  . Pneumococcal Conjugate-13 08/27/2015   Pertinent  Health Maintenance Due  Topic Date Due  . FOOT EXAM  07/14/2019  . OPHTHALMOLOGY EXAM  09/22/2019  . INFLUENZA VACCINE  Completed  . PNA vac Low Risk Adult  Completed   Fall Risk  01/02/2019 07/14/2018 04/08/2018 07/10/2017 06/27/2016  Falls in the past year? 0 0 Exclusion - non ambulatory No No  Number falls in past yr: 0 0 - - -  Injury with Fall? 0 0 - - -  Risk for fall due to : Impaired balance/gait Impaired balance/gait - - -  Follow up Falls evaluation completed - - - -   Functional Status Survey:    Vitals:   03/27/19 0909  BP: 114/71  Pulse: 79  Resp: 18  Temp: 97.7 F (36.5 C)  SpO2: 93%   There is no height or weight on file to calculate BMI. Physical Exam Vitals and nursing note reviewed.  Constitutional:      General: She is not in acute distress.    Appearance: She is not diaphoretic.  HENT:     Head: Normocephalic and atraumatic.  Neck:     Vascular: No JVD.  Cardiovascular:     Rate and Rhythm: Normal rate and regular rhythm.     Heart sounds: No murmur.  Pulmonary:     Effort: Pulmonary effort is normal. No respiratory distress.     Breath sounds: Rales present. No  wheezing.  Abdominal:     General: Bowel sounds are normal. There is no distension.     Palpations: Abdomen is soft.  Musculoskeletal:     Right lower leg: Edema present.     Left lower leg: Edema present.  Skin:    General: Skin is warm and dry.  Neurological:     Mental Status: She is alert and oriented to person, place, and time. Mental status is at baseline.  Psychiatric:     Comments: flat     Labs reviewed: No results for input(s): NA, K, CL, CO2, GLUCOSE, BUN, CREATININE, CALCIUM, MG, PHOS in the last 8760 hours. No results for input(s): AST, ALT, ALKPHOS, BILITOT, PROT, ALBUMIN in the last 8760 hours. No results for input(s): WBC, NEUTROABS, HGB, HCT, MCV, PLT in the last 8760 hours.  Lab Results  Component Value Date   TSH 1.32 10/07/2018   Lab Results  Component Value Date   HGBA1C 7.7 10/07/2018   Lab Results  Component Value Date   CHOL 135 09/19/2015   HDL 50 09/19/2015   LDLCALC 55 09/19/2015   TRIG 149 09/19/2015   CHOLHDL 4.1 07/01/2012    Significant Diagnostic Results in last 30 days:  No results found.  Assessment/Plan 1. COVID-19 virus infection Doing well respiratory wise. Seems to be very mild which may be due to the fact that she did receive the vaccine. Continue isolation for full 14 days.   2. Anxiety Due to social isolation and covid pos test result May use ativan 0.25 mg q 8 prn. Continue scheduled dose at bedtime. Hopefully this symptom will improve once she is off isolation.     Family/ staff Communication: discussed with the supervisor  Labs/tests ordered:  NA

## 2019-03-31 DIAGNOSIS — L84 Corns and callosities: Secondary | ICD-10-CM | POA: Diagnosis not present

## 2019-03-31 DIAGNOSIS — E1159 Type 2 diabetes mellitus with other circulatory complications: Secondary | ICD-10-CM | POA: Diagnosis not present

## 2019-03-31 DIAGNOSIS — L602 Onychogryphosis: Secondary | ICD-10-CM | POA: Diagnosis not present

## 2019-04-02 ENCOUNTER — Other Ambulatory Visit: Payer: Self-pay | Admitting: Adult Health

## 2019-04-02 MED ORDER — LORAZEPAM 0.5 MG PO TABS: 0.2500 mg | ORAL_TABLET | Freq: Every day | ORAL | 2 refills | Status: DC

## 2019-04-06 DEATH — deceased

## 2019-04-07 ENCOUNTER — Non-Acute Institutional Stay (SKILLED_NURSING_FACILITY): Payer: Medicare Other | Admitting: Internal Medicine

## 2019-04-07 ENCOUNTER — Encounter: Payer: Self-pay | Admitting: Internal Medicine

## 2019-04-07 DIAGNOSIS — S93401D Sprain of unspecified ligament of right ankle, subsequent encounter: Secondary | ICD-10-CM | POA: Diagnosis not present

## 2019-04-07 DIAGNOSIS — I5032 Chronic diastolic (congestive) heart failure: Secondary | ICD-10-CM

## 2019-04-07 DIAGNOSIS — F0150 Vascular dementia without behavioral disturbance: Secondary | ICD-10-CM | POA: Diagnosis not present

## 2019-04-07 DIAGNOSIS — E118 Type 2 diabetes mellitus with unspecified complications: Secondary | ICD-10-CM

## 2019-04-07 DIAGNOSIS — I69351 Hemiplegia and hemiparesis following cerebral infarction affecting right dominant side: Secondary | ICD-10-CM | POA: Diagnosis not present

## 2019-04-07 DIAGNOSIS — F339 Major depressive disorder, recurrent, unspecified: Secondary | ICD-10-CM | POA: Diagnosis not present

## 2019-04-07 NOTE — Progress Notes (Signed)
Patient ID: Rachel Cooke, female   DOB: 23-Dec-1919, 86 y.o.   MRN: [id number]  Location:  [facility] Room Number: [place] of Service:  SNF [phone]) Provider:   [name], DO  Patient Care Team: [name], DO as PCP - General (Geriatric Medicine) [facility] Gwenette Greet, MD as Attending Physician (Gastroenterology) Garvin Fila, MD as Consulting Physician (Neurology) Julianne Handler, NP as Nurse Practitioner (Hospice and Palliative Medicine) Luberta Mutter, MD as Consulting Physician (Ophthalmology)  Extended Emergency Contact Information Primary Emergency Contact: Rachel Cooke: [address]          [place], [place] [address] Rachel Cooke of [place] Phone: [phone] Mobile Phone: [phone] Relation: Daughter  Code Status:  DNR, no MOST in vynca Goals of care: Advanced Directive information Advanced Directives 04/07/2019  Does Patient Have a Medical Advance Directive? Yes  Type of Advance Directive Out of facility DNR (pink MOST or yellow form)  Does patient want to make changes to medical advance directive? No - Patient declined  Copy of [place] in Chart? -  Pre-existing out of facility DNR order (yellow form or pink MOST form) -   Chief Complaint  Patient presents with  . Medical Management of Chronic Issues     Routine follow up     HPI:  Pt is a 86 y.o. female seen today for medical management of chronic diseases.  Discussed with day nurse and evening CNA.  They note she's a little confused on days and does not know her way from her apt to the bistro for meals.  She's REALLY confused at night.  She's taking her meds as directed.    She has a left upper buttock pinhole-sized wound that's being covered with a polymem for protection.  She has a right LE blood blister.  She develops swelling by night in her legs.  She also wears the right ankle brace due to foot  drop and difficulty she was having with running over her foot in her manual wheelchair.    When seen, she mostly c/o loneliness and boredom.  I noted that her family had provided a book of things she could do in her room.  She denied pain, difficulty with her bowels, problems sleeping.  I had to remind her who I was part way through the visit after introducing myself when I came in.  She did seem to want to keep on talking and kept offering me a seat, but there were no such chairs.  Weight is down 2 lbs in the past month.  Past Medical History:  Diagnosis Date  . Abnormality of gait 2007  . Acquired cyst of kidney 2002  . Acute sinus infection 11/25/2012  . Allergic rhinitis due to pollen   . Altered mental status 11/28/2012  . Anxiety state, unspecified   . Atrioventricular block, complete ([place]) 2003   s/p PPM 2003, generator chnage 2011  . Cardiac pacemaker in situ 2003  . Coronary atherosclerosis of native coronary artery 2003   non obstructive by Cath 2003  . CVA (cerebral infarction) 06/28/2012   rt hemiparesis, dysphagia  . Dendritic keratitis 2012  . Dysuria   . Edema 2003  . Esophageal dysmotility 08/19/2012  . Food impaction of esophagus 08/15/2012  . HTN (hypertension) 06/28/2012  . Hypertension   . Macular degeneration (senile) of retina, unspecified 2012  . Malignant neoplasm of breast (female), unspecified site 37   S/P  Rt radical mastectomy  . NSTEMI (non-ST elevated myocardial infarction) ([place]) 06/28/2012  . Osteoarthrosis, unspecified whether generalized or localized, unspecified site 2013  . Pacemaker   . Personal history of colonic polyps     s/p colonoscopy/polypectomy 2003  . Pure hyperglyceridemia 2004  . Senile osteoporosis 2003  . Stroke ([place]) 06/28/2012  . Tinnitus 2007  . Type II or unspecified type diabetes mellitus without mention of complication, not stated as uncontrolled 2003  . Unspecified arthropathy, pelvic region and thigh 2010  . Unspecified  glaucoma(365.9) 2013  . Unspecified hypothyroidism 2009  . Unspecified vitamin D deficiency 2009  . Urinary retention 07/07/2012   Past Surgical History:  Procedure Laterality Date  . BOTOX INJECTION N/A 10/03/2012   Procedure: BOTOX INJECTION;  Surgeon: [name], MD;  Location: [facility] ENDOSCOPY;  Service: Endoscopy;  Laterality: N/A;  . ESOPHAGOGASTRODUODENOSCOPY N/A 08/15/2012   Procedure: ESOPHAGOGASTRODUODENOSCOPY (EGD);  Surgeon: [name], MD;  Location: [facility] ENDOSCOPY;  Service: Endoscopy;  Laterality: N/A;  . ESOPHAGOGASTRODUODENOSCOPY N/A 08/15/2012   Procedure: ESOPHAGOGASTRODUODENOSCOPY (EGD);  Surgeon: [name], MD;  Location: [place];  Service: Endoscopy;  Laterality: N/A;  . ESOPHAGOGASTRODUODENOSCOPY N/A 10/03/2012   Procedure: ESOPHAGOGASTRODUODENOSCOPY (EGD);  Surgeon: [name], MD;  Location: [facility];  Service: Endoscopy;  Laterality: N/A;  . ESOPHAGOGASTRODUODENOSCOPY N/A 12/22/2012   Procedure: ESOPHAGOGASTRODUODENOSCOPY (EGD);  Surgeon: [name], MD;  Location: [facility] ENDOSCOPY;  Service: Endoscopy;  Laterality: N/A;  . ESOPHAGOSCOPY N/A 08/15/2012   Procedure: ESOPHAGOSCOPY;  Surgeon: [name], MD;  Location: [place];  Service: ENT;  Laterality: N/A;  . FOREIGN BODY REMOVAL ESOPHAGEAL N/A 08/15/2012   Procedure: REMOVAL FOREIGN BODY ESOPHAGEAL;  Surgeon: [name], MD;  Location: [place];  Service: ENT;  Laterality: N/A;  . INSERT / REPLACE / REMOVE PACEMAKER    . MASTECTOMY Right 1970   Cancer  . SAVORY DILATION N/A 10/03/2012   Procedure: SAVORY DILATION;  Surgeon: [name], MD;  Location: [facility] ENDOSCOPY;  Service: Endoscopy;  Laterality: N/A;    Allergies  Allergen Reactions  . Augmentin [Amoxicillin-Pot Clavulanate] Nausea And Vomiting  . Lactose Intolerance (Gi)   . Shingrix [Zoster Vac Recomb Adjuvanted] Rash    Outpatient Encounter Medications as of 04/07/2019  Medication Sig  . acetaminophen (TYLENOL) 650 MG CR tablet Take 650 mg by mouth 2 (two)  times daily. For pain and every 4 hours as needed for up to 72 hours, document area of discomfort and effectiveness or medication  . albuterol (VENTOLIN HFA) 108 (90 Base) MCG/ACT inhaler Inhale 2 puffs into the lungs every 6 (six) hours as needed for wheezing or shortness of breath.  . carboxymethylcellulose (REFRESH PLUS) 0.5 % SOLN 2 drops 2 (two) times daily.   . Dentifrices (BIOTENE DRY MOUTH CARE DT) Take 1 spray by mouth 3 (three) times daily as needed.   Marland Kitchen escitalopram (LEXAPRO) 20 MG tablet Take 20 mg by mouth daily. For depression/ anxiety.  . furosemide (LASIX) 40 MG tablet Take 40 mg by mouth.  Marland Kitchen GLUCERNA (GLUCERNA) LIQD Take 1 Can by mouth daily. Divided 1/2 can with breakfast and 1/2 can at lunch  . liver oil-zinc oxide (DESITIN) 40 % ointment Apply 1 application topically as needed for irritation (to buttocks and inner thighs).   . loperamide (IMODIUM A-D) 2 MG tablet Take 2 mg by mouth 4 (four) times daily as needed for diarrhea or loose stools. 1 tablet after each loose stool. Do not exceed 4 caps  in 24 hours as needed  . LORazepam (ATIVAN) 0.5 MG tablet Take 0.5 tablets (0.25 mg total) by mouth every 8 (eight) hours as needed for anxiety.  Marland Kitchen LORazepam (ATIVAN) 0.5 MG tablet Take 0.5 tablets (0.25 mg total) by mouth at bedtime.  . metoprolol tartrate (LOPRESSOR) 25 MG tablet Take 25 mg by mouth 2 (two) times daily.  Marland Kitchen omeprazole (PRILOSEC) 20 MG capsule Take 20 mg by mouth daily.  . potassium chloride SA (K-DUR,KLOR-CON) 20 MEQ tablet Take 20 mEq by mouth daily.  Addison Lank Hazel 14 % LIQD Apply topically as needed (for hemorrhoid flare up).  . [DISCONTINUED] DULoxetine (CYMBALTA) 60 MG capsule Take 60 mg by mouth daily.  . [DISCONTINUED] guaiFENesin (ROBITUSSIN) 100 MG/5ML liquid Take 100 mg by mouth. 6 hours PRN for cough  . [DISCONTINUED] levothyroxine (SYNTHROID, LEVOTHROID) 75 MCG tablet Take 75 mcg by mouth daily before breakfast. For thyroid therapy  . [DISCONTINUED] losartan  (COZAAR) 50 MG tablet Take 25 mg by mouth daily. In the morning   No facility-administered encounter medications on file as of 04/07/2019.    Review of Systems  Constitutional: Positive for activity change and fatigue. Negative for appetite change, chills, fever and unexpected weight change.  HENT: Negative for congestion.   Eyes: Negative for visual disturbance.  Respiratory: Negative for chest tightness and shortness of breath.   Gastrointestinal: Negative for abdominal pain, constipation, diarrhea, nausea and vomiting.  Genitourinary: Negative for dysuria.  Musculoskeletal: Positive for gait problem. Negative for arthralgias and back pain.       Sitting in her recliner; has power chair  Skin: Negative for color change and pallor.  Neurological: Positive for weakness. Negative for dizziness.       Right foot drop; right hemiparesis s/p stroke  Psychiatric/Behavioral: Positive for confusion. Negative for agitation, sleep disturbance and suicidal ideas. The patient is not nervous/anxious.        Depressed    Immunization History  Administered Date(s) Administered  . Influenza Inj Mdck Quad Pf 12/22/2015  . Influenza Whole 12/17/2012  . Influenza, High Dose Seasonal PF 12/18/2018  . Influenza,inj,Quad PF,6+ Mos 12/24/2017  . Influenza-Unspecified 12/08/2013, 12/16/2014, 12/27/2016  . Moderna SARS-COVID-2 Vaccination 03/10/2019  . PPD Test 07/18/2012  . Pneumococcal Conjugate-13 08/27/2015   Pertinent  Health Maintenance Due  Topic Date Due  . FOOT EXAM  07/14/2019  . OPHTHALMOLOGY EXAM  09/22/2019  . INFLUENZA VACCINE  Completed  . PNA vac Low Risk Adult  Completed   Fall Risk  01/02/2019 07/14/2018 04/08/2018 07/10/2017 06/27/2016  Falls in the past year? 0 0 Exclusion - non ambulatory No No  Number falls in past yr: 0 0 - - -  Injury with Fall? 0 0 - - -  Risk for fall due to : Impaired balance/gait Impaired balance/gait - - -  Follow up Falls evaluation completed - - - -    Functional Status Survey:    Vitals:   04/07/19 1421  BP: 134/74  Pulse: 84  Temp: 97.8 F (36.6 C)  SpO2: 94%  Weight: 169 lb (76.7 kg)  Height: 5\' 1"  (1.549 m)   Body mass index is 31.93 kg/m. Physical Exam Vitals and nursing note reviewed.  Constitutional:      General: She is not in acute distress.    Appearance: Normal appearance. She is obese. She is not toxic-appearing.  HENT:     Head: Normocephalic and atraumatic.     Right Ear: External ear normal.     Left  Ear: External ear normal.  Cardiovascular:     Rate and Rhythm: Normal rate and regular rhythm.     Pulses: Normal pulses.     Heart sounds: Normal heart sounds.  Pulmonary:     Effort: Pulmonary effort is normal.     Breath sounds: Normal breath sounds. No rales.  Abdominal:     General: Bowel sounds are normal. There is no distension.     Palpations: Abdomen is soft. There is no mass.     Tenderness: There is no abdominal tenderness. There is no guarding or rebound.  Musculoskeletal:     Right lower leg: Edema present.     Left lower leg: Edema present.  Skin:    General: Skin is warm and dry.     Capillary Refill: Capillary refill takes less than 2 seconds.     Comments: Pinpoint open area on left upper buttock covered with polymem; RLE blood blister  Neurological:     Mental Status: She is alert.     Comments: Right hemiparesis with foot drop  Psychiatric:        Mood and Affect: Mood normal.     Labs reviewed:  Lab Results  Component Value Date   TSH 1.32 10/07/2018   Lab Results  Component Value Date   HGBA1C 7.7 10/07/2018   Lab Results  Component Value Date   CHOL 135 09/19/2015   HDL 50 09/19/2015   LDLCALC 55 09/19/2015   TRIG 149 09/19/2015   CHOLHDL 4.1 07/01/2012   Assessment/Plan 1. Hemiparesis affecting right side as late effect of cerebrovascular accident [facility]) -with right foot drop and subsequent ankle sprain -now using ankle support brace to prevent recurrent  injury  2. Depression, recurrent ([place]) -ongoing--has been a lifelong issue per family -currently a bit worse with loneliness amid covid pandemic -continues on lexapro 20mg  daily and has ativan nightly for anxiety and then tid prn  3. Sprain of right ankle, unspecified ligament, subsequent encounter -continue ankle brace  4. Vascular dementia without behavioral disturbance ([place]) -progressing gradually with more difficulty getting around facility without cues and short-term memory loss over course of visit  5. Chronic diastolic congestive heart failure (HCC) -appears stable, continue lasix 40mg  daily and potassium 64meq daily -goals are comfort-based so avoiding frequent labs  6. Controlled type 2 diabetes mellitus with complication, without long-term current use of insulin ([place]) -not on meds for diabetes -has glucerna supplement shakes Lab Results  Component Value Date   HGBA1C 7.7 10/07/2018    Family/ staff Communication: discussed with snf nurse and CNA  Labs/tests ordered:  Ideally should have hba1c and bmp to see where she is with her potassium, renal function and diabetes since we're not checking cbgs at all   [place]. Daneisha Surges, D.O. [facility] [address],  [address] Cell Phone (Mon-Fri 8am-5pm):  [phone] On Call:  [phone] & follow prompts after 5pm & weekends Office Phone:  [phone] Office Fax:  [phone]

## 2019-04-14 ENCOUNTER — Encounter: Payer: Self-pay | Admitting: Internal Medicine

## 2019-05-04 ENCOUNTER — Non-Acute Institutional Stay (SKILLED_NURSING_FACILITY): Payer: Medicare Other | Admitting: Adult Health

## 2019-05-04 ENCOUNTER — Encounter: Payer: Self-pay | Admitting: Adult Health

## 2019-05-04 DIAGNOSIS — F4321 Adjustment disorder with depressed mood: Secondary | ICD-10-CM

## 2019-05-04 DIAGNOSIS — I872 Venous insufficiency (chronic) (peripheral): Secondary | ICD-10-CM

## 2019-05-04 DIAGNOSIS — F0150 Vascular dementia without behavioral disturbance: Secondary | ICD-10-CM

## 2019-05-04 DIAGNOSIS — L03115 Cellulitis of right lower limb: Secondary | ICD-10-CM | POA: Diagnosis not present

## 2019-05-04 DIAGNOSIS — E118 Type 2 diabetes mellitus with unspecified complications: Secondary | ICD-10-CM

## 2019-05-04 NOTE — Progress Notes (Signed)
Location:  Occupational psychologist of Service:  SNF (31) Provider:   Cindi Carbon, ANP [place] [phone]   [name], DO  Patient Care Team: [name], DO as PCP - General (Geriatric Medicine) Community, [name], Altamese Dilling, MD as Attending Physician (Gastroenterology) Garvin Fila, MD as Consulting Physician (Neurology) Julianne Handler, NP as Nurse Practitioner (Hospice and Palliative Medicine) Luberta Mutter, MD as Consulting Physician (Ophthalmology)  Extended Emergency Contact Information Primary Emergency Contact: Butters Address: [address]          [place], [place] [address] [name] of [place] Phone: [phone] Mobile Phone: [phone] Relation: Daughter  Code Status:  DNR Goals of care: Advanced Directive information Advanced Directives 04/07/2019  Does Patient Have a Medical Advance Directive? Yes  Type of Advance Directive Out of facility DNR (pink MOST or yellow form)  Does patient want to make changes to medical advance directive? No - Patient declined  Copy of [place] in Chart? -  Pre-existing out of facility DNR order (yellow form or pink MOST form) -     Chief Complaint  Patient presents with  . Medical Management of Chronic Issues    HPI:  Pt is a 86 y.o. female seen today for medical management of chronic diseases.    Right anterior shin wound: due to prior blood blister from hitting her leg has redness and swelling Non healing. Some eschar tissue that is improving. No fever is noted. Pt reports pain to the wound at times.   I spoke with her daughter [name] and she states that her mom is very negative and complains regularly. She is agitated and anxious at times and misses seeing her family. The social isolation seems to have worsened her short term memory loss. Her appetite is good. Sleeping well. No crying episodes.   She reports fatigue  during the day. Takes ativan at night for sleep.  DM II: diet controlled.  Lab Results  Component Value Date   HGBA1C 7.7 10/07/2018     Past Medical History:  Diagnosis Date  . Abnormality of gait 2007  . Acquired cyst of kidney 2002  . Acute sinus infection 11/25/2012  . Allergic rhinitis due to pollen   . Altered mental status 11/28/2012  . Anxiety state, unspecified   . Atrioventricular block, complete ([place]) 2003   s/p PPM 2003, generator chnage 2011  . Cardiac pacemaker in situ 2003  . Coronary atherosclerosis of native coronary artery 2003   non obstructive by Cath 2003  . CVA (cerebral infarction) 06/28/2012   rt hemiparesis, dysphagia  . Dendritic keratitis 2012  . Dysuria   . Edema 2003  . Esophageal dysmotility 08/19/2012  . Food impaction of esophagus 08/15/2012  . HTN (hypertension) 06/28/2012  . Hypertension   . Macular degeneration (senile) of retina, unspecified 2012  . Malignant neoplasm of breast (female), unspecified site 1970   S/P Rt radical mastectomy  . NSTEMI (non-ST elevated myocardial infarction) ([place]) 06/28/2012  . Osteoarthrosis, unspecified whether generalized or localized, unspecified site 2013  . Pacemaker   . Personal history of colonic polyps     s/p colonoscopy/polypectomy 2003  . Pure hyperglyceridemia 2004  . Senile osteoporosis 2003  . Stroke ([place]) 06/28/2012  . Tinnitus 2007  . Type II or unspecified type diabetes mellitus without mention of complication, not stated as uncontrolled 2003  . Unspecified arthropathy, pelvic region and thigh 2010  . Unspecified glaucoma(365.9)  2013  . Unspecified hypothyroidism 2009  . Unspecified vitamin D deficiency 2009  . Urinary retention 07/07/2012   Past Surgical History:  Procedure Laterality Date  . BOTOX INJECTION N/A 10/03/2012   Procedure: BOTOX INJECTION;  Surgeon: [name], MD;  Location: [facility] ENDOSCOPY;  Service: Endoscopy;  Laterality: N/A;  . ESOPHAGOGASTRODUODENOSCOPY N/A 08/15/2012    Procedure: ESOPHAGOGASTRODUODENOSCOPY (EGD);  Surgeon: [name], MD;  Location: [facility] ENDOSCOPY;  Service: Endoscopy;  Laterality: N/A;  . ESOPHAGOGASTRODUODENOSCOPY N/A 08/15/2012   Procedure: ESOPHAGOGASTRODUODENOSCOPY (EGD);  Surgeon: [name], MD;  Location: [place];  Service: Endoscopy;  Laterality: N/A;  . ESOPHAGOGASTRODUODENOSCOPY N/A 10/03/2012   Procedure: ESOPHAGOGASTRODUODENOSCOPY (EGD);  Surgeon: [name], MD;  Location: [facility];  Service: Endoscopy;  Laterality: N/A;  . ESOPHAGOGASTRODUODENOSCOPY N/A 12/22/2012   Procedure: ESOPHAGOGASTRODUODENOSCOPY (EGD);  Surgeon: [name], MD;  Location: [facility] ENDOSCOPY;  Service: Endoscopy;  Laterality: N/A;  . ESOPHAGOSCOPY N/A 08/15/2012   Procedure: ESOPHAGOSCOPY;  Surgeon: [name], MD;  Location: [place];  Service: ENT;  Laterality: N/A;  . FOREIGN BODY REMOVAL ESOPHAGEAL N/A 08/15/2012   Procedure: REMOVAL FOREIGN BODY ESOPHAGEAL;  Surgeon: [name], MD;  Location: [place];  Service: ENT;  Laterality: N/A;  . INSERT / REPLACE / REMOVE PACEMAKER    . MASTECTOMY Right 1970   Cancer  . SAVORY DILATION N/A 10/03/2012   Procedure: SAVORY DILATION;  Surgeon: [name], MD;  [facility];  Service: Endoscopy;  Laterality: N/A;    Allergies  Allergen Reactions  . Augmentin [Amoxicillin-Pot Clavulanate] Nausea And Vomiting  . Lactose Intolerance (Gi)   . Shingrix [Zoster Vac Recomb Adjuvanted] Rash    Outpatient Encounter Medications as of 05/04/2019  Medication Sig  . cephALEXin (KEFLEX) 500 MG capsule Take 500 mg by mouth 4 (four) times daily. X 7 days for right leg infection  . acetaminophen (TYLENOL) 650 MG CR tablet Take 650 mg by mouth 2 (two) times daily. For pain and every 4 hours as needed for up to 72 hours, document area of discomfort and effectiveness or medication  . albuterol (VENTOLIN HFA) 108 (90 Base) MCG/ACT inhaler Inhale 2 puffs into the lungs every 6 (six) hours as needed for wheezing or shortness  of breath.  . carboxymethylcellulose (REFRESH PLUS) 0.5 % SOLN 2 drops 2 (two) times daily.   . Dentifrices (BIOTENE DRY MOUTH CARE DT) Take 1 spray by mouth 3 (three) times daily as needed.   Marland Kitchen escitalopram (LEXAPRO) 20 MG tablet Take 20 mg by mouth daily. For depression/ anxiety.  . furosemide (LASIX) 40 MG tablet Take 40 mg by mouth.  Marland Kitchen GLUCERNA (GLUCERNA) LIQD Take 1 Can by mouth daily. Divided 1/2 can with breakfast and 1/2 can at lunch  . liver oil-zinc oxide (DESITIN) 40 % ointment Apply 1 application topically as needed for irritation (to buttocks and inner thighs).   . loperamide (IMODIUM A-D) 2 MG tablet Take 2 mg by mouth 4 (four) times daily as needed for diarrhea or loose stools. 1 tablet after each loose stool. Do not exceed 4 caps in 24 hours as needed  . LORazepam (ATIVAN) 0.5 MG tablet Take 0.5 tablets (0.25 mg total) by mouth at bedtime.  . metoprolol tartrate (LOPRESSOR) 25 MG tablet Take 25 mg by mouth 2 (two) times daily.  Marland Kitchen omeprazole (PRILOSEC) 20 MG capsule Take 20 mg by mouth daily.  . potassium chloride SA (K-DUR,KLOR-CON) 20 MEQ tablet Take 20 mEq by mouth daily.  Marland Kitchen  Witch Hazel 14 % LIQD Apply topically as needed (for hemorrhoid flare up).  . [DISCONTINUED] LORazepam (ATIVAN) 0.5 MG tablet Take 0.5 tablets (0.25 mg total) by mouth every 8 (eight) hours as needed for anxiety.   No facility-administered encounter medications on file as of 05/04/2019.    Review of Systems  Constitutional: Positive for fatigue. Negative for activity change, appetite change, chills, diaphoresis, fever and unexpected weight change.  HENT: Negative for congestion and trouble swallowing.   Respiratory: Negative for cough, shortness of breath and wheezing.   Cardiovascular: Positive for leg swelling. Negative for chest pain and palpitations.  Gastrointestinal: Negative for abdominal distention, abdominal pain, constipation and diarrhea.  Endocrine: Negative for polydipsia, polyphagia and  polyuria.  Genitourinary: Negative for difficulty urinating and dysuria.  Musculoskeletal: Positive for arthralgias and gait problem. Negative for back pain, joint swelling and myalgias.  Skin: Positive for wound.  Neurological: Positive for weakness (right sided chronic). Negative for dizziness, tremors, seizures, syncope, facial asymmetry, speech difficulty, light-headedness, numbness and headaches.  Psychiatric/Behavioral: Positive for confusion and dysphoric mood. Negative for agitation and behavioral problems.    Immunization History  Administered Date(s) Administered  . Influenza Inj Mdck Quad Pf 12/22/2015  . Influenza Whole 12/17/2012  . Influenza, High Dose Seasonal PF 12/18/2018  . Influenza,inj,Quad PF,6+ Mos 12/24/2017  . Influenza-Unspecified 12/08/2013, 12/16/2014, 12/27/2016  . Moderna SARS-COVID-2 Vaccination 03/10/2019, 04/07/2019  . PPD Test 07/18/2012  . Pneumococcal Conjugate-13 08/27/2015   Pertinent  Health Maintenance Due  Topic Date Due  . FOOT EXAM  07/14/2019  . OPHTHALMOLOGY EXAM  09/22/2019  . INFLUENZA VACCINE  Completed  . PNA vac Low Risk Adult  Completed   Fall Risk  01/02/2019 07/14/2018 04/08/2018 07/10/2017 06/27/2016  Falls in the past year? 0 0 Exclusion - non ambulatory No No  Number falls in past yr: 0 0 - - -  Injury with Fall? 0 0 - - -  Risk for fall due to : Impaired balance/gait Impaired balance/gait - - -  Follow up Falls evaluation completed - - - -   Functional Status Survey:    There were no vitals filed for this visit. There is no height or weight on file to calculate BMI. Physical Exam Vitals and nursing note reviewed.  Constitutional:      General: She is not in acute distress.    Appearance: She is not diaphoretic.  HENT:     Head: Normocephalic and atraumatic.  Neck:     Vascular: No JVD.  Cardiovascular:     Rate and Rhythm: Normal rate and regular rhythm.     Heart sounds: No murmur.  Pulmonary:     Effort: Pulmonary  effort is normal. No respiratory distress.     Breath sounds: Normal breath sounds. No wheezing.  Abdominal:     General: Bowel sounds are normal. There is no distension.     Palpations: Abdomen is soft.  Musculoskeletal:     Cervical back: No rigidity or tenderness.     Right lower leg: Edema (+2) present.     Left lower leg: Edema (+1) present.  Lymphadenopathy:     Cervical: No cervical adenopathy.  Skin:    General: Skin is warm and dry.     Comments: Right anterior shin wound 50 % pink 50% black, Surrounding erythema noted. RLL with mild erythema and warmth. No tenderness on palpation to the wound or calf. No purulent drainage.   Neurological:     Mental Status: She is alert  and oriented to person, place, and time.     Comments: Right sided weakness chronic  Psychiatric:     Comments: flat     Labs reviewed: No results for input(s): NA, K, CL, CO2, GLUCOSE, BUN, CREATININE, CALCIUM, MG, PHOS in the last 8760 hours. No results for input(s): AST, ALT, ALKPHOS, BILITOT, PROT, ALBUMIN in the last 8760 hours. No results for input(s): WBC, NEUTROABS, HGB, HCT, MCV, PLT in the last 8760 hours. Lab Results  Component Value Date   TSH 1.32 10/07/2018   Lab Results  Component Value Date   HGBA1C 7.7 10/07/2018   Lab Results  Component Value Date   CHOL 135 09/19/2015   HDL 50 09/19/2015   LDLCALC 55 09/19/2015   TRIG 149 09/19/2015   CHOLHDL 4.1 07/01/2012    Significant Diagnostic Results in last 30 days:  No results found.  Assessment/Plan 1. Cellulitis of right lower extremity Keflex 500 mg QID x 7 days  2. Controlled type 2 diabetes mellitus with complication, without long-term current use of insulin ([place]) Lab Results  Component Value Date   HGBA1C 7.7 10/07/2018   Diet controlled. Recheck 3/4 due to non healing wound and diabetic monitoring.   3. Chronic venous insufficiency Continue compression hose on in the am and off in the pm  4. Situational  depression Continues with periods of fatigue and frustration regarding social isolation/panemdic. Would not taper Lexapro. Hopefully this will improve the easing of restrictions in the facility   5. Vascular dementia without behavioral disturbance (HCC) Progressive short term memory loss. Off namenda due to lack of benefit. Continue supportive care.     Family/ staff Communication: resident   Labs/tests ordered:  A1C

## 2019-05-05 ENCOUNTER — Encounter: Payer: Medicare Other | Admitting: Internal Medicine

## 2019-05-05 ENCOUNTER — Other Ambulatory Visit: Payer: Self-pay

## 2019-05-07 DIAGNOSIS — E1100 Type 2 diabetes mellitus with hyperosmolarity without nonketotic hyperglycemic-hyperosmolar coma (NKHHC): Secondary | ICD-10-CM | POA: Diagnosis not present

## 2019-05-07 LAB — HEMOGLOBIN A1C: Hemoglobin A1C: 7.4

## 2019-06-08 ENCOUNTER — Encounter: Payer: Self-pay | Admitting: Adult Health

## 2019-06-08 ENCOUNTER — Non-Acute Institutional Stay (SKILLED_NURSING_FACILITY): Payer: Medicare Other | Admitting: Adult Health

## 2019-06-08 DIAGNOSIS — I5032 Chronic diastolic (congestive) heart failure: Secondary | ICD-10-CM | POA: Diagnosis not present

## 2019-06-08 DIAGNOSIS — E118 Type 2 diabetes mellitus with unspecified complications: Secondary | ICD-10-CM

## 2019-06-08 DIAGNOSIS — K224 Dyskinesia of esophagus: Secondary | ICD-10-CM | POA: Diagnosis not present

## 2019-06-08 DIAGNOSIS — E039 Hypothyroidism, unspecified: Secondary | ICD-10-CM

## 2019-06-08 DIAGNOSIS — F0150 Vascular dementia without behavioral disturbance: Secondary | ICD-10-CM

## 2019-06-08 DIAGNOSIS — I69351 Hemiplegia and hemiparesis following cerebral infarction affecting right dominant side: Secondary | ICD-10-CM | POA: Diagnosis not present

## 2019-06-08 DIAGNOSIS — I890 Lymphedema, not elsewhere classified: Secondary | ICD-10-CM

## 2019-06-08 NOTE — Progress Notes (Signed)
Location:  Occupational psychologist of Service:  SNF (31) Provider:  Cindi Carbon, ANP [place] [phone]   [name], DO  Patient Care Team: [name], DO as PCP - General (Geriatric Medicine) Community, [facility] [name], Altamese Dilling, MD as Attending Physician (Gastroenterology) Garvin Fila, MD as Consulting Physician (Neurology) Julianne Handler, NP as Nurse Practitioner (Hospice and Palliative Medicine) Luberta Mutter, MD as Consulting Physician (Ophthalmology)  Extended Emergency Contact Information Primary Emergency Contact: Phelan Address: [address]          [place], [place] [address] [name] of [place] Phone: [phone] Mobile Phone: [phone] Relation: Daughter  Code Status:  DNR Goals of care: Advanced Directive information Advanced Directives 04/07/2019  Does Patient Have a Medical Advance Directive? Yes  Type of Advance Directive Out of facility DNR (pink MOST or yellow form)  Does patient want to make changes to medical advance directive? No - Patient declined  Copy of [place] in Chart? -  Pre-existing out of facility DNR order (yellow form or pink MOST form) -     Chief Complaint  Patient presents with  . Medical Management of Chronic Issues    HPI:  Pt is a 86 y.o. female seen today for medical management of chronic diseases.    She has a hx of vascular dementia and prior CVA with right sided weakness. She has progressive memory loss but is still able to operate her motorized wheelchair, communicate, and feed herself.   There are no reported issues with chewing or swallowing. She is tolerating a D1 diet well but does have some solid food at times as she has signed a waiver for QOL.  Her weight is stable and there are no issues with sob, pnd, doe, or increased edema.   No issues with the chronic lymphedema in her arm.    DM II is controlled with  diet.   She was treated for a wound on her right leg with keflex and it has healed  Past Medical History:  Diagnosis Date  . Abnormality of gait 2007  . Acquired cyst of kidney 2002  . Acute sinus infection 11/25/2012  . Allergic rhinitis due to pollen   . Altered mental status 11/28/2012  . Anxiety state, unspecified   . Atrioventricular block, complete ([place]) 2003   s/p PPM 2003, generator chnage 2011  . Cardiac pacemaker in situ 2003  . Coronary atherosclerosis of native coronary artery 2003   non obstructive by Cath 2003  . CVA (cerebral infarction) 06/28/2012   rt hemiparesis, dysphagia  . Dendritic keratitis 2012  . Dysuria   . Edema 2003  . Esophageal dysmotility 08/19/2012  . Food impaction of esophagus 08/15/2012  . HTN (hypertension) 06/28/2012  . Hypertension   . Macular degeneration (senile) of retina, unspecified 2012  . Malignant neoplasm of breast (female), unspecified site 1970   S/P Rt radical mastectomy  . NSTEMI (non-ST elevated myocardial infarction) ([place]) 06/28/2012  . Osteoarthrosis, unspecified whether generalized or localized, unspecified site 2013  . Pacemaker   . Personal history of colonic polyps     s/p colonoscopy/polypectomy 2003  . Pure hyperglyceridemia 2004  . Senile osteoporosis 2003  . Stroke ([place]) 06/28/2012  . Tinnitus 2007  . Type II or unspecified type diabetes mellitus without mention of complication, not stated as uncontrolled 2003  . Unspecified arthropathy, pelvic region and thigh 2010  . Unspecified glaucoma(365.9) 2013  . Unspecified  hypothyroidism 2009  . Unspecified vitamin D deficiency 2009  . Urinary retention 07/07/2012   Past Surgical History:  Procedure Laterality Date  . BOTOX INJECTION N/A 10/03/2012   Procedure: BOTOX INJECTION;  Surgeon: [name], MD;  [facility] ENDOS[facility];  Service: Endoscopy;  Laterality: N/A;  . ESOPHAGOGASTRODUODENOSCOPY N/A 08/15/2012   Procedure: ESOPHAGOGASTRODUODENOSCOPY (EGD);  Surgeon: [name], MD;  Location: [facility] ENDOSCOPY;  Service: Endoscopy;  Laterality: N/A;  . ESOPHAGOGASTRODUODENOSCOPY N/A 08/15/2012   Procedure: ESOPHAGOGASTRODUODENOSCOPY (EGD);  Surgeon: [name], MD;  Location: [place];  Service: Endoscopy;  Laterality: N/A;  . ESOPHAGOGASTRODUODENOSCOPY N/A 10/03/2012   Procedure: ESOPHAGOGASTRODUODENOSCOPY (EGD);  Surgeon: [name], MD;  Location: [facility];  Service: Endoscopy;  Laterality: N/A;  . ESOPHAGOGASTRODUODENOSCOPY N/A 12/22/2012   Procedure: ESOPHAGOGASTRODUODENOSCOPY (EGD);  Surgeon: [name], MD;  Location[facility]COPY;  Service: Endoscopy;  Laterality: N/A;  . ESOPHAGOSCOPY N/A 08/15/2012   Procedure: ESOPHAGOSCOPY;  Surgeon: [name], MD;  Location: [place];  Service: ENT;  Laterality: N/A;  . FOREIGN BODY REMOVAL ESOPHAGEAL N/A 08/15/2012   Procedure: REMOVAL FOREIGN BODY ESOPHAGEAL;  Surgeon: [name], MD;  Location: [place];  Service: ENT;  Laterality: N/A;  . INSERT / REPLACE / REMOVE PACEMAKER    . MASTECTOMY Right 1970   Cancer  . SAVORY DILATION N/A 10/03/2012   Procedure: SAVORY DILATION;  Surgeon: [name], MD;  [facility];  Service: Endoscopy;  Laterality: N/A;    Allergies  Allergen Reactions  . Augmentin [Amoxicillin-Pot Clavulanate] Nausea And Vomiting  . Lactose Intolerance (Gi)   . Shingrix [Zoster Vac Recomb Adjuvanted] Rash    Outpatient Encounter Medications as of 06/08/2019  Medication Sig  . levothyroxine (SYNTHROID) 75 MCG tablet Take 75 mcg by mouth daily before breakfast.  . losartan (COZAAR) 25 MG tablet Take 25 mg by mouth daily.  Marland Kitchen acetaminophen (TYLENOL) 650 MG CR tablet Take 650 mg by mouth 2 (two) times daily. For pain and every 4 hours as needed for up to 72 hours, document area of discomfort and effectiveness or medication  . albuterol (VENTOLIN HFA) 108 (90 Base) MCG/ACT inhaler Inhale 2 puffs into the lungs every 6 (six) hours as needed for wheezing or shortness of breath.  .  carboxymethylcellulose (REFRESH PLUS) 0.5 % SOLN 2 drops 2 (two) times daily.   . Dentifrices (BIOTENE DRY MOUTH CARE DT) Take 1 spray by mouth 3 (three) times daily as needed.   Marland Kitchen escitalopram (LEXAPRO) 20 MG tablet Take 20 mg by mouth daily. For depression/ anxiety.  . furosemide (LASIX) 40 MG tablet Take 40 mg by mouth.  Marland Kitchen GLUCERNA (GLUCERNA) LIQD Take 1 Can by mouth daily. Divided 1/2 can with breakfast and 1/2 can at lunch  . liver oil-zinc oxide (DESITIN) 40 % ointment Apply 1 application topically as needed for irritation (to buttocks and inner thighs).   . loperamide (IMODIUM A-D) 2 MG tablet Take 2 mg by mouth 4 (four) times daily as needed for diarrhea or loose stools. 1 tablet after each loose stool. Do not exceed 4 caps in 24 hours as needed  . LORazepam (ATIVAN) 0.5 MG tablet Take 0.5 tablets (0.25 mg total) by mouth at bedtime.  . metoprolol tartrate (LOPRESSOR) 25 MG tablet Take 25 mg by mouth 2 (two) times daily.  Marland Kitchen omeprazole (PRILOSEC) 20 MG capsule Take 20 mg by mouth daily.  . potassium chloride SA (K-DUR,KLOR-CON) 20 MEQ tablet Take 20 mEq by mouth daily.  Marland Kitchen  Witch Hazel 14 % LIQD Apply topically as needed (for hemorrhoid flare up).  . [DISCONTINUED] cephALEXin (KEFLEX) 500 MG capsule Take 500 mg by mouth 4 (four) times daily. X 7 days for right leg infection   No facility-administered encounter medications on file as of 06/08/2019.    Review of Systems  Constitutional: Negative for activity change, appetite change, chills, diaphoresis, fatigue, fever and unexpected weight change.  HENT: Negative for congestion.   Respiratory: Negative for cough, shortness of breath and wheezing.   Cardiovascular: Positive for leg swelling. Negative for chest pain and palpitations.       Arm edema   Gastrointestinal: Negative for abdominal distention, abdominal pain, constipation and diarrhea.  Genitourinary: Negative for difficulty urinating and dysuria.  Musculoskeletal: Positive for gait  problem. Negative for arthralgias, back pain, joint swelling and myalgias.  Neurological: Positive for weakness. Negative for dizziness, tremors, seizures, syncope, facial asymmetry, speech difficulty, light-headedness, numbness and headaches.  Psychiatric/Behavioral: Positive for confusion. Negative for agitation and behavioral problems.    Immunization History  Administered Date(s) Administered  . Influenza Inj Mdck Quad Pf 12/22/2015  . Influenza Whole 12/17/2012  . Influenza, High Dose Seasonal PF 12/18/2018  . Influenza,inj,Quad PF,6+ Mos 12/24/2017  . Influenza-Unspecified 12/08/2013, 12/16/2014, 12/27/2016  . Moderna SARS-COVID-2 Vaccination 03/10/2019, 04/07/2019  . PPD Test 07/18/2012  . Pneumococcal Conjugate-13 08/27/2015   Pertinent  Health Maintenance Due  Topic Date Due  . FOOT EXAM  07/14/2019  . OPHTHALMOLOGY EXAM  09/22/2019  . INFLUENZA VACCINE  10/04/2019  . PNA vac Low Risk Adult  Completed   Fall Risk  01/02/2019 07/14/2018 04/08/2018 07/10/2017 06/27/2016  Falls in the past year? 0 0 Exclusion - non ambulatory No No  Number falls in past yr: 0 0 - - -  Injury with Fall? 0 0 - - -  Risk for fall due to : Impaired balance/gait Impaired balance/gait - - -  Follow up Falls evaluation completed - - - -   Functional Status Survey:    Vitals:   06/08/19 1552  Weight: 171 lb 9.6 oz (77.8 kg)   Body mass index is 32.42 kg/m. Physical Exam Vitals and nursing note reviewed.  Constitutional:      General: She is not in acute distress.    Appearance: She is not diaphoretic.  HENT:     Head: Normocephalic and atraumatic.     Mouth/Throat:     Mouth: Mucous membranes are moist.     Pharynx: Oropharynx is clear.  Neck:     Vascular: No JVD.  Cardiovascular:     Rate and Rhythm: Normal rate and regular rhythm.     Heart sounds: No murmur.  Pulmonary:     Effort: Pulmonary effort is normal. No respiratory distress.     Breath sounds: Normal breath sounds. No  wheezing.  Abdominal:     General: Bowel sounds are normal. There is no distension.     Palpations: Abdomen is soft.  Musculoskeletal:        General: Swelling (chronic to RUE not pitting) present.     Right lower leg: Edema (+1) present.     Left lower leg: Edema (+1) present.  Skin:    General: Skin is warm and dry.  Neurological:     Mental Status: She is alert and oriented to person, place, and time.     Comments: Right sided weakness  Psychiatric:        Mood and Affect: Mood normal.  Labs reviewed: No results for input(s): NA, K, CL, CO2, GLUCOSE, BUN, CREATININE, CALCIUM, MG, PHOS in the last 8760 hours. No results for input(s): AST, ALT, ALKPHOS, BILITOT, PROT, ALBUMIN in the last 8760 hours. No results for input(s): WBC, NEUTROABS, HGB, HCT, MCV, PLT in the last 8760 hours. Lab Results  Component Value Date   TSH 1.32 10/07/2018   Lab Results  Component Value Date   HGBA1C 7.4 05/07/2019   Lab Results  Component Value Date   CHOL 135 09/19/2015   HDL 50 09/19/2015   LDLCALC 55 09/19/2015   TRIG 149 09/19/2015   CHOLHDL 4.1 07/01/2012    Significant Diagnostic Results in last 30 days:  No results found.  Assessment/Plan 1. Esophageal dysmotility No new issues. Continue D1 diet. Asp prec in place. Should sit up for meals at least 30 min after consuming.   2. Chronic diastolic congestive heart failure (HCC) Compensated Continue Lasix 40 mg qd and Kdur 20 meq qd   3. Vascular dementia without behavioral disturbance (HCC) Progressive short term memory loss noted.  Supportive care only.  Comfort care goals only  4. Hemiparesis affecting right side as late effect of cerebrovascular accident [facility]) Right sided due to prior CVA  5. Lymphedema of arm Due to prior mastectomy due to breast ca. Does no want to wear compression sleeve.   6. Acquired hypothyroidism Lab Results  Component Value Date   TSH 1.32 10/07/2018   Continue Synthroid 75 mcg qd    7. DM II Diet controlled . Lab Results  Component Value Date   HGBA1C 7.4 05/07/2019       Family/ staff Communication: resident   Labs/tests ordered:  NA

## 2019-06-09 ENCOUNTER — Other Ambulatory Visit: Payer: Self-pay

## 2019-06-09 ENCOUNTER — Encounter: Payer: Medicare Other | Admitting: Internal Medicine

## 2019-06-12 ENCOUNTER — Encounter: Payer: Medicare Other | Admitting: Internal Medicine

## 2019-06-16 DIAGNOSIS — E1159 Type 2 diabetes mellitus with other circulatory complications: Secondary | ICD-10-CM | POA: Diagnosis not present

## 2019-06-16 DIAGNOSIS — L84 Corns and callosities: Secondary | ICD-10-CM | POA: Diagnosis not present

## 2019-06-16 DIAGNOSIS — L602 Onychogryphosis: Secondary | ICD-10-CM | POA: Diagnosis not present

## 2019-06-29 ENCOUNTER — Non-Acute Institutional Stay (SKILLED_NURSING_FACILITY): Payer: Medicare Other | Admitting: Adult Health

## 2019-06-29 ENCOUNTER — Encounter: Payer: Self-pay | Admitting: Adult Health

## 2019-06-29 DIAGNOSIS — H5202 Hypermetropia, left eye: Secondary | ICD-10-CM | POA: Diagnosis not present

## 2019-06-29 DIAGNOSIS — R410 Disorientation, unspecified: Secondary | ICD-10-CM | POA: Diagnosis not present

## 2019-06-29 DIAGNOSIS — R300 Dysuria: Secondary | ICD-10-CM | POA: Diagnosis not present

## 2019-06-29 DIAGNOSIS — H353133 Nonexudative age-related macular degeneration, bilateral, advanced atrophic without subfoveal involvement: Secondary | ICD-10-CM | POA: Diagnosis not present

## 2019-06-29 DIAGNOSIS — Z961 Presence of intraocular lens: Secondary | ICD-10-CM | POA: Diagnosis not present

## 2019-06-29 DIAGNOSIS — R1031 Right lower quadrant pain: Secondary | ICD-10-CM

## 2019-06-29 NOTE — Progress Notes (Signed)
Location:  Occupational psychologist of Service:  SNF (31) Provider:   Cindi Carbon, ANP [place] [phone]   [name], DO  Patient Care Team: [name], DO as PCP - General (Geriatric Medicine) [name], Altamese Dilling, MD as Attending Physician (Gastroenterology) Garvin Fila, MD as Consulting Physician (Neurology) Julianne Handler, NP as Nurse Practitioner (Hospice and Palliative Medicine) Luberta Mutter, MD as Consulting Physician (Ophthalmology)  Extended Emergency Contact Information Primary Emergency Contact: Rhodell Address: [address]          [place], [place] [address] [name] of [place] Phone: [phone] Mobile Phone: [phone] Relation: Daughter  Code Status:  DNR Goals of care: Advanced Directive information Advanced Directives 04/07/2019  Does Patient Have a Medical Advance Directive? Yes  Type of Advance Directive Out of facility DNR (pink MOST or yellow form)  Does patient want to make changes to medical advance directive? No - Patient declined  Copy of [place] in Chart? -  Pre-existing out of facility DNR order (yellow form or pink MOST form) -     Chief Complaint  Patient presents with  . Acute Visit    confusion, pain with urination, groin pain    HPI:  Pt is a 86 y.o. female seen today for an acute visit for confusion, pain with urination, and groin pain on the right. These symptoms have been present for 2-3 days. She has not had a fever. She has underling vascular dementia but is acutely more confused and having difficulty operating her scooter. The groin pain comes and goes and is not associated with urinating. No bladder or back pain is present. She is eating and drinking well. Urinating regularly, PVR 75 cc. LBM 4/23.  Vitals are WNL. Her goals of care are comfort based with only treatment of easily reversible issues at [facility] if  they are causing pain or discomfort.  She has been on lasix 40 mg qd for CHF. No issues with sob, doe, pnd, edema or weight gain. No recent labs due to goals of care. Resident does report dry mouth.   Wt Readings from Last 3 Encounters:  06/29/19 171 lb 3.2 oz (77.7 kg)  06/08/19 171 lb 9.6 oz (77.8 kg)  04/07/19 169 lb (76.7 kg)    She is vaccinated for covid.   Past Medical History:  Diagnosis Date  . Abnormality of gait 2007  . Acquired cyst of kidney 2002  . Acute sinus infection 11/25/2012  . Allergic rhinitis due to pollen   . Altered mental status 11/28/2012  . Anxiety state, unspecified   . Atrioventricular block, complete ([place]) 2003   s/p PPM 2003, generator chnage 2011  . Cardiac pacemaker in situ 2003  . Coronary atherosclerosis of native coronary artery 2003   non obstructive by Cath 2003  . CVA (cerebral infarction) 06/28/2012   rt hemiparesis, dysphagia  . Dendritic keratitis 2012  . Dysuria   . Edema 2003  . Esophageal dysmotility 08/19/2012  . Food impaction of esophagus 08/15/2012  . HTN (hypertension) 06/28/2012  . Hypertension   . Macular degeneration (senile) of retina, unspecified 2012  . Malignant neoplasm of breast (female), unspecified site 1970   S/P Rt radical mastectomy  . NSTEMI (non-ST elevated myocardial infarction) ([place]) 06/28/2012  . Osteoarthrosis, unspecified whether generalized or localized, unspecified site 2013  . Pacemaker   . Personal history of colonic polyps     s/p colonoscopy/polypectomy 2003  .  Pure hyperglyceridemia 2004  . Senile osteoporosis 2003  . Stroke ([place]) 06/28/2012  . Tinnitus 2007  . Type II or unspecified type diabetes mellitus without mention of complication, not stated as uncontrolled 2003  . Unspecified arthropathy, pelvic region and thigh 2010  . Unspecified glaucoma(365.9) 2013  . Unspecified hypothyroidism 2009  . Unspecified vitamin D deficiency 2009  . Urinary retention 07/07/2012   Past Surgical History:    Procedure Laterality Date  . BOTOX INJECTION N/A 10/03/2012   Procedure: BOTOX INJECTION;  Surgeon: [name], MD;  Location: [facility] ENDOSCOPY;  Service: Endoscopy;  Laterality: N/A;  . ESOPHAGOGASTRODUODENOSCOPY N/A 08/15/2012   Procedure: ESOPHAGOGASTRODUODENOSCOPY (EGD);  Surgeon: [name], MD;  Location: [facility] ENDOSCOPY;  Service: Endoscopy;  Laterality: N/A;  . ESOPHAGOGASTRODUODENOSCOPY N/A 08/15/2012   Procedure: ESOPHAGOGASTRODUODENOSCOPY (EGD);  Surgeon: [name], MD;  Location: [place];  Service: Endoscopy;  Laterality: N/A;  . ESOPHAGOGASTRODUODENOSCOPY N/A 10/03/2012   Procedure: ESOPHAGOGASTRODUODENOSCOPY (EGD);  Surgeon: [name], MD;  Location: [facility];  Service: Endoscopy;  Laterality: N/A;  . ESOPHAGOGASTRODUODENOSCOPY N/A 12/22/2012   Procedure: ESOPHAGOGASTRODUODENOSCOPY (EGD);  Surgeon: [name], MD;  Location[facility] END[facility]COPY;  Service: Endoscopy;  Laterality: N/A;  . ESOPHAGOSCOPY N/A 08/15/2012   Procedure: ESOPHAGOSCOPY;  Surgeon: [name], MD;  Location: [place];  Service: ENT;  Laterality: N/A;  . FOREIGN BODY REMOVAL ESOPHAGEAL N/A 08/15/2012   Procedure: REMOVAL FOREIGN BODY ESOPHAGEAL;  Surgeon: [name], MD;  Location: [place];  Service: ENT;  Laterality: N/A;  . INSERT / REPLACE / REMOVE PACEMAKER    . MASTECTOMY Right 1970   Cancer  . SAVORY DILATION N/A 10/03/2012   Procedure: SAVORY DILATION;  Surgeon: [name], MD;  Location: [facility] ENDOSCOPY;  Service: Endoscopy;  Laterality: N/A;    Allergies  Allergen Reactions  . Augmentin [Amoxicillin-Pot Clavulanate] Nausea And Vomiting  . Lactose Intolerance (Gi)   . Shingrix [Zoster Vac Recomb Adjuvanted] Rash    Outpatient Encounter Medications as of 06/29/2019  Medication Sig  . acetaminophen (TYLENOL) 650 MG CR tablet Take 650 mg by mouth 2 (two) times daily. For pain and every 4 hours as needed for up to 72 hours, document area of discomfort and effectiveness or medication  . albuterol (VENTOLIN HFA)  108 (90 Base) MCG/ACT inhaler Inhale 2 puffs into the lungs every 6 (six) hours as needed for wheezing or shortness of breath.  . carboxymethylcellulose (REFRESH PLUS) 0.5 % SOLN 2 drops 2 (two) times daily.   . Dentifrices (BIOTENE DRY MOUTH CARE DT) Take 1 spray by mouth 3 (three) times daily as needed.   Marland Kitchen escitalopram (LEXAPRO) 20 MG tablet Take 20 mg by mouth daily. For depression/ anxiety.  . furosemide (LASIX) 40 MG tablet Take 40 mg by mouth daily.   Marland Kitchen GLUCERNA (GLUCERNA) LIQD Take 1 Can by mouth daily. Divided 1/2 can with breakfast and 1/2 can at lunch  . levothyroxine (SYNTHROID) 75 MCG tablet Take 75 mcg by mouth daily before breakfast.  . liver oil-zinc oxide (DESITIN) 40 % ointment Apply 1 application topically as needed for irritation (to buttocks and inner thighs).   . loperamide (IMODIUM A-D) 2 MG tablet Take 2 mg by mouth 4 (four) times daily as needed for diarrhea or loose stools. 1 tablet after each loose stool. Do not exceed 4 caps in 24 hours as needed  . LORazepam (ATIVAN) 0.5 MG tablet Take 0.5 tablets (0.25 mg total) by mouth at bedtime.  Marland Kitchen losartan (  COZAAR) 25 MG tablet Take 25 mg by mouth daily.  . metoprolol tartrate (LOPRESSOR) 25 MG tablet Take 25 mg by mouth 2 (two) times daily.  Marland Kitchen omeprazole (PRILOSEC) 20 MG capsule Take 20 mg by mouth daily.  . potassium chloride SA (K-DUR,KLOR-CON) 20 MEQ tablet Take 20 mEq by mouth daily.  Addison Lank Hazel 14 % LIQD Apply topically as needed (for hemorrhoid flare up).   No facility-administered encounter medications on file as of 06/29/2019.    Review of Systems  Constitutional: Positive for activity change and fatigue. Negative for appetite change, chills, diaphoresis, fever and unexpected weight change.  HENT: Positive for hearing loss and trouble swallowing (chronic). Negative for congestion, postnasal drip, rhinorrhea, sinus pressure, sinus pain and sore throat.   Respiratory: Negative for cough, shortness of breath and  wheezing.   Cardiovascular: Positive for leg swelling. Negative for chest pain and palpitations.  Gastrointestinal: Negative for abdominal distention, abdominal pain, constipation, diarrhea, nausea, rectal pain and vomiting.  Endocrine: Negative for polyphagia and polyuria.  Genitourinary: Positive for dysuria and frequency. Negative for decreased urine volume, difficulty urinating, flank pain, hematuria, pelvic pain, urgency, vaginal bleeding and vaginal discharge.  Musculoskeletal: Positive for gait problem. Negative for arthralgias, back pain, joint swelling and myalgias.  Skin: Negative for rash and wound.  Neurological: Positive for weakness. Negative for dizziness, tremors, seizures, syncope, facial asymmetry, speech difficulty, light-headedness, numbness and headaches.  Psychiatric/Behavioral: Positive for confusion. Negative for agitation, behavioral problems and sleep disturbance.    Immunization History  Administered Date(s) Administered  . Influenza Inj Mdck Quad Pf 12/22/2015  . Influenza Whole 12/17/2012  . Influenza, High Dose Seasonal PF 12/18/2018  . Influenza,inj,Quad PF,6+ Mos 12/24/2017  . Influenza-Unspecified 12/08/2013, 12/16/2014, 12/27/2016  . Moderna SARS-COVID-2 Vaccination 03/10/2019, 04/07/2019  . PPD Test 07/18/2012  . Pneumococcal Conjugate-13 08/27/2015   Pertinent  Health Maintenance Due  Topic Date Due  . FOOT EXAM  07/14/2019  . OPHTHALMOLOGY EXAM  09/22/2019  . INFLUENZA VACCINE  10/04/2019  . PNA vac Low Risk Adult  Completed   Fall Risk  06/08/2019 01/02/2019 07/14/2018 04/08/2018 07/10/2017  Falls in the past year? 0 0 0 Exclusion - non ambulatory No  Number falls in past yr: 0 0 0 - -  Injury with Fall? 0 0 0 - -  Risk for fall due to : Impaired mobility;Impaired balance/gait Impaired balance/gait Impaired balance/gait - -  Follow up Falls evaluation completed Falls evaluation completed - - -   Functional Status Survey:    Vitals:   06/29/19  1435  BP: 139/64  Pulse: 86  Temp: (!) 97.2 F (36.2 C)  Weight: 171 lb 3.2 oz (77.7 kg)   Body mass index is 32.35 kg/m. Physical Exam Vitals and nursing note reviewed.  Constitutional:      General: She is not in acute distress.    Appearance: She is not diaphoretic.     Comments: Sleepy but easily arouses  HENT:     Head: Normocephalic and atraumatic.     Nose: Nose normal. No congestion.     Mouth/Throat:     Mouth: Mucous membranes are dry.     Pharynx: Oropharynx is clear. No oropharyngeal exudate.  Eyes:     General:        Right eye: No discharge.        Left eye: No discharge.     Conjunctiva/sclera: Conjunctivae normal.     Pupils: Pupils are equal, round, and reactive to light.  Neck:  Vascular: No carotid bruit or JVD.  Cardiovascular:     Rate and Rhythm: Normal rate and regular rhythm.     Heart sounds: No murmur.  Pulmonary:     Effort: Pulmonary effort is normal. No respiratory distress.     Breath sounds: Normal breath sounds. No wheezing.  Abdominal:     General: Bowel sounds are normal. There is no distension.     Palpations: Abdomen is soft. There is no mass.     Tenderness: There is abdominal tenderness (s/p). There is no right CVA tenderness or left CVA tenderness.     Hernia: No hernia is present.  Musculoskeletal:     Cervical back: No rigidity or tenderness.     Right lower leg: Edema (+1) present.     Left lower leg: Edema (+1) present.  Lymphadenopathy:     Cervical: No cervical adenopathy.  Skin:    General: Skin is warm and dry.  Neurological:     Comments: Oriented x 2. Confused about the details of her care. Right sided weakness is unchanged. No new focal deficits are observed.   Psychiatric:        Mood and Affect: Mood normal.     Labs reviewed: No results for input(s): NA, K, CL, CO2, GLUCOSE, BUN, CREATININE, CALCIUM, MG, PHOS in the last 8760 hours. No results for input(s): AST, ALT, ALKPHOS, BILITOT, PROT, ALBUMIN in the  last 8760 hours. No results for input(s): WBC, NEUTROABS, HGB, HCT, MCV, PLT in the last 8760 hours. Lab Results  Component Value Date   TSH 1.32 10/07/2018   Lab Results  Component Value Date   HGBA1C 7.4 05/07/2019   Lab Results  Component Value Date   CHOL 135 09/19/2015   HDL 50 09/19/2015   LDLCALC 55 09/19/2015   TRIG 149 09/19/2015   CHOLHDL 4.1 07/01/2012    Significant Diagnostic Results in last 30 days:  No results found.  Assessment/Plan 1. Delirium with dementia ?UTI and/or dehydration.  Check UA C and S and BMP   2. Dysuria Encourage to drink water q 2 hrs while awake and check UA C and S Would treat given her discomfort and inability to operate her scooter which is in important to her QOL.   3. Right groin pain Mild tenderness on exam with no palpable mass.  UTI vs stone Given her goals of care would not send her out or order any aggressive testing or referral.  Await for UA result and treat if indicated.     Family/ staff Communication: discussed with Ms. Cuellar and her nurse Tammy  Labs/tests ordered:  BMP and UA C and S

## 2019-06-30 ENCOUNTER — Encounter: Payer: Self-pay | Admitting: Internal Medicine

## 2019-06-30 DIAGNOSIS — R300 Dysuria: Secondary | ICD-10-CM | POA: Diagnosis not present

## 2019-06-30 DIAGNOSIS — I5020 Unspecified systolic (congestive) heart failure: Secondary | ICD-10-CM | POA: Diagnosis not present

## 2019-06-30 DIAGNOSIS — D649 Anemia, unspecified: Secondary | ICD-10-CM | POA: Diagnosis not present

## 2019-06-30 DIAGNOSIS — R319 Hematuria, unspecified: Secondary | ICD-10-CM | POA: Diagnosis not present

## 2019-06-30 DIAGNOSIS — N390 Urinary tract infection, site not specified: Secondary | ICD-10-CM | POA: Diagnosis not present

## 2019-07-01 ENCOUNTER — Encounter: Payer: Self-pay | Admitting: Internal Medicine

## 2019-07-02 ENCOUNTER — Telehealth: Payer: Self-pay | Admitting: Adult Health

## 2019-07-02 DIAGNOSIS — N3000 Acute cystitis without hematuria: Secondary | ICD-10-CM

## 2019-07-02 NOTE — Telephone Encounter (Signed)
Resident has been experiencing increased confusion, frequency, and groin pain. UA C and returned with >100,000 colonies of 2 gram negative orgranisms.  Cipro 500 mg bid x 7 days prescribed with Florastor 1 cap bid x 7 days.

## 2019-07-03 ENCOUNTER — Telehealth: Payer: Self-pay | Admitting: Adult Health

## 2019-07-03 DIAGNOSIS — F411 Generalized anxiety disorder: Secondary | ICD-10-CM

## 2019-07-03 MED ORDER — LORAZEPAM 0.5 MG PO TABS: 0.2500 mg | ORAL_TABLET | Freq: Three times a day (TID) | ORAL | 0 refills | Status: DC | PRN

## 2019-07-03 NOTE — Telephone Encounter (Signed)
Nurse at [facility] contacted me and indicated that Rachel Cooke is experiencing increased agitation and confusion. She is currently being treated for a UTI but otherwise her goals of care are comfort based. Her family would like something as needed for anxiety. Ativan 0.25 mg tid prn sent.

## 2019-07-07 ENCOUNTER — Non-Acute Institutional Stay (SKILLED_NURSING_FACILITY): Payer: Medicare Other | Admitting: Internal Medicine

## 2019-07-07 ENCOUNTER — Encounter: Payer: Self-pay | Admitting: Internal Medicine

## 2019-07-07 DIAGNOSIS — E039 Hypothyroidism, unspecified: Secondary | ICD-10-CM

## 2019-07-07 DIAGNOSIS — I442 Atrioventricular block, complete: Secondary | ICD-10-CM

## 2019-07-07 DIAGNOSIS — F0150 Vascular dementia without behavioral disturbance: Secondary | ICD-10-CM

## 2019-07-07 DIAGNOSIS — K224 Dyskinesia of esophagus: Secondary | ICD-10-CM

## 2019-07-07 DIAGNOSIS — R410 Disorientation, unspecified: Secondary | ICD-10-CM | POA: Diagnosis not present

## 2019-07-07 DIAGNOSIS — N3000 Acute cystitis without hematuria: Secondary | ICD-10-CM

## 2019-07-07 DIAGNOSIS — I69351 Hemiplegia and hemiparesis following cerebral infarction affecting right dominant side: Secondary | ICD-10-CM

## 2019-07-07 DIAGNOSIS — E118 Type 2 diabetes mellitus with unspecified complications: Secondary | ICD-10-CM

## 2019-07-07 DIAGNOSIS — I5032 Chronic diastolic (congestive) heart failure: Secondary | ICD-10-CM

## 2019-07-07 DIAGNOSIS — F339 Major depressive disorder, recurrent, unspecified: Secondary | ICD-10-CM

## 2019-07-07 DIAGNOSIS — I10 Essential (primary) hypertension: Secondary | ICD-10-CM

## 2019-07-07 NOTE — Progress Notes (Signed)
Provider:  Rexene Edison. Mariea Clonts, D.O., C.M.D. Location: [place] Room Number: [place] of Service:  SNF (31)   PCP: [name], DO Patient Care Team: [name], DO as PCP - General (Geriatric Medicine) [facility] Gwenette Greet, MD as Attending Physician (Gastroenterology) Garvin Fila, MD as Consulting Physician (Neurology) Julianne Handler, NP as Nurse Practitioner (Hospice and Palliative Medicine) Luberta Mutter, MD as Consulting Physician (Ophthalmology)  Extended Emergency Contact Information Primary Emergency Contact: [place] Address: [address]          [place], [place] [address] [name] of [place] Phone: [phone] Mobile Phone: [phone] Relation: Daughter  Code Status: DNR, MOST Goals of Care: Advanced Directive information Advanced Directives 07/07/2019  Does Patient Have a Medical Advance Directive? Yes  Type of Advance Directive Out of facility DNR (pink MOST or yellow form)  Does patient want to make changes to medical advance directive? No - Patient declined  Copy of [place] in Chart? -  Pre-existing out of facility DNR order (yellow form or pink MOST form) -     Chief Complaint  Patient presents with  . Annual Exam    Annual physical exam     HPI: Patient is a 86 y.o. female with prior stroke with dysphagia and right hemiparesis that brought her to skilled care several years ago, vascular dementia, chronic venous insufficiency, diastolic chf, lymphedema, obesity, hypothyroidism, htn, hyperlipidemia and longstanding depression seen today for an annual comprehensive examination.  Vickii Chafe has had a considerable decline cognitively recently.  She's been struggling for several months with more memory loss, but now it's become more noticeable with her being out and about in her power scooter after covid--she's unable to locate her room and comes to the  nurses' station for direction (this happened 3 times over the hour or so I was sitting there).  She also cannot find words to describe where she is trying to go (lunch or her room).  She is pleasant, but at times gets very anxious and upset and nursing has ativan for her in those instances.    Her goals have been comfort-based for a long time.    Her latest MMSE score was 20/30 in august of last year and she failed her clock drawing.    Due to her increased confusion, she had labs which were benign outside of her UA c+s which did show UTI for which she's almost finished with cipro and florastor without improvement in cognitive status per nursing.  There are concerns that she does not always push the stop button on her wheelchair promptly when she is intending to stop in front of staff.  We are concerned that if she does not have the chair to get around, she will not do well with her mood and decline more rapidly.    Past Medical History:  Diagnosis Date  . Abnormality of gait 2007  . Acquired cyst of kidney 2002  . Acute sinus infection 11/25/2012  . Allergic rhinitis due to pollen   . Altered mental status 11/28/2012  . Anxiety state, unspecified   . Atrioventricular block, complete ([place]) 2003   s/p PPM 2003, generator chnage 2011  . Cardiac pacemaker in situ 2003  . Coronary atherosclerosis of native coronary artery 2003   non obstructive by Cath 2003  . CVA (cerebral infarction) 06/28/2012   rt hemiparesis, dysphagia  . Dendritic keratitis 2012  . Dysuria   . Edema  2003  . Esophageal dysmotility 08/19/2012  . Food impaction of esophagus 08/15/2012  . HTN (hypertension) 06/28/2012  . Hypertension   . Macular degeneration (senile) of retina, unspecified 2012  . Malignant neoplasm of breast (female), unspecified site 1970   S/P Rt radical mastectomy  . NSTEMI (non-ST elevated myocardial infarction) ([place]) 06/28/2012  . Osteoarthrosis, unspecified whether generalized or localized,  unspecified site 2013  . Pacemaker   . Personal history of colonic polyps     s/p colonoscopy/polypectomy 2003  . Pure hyperglyceridemia 2004  . Senile osteoporosis 2003  . Stroke ([place]) 06/28/2012  . Tinnitus 2007  . Type II or unspecified type diabetes mellitus without mention of complication, not stated as uncontrolled 2003  . Unspecified arthropathy, pelvic region and thigh 2010  . Unspecified glaucoma(365.9) 2013  . Unspecified hypothyroidism 2009  . Unspecified vitamin D deficiency 2009  . Urinary retention 07/07/2012   Past Surgical History:  Procedure Laterality Date  . BOTOX INJECTION N/A 10/03/2012   Procedure: BOTOX INJECTION;  Surgeon: [name], MD;  Location: [facility] ENDOSCOPY;  Service: Endoscopy;  Laterality: N/A;  . ESOPHAGOGASTRODUODENOSCOPY N/A 08/15/2012   Procedure: ESOPHAGOGASTRODUODENOSCOPY (EGD);  Surgeon: [name], MD;  Location: [facility] ENDOSCOPY;  Service: Endoscopy;  Laterality: N/A;  . ESOPHAGOGASTRODUODENOSCOPY N/A 08/15/2012   Procedure: ESOPHAGOGASTRODUODENOSCOPY (EGD);  Surgeon: [name], MD;  Location: [facility];  Service: Endoscopy;  Laterality: N/A;  . ESOPHAGOGASTRODUODENOSCOPY N/A 10/03/2012   Procedure: ESOPHAGOGASTRODUODENOSCOPY (EGD);  Surgeon: [name], MD;  Location: [facility] ENDOSCOPY;  Service: Endoscopy;  Laterality: N/A;  . ESOPHAGOGASTRODUODENOSCOPY N/A 12/22/2012   Procedure: ESOPHAGOGASTRODUODENOSCOPY (EGD);  Surgeon: [name], MD;  Location: [facility] ENDOSCOPY;  Service: Endoscopy;  Laterality: N/A;  . ESOPHAGOSCOPY N/A 08/15/2012   Procedure: ESOPHAGOSCOPY;  Surgeon: [name], MD;  Location: [facility];  Service: ENT;  Laterality: N/A;  . FOREIGN BODY REMOVAL ESOPHAGEAL N/A 08/15/2012   Procedure: REMOVAL FOREIGN BODY ESOPHAGEAL;  Surgeon: [name], MD;  Location: [facility];  Service: ENT;  Laterality: N/A;  . INSERT / REPLACE / REMOVE PACEMAKER    . MASTECTOMY Right 1970   Cancer  . SAVORY DILATION N/A 10/03/2012   Procedure: SAVORY DILATION;  Surgeon:  [name], MD;  Location: [facility] ENDOSCOPY;  Service: Endoscopy;  Laterality: N/A;    reports that she has quit smoking. She has a 0.75 pack-year smoking history. She has never used smokeless tobacco. She reports that she does not drink alcohol or use drugs. Social History   Socioeconomic History  . Marital status: Widowed    Spouse name: Not on file  . Number of children: 3  . Years of education: 12th  . Highest education level: Not on file  Occupational History  . Occupation: retired  Tobacco Use  . Smoking status: Former Smoker    Packs/day: 0.25    Years: 3.00    Pack years: 0.75  . Smokeless tobacco: Never Used  Substance and Sexual Activity  . Alcohol use: No  . Drug use: No  . Sexual activity: Never  Other Topics Concern  . Not on file  Social History Narrative  . Not on file   Social Determinants of Health   Financial Resource Strain:   . Difficulty of Paying Living Expenses:   Food Insecurity:   . Worried About Charity fundraiser in the Last Year:   . Arboriculturist in the Last Year:   Transportation Needs:   . Lack of Transportation (  Medical):   Marland Kitchen Lack of Transportation (Non-Medical):   Physical Activity:   . Days of Exercise per Week:   . Minutes of Exercise per Session:   Stress:   . Feeling of Stress :   Social Connections:   . Frequency of Communication with Friends and Family:   . Frequency of Social Gatherings with Friends and Family:   . Attends Religious Services:   . Active Member of Clubs or Organizations:   . Attends Archivist Meetings:   Marland Kitchen Marital Status:    Family History  Problem Relation Age of Onset  . Hypertension Father     Pertinent  Health Maintenance Due  Topic Date Due  . FOOT EXAM  07/14/2019  . OPHTHALMOLOGY EXAM  09/22/2019  . INFLUENZA VACCINE  10/04/2019  . PNA vac Low Risk Adult  Completed   Fall Risk  06/08/2019 01/02/2019 07/14/2018 04/08/2018 07/10/2017  Falls in the past year? 0 0 0 Exclusion - non  ambulatory No  Number falls in past yr: 0 0 0 - -  Injury with Fall? 0 0 0 - -  Risk for fall due to : Impaired mobility;Impaired balance/gait Impaired balance/gait Impaired balance/gait - -  Follow up Falls evaluation completed Falls evaluation completed - - -   Depression screen [facility] 2/9 01/02/2019 07/10/2017 06/27/2016 11/07/2015 01/24/2015  Decreased Interest 0 0 0 1 0  Down, Depressed, Hopeless 0 0 0 1 0  PHQ - 2 Score 0 0 0 2 0  Altered sleeping - - - 0 -  Tired, decreased energy - - - 0 -  Change in appetite - - - 0 -  Feeling bad or failure about yourself  - - - 1 -  Trouble concentrating - - - 0 -  Moving slowly or fidgety/restless - - - 0 -  Suicidal thoughts - - - 0 -  PHQ-9 Score - - - 3 -  Difficult doing work/chores - - - Somewhat difficult -    Functional Status Survey:    Allergies  Allergen Reactions  . Augmentin [Amoxicillin-Pot Clavulanate] Nausea And Vomiting  . Lactose Intolerance (Gi)   . Shingrix [Zoster Vac Recomb Adjuvanted] Rash    Outpatient Encounter Medications as of 07/07/2019  Medication Sig  . acetaminophen (TYLENOL) 650 MG CR tablet Take 650 mg by mouth 2 (two) times daily. For pain and every 4 hours as needed for up to 72 hours, document area of discomfort and effectiveness or medication  . albuterol (VENTOLIN HFA) 108 (90 Base) MCG/ACT inhaler Inhale 2 puffs into the lungs every 6 (six) hours as needed for wheezing or shortness of breath.  . carboxymethylcellulose (REFRESH PLUS) 0.5 % SOLN 2 drops 2 (two) times daily.   . ciprofloxacin (CIPRO) 500 MG tablet Take 500 mg by mouth 2 (two) times daily.  . Dentifrices (BIOTENE DRY MOUTH CARE DT) Take 1 spray by mouth 3 (three) times daily as needed.   . Docosanol (ABREVA) 10 % CREA Apply 1 application topically.  Marland Kitchen escitalopram (LEXAPRO) 20 MG tablet Take 20 mg by mouth daily. For depression/ anxiety.  . furosemide (LASIX) 40 MG tablet Take 40 mg by mouth daily.   Marland Kitchen GLUCERNA (GLUCERNA) LIQD Take 1 Can by  mouth daily. Divided 1/2 can with breakfast and 1/2 can at lunch  . levothyroxine (SYNTHROID) 75 MCG tablet Take 75 mcg by mouth daily before breakfast.  . liver oil-zinc oxide (DESITIN) 40 % ointment Apply 1 application topically as needed for irritation (to  buttocks and inner thighs).   . loperamide (IMODIUM A-D) 2 MG tablet Take 2 mg by mouth 4 (four) times daily as needed for diarrhea or loose stools. 1 tablet after each loose stool. Do not exceed 4 caps in 24 hours as needed  . LORazepam (ATIVAN) 0.5 MG tablet Take 0.5 tablets (0.25 mg total) by mouth at bedtime.  Marland Kitchen LORazepam (ATIVAN) 0.5 MG tablet Take 0.5 tablets (0.25 mg total) by mouth 3 (three) times daily as needed for anxiety.  Marland Kitchen losartan (COZAAR) 25 MG tablet Take 25 mg by mouth daily.  . metoprolol tartrate (LOPRESSOR) 25 MG tablet Take 25 mg by mouth 2 (two) times daily.  Marland Kitchen omeprazole (PRILOSEC) 20 MG capsule Take 20 mg by mouth daily.  . potassium chloride SA (K-DUR,KLOR-CON) 20 MEQ tablet Take 20 mEq by mouth daily.  Marland Kitchen saccharomyces boulardii (FLORASTOR) 250 MG capsule Take 250 mg by mouth 2 (two) times daily.  Addison Lank Hazel 14 % LIQD Apply topically as needed (for hemorrhoid flare up).   No facility-administered encounter medications on file as of 07/07/2019.    Review of Systems  Constitutional: Positive for fatigue. Negative for activity change, appetite change, chills and fever.  HENT: Positive for hearing loss. Negative for congestion.   Eyes: Negative for visual disturbance.       Glasses  Respiratory: Negative for shortness of breath.   Cardiovascular: Positive for leg swelling. Negative for chest pain and palpitations.  Gastrointestinal: Negative for abdominal pain and constipation.  Genitourinary: Negative for dysuria.  Musculoskeletal: Positive for gait problem.  Skin: Negative for color change.  Neurological: Negative for dizziness and weakness.       Hemiparesis  Psychiatric/Behavioral: Positive for agitation,  confusion and decreased concentration. Negative for behavioral problems, hallucinations and sleep disturbance. The patient is nervous/anxious.     Vitals:   07/07/19 1113  BP: 112/65  Pulse: 85  Temp: (!) 97.3 F (36.3 C)  SpO2: 94%  Weight: 171 lb 3.2 oz (77.7 kg)  Height: 5\' 1"  (1.549 m)   Body mass index is 32.35 kg/m. Physical Exam Vitals reviewed.  Constitutional:      General: She is not in acute distress.    Appearance: She is obese. She is not toxic-appearing.  HENT:     Head: Normocephalic and atraumatic.  Cardiovascular:     Rate and Rhythm: Normal rate and regular rhythm.  Pulmonary:     Effort: Pulmonary effort is normal.     Breath sounds: Normal breath sounds. No rales.  Abdominal:     General: Bowel sounds are normal. There is no distension.     Palpations: Abdomen is soft.  Musculoskeletal:     Right lower leg: Edema present.     Left lower leg: Edema present.     Comments: Right hemiparesis  Skin:    General: Skin is warm and dry.  Neurological:     Mental Status: She is alert.     Motor: Weakness present.     Gait: Gait abnormal.     Comments: Hemiparesis, increased difficulty with word-finding during conversation; getting lost in halls  Psychiatric:     Comments: Anxious and confused; difficulty concentrating     Labs reviewed: Basic Metabolic Panel: No results for input(s): NA, K, CL, CO2, GLUCOSE, BUN, CREATININE, CALCIUM, MG, PHOS in the last 8760 hours. Liver Function Tests: No results for input(s): AST, ALT, ALKPHOS, BILITOT, PROT, ALBUMIN in the last 8760 hours. No results for input(s): LIPASE, AMYLASE in the  last 8760 hours. No results for input(s): AMMONIA in the last 8760 hours. CBC: No results for input(s): WBC, NEUTROABS, HGB, HCT, MCV, PLT in the last 8760 hours. Cardiac Enzymes: No results for input(s): CKTOTAL, CKMB, CKMBINDEX, TROPONINI in the last 8760 hours. BNP: Invalid input(s): POCBNP Lab Results  Component Value Date     HGBA1C 7.4 05/07/2019   Lab Results  Component Value Date   TSH 1.32 10/07/2018   No results found for: VITAMINB12 No results found for: FOLATE No results found for: IRON, TIBC, FERRITIN   Assessment/Plan 1. Delirium -ongoing--seems to meet CAM criteria at present with lack of concentration, distractabiity, disorientation and change from baseline -complete abx, push fluids, reorient, monitor and hope for some improvement in cognition  2. Acute cystitis without hematuria -completing 7 day course of cipro/florastor--no marked improvement yet with delirium  3. Vascular dementia without behavioral disturbance ([place]) -progressive and was last noted in moderate stage on mmse--suspect this has declined further since, but is currently being treated for infection with delirium  4. Hemiparesis affecting right side as late effect of cerebrovascular accident [facility]) -continues power scooter use for mobility  5. Esophageal dysmotility -chronic and on modified diet with some special treats  6. Chronic diastolic congestive heart failure (HCC) -appears euvolemic at this time, cont daily lasix and potassium, bmp was wnl  7. Essential hypertension -bp controlled, cont same regimen and monitor  8. Atrioventricular block, complete ([place]) -has pacemaker in place; last visible interrogation in epic from 02/25/18 but done from distance   9. Controlled type 2 diabetes mellitus with complication, without long-term current use of insulin (HCC) -has been diet controlled; complicated by htn, hyperlipidemia -cont regular diet at her advanced age -cont ARB, not on asa or statin at advanced age with comfort-based goals  10. Acquired hypothyroidism -cont current levothyroxine and monitor  11. Depression, recurrent ([place]) -difficult to assess at present in view of delirium -on chronic lexapro  Family/ staff Communication: discussed with snf nurse  Labs/tests ordered:  No new; 4/23 labs reviewed (BMP,  UA c+s)  Parissa Chiao L. Bobbie Virden, D.O. [facility] [address], [place] [address] Cell Phone (Mon-Fri 8am-5pm):  [phone] On Call:  [phone] & follow prompts after 5pm & weekends Office Phone:  [phone] Office Fax:  [phone]

## 2019-07-14 ENCOUNTER — Encounter: Payer: Self-pay | Admitting: Internal Medicine

## 2019-07-20 ENCOUNTER — Other Ambulatory Visit: Payer: Self-pay | Admitting: Adult Health

## 2019-07-20 MED ORDER — LORAZEPAM 0.5 MG PO TABS: 0.2500 mg | ORAL_TABLET | Freq: Every day | ORAL | 0 refills | Status: AC | PRN

## 2019-07-20 MED ORDER — LORAZEPAM 0.5 MG PO TABS: 0.2500 mg | ORAL_TABLET | Freq: Two times a day (BID) | ORAL | 2 refills | Status: DC

## 2019-07-20 NOTE — Progress Notes (Signed)
Pt family member Heywood Iles is requesting scheduled Ativan for Ms. Harkless due to continued anxiety and delirium. Will schedule Ativan 0.25 mg bid and  Reduce prn dosing to 0.25 mg qd prn.   Pt is comfort care status.

## 2019-07-22 NOTE — Progress Notes (Signed)
May 20th, 2020 [facility] Telephone: [phone] Fax: [phone]   PATIENT NAME: Rachel Cooke DOB: [birth year] MRN: [id number] [facility] Fax # [phone]   PRIMARY CARE PROVIDER:  Royal Hawthorn NP ([facility])  [name], DO   REFERRING PROVIDER:  [name], DO [place], [place] [address]   RESPONSIBLE PARTY: (dtr) Verlon Au Rachel Cooke). Address: [address], [place], [place] [address]  Home Phone: [phone], Mobile Phone: [phone]     IMPRESSION / RECOMMENDATIONS: 1.Advance Care Planning:  A. Directives: DNR/MOST. MOST: DNR/DNI. Comfort level of medical intervantions. No to Antibiotics, IVFs or feeding tube. Both forms on chart and in [facility] EMR. HCPOA (dtr Barrie Lyme) in facility chart.  B. Goals of care: Comfort. No plans for aggressive w/u. Pt had PM with prob end of battery life; no plans to change out. Hospice eval when meets eligibility criteria.  2. Cognitive/Functional Status:   Patient has shown further cognitive decline, rather abruptly about 2 months ago with progression in short term memory loss, and increased anxiety. She continues on Lexapro 20mg /d, now with addition of Ativan 0.25mg  bid with additional dose qd prn. Completed course of Cipro to cover for possible UTI. On my eval today patient in good spirits. Pleasantly conversant and on topic. Mildly anxious with repetitious questions. Oriented to self; knows she is not home/in a facility. Knows has 3 children and that her daughter in [place] visits frequently but couldn't remember names. Able to relate recent birth of first great grandchild. She is still able to maneuver her motorized scooter about the hallways but now need direction how to get back to her room. She only rarely participates in facility activities. Try to maintain upbeat attitude; copes by "conversations with God". Spends majority of her time watching news programs. Very hard of  hearing. Daughter Rachel Cooke believes some of patient's confusion r/t recent liberalization of COVID restrictions with residents now eating in new dining area/different table companions.   Dependent for all ADSs. Transfers with use of sit-to-stand device. Mostly continent on toileting schedule. Able to feed herself.   Good appetite, maintaining her weight. 07/07/2019 weight 171lbs (200lbs 16 months ago). At a height of 5'1" her BMI is 32.25kg/m2.  Patient notes LE joint pain with standing, eases with back to sitting / laying.     3. Follow up: PC NP visit in 2-3 months or prn; monitor for hospice eligibility.  I called and spoke to daughter Rachel Cooke with updates. Rachel Cooke notes lifelong strained relationship with her mom. Patient tends to exhibit signs of depression and negative reactions to [name]'s visits, despite [name]'s efforts for a positive visit.    I spent 60 minutes providing this consultation, from 9am to 10am. More than 50% of this time was spent coordinating communication, interview of staff, review of chart, and discussions with daughter.    HPI / INTERVAL HISTORY: 86 yo female with history significant for vascular dementia (MMSE 23 Oct 2017), depression, AV block (PM), CVA (right sided weakness. motorized scooter) DM (not on meds), chronic venous insufficiency, diastolic CHF, lymphedema, obesity, hypothyroidism, HTN, HLD and longstanding depression, and intertrigo.  This is a f/u Palliative Care visit from 03/20/18.    PPS: 30%. ROS without significant changes.   HOSPICE ELIGIBILITY: No, as prognosis thought greater than 6 months. PAST MEDICAL HISTORY:  Past Medical History:  Diagnosis Date  . Abnormality of gait 2007  . Acquired cyst of kidney 2002  . Acute sinus infection 11/25/2012  .  Allergic rhinitis due to pollen   . Altered mental status 11/28/2012  . Anxiety state, unspecified   . Atrioventricular block, complete ([place]) 2003   s/p PPM 2003, generator chnage 2011  . Cardiac pacemaker  in situ 2003  . Coronary atherosclerosis of native coronary artery 2003   non obstructive by Cath 2003  . CVA (cerebral infarction) 06/28/2012   rt hemiparesis, dysphagia  . Dendritic keratitis 2012  . Dysuria   . Edema 2003  . Esophageal dysmotility 08/19/2012  . Food impaction of esophagus 08/15/2012  . HTN (hypertension) 06/28/2012  . Hypertension   . Macular degeneration (senile) of retina, unspecified 2012  . Malignant neoplasm of breast (female), unspecified site 1970   S/P Rt radical mastectomy  . NSTEMI (non-ST elevated myocardial infarction) ([place]) 06/28/2012  . Osteoarthrosis, unspecified whether generalized or localized, unspecified site 2013  . Pacemaker   . Personal history of colonic polyps     s/p colonoscopy/polypectomy 2003  . Pure hyperglyceridemia 2004  . Senile osteoporosis 2003  . Stroke ([place]) 06/28/2012  . Tinnitus 2007  . Type II or unspecified type diabetes mellitus without mention of complication, not stated as uncontrolled 2003  . Unspecified arthropathy, pelvic region and thigh 2010  . Unspecified glaucoma(365.9) 2013  . Unspecified hypothyroidism 2009  . Unspecified vitamin D deficiency 2009  . Urinary retention 07/07/2012    SOCIAL HX:  Social History   Tobacco Use  . Smoking status: Former Smoker    Packs/day: 0.25    Years: 3.00    Pack years: 0.75  . Smokeless tobacco: Never Used  Substance Use Topics  . Alcohol use: No    ALLERGIES:  Allergies  Allergen Reactions  . Augmentin [Amoxicillin-Pot Clavulanate] Nausea And Vomiting  . Lactose Intolerance (Gi)   . Shingrix [Zoster Vac Recomb Adjuvanted] Rash     PERTINENT MEDICATIONS:  Outpatient Encounter Medications as of 07/23/2019  Medication Sig  . acetaminophen (TYLENOL) 650 MG CR tablet Take 650 mg by mouth 2 (two) times daily. For pain and every 4 hours as needed for up to 72 hours, document area of discomfort and effectiveness or medication  . albuterol (VENTOLIN HFA) 108 (90 Base)  MCG/ACT inhaler Inhale 2 puffs into the lungs every 6 (six) hours as needed for wheezing or shortness of breath.  . carboxymethylcellulose (REFRESH PLUS) 0.5 % SOLN 2 drops 2 (two) times daily.   . ciprofloxacin (CIPRO) 500 MG tablet Take 500 mg by mouth 2 (two) times daily.  . Dentifrices (BIOTENE DRY MOUTH CARE DT) Take 1 spray by mouth 3 (three) times daily as needed.   . Docosanol (ABREVA) 10 % CREA Apply 1 application topically.  Marland Kitchen escitalopram (LEXAPRO) 20 MG tablet Take 20 mg by mouth daily. For depression/ anxiety.  . furosemide (LASIX) 40 MG tablet Take 40 mg by mouth daily.   Marland Kitchen GLUCERNA (GLUCERNA) LIQD Take 1 Can by mouth daily. Divided 1/2 can with breakfast and 1/2 can at lunch  . levothyroxine (SYNTHROID) 75 MCG tablet Take 75 mcg by mouth daily before breakfast.  . liver oil-zinc oxide (DESITIN) 40 % ointment Apply 1 application topically as needed for irritation (to buttocks and inner thighs).   . loperamide (IMODIUM A-D) 2 MG tablet Take 2 mg by mouth 4 (four) times daily as needed for diarrhea or loose stools. 1 tablet after each loose stool. Do not exceed 4 caps in 24 hours as needed  . LORazepam (ATIVAN) 0.5 MG tablet Take 0.5 tablets (0.25 mg total)  by mouth 2 (two) times daily.  Marland Kitchen LORazepam (ATIVAN) 0.5 MG tablet Take 0.5 tablets (0.25 mg total) by mouth daily as needed for up to 14 days for anxiety.  Marland Kitchen losartan (COZAAR) 25 MG tablet Take 25 mg by mouth daily.  . metoprolol tartrate (LOPRESSOR) 25 MG tablet Take 25 mg by mouth 2 (two) times daily.  Marland Kitchen omeprazole (PRILOSEC) 20 MG capsule Take 20 mg by mouth daily.  . potassium chloride SA (K-DUR,KLOR-CON) 20 MEQ tablet Take 20 mEq by mouth daily.  Marland Kitchen saccharomyces boulardii (FLORASTOR) 250 MG capsule Take 250 mg by mouth 2 (two) times daily.  Addison Lank Hazel 14 % LIQD Apply topically as needed (for hemorrhoid flare up).   No facility-administered encounter medications on file as of 07/23/2019.    PHYSICAL EXAM:   Well  nourished, pleasantly conversant elderly Caucasian female looking younger than her 24 years. Poor short/long term memory. Oriented to self.  Cardiovascular: regular rate and rhythm. Soft systolic murmur loudest R & L upper sternal boarder. Pulmonary: bibasilar very soft inspiratory crackles; distant lung sounds Neurological:  nonfocal; generalized weakness.  Julianne Handler, NP

## 2019-07-23 ENCOUNTER — Encounter: Payer: Self-pay | Admitting: Internal Medicine

## 2019-07-23 ENCOUNTER — Non-Acute Institutional Stay: Payer: Medicare Other | Admitting: Internal Medicine

## 2019-07-23 ENCOUNTER — Other Ambulatory Visit: Payer: Self-pay

## 2019-07-23 VITALS — Ht 61.0 in | Wt 171.0 lb

## 2019-07-23 DIAGNOSIS — Z7189 Other specified counseling: Secondary | ICD-10-CM

## 2019-07-23 DIAGNOSIS — Z515 Encounter for palliative care: Secondary | ICD-10-CM

## 2019-08-10 ENCOUNTER — Non-Acute Institutional Stay (SKILLED_NURSING_FACILITY): Payer: Medicare Other | Admitting: Adult Health

## 2019-08-10 ENCOUNTER — Encounter: Payer: Self-pay | Admitting: Adult Health

## 2019-08-10 DIAGNOSIS — I890 Lymphedema, not elsewhere classified: Secondary | ICD-10-CM | POA: Diagnosis not present

## 2019-08-10 DIAGNOSIS — F339 Major depressive disorder, recurrent, unspecified: Secondary | ICD-10-CM

## 2019-08-10 DIAGNOSIS — F0151 Vascular dementia with behavioral disturbance: Secondary | ICD-10-CM

## 2019-08-10 DIAGNOSIS — R0902 Hypoxemia: Secondary | ICD-10-CM | POA: Diagnosis not present

## 2019-08-10 DIAGNOSIS — F01518 Vascular dementia, unspecified severity, with other behavioral disturbance: Secondary | ICD-10-CM

## 2019-08-10 DIAGNOSIS — I5032 Chronic diastolic (congestive) heart failure: Secondary | ICD-10-CM

## 2019-08-10 NOTE — Progress Notes (Signed)
Location:  Occupational psychologist of Service:  SNF (31) Provider:   Cindi Carbon, ANP [place] [phone]   [name], DO  Patient Care Team: [name], DO as PCP - General (Geriatric Medicine) [name], Altamese Dilling, MD as Attending Physician (Gastroenterology) Garvin Fila, MD as Consulting Physician (Neurology) Julianne Handler, NP as Nurse Practitioner (Hospice and Palliative Medicine) Luberta Mutter, MD as Consulting Physician (Ophthalmology)  Extended Emergency Contact Information Primary Emergency Contact: Dalton Address: [address]          [place], [place] [address] [name] of [place] Phone: [phone] Mobile Phone: [phone] Relation: Daughter  Code Status:  DNR Goals of care: Advanced Directive information Advanced Directives 07/07/2019  Does Patient Have a Medical Advance Directive? Yes  Type of Advance Directive Out of facility DNR (pink MOST or yellow form)  Does patient want to make changes to medical advance directive? No - Patient declined  Copy of [place] in Chart? -  Pre-existing out of facility DNR order (yellow form or pink MOST form) -     Chief Complaint  Patient presents with  . Acute Visit    edema  . Medical Management of Chronic Issues    HPI:  Pt is a 86 y.o. female seen today for right arm edema medical management of chronic diseases.    The nurse reports the edema on the right arm is worse with some hard pockets. She has a hx of lymphedema after a mastectomy for breast cancer surgery. She does not like compression sleeves.  In addition she has gained 3-4 lbs over the past month. She has chronic edema in both legs and wears compression hose and takes 40 mg of lasix daily. She has not had any sob, pnd, or cp. She wears oxygen each evening now due to low readings at night.   Pacemaker in place but no longer is followed  by cardiology for this issue due to her comfort care goals.   She has vascular dementia with hx of associated CVA with right sided weakness. Over the past month this has worsened. She can become disoriented and repetitive. She has delusions that she is going home and doesn't know where he daughter is. These behaviors are upsetting to her and her family and seem to get worse as the day goes on. We tried low dose ativan which helped some initially but not at this time.      Past Medical History:  Diagnosis Date  . Abnormality of gait 2007  . Acquired cyst of kidney 2002  . Acute sinus infection 11/25/2012  . Allergic rhinitis due to pollen   . Altered mental status 11/28/2012  . Anxiety state, unspecified   . Atrioventricular block, complete ([place]) 2003   s/p PPM 2003, generator chnage 2011  . Cardiac pacemaker in situ 2003  . Coronary atherosclerosis of native coronary artery 2003   non obstructive by Cath 2003  . CVA (cerebral infarction) 06/28/2012   rt hemiparesis, dysphagia  . Dendritic keratitis 2012  . Dysuria   . Edema 2003  . Esophageal dysmotility 08/19/2012  . Food impaction of esophagus 08/15/2012  . HTN (hypertension) 06/28/2012  . Hypertension   . Macular degeneration (senile) of retina, unspecified 2012  . Malignant neoplasm of breast (female), unspecified site 1970   S/P Rt radical mastectomy  . NSTEMI (non-ST elevated myocardial infarction) ([place]) 06/28/2012  . Osteoarthrosis, unspecified whether generalized  or localized, unspecified site 2013  . Pacemaker   . Personal history of colonic polyps     s/p colonoscopy/polypectomy 2003  . Pure hyperglyceridemia 2004  . Senile osteoporosis 2003  . Stroke ([place]) 06/28/2012  . Tinnitus 2007  . Type II or unspecified type diabetes mellitus without mention of complication, not stated as uncontrolled 2003  . Unspecified arthropathy, pelvic region and thigh 2010  . Unspecified glaucoma(365.9) 2013  . Unspecified hypothyroidism  2009  . Unspecified vitamin D deficiency 2009  . Urinary retention 07/07/2012   Past Surgical History:  Procedure Laterality Date  . BOTOX INJECTION N/A 10/03/2012   Procedure: BOTOX INJECTION;  Surgeon: [name], MD;  Location: [facility];  Service: Endoscopy;  Laterality: N/A;  . ESOPHAGOGASTRODUODENOSCOPY N/A 08/15/2012   Procedure: ESOPHAGOGASTRODUODENOSCOPY (EGD);  Surgeon: [name], MD;  Location: [facility] ENDOSCOPY;  Service: Endoscopy;  Laterality: N/A;  . ESOPHAGOGASTRODUODENOSCOPY N/A 08/15/2012   Procedure: ESOPHAGOGASTRODUODENOSCOPY (EGD);  Surgeon: [name], MD;  Location: [facility];  Service: Endoscopy;  Laterality: N/A;  . ESOPHAGOGASTRODUODENOSCOPY N/A 10/03/2012   Procedure: ESOPHAGOGASTRODUODENOSCOPY (EGD);  Surgeon: [name], MD;  Location: [facility];  Service: Endoscopy;  Laterality: N/A;  . ESOPHAGOGASTRODUODENOSCOPY N/A 12/22/2012   Procedure: ESOPHAGOGASTRODUODENOSCOPY (EGD);  Surgeon: [name], MD;  Location: [facility] ENDOSCOPY;  Service: Endoscopy;  Laterality: N/A;  . ESOPHAGOSCOPY N/A 08/15/2012   Procedure: ESOPHAGOSCOPY;  Surgeon: [name], MD;  Location: [facility];  Service: ENT;  Laterality: N/A;  . FOREIGN BODY REMOVAL ESOPHAGEAL N/A 08/15/2012   Procedure: REMOVAL FOREIGN BODY ESOPHAGEAL;  Surgeon: [name], MD;  Location: [facility];  Service: ENT;  Laterality: N/A;  . INSERT / REPLACE / REMOVE PACEMAKER    . MASTECTOMY Right 1970   Cancer  . SAVORY DILATION N/A 10/03/2012   Procedure: SAVORY DILATION;  Surgeon: [name], MD;  Location: [facility];  Service: Endoscopy;  Laterality: N/A;    Allergies  Allergen Reactions  . Augmentin [Amoxicillin-Pot Clavulanate] Nausea And Vomiting  . Lactose Intolerance (Gi)   . Shingrix [Zoster Vac Recomb Adjuvanted] Rash    Outpatient Encounter Medications as of 08/10/2019  Medication Sig  . LORazepam (ATIVAN) 0.5 MG tablet Take 0.25 mg by mouth daily as needed for anxiety.  Marland Kitchen QUEtiapine (SEROQUEL) 25 MG tablet  Take 12.5 mg by mouth 2 (two) times daily.  Marland Kitchen acetaminophen (TYLENOL) 650 MG CR tablet Take 650 mg by mouth 2 (two) times daily. For pain and every 4 hours as needed for up to 72 hours, document area of discomfort and effectiveness or medication  . albuterol (VENTOLIN HFA) 108 (90 Base) MCG/ACT inhaler Inhale 2 puffs into the lungs every 6 (six) hours as needed for wheezing or shortness of breath.  . carboxymethylcellulose (REFRESH PLUS) 0.5 % SOLN 2 drops 2 (two) times daily.   . Dentifrices (BIOTENE DRY MOUTH CARE DT) Take 1 spray by mouth 3 (three) times daily as needed.   . Docosanol (ABREVA) 10 % CREA Apply 1 application topically.  Marland Kitchen escitalopram (LEXAPRO) 20 MG tablet Take 20 mg by mouth daily. For depression/ anxiety.  . furosemide (LASIX) 40 MG tablet Take 40 mg by mouth daily.   Marland Kitchen GLUCERNA (GLUCERNA) LIQD Take 1 Can by mouth daily. Divided 1/2 can with breakfast and 1/2 can at lunch  . levothyroxine (SYNTHROID) 75 MCG tablet Take 75 mcg by mouth daily before breakfast.  . liver oil-zinc oxide (DESITIN) 40 % ointment Apply 1 application topically as needed  for irritation (to buttocks and inner thighs).   . loperamide (IMODIUM A-D) 2 MG tablet Take 2 mg by mouth 4 (four) times daily as needed for diarrhea or loose stools. 1 tablet after each loose stool. Do not exceed 4 caps in 24 hours as needed  . LORazepam (ATIVAN) 0.5 MG tablet Take 0.5 tablets (0.25 mg total) by mouth 2 (two) times daily. (Patient taking differently: Take 0.25 mg by mouth at bedtime. )  . losartan (COZAAR) 25 MG tablet Take 25 mg by mouth daily.  . metoprolol tartrate (LOPRESSOR) 25 MG tablet Take 25 mg by mouth 2 (two) times daily.  Marland Kitchen omeprazole (PRILOSEC) 20 MG capsule Take 20 mg by mouth daily.  . potassium chloride SA (K-DUR,KLOR-CON) 20 MEQ tablet Take 20 mEq by mouth daily.  Addison Lank Hazel 14 % LIQD Apply topically as needed (for hemorrhoid flare up).  . [DISCONTINUED] ciprofloxacin (CIPRO) 500 MG tablet Take 500  mg by mouth 2 (two) times daily.  . [DISCONTINUED] saccharomyces boulardii (FLORASTOR) 250 MG capsule Take 250 mg by mouth 2 (two) times daily.   No facility-administered encounter medications on file as of 08/10/2019.    Review of Systems  Constitutional: Negative for activity change, appetite change, chills, diaphoresis, fatigue, fever and unexpected weight change.  HENT: Positive for trouble swallowing. Negative for congestion.   Respiratory: Negative for cough, shortness of breath and wheezing.   Cardiovascular: Positive for leg swelling. Negative for chest pain and palpitations.  Gastrointestinal: Negative for abdominal distention, abdominal pain, constipation and diarrhea.  Genitourinary: Negative for difficulty urinating and dysuria.  Musculoskeletal: Positive for gait problem. Negative for arthralgias, back pain, joint swelling and myalgias.       Right arm edema  Neurological: Positive for weakness. Negative for dizziness, tremors, seizures, syncope, facial asymmetry, speech difficulty, light-headedness, numbness and headaches.  Psychiatric/Behavioral: Positive for agitation, behavioral problems and confusion. Negative for self-injury, sleep disturbance and suicidal ideas. The patient is nervous/anxious.        Delusions    Immunization History  Administered Date(s) Administered  . Influenza Inj Mdck Quad Pf 12/22/2015  . Influenza Whole 12/17/2012  . Influenza, High Dose Seasonal PF 12/18/2018  . Influenza,inj,Quad PF,6+ Mos 12/24/2017  . Influenza-Unspecified 12/08/2013, 12/16/2014, 12/27/2016  . Moderna SARS-COVID-2 Vaccination 03/10/2019, 04/07/2019  . PPD Test 07/18/2012  . Pneumococcal Conjugate-13 08/27/2015   Pertinent  Health Maintenance Due  Topic Date Due  . FOOT EXAM  07/14/2019  . OPHTHALMOLOGY EXAM  09/22/2019  . INFLUENZA VACCINE  10/04/2019  . PNA vac Low Risk Adult  Completed   Fall Risk  06/08/2019 01/02/2019 07/14/2018 04/08/2018 07/10/2017  Falls in the past  year? 0 0 0 Exclusion - non ambulatory No  Number falls in past yr: 0 0 0 - -  Injury with Fall? 0 0 0 - -  Risk for fall due to : Impaired mobility;Impaired balance/gait Impaired balance/gait Impaired balance/gait - -  Follow up Falls evaluation completed Falls evaluation completed - - -   Functional Status Survey:    Vitals:   08/10/19 1312  BP: 113/73  Pulse: 88  Resp: 18  Temp: (!) 97.2 F (36.2 C)  Weight: 174 lb (78.9 kg)   Body mass index is 32.88 kg/m.  Wt Readings from Last 3 Encounters:  08/10/19 174 lb (78.9 kg)  07/24/19 171 lb (77.6 kg)  07/07/19 171 lb 3.2 oz (77.7 kg)    Physical Exam Vitals and nursing note reviewed.  Constitutional:  General: She is not in acute distress.    Appearance: She is not diaphoretic.  HENT:     Head: Normocephalic and atraumatic.     Mouth/Throat:     Mouth: Mucous membranes are moist.     Pharynx: Oropharynx is clear.  Neck:     Vascular: No JVD.  Cardiovascular:     Rate and Rhythm: Normal rate and regular rhythm.     Heart sounds: No murmur.  Pulmonary:     Effort: Pulmonary effort is normal. No respiratory distress.     Breath sounds: Rales present. No wheezing.  Abdominal:     General: Bowel sounds are normal. There is no distension.     Palpations: Abdomen is soft.  Musculoskeletal:        General: Swelling (RUE with mild erythema at the upper arm area no drainage, open areas) present.     Right lower leg: Edema (+1) present.     Left lower leg: Edema (+1) present.  Skin:    General: Skin is warm and dry.  Neurological:     Mental Status: She is alert.     Comments: Oriented to self and place but not time. Has right sided weakness.   Psychiatric:        Mood and Affect: Mood normal.     Labs reviewed: No results for input(s): NA, K, CL, CO2, GLUCOSE, BUN, CREATININE, CALCIUM, MG, PHOS in the last 8760 hours. No results for input(s): AST, ALT, ALKPHOS, BILITOT, PROT, ALBUMIN in the last 8760 hours. No  results for input(s): WBC, NEUTROABS, HGB, HCT, MCV, PLT in the last 8760 hours. Lab Results  Component Value Date   TSH 1.32 10/07/2018   Lab Results  Component Value Date   HGBA1C 7.4 05/07/2019   Lab Results  Component Value Date   CHOL 135 09/19/2015   HDL 50 09/19/2015   LDLCALC 55 09/19/2015   TRIG 149 09/19/2015   CHOLHDL 4.1 07/01/2012    Significant Diagnostic Results in last 30 days:  No results found.  Assessment/Plan  1. Chronic diastolic congestive heart failure (HCC) 3-4 lb weight gain, give one additional dose of Lasix 40 mg  2. Lymphedema of arm Does not like compressive arm sleeve. Worsening edema, see #1 Recommend elevation. Due to her goals of care did not order OT for lymphedema therapy.  3. Vascular dementia with behavior disturbance (HCC) Worsening with agitations and delusions Begin seroquel 12.5 mg bid Reduce ativan 0.25 mg qhs   4. Depression, recurrent ([place]) Continues with periods of anxiety/frustration, would not taper Lexapro. Hopefully the seroquel will help to stabilize symptoms  5. Hypoxia Noted at night. Continue oxygen 2 liters each evening.   Family/ staff Communication: discussed with her nurse  Labs/tests ordered:  NA

## 2019-08-20 ENCOUNTER — Other Ambulatory Visit: Payer: Self-pay | Admitting: Adult Health

## 2019-08-20 ENCOUNTER — Non-Acute Institutional Stay (SKILLED_NURSING_FACILITY): Payer: Medicare Other | Admitting: Adult Health

## 2019-08-20 ENCOUNTER — Encounter: Payer: Self-pay | Admitting: Adult Health

## 2019-08-20 DIAGNOSIS — F4321 Adjustment disorder with depressed mood: Secondary | ICD-10-CM | POA: Diagnosis not present

## 2019-08-20 DIAGNOSIS — F0151 Vascular dementia with behavioral disturbance: Secondary | ICD-10-CM | POA: Diagnosis not present

## 2019-08-20 DIAGNOSIS — F01518 Vascular dementia, unspecified severity, with other behavioral disturbance: Secondary | ICD-10-CM

## 2019-08-20 MED ORDER — LORAZEPAM 0.5 MG PO TABS: 0.5000 mg | ORAL_TABLET | Freq: Three times a day (TID) | ORAL | 0 refills | Status: DC | PRN

## 2019-08-20 NOTE — Progress Notes (Signed)
Location:  Occupational psychologist of Service:  SNF (31) Provider:   Cindi Carbon, ANP [place] [phone]   [name], DO  Patient Care Team: [name], DO as PCP - General (Geriatric Medicine) [name], Altamese Dilling, MD as Attending Physician (Gastroenterology) Garvin Fila, MD as Consulting Physician (Neurology) Julianne Handler, NP as Nurse Practitioner (Hospice and Palliative Medicine) Luberta Mutter, MD as Consulting Physician (Ophthalmology)  Extended Emergency Contact Information Primary Emergency Contact: Stewart Address: [address]          [place], [place] [address] [name] of [place] Phone: [phone] Mobile Phone: [phone] Relation: Daughter  Code Status:  DNR Goals of care: Advanced Directive information Advanced Directives 07/07/2019  Does Patient Have a Medical Advance Directive? Yes  Type of Advance Directive Out of facility DNR (pink MOST or yellow form)  Does patient want to make changes to medical advance directive? No - Patient declined  Copy of [place] in Chart? -  Pre-existing out of facility DNR order (yellow form or pink MOST form) -     Chief Complaint  Patient presents with   Acute Visit    anxiety and delirium    HPI:  Pt is a 86 y.o. female seen today for an acute visit for anxiety and delirium. Ms. Teehan has a hx of CVA with right sided weakness and vascular dementia. Over the past 6 weeks she has had a significant decline in her mental status with periods of increased confusion and disorientation that are causing her to have increased anxiety.  She calls her daughter [name] very upset and doesn't know where she is or how to use her call bell. She eloped from the skilled care unit this week and therefore her motorized chair was taken away. For my visit she reports some feelings of sadness about her family and there "issues"  but she doesn't want to elaborate. She feels that she can not be of help to them and this bothers her. She reports that she sleeps well at night and her appetite is unchanged. She is calm during my visit and knows her location and date but there are other parts of the day where she is disoriented. There are no periods of hallucinations reported. Some delusional thinking is noted with her thinking that she is in a house not a unit at [facility]. Her vitals are WNL. She has had labs and been treated with an antibiotic for a UTI with no improvement in her symptoms. Her daughter discussed this with [facility] and was concerned that her medications were causing these symptoms. She was started on seroquel on 6/7 for this reason. There is no significant improvement or decline noted per the staff.    Past Medical History:  Diagnosis Date   Abnormality of gait 2007   Acquired cyst of kidney 2002   Acute sinus infection 11/25/2012   Allergic rhinitis due to pollen    Altered mental status 11/28/2012   Anxiety state, unspecified    Atrioventricular block, complete ([place]) 2003   s/p PPM 2003, generator chnage 2011   Cardiac pacemaker in situ 2003   Coronary atherosclerosis of native coronary artery 2003   non obstructive by Cath 2003   CVA (cerebral infarction) 06/28/2012   rt hemiparesis, dysphagia   Dendritic keratitis 2012   Dysuria    Edema 2003   Esophageal dysmotility 08/19/2012   Food impaction of esophagus 08/15/2012  HTN (hypertension) 06/28/2012   Hypertension    Macular degeneration (senile) of retina, unspecified 2012   Malignant neoplasm of breast (female), unspecified site 1970   S/P Rt radical mastectomy   NSTEMI (non-ST elevated myocardial infarction) ([place]) 06/28/2012   Osteoarthrosis, unspecified whether generalized or localized, unspecified site 2013   Pacemaker    Personal history of colonic polyps     s/p colonoscopy/polypectomy 2003   Pure hyperglyceridemia  2004   Senile osteoporosis 2003   Stroke ([place]) 06/28/2012   Tinnitus 2007   Type II or unspecified type diabetes mellitus without mention of complication, not stated as uncontrolled 2003   Unspecified arthropathy, pelvic region and thigh 2010   Unspecified glaucoma(365.9) 2013   Unspecified hypothyroidism 2009   Unspecified vitamin D deficiency 2009   Urinary retention 07/07/2012   Past Surgical History:  Procedure Laterality Date   BOTOX INJECTION N/A 10/03/2012   Procedure: BOTOX INJECTION;  Surgeon: [name], MD;  Location: [facility] ENDOSCOPY;  Service: Endoscopy;  Laterality: N/A;   ESOPHAGOGASTRODUODENOSCOPY N/A 08/15/2012   Procedure: ESOPHAGOGASTRODUODENOSCOPY (EGD);  Surgeon: [name], MD;  Location: [facility] ENDOSCOPY;  Service: Endoscopy;  Laterality: N/A;   ESOPHAGOGASTRODUODENOSCOPY N/A 08/15/2012   Procedure: ESOPHAGOGASTRODUODENOSCOPY (EGD);  Surgeon: [name], MD;  Location: [facility];  Service: Endoscopy;  Laterality: N/A;   ESOPHAGOGASTRODUODENOSCOPY N/A 10/03/2012   Procedure: ESOPHAGOGASTRODUODENOSCOPY (EGD);  Surgeon: [name], MD;  Location: [facility];  Service: Endoscopy;  Laterality: N/A;   ESOPHAGOGASTRODUODENOSCOPY N/A 12/22/2012   Procedure: ESOPHAGOGASTRODUODENOSCOPY (EGD);  Surgeon: [name], MD;  Location: [facility] ENDOSCOPY;  Service: Endoscopy;  Laterality: N/A;   ESOPHAGOSCOPY N/A 08/15/2012   Procedure: ESOPHAGOSCOPY;  Surgeon: [name], MD;  Location: [facility];  Service: ENT;  Laterality: N/A;   FOREIGN BODY REMOVAL ESOPHAGEAL N/A 08/15/2012   Procedure: REMOVAL FOREIGN BODY ESOPHAGEAL;  Surgeon: [name], MD;  Location: [facility];  Service: ENT;  Laterality: N/A;   INSERT / REPLACE / REMOVE PACEMAKER     MASTECTOMY Right 1970   Cancer   SAVORY DILATION N/A 10/03/2012   Procedure: SAVORY DILATION;  Surgeon: [name], MD;  Location: [facility] ENDOSCOPY;  Service: Endoscopy;  Laterality: N/A;    Allergies  Allergen Reactions   Augmentin  [Amoxicillin-Pot Clavulanate] Nausea And Vomiting   Lactose Intolerance (Gi)    Shingrix [Zoster Vac Recomb Adjuvanted] Rash    Outpatient Encounter Medications as of 08/20/2019  Medication Sig   acetaminophen (TYLENOL) 650 MG CR tablet Take 650 mg by mouth 2 (two) times daily. For pain and every 4 hours as needed for up to 72 hours, document area of discomfort and effectiveness or medication   albuterol (VENTOLIN HFA) 108 (90 Base) MCG/ACT inhaler Inhale 2 puffs into the lungs every 6 (six) hours as needed for wheezing or shortness of breath.   carboxymethylcellulose (REFRESH PLUS) 0.5 % SOLN 2 drops 2 (two) times daily.    Dentifrices (BIOTENE DRY MOUTH CARE DT) Take 1 spray by mouth 3 (three) times daily as needed.    Docosanol (ABREVA) 10 % CREA Apply 1 application topically.   escitalopram (LEXAPRO) 20 MG tablet Take 20 mg by mouth daily. For depression/ anxiety.   furosemide (LASIX) 40 MG tablet Take 40 mg by mouth daily.    GLUCERNA (GLUCERNA) LIQD Take 1 Can by mouth daily. Divided 1/2 can with breakfast and 1/2 can at lunch   levothyroxine (SYNTHROID) 75 MCG tablet Take 75 mcg by mouth daily before breakfast.  liver oil-zinc oxide (DESITIN) 40 % ointment Apply 1 application topically as needed for irritation (to buttocks and inner thighs).    loperamide (IMODIUM A-D) 2 MG tablet Take 2 mg by mouth 4 (four) times daily as needed for diarrhea or loose stools. 1 tablet after each loose stool. Do not exceed 4 caps in 24 hours as needed   LORazepam (ATIVAN) 0.5 MG tablet Take 0.5 tablets (0.25 mg total) by mouth 2 (two) times daily. (Patient taking differently: Take 0.25 mg by mouth at bedtime. )   LORazepam (ATIVAN) 0.5 MG tablet Take 1 tablet (0.5 mg total) by mouth 3 (three) times daily as needed for anxiety.   losartan (COZAAR) 25 MG tablet Take 25 mg by mouth daily.   metoprolol tartrate (LOPRESSOR) 25 MG tablet Take 25 mg by mouth 2 (two) times daily.   omeprazole  (PRILOSEC) 20 MG capsule Take 20 mg by mouth daily.   potassium chloride SA (K-DUR,KLOR-CON) 20 MEQ tablet Take 20 mEq by mouth daily.   QUEtiapine (SEROQUEL) 25 MG tablet Take 12.5 mg by mouth 2 (two) times daily.   [place] 14 % LIQD Apply topically as needed (for hemorrhoid flare up).   No facility-administered encounter medications on file as of 08/20/2019.    Review of Systems  Constitutional: Negative for activity change, appetite change, chills, diaphoresis, fatigue, fever and unexpected weight change.  HENT: Negative for congestion.   Respiratory: Negative for cough, shortness of breath and wheezing.   Cardiovascular: Positive for leg swelling. Negative for chest pain and palpitations.  Gastrointestinal: Negative for abdominal distention, abdominal pain, constipation and diarrhea.  Genitourinary: Negative for difficulty urinating and dysuria.  Musculoskeletal: Positive for gait problem. Negative for arthralgias, back pain, joint swelling and myalgias.  Neurological: Positive for weakness. Negative for dizziness, tremors, seizures, syncope, facial asymmetry, speech difficulty, light-headedness, numbness and headaches.  Psychiatric/Behavioral: Positive for agitation, behavioral problems and confusion. Negative for self-injury, sleep disturbance and suicidal ideas. The patient is nervous/anxious.     Immunization History  Administered Date(s) Administered   Influenza Inj Mdck Quad Pf 12/22/2015   Influenza Whole 12/17/2012   Influenza, High Dose Seasonal PF 12/18/2018   Influenza,inj,Quad PF,6+ Mos 12/24/2017   Influenza-Unspecified 12/08/2013, 12/16/2014, 12/27/2016   Moderna SARS-COVID-2 Vaccination 03/10/2019, 04/07/2019   PPD Test 07/18/2012   Pneumococcal Conjugate-13 08/27/2015   Pertinent  Health Maintenance Due  Topic Date Due   FOOT EXAM  07/14/2019   OPHTHALMOLOGY EXAM  09/22/2019   INFLUENZA VACCINE  10/04/2019   PNA vac Low Risk Adult  Completed     Fall Risk  06/08/2019 01/02/2019 07/14/2018 04/08/2018 07/10/2017  Falls in the past year? 0 0 0 Exclusion - non ambulatory No  Number falls in past yr: 0 0 0 - -  Injury with Fall? 0 0 0 - -  Risk for fall due to : Impaired mobility;Impaired balance/gait Impaired balance/gait Impaired balance/gait - -  Follow up Falls evaluation completed Falls evaluation completed - - -   Functional Status Survey:    There were no vitals filed for this visit. There is no height or weight on file to calculate BMI. Physical Exam Vitals and nursing note reviewed.  Constitutional:      Appearance: Normal appearance.  Cardiovascular:     Rate and Rhythm: Normal rate and regular rhythm.  Pulmonary:     Effort: Pulmonary effort is normal.     Breath sounds: Normal breath sounds.  Abdominal:     General: Bowel sounds are  normal. There is no distension.     Palpations: Abdomen is soft.     Tenderness: There is no abdominal tenderness.  Neurological:     Mental Status: She is alert.     Comments: Oriented x 2. Right sided weakness unchanged   Psychiatric:        Mood and Affect: Mood normal.     Labs reviewed: No results for input(s): NA, K, CL, CO2, GLUCOSE, BUN, CREATININE, CALCIUM, MG, PHOS in the last 8760 hours. No results for input(s): AST, ALT, ALKPHOS, BILITOT, PROT, ALBUMIN in the last 8760 hours. No results for input(s): WBC, NEUTROABS, HGB, HCT, MCV, PLT in the last 8760 hours. Lab Results  Component Value Date   TSH 1.32 10/07/2018   Lab Results  Component Value Date   HGBA1C 7.4 05/07/2019   Lab Results  Component Value Date   CHOL 135 09/19/2015   HDL 50 09/19/2015   LDLCALC 55 09/19/2015   TRIG 149 09/19/2015   CHOLHDL 4.1 07/01/2012    Significant Diagnostic Results in last 30 days:  No results found.  Assessment/Plan 1. Vascular dementia with behavior disturbance ([place]) Continues with periods of disorientation which are causing increased anxiety. She was started on  Seroquel 10 days ago at a low dose. I do not think that this is causing her symptoms to be worse and I discussed this with [place]. We will continue the low dose seroquel for now and consider a dose increase if there is no improvement. (need more time to work).  During the interim period I have increase the prn ativan from 0.25 to 0.5 mg tid prn x 14 days. Her daughter does not want her to be oversedated but comfortable and safe. We are trying to help improve her mood without causing excessive sedation.   2. Situational depression She continues to have periods of depression related to what she describes as family issues. Her symptoms tend to be more anxiety/disorientation related and so we will continue to work at those symptoms and leave the Lexapro dose the same for now.   Family/ staff Communication: discussed with her daughter [name]  Labs/tests ordered:  NA

## 2019-09-04 ENCOUNTER — Ambulatory Visit (INDEPENDENT_AMBULATORY_CARE_PROVIDER_SITE_OTHER): Payer: Medicare Other | Admitting: *Deleted

## 2019-09-04 DIAGNOSIS — I442 Atrioventricular block, complete: Secondary | ICD-10-CM | POA: Diagnosis not present

## 2019-09-07 LAB — CUP PACEART REMOTE DEVICE CHECK
Battery Impedance: 2059 Ohm
Battery Remaining Longevity: 30 mo
Battery Voltage: 2.75 V
Brady Statistic AP VP Percent: 0 %
Brady Statistic AP VS Percent: 0 %
Brady Statistic AS VP Percent: 99 %
Brady Statistic AS VS Percent: 0 %
Date Time Interrogation Session: 20210702144227
Implantable Lead Implant Date: 20110506
Implantable Lead Implant Date: 20110506
Implantable Lead Location: 753859
Implantable Lead Location: 753860
Implantable Lead Model: 5076
Implantable Lead Model: 5076
Implantable Pulse Generator Implant Date: 20110506
Lead Channel Impedance Value: 412 Ohm
Lead Channel Impedance Value: 476 Ohm
Lead Channel Pacing Threshold Amplitude: 0.875 V
Lead Channel Pacing Threshold Amplitude: 1 V
Lead Channel Pacing Threshold Pulse Width: 0.4 ms
Lead Channel Pacing Threshold Pulse Width: 0.4 ms
Lead Channel Setting Pacing Amplitude: 2 V
Lead Channel Setting Pacing Amplitude: 2.5 V
Lead Channel Setting Pacing Pulse Width: 0.4 ms
Lead Channel Setting Sensing Sensitivity: 2.8 mV

## 2019-09-08 NOTE — Progress Notes (Signed)
Remote pacemaker transmission.   

## 2019-09-09 ENCOUNTER — Encounter: Payer: Medicare Other | Admitting: Internal Medicine

## 2019-09-11 ENCOUNTER — Telehealth: Payer: Self-pay | Admitting: *Deleted

## 2019-09-11 NOTE — Telephone Encounter (Signed)
Spoke with patient's daughter, Katharine Look [facility]). Pt resides at [facility] in skilled nursing care and has significant dementia. Receiving palliative care. Katharine Look is concerned about remaining battery longevity. Explained that as of 09/04/19 transmission, battery estimate is at 30 months remaining. Explained device function once ERI is reached. Pt's daughter verbalizes understanding and appreciation of explanation. Plan to continue scheduled remote transmissions.

## 2019-09-14 ENCOUNTER — Non-Acute Institutional Stay (SKILLED_NURSING_FACILITY): Payer: Medicare Other | Admitting: Adult Health

## 2019-09-14 DIAGNOSIS — F01518 Vascular dementia, unspecified severity, with other behavioral disturbance: Secondary | ICD-10-CM

## 2019-09-14 DIAGNOSIS — E039 Hypothyroidism, unspecified: Secondary | ICD-10-CM

## 2019-09-14 DIAGNOSIS — I5032 Chronic diastolic (congestive) heart failure: Secondary | ICD-10-CM

## 2019-09-14 DIAGNOSIS — I442 Atrioventricular block, complete: Secondary | ICD-10-CM

## 2019-09-14 DIAGNOSIS — I872 Venous insufficiency (chronic) (peripheral): Secondary | ICD-10-CM | POA: Diagnosis not present

## 2019-09-14 DIAGNOSIS — F0151 Vascular dementia with behavioral disturbance: Secondary | ICD-10-CM

## 2019-09-14 DIAGNOSIS — K224 Dyskinesia of esophagus: Secondary | ICD-10-CM | POA: Diagnosis not present

## 2019-09-14 MED ORDER — LORAZEPAM 0.5 MG PO TABS
0.5000 mg | ORAL_TABLET | Freq: Two times a day (BID) | ORAL | 1 refills | Status: DC
Start: 2019-09-14 — End: 2019-10-27

## 2019-09-14 MED ORDER — LORAZEPAM 0.5 MG PO TABS: 0.5000 mg | ORAL_TABLET | Freq: Two times a day (BID) | ORAL | 0 refills | Status: AC | PRN

## 2019-09-16 ENCOUNTER — Encounter: Payer: Self-pay | Admitting: Adult Health

## 2019-09-16 NOTE — Progress Notes (Signed)
Location:  Occupational psychologist of Service:  SNF (31) Provider:   Cindi Carbon, ANP [place] [phone]   [name], DO  Patient Care Team: [name], DO as PCP - General (Geriatric Medicine) Community, [facility] [name], Altamese Dilling, MD as Attending Physician (Gastroenterology) Garvin Fila, MD as Consulting Physician (Neurology) Julianne Handler, NP as Nurse Practitioner (Hospice and Palliative Medicine) Luberta Mutter, MD as Consulting Physician (Ophthalmology)  Extended Emergency Contact Information Primary Emergency Contact: [place] Address: [address]          [place], [place] [address] [name] of [place] Phone: [phone] Mobile Phone: [phone] Relation: Daughter  Code Status:  DNR Goals of care: Advanced Directive information Advanced Directives 07/07/2019  Does Patient Have a Medical Advance Directive? Yes  Type of Advance Directive Out of facility DNR (pink MOST or yellow form)  Does patient want to make changes to medical advance directive? No - Patient declined  Copy of [place] in Chart? -  Pre-existing out of facility DNR order (yellow form or pink MOST form) -     Chief Complaint  Patient presents with  . Medical Management of Chronic Issues    HPI:  Pt is a 86 y.o. female seen today for medical management of chronic diseases.   The nurse reports that the seroquel for increased confusion, delusions, and anxiety is helping but that she continues to have some periods of anxiety after lunch. Her daughter wants her to be comfortable. Nurse is requesting to schedule the ativan that she uses as needed.   She has a pacemaker due to heart block. Battery life has 30 months per notes from cardiology. Family will not replace or re new due to goals of care  CHF: carries some chronic edema in her legs, no sob or doe, or pnd at this time.  Wt Readings from Last 3  Encounters:  09/16/19 172 lb 4.8 oz (78.2 kg)  08/10/19 174 lb (78.9 kg)  07/24/19 171 lb (77.6 kg)   Dysphagia/esophageal dysmotility: No reported issues with coughing or choking on her current diet.  Past Medical History:  Diagnosis Date  . Abnormality of gait 2007  . Acquired cyst of kidney 2002  . Acute sinus infection 11/25/2012  . Allergic rhinitis due to pollen   . Altered mental status 11/28/2012  . Anxiety state, unspecified   . Atrioventricular block, complete ([place]) 2003   s/p PPM 2003, generator chnage 2011  . Cardiac pacemaker in situ 2003  . Coronary atherosclerosis of native coronary artery 2003   non obstructive by Cath 2003  . CVA (cerebral infarction) 06/28/2012   rt hemiparesis, dysphagia  . Dendritic keratitis 2012  . Dysuria   . Edema 2003  . Esophageal dysmotility 08/19/2012  . Food impaction of esophagus 08/15/2012  . HTN (hypertension) 06/28/2012  . Hypertension   . Macular degeneration (senile) of retina, unspecified 2012  . Malignant neoplasm of breast (female), unspecified site 1970   S/P Rt radical mastectomy  . NSTEMI (non-ST elevated myocardial infarction) ([place]) 06/28/2012  . Osteoarthrosis, unspecified whether generalized or localized, unspecified site 2013  . Pacemaker   . Personal history of colonic polyps     s/p colonoscopy/polypectomy 2003  . Pure hyperglyceridemia 2004  . Senile osteoporosis 2003  . Stroke ([place]) 06/28/2012  . Tinnitus 2007  . Type II or unspecified type diabetes mellitus without mention of complication, not stated as uncontrolled 2003  .  Unspecified arthropathy, pelvic region and thigh 2010  . Unspecified glaucoma(365.9) 2013  . Unspecified hypothyroidism 2009  . Unspecified vitamin D deficiency 2009  . Urinary retention 07/07/2012   Past Surgical History:  Procedure Laterality Date  . BOTOX INJECTION N/A 10/03/2012   Procedure: BOTOX INJECTION;  Surgeon: [name], MD;  Location: [facility];  Service: Endoscopy;   Laterality: N/A;  . ESOPHAGOGASTRODUODENOSCOPY N/A 08/15/2012   Procedure: ESOPHAGOGASTRODUODENOSCOPY (EGD);  Surgeon: [name], MD;  [facility] ENDOSCOPY;  Service: Endoscopy;  Laterality: N/A;  . ESOPHAGOGASTRODUODENOSCOPY N/A 08/15/2012   Procedure: ESOPHAGOGASTRODUODENOSCOPY (EGD);  Surgeon: [name], MD;  Location: [place];  Service: Endoscopy;  Laterality: N/A;  . ESOPHAGOGASTRODUODENOSCOPY N/A 10/03/2012   Procedure: ESOPHAGOGASTRODUODENOSCOPY (EGD);  Surgeon: [name], MD;  Location: [facility] ENDOSCOPY;  Service: Endoscopy;  Laterality: N/A;  . ESOPHAGOGASTRODUODENOSCOPY N/A 12/22/2012   Procedure: ESOPHAGOGASTRODUODENOSCOPY (EGD);  Surgeon: [name], MD;  Location: [facility] END[facility]COPY;  Service: Endoscopy;  Laterality: N/A;  . ESOPHAGOSCOPY N/A 08/15/2012   Procedure: ESOPHAGOSCOPY;  Surgeon: [name], MD;  Location: [place];  Service: ENT;  Laterality: N/A;  . FOREIGN BODY REMOVAL ESOPHAGEAL N/A 08/15/2012   Procedure: REMOVAL FOREIGN BODY ESOPHAGEAL;  Surgeon: [name], MD;  Location: [place];  Service: ENT;  Laterality: N/A;  . INSERT / REPLACE / REMOVE PACEMAKER    . MASTECTOMY Right 1970   Cancer  . SAVORY DILATION N/A 10/03/2012   Procedure: SAVORY DILATION;  Surgeon: [name], MD;  [facility]: [facility]OS[facility];  Service: Endoscopy;  Laterality: N/A;    Allergies  Allergen Reactions  . Augmentin [Amoxicillin-Pot Clavulanate] Nausea And Vomiting  . Lactose Intolerance (Gi)   . Shingrix [Zoster Vac Recomb Adjuvanted] Rash    Outpatient Encounter Medications as of 09/14/2019  Medication Sig  . [DISCONTINUED] LORazepam (ATIVAN) 0.5 MG tablet Take 0.5 tablets (0.25 mg total) by mouth 2 (two) times daily.  . [DISCONTINUED] LORazepam (ATIVAN) 0.5 MG tablet Take 1 tablet (0.5 mg total) by mouth 3 (three) times daily as needed for anxiety.  Marland Kitchen acetaminophen (TYLENOL) 650 MG CR tablet Take 650 mg by mouth 2 (two) times daily. For pain and every 4 hours as needed for up to 72 hours,  document area of discomfort and effectiveness or medication  . albuterol (VENTOLIN HFA) 108 (90 Base) MCG/ACT inhaler Inhale 2 puffs into the lungs every 6 (six) hours as needed for wheezing or shortness of breath.  . carboxymethylcellulose (REFRESH PLUS) 0.5 % SOLN 2 drops 2 (two) times daily.   . Dentifrices (BIOTENE DRY MOUTH CARE DT) Take 1 spray by mouth 3 (three) times daily as needed.   . Docosanol (ABREVA) 10 % CREA Apply 1 application topically.  Marland Kitchen escitalopram (LEXAPRO) 20 MG tablet Take 20 mg by mouth daily. For depression/ anxiety.  . furosemide (LASIX) 40 MG tablet Take 40 mg by mouth daily.   Marland Kitchen GLUCERNA (GLUCERNA) LIQD Take 1 Can by mouth daily. Divided 1/2 can with breakfast and 1/2 can at lunch  . levothyroxine (SYNTHROID) 75 MCG tablet Take 75 mcg by mouth daily before breakfast.  . liver oil-zinc oxide (DESITIN) 40 % ointment Apply 1 application topically as needed for irritation (to buttocks and inner thighs).   . loperamide (IMODIUM A-D) 2 MG tablet Take 2 mg by mouth 4 (four) times daily as needed for diarrhea or loose stools. 1 tablet after each loose stool. Do not exceed 4 caps in 24 hours as needed  . LORazepam (  ATIVAN) 0.5 MG tablet Take 1 tablet (0.5 mg total) by mouth 2 (two) times daily.  Marland Kitchen LORazepam (ATIVAN) 0.5 MG tablet Take 1 tablet (0.5 mg total) by mouth 2 (two) times daily as needed for anxiety.  Marland Kitchen losartan (COZAAR) 25 MG tablet Take 25 mg by mouth daily.  . metoprolol tartrate (LOPRESSOR) 25 MG tablet Take 25 mg by mouth 2 (two) times daily.  Marland Kitchen omeprazole (PRILOSEC) 20 MG capsule Take 20 mg by mouth daily.  . potassium chloride SA (K-DUR,KLOR-CON) 20 MEQ tablet Take 20 mEq by mouth daily.  . QUEtiapine (SEROQUEL) 25 MG tablet Take 25 mg by mouth 2 (two) times daily.   Addison Lank Hazel 14 % LIQD Apply topically as needed (for hemorrhoid flare up).   No facility-administered encounter medications on file as of 09/14/2019.    Review of Systems  Constitutional:  Negative for activity change, appetite change, chills, diaphoresis, fatigue, fever and unexpected weight change.  HENT: Negative for congestion.   Respiratory: Negative for cough, shortness of breath and wheezing.   Cardiovascular: Positive for leg swelling. Negative for chest pain and palpitations.  Gastrointestinal: Negative for abdominal distention, abdominal pain, constipation and diarrhea.  Genitourinary: Negative for difficulty urinating and dysuria.  Musculoskeletal: Positive for gait problem. Negative for arthralgias, back pain, joint swelling and myalgias.  Neurological: Positive for weakness. Negative for dizziness, tremors, seizures, syncope, facial asymmetry, speech difficulty, light-headedness, numbness and headaches.  Psychiatric/Behavioral: Positive for agitation, behavioral problems and confusion. Negative for self-injury, sleep disturbance and suicidal ideas. The patient is nervous/anxious.     Immunization History  Administered Date(s) Administered  . Influenza Inj Mdck Quad Pf 12/22/2015  . Influenza Whole 12/17/2012  . Influenza, High Dose Seasonal PF 12/18/2018  . Influenza,inj,Quad PF,6+ Mos 12/24/2017  . Influenza-Unspecified 12/08/2013, 12/16/2014, 12/27/2016  . Moderna SARS-COVID-2 Vaccination 03/10/2019, 04/07/2019  . PPD Test 07/18/2012  . Pneumococcal Conjugate-13 08/27/2015   Pertinent  Health Maintenance Due  Topic Date Due  . FOOT EXAM  07/14/2019  . OPHTHALMOLOGY EXAM  09/22/2019  . INFLUENZA VACCINE  10/04/2019  . PNA vac Low Risk Adult  Completed   Fall Risk  06/08/2019 01/02/2019 07/14/2018 04/08/2018 07/10/2017  Falls in the past year? 0 0 0 Exclusion - non ambulatory No  Number falls in past yr: 0 0 0 - -  Injury with Fall? 0 0 0 - -  Risk for fall due to : Impaired mobility;Impaired balance/gait Impaired balance/gait Impaired balance/gait - -  Follow up Falls evaluation completed Falls evaluation completed - - -   Functional Status Survey:     Vitals:   09/16/19 0749  Weight: 172 lb 4.8 oz (78.2 kg)   Body mass index is 32.56 kg/m. Physical Exam Vitals and nursing note reviewed.  Constitutional:      Appearance: Normal appearance.  Cardiovascular:     Rate and Rhythm: Normal rate and regular rhythm.  Pulmonary:     Effort: Pulmonary effort is normal.     Breath sounds: Normal breath sounds.  Abdominal:     General: Bowel sounds are normal. There is no distension.     Palpations: Abdomen is soft.     Tenderness: There is no abdominal tenderness.  Musculoskeletal:     Right lower leg: Edema present.     Left lower leg: Edema present.  Skin:    General: Skin is warm and dry.  Neurological:     Mental Status: She is alert.     Comments: Oriented x 2.  Right sided weakness unchanged   Psychiatric:        Mood and Affect: Mood normal.     Labs reviewed: No results for input(s): NA, K, CL, CO2, GLUCOSE, BUN, CREATININE, CALCIUM, MG, PHOS in the last 8760 hours. No results for input(s): AST, ALT, ALKPHOS, BILITOT, PROT, ALBUMIN in the last 8760 hours. No results for input(s): WBC, NEUTROABS, HGB, HCT, MCV, PLT in the last 8760 hours. Lab Results  Component Value Date   TSH 1.32 10/07/2018   Lab Results  Component Value Date   HGBA1C 7.4 05/07/2019   Lab Results  Component Value Date   CHOL 135 09/19/2015   HDL 50 09/19/2015   LDLCALC 55 09/19/2015   TRIG 149 09/19/2015   CHOLHDL 4.1 07/01/2012    Significant Diagnostic Results in last 30 days:  CUP PACEART REMOTE DEVICE CHECK  Result Date: 09/07/2019 Scheduled remote reviewed. Normal device function.  Next remote 91 days.   Assessment/Plan  1. Vascular dementia with behavior disturbance (HCC) Start Ativan 0.5 mg bid and 0.5 mg bid prn  Continue seroquel 25 mg bid as it seems to be helping   2. Acquired hypothyroidism Lab Results  Component Value Date   TSH 1.32 10/07/2018  Continue Synthroid 75 mcg qd    3. Esophageal dysmotility Continue  puree diet with asp prec in place. Pt allowed to have chocolate or crackers per waiver signed.   4. Chronic diastolic congestive heart failure (HCC) Appears euvolemic Continue arb therapy and lasix 40 mg qd   5. Chronic venous insufficiency Has some edema in legs that is unchanged. Does not want to wear compression hose  6. Atrioventricular block, complete (HCC) Noted with battery life 30 months    Family/ staff Communication: nurse  Labs/tests ordered:  NA

## 2019-09-22 DIAGNOSIS — E1159 Type 2 diabetes mellitus with other circulatory complications: Secondary | ICD-10-CM | POA: Diagnosis not present

## 2019-09-22 DIAGNOSIS — L602 Onychogryphosis: Secondary | ICD-10-CM | POA: Diagnosis not present

## 2019-09-22 DIAGNOSIS — L84 Corns and callosities: Secondary | ICD-10-CM | POA: Diagnosis not present

## 2019-09-23 ENCOUNTER — Non-Acute Institutional Stay (SKILLED_NURSING_FACILITY): Payer: Medicare Other | Admitting: Internal Medicine

## 2019-09-23 ENCOUNTER — Encounter: Payer: Self-pay | Admitting: Internal Medicine

## 2019-09-23 DIAGNOSIS — M21921 Unspecified acquired deformity of right upper arm: Secondary | ICD-10-CM

## 2019-09-23 DIAGNOSIS — F01518 Vascular dementia, unspecified severity, with other behavioral disturbance: Secondary | ICD-10-CM

## 2019-09-23 DIAGNOSIS — M25511 Pain in right shoulder: Secondary | ICD-10-CM | POA: Diagnosis not present

## 2019-09-23 DIAGNOSIS — I890 Lymphedema, not elsewhere classified: Secondary | ICD-10-CM

## 2019-09-23 DIAGNOSIS — M25521 Pain in right elbow: Secondary | ICD-10-CM | POA: Diagnosis not present

## 2019-09-23 DIAGNOSIS — F0151 Vascular dementia with behavioral disturbance: Secondary | ICD-10-CM

## 2019-09-23 NOTE — Progress Notes (Signed)
Location:  Occupational psychologist of Service:  SNF (31) Provider:  Julieann Drummonds L. Mariea Clonts, D.O., C.M.D.  [name], DO  Patient Care Team: [name], DO as PCP - General (Geriatric Medicine) Community, Well [name], Altamese Dilling, MD as Attending Physician (Gastroenterology) Garvin Fila, MD as Consulting Physician (Neurology) Julianne Handler, NP as Nurse Practitioner (Hospice and Palliative Medicine) Luberta Mutter, MD as Consulting Physician (Ophthalmology)  Extended Emergency Contact Information Primary Emergency Contact: [place] Address: [address]          [place], [place] [address] [name] of [place] Phone: [phone] Mobile Phone: [phone] Relation: Daughter  Code Status:  DNR, palliative Goals of care: Advanced Directive information Advanced Directives 07/07/2019  Does Patient Have a Medical Advance Directive? Yes  Type of Advance Directive Out of facility DNR (pink MOST or yellow form)  Does patient want to make changes to medical advance directive? No - Patient declined  Copy of [place] in Chart? -  Pre-existing out of facility DNR order (yellow form or pink MOST form) -     Chief Complaint  Patient presents with  . Acute Visit    possible right shoulder dislocation     HPI:  Pt is a 86 y.o. female seen today for an acute visit for right shoulder deformity and pain s/p hitting elbow on the door today while being moved in lift.  Nurse manager asked me to see Rachel Cooke due to increased pain, decreased ROM and deformity of right shoulder.  CNAs reported that she was being transferred today via lift and going through the doorway, she struck her right elbow.  She has had pain since then.    When seen, she did not recall events.    Past Medical History:  Diagnosis Date  . Abnormality of gait 2007  . Acquired cyst of kidney 2002  . Acute sinus infection 11/25/2012  . Allergic rhinitis due  to pollen   . Altered mental status 11/28/2012  . Anxiety state, unspecified   . Atrioventricular block, complete ([place]) 2003   s/p PPM 2003, generator chnage 2011  . Cardiac pacemaker in situ 2003  . Coronary atherosclerosis of native coronary artery 2003   non obstructive by Cath 2003  . CVA (cerebral infarction) 06/28/2012   rt hemiparesis, dysphagia  . Dendritic keratitis 2012  . Dysuria   . Edema 2003  . Esophageal dysmotility 08/19/2012  . Food impaction of esophagus 08/15/2012  . HTN (hypertension) 06/28/2012  . Hypertension   . Macular degeneration (senile) of retina, unspecified 2012  . Malignant neoplasm of breast (female), unspecified site 1970   S/P Rt radical mastectomy  . NSTEMI (non-ST elevated myocardial infarction) ([place]) 06/28/2012  . Osteoarthrosis, unspecified whether generalized or localized, unspecified site 2013  . Pacemaker   . Personal history of colonic polyps     s/p colonoscopy/polypectomy 2003  . Pure hyperglyceridemia 2004  . Senile osteoporosis 2003  . Stroke ([place]) 06/28/2012  . Tinnitus 2007  . Type II or unspecified type diabetes mellitus without mention of complication, not stated as uncontrolled 2003  . Unspecified arthropathy, pelvic region and thigh 2010  . Unspecified glaucoma(365.9) 2013  . Unspecified hypothyroidism 2009  . Unspecified vitamin D deficiency 2009  . Urinary retention 07/07/2012   Past Surgical History:  Procedure Laterality Date  . BOTOX INJECTION N/A 10/03/2012   Procedure: BOTOX INJECTION;  Surgeon: [name], MD;  [facility];  Service: Endoscopy;  Laterality: N/A;  . ESOPHAGOGASTRODUODENOSCOPY N/A 08/15/2012   Procedure: ESOPHAGOGASTRODUODENOSCOPY (EGD);  Surgeon: [name], MD;  Location: [facility] ENDOSCOPY;  Service: Endoscopy;  Laterality: N/A;  . ESOPHAGOGASTRODUODENOSCOPY N/A 08/15/2012   Procedure: ESOPHAGOGASTRODUODENOSCOPY (EGD);  Surgeon: [name], MD;  Location: [place];  Service: Endoscopy;  Laterality:  N/A;  . ESOPHAGOGASTRODUODENOSCOPY N/A 10/03/2012   Procedure: ESOPHAGOGASTRODUODENOSCOPY (EGD);  Surgeon: [name], MD;  Location: [facility];  Service: Endoscopy;  Laterality: N/A;  . ESOPHAGOGASTRODUODENOSCOPY N/A 12/22/2012   Procedure: ESOPHAGOGASTRODUODENOSCOPY (EGD);  Surgeon: [name], MD;  Location: [facility] ENDOSCOPY;  Service: Endoscopy;  Laterality: N/A;  . ESOPHAGOSCOPY N/A 08/15/2012   Procedure: ESOPHAGOSCOPY;  Surgeon: [name], MD;  Location: [place];  Service: ENT;  Laterality: N/A;  . FOREIGN BODY REMOVAL ESOPHAGEAL N/A 08/15/2012   Procedure: REMOVAL FOREIGN BODY ESOPHAGEAL;  Surgeon: [name], MD;  Location: [place];  Service: ENT;  Laterality: N/A;  . INSERT / REPLACE / REMOVE PACEMAKER    . MASTECTOMY Right 1970   Cancer  . SAVORY DILATION N/A 10/03/2012   Procedure: SAVORY DILATION;  Surgeon: [name], MD;  [facility];  Service: Endoscopy;  Laterality: N/A;    Allergies  Allergen Reactions  . Augmentin [Amoxicillin-Pot Clavulanate] Nausea And Vomiting  . Lactose Intolerance (Gi)   . Shingrix [Zoster Vac Recomb Adjuvanted] Rash    Outpatient Encounter Medications as of 09/23/2019  Medication Sig  . acetaminophen (TYLENOL) 650 MG CR tablet Take 650 mg by mouth 2 (two) times daily. For pain and every 4 hours as needed for up to 72 hours, document area of discomfort and effectiveness or medication  . albuterol (VENTOLIN HFA) 108 (90 Base) MCG/ACT inhaler Inhale 2 puffs into the lungs every 6 (six) hours as needed for wheezing or shortness of breath.  . carboxymethylcellulose (REFRESH PLUS) 0.5 % SOLN 2 drops 2 (two) times daily.   . Dentifrices (BIOTENE DRY MOUTH CARE DT) Take 1 spray by mouth 3 (three) times daily as needed.   . Docosanol (ABREVA) 10 % CREA Apply 1 application topically.  Marland Kitchen escitalopram (LEXAPRO) 20 MG tablet Take 20 mg by mouth daily. For depression/ anxiety.  . furosemide (LASIX) 40 MG tablet Take 40 mg by mouth daily.   Marland Kitchen  GLUCERNA (GLUCERNA) LIQD Take 1 Can by mouth daily. Divided 1/2 can with breakfast and 1/2 can at lunch  . levothyroxine (SYNTHROID) 75 MCG tablet Take 75 mcg by mouth daily before breakfast.  . liver oil-zinc oxide (DESITIN) 40 % ointment Apply 1 application topically as needed for irritation (to buttocks and inner thighs).   . loperamide (IMODIUM A-D) 2 MG tablet Take 2 mg by mouth 4 (four) times daily as needed for diarrhea or loose stools. 1 tablet after each loose stool. Do not exceed 4 caps in 24 hours as needed  . LORazepam (ATIVAN) 0.5 MG tablet Take 1 tablet (0.5 mg total) by mouth 2 (two) times daily.  Marland Kitchen LORazepam (ATIVAN) 0.5 MG tablet Take 1 tablet (0.5 mg total) by mouth 2 (two) times daily as needed for anxiety.  Marland Kitchen losartan (COZAAR) 25 MG tablet Take 25 mg by mouth daily.  . metoprolol tartrate (LOPRESSOR) 25 MG tablet Take 25 mg by mouth 2 (two) times daily.  Marland Kitchen omeprazole (PRILOSEC) 20 MG capsule Take 20 mg by mouth daily.  . potassium chloride SA (K-DUR,KLOR-CON) 20 MEQ tablet Take 20 mEq by mouth daily.  . QUEtiapine (SEROQUEL) 25 MG tablet Take 25 mg  by mouth 2 (two) times daily.   Addison Lank Hazel 14 % LIQD Apply topically as needed (for hemorrhoid flare up).   No facility-administered encounter medications on file as of 09/23/2019.    Review of Systems  Constitutional: Positive for malaise/fatigue. Negative for chills and fever.  Musculoskeletal: Positive for joint pain. Negative for falls.       Right arm lymphedema  Psychiatric/Behavioral: Positive for depression and memory loss. The patient is nervous/anxious.     Immunization History  Administered Date(s) Administered  . Influenza Inj Mdck Quad Pf 12/22/2015  . Influenza Whole 12/17/2012  . Influenza, High Dose Seasonal PF 12/18/2018  . Influenza,inj,Quad PF,6+ Mos 12/24/2017  . Influenza-Unspecified 12/08/2013, 12/16/2014, 12/27/2016  . Moderna SARS-COVID-2 Vaccination 03/10/2019, 04/07/2019  . PPD Test 07/18/2012    . Pneumococcal Conjugate-13 08/27/2015   Pertinent  Health Maintenance Due  Topic Date Due  . FOOT EXAM  07/14/2019  . OPHTHALMOLOGY EXAM  09/22/2019  . INFLUENZA VACCINE  10/04/2019  . PNA vac Low Risk Adult  Completed   Fall Risk  06/08/2019 01/02/2019 07/14/2018 04/08/2018 07/10/2017  Falls in the past year? 0 0 0 Exclusion - non ambulatory No  Number falls in past yr: 0 0 0 - -  Injury with Fall? 0 0 0 - -  Risk for fall due to : Impaired mobility;Impaired balance/gait Impaired balance/gait Impaired balance/gait - -  Follow up Falls evaluation completed Falls evaluation completed - - -   Functional Status Survey:    Vitals:   09/23/19 1736  BP: 115/75  Pulse: 87  Resp: 20  Temp: 98.5 F (36.9 C)  SpO2: 93%   There is no height or weight on file to calculate BMI. Physical Exam Vitals reviewed.  Constitutional:      General: She is not in acute distress.    Appearance: Normal appearance.  Musculoskeletal:     Comments: She did report pain with ROM of the right greater than left shoulder.  She has chronic right arm lymphedema.  Her ROM is very limited in abduction, flexion.  She has pain posteriorly and the shoulder appears to be more anterior than the left and more prominent.  Pain does not seem severe enough for dislocation but will r/o with imaging.     Skin:    General: Skin is warm and dry.  Neurological:     Mental Status: She is alert.     Labs reviewed: No results for input(s): NA, K, CL, CO2, GLUCOSE, BUN, CREATININE, CALCIUM, MG, PHOS in the last 8760 hours. No results for input(s): AST, ALT, ALKPHOS, BILITOT, PROT, ALBUMIN in the last 8760 hours. No results for input(s): WBC, NEUTROABS, HGB, HCT, MCV, PLT in the last 8760 hours. Lab Results  Component Value Date   TSH 1.32 10/07/2018   Lab Results  Component Value Date   HGBA1C 7.4 05/07/2019   Lab Results  Component Value Date   CHOL 135 09/19/2015   HDL 50 09/19/2015   LDLCALC 55 09/19/2015   TRIG  149 09/19/2015   CHOLHDL 4.1 07/01/2012    Significant Diagnostic Results in last 30 days:  CUP PACEART REMOTE DEVICE CHECK  Result Date: 09/07/2019 Scheduled remote reviewed. Normal device function.  Next remote 91 days.   Assessment/Plan 1. Acquired deformity of right shoulder -3 views shoulder xrays and 2 views elbow  -if dislocated, may require reduction under sedation so hopefully not as transfer to ED is so likely to cause her considerable delirium  2. Lymphedema  of arm -chronic, cont current mgt  3. Vascular dementia with behavior disturbance ([place]) -progressively worse with short-term recall, orientation  Family/ staff Communication: discussed with nurse manager and nurse on unit  Labs/tests ordered:  xrays as above  Haydon Kalmar L. Derika Eckles, D.O. [facility] [address], [place] [address] Cell Phone (Mon-Fri 8am-5pm):  [phone] On Call:  [phone] & follow prompts after 5pm & weekends Office Phone:  [phone] Office Fax:  [phone]

## 2019-10-05 ENCOUNTER — Non-Acute Institutional Stay (SKILLED_NURSING_FACILITY): Payer: Medicare Other | Admitting: Adult Health

## 2019-10-05 ENCOUNTER — Encounter: Payer: Self-pay | Admitting: Adult Health

## 2019-10-05 DIAGNOSIS — R1310 Dysphagia, unspecified: Secondary | ICD-10-CM

## 2019-10-05 DIAGNOSIS — J9601 Acute respiratory failure with hypoxia: Secondary | ICD-10-CM

## 2019-10-05 DIAGNOSIS — Z9189 Other specified personal risk factors, not elsewhere classified: Secondary | ICD-10-CM | POA: Diagnosis not present

## 2019-10-05 DIAGNOSIS — R072 Precordial pain: Secondary | ICD-10-CM

## 2019-10-05 DIAGNOSIS — R1319 Other dysphagia: Secondary | ICD-10-CM

## 2019-10-05 MED ORDER — MORPHINE SULFATE (CONCENTRATE) 20 MG/ML PO SOLN: 5.0000 mg | ORAL | 0 refills | Status: AC | PRN

## 2019-10-05 NOTE — Progress Notes (Signed)
Location:  Occupational psychologist of Service:  SNF (31) Provider:   Cindi Carbon, ANP [place] [phone]  [name], DO  Patient Care Team: [name], DO as PCP - General (Geriatric Medicine) Community, [facility] [name], Altamese Dilling, MD as Attending Physician (Gastroenterology) Garvin Fila, MD as Consulting Physician (Neurology) Julianne Handler, NP as Nurse Practitioner (Hospice and Palliative Medicine) Luberta Mutter, MD as Consulting Physician (Ophthalmology)  Extended Emergency Contact Information Primary Emergency Contact: [place] Address: [address]          [place], [place] [address] [name] of [place] Phone: [phone] Mobile Phone: [phone] Relation: Daughter  Code Status:  DNR Goals of care: Advanced Directive information Advanced Directives 07/07/2019  Does Patient Have a Medical Advance Directive? Yes  Type of Advance Directive Out of facility DNR (pink MOST or yellow form)  Does patient want to make changes to medical advance directive? No - Patient declined  Copy of [place] in Chart? -  Pre-existing out of facility DNR order (yellow form or pink MOST form) -     Chief Complaint  Patient presents with  . Acute Visit    hypoxia, difficulty swallowing    HPI:  Pt is a 86 y.o. female seen today for an acute visit for hypoxia and difficulty swallowing. Ms. Perren has a hx of CVA with right sided weakness, esophageal dysmotility with impaction, vascular dementia (recently worsening), CHF, pacemaker, hypothyroidism, breast cancer, and NSTEMI. Her nurse called to report that she was having low oxygen in the 80's starting last evening (8/1), cough, lung congestion, and difficulty passing pills/food. Sats are now 90% on 3 liters. She reports mild shortness of breath and mild pain in the epigastric/chest area. No fever or nausea. No issues with constipation or  diarrhea. She has been vaccinated for covid and had the virus as well this year. The nurse reports when they give her pills, food, or liquids it just rolls back up. She is also making belching noise periodically. In 2014 she had an esophageal impaction and required endoscopy. She lived for years afterwards on a baby food diet. This impacted her QOL and over the past couple of years she has been eating pureed food and some crackers or chocolate. Her family signed a waiver to allow for this. Her goals of care are comfort based. The most form indicates no hospitalizations, DNR status, no IVF, and no antibiotics.    Past Medical History:  Diagnosis Date  . Abnormality of gait 2007  . Acquired cyst of kidney 2002  . Acute sinus infection 11/25/2012  . Allergic rhinitis due to pollen   . Altered mental status 11/28/2012  . Anxiety state, unspecified   . Atrioventricular block, complete ([place]) 2003   s/p PPM 2003, generator chnage 2011  . Cardiac pacemaker in situ 2003  . Coronary atherosclerosis of native coronary artery 2003   non obstructive by Cath 2003  . CVA (cerebral infarction) 06/28/2012   rt hemiparesis, dysphagia  . Dendritic keratitis 2012  . Dysuria   . Edema 2003  . Esophageal dysmotility 08/19/2012  . Food impaction of esophagus 08/15/2012  . HTN (hypertension) 06/28/2012  . Hypertension   . Macular degeneration (senile) of retina, unspecified 2012  . Malignant neoplasm of breast (female), unspecified site 1970   S/P Rt radical mastectomy  . NSTEMI (non-ST elevated myocardial infarction) ([place]) 06/28/2012  . Osteoarthrosis, unspecified whether generalized or localized, unspecified site  2013  . Pacemaker   . Personal history of colonic polyps     s/p colonoscopy/polypectomy 2003  . Pure hyperglyceridemia 2004  . Senile osteoporosis 2003  . Stroke ([place]) 06/28/2012  . Tinnitus 2007  . Type II or unspecified type diabetes mellitus without mention of complication, not stated as  uncontrolled 2003  . Unspecified arthropathy, pelvic region and thigh 2010  . Unspecified glaucoma(365.9) 2013  . Unspecified hypothyroidism 2009  . Unspecified vitamin D deficiency 2009  . Urinary retention 07/07/2012   Past Surgical History:  Procedure Laterality Date  . BOTOX INJECTION N/A 10/03/2012   Procedure: BOTOX INJECTION;  Surgeon: [name], MD;  [facility]: [facility];  Service: Endoscopy;  Laterality: N/A;  . ESOPHAGOGASTRODUODENOSCOPY N/A 08/15/2012   Procedure: ESOPHAGOGASTRODUODENOSCOPY (EGD);  Surgeon: [name], MD;  Location: [facility] ENDOSCOPY;  Service: Endoscopy;  Laterality: N/A;  . ESOPHAGOGASTRODUODENOSCOPY N/A 08/15/2012   Procedure: ESOPHAGOGASTRODUODENOSCOPY (EGD);  Surgeon: [name], MD;  Location: [place];  Service: Endoscopy;  Laterality: N/A;  . ESOPHAGOGASTRODUODENOSCOPY N/A 10/03/2012   Procedure: ESOPHAGOGASTRODUODENOSCOPY (EGD);  Surgeon: [name], MD;  Location: [facility];  Service: Endoscopy;  Laterality: N/A;  . ESOPHAGOGASTRODUODENOSCOPY N/A 12/22/2012   Procedure: ESOPHAGOGASTRODUODENOSCOPY (EGD);  Surgeon: [name], MD;  Location: [facility] ENDOSCOPY;  Service: Endoscopy;  Laterality: N/A;  . ESOPHAGOSCOPY N/A 08/15/2012   Procedure: ESOPHAGOSCOPY;  Surgeon: [name], MD;  Location: [place];  Service: ENT;  Laterality: N/A;  . FOREIGN BODY REMOVAL ESOPHAGEAL N/A 08/15/2012   Procedure: REMOVAL FOREIGN BODY ESOPHAGEAL;  Surgeon: [name], MD;  Location: [place];  Service: ENT;  Laterality: N/A;  . INSERT / REPLACE / REMOVE PACEMAKER    . MASTECTOMY Right 1970   Cancer  . SAVORY DILATION N/A 10/03/2012   Procedure: SAVORY DILATION;  Surgeon: [name], MD;  [facility];  Service: Endoscopy;  Laterality: N/A;    Allergies  Allergen Reactions  . Augmentin [Amoxicillin-Pot Clavulanate] Nausea And Vomiting  . Lactose Intolerance (Gi)   . Shingrix [Zoster Vac Recomb Adjuvanted] Rash    Outpatient Encounter Medications as of 10/05/2019    Medication Sig  . acetaminophen (TYLENOL) 650 MG CR tablet Take 650 mg by mouth 2 (two) times daily. For pain and every 4 hours as needed for up to 72 hours, document area of discomfort and effectiveness or medication  . albuterol (VENTOLIN HFA) 108 (90 Base) MCG/ACT inhaler Inhale 2 puffs into the lungs every 6 (six) hours as needed for wheezing or shortness of breath.  . carboxymethylcellulose (REFRESH PLUS) 0.5 % SOLN 2 drops 2 (two) times daily.   . Dentifrices (BIOTENE DRY MOUTH CARE DT) Take 1 spray by mouth 3 (three) times daily as needed.   . Docosanol (ABREVA) 10 % CREA Apply 1 application topically.  Marland Kitchen escitalopram (LEXAPRO) 20 MG tablet Take 20 mg by mouth daily. For depression/ anxiety.  . furosemide (LASIX) 40 MG tablet Take 40 mg by mouth daily.   Marland Kitchen GLUCERNA (GLUCERNA) LIQD Take 1 Can by mouth daily. Divided 1/2 can with breakfast and 1/2 can at lunch  . levothyroxine (SYNTHROID) 75 MCG tablet Take 75 mcg by mouth daily before breakfast.  . liver oil-zinc oxide (DESITIN) 40 % ointment Apply 1 application topically as needed for irritation (to buttocks and inner thighs).   . loperamide (IMODIUM A-D) 2 MG tablet Take 2 mg by mouth 4 (four) times daily as needed for diarrhea or loose stools. 1 tablet after each  loose stool. Do not exceed 4 caps in 24 hours as needed  . LORazepam (ATIVAN) 0.5 MG tablet Take 1 tablet (0.5 mg total) by mouth 2 (two) times daily.  Marland Kitchen LORazepam (ATIVAN) 0.5 MG tablet Take 1 tablet (0.5 mg total) by mouth 2 (two) times daily as needed for anxiety.  Marland Kitchen losartan (COZAAR) 25 MG tablet Take 25 mg by mouth daily.  . metoprolol tartrate (LOPRESSOR) 25 MG tablet Take 25 mg by mouth 2 (two) times daily.  Marland Kitchen omeprazole (PRILOSEC) 20 MG capsule Take 20 mg by mouth daily.  . potassium chloride SA (K-DUR,KLOR-CON) 20 MEQ tablet Take 20 mEq by mouth daily.  . QUEtiapine (SEROQUEL) 25 MG tablet Take 50 mg by mouth 2 (two) times daily.   Addison Lank Hazel 14 % LIQD Apply  topically as needed (for hemorrhoid flare up).   No facility-administered encounter medications on file as of 10/05/2019.    Review of Systems  Constitutional: Negative for activity change, appetite change, chills, diaphoresis, fatigue, fever and unexpected weight change.  HENT: Negative for congestion.   Respiratory: Positive for cough, shortness of breath (improved on oxygen) and wheezing (intermittent).   Cardiovascular: Positive for chest pain (substernal very mild per pt) and leg swelling. Negative for palpitations.  Gastrointestinal: Negative for abdominal distention, abdominal pain (epigastric, substernal mild), constipation and diarrhea.  Genitourinary: Negative for difficulty urinating and dysuria.  Musculoskeletal: Positive for arthralgias and gait problem. Negative for back pain, joint swelling and myalgias.  Neurological: Positive for weakness. Negative for dizziness, tremors, seizures, syncope, facial asymmetry, speech difficulty, light-headedness, numbness and headaches.  Psychiatric/Behavioral: Positive for agitation, behavioral problems and confusion.    Immunization History  Administered Date(s) Administered  . Influenza Inj Mdck Quad Pf 12/22/2015  . Influenza Whole 12/17/2012  . Influenza, High Dose Seasonal PF 12/18/2018  . Influenza,inj,Quad PF,6+ Mos 12/24/2017  . Influenza-Unspecified 12/08/2013, 12/16/2014, 12/27/2016  . Moderna SARS-COVID-2 Vaccination 03/10/2019, 04/07/2019  . PPD Test 07/18/2012  . Pneumococcal Conjugate-13 08/27/2015   Pertinent  Health Maintenance Due  Topic Date Due  . FOOT EXAM  07/14/2019  . OPHTHALMOLOGY EXAM  09/22/2019  . INFLUENZA VACCINE  10/04/2019  . PNA vac Low Risk Adult  Completed   Fall Risk  06/08/2019 01/02/2019 07/14/2018 04/08/2018 07/10/2017  Falls in the past year? 0 0 0 Exclusion - non ambulatory No  Number falls in past yr: 0 0 0 - -  Injury with Fall? 0 0 0 - -  Risk for fall due to : Impaired mobility;Impaired  balance/gait Impaired balance/gait Impaired balance/gait - -  Follow up Falls evaluation completed Falls evaluation completed - - -   Functional Status Survey:    Vitals:   10/05/19 1022  BP: (!) 145/79  Pulse: (!) 106  Resp: (!) 26  Temp: 98.3 F (36.8 C)  SpO2: 90%   There is no height or weight on file to calculate BMI. Physical Exam Vitals and nursing note reviewed.  Constitutional:      General: She is not in acute distress.    Appearance: She is not diaphoretic.  HENT:     Head: Normocephalic and atraumatic.     Nose: Nose normal. No congestion.     Mouth/Throat:     Mouth: Mucous membranes are moist.     Pharynx: Oropharynx is clear.     Comments: White patches to tongue Eyes:     Conjunctiva/sclera: Conjunctivae normal.     Pupils: Pupils are equal, round, and reactive to light.  Neck:     Vascular: No JVD.  Cardiovascular:     Rate and Rhythm: Regular rhythm. Tachycardia present.     Heart sounds: No murmur heard.   Pulmonary:     Effort: Pulmonary effort is normal. No respiratory distress.     Breath sounds: Rhonchi and rales present. No wheezing.  Abdominal:     General: Bowel sounds are normal. There is no distension.     Palpations: Abdomen is soft.     Tenderness: There is abdominal tenderness (epigastric).  Musculoskeletal:     Right lower leg: Edema (+1) present.     Left lower leg: Edema (trace) present.  Skin:    General: Skin is warm and dry.  Neurological:     Mental Status: She is alert.     Comments: Oriented to self and place. Right sided weakness  Psychiatric:        Mood and Affect: Mood normal.     Labs reviewed: No results for input(s): NA, K, CL, CO2, GLUCOSE, BUN, CREATININE, CALCIUM, MG, PHOS in the last 8760 hours. No results for input(s): AST, ALT, ALKPHOS, BILITOT, PROT, ALBUMIN in the last 8760 hours. No results for input(s): WBC, NEUTROABS, HGB, HCT, MCV, PLT in the last 8760 hours. Lab Results  Component Value Date    TSH 1.32 10/07/2018   Lab Results  Component Value Date   HGBA1C 7.4 05/07/2019   Lab Results  Component Value Date   CHOL 135 09/19/2015   HDL 50 09/19/2015   LDLCALC 55 09/19/2015   TRIG 149 09/19/2015   CHOLHDL 4.1 07/01/2012    Significant Diagnostic Results in last 30 days:  No results found.  Assessment/Plan 1. Acute respiratory failure with hypoxia (HCC) Appears more comfortable now that oxygen has been applied. Likely due to aspiration Continue oxygen as needed for sats >= 90% Place on enhanced isolation and check for Covid per [facility] protocol (low suspicion)  2. Esophageal dysphagia ? Possible food impaction.  Given her goals of care and decreased QOL would not send to the ER Hold food and pills, may give Ativan crushed with small amt of liquid under the tongue to avoid withdrawal.  Give Roxanol 5 mg q 4 hrs prn sob/pain Keep upright  3. Substernal chest pain Mild.  Unclear if this is due myocardial ischemia or esophageal impaction.  Nitro 0.4 mg SL q 5 min x 3 qd prn May use roxanol as above.     Family/ staff Communication: discussed with her 2nd [place] as her 1st POA is out of town   Labs/tests ordered:  Covid swab

## 2019-10-19 ENCOUNTER — Non-Acute Institutional Stay (SKILLED_NURSING_FACILITY): Payer: Medicare Other | Admitting: Adult Health

## 2019-10-19 ENCOUNTER — Encounter: Payer: Self-pay | Admitting: Adult Health

## 2019-10-19 DIAGNOSIS — K224 Dyskinesia of esophagus: Secondary | ICD-10-CM

## 2019-10-19 DIAGNOSIS — G4734 Idiopathic sleep related nonobstructive alveolar hypoventilation: Secondary | ICD-10-CM | POA: Diagnosis not present

## 2019-10-19 NOTE — Progress Notes (Signed)
Location:  Occupational psychologist of Service:  SNF (31) Provider:   Cindi Carbon, ANP [place] [phone]  [name], DO  Patient Care Team: [name], DO as PCP - General (Geriatric Medicine) Community, Well [name], Altamese Dilling, MD as Attending Physician (Gastroenterology) Garvin Fila, MD as Consulting Physician (Neurology) Julianne Handler, NP as Nurse Practitioner (Hospice and Palliative Medicine) Luberta Mutter, MD as Consulting Physician (Ophthalmology)  Extended Emergency Contact Information Primary Emergency Contact: [place] Address: [address]          [place], [place] [address] [name] of [place] Phone: [phone] Mobile Phone: [phone] Relation: Daughter  Code Status: DNR Goals of care: Advanced Directive information Advanced Directives 07/07/2019  Does Patient Have a Medical Advance Directive? Yes  Type of Advance Directive Out of facility DNR (pink MOST or yellow form)  Does patient want to make changes to medical advance directive? No - Patient declined  Copy of [place] in Chart? -  Pre-existing out of facility DNR order (yellow form or pink MOST form) -     Chief Complaint  Patient presents with  . Acute Visit    review meds    HPI:  Pt is a 86 y.o. female seen today for an acute visit for review of meds. Rachel Cooke has a hx of esophageal dysmotility, CVA with right sided weakness, and progress vascular dementia. Recently she has had s/s of aspiration and functional decline, also with worsening confusion. She is on oxygen at night but on room air during the day. She is not having a fever and cough. She is vaccinated for covid and was swabbed and negative for covid. We have trimmed her med list recently but her family is requesting another review. They want her kept comfortable with no meds that may prolong her life. They are questioning the use of  synthroid at this point. She does have difficulty swallowing and has her pills crushed.     Past Medical History:  Diagnosis Date  . Abnormality of gait 2007  . Acquired cyst of kidney 2002  . Acute sinus infection 11/25/2012  . Allergic rhinitis due to pollen   . Altered mental status 11/28/2012  . Anxiety state, unspecified   . Atrioventricular block, complete ([place]) 2003   s/p PPM 2003, generator chnage 2011  . Cardiac pacemaker in situ 2003  . Coronary atherosclerosis of native coronary artery 2003   non obstructive by Cath 2003  . CVA (cerebral infarction) 06/28/2012   rt hemiparesis, dysphagia  . Dendritic keratitis 2012  . Dysuria   . Edema 2003  . Esophageal dysmotility 08/19/2012  . Food impaction of esophagus 08/15/2012  . HTN (hypertension) 06/28/2012  . Hypertension   . Macular degeneration (senile) of retina, unspecified 2012  . Malignant neoplasm of breast (female), unspecified site 1970   S/P Rt radical mastectomy  . NSTEMI (non-ST elevated myocardial infarction) ([place]) 06/28/2012  . Osteoarthrosis, unspecified whether generalized or localized, unspecified site 2013  . Pacemaker   . Personal history of colonic polyps     s/p colonoscopy/polypectomy 2003  . Pure hyperglyceridemia 2004  . Senile osteoporosis 2003  . Stroke ([place]) 06/28/2012  . Tinnitus 2007  . Type II or unspecified type diabetes mellitus without mention of complication, not stated as uncontrolled 2003  . Unspecified arthropathy, pelvic region and thigh 2010  . Unspecified glaucoma(365.9) 2013  . Unspecified hypothyroidism 2009  . Unspecified vitamin D  deficiency 2009  . Urinary retention 07/07/2012   Past Surgical History:  Procedure Laterality Date  . BOTOX INJECTION N/A 10/03/2012   Procedure: BOTOX INJECTION;  Surgeon: [name], MD;  [facility];  Service: Endoscopy;  Laterality: N/A;  . ESOPHAGOGASTRODUODENOSCOPY N/A 08/15/2012   Procedure: ESOPHAGOGASTRODUODENOSCOPY (EGD);  Surgeon:  [name], MD;  Location: [facility] ENDOSCOPY;  Service: Endoscopy;  Laterality: N/A;  . ESOPHAGOGASTRODUODENOSCOPY N/A 08/15/2012   Procedure: ESOPHAGOGASTRODUODENOSCOPY (EGD);  Surgeon: [name], MD;  Location: [facility];  Service: Endoscopy;  Laterality: N/A;  . ESOPHAGOGASTRODUODENOSCOPY N/A 10/03/2012   Procedure: ESOPHAGOGASTRODUODENOSCOPY (EGD);  Surgeon: [name], MD;  Location: [facility];  Service: Endoscopy;  Laterality: N/A;  . ESOPHAGOGASTRODUODENOSCOPY N/A 12/22/2012   Procedure: ESOPHAGOGASTRODUODENOSCOPY (EGD);  Surgeon: [name], MD;  Location: [facility] ENDOSCOPY;  Service: Endoscopy;  Laterality: N/A;  . ESOPHAGOSCOPY N/A 08/15/2012   Procedure: ESOPHAGOSCOPY;  Surgeon: [name], MD;  Location: [facility];  Service: ENT;  Laterality: N/A;  . FOREIGN BODY REMOVAL ESOPHAGEAL N/A 08/15/2012   Procedure: REMOVAL FOREIGN BODY ESOPHAGEAL;  Surgeon: [name], MD;  Location: [facility];  Service: ENT;  Laterality: N/A;  . INSERT / REPLACE / REMOVE PACEMAKER    . MASTECTOMY Right 1970   Cancer  . SAVORY DILATION N/A 10/03/2012   Procedure: SAVORY DILATION;  Surgeon: [name], MD;  [facility];  Service: Endoscopy;  Laterality: N/A;    Allergies  Allergen Reactions  . Augmentin [Amoxicillin-Pot Clavulanate] Nausea And Vomiting  . Lactose Intolerance (Gi)   . Shingrix [Zoster Vac Recomb Adjuvanted] Rash    Outpatient Encounter Medications as of 10/19/2019  Medication Sig  . acetaminophen (TYLENOL) 650 MG CR tablet Take 650 mg by mouth 3 (three) times daily as needed.   Marland Kitchen albuterol (VENTOLIN HFA) 108 (90 Base) MCG/ACT inhaler Inhale 2 puffs into the lungs every 6 (six) hours as needed for wheezing or shortness of breath.  . carboxymethylcellulose (REFRESH PLUS) 0.5 % SOLN 2 drops 2 (two) times daily.   . Dentifrices (BIOTENE DRY MOUTH CARE DT) Take 1 spray by mouth 3 (three) times daily as needed.   . Docosanol (ABREVA) 10 % CREA Apply 1 application topically.  Marland Kitchen escitalopram  (LEXAPRO) 20 MG tablet Take 20 mg by mouth daily. For depression/ anxiety.  . furosemide (LASIX) 40 MG tablet Take 40 mg by mouth daily.   Marland Kitchen GLUCERNA (GLUCERNA) LIQD Take 1 Can by mouth daily. Divided 1/2 can with breakfast and 1/2 can at lunch  . liver oil-zinc oxide (DESITIN) 40 % ointment Apply 1 application topically as needed for irritation (to buttocks and inner thighs).   . loperamide (IMODIUM A-D) 2 MG tablet Take 2 mg by mouth 4 (four) times daily as needed for diarrhea or loose stools. 1 tablet after each loose stool. Do not exceed 4 caps in 24 hours as needed  . LORazepam (ATIVAN) 0.5 MG tablet Take 1 tablet (0.5 mg total) by mouth 2 (two) times daily.  Marland Kitchen LORazepam (ATIVAN) 0.5 MG tablet Take 1 tablet (0.5 mg total) by mouth 2 (two) times daily as needed for anxiety.  . metoprolol tartrate (LOPRESSOR) 25 MG tablet Take 12.5 mg by mouth 2 (two) times daily.   Marland Kitchen morphine (ROXANOL) 20 MG/ML concentrated solution Take 0.25 mLs (5 mg total) by mouth every 4 (four) hours as needed for severe pain.  Marland Kitchen omeprazole (PRILOSEC) 20 MG capsule Take 20 mg by mouth daily.  . potassium chloride  SA (K-DUR,KLOR-CON) 20 MEQ tablet Take 20 mEq by mouth daily.  . QUEtiapine (SEROQUEL) 25 MG tablet Take 50 mg by mouth 2 (two) times daily.   Addison Lank Hazel 14 % LIQD Apply topically as needed (for hemorrhoid flare up).  . [DISCONTINUED] levothyroxine (SYNTHROID) 75 MCG tablet Take 75 mcg by mouth daily before breakfast.  . [DISCONTINUED] losartan (COZAAR) 25 MG tablet Take 25 mg by mouth daily.   No facility-administered encounter medications on file as of 10/19/2019.    Review of Systems  Unable to perform ROS: Dementia    Immunization History  Administered Date(s) Administered  . Influenza Inj Mdck Quad Pf 12/22/2015  . Influenza Whole 12/17/2012  . Influenza, High Dose Seasonal PF 12/18/2018  . Influenza,inj,Quad PF,6+ Mos 12/24/2017  . Influenza-Unspecified 12/08/2013, 12/16/2014, 12/27/2016  .  Moderna SARS-COVID-2 Vaccination 03/10/2019, 04/07/2019  . PPD Test 07/18/2012  . Pneumococcal Conjugate-13 08/27/2015   Pertinent  Health Maintenance Due  Topic Date Due  . FOOT EXAM  07/14/2019  . OPHTHALMOLOGY EXAM  09/22/2019  . INFLUENZA VACCINE  10/04/2019  . PNA vac Low Risk Adult  Completed   Fall Risk  06/08/2019 01/02/2019 07/14/2018 04/08/2018 07/10/2017  Falls in the past year? 0 0 0 Exclusion - non ambulatory No  Number falls in past yr: 0 0 0 - -  Injury with Fall? 0 0 0 - -  Risk for fall due to : Impaired mobility;Impaired balance/gait Impaired balance/gait Impaired balance/gait - -  Follow up Falls evaluation completed Falls evaluation completed - - -   Functional Status Survey:    There were no vitals filed for this visit. There is no height or weight on file to calculate BMI. Physical Exam Vitals and nursing note reviewed.  Cardiovascular:     Rate and Rhythm: Normal rate and regular rhythm.  Pulmonary:     Effort: Pulmonary effort is normal.     Breath sounds: Rales present.  Abdominal:     General: Bowel sounds are normal. There is no distension.     Palpations: Abdomen is soft.  Skin:    General: Skin is warm and dry.  Neurological:     Mental Status: She is alert.     Comments: Oriented to self and place. Right sided weakness   Psychiatric:        Mood and Affect: Mood normal.     Labs reviewed: No results for input(s): NA, K, CL, CO2, GLUCOSE, BUN, CREATININE, CALCIUM, MG, PHOS in the last 8760 hours. No results for input(s): AST, ALT, ALKPHOS, BILITOT, PROT, ALBUMIN in the last 8760 hours. No results for input(s): WBC, NEUTROABS, HGB, HCT, MCV, PLT in the last 8760 hours. Lab Results  Component Value Date   TSH 1.32 10/07/2018   Lab Results  Component Value Date   HGBA1C 7.4 05/07/2019   Lab Results  Component Value Date   CHOL 135 09/19/2015   HDL 50 09/19/2015   LDLCALC 55 09/19/2015   TRIG 149 09/19/2015   CHOLHDL 4.1 07/01/2012     Significant Diagnostic Results in last 30 days:  No results found.  Assessment/Plan 1. Esophageal dysmotility Due to difficulty swallowing pills and family request to decrease pill burden will decrease metoprolol to 12.5 mg bid and d/c synthroid   2. Nocturnal hypoxia Continue oxygen nightly as needed for sats > or = 90%    Family/ staff Communication: discussed with her nurse Tammy  Labs/tests ordered: NA

## 2019-10-27 ENCOUNTER — Other Ambulatory Visit: Payer: Self-pay | Admitting: Internal Medicine

## 2019-10-27 MED ORDER — LORAZEPAM 0.5 MG PO TABS: 0.5000 mg | ORAL_TABLET | Freq: Two times a day (BID) | ORAL | 5 refills | Status: AC

## 2019-11-12 ENCOUNTER — Non-Acute Institutional Stay (SKILLED_NURSING_FACILITY): Payer: Medicare Other | Admitting: Adult Health

## 2019-11-12 ENCOUNTER — Encounter: Payer: Self-pay | Admitting: Adult Health

## 2019-11-12 DIAGNOSIS — I442 Atrioventricular block, complete: Secondary | ICD-10-CM

## 2019-11-12 DIAGNOSIS — I872 Venous insufficiency (chronic) (peripheral): Secondary | ICD-10-CM

## 2019-11-12 DIAGNOSIS — E039 Hypothyroidism, unspecified: Secondary | ICD-10-CM

## 2019-11-12 DIAGNOSIS — F0151 Vascular dementia with behavioral disturbance: Secondary | ICD-10-CM

## 2019-11-12 DIAGNOSIS — K224 Dyskinesia of esophagus: Secondary | ICD-10-CM

## 2019-11-12 DIAGNOSIS — I5032 Chronic diastolic (congestive) heart failure: Secondary | ICD-10-CM

## 2019-11-12 DIAGNOSIS — F01518 Vascular dementia, unspecified severity, with other behavioral disturbance: Secondary | ICD-10-CM

## 2019-11-12 DIAGNOSIS — I69351 Hemiplegia and hemiparesis following cerebral infarction affecting right dominant side: Secondary | ICD-10-CM

## 2019-11-12 DIAGNOSIS — F4321 Adjustment disorder with depressed mood: Secondary | ICD-10-CM

## 2019-11-12 NOTE — Progress Notes (Signed)
Location:  Occupational psychologist of Service:  SNF (31) Provider:   Cindi Carbon, ANP [place] [phone]   [name], DO  Patient Care Team: [name], DO as PCP - General (Geriatric Medicine) [facility] [name], Altamese Dilling, MD as Attending Physician (Gastroenterology) Garvin Fila, MD as Consulting Physician (Neurology) Julianne Handler, NP as Nurse Practitioner (Hospice and Palliative Medicine) Luberta Mutter, MD as Consulting Physician (Ophthalmology)  Extended Emergency Contact Information Primary Emergency Contact: [place] Address: [address]          [place], [place] [address] [name] of [place] Phone: [phone] Mobile Phone: [phone] Relation: Daughter  Code Status:  DNR Goals of care: Advanced Directive information Advanced Directives 07/07/2019  Does Patient Have a Medical Advance Directive? Yes  Type of Advance Directive Out of facility DNR (pink MOST or yellow form)  Does patient want to make changes to medical advance directive? No - Patient declined  Copy of [place] in Chart? -  Pre-existing out of facility DNR order (yellow form or pink MOST form) -     Chief Complaint  Patient presents with  . Medical Management of Chronic Issues    HPI:  Pt Rachel Cooke a 86 y.o. female seen today for medical management of chronic diseases.  Rachel Rachel Cooke followed by hospice for progressive vascular dementia, as well as esophageal dysmotility.  She Rachel Cooke at risk for aspiration and intermittently requires oxygen. She continues on a puree diet but also has other solid items for QOL.  She Rachel Cooke now off oxygen and doing well. recently her family asked for the lasix she Rachel Cooke on for CHF to be discontinued as she was urinating frequently and was having issues getting to the BR (bedpain vs. Stand up lift transfer).  She has not had any increase in edema or sob at this time. No change in  weight at the beginning of Sept but she Rachel Cooke not weighed daily due to her goals of care. She remains confused at baseline and seems to get disoriented and this results in agitation. For my visit she Rachel Cooke resting in her recliner and has no acute complaints.    She has used Ativan 2 x in the past 9 days for anxiety.  No prn use of Roxanol documented in the past 9 days.   Past Medical History:  Diagnosis Date  . Abnormality of gait 2007  . Acquired cyst of kidney 2002  . Acute sinus infection 11/25/2012  . Allergic rhinitis due to pollen   . Altered mental status 11/28/2012  . Anxiety state, unspecified   . Atrioventricular block, complete ([place]) 2003   s/p PPM 2003, generator chnage 2011  . Cardiac pacemaker in situ 2003  . Coronary atherosclerosis of native coronary artery 2003   non obstructive by Cath 2003  . CVA (cerebral infarction) 06/28/2012   rt hemiparesis, dysphagia  . Dendritic keratitis 2012  . Dysuria   . Edema 2003  . Esophageal dysmotility 08/19/2012  . Food impaction of esophagus 08/15/2012  . HTN (hypertension) 06/28/2012  . Hypertension   . Macular degeneration (senile) of retina, unspecified 2012  . Malignant neoplasm of breast (female), unspecified site 1970   S/P Rt radical mastectomy  . NSTEMI (non-ST elevated myocardial infarction) ([place]) 06/28/2012  . Osteoarthrosis, unspecified whether generalized or localized, unspecified site 2013  . Pacemaker   . Personal history of colonic polyps     s/p colonoscopy/polypectomy 2003  .  Pure hyperglyceridemia 2004  . Senile osteoporosis 2003  . Stroke ([place]) 06/28/2012  . Tinnitus 2007  . Type II or unspecified type diabetes mellitus without mention of complication, not stated as uncontrolled 2003  . Unspecified arthropathy, pelvic region and thigh 2010  . Unspecified glaucoma(365.9) 2013  . Unspecified hypothyroidism 2009  . Unspecified vitamin D deficiency 2009  . Urinary retention 07/07/2012   Past Surgical History:    Procedure Laterality Date  . BOTOX INJECTION N/A 10/03/2012   Procedure: BOTOX INJECTION;  Surgeon: [name], MD;  Location: [facility] ENDOSCOPY;  Service: Endoscopy;  Laterality: N/A;  . ESOPHAGOGASTRODUODENOSCOPY N/A 08/15/2012   Procedure: ESOPHAGOGASTRODUODENOSCOPY (EGD);  Surgeon: [name], MD;  Location: [facility] ENDOSCOPY;  Service: Endoscopy;  Laterality: N/A;  . ESOPHAGOGASTRODUODENOSCOPY N/A 08/15/2012   Procedure: ESOPHAGOGASTRODUODENOSCOPY (EGD);  Surgeon: [name], MD;  Location: [facility];  Service: Endoscopy;  Laterality: N/A;  . ESOPHAGOGASTRODUODENOSCOPY N/A 10/03/2012   Procedure: ESOPHAGOGASTRODUODENOSCOPY (EGD);  Surgeon: [name], MD;  Location: [facility]OSCOPY;  Service: Endoscopy;  Laterality: N/A;  . ESOPHAGOGASTRODUODENOSCOPY N/A 12/22/2012   Procedure: ESOPHAGOGASTRODUODENOSCOPY (EGD);  Surgeon: [name], MD;  Location: [facility] ENDOSCOPY;  Service: Endoscopy;  Laterality: N/A;  . ESOPHAGOSCOPY N/A 08/15/2012   Procedure: ESOPHAGOSCOPY;  Surgeon: [name], MD;  Location: [facility];  Service: ENT;  Laterality: N/A;  . FOREIGN BODY REMOVAL ESOPHAGEAL N/A 08/15/2012   Procedure: REMOVAL FOREIGN BODY ESOPHAGEAL;  Surgeon: [name], MD;  Location: [facility];  Service: ENT;  Laterality: N/A;  . INSERT / REPLACE / REMOVE PACEMAKER    . MASTECTOMY Right 1970   Cancer  . SAVORY DILATION N/A 10/03/2012   Procedure: SAVORY DILATION;  Surgeon: [name], MD;  Location: [facility] ENDOSCOPY;  Service: Endoscopy;  Laterality: N/A;    Allergies  Allergen Reactions  . Augmentin [Amoxicillin-Pot Clavulanate] Nausea And Vomiting  . Lactose Intolerance (Gi)   . Shingrix [Zoster Vac Recomb Adjuvanted] Rash    Outpatient Encounter Medications as of 11/12/2019  Medication Sig  . LORazepam (ATIVAN) 0.5 MG tablet Take 1 tablet (0.5 mg total) by mouth 2 (two) times daily as needed for anxiety.  Marland Kitchen LORazepam (ATIVAN) 0.5 MG tablet Take 1 tablet (0.5 mg total) by mouth 2 (two) times daily.  . metoprolol  tartrate (LOPRESSOR) 25 MG tablet Take 12.5 mg by mouth 2 (two) times daily.   Marland Kitchen morphine (ROXANOL) 20 MG/ML concentrated solution Take 0.25 mLs (5 mg total) by mouth every 4 (four) hours as needed for severe pain.  Marland Kitchen omeprazole (PRILOSEC) 20 MG capsule Take 20 mg by mouth daily.  . QUEtiapine (SEROQUEL) 25 MG tablet Take 50 mg by mouth 2 (two) times daily.   Marland Kitchen acetaminophen (TYLENOL) 650 MG CR tablet Take 650 mg by mouth 3 (three) times daily as needed.   Marland Kitchen albuterol (VENTOLIN HFA) 108 (90 Base) MCG/ACT inhaler Inhale 2 puffs into the lungs every 6 (six) hours as needed for wheezing or shortness of breath.  . carboxymethylcellulose (REFRESH PLUS) 0.5 % SOLN 2 drops 2 (two) times daily.   . Dentifrices (BIOTENE DRY MOUTH CARE DT) Take 1 spray by mouth 3 (three) times daily as needed.   . Docosanol (ABREVA) 10 % CREA Apply 1 application topically.  Marland Kitchen escitalopram (LEXAPRO) 20 MG tablet Take 20 mg by mouth daily. For depression/ anxiety.  Marland Kitchen GLUCERNA (GLUCERNA) LIQD Take 1 Can by mouth daily. Divided 1/2 can with breakfast and 1/2 can at lunch  . liver oil-zinc  oxide (DESITIN) 40 % ointment Apply 1 application topically as needed for irritation (to buttocks and inner thighs).   . loperamide (IMODIUM A-D) 2 MG tablet Take 2 mg by mouth 4 (four) times daily as needed for diarrhea or loose stools. 1 tablet after each loose stool. Do not exceed 4 caps in 24 hours as needed  . [place]el 14 % LIQD Apply topically as needed (for hemorrhoid flare up).  . [DISCONTINUED] furosemide (LASIX) 40 MG tablet Take 40 mg by mouth daily.   . [DISCONTINUED] potassium chloride SA (K-DUR,KLOR-CON) 20 MEQ tablet Take 20 mEq by mouth daily.   No facility-administered encounter medications on file as of 11/12/2019.    Review of Systems  Constitutional: Positive for activity change. Negative for appetite change, chills, diaphoresis, fatigue, fever and unexpected weight change.  HENT: Positive for trouble swallowing.  Negative for congestion.   Respiratory: Negative for cough, shortness of breath and wheezing.   Cardiovascular: Positive for leg swelling. Negative for chest pain and palpitations.  Gastrointestinal: Negative for abdominal distention, abdominal pain, constipation and diarrhea.  Genitourinary: Negative for difficulty urinating and dysuria.  Musculoskeletal: Positive for gait problem. Negative for arthralgias, back pain, joint swelling and myalgias.  Neurological: Positive for weakness. Negative for dizziness, tremors, seizures, syncope, facial asymmetry, speech difficulty, light-headedness, numbness and headaches.  Psychiatric/Behavioral: Positive for agitation, behavioral problems and confusion. Negative for sleep disturbance and suicidal ideas. The patient Rachel Cooke nervous/anxious.     Immunization History  Administered Date(s) Administered  . Influenza Inj Mdck Quad Pf 12/22/2015  . Influenza Whole 12/17/2012  . Influenza, High Dose Seasonal PF 12/18/2018  . Influenza,inj,Quad PF,6+ Mos 12/24/2017  . Influenza-Unspecified 12/08/2013, 12/16/2014, 12/27/2016  . Moderna SARS-COVID-2 Vaccination 03/10/2019, 04/07/2019  . PPD Test 07/18/2012  . Pneumococcal Conjugate-13 08/27/2015   Pertinent  Health Maintenance Due  Topic Date Due  . FOOT EXAM  07/14/2019  . OPHTHALMOLOGY EXAM  09/22/2019  . INFLUENZA VACCINE  10/04/2019  . PNA vac Low Risk Adult  Completed   Fall Risk  06/08/2019 01/02/2019 07/14/2018 04/08/2018 07/10/2017  Falls in the past year? 0 0 0 Exclusion - non ambulatory No  Number falls in past yr: 0 0 0 - -  Injury with Fall? 0 0 0 - -  Risk for fall due to : Impaired mobility;Impaired balance/gait Impaired balance/gait Impaired balance/gait - -  Follow up Falls evaluation completed Falls evaluation completed - - -   Functional Status Survey:    Vitals:   11/12/19 1640  Weight: 172 lb 4.8 oz (78.2 kg)   Body mass index Rachel Cooke 32.56 kg/m.  Wt Readings from Last 3 Encounters:    11/12/19 172 lb 4.8 oz (78.2 kg)  09/16/19 172 lb 4.8 oz (78.2 kg)  08/10/19 174 lb (78.9 kg)    Physical Exam Vitals and nursing note reviewed.  Constitutional:      General: She Rachel Cooke not in acute distress.    Appearance: She Rachel Cooke not diaphoretic.  HENT:     Head: Normocephalic and atraumatic.     Mouth/Throat:     Mouth: Mucous membranes are moist.     Pharynx: Oropharynx Rachel Cooke clear.  Neck:     Vascular: No JVD.  Cardiovascular:     Rate and Rhythm: Normal rate and regular rhythm.     Heart sounds: No murmur heard.   Pulmonary:     Effort: Pulmonary effort Rachel Cooke normal. No respiratory distress.     Breath sounds: Rales present. No wheezing.  Abdominal:  General: Bowel sounds are normal.     Palpations: Abdomen Rachel Cooke soft.  Musculoskeletal:     Right lower leg: Edema (+1) present.     Left lower leg: Edema (+1) present.  Skin:    General: Skin Rachel Cooke warm and dry.  Neurological:     Mental Status: She Rachel Cooke alert.     Comments: Oriented to self and place. Able to f/c.  Confused about the details of her care. Right sided weakness.      Labs reviewed: No results for input(s): NA, K, CL, CO2, GLUCOSE, BUN, CREATININE, CALCIUM, MG, PHOS in the last 8760 hours. No results for input(s): AST, ALT, ALKPHOS, BILITOT, PROT, ALBUMIN in the last 8760 hours. No results for input(s): WBC, NEUTROABS, HGB, HCT, MCV, PLT in the last 8760 hours. Lab Results  Component Value Date   TSH 1.32 10/07/2018   Lab Results  Component Value Date   HGBA1C 7.4 05/07/2019   Lab Results  Component Value Date   CHOL 135 09/19/2015   HDL 50 09/19/2015   LDLCALC 55 09/19/2015   TRIG 149 09/19/2015   CHOLHDL 4.1 07/01/2012    Significant Diagnostic Results in last 30 days:  No results found.  Assessment/Plan  1. Vascular dementia with behavior disturbance ([place]) Progressing over the past year with more disorientation/agitation Hospice care Continue ativan and roxanol as needed for comfort  2.  Esophageal dysmotility Continue puree diet with exceptions for QOL  3. Atrioventricular block, complete [facility]) Pacemaker (will not replace battery)  4. Chronic venous insufficiency Continue compression hose and elevation   5. Situational depression Continue Lexapro 20 mg qd   6. Hemiparesis affecting right side as late effect of cerebrovascular accident [facility]) Needs support to prevent subluxation Recommend elevation as able Not on preventative meds due to hospice status  7. Acquired hypothyroidism Not on supplementation to reduce pill burden per family request  8. CHF (chronic diastolic) So far she has done well without lasix. If she becomes overloaded and has SOB would recommend using roxanol or could give prn lasix.    Family/ staff Communication: discussed with her nurse  Labs/tests ordered:  NA

## 2019-12-04 ENCOUNTER — Ambulatory Visit (INDEPENDENT_AMBULATORY_CARE_PROVIDER_SITE_OTHER): Payer: Medicare Other

## 2019-12-04 DIAGNOSIS — I442 Atrioventricular block, complete: Secondary | ICD-10-CM | POA: Diagnosis not present

## 2019-12-09 LAB — CUP PACEART REMOTE DEVICE CHECK
Battery Impedance: 2219 Ohm
Battery Remaining Longevity: 28 mo
Battery Voltage: 2.74 V
Brady Statistic AP VP Percent: 0 %
Brady Statistic AP VS Percent: 0 %
Brady Statistic AS VP Percent: 99 %
Brady Statistic AS VS Percent: 0 %
Date Time Interrogation Session: 20211006111155
Implantable Lead Implant Date: 20110506
Implantable Lead Implant Date: 20110506
Implantable Lead Location: 753859
Implantable Lead Location: 753860
Implantable Lead Model: 5076
Implantable Lead Model: 5076
Implantable Pulse Generator Implant Date: 20110506
Lead Channel Impedance Value: 409 Ohm
Lead Channel Impedance Value: 491 Ohm
Lead Channel Pacing Threshold Amplitude: 0.875 V
Lead Channel Pacing Threshold Amplitude: 1.125 V
Lead Channel Pacing Threshold Pulse Width: 0.4 ms
Lead Channel Pacing Threshold Pulse Width: 0.4 ms
Lead Channel Setting Pacing Amplitude: 2 V
Lead Channel Setting Pacing Amplitude: 2.5 V
Lead Channel Setting Pacing Pulse Width: 0.4 ms
Lead Channel Setting Sensing Sensitivity: 2.8 mV

## 2019-12-14 ENCOUNTER — Non-Acute Institutional Stay (SKILLED_NURSING_FACILITY): Payer: Medicare Other | Admitting: Adult Health

## 2019-12-14 DIAGNOSIS — I872 Venous insufficiency (chronic) (peripheral): Secondary | ICD-10-CM | POA: Diagnosis not present

## 2019-12-14 DIAGNOSIS — I69351 Hemiplegia and hemiparesis following cerebral infarction affecting right dominant side: Secondary | ICD-10-CM | POA: Diagnosis not present

## 2019-12-14 DIAGNOSIS — K224 Dyskinesia of esophagus: Secondary | ICD-10-CM | POA: Diagnosis not present

## 2019-12-14 DIAGNOSIS — I5032 Chronic diastolic (congestive) heart failure: Secondary | ICD-10-CM | POA: Diagnosis not present

## 2019-12-14 DIAGNOSIS — I442 Atrioventricular block, complete: Secondary | ICD-10-CM

## 2019-12-14 DIAGNOSIS — F0150 Vascular dementia without behavioral disturbance: Secondary | ICD-10-CM

## 2019-12-17 ENCOUNTER — Encounter: Payer: Self-pay | Admitting: Adult Health

## 2019-12-17 NOTE — Progress Notes (Signed)
Location:  Occupational psychologist of Service:  SNF (31) Provider:   Cindi Carbon, ANP [place] [phone]   [name], DO  Patient Care Team: [name], DO as PCP - General (Geriatric Medicine) Community, [name], Altamese Dilling, MD as Attending Physician (Gastroenterology) Garvin Fila, MD as Consulting Physician (Neurology) Julianne Handler, NP as Nurse Practitioner (Hospice and Palliative Medicine) Luberta Mutter, MD as Consulting Physician (Ophthalmology)  Extended Emergency Contact Information Primary Emergency Contact: [place] Address: [address]          [place], [place] [address] [name] of [place] Phone: [phone] Mobile Phone: [phone] Relation: Daughter  Code Status:  DNR Goals of care: Advanced Directive information Advanced Directives 07/07/2019  Does Patient Have a Medical Advance Directive? Yes  Type of Advance Directive Out of facility DNR (pink MOST or yellow form)  Does patient want to make changes to medical advance directive? No - Patient declined  Copy of [place] in Chart? -  Pre-existing out of facility DNR order (yellow form or pink MOST form) -     Chief Complaint  Patient presents with   Medical Management of Chronic Issues    HPI:  Pt is a 86 y.o. female seen today for medical management of chronic diseases.  Ms. Tsui is followed by hospice for progressive vascular dementia, as well as esophageal dysmotility.  She has a pacemaker but is not having the battery changed when due to her goals of care. She was on lasix for CHF but this was discontinued per her family's request. She is not having any sob, weight gain, pnd, doe, or increased edema. She has periods of increased agitation and disorientation and needs frequent reminders. She continues on scheduled and prn ativan for this reason as well as seroquel for delusional thinking. There are  no acute complaints regarding her care for my visit. She has not needed any morphine for dyspnea. She does require oxygen due to low 02 sats at 2 liters. No reports of pain.    Past Medical History:  Diagnosis Date   Abnormality of gait 2007   Acquired cyst of kidney 2002   Acute sinus infection 11/25/2012   Allergic rhinitis due to pollen    Altered mental status 11/28/2012   Anxiety state, unspecified    Atrioventricular block, complete ([place]) 2003   s/p PPM 2003, generator chnage 2011   Cardiac pacemaker in situ 2003   Coronary atherosclerosis of native coronary artery 2003   non obstructive by Cath 2003   CVA (cerebral infarction) 06/28/2012   rt hemiparesis, dysphagia   Dendritic keratitis 2012   Dysuria    Edema 2003   Esophageal dysmotility 08/19/2012   Food impaction of esophagus 08/15/2012   HTN (hypertension) 06/28/2012   Hypertension    Macular degeneration (senile) of retina, unspecified 2012   Malignant neoplasm of breast (female), unspecified site 1970   S/P Rt radical mastectomy   NSTEMI (non-ST elevated myocardial infarction) ([place]) 06/28/2012   Osteoarthrosis, unspecified whether generalized or localized, unspecified site 2013   Pacemaker    Personal history of colonic polyps     s/p colonoscopy/polypectomy 2003   Pure hyperglyceridemia 2004   Senile osteoporosis 2003   Stroke ([place]) 06/28/2012   Tinnitus 2007   Type II or unspecified type diabetes mellitus without mention of complication, not stated as uncontrolled 2003   Unspecified arthropathy, pelvic region and thigh 2010   Unspecified  glaucoma(365.9) 2013   Unspecified hypothyroidism 2009   Unspecified vitamin D deficiency 2009   Urinary retention 07/07/2012   Past Surgical History:  Procedure Laterality Date   BOTOX INJECTION N/A 10/03/2012   Procedure: BOTOX INJECTION;  Surgeon: [name], MD;  Location: [facility] ENDOSCOPY;  Service: Endoscopy;  Laterality: N/A;    ESOPHAGOGASTRODUODENOSCOPY N/A 08/15/2012   Procedure: ESOPHAGOGASTRODUODENOSCOPY (EGD);  Surgeon: [name], MD;  Location: [facility] ENDOSCOPY;  Service: Endoscopy;  Laterality: N/A;   ESOPHAGOGASTRODUODENOSCOPY N/A 08/15/2012   Procedure: ESOPHAGOGASTRODUODENOSCOPY (EGD);  Surgeon: [name], MD;  Location: [place];  Service: Endoscopy;  Laterality: N/A;   ESOPHAGOGASTRODUODENOSCOPY N/A 10/03/2012   Procedure: ESOPHAGOGASTRODUODENOSCOPY (EGD);  Surgeon: [name], MD;  Location: [facility] ENDOSCOPY;  Service: Endoscopy;  Laterality: N/A;   ESOPHAGOGASTRODUODENOSCOPY N/A 12/22/2012   Procedure: ESOPHAGOGASTRODUODENOSCOPY (EGD);  Surgeon: [name], MD;  Location: [facility] ENDOSCOPY;  Service: Endoscopy;  Laterality: N/A;   ESOPHAGOSCOPY N/A 08/15/2012   Procedure: ESOPHAGOSCOPY;  Surgeon: [name], MD;  Location: [place];  Service: ENT;  Laterality: N/A;   FOREIGN BODY REMOVAL ESOPHAGEAL N/A 08/15/2012   Procedure: REMOVAL FOREIGN BODY ESOPHAGEAL;  Surgeon: [name], MD;  Location: [place];  Service: ENT;  Laterality: N/A;   INSERT / REPLACE / REMOVE PACEMAKER     MASTECTOMY Right 1970   Cancer   SAVORY DILATION N/A 10/03/2012   Procedure: SAVORY DILATION;  Surgeon: [name], MD;  [facility]OS[facility];  Service: Endoscopy;  Laterality: N/A;    Allergies  Allergen Reactions   Augmentin [Amoxicillin-Pot Clavulanate] Nausea And Vomiting   Lactose Intolerance (Gi)    Shingrix [Zoster Vac Recomb Adjuvanted] Rash    Outpatient Encounter Medications as of 12/14/2019  Medication Sig   acetaminophen (TYLENOL) 650 MG CR tablet Take 650 mg by mouth 3 (three) times daily as needed.    bisacodyl (DULCOLAX) 10 MG suppository Place 10 mg rectally daily as needed for moderate constipation.   carboxymethylcellulose (REFRESH PLUS) 0.5 % SOLN 2 drops 2 (two) times daily.    Dentifrices (BIOTENE DRY MOUTH CARE DT) Take 1 spray by mouth 3 (three) times daily as needed.    Docosanol (ABREVA) 10 %  CREA Apply 1 application topically.   escitalopram (LEXAPRO) 20 MG tablet Take 20 mg by mouth daily. For depression/ anxiety.   GLUCERNA (GLUCERNA) LIQD Take 1 Can by mouth daily. Divided 1/2 can with breakfast and 1/2 can at lunch   liver oil-zinc oxide (DESITIN) 40 % ointment Apply 1 application topically as needed for irritation (to buttocks and inner thighs).    LORazepam (ATIVAN) 0.5 MG tablet Take 1 tablet (0.5 mg total) by mouth 2 (two) times daily as needed for anxiety.   LORazepam (ATIVAN) 0.5 MG tablet Take 1 tablet (0.5 mg total) by mouth 2 (two) times daily.   metoprolol tartrate (LOPRESSOR) 25 MG tablet Take 12.5 mg by mouth 2 (two) times daily.    morphine (ROXANOL) 20 MG/ML concentrated solution Take 0.25 mLs (5 mg total) by mouth every 4 (four) hours as needed for severe pain.   omeprazole (PRILOSEC) 20 MG capsule Take 20 mg by mouth daily.   QUEtiapine (SEROQUEL) 25 MG tablet Take 50 mg by mouth 2 (two) times daily.    Witch Hazel 14 % LIQD Apply topically as needed (for hemorrhoid flare up).   [DISCONTINUED] albuterol (VENTOLIN HFA) 108 (90 Base) MCG/ACT inhaler Inhale 2 puffs into the lungs every 6 (six) hours as needed for wheezing  or shortness of breath.   [DISCONTINUED] loperamide (IMODIUM A-D) 2 MG tablet Take 2 mg by mouth 4 (four) times daily as needed for diarrhea or loose stools. 1 tablet after each loose stool. Do not exceed 4 caps in 24 hours as needed   No facility-administered encounter medications on file as of 12/14/2019.    Review of Systems  Constitutional: Positive for activity change. Negative for appetite change, chills, diaphoresis, fatigue, fever and unexpected weight change.  HENT: Positive for trouble swallowing. Negative for congestion.   Respiratory: Negative for cough, shortness of breath and wheezing.   Cardiovascular: Positive for leg swelling. Negative for chest pain and palpitations.  Gastrointestinal: Negative for abdominal  distention, abdominal pain, constipation and diarrhea.  Genitourinary: Negative for difficulty urinating and dysuria.  Musculoskeletal: Positive for gait problem. Negative for arthralgias, back pain, joint swelling and myalgias.  Neurological: Positive for weakness. Negative for dizziness, tremors, seizures, syncope, facial asymmetry, speech difficulty, light-headedness, numbness and headaches.  Psychiatric/Behavioral: Positive for agitation, behavioral problems and confusion. Negative for sleep disturbance and suicidal ideas. The patient is nervous/anxious.     Immunization History  Administered Date(s) Administered   Influenza Inj Mdck Quad Pf 12/22/2015   Influenza Whole 12/17/2012   Influenza, High Dose Seasonal PF 12/18/2018   Influenza,inj,Quad PF,6+ Mos 12/24/2017   Influenza-Unspecified 12/08/2013, 12/16/2014, 12/27/2016   Moderna SARS-COVID-2 Vaccination 03/10/2019, 04/07/2019   PPD Test 07/18/2012   Pneumococcal Conjugate-13 08/27/2015   Pertinent  Health Maintenance Due  Topic Date Due   FOOT EXAM  07/14/2019   INFLUENZA VACCINE  10/04/2019   OPHTHALMOLOGY EXAM  06/28/2020   PNA vac Low Risk Adult  Completed   Fall Risk  06/08/2019 01/02/2019 07/14/2018 04/08/2018 07/10/2017  Falls in the past year? 0 0 0 Exclusion - non ambulatory No  Number falls in past yr: 0 0 0 - -  Injury with Fall? 0 0 0 - -  Risk for fall due to : Impaired mobility;Impaired balance/gait Impaired balance/gait Impaired balance/gait - -  Follow up Falls evaluation completed Falls evaluation completed - - -   Functional Status Survey:    Vitals:   12/17/19 1514  Weight: 172 lb 9.6 oz (78.3 kg)   Body mass index is 32.61 kg/m.  Wt Readings from Last 3 Encounters:  12/17/19 172 lb 9.6 oz (78.3 kg)  11/12/19 172 lb 4.8 oz (78.2 kg)  09/16/19 172 lb 4.8 oz (78.2 kg)    Physical Exam Vitals and nursing note reviewed.  Constitutional:      General: She is not in acute distress.     Appearance: She is not diaphoretic.  HENT:     Head: Normocephalic and atraumatic.     Mouth/Throat:     Mouth: Mucous membranes are moist.     Pharynx: Oropharynx is clear.  Neck:     Vascular: No JVD.  Cardiovascular:     Rate and Rhythm: Normal rate and regular rhythm.     Heart sounds: No murmur heard.   Pulmonary:     Effort: Pulmonary effort is normal. No respiratory distress.     Breath sounds: Rales present. No wheezing.  Abdominal:     General: Bowel sounds are normal.     Palpations: Abdomen is soft.  Musculoskeletal:     Right lower leg: Edema (+1) present.     Left lower leg: Edema (+1) present.  Skin:    General: Skin is warm and dry.  Neurological:     Mental Status: She  is alert.     Comments: Oriented to self and place. Able to f/c.  Confused about the details of her care. Right sided weakness.      Labs reviewed: No results for input(s): NA, K, CL, CO2, GLUCOSE, BUN, CREATININE, CALCIUM, MG, PHOS in the last 8760 hours. No results for input(s): AST, ALT, ALKPHOS, BILITOT, PROT, ALBUMIN in the last 8760 hours. No results for input(s): WBC, NEUTROABS, HGB, HCT, MCV, PLT in the last 8760 hours. Lab Results  Component Value Date   TSH 1.32 10/07/2018   Lab Results  Component Value Date   HGBA1C 7.4 05/07/2019   Lab Results  Component Value Date   CHOL 135 09/19/2015   HDL 50 09/19/2015   LDLCALC 55 09/19/2015   TRIG 149 09/19/2015   CHOLHDL 4.1 07/01/2012    Significant Diagnostic Results in last 30 days:  CUP PACEART REMOTE DEVICE CHECK  Result Date: 12/09/2019 Scheduled remote reviewed. Normal device function.  Next remote 91 days.   Assessment/Plan  1. Chronic diastolic congestive heart failure ([place]) She does not seem to have exacerbated at this time without lasix.  Goals of care are comfort based.   2. Chronic venous insufficiency Compression and elevation are recommended   3. Atrioventricular block, complete [facility]) S/p pacemaker No  further battery changes per family request  4. Esophageal dysmotility Continue puree diet and asp prec No antibiotics, feeding tubes, DNR in place   5. Hemiparesis affecting right side as late effect of cerebrovascular accident [facility]) Noted with no changes  Needs assistance with dressing and bathing, requires skilled care  6. Vascular dementia without behavioral disturbance ([place]) Progressively worse over the past year Continue Seroquel and Ativan for agitation/anxiety  Followed by hospice  Family/ staff Communication: discussed with her nurse  Labs/tests ordered:  NA

## 2019-12-21 NOTE — Progress Notes (Signed)
Remote pacemaker transmission.   

## 2020-01-05 DIAGNOSIS — E1159 Type 2 diabetes mellitus with other circulatory complications: Secondary | ICD-10-CM | POA: Diagnosis not present

## 2020-01-05 DIAGNOSIS — L602 Onychogryphosis: Secondary | ICD-10-CM | POA: Diagnosis not present

## 2020-01-05 DIAGNOSIS — L84 Corns and callosities: Secondary | ICD-10-CM | POA: Diagnosis not present

## 2020-01-18 ENCOUNTER — Non-Acute Institutional Stay (SKILLED_NURSING_FACILITY): Payer: Medicare Other | Admitting: Adult Health

## 2020-01-18 DIAGNOSIS — I442 Atrioventricular block, complete: Secondary | ICD-10-CM | POA: Diagnosis not present

## 2020-01-18 DIAGNOSIS — I5032 Chronic diastolic (congestive) heart failure: Secondary | ICD-10-CM

## 2020-01-18 DIAGNOSIS — K224 Dyskinesia of esophagus: Secondary | ICD-10-CM

## 2020-01-18 DIAGNOSIS — I69351 Hemiplegia and hemiparesis following cerebral infarction affecting right dominant side: Secondary | ICD-10-CM

## 2020-01-18 DIAGNOSIS — E039 Hypothyroidism, unspecified: Secondary | ICD-10-CM

## 2020-01-18 DIAGNOSIS — F0150 Vascular dementia without behavioral disturbance: Secondary | ICD-10-CM

## 2020-01-18 DIAGNOSIS — I872 Venous insufficiency (chronic) (peripheral): Secondary | ICD-10-CM

## 2020-01-20 ENCOUNTER — Encounter: Payer: Self-pay | Admitting: Adult Health

## 2020-01-20 NOTE — Progress Notes (Signed)
Location:  Occupational psychologist of Service:  SNF (31) Provider:   Cindi Carbon, ANP [place] [phone]   [name], DO  Patient Care Team: [name], DO as PCP - General (Geriatric Medicine) Community, Well [name], Altamese Dilling, MD as Attending Physician (Gastroenterology) Garvin Fila, MD as Consulting Physician (Neurology) Julianne Handler, NP as Nurse Practitioner (Hospice and Palliative Medicine) Luberta Mutter, MD as Consulting Physician (Ophthalmology)  Extended Emergency Contact Information Primary Emergency Contact: [place] Address: [address]          [place], [place] [address] [name] of [place] Phone: [phone] Mobile Phone: [phone] Relation: Daughter  Code Status:  DNR Goals of care: Advanced Directive information Advanced Directives 07/07/2019  Does Patient Have a Medical Advance Directive? Yes  Type of Advance Directive Out of facility DNR (pink MOST or yellow form)  Does patient want to make changes to medical advance directive? No - Patient declined  Copy of [place] in Chart? -  Pre-existing out of facility DNR order (yellow form or pink MOST form) -     Chief Complaint  Patient presents with   Medical Management of Chronic Issues    HPI:  Pt is a 86 y.o. female seen today for medical management of chronic diseases.    She had a choking episode on 11/11.  She is not currently coughing and sats are WNL on 2 liters of oxygen. She is having periods of anxiety and increased confusion and takes ativan to help.  She has hx of CHF and is no longer on lasix per family request. Her weight is up by 2 lbs. No signs of increased work of breathing or edema at this time. She is followed by hospice due to progressive dementia and dysphagia. She occasionally skips meals due to lethargy. Has a hx of hypothyroidism but is not currently on meds per family request.  No current symptoms related to discontinuing this med are noted.  Wt Readings from Last 3 Encounters:  01/20/20 174 lb (78.9 kg)  12/17/19 172 lb 9.6 oz (78.3 kg)  11/12/19 172 lb 4.8 oz (78.2 kg)    Past Medical History:  Diagnosis Date   Abnormality of gait 2007   Acquired cyst of kidney 2002   Acute sinus infection 11/25/2012   Allergic rhinitis due to pollen    Altered mental status 11/28/2012   Anxiety state, unspecified    Atrioventricular block, complete ([place]) 2003   s/p PPM 2003, generator chnage 2011   Cardiac pacemaker in situ 2003   Coronary atherosclerosis of native coronary artery 2003   non obstructive by Cath 2003   CVA (cerebral infarction) 06/28/2012   rt hemiparesis, dysphagia   Dendritic keratitis 2012   Dysuria    Edema 2003   Esophageal dysmotility 08/19/2012   Food impaction of esophagus 08/15/2012   HTN (hypertension) 06/28/2012   Hypertension    Macular degeneration (senile) of retina, unspecified 2012   Malignant neoplasm of breast (female), unspecified site 1970   S/P Rt radical mastectomy   NSTEMI (non-ST elevated myocardial infarction) ([place]) 06/28/2012   Osteoarthrosis, unspecified whether generalized or localized, unspecified site 2013   Pacemaker    Personal history of colonic polyps     s/p colonoscopy/polypectomy 2003   Pure hyperglyceridemia 2004   Senile osteoporosis 2003   Stroke ([place]) 06/28/2012   Tinnitus 2007   Type II or unspecified type diabetes mellitus without mention of  complication, not stated as uncontrolled 2003   Unspecified arthropathy, pelvic region and thigh 2010   Unspecified glaucoma(365.9) 2013   Unspecified hypothyroidism 2009   Unspecified vitamin D deficiency 2009   Urinary retention 07/07/2012   Past Surgical History:  Procedure Laterality Date   BOTOX INJECTION N/A 10/03/2012   Procedure: BOTOX INJECTION;  Surgeon: [name], MD;  [facility];  Service: Endoscopy;   Laterality: N/A;   ESOPHAGOGASTRODUODENOSCOPY N/A 08/15/2012   Procedure: ESOPHAGOGASTRODUODENOSCOPY (EGD);  Surgeon: [name], MD;  Location: [facility] ENDOSCOPY;  Service: Endoscopy;  Laterality: N/A;   ESOPHAGOGASTRODUODENOSCOPY N/A 08/15/2012   Procedure: ESOPHAGOGASTRODUODENOSCOPY (EGD);  Surgeon: [name], MD;  Location: [place];  Service: Endoscopy;  Laterality: N/A;   ESOPHAGOGASTRODUODENOSCOPY N/A 10/03/2012   Procedure: ESOPHAGOGASTRODUODENOSCOPY (EGD);  Surgeon: [name], MD;  Location: [facility];  Service: Endoscopy;  Laterality: N/A;   ESOPHAGOGASTRODUODENOSCOPY N/A 12/22/2012   Procedure: ESOPHAGOGASTRODUODENOSCOPY (EGD);  Surgeon: [name], MD;  Location: [facility] ENDOSCOPY;  Service: Endoscopy;  Laterality: N/A;   ESOPHAGOSCOPY N/A 08/15/2012   Procedure: ESOPHAGOSCOPY;  Surgeon: [name], MD;  Location: [place];  Service: ENT;  Laterality: N/A;   FOREIGN BODY REMOVAL ESOPHAGEAL N/A 08/15/2012   Procedure: REMOVAL FOREIGN BODY ESOPHAGEAL;  Surgeon: [name], MD;  Location: [facility];  Service: ENT;  Laterality: N/A;   INSERT / REPLACE / REMOVE PACEMAKER     MASTECTOMY Right 1970   Cancer   SAVORY DILATION N/A 10/03/2012   Procedure: SAVORY DILATION;  Surgeon: [name], MD;  [facility];  Service: Endoscopy;  Laterality: N/A;    Allergies  Allergen Reactions   Augmentin [Amoxicillin-Pot Clavulanate] Nausea And Vomiting   Lactose Intolerance (Gi)    Shingrix [Zoster Vac Recomb Adjuvanted] Rash    Outpatient Encounter Medications as of 01/18/2020  Medication Sig   acetaminophen (TYLENOL) 650 MG CR tablet Take 650 mg by mouth 3 (three) times daily as needed.    atropine 1 % ophthalmic solution 4 drops 4 (four) times daily as needed. For secretions   bisacodyl (DULCOLAX) 10 MG suppository Place 10 mg rectally daily as needed for moderate constipation.   carboxymethylcellulose (REFRESH PLUS) 0.5 % SOLN 2 drops 2 (two) times daily.    Dentifrices  (BIOTENE DRY MOUTH CARE DT) Take 1 spray by mouth 3 (three) times daily as needed.    Docosanol (ABREVA) 10 % CREA Apply 1 application topically.   escitalopram (LEXAPRO) 20 MG tablet Take 20 mg by mouth daily. For depression/ anxiety.   GLUCERNA (GLUCERNA) LIQD Take 1 Can by mouth daily. Divided 1/2 can with breakfast and 1/2 can at lunch   liver oil-zinc oxide (DESITIN) 40 % ointment Apply 1 application topically as needed for irritation (to buttocks and inner thighs).    LORazepam (ATIVAN) 0.5 MG tablet Take 1 tablet (0.5 mg total) by mouth 2 (two) times daily as needed for anxiety.   LORazepam (ATIVAN) 0.5 MG tablet Take 1 tablet (0.5 mg total) by mouth 2 (two) times daily.   metoprolol tartrate (LOPRESSOR) 25 MG tablet Take 12.5 mg by mouth 2 (two) times daily.    morphine (ROXANOL) 20 MG/ML concentrated solution Take 0.25 mLs (5 mg total) by mouth every 4 (four) hours as needed for severe pain.   omeprazole (PRILOSEC) 20 MG capsule Take 20 mg by mouth daily.   QUEtiapine (SEROQUEL) 25 MG tablet Take 50 mg by mouth 2 (two) times daily.    Witch Hazel 14 %  LIQD Apply topically as needed (for hemorrhoid flare up).   No facility-administered encounter medications on file as of 01/18/2020.    Review of Systems  Constitutional: Positive for activity change. Negative for appetite change, chills, diaphoresis, fatigue, fever and unexpected weight change.  HENT: Positive for trouble swallowing. Negative for congestion.   Respiratory: Negative for cough, shortness of breath and wheezing.   Cardiovascular: Positive for leg swelling. Negative for chest pain and palpitations.  Gastrointestinal: Negative for abdominal distention, abdominal pain, constipation and diarrhea.  Genitourinary: Negative for difficulty urinating and dysuria.  Musculoskeletal: Positive for gait problem. Negative for arthralgias, back pain, joint swelling and myalgias.  Neurological: Positive for weakness. Negative  for dizziness, tremors, seizures, syncope, facial asymmetry, speech difficulty, light-headedness, numbness and headaches.  Psychiatric/Behavioral: Positive for agitation, behavioral problems and confusion. Negative for sleep disturbance and suicidal ideas. The patient is nervous/anxious.     Immunization History  Administered Date(s) Administered   Influenza Inj Mdck Quad Pf 12/22/2015   Influenza Whole 12/17/2012   Influenza, High Dose Seasonal PF 12/18/2018   Influenza,inj,Quad PF,6+ Mos 12/24/2017   Influenza-Unspecified 12/08/2013, 12/16/2014, 12/27/2016, 12/29/2019   Moderna SARS-COVID-2 Vaccination 03/10/2019, 04/07/2019   PPD Test 07/18/2012   Pneumococcal Conjugate-13 08/27/2015   Pertinent  Health Maintenance Due  Topic Date Due   OPHTHALMOLOGY EXAM  06/28/2020   INFLUENZA VACCINE  Completed   PNA vac Low Risk Adult  Completed   FOOT EXAM  Discontinued   Fall Risk  06/08/2019 01/02/2019 07/14/2018 04/08/2018 07/10/2017  Falls in the past year? 0 0 0 Exclusion - non ambulatory No  Number falls in past yr: 0 0 0 - -  Injury with Fall? 0 0 0 - -  Risk for fall due to : Impaired mobility;Impaired balance/gait Impaired balance/gait Impaired balance/gait - -  Follow up Falls evaluation completed Falls evaluation completed - - -   Functional Status Survey:    Vitals:   01/20/20 0817  Weight: 174 lb (78.9 kg)   Body mass index is 32.88 kg/m.  Wt Readings from Last 3 Encounters:  01/20/20 174 lb (78.9 kg)  12/17/19 172 lb 9.6 oz (78.3 kg)  11/12/19 172 lb 4.8 oz (78.2 kg)    Physical Exam Vitals and nursing note reviewed.  Constitutional:      General: She is not in acute distress.    Appearance: She is not diaphoretic.  HENT:     Head: Normocephalic and atraumatic.     Mouth/Throat:     Mouth: Mucous membranes are moist.     Pharynx: Oropharynx is clear.  Neck:     Vascular: No JVD.  Cardiovascular:     Rate and Rhythm: Normal rate and regular rhythm.      Heart sounds: No murmur heard.   Pulmonary:     Effort: Pulmonary effort is normal. No respiratory distress.     Breath sounds: Rales present. No wheezing.  Abdominal:     General: Bowel sounds are normal.     Palpations: Abdomen is soft.  Musculoskeletal:     Right lower leg: Edema (+1) present.     Left lower leg: Edema (+1) present.  Skin:    General: Skin is warm and dry.  Neurological:     Mental Status: She is alert.     Comments: Oriented to self and place. Able to f/c.  Confused about the details of her care. Right sided weakness.      Labs reviewed: No results for input(s): NA, K,  CL, CO2, GLUCOSE, BUN, CREATININE, CALCIUM, MG, PHOS in the last 8760 hours. No results for input(s): AST, ALT, ALKPHOS, BILITOT, PROT, ALBUMIN in the last 8760 hours. No results for input(s): WBC, NEUTROABS, HGB, HCT, MCV, PLT in the last 8760 hours. Lab Results  Component Value Date   TSH 1.32 10/07/2018   Lab Results  Component Value Date   HGBA1C 7.4 05/07/2019   Lab Results  Component Value Date   CHOL 135 09/19/2015   HDL 50 09/19/2015   LDLCALC 55 09/19/2015   TRIG 149 09/19/2015   CHOLHDL 4.1 07/01/2012    Significant Diagnostic Results in last 30 days:  No results found.  Assessment/Plan  1. Chronic diastolic congestive heart failure ([place]) Off diuretic therapy Comfort care goals. Would administer morphine if sob.   2. Chronic venous insufficiency Has chronic leg edema, recommend compression if desired.   3. Atrioventricular block, complete ([place]) Has pacemaker but is not planning to change battery  4. Esophageal dysmotility Continues to intermittently have issues.  Continue puree diet.  Comfort feeds.  DNR with no tube feeding.   5. Acquired hypothyroidism Not currently on meds.   6. Vascular dementia without behavioral disturbance ([place]) Progressing with increased confusion. Would not taper serqouel or ativan which would possibly lead to worsening symptoms.  Appreciate hospice support.   7. Hemiparesis affecting right side as late effect of cerebrovascular accident ([place]) Noted.    Family/ staff Communication: discussed with her nurse  Labs/tests ordered:  NA

## 2020-02-03 DEATH — deceased

## 2020-03-05 DEATH — deceased
